# Patient Record
Sex: Male | Born: 1960 | Race: Black or African American | Hispanic: No | Marital: Single | Smoking: Current every day smoker
Health system: Southern US, Community
[De-identification: ages and names within clinical notes are randomized; demographics above are authoritative.]

## PROBLEM LIST (undated history)

## (undated) ENCOUNTER — Emergency Department: Payer: Medicaid Other | Source: Home / Self Care

## (undated) DIAGNOSIS — F0390 Unspecified dementia without behavioral disturbance: Secondary | ICD-10-CM

## (undated) DIAGNOSIS — I4891 Unspecified atrial fibrillation: Secondary | ICD-10-CM

## (undated) DIAGNOSIS — I10 Essential (primary) hypertension: Secondary | ICD-10-CM

## (undated) DIAGNOSIS — F1020 Alcohol dependence, uncomplicated: Secondary | ICD-10-CM

## (undated) DIAGNOSIS — B1920 Unspecified viral hepatitis C without hepatic coma: Secondary | ICD-10-CM

## (undated) DIAGNOSIS — R569 Unspecified convulsions: Secondary | ICD-10-CM

## (undated) DIAGNOSIS — G609 Hereditary and idiopathic neuropathy, unspecified: Secondary | ICD-10-CM

## (undated) DIAGNOSIS — R269 Unspecified abnormalities of gait and mobility: Secondary | ICD-10-CM

## (undated) DIAGNOSIS — I729 Aneurysm of unspecified site: Secondary | ICD-10-CM

## (undated) HISTORY — DX: Unspecified viral hepatitis C without hepatic coma: B19.20

## (undated) HISTORY — PX: CEREBRAL ANEURYSM REPAIR: SHX164

## (undated) HISTORY — DX: Unspecified abnormalities of gait and mobility: R26.9

## (undated) HISTORY — DX: Essential (primary) hypertension: I10

## (undated) HISTORY — DX: Hereditary and idiopathic neuropathy, unspecified: G60.9

---

## 2005-03-04 ENCOUNTER — Emergency Department (HOSPITAL_COMMUNITY): Admission: EM | Admit: 2005-03-04 | Discharge: 2005-03-04 | Payer: Self-pay | Admitting: Emergency Medicine

## 2005-06-26 ENCOUNTER — Emergency Department (HOSPITAL_COMMUNITY): Admission: EM | Admit: 2005-06-26 | Discharge: 2005-06-27 | Payer: Self-pay | Admitting: Emergency Medicine

## 2005-10-25 ENCOUNTER — Inpatient Hospital Stay (HOSPITAL_COMMUNITY): Admission: EM | Admit: 2005-10-25 | Discharge: 2005-10-28 | Payer: Self-pay | Admitting: *Deleted

## 2005-10-25 ENCOUNTER — Ambulatory Visit: Payer: Self-pay | Admitting: *Deleted

## 2005-12-27 ENCOUNTER — Ambulatory Visit: Payer: Self-pay | Admitting: Family Medicine

## 2005-12-29 ENCOUNTER — Ambulatory Visit: Payer: Self-pay | Admitting: *Deleted

## 2006-01-10 ENCOUNTER — Ambulatory Visit: Payer: Self-pay | Admitting: Family Medicine

## 2006-01-26 ENCOUNTER — Ambulatory Visit: Payer: Self-pay | Admitting: Family Medicine

## 2006-02-02 ENCOUNTER — Ambulatory Visit: Payer: Self-pay | Admitting: Family Medicine

## 2006-12-06 ENCOUNTER — Encounter (INDEPENDENT_AMBULATORY_CARE_PROVIDER_SITE_OTHER): Payer: Self-pay | Admitting: *Deleted

## 2007-02-15 ENCOUNTER — Emergency Department (HOSPITAL_COMMUNITY): Admission: EM | Admit: 2007-02-15 | Discharge: 2007-02-15 | Payer: Self-pay | Admitting: Emergency Medicine

## 2007-02-27 ENCOUNTER — Ambulatory Visit: Payer: Self-pay | Admitting: Family Medicine

## 2007-04-02 ENCOUNTER — Ambulatory Visit: Payer: Self-pay | Admitting: Family Medicine

## 2007-04-02 LAB — CONVERTED CEMR LAB
ALT: 107 units/L — ABNORMAL HIGH (ref 0–53)
AST: 170 units/L — ABNORMAL HIGH (ref 0–37)
Albumin: 4.1 g/dL (ref 3.5–5.2)
Alkaline Phosphatase: 88 units/L (ref 39–117)
BUN: 9 mg/dL (ref 6–23)
Basophils Absolute: 0 10*3/uL (ref 0.0–0.1)
Basophils Relative: 1 % (ref 0–1)
Eosinophils Absolute: 0 10*3/uL (ref 0.0–0.7)
MCHC: 33.7 g/dL (ref 30.0–36.0)
MCV: 101.8 fL — ABNORMAL HIGH (ref 78.0–100.0)
Neutro Abs: 0.9 10*3/uL — ABNORMAL LOW (ref 1.7–7.7)
Neutrophils Relative %: 25 % — ABNORMAL LOW (ref 43–77)
Platelets: 187 10*3/uL (ref 150–400)
Potassium: 4.6 meq/L (ref 3.5–5.3)
RDW: 11.9 % (ref 11.5–15.5)
Sodium: 139 meq/L (ref 135–145)
Total Protein: 7.6 g/dL (ref 6.0–8.3)

## 2008-03-25 ENCOUNTER — Emergency Department (HOSPITAL_COMMUNITY): Admission: EM | Admit: 2008-03-25 | Discharge: 2008-03-26 | Payer: Self-pay | Admitting: Emergency Medicine

## 2008-04-02 ENCOUNTER — Emergency Department (HOSPITAL_COMMUNITY): Admission: EM | Admit: 2008-04-02 | Discharge: 2008-04-02 | Payer: Self-pay | Admitting: Family Medicine

## 2008-04-24 ENCOUNTER — Ambulatory Visit: Payer: Self-pay | Admitting: Family Medicine

## 2008-04-25 ENCOUNTER — Encounter (INDEPENDENT_AMBULATORY_CARE_PROVIDER_SITE_OTHER): Payer: Self-pay | Admitting: Family Medicine

## 2008-04-25 LAB — CONVERTED CEMR LAB
AST: 52 units/L — ABNORMAL HIGH (ref 0–37)
Alkaline Phosphatase: 64 units/L (ref 39–117)
BUN: 13 mg/dL (ref 6–23)
Basophils Relative: 0 % (ref 0–1)
Creatinine, Ser: 0.91 mg/dL (ref 0.40–1.50)
Eosinophils Absolute: 0 10*3/uL (ref 0.0–0.7)
MCHC: 33.3 g/dL (ref 30.0–36.0)
MCV: 100.4 fL — ABNORMAL HIGH (ref 78.0–100.0)
Monocytes Absolute: 0.5 10*3/uL (ref 0.1–1.0)
Monocytes Relative: 11 % (ref 3–12)
Neutrophils Relative %: 26 % — ABNORMAL LOW (ref 43–77)
Potassium: 4.2 meq/L (ref 3.5–5.3)
RBC: 4.7 M/uL (ref 4.22–5.81)
RDW: 12.6 % (ref 11.5–15.5)

## 2008-08-21 ENCOUNTER — Ambulatory Visit: Payer: Self-pay | Admitting: Family Medicine

## 2008-08-25 ENCOUNTER — Ambulatory Visit (HOSPITAL_COMMUNITY): Admission: RE | Admit: 2008-08-25 | Discharge: 2008-08-25 | Payer: Self-pay | Admitting: Family Medicine

## 2009-01-23 ENCOUNTER — Ambulatory Visit: Payer: Self-pay | Admitting: Family Medicine

## 2009-01-23 LAB — CONVERTED CEMR LAB
Albumin: 4.2 g/dL (ref 3.5–5.2)
CO2: 24 meq/L (ref 19–32)
Carbamazepine Lvl: 12.4 ug/mL — ABNORMAL HIGH (ref 4.0–12.0)
Glucose, Bld: 115 mg/dL — ABNORMAL HIGH (ref 70–99)
Potassium: 4.2 meq/L (ref 3.5–5.3)
Sodium: 139 meq/L (ref 135–145)
Total Protein: 7.1 g/dL (ref 6.0–8.3)

## 2009-03-06 ENCOUNTER — Ambulatory Visit: Payer: Self-pay | Admitting: Family Medicine

## 2009-04-02 ENCOUNTER — Ambulatory Visit: Payer: Self-pay | Admitting: Internal Medicine

## 2009-04-30 ENCOUNTER — Ambulatory Visit: Payer: Self-pay | Admitting: Family Medicine

## 2009-04-30 LAB — CONVERTED CEMR LAB
Albumin: 3.9 g/dL (ref 3.5–5.2)
Carbamazepine Lvl: 10.9 ug/mL (ref 4.0–12.0)
Total Bilirubin: 0.3 mg/dL (ref 0.3–1.2)

## 2009-05-14 ENCOUNTER — Ambulatory Visit: Payer: Self-pay | Admitting: Family Medicine

## 2009-07-16 ENCOUNTER — Ambulatory Visit: Payer: Self-pay | Admitting: Family Medicine

## 2009-09-22 ENCOUNTER — Ambulatory Visit: Payer: Self-pay | Admitting: Family Medicine

## 2009-09-22 LAB — CONVERTED CEMR LAB
ALT: 109 units/L — ABNORMAL HIGH (ref 0–53)
AST: 230 units/L — ABNORMAL HIGH (ref 0–37)
Alkaline Phosphatase: 106 units/L (ref 39–117)
Basophils Relative: 0 % (ref 0–1)
CO2: 22 meq/L (ref 19–32)
Carbamazepine Lvl: 8 ug/mL (ref 4.0–12.0)
Lymphs Abs: 2.2 10*3/uL (ref 0.7–4.0)
MCHC: 32.7 g/dL (ref 30.0–36.0)
Monocytes Relative: 10 % (ref 3–12)
Neutro Abs: 1.2 10*3/uL — ABNORMAL LOW (ref 1.7–7.7)
Neutrophils Relative %: 31 % — ABNORMAL LOW (ref 43–77)
RBC: 3.75 M/uL — ABNORMAL LOW (ref 4.22–5.81)
Sodium: 142 meq/L (ref 135–145)
Total Bilirubin: 1.2 mg/dL (ref 0.3–1.2)
Total Protein: 7.7 g/dL (ref 6.0–8.3)
WBC: 3.8 10*3/uL — ABNORMAL LOW (ref 4.0–10.5)

## 2009-11-24 ENCOUNTER — Ambulatory Visit: Payer: Self-pay | Admitting: Family Medicine

## 2009-11-24 LAB — CONVERTED CEMR LAB
ALT: 74 units/L — ABNORMAL HIGH (ref 0–53)
Alkaline Phosphatase: 102 units/L (ref 39–117)
Basophils Absolute: 0 10*3/uL (ref 0.0–0.1)
Basophils Relative: 0 % (ref 0–1)
Creatinine, Ser: 0.81 mg/dL (ref 0.40–1.50)
Eosinophils Absolute: 0 10*3/uL (ref 0.0–0.7)
Eosinophils Relative: 1 % (ref 0–5)
HCT: 42.1 % (ref 39.0–52.0)
MCHC: 33.3 g/dL (ref 30.0–36.0)
MCV: 111.1 fL — ABNORMAL HIGH (ref 78.0–100.0)
RDW: 12.4 % (ref 11.5–15.5)
Sodium: 139 meq/L (ref 135–145)
Total Bilirubin: 1.2 mg/dL (ref 0.3–1.2)
Total Protein: 8.1 g/dL (ref 6.0–8.3)

## 2009-12-07 ENCOUNTER — Ambulatory Visit (HOSPITAL_COMMUNITY): Admission: RE | Admit: 2009-12-07 | Discharge: 2009-12-07 | Payer: Self-pay | Admitting: Family Medicine

## 2009-12-10 ENCOUNTER — Encounter (INDEPENDENT_AMBULATORY_CARE_PROVIDER_SITE_OTHER): Payer: Self-pay | Admitting: Family Medicine

## 2009-12-10 LAB — CONVERTED CEMR LAB
Basophils Relative: 0 % (ref 0–1)
Eosinophils Absolute: 0 10*3/uL (ref 0.0–0.7)
Eosinophils Relative: 0 % (ref 0–5)
HCT: 37.8 % — ABNORMAL LOW (ref 39.0–52.0)
HCV Quantitative: 9700000 intl units/mL — ABNORMAL HIGH (ref <43)
MCHC: 33.9 g/dL (ref 30.0–36.0)
MCV: 108 fL — ABNORMAL HIGH (ref 78.0–100.0)
Monocytes Relative: 8 % (ref 3–12)
Neutrophils Relative %: 47 % (ref 43–77)
Platelets: 79 10*3/uL — ABNORMAL LOW (ref 150–400)

## 2010-01-05 ENCOUNTER — Ambulatory Visit (HOSPITAL_COMMUNITY): Admission: RE | Admit: 2010-01-05 | Discharge: 2010-01-05 | Payer: Self-pay | Admitting: Family Medicine

## 2010-03-09 ENCOUNTER — Encounter (INDEPENDENT_AMBULATORY_CARE_PROVIDER_SITE_OTHER): Payer: Self-pay | Admitting: Family Medicine

## 2010-03-09 LAB — CONVERTED CEMR LAB
Basophils Absolute: 0 10*3/uL (ref 0.0–0.1)
Basophils Relative: 0 % (ref 0–1)
Eosinophils Relative: 1 % (ref 0–5)
HCT: 39.5 % (ref 39.0–52.0)
Hemoglobin: 13.3 g/dL (ref 13.0–17.0)
Lymphocytes Relative: 55 % — ABNORMAL HIGH (ref 12–46)
MCHC: 33.7 g/dL (ref 30.0–36.0)
Monocytes Absolute: 0.6 10*3/uL (ref 0.1–1.0)
Platelets: 98 10*3/uL — ABNORMAL LOW (ref 150–400)
RDW: 12.1 % (ref 11.5–15.5)

## 2010-04-15 ENCOUNTER — Ambulatory Visit: Admit: 2010-04-15 | Payer: Self-pay | Admitting: Gastroenterology

## 2010-05-21 ENCOUNTER — Encounter (INDEPENDENT_AMBULATORY_CARE_PROVIDER_SITE_OTHER): Payer: Self-pay | Admitting: Family Medicine

## 2010-05-21 LAB — CONVERTED CEMR LAB
ALT: 31 units/L (ref 0–53)
AST: 34 units/L (ref 0–37)
Basophils Absolute: 0.1 10*3/uL (ref 0.0–0.1)
Basophils Relative: 1 % (ref 0–1)
CO2: 18 meq/L — ABNORMAL LOW (ref 19–32)
Calcium: 9 mg/dL (ref 8.4–10.5)
Carbamazepine Lvl: 10.7 ug/mL (ref 4.0–12.0)
Chloride: 98 meq/L (ref 96–112)
Creatinine, Ser: 1.06 mg/dL (ref 0.40–1.50)
Hemoglobin: 18 g/dL — ABNORMAL HIGH (ref 13.0–17.0)
Lymphocytes Relative: 36 % (ref 12–46)
MCHC: 36.6 g/dL — ABNORMAL HIGH (ref 30.0–36.0)
Monocytes Absolute: 0.7 10*3/uL (ref 0.1–1.0)
Neutro Abs: 5.7 10*3/uL (ref 1.7–7.7)
Neutrophils Relative %: 57 % (ref 43–77)
Platelets: 130 10*3/uL — ABNORMAL LOW (ref 150–400)
Potassium: 4.2 meq/L (ref 3.5–5.3)
RDW: 12.2 % (ref 11.5–15.5)
Sodium: 133 meq/L — ABNORMAL LOW (ref 135–145)
Total Protein: 8.3 g/dL (ref 6.0–8.3)
Vit D, 25-Hydroxy: 22 ng/mL — ABNORMAL LOW (ref 30–89)

## 2010-07-05 LAB — POCT I-STAT, CHEM 8
Calcium, Ion: 1.12 mmol/L (ref 1.12–1.32)
Creatinine, Ser: 0.9 mg/dL (ref 0.4–1.5)
Glucose, Bld: 83 mg/dL (ref 70–99)
Hemoglobin: 14.6 g/dL (ref 13.0–17.0)
Potassium: 3.8 mEq/L (ref 3.5–5.1)

## 2010-08-06 NOTE — Discharge Summary (Signed)
NAME:  ROBERTA, KELLY NO.:  [id number]   MEDICAL RECORD NO.:  [id number]          PATIENT TYPE:  IPS   LOCATION:  0304                          FACILITY:  [facility]   PHYSICIAN:  Jasmine Pang, M.D. DATE OF BIRTH:  1960-10-02   DATE OF ADMISSION:  10/25/2005  DATE OF DISCHARGE:  10/28/2005                                 DISCHARGE SUMMARY   IDENTIFICATION:  The patient was a 50 year old African-American male who was  admitted on a voluntary basis.   HISTORY OF PRESENT ILLNESS:  The patient has a history of polysubstance  abuse and requests detox from alcohol.  He is drinking several quarts of  alcohol daily.  He has impaired ADLs and no motivation for working or other  goals.  He has been using marijuana since the age of 42 and drinking alcohol  since the age of 38, cocaine since his 21s.  He is using cocaine IV.  He had  a seizure last Saturday secondary to not taking his Tegretol.  He denies  suicidal or homicidal ideation.  He denies hallucinations or thought  problems.  This is the first [facility] admission for this patient.  He has a  history of detox in the 84s.  He has been clean and sober for 45 months  while in prison.  For further admission information see psychiatric  admission assessment.   PHYSICAL EXAMINATION:  Physical examination was unremarkable.  This was done  by Lynann Bologna, our nurse practitioner.   LABORATORY DATA:  CBC revealed a WBC of 6.7, hemoglobin of 13, hematocrit of  38.7, platelet count of 197.  Sodium was 134, potassium 4, chloride 102, CO2  28, BUN 14, creatinine 0.9, glucose 96.   HOSPITAL COURSE:  Upon admission, the patient was started on the Librium  detox protocol.  He was also continued on Tegretol 600 mg p.o. b.i.d.  He  was given a nicotine patch 21 mg daily.  He was ordered Symmetrel 100 mg  p.o. b.i.d. due to cocaine use.  The patient tolerated these medications  well with no significant side effects.   Upon admission  the patient stated he was here because he used cocaine and  alcohol.  He had not used for 45 months then relapsed.  He felt depressed  and discouraged about this relapse.  He has had a history of brain  aneurysm approximately 13 years ago.  He has had a seizure disorder since  then and is currently on Tegretol 600 mg p.o. b.i.d.  On October 26, 2005 the  patient discussed his relapse after being 45 months sober.  He began to  drink again and the monster was worse than it ever was.  He states he  ended up being homeless and decided he wanted to be sober again and regain  control of his life.  He is currently homeless.  On October 27, 2005, the  patient was very groggy but had improved mental status.  He discussed his  family.  There is a lot of conflict between family members.  One sister  would not let him come to her  cookout, that is rotten.  That hurt.  He  states he has nieces and nephews that he has not seen.  He feels this has  driven him to addiction.  The plan was for [facility] to [facility]  [place].   On October 28, 2005, the day of discharge, the patient's mental status had  improved markedly.  He was less depressed and anxious.  Affect was wider  range.  There was no suicidal or homicidal ideation.  There was no auditory  or visual hallucinations, no paranoia or delusions.  Thoughts were logical  and goal directed.  Thought content no predominant theme.  Cognitive exam  was grossly within normal limits.   DISCHARGE DIAGNOSES:  AXIS I:  Polysubstance dependence.  AXIS II:  None.  AXIS III:  No diagnosis.  AXIS IV:  Severe (homeless).  AXIS V.  Global assessment of functioning on discharge was 45, global  assessment of functioning upon admission was 38, global assessment of  functioning highest past year was 64.   DISCHARGE PLANS:  The patient had no specific activity level or dietary  restrictions.   DISCHARGE MEDICATIONS:  1. Symmetrel 100 mg p.o. b.i.d.  2. Tegretol  200 mg 3 tablets twice a day.  Tegretol level was 4.6 on      October 25, 2005.   POST HOSPITAL CARE PLAN:  He will follow up with Urgent Care or [facility] within 2 weeks for his seizure disorder.  [facility]:  An appointment has been made for Tuesday, October 28, 2005 at 2 p.m.      Jasmine Pang, M.D.  Electronically Signed     BHS/MEDQ  D:  11/10/2005  T:  11/10/2005  Job:  [id number]

## 2011-07-28 ENCOUNTER — Encounter (HOSPITAL_COMMUNITY): Payer: Self-pay | Admitting: Emergency Medicine

## 2011-07-28 ENCOUNTER — Emergency Department (HOSPITAL_COMMUNITY)
Admission: EM | Admit: 2011-07-28 | Discharge: 2011-07-29 | Disposition: A | Discharge status: Home / Self Care | Payer: Medicaid Other | Attending: Emergency Medicine | Admitting: Emergency Medicine

## 2011-07-28 DIAGNOSIS — G40909 Epilepsy, unspecified, not intractable, without status epilepticus: Secondary | ICD-10-CM

## 2011-07-28 DIAGNOSIS — F1010 Alcohol abuse, uncomplicated: Secondary | ICD-10-CM | POA: Insufficient documentation

## 2011-07-28 DIAGNOSIS — F10929 Alcohol use, unspecified with intoxication, unspecified: Secondary | ICD-10-CM

## 2011-07-28 DIAGNOSIS — R569 Unspecified convulsions: Secondary | ICD-10-CM | POA: Insufficient documentation

## 2011-07-28 HISTORY — DX: Unspecified convulsions: R56.9

## 2011-07-28 HISTORY — DX: Alcohol dependence, uncomplicated: F10.20

## 2011-07-28 LAB — RAPID URINE DRUG SCREEN, HOSP PERFORMED
Amphetamines: NOT DETECTED
Benzodiazepines: NOT DETECTED
Cocaine: NOT DETECTED
Opiates: NOT DETECTED
Tetrahydrocannabinol: NOT DETECTED

## 2011-07-28 LAB — DIFFERENTIAL
Basophils Absolute: 0 10*3/uL (ref 0.0–0.1)
Basophils Relative: 1 % (ref 0–1)
Eosinophils Absolute: 0 10*3/uL (ref 0.0–0.7)
Monocytes Absolute: 0.4 10*3/uL (ref 0.1–1.0)
Monocytes Relative: 9 % (ref 3–12)
Neutrophils Relative %: 33 % — ABNORMAL LOW (ref 43–77)

## 2011-07-28 LAB — CBC
HCT: 35.1 % — ABNORMAL LOW (ref 39.0–52.0)
Hemoglobin: 12.3 g/dL — ABNORMAL LOW (ref 13.0–17.0)
MCH: 36.6 pg — ABNORMAL HIGH (ref 26.0–34.0)
MCHC: 35 g/dL (ref 30.0–36.0)
RDW: 12.3 % (ref 11.5–15.5)

## 2011-07-28 LAB — COMPREHENSIVE METABOLIC PANEL
Albumin: 3.3 g/dL — ABNORMAL LOW (ref 3.5–5.2)
BUN: 8 mg/dL (ref 6–23)
Creatinine, Ser: 0.68 mg/dL (ref 0.50–1.35)
Total Protein: 7.4 g/dL (ref 6.0–8.3)

## 2011-07-28 MED ORDER — THIAMINE HCL 100 MG/ML IJ SOLN: 100.0000 mg | Freq: Every day | INTRAMUSCULAR | Status: DC

## 2011-07-28 MED ORDER — ACETAMINOPHEN 325 MG PO TABS
650.0000 mg | ORAL_TABLET | Freq: Once | ORAL | Status: AC
  Administered 2011-07-28: 650 mg via ORAL
  Filled 2011-07-28: qty 2

## 2011-07-28 NOTE — ED Notes (Signed)
Per EMS, pt found in residential yard passed out, person that called EMS reported possible seizure. Pt was combative with EMS, refused VS and CBG.

## 2011-07-28 NOTE — ED Notes (Signed)
Pt refuses to allow RN to insert IV, starts to become agitated and states, I dont need that".

## 2011-07-28 NOTE — ED Notes (Signed)
Per EMS, pt found passed out in yard, person that called EMS reports ? Seizure. Pt was combative with EMS, refused to allow VS or CBG check. EMS found tegretal in pt possessions.

## 2011-07-28 NOTE — ED Notes (Signed)
EAV:[id number]<JX> Expected date:07/28/11<BR> Expected time:<BR> Means of arrival:<BR> Comments:<BR> EMS 80 GC - etoh/lethargy

## 2011-07-28 NOTE — ED Provider Notes (Signed)
History     CSN: [id number]  Arrival date & time 07/28/11  1654   First MD Initiated Contact with Patient 07/28/11 1655      Chief Complaint  Patient presents with  . Alcohol Intoxication  . Seizures    HPI The patient was found passed out in the yard. A bystander called EMS. Initially when EMS evaluated the patient he was somewhat combative and refused to allow vital signs or blood glucose to be checked. Patient states he's had 3 seizures today. He appears to be heavily intoxicated. Denies any headache, injuries, chest pain, abdominal pain, nausea or vomiting. Patient states he takes Tegretol. He states he has been taking his medications. Past Medical History  Diagnosis Date  . Seizure   . ETOHism     History reviewed. No pertinent past surgical history.  No family history on file.  History  Substance Use Topics  . Smoking status: Not on file  . Smokeless tobacco: Not on file  . Alcohol Use: Yes      Review of Systems  All other systems reviewed and are negative.    Allergies  Review of patient's allergies indicates no known allergies.  Home Medications  No current outpatient prescriptions on file.  There were no vitals taken for this visit.  Physical Exam  Nursing note and vitals reviewed. Constitutional: He appears well-developed and well-nourished. No distress.  HENT:  Head: Normocephalic and atraumatic.  Right Ear: External ear normal.  Left Ear: External ear normal.       Heavy EtOH odor  Eyes: Conjunctivae are normal. Right eye exhibits no discharge. Left eye exhibits no discharge. No scleral icterus.  Neck: Neck supple. No tracheal deviation present.  Cardiovascular: Normal rate, regular rhythm and intact distal pulses.   Pulmonary/Chest: Effort normal and breath sounds normal. No stridor. No respiratory distress. He has no wheezes. He has no rales.  Abdominal: Soft. Bowel sounds are normal. He exhibits no distension. There is no tenderness. There is  no rebound and no guarding.  Musculoskeletal: He exhibits no edema and no tenderness.  Neurological: He is alert. He has normal strength. No cranial nerve deficit ( no gross defecits noted) or sensory deficit. He exhibits normal muscle tone. He displays no seizure activity. Coordination abnormal.       Slurred speech  Skin: Skin is warm and dry. No rash noted.  Psychiatric: He has a normal mood and affect.    ED Course  Procedures (including critical care time)  Labs Reviewed  CBC - Abnormal; Notable for the following:    RBC 3.36 (*)    Hemoglobin 12.3 (*)    HCT 35.1 (*)    MCV 104.5 (*)    MCH 36.6 (*)    All other components within normal limits  DIFFERENTIAL - Abnormal; Notable for the following:    Neutrophils Relative 33 (*)    Neutro Abs 1.4 (*)    Lymphocytes Relative 58 (*)    All other components within normal limits  COMPREHENSIVE METABOLIC PANEL - Abnormal; Notable for the following:    Sodium 132 (*)    Glucose, Bld 110 (*)    Albumin 3.3 (*)    AST 174 (*)    ALT 86 (*)    All other components within normal limits  ETHANOL - Abnormal; Notable for the following:    Alcohol, Ethyl (B) 315 (*)    All other components within normal limits  GLUCOSE, CAPILLARY - Abnormal; Notable for the following:  Glucose-Capillary 116 (*)    All other components within normal limits  URINE RAPID DRUG SCREEN (HOSP PERFORMED)  CARBAMAZEPINE LEVEL, TOTAL   No results found.   1. Alcohol intoxication   2. Seizure disorder       MDM  Patient has history of seizure disorder. His Tegretol level is therapeutic. It is unclear if the patient truly had a seizure or or just passed out from his alcohol intoxication. Regardless he is remained stable here in the emergency department. He does not appear postictal. We will continue to monitor him until he sobers up.  Pt appears to be improving.  Was able to ambulate.       Celene Kras, MD 07/28/11 [phone]

## 2011-07-28 NOTE — Discharge Instructions (Signed)
Alcohol Intoxication Alcohol intoxication means your blood alcohol level is above legal limits. Alcohol is a drug. It has serious side effects. These side effects can include:  Damage to your organs (liver, nervous system, and blood system).   Unclear thinking.   Slowed reflexes.   Decreased muscle coordination.  HOME CARE  Do not drink and drive.   Do not drink alcohol if you are taking medicine or using other drugs. Doing so can cause serious medical problems or even death.   Drink enough water and fluids to keep your pee (urine) clear or pale yellow.   Eat healthy foods.   Only take medicine as told by your doctor.   Join an alcohol support group.  GET HELP RIGHT AWAY IF:  You become shaky when you stop drinking.   Your thinking is unclear or you become confused.   You throw up (vomit) blood. It may look bright red or like coffee grounds.   You notice blood in your poop (bowel movements).   You become lightheaded or pass out (faint).  MAKE SURE YOU:   Understand these instructions.   Will watch your condition.   Will get help right away if you are not doing well or get worse.  Document Released: 08/24/2007 Document Revised: 02/24/2011 Document Reviewed: 08/24/2009 [facility] Patient Information 2012 [place], [place].Seizures You had a seizure. About 2% of the population will have a seizure problem during their lifetime. Sometimes the cause for the seizure is not known. Seizures are usually associated with one of these problems:  Epilepsy.   Not taking your seizure medicine.   Alcohol and drug abuse.   Head injury, strokes, tumors, and brain surgery.   High fever and infections.   Low blood sugar.  Evaluating a new seizure disorder may require having a brain scan or a brain wave test called an EEG. If you have been given a seizure medicine, it is very important that you take it as prescribed. Not taking these medicines as directed is the most common cause of  seizures. Blood tests are often used to be sure you are taking the proper dose.  Seizures cause many different symptoms, from convulsions to brief blackouts. Do not ride a bike, drive a car, go swimming, climb in high or dangerous places such as ladders or roofs, or operate any dangerous equipment until you have your doctor's permission. If you hold a driver's license, state law may require that a report be made to the motor vehicles department. You should wear an emergency medical identification bracelet with information about your seizures. If you have any warning that a seizure may occur, lie down in a safe place to protect yourself. Teach your family and friends what to do if you have any further seizures. They should stay calm and try to keep you from falling on hard or sharp objects. It is best not to try to restrain a seizing person or to force anything into his or her mouth. Do not try to open clenched jaws. When the seizure is over, the person should be rolled on their side to help drain any vomit or secretions from the mouth. After a seizure, a person may be confused or drowsy for several minutes. An ambulance should be called if the seizure lasted more than 5 minutes or if confusion remains for more than 30 minutes. Call your caregiver or the emergency department for further instructions. Do not drive until cleared by your caregiver or neurologist! Document Released: 04/14/2004 Document Revised:  11/17/2010 Document Reviewed: 03/07/2005 [facility] Patient Information 2012 [place], [place].

## 2011-07-28 NOTE — ED Notes (Signed)
Pt refused to allow VS check, pushes RN away.

## 2011-07-29 NOTE — ED Notes (Signed)
Pt given all of his belongings. Stated that he would wait in waiting room till morning for bus.

## 2011-10-07 ENCOUNTER — Emergency Department (HOSPITAL_COMMUNITY)
Admission: EM | Admit: 2011-10-07 | Discharge: 2011-10-08 | Disposition: A | Discharge status: Home / Self Care | Payer: Medicaid Other | Attending: Emergency Medicine | Admitting: Emergency Medicine

## 2011-10-07 ENCOUNTER — Encounter (HOSPITAL_COMMUNITY): Payer: Self-pay | Admitting: *Deleted

## 2011-10-07 DIAGNOSIS — R569 Unspecified convulsions: Secondary | ICD-10-CM

## 2011-10-07 DIAGNOSIS — F10929 Alcohol use, unspecified with intoxication, unspecified: Secondary | ICD-10-CM

## 2011-10-07 DIAGNOSIS — F10229 Alcohol dependence with intoxication, unspecified: Secondary | ICD-10-CM | POA: Insufficient documentation

## 2011-10-07 DIAGNOSIS — G40909 Epilepsy, unspecified, not intractable, without status epilepticus: Secondary | ICD-10-CM | POA: Insufficient documentation

## 2011-10-07 LAB — CBC WITH DIFFERENTIAL/PLATELET
Basophils Absolute: 0 10*3/uL (ref 0.0–0.1)
Eosinophils Absolute: 0 10*3/uL (ref 0.0–0.7)
HCT: 32.7 % — ABNORMAL LOW (ref 39.0–52.0)
Lymphocytes Relative: 65 % — ABNORMAL HIGH (ref 12–46)
MCHC: 34.3 g/dL (ref 30.0–36.0)
Monocytes Relative: 6 % (ref 3–12)
Neutro Abs: 1.3 10*3/uL — ABNORMAL LOW (ref 1.7–7.7)
Neutrophils Relative %: 29 % — ABNORMAL LOW (ref 43–77)
Platelets: 35 10*3/uL — ABNORMAL LOW (ref 150–400)
RDW: 13.3 % (ref 11.5–15.5)
WBC: 4.6 10*3/uL (ref 4.0–10.5)

## 2011-10-07 LAB — BASIC METABOLIC PANEL
Chloride: 100 mEq/L (ref 96–112)
GFR calc Af Amer: >90 mL/min (ref >90)
GFR calc non Af Amer: >90 mL/min (ref >90)
Potassium: 3.9 mEq/L (ref 3.5–5.1)
Sodium: 135 mEq/L (ref 135–145)

## 2011-10-07 LAB — URINALYSIS, ROUTINE W REFLEX MICROSCOPIC
Bilirubin Urine: NEGATIVE
Leukocytes, UA: NEGATIVE
Nitrite: NEGATIVE
Specific Gravity, Urine: 1.015 (ref 1.005–1.030)
Urobilinogen, UA: 2 mg/dL — ABNORMAL HIGH (ref 0.0–1.0)
pH: 5.5 (ref 5.0–8.0)

## 2011-10-07 LAB — RAPID URINE DRUG SCREEN, HOSP PERFORMED
Cocaine: NOT DETECTED
Opiates: NOT DETECTED
Tetrahydrocannabinol: NOT DETECTED

## 2011-10-07 LAB — CARBAMAZEPINE LEVEL, TOTAL: Carbamazepine Lvl: <0.5 ug/mL — ABNORMAL LOW (ref 4.0–12.0)

## 2011-10-07 MED ORDER — SODIUM CHLORIDE 0.9 % IV BOLUS (SEPSIS)
1000.0000 mL | Freq: Once | INTRAVENOUS | Status: AC
  Administered 2011-10-07: 1000 mL via INTRAVENOUS

## 2011-10-07 MED ORDER — CARBAMAZEPINE 200 MG PO TABS
400.0000 mg | ORAL_TABLET | Freq: Once | ORAL | Status: AC
  Administered 2011-10-07: 400 mg via ORAL
  Filled 2011-10-07: qty 2

## 2011-10-07 NOTE — ED Notes (Signed)
Pt had a needle in pants pocket,  Pt was in bathroom smoking,  Education officer, environmental nurse aware and at bedside speaking with pt,  Security also at bedside,  Pt continues to loud and rude to staff.  Pt's belongings taken and placed in bag at bedside pt is aware belongings at bedside.  Pt states he melts his medication and takes it with a needle.

## 2011-10-07 NOTE — ED Notes (Signed)
WUJ:[id number]<JY> Expected date:10/07/11<BR> Expected time: 8:52 PM<BR> Means of arrival:Ambulance<BR> Comments:<BR> Seizure; etoh

## 2011-10-07 NOTE — ED Notes (Signed)
Pt ambulated to bathroom without assistance 

## 2011-10-07 NOTE — ED Notes (Signed)
Per ems pt homeless; history of seizure; not taking tegretol. Pt sitting on bench; found by [employer] laying on ground; pt told [employer] he had had a seizure.  On arrival pt gait unsteady, alert and oriented, speech slurred--pt joking, pleasant and cooperative with staff. No post ictal state witnessed by [employer] or ems.

## 2011-10-07 NOTE — ED Provider Notes (Signed)
History     CSN: [id number]  Arrival date & time 10/07/11  2054   First MD Initiated Contact with Patient 10/07/11 2113      Chief Complaint  Patient presents with  . Seizures    HPI  History provided by the patient. Patient is a 51 year old male with history of alcohol abuse and seizures who presents with reports of seizures early in the day. Patient reports having 2 seizures today. First seizure occurred early in the morning second seizure was later this afternoon and evening. Patient was brought to emergency room by EMS after a phone call. Patient is currently homeless and was in a public bench when [employer] found the patient. There were no reports of postictal confusion. Patient does admit to alcohol use earlier today. Patient states he is normally on Tegretol but has not taken this in the past few days. Patient denies any other complaints at this time. He denies any pain or injury from a fall. Patient states that he commonly has seizures during the day and he normally does not like to come to the emergency room for these. Patient is not have any primary care or neurology followup.    Past Medical History  Diagnosis Date  . Seizure   . ETOHism     History reviewed. No pertinent past surgical history.  No family history on file.  History  Substance Use Topics  . Smoking status: Not on file  . Smokeless tobacco: Not on file  . Alcohol Use: Yes      Review of Systems  Constitutional: Negative for fever and chills.  Respiratory: Negative for cough and shortness of breath.   Cardiovascular: Negative for chest pain.  Gastrointestinal: Negative for nausea, vomiting and abdominal pain.  Neurological: Negative for dizziness, light-headedness and headaches.    Allergies  Review of patient's allergies indicates no known allergies.  Home Medications  No current outpatient prescriptions on file.  BP 143/88  Pulse 76  Temp 97.8 F (36.6 C) (Oral)  Resp 18  SpO2  97%  Physical Exam  Nursing note and vitals reviewed. Constitutional: He is oriented to person, place, and time. He appears well-developed and well-nourished. No distress.  HENT:  Head: Normocephalic and atraumatic.       No bite marks to the  Eyes: Conjunctivae and EOM are normal. Pupils are equal, round, and reactive to light.  Cardiovascular: Normal rate and regular rhythm.   Pulmonary/Chest: Effort normal and breath sounds normal.  Abdominal: Soft. There is no tenderness.  Neurological: He is alert and oriented to person, place, and time. He has normal strength. No sensory deficit.  Skin: Skin is warm.  Psychiatric: He has a normal mood and affect. His behavior is normal.    ED Course  Procedures   Results for orders placed during the hospital encounter of 10/07/11  CARBAMAZEPINE LEVEL, TOTAL      Component Value Range   Carbamazepine Lvl <0.5 (*) 4.0 - 12.0 ug/mL  CBC WITH DIFFERENTIAL      Component Value Range   WBC 4.6  4.0 - 10.5 K/uL   RBC 3.02 (*) 4.22 - 5.81 MIL/uL   Hemoglobin 11.2 (*) 13.0 - 17.0 g/dL   HCT 40.9 (*) 81.1 - 91.4 %   MCV 108.3 (*) 78.0 - 100.0 fL   MCH 37.1 (*) 26.0 - 34.0 pg   MCHC 34.3  30.0 - 36.0 g/dL   RDW 78.2  95.6 - 21.3 %   Platelets 35 (*) 150 -  400 K/uL   Neutrophils Relative 29 (*) 43 - 77 %   Lymphocytes Relative 65 (*) 12 - 46 %   Monocytes Relative 6  3 - 12 %   Eosinophils Relative 0  0 - 5 %   Basophils Relative 0  0 - 1 %   Neutro Abs 1.3 (*) 1.7 - 7.7 K/uL   Lymphs Abs 3.0  0.7 - 4.0 K/uL   Monocytes Absolute 0.3  0.1 - 1.0 K/uL   Eosinophils Absolute 0.0  0.0 - 0.7 K/uL   Basophils Absolute 0.0  0.0 - 0.1 K/uL   Smear Review MORPHOLOGY UNREMARKABLE    BASIC METABOLIC PANEL      Component Value Range   Sodium 135  135 - 145 mEq/L   Potassium 3.9  3.5 - 5.1 mEq/L   Chloride 100  96 - 112 mEq/L   CO2 24  19 - 32 mEq/L   Glucose, Bld 103 (*) 70 - 99 mg/dL   BUN 7  6 - 23 mg/dL   Creatinine, Ser 4.09  0.50 - 1.35 mg/dL    Calcium 8.6  8.4 - 81.1 mg/dL   GFR calc non Af Amer >90  >90 mL/min   GFR calc Af Amer >90  >90 mL/min  URINALYSIS, ROUTINE W REFLEX MICROSCOPIC      Component Value Range   Color, Urine YELLOW  YELLOW   APPearance CLEAR  CLEAR   Specific Gravity, Urine 1.015  1.005 - 1.030   pH 5.5  5.0 - 8.0   Glucose, UA NEGATIVE  NEGATIVE mg/dL   Hgb urine dipstick NEGATIVE  NEGATIVE   Bilirubin Urine NEGATIVE  NEGATIVE   Ketones, ur NEGATIVE  NEGATIVE mg/dL   Protein, ur NEGATIVE  NEGATIVE mg/dL   Urobilinogen, UA 2.0 (*) 0.0 - 1.0 mg/dL   Nitrite NEGATIVE  NEGATIVE   Leukocytes, UA NEGATIVE  NEGATIVE  ETHANOL      Component Value Range   Alcohol, Ethyl (B) 286 (*) 0 - 11 mg/dL  URINE RAPID DRUG SCREEN (HOSP PERFORMED)      Component Value Range   Opiates NONE DETECTED  NONE DETECTED   Cocaine NONE DETECTED  NONE DETECTED   Benzodiazepines NONE DETECTED  NONE DETECTED   Amphetamines NONE DETECTED  NONE DETECTED   Tetrahydrocannabinol NONE DETECTED  NONE DETECTED   Barbiturates NONE DETECTED  NONE DETECTED        1. Seizure   2. Alcohol intoxication       MDM  9:20PM patient seen and evaluated. Patient no acute distress. Patient alert and oriented x3.  Patient observed through the night. No additional seizures. Patient is ambulatory with normal gait. No signs for clinical intoxication at this time. Patient given doses of Tegretol and a prescription to continue medications. Will discharge at this time.      Angus Seller, [place] 10/08/11 [phone]

## 2011-10-07 NOTE — ED Notes (Signed)
Pt states he wants a hot meal and was told he could have one,  I advised pt we had sandwiches and I would ask MD if ok,  Pt stated "that was ridiculous"

## 2011-10-08 MED ORDER — CARBAMAZEPINE ER 200 MG PO TB12: 400.0000 mg | ORAL_TABLET | Freq: Two times a day (BID) | ORAL | Status: DC

## 2011-10-08 MED ORDER — CARBAMAZEPINE 200 MG PO TABS
400.0000 mg | ORAL_TABLET | Freq: Once | ORAL | Status: AC
  Administered 2011-10-08: 400 mg via ORAL
  Filled 2011-10-08: qty 2

## 2011-10-08 MED ORDER — CARBAMAZEPINE ER 200 MG PO TB12: 200.0000 mg | ORAL_TABLET | Freq: Two times a day (BID) | ORAL | Status: DC

## 2011-10-09 NOTE — ED Provider Notes (Signed)
Medical screening examination/treatment/procedure(s) were performed by non-physician practitioner and as supervising physician I was immediately available for consultation/collaboration.   Kyler Lerette, MD 10/09/11 0011 

## 2011-10-18 ENCOUNTER — Encounter (HOSPITAL_COMMUNITY): Payer: Self-pay | Admitting: Emergency Medicine

## 2011-10-18 ENCOUNTER — Emergency Department (HOSPITAL_COMMUNITY)
Admission: EM | Admit: 2011-10-18 | Discharge: 2011-10-19 | Disposition: A | Discharge status: Home / Self Care | Payer: Medicaid Other | Attending: Emergency Medicine | Admitting: Emergency Medicine

## 2011-10-18 DIAGNOSIS — F1010 Alcohol abuse, uncomplicated: Secondary | ICD-10-CM

## 2011-10-18 DIAGNOSIS — D696 Thrombocytopenia, unspecified: Secondary | ICD-10-CM

## 2011-10-18 DIAGNOSIS — G40909 Epilepsy, unspecified, not intractable, without status epilepticus: Secondary | ICD-10-CM | POA: Insufficient documentation

## 2011-10-18 LAB — HEPATIC FUNCTION PANEL
ALT: 55 U/L — ABNORMAL HIGH (ref 0–53)
AST: 139 U/L — ABNORMAL HIGH (ref 0–37)
Albumin: 3.1 g/dL — ABNORMAL LOW (ref 3.5–5.2)
Alkaline Phosphatase: 105 U/L (ref 39–117)
Total Bilirubin: 0.6 mg/dL (ref 0.3–1.2)
Total Protein: 7.1 g/dL (ref 6.0–8.3)

## 2011-10-18 LAB — GLUCOSE, CAPILLARY

## 2011-10-18 LAB — URINALYSIS, ROUTINE W REFLEX MICROSCOPIC
Hgb urine dipstick: NEGATIVE
Nitrite: NEGATIVE
Protein, ur: NEGATIVE mg/dL
Specific Gravity, Urine: 1.015 (ref 1.005–1.030)
Urobilinogen, UA: >8 mg/dL — ABNORMAL HIGH (ref 0.0–1.0)

## 2011-10-18 LAB — CBC
Hemoglobin: 10.5 g/dL — ABNORMAL LOW (ref 13.0–17.0)
Platelets: 28 10*3/uL — CL (ref 150–400)
RBC: 2.75 MIL/uL — ABNORMAL LOW (ref 4.22–5.81)
WBC: 6 10*3/uL (ref 4.0–10.5)

## 2011-10-18 LAB — BASIC METABOLIC PANEL
CO2: 25 mEq/L (ref 19–32)
Chloride: 101 mEq/L (ref 96–112)
Glucose, Bld: 87 mg/dL (ref 70–99)
Potassium: 3.7 mEq/L (ref 3.5–5.1)
Sodium: 136 mEq/L (ref 135–145)

## 2011-10-18 LAB — RAPID URINE DRUG SCREEN, HOSP PERFORMED
Barbiturates: NOT DETECTED
Cocaine: NOT DETECTED
Tetrahydrocannabinol: NOT DETECTED

## 2011-10-18 LAB — PROTIME-INR: Prothrombin Time: 16.9 seconds — ABNORMAL HIGH (ref 11.6–15.2)

## 2011-10-18 LAB — ETHANOL: Alcohol, Ethyl (B): 326 mg/dL — ABNORMAL HIGH (ref 0–11)

## 2011-10-18 MED ORDER — CARBAMAZEPINE ER 400 MG PO TB12
400.0000 mg | ORAL_TABLET | Freq: Three times a day (TID) | ORAL | Status: DC
  Administered 2011-10-18: 400 mg via ORAL
  Filled 2011-10-18 (×4): qty 1

## 2011-10-18 MED ORDER — THIAMINE HCL 100 MG/ML IJ SOLN
Freq: Once | INTRAVENOUS | Status: AC
  Administered 2011-10-18: 22:00:00 via INTRAVENOUS
  Filled 2011-10-18: qty 1000

## 2011-10-18 MED ORDER — SODIUM CHLORIDE 0.9 % IV SOLN
Freq: Once | INTRAVENOUS | Status: AC
  Administered 2011-10-18: 20:00:00 via INTRAVENOUS

## 2011-10-18 NOTE — ED Provider Notes (Signed)
Medical screening examination/treatment/procedure(s) were conducted as a shared visit with non-physician practitioner(s) and myself.  I personally evaluated the patient during the encounter Alcoholic. Hx of sz.  Supposed to take tegretol but doesn't. Had 3 sz today.  Normal now except very drunk.  Will monitor and load with tegretol.  plts low, but since alcoholic plt transfusion not likely to benefit.  Cheri Guppy, MD 10/18/11 2303

## 2011-10-18 NOTE — ED Notes (Signed)
To ED via [facility], hx seizures, had 3-4 seizures today per EMS and patient. Homeless. Unkempt, strong body odor, incontinent of urine,

## 2011-10-18 NOTE — ED Notes (Signed)
Pt states he had another seizure. Urinated and had BM on self and bed. Changed patients linen and gown. Pt is now resting.

## 2011-10-18 NOTE — ED Provider Notes (Signed)
History     CSN: [id number]  Arrival date & time 10/18/11  1901   First MD Initiated Contact with Patient 10/18/11 2022      Chief Complaint  Patient presents with  . Seizures    (Consider location/radiation/quality/duration/timing/severity/associated sxs/prior treatment) HPI  Patient with hx of alcohol abuse and seizure disorder who is poorly compliant to his medications but continues to abuse alcohol and is homeless presents to ER by EMS with report of multiple seizures today. Patient has been seen recently in ER for seizures. Patient admits to drinking alcohol today and states he did not take his medications despite stating that he has both medicaid and disability to aid in getting his prescriptions at [facility]. Patient states he last had a seizure while in ER room stating "I woke up and had peed on myself again" and states that he had been witnessed to have had 3 additional seizures today. Patient has no complaints of pain currently however reports that he has been having nosebleed and bleeding from his gums over the last few weeks. He denies blood in urine or stool. He denies aggravating or alleviating factors. Despite smelling of alcohol and mild slurring of speech, patient is able to answer questions appropriately and mentating appropriately.   Past Medical History  Diagnosis Date  . Seizure   . ETOHism     Past Surgical History  Procedure Date  . Cerebral aneurysm repair     At [facility]    History reviewed. No pertinent family history.  History  Substance Use Topics  . Smoking status: Current Everyday Smoker  . Smokeless tobacco: Not on file  . Alcohol Use: Yes     drin,ks everyday- beer- no liquor      Review of Systems  All other systems reviewed and are negative.    Allergies  Review of patient's allergies indicates no known allergies.  Home Medications   Current Outpatient Rx  Name Route Sig Dispense Refill  . CARBAMAZEPINE ER 200 MG PO  TB12 Oral Take 400 mg by mouth 3 (three) times daily.     Marland Kitchen LOPRESSOR PO Oral Take 1 tablet by mouth daily.      BP 122/69  Pulse 83  Temp 98.9 F (37.2 C) (Oral)  Resp 16  SpO2 94%  Physical Exam  Nursing note and vitals reviewed. Constitutional: He is oriented to person, place, and time. He appears well-developed and well-nourished. No distress.       Poor hygiene and unkept appearing.   HENT:  Head: Normocephalic and atraumatic.  Eyes: Conjunctivae and EOM are normal. Pupils are equal, round, and reactive to light.  Neck: Normal range of motion. Neck supple.  Cardiovascular: Normal rate, regular rhythm, normal heart sounds and intact distal pulses.  Exam reveals no gallop and no friction rub.   No murmur heard. Pulmonary/Chest: Effort normal and breath sounds normal. No respiratory distress. He has no wheezes. He has no rales. He exhibits no tenderness.  Abdominal: Soft. Bowel sounds are normal. He exhibits no distension and no mass. There is no tenderness. There is no rebound and no guarding.  Musculoskeletal: Normal range of motion.  Neurological: He is alert and oriented to person, place, and time.  Skin: Skin is warm and dry. No rash noted. He is not diaphoretic. No erythema.  Psychiatric: He has a normal mood and affect.    ED Course  Procedures (including critical care time)  IV banana bag and fluids.   Patient eating  and drinking in room without difficulty.   PO tegratol ordered.   Labs Reviewed  GLUCOSE, CAPILLARY - Abnormal; Notable for the following:    Glucose-Capillary 101 (*)     All other components within normal limits  ETHANOL - Abnormal; Notable for the following:    Alcohol, Ethyl (B) 326 (*)     All other components within normal limits  URINALYSIS, ROUTINE W REFLEX MICROSCOPIC - Abnormal; Notable for the following:    Color, Urine AMBER (*)  BIOCHEMICALS MAY BE AFFECTED BY COLOR   APPearance CLOUDY (*)     Bilirubin Urine SMALL (*)      Urobilinogen, UA >8.0 (*)     All other components within normal limits  CARBAMAZEPINE LEVEL, TOTAL - Abnormal; Notable for the following:    Carbamazepine Lvl <0.5 (*)     All other components within normal limits  CBC - Abnormal; Notable for the following:    RBC 2.75 (*)     Hemoglobin 10.5 (*)     HCT 30.4 (*)     MCV 110.5 (*)     MCH 38.2 (*)     Platelets 28 (*)     All other components within normal limits  BASIC METABOLIC PANEL - Abnormal; Notable for the following:    Calcium 8.1 (*)     All other components within normal limits  HEPATIC FUNCTION PANEL - Abnormal; Notable for the following:    Albumin 3.1 (*)     AST 139 (*)     ALT 55 (*)     All other components within normal limits  PROTIME-INR - Abnormal; Notable for the following:    Prothrombin Time 16.9 (*)     All other components within normal limits  APTT - Abnormal; Notable for the following:    aPTT 38 (*)     All other components within normal limits  URINE RAPID DRUG SCREEN (HOSP PERFORMED)   No results found.   1. Seizure disorder   2. Alcohol abuse   3. Thrombocytopenia     See Dr Cathlean Sauer note  MDM  Given patient's history of chronic alcohol abuse, we do not feel hospitalization to give platelets would be the best treatment plan. I spoke at length with patient about the need for alcohol cessation, strict seizure medication regimen, and following up closely with primary care and hematology. Patient voices his understanding and is agreeable plan.        [place], [place] 10/19/11 [phone]

## 2011-10-20 NOTE — ED Provider Notes (Signed)
Medical screening examination/treatment/procedure(s) were performed by non-physician practitioner and as supervising physician I was immediately available for consultation/collaboration.  Maki Hege, MD 10/20/11 0018 

## 2011-10-23 ENCOUNTER — Emergency Department (HOSPITAL_COMMUNITY)
Admission: EM | Admit: 2011-10-23 | Discharge: 2011-10-24 | Disposition: A | Discharge status: Home / Self Care | Payer: Medicaid Other | Attending: Emergency Medicine | Admitting: Emergency Medicine

## 2011-10-23 DIAGNOSIS — Z9119 Patient's noncompliance with other medical treatment and regimen: Secondary | ICD-10-CM | POA: Insufficient documentation

## 2011-10-23 DIAGNOSIS — D696 Thrombocytopenia, unspecified: Secondary | ICD-10-CM | POA: Insufficient documentation

## 2011-10-23 DIAGNOSIS — R569 Unspecified convulsions: Secondary | ICD-10-CM | POA: Insufficient documentation

## 2011-10-23 DIAGNOSIS — F1010 Alcohol abuse, uncomplicated: Secondary | ICD-10-CM | POA: Insufficient documentation

## 2011-10-23 DIAGNOSIS — F10929 Alcohol use, unspecified with intoxication, unspecified: Secondary | ICD-10-CM

## 2011-10-23 DIAGNOSIS — F17200 Nicotine dependence, unspecified, uncomplicated: Secondary | ICD-10-CM | POA: Insufficient documentation

## 2011-10-23 DIAGNOSIS — Z91199 Patient's noncompliance with other medical treatment and regimen due to unspecified reason: Secondary | ICD-10-CM | POA: Insufficient documentation

## 2011-10-23 DIAGNOSIS — Z9114 Patient's other noncompliance with medication regimen: Secondary | ICD-10-CM

## 2011-10-23 NOTE — ED Notes (Signed)
Bed:WHALA<BR> Expected date:<BR> Expected time:<BR> Means of arrival:<BR> Comments:<BR> Medic 48, 50 M, Fall, ? Seizure, +ETOH

## 2011-10-23 NOTE — ED Notes (Signed)
Pt unzipped pants and voided on self in the bed. He was assisted to the bathroom by NT to change into dry clothes/ blue paper scrubs. He is speaking loudly and being belligerent to ED staff while they are attempting to assist him.

## 2011-10-23 NOTE — ED Notes (Signed)
Per EMS, pt states he had a seizure. He arrives via EMS reluctant to get on a stretcher. He is a poor historian. Prior records indicated on 10/18/11 for ETOH.  VS: BP: 120/76  HR:70    RR:16      CBG:131  Abrasion noted on lt . Knee by EMS.

## 2011-10-24 LAB — CBC WITH DIFFERENTIAL/PLATELET
Basophils Relative: 0 % (ref 0–1)
Eosinophils Relative: 0 % (ref 0–5)
Hemoglobin: 11.8 g/dL — ABNORMAL LOW (ref 13.0–17.0)
Lymphocytes Relative: 68 % — ABNORMAL HIGH (ref 12–46)
MCH: 38.9 pg — ABNORMAL HIGH (ref 26.0–34.0)
Monocytes Relative: 7 % (ref 3–12)
Neutrophils Relative %: 25 % — ABNORMAL LOW (ref 43–77)
RBC: 3.03 MIL/uL — ABNORMAL LOW (ref 4.22–5.81)
WBC: 5.4 10*3/uL (ref 4.0–10.5)

## 2011-10-24 LAB — COMPREHENSIVE METABOLIC PANEL
ALT: 46 U/L (ref 0–53)
AST: 91 U/L — ABNORMAL HIGH (ref 0–37)
Alkaline Phosphatase: 91 U/L (ref 39–117)
CO2: 25 mEq/L (ref 19–32)
Chloride: 99 mEq/L (ref 96–112)
GFR calc Af Amer: >90 mL/min (ref >90)
GFR calc non Af Amer: >90 mL/min (ref >90)
Glucose, Bld: 101 mg/dL — ABNORMAL HIGH (ref 70–99)
Potassium: 3.7 mEq/L (ref 3.5–5.1)
Sodium: 135 mEq/L (ref 135–145)

## 2011-10-24 LAB — ETHANOL: Alcohol, Ethyl (B): 322 mg/dL — ABNORMAL HIGH (ref 0–11)

## 2011-10-24 LAB — CARBAMAZEPINE LEVEL, TOTAL: Carbamazepine Lvl: 0.8 ug/mL — ABNORMAL LOW (ref 4.0–12.0)

## 2011-10-24 MED ORDER — IBUPROFEN 800 MG PO TABS
800.0000 mg | ORAL_TABLET | Freq: Once | ORAL | Status: AC
Start: 2011-10-24 — End: 2011-10-24
  Administered 2011-10-24: 800 mg via ORAL
  Filled 2011-10-24: qty 1

## 2011-10-24 NOTE — ED Provider Notes (Signed)
History     CSN: [id number]  Arrival date & time 10/23/11  2150   First MD Initiated Contact with Patient 10/23/11 2353      Chief Complaint  Patient presents with  . Seizures  . Alcohol Intoxication    (Consider location/radiation/quality/duration/timing/severity/associated sxs/prior treatment) HPI Comments: 51 year old male with a history of alcohol abuse, seizure disorder who takes carbamazepine. He states that he does not take it regularly because he states "I get arrested has a fine drunk every time I take my medicine". He also drinks heavily every day. He states have been having seizures for 2 days straight, he cannot tell me what the seizures locally, he states was but by telling animals incontinent. He has no evidence of tongue biting on exam. The symptoms are intermittent, no associated fevers chills nausea vomiting diarrhea abdominal pain chest pain shortness breath rashes or swelling.   On arrival the patient was at his pants and urinated on himself in the bed this was not during a seizure.  Patient is a 51 y.o. male presenting with seizures and intoxication. The history is provided by the patient and medical records.  Seizures   Alcohol Intoxication    Past Medical History  Diagnosis Date  . Seizure   . ETOHism     Past Surgical History  Procedure Date  . Cerebral aneurysm repair     At [facility]    No family history on file.  History  Substance Use Topics  . Smoking status: Current Everyday Smoker  . Smokeless tobacco: Not on file  . Alcohol Use: Yes     drin,ks everyday- beer- no liquor      Review of Systems  Neurological: Positive for seizures.  All other systems reviewed and are negative.    Allergies  Review of patient's allergies indicates no known allergies.  Home Medications   Current Outpatient Rx  Name Route Sig Dispense Refill  . CARBAMAZEPINE 200 MG PO TABS Oral Take 400 mg by mouth 3 (three) times daily.      BP 111/72   Pulse 71  Temp 97.9 F (36.6 C) (Oral)  Resp 18  SpO2 96%  Physical Exam  Nursing note and vitals reviewed. Constitutional: He appears well-developed and well-nourished. No distress.  HENT:  Head: Normocephalic and atraumatic.  Mouth/Throat: Oropharynx is clear and moist. No oropharyngeal exudate.  Eyes: Conjunctivae and EOM are normal. Pupils are equal, round, and reactive to light. Right eye exhibits no discharge. Left eye exhibits no discharge. No scleral icterus.  Neck: Normal range of motion. Neck supple. No JVD present. No thyromegaly present.  Cardiovascular: Normal rate, regular rhythm, normal heart sounds and intact distal pulses.  Exam reveals no gallop and no friction rub.   No murmur heard. Pulmonary/Chest: Effort normal and breath sounds normal. No respiratory distress. He has no wheezes. He has no rales.  Abdominal: Soft. Bowel sounds are normal. He exhibits no distension and no mass. There is no tenderness.  Musculoskeletal: Normal range of motion. He exhibits no edema and no tenderness.  Lymphadenopathy:    He has no cervical adenopathy.  Neurological: He is alert. Coordination normal.       Clear speech, moves all strumming is x4, follows commands without difficulty  Skin: Skin is warm and dry. No rash noted. No erythema.  Psychiatric: He has a normal mood and affect. His behavior is normal.    ED Course  Procedures (including critical care time)  Labs Reviewed  ETHANOL - Abnormal;  Notable for the following:    Alcohol, Ethyl (B) 322 (*)     All other components within normal limits  CARBAMAZEPINE LEVEL, TOTAL - Abnormal; Notable for the following:    Carbamazepine Lvl 0.8 (*)     All other components within normal limits  CBC WITH DIFFERENTIAL - Abnormal; Notable for the following:    RBC 3.03 (*)     Hemoglobin 11.8 (*)     HCT 33.6 (*)     MCV 110.9 (*)     MCH 38.9 (*)     Platelets 46 (*)     Neutrophils Relative 25 (*)     Lymphocytes Relative 68  (*)     Neutro Abs 1.4 (*)     All other components within normal limits  COMPREHENSIVE METABOLIC PANEL - Abnormal; Notable for the following:    Glucose, Bld 101 (*)     Albumin 3.4 (*)     AST 91 (*)     All other components within normal limits   No results found.   1. Alcohol intoxication   2. Thrombocytopenia   3. Noncompliance with medications       MDM  The patient smells of alcohol, has normal vital signs, no signs of seizure activity it is not appear to be postictal. Check levels, alcohol, observation.   Alcohol level significantly elevated, patient has his Tegretol, he states that he doesn't like to take it however he does have it and agrees to take it. At this point the patient is clinically sober, I have observed him for 6 hours and he has had no seizures, normal mental status, tolerated oral fluids. His laboratory workup shows that he has a thrombocytopenia, this is chronic, compared with 6 days ago it is increased compared with 2 weeks ago it is the same. The patient is to follow up very closely with his family Dr., and I have given a list of resources phone numbers.   The patient has not had a seizure here, I question if he ever had a seizure before coming in. He is showing no signs of alcohol withdrawal.  Vida Roller, MD 10/24/11 [phone]

## 2011-10-24 NOTE — ED Notes (Signed)
Patient is resting comfortably. 

## 2011-11-04 ENCOUNTER — Emergency Department (HOSPITAL_COMMUNITY)
Admission: EM | Admit: 2011-11-04 | Discharge: 2011-11-04 | Disposition: A | Discharge status: Home / Self Care | Payer: Medicaid Other | Attending: Emergency Medicine | Admitting: Emergency Medicine

## 2011-11-04 DIAGNOSIS — R569 Unspecified convulsions: Secondary | ICD-10-CM | POA: Insufficient documentation

## 2011-11-04 DIAGNOSIS — F17200 Nicotine dependence, unspecified, uncomplicated: Secondary | ICD-10-CM | POA: Insufficient documentation

## 2011-11-04 DIAGNOSIS — D696 Thrombocytopenia, unspecified: Secondary | ICD-10-CM | POA: Insufficient documentation

## 2011-11-04 DIAGNOSIS — G40909 Epilepsy, unspecified, not intractable, without status epilepticus: Secondary | ICD-10-CM

## 2011-11-04 DIAGNOSIS — F10229 Alcohol dependence with intoxication, unspecified: Secondary | ICD-10-CM

## 2011-11-04 LAB — CBC WITH DIFFERENTIAL/PLATELET
Basophils Absolute: 0 10*3/uL (ref 0.0–0.1)
Eosinophils Relative: 0 % (ref 0–5)
HCT: 33.9 % — ABNORMAL LOW (ref 39.0–52.0)
Lymphs Abs: 2 10*3/uL (ref 0.7–4.0)
MCH: 38.9 pg — ABNORMAL HIGH (ref 26.0–34.0)
MCV: 111.9 fL — ABNORMAL HIGH (ref 78.0–100.0)
Monocytes Absolute: 0.3 10*3/uL (ref 0.1–1.0)
Neutro Abs: 2.3 10*3/uL (ref 1.7–7.7)
Platelets: 34 10*3/uL — ABNORMAL LOW (ref 150–400)
RDW: 13.1 % (ref 11.5–15.5)

## 2011-11-04 LAB — BASIC METABOLIC PANEL
BUN: 5 mg/dL — ABNORMAL LOW (ref 6–23)
CO2: 27 mEq/L (ref 19–32)
Calcium: 8.3 mg/dL — ABNORMAL LOW (ref 8.4–10.5)
Chloride: 102 mEq/L (ref 96–112)
Creatinine, Ser: 0.61 mg/dL (ref 0.50–1.35)

## 2011-11-04 LAB — ETHANOL: Alcohol, Ethyl (B): 313 mg/dL — ABNORMAL HIGH (ref 0–11)

## 2011-11-04 MED ORDER — CARBAMAZEPINE 200 MG PO TABS: 400.0000 mg | ORAL_TABLET | Freq: Three times a day (TID) | ORAL | Status: DC

## 2011-11-04 MED ORDER — CARBAMAZEPINE 200 MG PO TABS
400.0000 mg | ORAL_TABLET | Freq: Once | ORAL | Status: AC
  Administered 2011-11-04: 400 mg via ORAL
  Filled 2011-11-04: qty 2

## 2011-11-04 NOTE — Progress Notes (Signed)
[facility] ED CM consulted by ED PA for medication assistance for pt.  This is a medicaid patient who is not a candidate for [facility] indigent medication assistance This was explained to the pt.  While attempting to explain this to the pt, he began to speak loudly to Cm. When Cm asked if he could speak lower he stated he was hard of hearing and apologized for speaking loud.  Pt then began to ventilate his feelings about ED staff not understanding he talked loud because he is hard of hearing, he feels he is "dying" because he had "four seizures today", "I'm sick" "know I had a seizure here" "I take tegretol 1200 mg" "They put the iv in my arm but did not give me the bag" CM allowed pt to ventilate his feelings.  CM discussed triage process for care, nurses have to await  For "bag" of medicine from pharmacy if not standard iv fluids, and encouraged him to discuss his hard of hearing with each staff member if they voiced concern about him speaking loudly.  Pt speaking lower after CM reviewed these areas with him.  Cm then able to review with pt that he is not a candidate for [facility] indigent medication assistance but [facility] (delivers) is the pharmacy [facility] is redirecting patients to. Provided pt with written information on [facility], [email or web address], [facility] outpatient pharmacies and financial resources. Pt inquired about the cost of medicaid medications CM reviewed general cost for most medicaid medications.  Pt voiced understanding and appreciation for resource Cm placed this written information inside of pt belonging bag.

## 2011-11-04 NOTE — ED Provider Notes (Signed)
History     CSN: [id number]  Arrival date & time 11/04/11  1449   First MD Initiated Contact with Patient 11/04/11 1607      Chief Complaint  Patient presents with  . Seizures    (Consider location/radiation/quality/duration/timing/severity/associated sxs/prior treatment) HPI Pt is a 51 yo male who presents to the ED after being found by a bystander lying on the side of the road with his legs twitching. Per EMS report, pt was awake and alert on their arrival with no current seizure activity. He did lose continence of bladder. Pt smelled strongly of alcohol.  Pt is agitated and reports that he had 4 seizures today. He acknowledges noncompliance with Tegretol and says last dose was 2 days ago. Admitted to drinking 24oz beer prior to ED arrival. Says he no longer can get meds for free at [facility] since becoming medicaid eligible.   Past Medical History  Diagnosis Date  . Seizure   . ETOHism     Past Surgical History  Procedure Date  . Cerebral aneurysm repair     At [facility]    No family history on file.  History  Substance Use Topics  . Smoking status: Current Everyday Smoker  . Smokeless tobacco: Not on file  . Alcohol Use: Yes     drin,ks everyday- beer- no liquor      Review of Systems  Respiratory: Negative for shortness of breath.   Cardiovascular: Negative for chest pain.  Gastrointestinal: Positive for diarrhea. Negative for nausea, vomiting and abdominal pain.  Genitourinary: Negative for dysuria.  Musculoskeletal: Negative for arthralgias.  Neurological: Positive for headaches.    Allergies  Review of patient's allergies indicates no known allergies.  Home Medications   Current Outpatient Rx  Name Route Sig Dispense Refill  . CARBAMAZEPINE 200 MG PO TABS Oral Take 400 mg by mouth 3 (three) times daily.      BP 131/70  Pulse 73  Temp 98 F (36.7 C) (Oral)  Resp 18  SpO2 98%  Physical Exam  Constitutional: He is oriented to person,  place, and time.  HENT:  Head: Normocephalic and atraumatic.  Mouth/Throat: Oropharynx is clear and moist.       No bite lesion on tongue.   Eyes: Pupils are equal, round, and reactive to light. Scleral icterus is present.  Neck: Normal range of motion. Neck supple.  Cardiovascular: Normal rate, regular rhythm and normal heart sounds.   Pulmonary/Chest: Effort normal and breath sounds normal. No respiratory distress.  Abdominal: Soft. Bowel sounds are normal. He exhibits no distension. There is no tenderness.  Neurological: He is alert and oriented to person, place, and time. No cranial nerve deficit.  Psychiatric:       Agitated, angry towards staff, demanding food. Accusing staff of being "liars"    ED Course  Procedures (including critical care time)  Labs Reviewed  CBC WITH DIFFERENTIAL - Abnormal; Notable for the following:    RBC 3.03 (*)     Hemoglobin 11.8 (*)     HCT 33.9 (*)     MCV 111.9 (*)     MCH 38.9 (*)     All other components within normal limits  BASIC METABOLIC PANEL - Abnormal; Notable for the following:    BUN 5 (*)     Calcium 8.3 (*)     All other components within normal limits  ETHANOL - Abnormal; Notable for the following:    Alcohol, Ethyl (B) 313 (*)  All other components within normal limits  GLUCOSE, CAPILLARY  CARBAMAZEPINE LEVEL, TOTAL   No results found.   No diagnosis found.    MDM  1. Seizure Pt is EtOH abuser w seizure d/o 2/2 remote cerebral aneurysm bleed. Noncompliant w tegretol 400mg  TID. No recurrent sz during admission, pt intoxicated (EtOH level 313) w no s/s of withdrawal. Tegretol level undetectable. Gave tegretol 400mg . Pt was A+Ox3 and ambulating without difficulty at time of discharge.  - CSW saw to discuss possibilities for obtaining medications. Pt agreed to fill and take medications. Script for 1 mo worth of Tegretol 400mg  TID was given.   2. Thrombocytopenia Chronic, stable today. No signs of active bleeding. No  transfusions given.       Bronson Curb, MD 11/04/11 2153

## 2011-11-04 NOTE — ED Provider Notes (Signed)
Reports had 4 seizures today. Noncompliant with Tegretol. Admits to drinking alcohol today. Patient alert Glasgow Coma Score 15 moves all extremities cranial nerves II through XII grossly intact  Doug Sou, MD 11/04/11 1717

## 2011-11-04 NOTE — ED Provider Notes (Signed)
I have personally seen and examined the patient.  I have discussed the plan of care with the resident.  I have reviewed the documentation on PMH/FH/Soc. History.  I have reviewed the documentation of the resident and agree.  Doug Sou, MD 11/04/11 [phone]

## 2011-11-04 NOTE — ED Notes (Addendum)
Pt found by bystander on side of the road w/legs twitching.  Upon arrival by EMS, pt was awake and alert.  He had been incontinent of urine and reported hx of seizures.  CBG 150.  Pt is homeless and smells or urine, stool and ETOH.  Pt denies ETOH abuse.  EMS vitals 108/66, 74, 98%RA.

## 2011-11-04 NOTE — ED Notes (Signed)
RN to obtain labs with start of IV 

## 2011-11-14 ENCOUNTER — Emergency Department (HOSPITAL_COMMUNITY)
Admission: EM | Admit: 2011-11-14 | Discharge: 2011-11-14 | Disposition: A | Discharge status: Home / Self Care | Payer: Medicaid Other | Attending: Emergency Medicine | Admitting: Emergency Medicine

## 2011-11-14 ENCOUNTER — Encounter (HOSPITAL_COMMUNITY): Payer: Self-pay | Admitting: Emergency Medicine

## 2011-11-14 DIAGNOSIS — Z765 Malingerer [conscious simulation]: Secondary | ICD-10-CM | POA: Insufficient documentation

## 2011-11-14 DIAGNOSIS — F445 Conversion disorder with seizures or convulsions: Secondary | ICD-10-CM

## 2011-11-14 DIAGNOSIS — F1020 Alcohol dependence, uncomplicated: Secondary | ICD-10-CM

## 2011-11-14 DIAGNOSIS — F17200 Nicotine dependence, unspecified, uncomplicated: Secondary | ICD-10-CM | POA: Insufficient documentation

## 2011-11-14 DIAGNOSIS — R569 Unspecified convulsions: Secondary | ICD-10-CM

## 2011-11-14 LAB — POCT I-STAT, CHEM 8
BUN: 4 mg/dL — ABNORMAL LOW (ref 6–23)
Calcium, Ion: 1.12 mmol/L (ref 1.12–1.23)
Chloride: 103 mEq/L (ref 96–112)
Glucose, Bld: 119 mg/dL — ABNORMAL HIGH (ref 70–99)
TCO2: 23 mmol/L (ref 0–100)

## 2011-11-14 MED ORDER — LORAZEPAM 1 MG PO TABS: 1.0000 mg | ORAL_TABLET | Freq: Three times a day (TID) | ORAL | Status: AC | PRN

## 2011-11-14 NOTE — ED Notes (Signed)
Pt very uncooperative and verbally abusing to the nursing and medical staff.Security called.Pt refusing to sign discharge and wanting to discharge himself.

## 2011-11-14 NOTE — ED Notes (Signed)
Pt refusing to sign disharge instruction.Verbaly abusive towards medical and nursing staff.

## 2011-11-14 NOTE — ED Notes (Signed)
Pt  Brought to ED by EMS with the complaint of seizures.Pt says that he has missed his seizure medication.

## 2011-11-14 NOTE — ED Provider Notes (Signed)
History     CSN: [id number]  Arrival date & time 11/14/11  1478   First MD Initiated Contact with Patient 11/14/11 1949      Chief Complaint  Patient presents with  . Seizures    (Consider location/radiation/quality/duration/timing/severity/associated sxs/prior treatment) HPI Comments: [name] [name] 51 y.o. male   The chief complaint is: Patient presents with:   Seizures   The patient has medical history significant for:   Past Medical History:   Seizure                                                      ETOHism                                                     Patient with history of alcoholism, thrombocytopenia, and seizures presents stating that he has had 4 seizures today and has not been compliant to his tegretol because he now qualifies for medicaid and can no longer get free medications. Patient stated that he had a seizure outside and a "lady" called EMS. Patient has presented to ED multiple times with the same complaint. Specifically 10/07/11, 10/18/11, 10/24/11, 11/04/11,smelling of ETOH, and demanding food. During these prior visits his ETOH has consistently been in the 300's. Denies fever or chills. Denies NVD or abdominal pain. Denies head trauma or injury.      The history is provided by the patient.    Past Medical History  Diagnosis Date  . Seizure   . ETOHism     Past Surgical History  Procedure Date  . Cerebral aneurysm repair     At [facility]    No family history on file.  History  Substance Use Topics  . Smoking status: Current Everyday Smoker  . Smokeless tobacco: Not on file  . Alcohol Use: Yes     drin,ks everyday- beer- no liquor      Review of Systems  Constitutional: Negative for fever, chills and diaphoresis.  Gastrointestinal: Negative for nausea, vomiting and diarrhea.  Neurological: Positive for seizures.    Allergies  Review of patient's allergies indicates no known allergies.  Home Medications   Current  Outpatient Rx  Name Route Sig Dispense Refill  . CARBAMAZEPINE 200 MG PO TABS Oral Take 2 tablets (400 mg total) by mouth 3 (three) times daily. 180 tablet 0  . LOPRESSOR PO Oral Take 1 tablet by mouth 3 (three) times daily.    Marland Kitchen PRESCRIPTION MEDICATION Oral Take 1 capsule by mouth 3 (three) times daily. Additional seizure medication.      BP 109/69  Pulse 76  Temp 98.1 F (36.7 C) (Oral)  Resp 20  SpO2 95%  Physical Exam  ED Course  Procedures (including critical care time)  Labs Reviewed - No data to display No results found.   1. Pseudoseizures   2. Alcoholism   3. Malingering       MDM  Patient has presented 4-5 times to the ED with complaint of multiple seizures. EMS arrives and states that there is not seizure activity or post-ictal state. On these visits patient is found to be intoxicated, seizure medication nontherapetic, and requesting food. I stat  done and patient labs stable. Patient given [place] sandwich and discharged with resource list to establish primary care. Patient became agitated and argumentative stating he had a seizure and we did not give him adequate care. I believe this behavior to be malingering, and that he fakes seizures for admission, shelter, and free food.Return precautions given.        Pixie Casino, PA-C 11/14/11 2256

## 2011-11-16 ENCOUNTER — Emergency Department (HOSPITAL_COMMUNITY): Payer: Medicaid Other

## 2011-11-16 ENCOUNTER — Encounter (HOSPITAL_COMMUNITY): Payer: Self-pay | Admitting: Emergency Medicine

## 2011-11-16 ENCOUNTER — Emergency Department (HOSPITAL_COMMUNITY)
Admission: EM | Admit: 2011-11-16 | Discharge: 2011-11-17 | Disposition: A | Discharge status: Home / Self Care | Payer: Medicaid Other | Attending: Emergency Medicine | Admitting: Emergency Medicine

## 2011-11-16 DIAGNOSIS — F445 Conversion disorder with seizures or convulsions: Secondary | ICD-10-CM

## 2011-11-16 DIAGNOSIS — Z79899 Other long term (current) drug therapy: Secondary | ICD-10-CM | POA: Insufficient documentation

## 2011-11-16 DIAGNOSIS — F1020 Alcohol dependence, uncomplicated: Secondary | ICD-10-CM | POA: Insufficient documentation

## 2011-11-16 DIAGNOSIS — Z765 Malingerer [conscious simulation]: Secondary | ICD-10-CM | POA: Insufficient documentation

## 2011-11-16 DIAGNOSIS — F17200 Nicotine dependence, unspecified, uncomplicated: Secondary | ICD-10-CM | POA: Insufficient documentation

## 2011-11-16 DIAGNOSIS — F1410 Cocaine abuse, uncomplicated: Secondary | ICD-10-CM

## 2011-11-16 DIAGNOSIS — Z91199 Patient's noncompliance with other medical treatment and regimen due to unspecified reason: Secondary | ICD-10-CM | POA: Insufficient documentation

## 2011-11-16 DIAGNOSIS — Z9119 Patient's noncompliance with other medical treatment and regimen: Secondary | ICD-10-CM | POA: Insufficient documentation

## 2011-11-16 DIAGNOSIS — R569 Unspecified convulsions: Secondary | ICD-10-CM | POA: Insufficient documentation

## 2011-11-16 LAB — CARBAMAZEPINE LEVEL, TOTAL: Carbamazepine Lvl: <0.5 ug/mL — ABNORMAL LOW (ref 4.0–12.0)

## 2011-11-16 LAB — BASIC METABOLIC PANEL
Calcium: 8.5 mg/dL (ref 8.4–10.5)
Creatinine, Ser: 0.6 mg/dL (ref 0.50–1.35)
GFR calc Af Amer: >90 mL/min (ref >90)
GFR calc non Af Amer: >90 mL/min (ref >90)

## 2011-11-16 LAB — RAPID URINE DRUG SCREEN, HOSP PERFORMED: Opiates: NOT DETECTED

## 2011-11-16 LAB — ETHANOL: Alcohol, Ethyl (B): 221 mg/dL — ABNORMAL HIGH (ref 0–11)

## 2011-11-16 MED ORDER — LORAZEPAM 2 MG/ML IJ SOLN
2.0000 mg | Freq: Once | INTRAMUSCULAR | Status: AC
  Administered 2011-11-16: 2 mg via INTRAVENOUS
  Filled 2011-11-16: qty 1

## 2011-11-16 NOTE — ED Notes (Signed)
Patient transported to X-ray 

## 2011-11-16 NOTE — ED Notes (Signed)
Pt BIB EMS. Pt had witnessed sz lying on sidewalk. Pt has hx of seizures. Pt was picked up on Monday for same by EMS. Pt arrives a/o x 3 asking for something to eat. Pt was incontinent of urine at scene.

## 2011-11-16 NOTE — ED Notes (Signed)
Pt found to have incontinent of bowel and bladder in bed, pt states he tried to call. Pt states he is usually continent, only incontinent when he has seizure. Pt A & O. Seizure pads in place. Pt stood at sink and bathed himself with staff at bedside while linens changed.

## 2011-11-16 NOTE — ED Notes (Signed)
Pt. Has seizure pad on bed refuse to get in is sitting in a chair eating

## 2011-11-17 MED ORDER — CARBAMAZEPINE 200 MG PO TABS: 400.0000 mg | ORAL_TABLET | Freq: Three times a day (TID) | ORAL | Status: DC

## 2011-11-17 NOTE — ED Provider Notes (Signed)
History     CSN: [id number]  Arrival date & time 11/16/11  1827   First MD Initiated Contact with Patient 11/16/11 1907      Chief Complaint  Patient presents with  . Seizures    (Consider location/radiation/quality/duration/timing/severity/associated sxs/prior treatment) HPI [name] [name] is a 51 y.o. male who presents with complaint of "seizure". Patient says a bystander witnessed him having a seizure on the sidewalk and when he came out of the seizure he didn't want to person to call 911 but they did. Patient does not seem postictal at this time. He is currently alert and oriented. He is complaining about pain would like some pain medicine and would like something to eat. There is report of him being incontinent of urine at the scene. Patient appears mildly intoxicated, slurring of speech but is otherwise alert and oriented. He denies any other drug use. He denies taking his carbamazepine because he says he cannot afford it.  Past Medical History  Diagnosis Date  . Seizure   . ETOHism     Past Surgical History  Procedure Date  . Cerebral aneurysm repair     At [facility]    No family history on file.  History  Substance Use Topics  . Smoking status: Current Everyday Smoker  . Smokeless tobacco: Not on file  . Alcohol Use: Yes     drin,ks everyday- beer- no liquor      Review of Systems positive for reports of seizure, Patient denies any fevers or chills, changes in vision, earache, sore throat, neck pain or stiffness, chest pain or pressure, palpitations, syncope, dyspnea, cough, wheezing,  abdominal pain, nausea, vomiting, diarrhea, melena, red bloody stools, frequency, dysuria, myalgias, arthralgias, back pain, recent trauma, rash, itching, skin lesions, easy bruising or bleeding, headache, numbness, tingling or weakness and denies depression, and anxiety.    Allergies  Review of patient's allergies indicates no known allergies.  Home Medications    Current Outpatient Rx  Name Route Sig Dispense Refill  . CARBAMAZEPINE 200 MG PO TABS Oral Take 2 tablets (400 mg total) by mouth 3 (three) times daily. 180 tablet 0  . LORAZEPAM 1 MG PO TABS Oral Take 1 tablet (1 mg total) by mouth 3 (three) times daily as needed for anxiety. 15 tablet 0  . LOPRESSOR PO Oral Take 1 tablet by mouth 3 (three) times daily.    Marland Kitchen PRESCRIPTION MEDICATION Oral Take 1 capsule by mouth 3 (three) times daily. Additional seizure medication.    Marland Kitchen CARBAMAZEPINE 200 MG PO TABS Oral Take 2 tablets (400 mg total) by mouth 3 (three) times daily. 84 tablet 0    BP 141/84  Pulse 91  Temp 98 F (36.7 C) (Oral)  Resp 17  SpO2 95%  Physical Exam PHYSICAL EXAM: VITAL SIGNS:   Filed Vitals:   11/16/11 2228  BP: 141/84  Pulse: 91  Temp:   Resp: 17   CONSTITUTIONAL: Awake, oriented, appears non-toxic HENT: Atraumatic, normocephalic, oral mucosa pink and moist, airway patent. Poor dentition Nares patent without drainage. External ears normal. EYES: Conjunctiva clear, EOMI, PERRLA NECK: Trachea midline, non-tender, supple CARDIOVASCULAR: Normal heart rate, Normal rhythm, No murmurs, rubs, gallops PULMONARY/CHEST: Clear to auscultation, no rhonchi, wheezes. Symmetrical breath sounds. CHEST WALL: No lesions. Non-tender. ABDOMINAL: Non-distended, soft, non-tender - no rebound or guarding.  BS normal. NEUROLOGIC: MV:HQIONG fields intact. PERRLA, EOMI.  Facial sensation equal to light touch bilaterally.  Good muscle bulk in the masseter muscle and good lateral movement of  the jaw.  Facial expressions equal and good strength with smile/frown and puffed cheeks.  Hearing grossly intact to finger rub test.  Uvula, tongue are midline with no deviation. Symmetrical palate elevation.  Trapezius and SCM muscles are 5/5 strength bilaterally.   DTR: Brachioradialis, biceps, patellar, Achilles tendon reflexes 2+ bilaterally.  No clonus. Strength: 5/5 strength flexors and extensors in  the upper and lower extremities.  Grip strength, finger adduction/abduction 5/5. Sensation: Sensation intact distally to light touch Cerebellar: No ataxia with walking or dysmetria with finger to nose, rapid alternating hand movements and heels to shin testing. EXTREMITIES: No clubbing, cyanosis, or edema SKIN: Warm, Dry, No erythema, No rash   ED Course  Procedures (including critical care time)  Labs Reviewed  CARBAMAZEPINE LEVEL, TOTAL - Abnormal; Notable for the following:    Carbamazepine Lvl <0.5 (*)     All other components within normal limits  ETHANOL - Abnormal; Notable for the following:    Alcohol, Ethyl (B) 221 (*)     All other components within normal limits  BASIC METABOLIC PANEL - Abnormal; Notable for the following:    Glucose, Bld 102 (*)     BUN 5 (*)     All other components within normal limits  URINE RAPID DRUG SCREEN (HOSP PERFORMED) - Abnormal; Notable for the following:    Cocaine POSITIVE (*)     All other components within normal limits   Dg Chest 2 View  11/16/2011  *RADIOLOGY REPORT*  Clinical Data: Seizures.  Wheezing.  CHEST - 2 VIEW  Comparison: None.  Findings: Lungs are clear.  Heart size is upper normal.  No pneumothorax or pleural fluid.  Degenerative change is present about the shoulders.  Synostosis between the right six and seventh ribs is incidentally noted.  IMPRESSION: No acute finding.   Original Report Authenticated By: Bernadene Bell. D'ALESSIO, M.D.      1. Malingering   2. Pseudoseizure   3. Alcoholism   4. Cocaine abuse       MDM  [name] [name] is a 51 y.o. male with a past medical history of seizures, reporting 4 seizures today and noncompliance of medication presents again to the emergency department as he has 4 times in the last month.  This is a similar story to that when he gave last time we smells of alcohol and says that he had 4 seizures during the day, had a bystander called EMS he did not even want to, he arrives  demanding food and pain medicine.   was not postictal and immediately started eating some take out food he had in the bag when he got to the ER. Again I think the patient had a seizure today. I do not think a semi-normal neurologic exam that the patient warrants any neuroimaging today.  Ethanol was positive at 221 his carbamazepine is undetectable and his urine drug screen is positive for cocaine. I discussed with the patient that this emergency department can not help him if he continues to abuse alcohol take illicit drugs and not take his seizure medicine. Furthermore noted he's taking his seizure medicine because he is choosing to spend his money on alcohol, tobacco and cocaine. Patient says he used to get his drugs free at the free clinic closed. I've written him a prescription for 2 weeks supply of carbamazepine to use the resources he has been provided to obtain his prescriptions.  I explained the diagnosis in detail and have given standard ER return precautions. The patient  understands and accepts the medical plan as it's been dictated and I have answered all questions. Discharge instructions concerning home care and prescriptions have been given.  The patient is STABLE and is discharged to home in good condition.     Jones Skene, MD 11/17/11 1610

## 2011-11-17 NOTE — ED Provider Notes (Signed)
Medical screening examination/treatment/procedure(s) were conducted as a shared visit with non-physician practitioner(s) and myself.  I personally evaluated the patient during the encounter.  No seizure activity emergency department. Neuro exam normal  Donnetta Hutching, MD 11/17/11 2119

## 2011-12-17 ENCOUNTER — Encounter (HOSPITAL_COMMUNITY): Payer: Self-pay | Admitting: Emergency Medicine

## 2011-12-17 ENCOUNTER — Emergency Department (HOSPITAL_COMMUNITY)
Admission: EM | Admit: 2011-12-17 | Discharge: 2011-12-18 | Disposition: A | Discharge status: Home / Self Care | Payer: Medicaid Other | Attending: Emergency Medicine | Admitting: Emergency Medicine

## 2011-12-17 DIAGNOSIS — G319 Degenerative disease of nervous system, unspecified: Secondary | ICD-10-CM | POA: Insufficient documentation

## 2011-12-17 DIAGNOSIS — M542 Cervicalgia: Secondary | ICD-10-CM | POA: Insufficient documentation

## 2011-12-17 DIAGNOSIS — F10929 Alcohol use, unspecified with intoxication, unspecified: Secondary | ICD-10-CM

## 2011-12-17 DIAGNOSIS — F1010 Alcohol abuse, uncomplicated: Secondary | ICD-10-CM | POA: Insufficient documentation

## 2011-12-17 DIAGNOSIS — R569 Unspecified convulsions: Secondary | ICD-10-CM | POA: Insufficient documentation

## 2011-12-17 DIAGNOSIS — R51 Headache: Secondary | ICD-10-CM | POA: Insufficient documentation

## 2011-12-17 NOTE — ED Notes (Signed)
WUJ:[id number]<JY> Expected date:12/17/11<BR> Expected time:10:34 PM<BR> Means of arrival:Ambulance<BR> Comments:<BR> Res B: ETOH

## 2011-12-17 NOTE — ED Notes (Signed)
Per EMS, pt found lying on the ground in a parking lot. Smells of ETOH. Pt is combative and c/o "left ear leaking" and that he has had three seizures today. Takes Tegretol for seizures but has not had any in 3 days.

## 2011-12-18 ENCOUNTER — Emergency Department (HOSPITAL_COMMUNITY): Payer: Medicaid Other

## 2011-12-18 LAB — CBC
HCT: 38.9 % — ABNORMAL LOW (ref 39.0–52.0)
Hemoglobin: 13.9 g/dL (ref 13.0–17.0)
MCV: 105.7 fL — ABNORMAL HIGH (ref 78.0–100.0)
Platelets: 79 10*3/uL — ABNORMAL LOW (ref 150–400)
RBC: 3.68 MIL/uL — ABNORMAL LOW (ref 4.22–5.81)
WBC: 4.2 10*3/uL (ref 4.0–10.5)

## 2011-12-18 LAB — COMPREHENSIVE METABOLIC PANEL
AST: 123 U/L — ABNORMAL HIGH (ref 0–37)
BUN: 4 mg/dL — ABNORMAL LOW (ref 6–23)
CO2: 30 mEq/L (ref 19–32)
Chloride: 97 mEq/L (ref 96–112)
Creatinine, Ser: 0.52 mg/dL (ref 0.50–1.35)
GFR calc non Af Amer: >90 mL/min (ref >90)
Total Bilirubin: 0.3 mg/dL (ref 0.3–1.2)

## 2011-12-18 LAB — RAPID URINE DRUG SCREEN, HOSP PERFORMED
Amphetamines: NOT DETECTED
Tetrahydrocannabinol: NOT DETECTED

## 2011-12-18 LAB — PROTIME-INR
INR: 1.4 (ref 0.00–1.49)
Prothrombin Time: 16.8 seconds — ABNORMAL HIGH (ref 11.6–15.2)

## 2011-12-18 LAB — SALICYLATE LEVEL: Salicylate Lvl: <2 mg/dL — ABNORMAL LOW (ref 2.8–20.0)

## 2011-12-18 MED ORDER — CARBAMAZEPINE 200 MG PO TABS: 400.0000 mg | ORAL_TABLET | Freq: Three times a day (TID) | ORAL | Status: DC

## 2011-12-18 NOTE — ED Provider Notes (Signed)
History     CSN: [id number]  Arrival date & time 12/17/11  2248   First MD Initiated Contact with Patient 12/17/11 2305      Chief Complaint  Patient presents with  . Medical Clearance    (Consider location/radiation/quality/duration/timing/severity/associated sxs/prior treatment) HPI Pt has been in the ED multiple times for similar complaints. Was found lying on ground by passerby and EMS was called. Pt combative and intoxicated. States he had 3 seizures today and has been out of his tegretol. Complains of head and neck pain without obvious or known injury. States his left ear feel "squishy" and thinks he is bleeding from it.  Past Medical History  Diagnosis Date  . Seizure   . ETOHism     Past Surgical History  Procedure Date  . Cerebral aneurysm repair     At [facility]    History reviewed. No pertinent family history.  History  Substance Use Topics  . Smoking status: Current Every Day Smoker  . Smokeless tobacco: Not on file  . Alcohol Use: Yes     drin,ks everyday- beer- no liquor      Review of Systems  Constitutional: Negative for fever and chills.  HENT: Positive for neck pain. Negative for neck stiffness.   Respiratory: Negative for shortness of breath.   Cardiovascular: Negative for chest pain.  Gastrointestinal: Negative for nausea, vomiting and abdominal pain.  Musculoskeletal: Negative for back pain.  Skin: Negative for rash and wound.  Neurological: Positive for seizures and headaches. Negative for dizziness, weakness and numbness.    Allergies  Review of patient's allergies indicates no known allergies.  Home Medications   Current Outpatient Rx  Name Route Sig Dispense Refill  . LOPRESSOR PO Oral Take 1 tablet by mouth 2 (two) times daily.     Marland Kitchen PRESCRIPTION MEDICATION Oral Take 1 capsule by mouth 3 (three) times daily. Additional seizure medication.    Marland Kitchen CARBAMAZEPINE 200 MG PO TABS Oral Take 2 tablets (400 mg total) by mouth 3 (three)  times daily. 84 tablet 0    BP 124/67  Pulse 74  Temp 98.3 F (36.8 C) (Oral)  Resp 18  SpO2 96%  Physical Exam  Nursing note and vitals reviewed. Constitutional: He is oriented to person, place, and time. He appears well-developed and well-nourished. No distress.       Unkempt   HENT:  Head: Normocephalic and atraumatic.  Right Ear: External ear normal.  Left Ear: External ear normal.  Mouth/Throat: Oropharynx is clear and moist.  Eyes: EOM are normal. Pupils are equal, round, and reactive to light.  Neck:       Pt placed in cervical collar by EMS  Cardiovascular: Normal rate and regular rhythm.   Pulmonary/Chest: Effort normal and breath sounds normal. No respiratory distress. He has no wheezes. He has no rales.  Abdominal: Soft. Bowel sounds are normal. There is no tenderness. There is no rebound and no guarding.  Musculoskeletal: Normal range of motion. He exhibits no edema and no tenderness.  Neurological: He is alert and oriented to person, place, and time.       Moves all ext without deficit  Skin: Skin is warm and dry. No rash noted. No erythema.    ED Course  Procedures (including critical care time)  Labs Reviewed  CBC - Abnormal; Notable for the following:    RBC 3.68 (*)     HCT 38.9 (*)     MCV 105.7 (*)  MCH 37.8 (*)     Platelets 79 (*)     All other components within normal limits  COMPREHENSIVE METABOLIC PANEL - Abnormal; Notable for the following:    Glucose, Bld 107 (*)     BUN 4 (*)     Albumin 3.3 (*)     AST 123 (*)     ALT 62 (*)     Alkaline Phosphatase 130 (*)     All other components within normal limits  ETHANOL - Abnormal; Notable for the following:    Alcohol, Ethyl (B) 209 (*)     All other components within normal limits  SALICYLATE LEVEL - Abnormal; Notable for the following:    Salicylate Lvl <2.0 (*)     All other components within normal limits  PROTIME-INR - Abnormal; Notable for the following:    Prothrombin Time 16.8  (*)     All other components within normal limits  ACETAMINOPHEN LEVEL  URINE RAPID DRUG SCREEN (HOSP PERFORMED)  APTT   Ct Head Wo Contrast  12/18/2011  *RADIOLOGY REPORT*  Clinical Data:  Seizure,  found on ground.  CT HEAD WITHOUT CONTRAST CT CERVICAL SPINE WITHOUT CONTRAST  Technique:  Multidetector CT imaging of the head and cervical spine was performed following the standard protocol without intravenous contrast.  Multiplanar CT image reconstructions of the cervical spine were also generated.  Comparison:  The 08/25/2008  CT HEAD  Findings: There is encephalomalacia within the inferior left frontal lobe which is either a remote infarction or the sequelae of remote trauma.  There is similar findings on the right to a lesser degree.  These are not significantly changed from prior head CT.  No acute intracranial hemorrhage.  No focal mass lesion.  No CT evidence of acute infarction.   No midline shift or mass effect. No hydrocephalus.  Basilar cisterns are patent.  There is generalized cortical atrophy.  IMPRESSION:  1.  No acute intracranial findings. 2. Encephalomalacia in the frontal lobes is likely relate to prior trauma or prior infarction. 3.  Atrophy similar to prior.  CT CERVICAL SPINE  Findings: There is no prevertebral soft tissue swelling.  There is bulky osteophytes from C4-C7.  Normal facet articulation.  Normal craniocervical junction.  No evidence epidural or paraspinal hematoma.  IMPRESSION: 1.  No evidence cervical spine fracture.  2.  Multilevel disc osteophytic disease.   Original Report Authenticated By: Genevive Bi, M.D.    Ct Cervical Spine Wo Contrast  12/18/2011  *RADIOLOGY REPORT*  Clinical Data:  Seizure,  found on ground.  CT HEAD WITHOUT CONTRAST CT CERVICAL SPINE WITHOUT CONTRAST  Technique:  Multidetector CT imaging of the head and cervical spine was performed following the standard protocol without intravenous contrast.  Multiplanar CT image reconstructions of the  cervical spine were also generated.  Comparison:  The 08/25/2008  CT HEAD  Findings: There is encephalomalacia within the inferior left frontal lobe which is either a remote infarction or the sequelae of remote trauma.  There is similar findings on the right to a lesser degree.  These are not significantly changed from prior head CT.  No acute intracranial hemorrhage.  No focal mass lesion.  No CT evidence of acute infarction.   No midline shift or mass effect. No hydrocephalus.  Basilar cisterns are patent.  There is generalized cortical atrophy.  IMPRESSION:  1.  No acute intracranial findings. 2. Encephalomalacia in the frontal lobes is likely relate to prior trauma or prior infarction. 3.  Atrophy similar to prior.  CT CERVICAL SPINE  Findings: There is no prevertebral soft tissue swelling.  There is bulky osteophytes from C4-C7.  Normal facet articulation.  Normal craniocervical junction.  No evidence epidural or paraspinal hematoma.  IMPRESSION: 1.  No evidence cervical spine fracture.  2.  Multilevel disc osteophytic disease.   Original Report Authenticated By: Genevive Bi, M.D.      1. Alcohol intoxication       MDM  Pt became aggressive and agitated when informed he had no blood in his ear.   Pt with no seizures in ED. Tolerating PO's. D/c home.       Loren Racer, MD 12/18/11 [phone]

## 2011-12-18 NOTE — ED Notes (Signed)
Discharge instructions reviewed w/ pt., verbalizes understanding. One prescription provided at discharge. 

## 2011-12-18 NOTE — ED Notes (Signed)
I gave the pt his 2 belonging bags and took him off the monitor. He stated to me what kind of hospital is this.

## 2011-12-26 ENCOUNTER — Encounter (HOSPITAL_COMMUNITY): Payer: Self-pay | Admitting: *Deleted

## 2011-12-26 ENCOUNTER — Emergency Department (HOSPITAL_COMMUNITY): Payer: Medicaid Other

## 2011-12-26 ENCOUNTER — Emergency Department (HOSPITAL_COMMUNITY)
Admission: EM | Admit: 2011-12-26 | Discharge: 2011-12-26 | Disposition: A | Discharge status: Home / Self Care | Payer: Medicaid Other | Attending: Emergency Medicine | Admitting: Emergency Medicine

## 2011-12-26 DIAGNOSIS — Z9119 Patient's noncompliance with other medical treatment and regimen: Secondary | ICD-10-CM | POA: Insufficient documentation

## 2011-12-26 DIAGNOSIS — Z9114 Patient's other noncompliance with medication regimen: Secondary | ICD-10-CM

## 2011-12-26 DIAGNOSIS — F445 Conversion disorder with seizures or convulsions: Secondary | ICD-10-CM

## 2011-12-26 DIAGNOSIS — R569 Unspecified convulsions: Secondary | ICD-10-CM | POA: Insufficient documentation

## 2011-12-26 DIAGNOSIS — F10929 Alcohol use, unspecified with intoxication, unspecified: Secondary | ICD-10-CM

## 2011-12-26 DIAGNOSIS — Z91148 Patient's other noncompliance with medication regimen for other reason: Secondary | ICD-10-CM

## 2011-12-26 DIAGNOSIS — Z91199 Patient's noncompliance with other medical treatment and regimen due to unspecified reason: Secondary | ICD-10-CM | POA: Insufficient documentation

## 2011-12-26 DIAGNOSIS — F1010 Alcohol abuse, uncomplicated: Secondary | ICD-10-CM | POA: Insufficient documentation

## 2011-12-26 HISTORY — DX: Aneurysm of unspecified site: I72.9

## 2011-12-26 LAB — CBC WITH DIFFERENTIAL/PLATELET
Basophils Absolute: 0 10*3/uL (ref 0.0–0.1)
Basophils Relative: 0 % (ref 0–1)
Eosinophils Absolute: 0 10*3/uL (ref 0.0–0.7)
Eosinophils Relative: 0 % (ref 0–5)
HCT: 36.8 % — ABNORMAL LOW (ref 39.0–52.0)
MCH: 37.4 pg — ABNORMAL HIGH (ref 26.0–34.0)
MCHC: 35.1 g/dL (ref 30.0–36.0)
MCV: 106.7 fL — ABNORMAL HIGH (ref 78.0–100.0)
Monocytes Absolute: 0.2 10*3/uL (ref 0.1–1.0)
RDW: 12.3 % (ref 11.5–15.5)

## 2011-12-26 LAB — BASIC METABOLIC PANEL
BUN: 10 mg/dL (ref 6–23)
Creatinine, Ser: 0.72 mg/dL (ref 0.50–1.35)
GFR calc Af Amer: >90 mL/min (ref >90)
GFR calc non Af Amer: >90 mL/min (ref >90)

## 2011-12-26 LAB — ETHANOL: Alcohol, Ethyl (B): 215 mg/dL — ABNORMAL HIGH (ref 0–11)

## 2011-12-26 MED ORDER — KETOROLAC TROMETHAMINE 60 MG/2ML IM SOLN
60.0000 mg | Freq: Once | INTRAMUSCULAR | Status: AC
  Administered 2011-12-26: 60 mg via INTRAMUSCULAR
  Filled 2011-12-26: qty 2

## 2011-12-26 MED ORDER — KETOROLAC TROMETHAMINE 30 MG/ML IJ SOLN: 30.0000 mg | Freq: Once | INTRAMUSCULAR | Status: DC

## 2011-12-26 MED ORDER — KETOROLAC TROMETHAMINE 30 MG/ML IJ SOLN
INTRAMUSCULAR | Status: AC
  Filled 2011-12-26: qty 2

## 2011-12-26 NOTE — ED Provider Notes (Signed)
History     CSN: [id number]  Arrival date & time 12/26/11  1749   First MD Initiated Contact with Patient 12/26/11 1828      Chief Complaint  Patient presents with  . Seizures    (Consider location/radiation/quality/duration/timing/severity/associated sxs/prior treatment) Patient is a 51 y.o. male presenting with seizures. The history is provided by the patient.  Seizures   HE says that he has been having multiple seizures today. He says that he has had with one of his seizures and is complaining of a headache. Is also complaining of pain in his right leg. He claims bowel and bladder incontinence and bit lip. He states he has been compliant with his carbamazepine although he missed his afternoon dose today. He denies alcohol ingestion. Of note, all seizures were witnessed.  Past Medical History  Diagnosis Date  . Seizure   . ETOHism   . Aneurysm     Past Surgical History  Procedure Date  . Cerebral aneurysm repair     At [facility]    No family history on file.  History  Substance Use Topics  . Smoking status: Current Every Day Smoker  . Smokeless tobacco: Not on file  . Alcohol Use: Yes     drin,ks everyday- beer- no liquor      Review of Systems  Neurological: Positive for seizures.  All other systems reviewed and are negative.    Allergies  Review of patient's allergies indicates no known allergies.  Home Medications   Current Outpatient Rx  Name Route Sig Dispense Refill  . CARBAMAZEPINE 200 MG PO TABS Oral Take 2 tablets (400 mg total) by mouth 3 (three) times daily. 84 tablet 0  . LOPRESSOR PO Oral Take 1 tablet by mouth 2 (two) times daily.     Marland Kitchen PRESCRIPTION MEDICATION Oral Take 1 capsule by mouth 3 (three) times daily. Additional seizure medication.      BP 134/83  Pulse 79  Temp 97.5 F (36.4 C) (Oral)  Resp 18  SpO2 96%  Physical Exam  Nursing note and vitals reviewed. 51 year old male, resting comfortably and in no acute  distress. Vital signs are normal. Oxygen saturation is 96%, which is normal. Head is normocephalic and atraumatic. PERRLA, EOMI. Fundi are normal. Oropharynx is clear. There is no evidence of trauma to the tongue, but there is a small abrasion to the lower lip. Neck is nontender and supple without adenopathy or JVD. Back is nontender and there is no CVA tenderness. Lungs are clear without rales, wheezes, or rhonchi. Chest is nontender. Heart has regular rate and rhythm without murmur. Abdomen is soft, flat, nontender without masses or hepatosplenomegaly and peristalsis is normoactive. Pants are not wet or soiled. Extremities have no cyanosis or edema, full range of motion is present. Skin is warm and dry without rash. Neurologic: Mental status is normal, cranial nerves are intact, there are no motor or sensory deficits.   ED Course  Procedures (including critical care time)  Results for orders placed during the hospital encounter of 12/26/11  CARBAMAZEPINE LEVEL, TOTAL      Component Value Range   Carbamazepine Lvl <0.5 (*) 4.0 - 12.0 ug/mL  ETHANOL      Component Value Range   Alcohol, Ethyl (B) 215 (*) 0 - 11 mg/dL  CBC WITH DIFFERENTIAL      Component Value Range   WBC 4.7  4.0 - 10.5 K/uL   RBC 3.45 (*) 4.22 - 5.81 MIL/uL   Hemoglobin  12.9 (*) 13.0 - 17.0 g/dL   HCT 09.8 (*) 11.9 - 14.7 %   MCV 106.7 (*) 78.0 - 100.0 fL   MCH 37.4 (*) 26.0 - 34.0 pg   MCHC 35.1  30.0 - 36.0 g/dL   RDW 82.9  56.2 - 13.0 %   Platelets 51 (*) 150 - 400 K/uL   Neutrophils Relative 35 (*) 43 - 77 %   Neutro Abs 1.7  1.7 - 7.7 K/uL   Lymphocytes Relative 60 (*) 12 - 46 %   Lymphs Abs 2.8  0.7 - 4.0 K/uL   Monocytes Relative 4  3 - 12 %   Monocytes Absolute 0.2  0.1 - 1.0 K/uL   Eosinophils Relative 0  0 - 5 %   Eosinophils Absolute 0.0  0.0 - 0.7 K/uL   Basophils Relative 0  0 - 1 %   Basophils Absolute 0.0  0.0 - 0.1 K/uL   RBC Morphology PAPPENHEIMER BODIES    BASIC METABOLIC PANEL       Component Value Range   Sodium 141  135 - 145 mEq/L   Potassium 4.1  3.5 - 5.1 mEq/L   Chloride 105  96 - 112 mEq/L   CO2 26  19 - 32 mEq/L   Glucose, Bld 100 (*) 70 - 99 mg/dL   BUN 10  6 - 23 mg/dL   Creatinine, Ser 8.65  0.50 - 1.35 mg/dL   Calcium 8.9  8.4 - 78.4 mg/dL   GFR calc non Af Amer >90  >90 mL/min   GFR calc Af Amer >90  >90 mL/min   Ct Head Wo Contrast  12/26/2011  *RADIOLOGY REPORT*  Clinical Data: Seizures  CT HEAD WITHOUT CONTRAST  Technique:  Contiguous axial images were obtained from the base of the skull through the vertex without contrast.  Comparison: 12/18/2011  Findings: Bilateral frontal lobe encephalomalacia is stable.  No mass effect, midline shift, or acute intracranial hemorrhage. Global atrophy.  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  Prior nasal bone fracture is stable.  No definite acute fracture.  Mastoid air cells are clear.  Nasal septum deviated to the left.  IMPRESSION: No acute intracranial pathology.  Chronic changes are noted.   Original Report Authenticated By: Donavan Burnet, M.D.     1. Pseudoseizure   2. Alcohol intoxication   3. Noncompliance with medications       MDM  Questionable seizure. He stated he had incontinence but is closer neither wet herself. There is a suggestion of a very slight abrasion of his lower lip which could have been from a bite. Old charts are reviewed and recent ED visits have been diagnosed with pseudoseizures and alcohol abuse. Alcohol level will be checked as well as carbamazepine level.  Alcohol level is come back at 215 and carbamazepine level 0. This is in direct contradiction to the patient's history of no alcohol ingestion an  claiming compliance with his medication. He was confronted with this and he admitted to drinking yesterday but not today and also density does not take his medication the way it is supposed to be taken. Review of prior records shows that his carbamazepine level has always  been undetectable when he presented to the emergency department. Also, patient was asking for something to eat a, but when he was given a sandwich, he complained that we need to give him something better than the cold [place] sandwich. When I confronted him about his lab results, and  he demanded to be seen by another physician. He was informed that I was a physician who would be taking care of him and that he would be discharged.     Dione Booze, MD 12/26/11 2036

## 2011-12-26 NOTE — ED Notes (Signed)
Pt requested food, [place] sandwich given per md.

## 2011-12-26 NOTE — ED Notes (Signed)
Pt has history of seizures and history of brain aneurysm and nonwitnessed seizure.  Pt has etoh on board.  Very loud at triage

## 2011-12-26 NOTE — ED Notes (Signed)
Pt has had several seizures today and states he had a seizures and hit head and his having a bad headache.   PT takes tegretol for seizures and states that he last had it this am.  Pt has some slurred speech.

## 2011-12-26 NOTE — ED Notes (Signed)
Requested that pt get out of wheelchair and into stretcher, pt states "YOU JUST GOT ME IN MY CHAIR NOW YOU WANT ME OUT!?" Explained to pt that the stretcher would be more comfortable and that it was more appropriate since he was in the ED. Pt states "Is this a hospital?! Or is this one of them places!?" Explained to pt he was at [facility]. Pt continues to be verbally abusive to staff.

## 2011-12-28 ENCOUNTER — Emergency Department (HOSPITAL_COMMUNITY)
Admission: EM | Admit: 2011-12-28 | Discharge: 2011-12-29 | Disposition: A | Discharge status: Home / Self Care | Payer: Medicaid Other | Attending: Emergency Medicine | Admitting: Emergency Medicine

## 2011-12-28 ENCOUNTER — Encounter (HOSPITAL_COMMUNITY): Payer: Self-pay | Admitting: Family Medicine

## 2011-12-28 DIAGNOSIS — R569 Unspecified convulsions: Secondary | ICD-10-CM

## 2011-12-28 DIAGNOSIS — F1010 Alcohol abuse, uncomplicated: Secondary | ICD-10-CM | POA: Insufficient documentation

## 2011-12-28 DIAGNOSIS — F10929 Alcohol use, unspecified with intoxication, unspecified: Secondary | ICD-10-CM

## 2011-12-28 DIAGNOSIS — R51 Headache: Secondary | ICD-10-CM | POA: Insufficient documentation

## 2011-12-28 DIAGNOSIS — G40909 Epilepsy, unspecified, not intractable, without status epilepticus: Secondary | ICD-10-CM | POA: Insufficient documentation

## 2011-12-28 DIAGNOSIS — Z79899 Other long term (current) drug therapy: Secondary | ICD-10-CM | POA: Insufficient documentation

## 2011-12-28 LAB — URINALYSIS, ROUTINE W REFLEX MICROSCOPIC
Glucose, UA: NEGATIVE mg/dL
Leukocytes, UA: NEGATIVE
Protein, ur: NEGATIVE mg/dL
Specific Gravity, Urine: 1.004 — ABNORMAL LOW (ref 1.005–1.030)
pH: 5.5 (ref 5.0–8.0)

## 2011-12-28 LAB — POCT I-STAT, CHEM 8
Chloride: 108 mEq/L (ref 96–112)
Creatinine, Ser: 1 mg/dL (ref 0.50–1.35)
HCT: 39 % (ref 39.0–52.0)
Hemoglobin: 13.3 g/dL (ref 13.0–17.0)
Potassium: 4.8 mEq/L (ref 3.5–5.1)
Sodium: 144 mEq/L (ref 135–145)

## 2011-12-28 LAB — CBC
HCT: 34.3 % — ABNORMAL LOW (ref 39.0–52.0)
Hemoglobin: 12.2 g/dL — ABNORMAL LOW (ref 13.0–17.0)
RBC: 3.26 MIL/uL — ABNORMAL LOW (ref 4.22–5.81)
RDW: 12.4 % (ref 11.5–15.5)
WBC: 4.2 10*3/uL (ref 4.0–10.5)

## 2011-12-28 NOTE — ED Notes (Signed)
Per EMS, pt was found on side of road. Pt c/o "multiple seizures".  No seizures witnessed by EMS.  Per EMS, CBG 112, BP 120/70, Pulse 80.  Pt. C/o headache. Pt states has not taken seizure medications x3 days.  Pt smells of ETOH.

## 2011-12-29 ENCOUNTER — Encounter (HOSPITAL_COMMUNITY): Payer: Self-pay | Admitting: Emergency Medicine

## 2011-12-29 MED ORDER — CARBAMAZEPINE 200 MG PO TABS: 400.0000 mg | ORAL_TABLET | Freq: Three times a day (TID) | ORAL | Status: DC

## 2011-12-29 NOTE — ED Notes (Signed)
Asked patient if he had a ride home that he needed to call, that the dr was preparing his paperwork for discharge. Patient angrily stated, "I ain't got no ride home, that's why I don't understand why he waited so late."

## 2011-12-29 NOTE — ED Notes (Signed)
Pt was in room given meals x 2 with sandwich heated. Pt kept saying he didn't get fed.

## 2011-12-29 NOTE — ED Notes (Signed)
Pt was given heated sandwich.

## 2011-12-29 NOTE — ED Notes (Signed)
During report it was stated that pt "did not like small boys and girls."

## 2011-12-29 NOTE — ED Provider Notes (Signed)
History     CSN: [id number]  Arrival date & time 12/28/11  1734   First MD Initiated Contact with Patient 12/28/11 1808      Chief Complaint  Patient presents with  . Seizures    (Consider location/radiation/quality/duration/timing/severity/associated sxs/prior treatment) Patient is a 51 y.o. male presenting with seizures. The history is provided by the patient and the EMS personnel.  Seizures  This is a chronic problem. The current episode started less than 1 hour ago. The problem has been resolved. There were 2 to 3 (Stated as multiple) seizures. Associated symptoms include headaches. Pertinent negatives include no visual disturbance, no chest pain, no nausea and no vomiting. Characteristics include rhythmic jerking. Characteristics do not include bowel incontinence, bladder incontinence or bit tongue. The episode was not witnessed. There was no sensation of an aura present. The seizures did not continue in the ED.   EMS found on side of road. Patient complained of multiple seizures, but no seizures witnessed by EMS. Blood sugar 112, BP 120/70. C/o headache. Stated that he has not taken his seizure medication for 3 days. EMS noted he smelled of ETOH.   Past Medical History  Diagnosis Date  . Seizure   . ETOHism   . Aneurysm     Past Surgical History  Procedure Date  . Cerebral aneurysm repair     At [facility]    No family history on file.  History  Substance Use Topics  . Smoking status: Current Every Day Smoker    Types: Cigarettes  . Smokeless tobacco: Not on file  . Alcohol Use: Yes     drinks everyday- beer- no liquor      Review of Systems  Constitutional: Negative for fever.  HENT: Negative for congestion and neck pain.   Eyes: Negative for visual disturbance.  Respiratory: Negative for shortness of breath.   Cardiovascular: Negative for chest pain.  Gastrointestinal: Negative for nausea, vomiting, abdominal pain and bowel incontinence.    Genitourinary: Negative for bladder incontinence and dysuria.  Musculoskeletal: Negative for back pain.  Skin: Negative for rash.  Neurological: Positive for seizures and headaches. Negative for facial asymmetry, weakness and numbness.  Hematological: Does not bruise/bleed easily.    Allergies  Review of patient's allergies indicates no known allergies.  Home Medications   Current Outpatient Rx  Name Route Sig Dispense Refill  . CARBAMAZEPINE 200 MG PO TABS Oral Take 2 tablets (400 mg total) by mouth 3 (three) times daily. 84 tablet 0  . METOPROLOL TARTRATE 100 MG PO TABS Oral Take 100 mg by mouth 2 (two) times daily.    Marland Kitchen PRESCRIPTION MEDICATION Oral Take 1 capsule by mouth 3 (three) times daily. Additional seizure medication.      BP 109/70  Pulse 73  Temp 97.9 F (36.6 C) (Oral)  Resp 25  Ht 6\' 6"  (1.981 m)  Wt 195 lb (88.451 kg)  BMI 22.53 kg/m2  SpO2 93%  Physical Exam  Nursing note and vitals reviewed. Constitutional: He is oriented to person, place, and time. He appears well-developed and well-nourished. No distress.  HENT:  Head: Normocephalic and atraumatic.  Mouth/Throat: Oropharynx is clear and moist.       No tongue injury  Eyes: Conjunctivae normal and EOM are normal. Pupils are equal, round, and reactive to light.  Neck: Normal range of motion. Neck supple.  Cardiovascular: Normal rate, regular rhythm and normal heart sounds.   No murmur heard. Pulmonary/Chest: Effort normal and breath sounds normal.  Abdominal:  Soft. Bowel sounds are normal. There is no tenderness.  Musculoskeletal: Normal range of motion. He exhibits no edema.  Neurological: He is alert and oriented to person, place, and time. No cranial nerve deficit. He exhibits normal muscle tone. Coordination normal.  Skin: No rash noted.    ED Course  Procedures (including critical care time)  Labs Reviewed  CARBAMAZEPINE LEVEL, TOTAL - Abnormal; Notable for the following:    Carbamazepine  Lvl <0.5 (*)     All other components within normal limits  URINALYSIS, ROUTINE W REFLEX MICROSCOPIC - Abnormal; Notable for the following:    APPearance CLOUDY (*)     Specific Gravity, Urine 1.004 (*)     All other components within normal limits  CBC - Abnormal; Notable for the following:    RBC 3.26 (*)     Hemoglobin 12.2 (*)     HCT 34.3 (*)     MCV 105.2 (*)     MCH 37.4 (*)     Platelets 72 (*)     All other components within normal limits  ETHANOL - Abnormal; Notable for the following:    Alcohol, Ethyl (B) 232 (*)     All other components within normal limits  POCT I-STAT, CHEM 8 - Abnormal; Notable for the following:    BUN 5 (*)     Glucose, Bld 104 (*)     Calcium, Ion 1.07 (*)     All other components within normal limits   No results found. Results for orders placed during the hospital encounter of 12/28/11  CARBAMAZEPINE LEVEL, TOTAL      Component Value Range   Carbamazepine Lvl <0.5 (*) 4.0 - 12.0 ug/mL  URINALYSIS, ROUTINE W REFLEX MICROSCOPIC      Component Value Range   Color, Urine YELLOW  YELLOW   APPearance CLOUDY (*) CLEAR   Specific Gravity, Urine 1.004 (*) 1.005 - 1.030   pH 5.5  5.0 - 8.0   Glucose, UA NEGATIVE  NEGATIVE mg/dL   Hgb urine dipstick NEGATIVE  NEGATIVE   Bilirubin Urine NEGATIVE  NEGATIVE   Ketones, ur NEGATIVE  NEGATIVE mg/dL   Protein, ur NEGATIVE  NEGATIVE mg/dL   Urobilinogen, UA 1.0  0.0 - 1.0 mg/dL   Nitrite NEGATIVE  NEGATIVE   Leukocytes, UA NEGATIVE  NEGATIVE  CBC      Component Value Range   WBC 4.2  4.0 - 10.5 K/uL   RBC 3.26 (*) 4.22 - 5.81 MIL/uL   Hemoglobin 12.2 (*) 13.0 - 17.0 g/dL   HCT 45.4 (*) 09.8 - 11.9 %   MCV 105.2 (*) 78.0 - 100.0 fL   MCH 37.4 (*) 26.0 - 34.0 pg   MCHC 35.6  30.0 - 36.0 g/dL   RDW 14.7  82.9 - 56.2 %   Platelets 72 (*) 150 - 400 K/uL  ETHANOL      Component Value Range   Alcohol, Ethyl (B) 232 (*) 0 - 11 mg/dL  POCT I-STAT, CHEM 8      Component Value Range   Sodium 144  135 -  145 mEq/L   Potassium 4.8  3.5 - 5.1 mEq/L   Chloride 108  96 - 112 mEq/L   BUN 5 (*) 6 - 23 mg/dL   Creatinine, Ser 1.30  0.50 - 1.35 mg/dL   Glucose, Bld 865 (*) 70 - 99 mg/dL   Calcium, Ion 7.84 (*) 1.12 - 1.23 mmol/L   TCO2 23  0 - 100 mmol/L  Hemoglobin 13.3  13.0 - 17.0 g/dL   HCT 16.1  09.6 - 04.5 %    Date: 12/29/2011  Rate: 68  Rhythm: normal sinus rhythm  QRS Axis: normal  Intervals: normal  ST/T Wave abnormalities: nonspecific ST/T changes  Conduction Disutrbances:none  Narrative Interpretation:   Old EKG Reviewed: none available    1. Seizure   2. Alcohol intoxication       MDM  The patient found on the side of the road brought in by EMS reportedly had multiple seizures. No seizures were witnessed by EMS at the scene patient's blood sugar was 112 vital signs blood pressure was 120/70 pulse was 80 patient was complaining of headache. Patient states has not taken his seizure medications for the past 3 days patient smelled of alcohol is known to be a heavy drinker.  Upon arrival to the emergency department the patient requested food. No significant complaints other than he did complain of a headache which she says baseline for him. He refused a head CT. Does have a history of a cerebral aneurysm repair at [facility] in the past. Patient's labs showed is not really taking his antiseizure medicines however he was intoxicated with alcohol. Functioning very well in ED and at discharge.   Patient will be discharged home.        Shelda Jakes, MD 12/29/11 [phone]

## 2012-01-14 ENCOUNTER — Emergency Department (HOSPITAL_COMMUNITY): Payer: Medicaid Other

## 2012-01-14 ENCOUNTER — Encounter (HOSPITAL_COMMUNITY): Payer: Self-pay | Admitting: Emergency Medicine

## 2012-01-14 ENCOUNTER — Emergency Department (HOSPITAL_COMMUNITY)
Admission: EM | Admit: 2012-01-14 | Discharge: 2012-01-14 | Payer: Medicaid Other | Attending: Emergency Medicine | Admitting: Emergency Medicine

## 2012-01-14 DIAGNOSIS — F17200 Nicotine dependence, unspecified, uncomplicated: Secondary | ICD-10-CM | POA: Insufficient documentation

## 2012-01-14 DIAGNOSIS — I729 Aneurysm of unspecified site: Secondary | ICD-10-CM | POA: Insufficient documentation

## 2012-01-14 DIAGNOSIS — Z79899 Other long term (current) drug therapy: Secondary | ICD-10-CM | POA: Insufficient documentation

## 2012-01-14 DIAGNOSIS — G40909 Epilepsy, unspecified, not intractable, without status epilepticus: Secondary | ICD-10-CM | POA: Insufficient documentation

## 2012-01-14 DIAGNOSIS — L0291 Cutaneous abscess, unspecified: Secondary | ICD-10-CM | POA: Insufficient documentation

## 2012-01-14 DIAGNOSIS — L0390 Cellulitis, unspecified: Secondary | ICD-10-CM

## 2012-01-14 DIAGNOSIS — F1020 Alcohol dependence, uncomplicated: Secondary | ICD-10-CM | POA: Insufficient documentation

## 2012-01-14 DIAGNOSIS — K029 Dental caries, unspecified: Secondary | ICD-10-CM

## 2012-01-14 LAB — CBC WITH DIFFERENTIAL/PLATELET
Hemoglobin: 12.1 g/dL — ABNORMAL LOW (ref 13.0–17.0)
Lymphs Abs: 2.6 10*3/uL (ref 0.7–4.0)
MCH: 37.5 pg — ABNORMAL HIGH (ref 26.0–34.0)
Monocytes Relative: 19 % — ABNORMAL HIGH (ref 3–12)
Neutro Abs: 1.4 10*3/uL — ABNORMAL LOW (ref 1.7–7.7)
Neutrophils Relative %: 28 % — ABNORMAL LOW (ref 43–77)
RBC: 3.23 MIL/uL — ABNORMAL LOW (ref 4.22–5.81)

## 2012-01-14 LAB — BASIC METABOLIC PANEL
BUN: 10 mg/dL (ref 6–23)
Chloride: 99 mEq/L (ref 96–112)
Glucose, Bld: 98 mg/dL (ref 70–99)
Potassium: 4.9 mEq/L (ref 3.5–5.1)

## 2012-01-14 MED ORDER — DEXTROSE 5 % IV SOLN
1.0000 g | INTRAVENOUS | Status: DC
  Administered 2012-01-14: 1 g via INTRAVENOUS
  Filled 2012-01-14: qty 10

## 2012-01-14 MED ORDER — AMOXICILLIN-POT CLAVULANATE 875-125 MG PO TABS: 1.0000 | ORAL_TABLET | Freq: Two times a day (BID) | ORAL | Status: DC

## 2012-01-14 MED ORDER — HYDROCODONE-ACETAMINOPHEN 5-325 MG PO TABS
1.0000 | ORAL_TABLET | Freq: Once | ORAL | Status: AC
  Administered 2012-01-14: 1 via ORAL
  Filled 2012-01-14: qty 1

## 2012-01-14 MED ORDER — METOPROLOL TARTRATE 25 MG PO TABS
100.0000 mg | ORAL_TABLET | Freq: Two times a day (BID) | ORAL | Status: DC
  Administered 2012-01-14: 100 mg via ORAL
  Filled 2012-01-14: qty 1
  Filled 2012-01-14: qty 3

## 2012-01-14 MED ORDER — IOHEXOL 300 MG/ML  SOLN
100.0000 mL | Freq: Once | INTRAMUSCULAR | Status: AC | PRN
  Administered 2012-01-14: 100 mL via INTRAVENOUS

## 2012-01-14 MED ORDER — CARBAMAZEPINE 200 MG PO TABS
400.0000 mg | ORAL_TABLET | Freq: Three times a day (TID) | ORAL | Status: DC
  Administered 2012-01-14: 400 mg via ORAL
  Filled 2012-01-14: qty 2

## 2012-01-14 MED ORDER — HYDROCODONE-ACETAMINOPHEN 5-325 MG PO TABS: 1.0000 | ORAL_TABLET | Freq: Four times a day (QID) | ORAL | Status: DC | PRN

## 2012-01-14 NOTE — ED Notes (Signed)
RN to obtain labs with start of IV 

## 2012-01-14 NOTE — ED Provider Notes (Signed)
History     CSN: [id number] Arrival date & time 01/14/12  1534 First MD Initiated Contact with Patient 01/14/12 1548     Chief Complaint  Patient presents with  . Facial Swelling    The history is provided by the patient.   patient presents with complaints of swelling around his left eye associated with some reddish drainage. Patient woke up this morning with an area of swelling inferior to his left thigh. The left medial aspect of the eyelid margin is also swollen, erythematous and tender. The swelling seems to extend down to his cheek. Patient denies any fevers. He denies any trauma. The area is tender and painful to the touch. Patient denies any changes in his vision. He is having any recent stye or any other ocular trouble.  Past Medical History  Diagnosis Date  . Seizure   . ETOHism   . Aneurysm     Past Surgical History  Procedure Date  . Cerebral aneurysm repair     At [facility]    No family history on file.  History  Substance Use Topics  . Smoking status: Current Every Day Smoker    Types: Cigarettes  . Smokeless tobacco: Not on file  . Alcohol Use: Yes     drinks everyday- beer- no liquor      Review of Systems  All other systems reviewed and are negative.    Allergies  Review of patient's allergies indicates no known allergies.  Home Medications   Current Outpatient Rx  Name Route Sig Dispense Refill  . CARBAMAZEPINE 200 MG PO TABS Oral Take 2 tablets (400 mg total) by mouth 3 (three) times daily. 84 tablet 0  . CARBAMAZEPINE 200 MG PO TABS Oral Take 2 tablets (400 mg total) by mouth 3 (three) times daily. 60 tablet 0  . METOPROLOL TARTRATE 100 MG PO TABS Oral Take 100 mg by mouth 2 (two) times daily.      BP 115/78  Pulse 65  Temp 97.8 F (36.6 C) (Oral)  Resp 16  SpO2 97%  Physical Exam  Nursing note and vitals reviewed. Constitutional: He appears well-developed and well-nourished. No distress.  HENT:  Head: Normocephalic and  atraumatic.  Right Ear: External ear normal.  Left Ear: External ear normal.  Eyes: Conjunctivae normal are normal. Right eye exhibits no discharge. Left eye exhibits no discharge. No scleral icterus.         Erythema on the left cheek inferiorly, extraocular movements intact, no proptosis  Neck: Neck supple. No tracheal deviation present.  Cardiovascular: Normal rate, regular rhythm and intact distal pulses.   Pulmonary/Chest: Effort normal and breath sounds normal. No stridor. No respiratory distress. He has no wheezes. He has no rales.  Abdominal: Soft. Bowel sounds are normal. He exhibits no distension. There is no tenderness. There is no rebound and no guarding.  Musculoskeletal: He exhibits no edema and no tenderness.  Neurological: He is alert. He has normal strength. No sensory deficit. Cranial nerve deficit:  no gross defecits noted. He exhibits normal muscle tone. He displays no seizure activity. Coordination normal.  Skin: Skin is warm and dry. No rash noted.  Psychiatric: He has a normal mood and affect.    ED Course  INCISION AND DRAINAGE Performed by: Linwood Dibbles R Authorized by: Linwood Dibbles R Consent: Verbal consent obtained. Type: abscess Body area: head/neck Patient sedated: no Needle gauge: 18 Drainage amount: scant Wound treatment: wound left open    Labs Reviewed  CBC  WITH DIFFERENTIAL - Abnormal; Notable for the following:    RBC 3.23 (*)     Hemoglobin 12.1 (*)     HCT 35.2 (*)     MCV 109.0 (*)     MCH 37.5 (*)     Platelets 53 (*)     Neutrophils Relative 28 (*)     Neutro Abs 1.4 (*)     Lymphocytes Relative 53 (*)     Monocytes Relative 19 (*)     All other components within normal limits  BASIC METABOLIC PANEL   Ct Maxillofacial W/cm  01/14/2012  *RADIOLOGY REPORT*  Clinical Data: 51 year old male with left orbital swelling and drainage.  CT MAXILLOFACIAL WITH CONTRAST  Technique:  Multidetector CT imaging of the maxillofacial structures was  performed with intravenous contrast. Multiplanar CT image reconstructions were also generated.  Contrast:  100 ml Omnipaque-300.  Comparison: Head CTs without contrast 12/26/2011 and earlier.  Findings: Preseptal left periorbital soft tissue thickening and swelling.  There is 9 mm hypodense area at the inferior medial margin of the left orbit (series 3 image 53).  No postseptal inflammation.  Left globe within normal limits.  Retrobulbar are left orbital soft tissues within normal limits.  Cavernous sinus enhancement appears within normal limits.  Stable visualized brain parenchyma, inferior bifrontal encephalomalacia.  Extensive plaque at the left carotid bifurcation and left ICA origin.  The cervical left ICA is occluded shortly beyond its origin, with no reconstitution identified in the neck or at the skull base.  There does appear to be reconstituted flow in the supraclinoid left ICA.  Other visualized major vascular structures are patent.  Negative visualized retropharyngeal, parapharyngeal, sublingual spaces, submandibular glands, parotid, and pharyngeal contours.  No lymphadenopathy identified in the face.  Visualized paranasal sinuses and mastoids are clear.  Intermittent severe peri apical dental lucency and large dental caries. Degenerative changes in the cervical spine.  Chronic left lamina papyracea fracture.  Chronic nasal bone fractures.  Right orbit soft tissues are within normal limits.  IMPRESSION: 1.  Preseptal left orbital inflammatory changes.  9 mm hypodense area along the inferior medial left orbit suspicious for developing abscess.  This may not yet be drainable. 2.  No postseptal inflammation or other complicating features. 3. 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 Intracranial reconstituted flow identified. 4.  Severe dental caries, recommend dental follow up.   Original Report Authenticated By: Harley Hallmark, M.D.      1. Cellulitis   2. Dental caries       MDM   Patient appears to have a cellulitis surrounding a small suspicious developing abscesss lesion.  Patient denies any fevers or any significant pain. And given dose of IV antibiotics. I did attempt to aspirate with an 18-gauge and there is a small amount of pus removed. Patient feels comfortable going back to jail where he will be monitored. I do think he will need to be rechecked within 24-48 hours.  if this does not improve he may need additional IV antibiotics and admission.       Celene Kras, MD 01/15/12 [phone]

## 2012-01-14 NOTE — ED Notes (Signed)
Pt presents to ED with L eye swelling and red drainage.  Denies injury. Pt states that he woke this morning  and his eye was swollen.  Pt brought in by [employer] department from [employer].

## 2012-03-24 ENCOUNTER — Emergency Department (HOSPITAL_COMMUNITY)
Admission: EM | Admit: 2012-03-24 | Discharge: 2012-03-24 | Payer: Medicaid Other | Attending: Emergency Medicine | Admitting: Emergency Medicine

## 2012-03-24 ENCOUNTER — Encounter (HOSPITAL_COMMUNITY): Payer: Self-pay | Admitting: Emergency Medicine

## 2012-03-24 DIAGNOSIS — F1010 Alcohol abuse, uncomplicated: Secondary | ICD-10-CM | POA: Insufficient documentation

## 2012-03-24 DIAGNOSIS — Z8669 Personal history of other diseases of the nervous system and sense organs: Secondary | ICD-10-CM | POA: Insufficient documentation

## 2012-03-24 DIAGNOSIS — Z79899 Other long term (current) drug therapy: Secondary | ICD-10-CM | POA: Insufficient documentation

## 2012-03-24 DIAGNOSIS — F17200 Nicotine dependence, unspecified, uncomplicated: Secondary | ICD-10-CM | POA: Insufficient documentation

## 2012-03-24 DIAGNOSIS — F10929 Alcohol use, unspecified with intoxication, unspecified: Secondary | ICD-10-CM

## 2012-03-24 DIAGNOSIS — G40909 Epilepsy, unspecified, not intractable, without status epilepticus: Secondary | ICD-10-CM | POA: Insufficient documentation

## 2012-03-24 LAB — ETHANOL: Alcohol, Ethyl (B): 308 mg/dL — ABNORMAL HIGH (ref 0–11)

## 2012-03-24 LAB — CARBAMAZEPINE LEVEL, TOTAL: Carbamazepine Lvl: 2.8 ug/mL — ABNORMAL LOW (ref 4.0–12.0)

## 2012-03-24 MED ORDER — SODIUM CHLORIDE 0.9 % IV SOLN
Freq: Once | INTRAVENOUS | Status: AC
  Administered 2012-03-24: 13:00:00 via INTRAVENOUS

## 2012-03-24 NOTE — ED Notes (Signed)
Pt belongings placed in lockers 30 and 33 .

## 2012-03-24 NOTE — ED Notes (Signed)
Pt is up for d/c to go home.Pt is refuseing  to leave he states that he does not want to go home and requested to speak with the MD. MD Lynelle Doctor is at the bedside. And explained to the Pt that he is  medically cleared  to go home. V/s stable IV d/c no s/s of SZ activity . The Pt is becoming verbally aggressive, and using profanity and making threats to the staff members. The Pt's d/c papers where given with a bus pass. The Pt is refusing the bus pass. [employer] Officer and Security called.Pt informed that if he does not leave the  Campus he would be charge with trespassing. The Pt stated that he  would rather  go to jail than take the bus. Pt escorted via [employer] Officer off campus with belongings.

## 2012-03-24 NOTE — ED Notes (Addendum)
Pt has 5 belonging bags at nurses station consisting of multiple coats and sweatshirts, pair of jeans, boots, 3 pairs of socks.

## 2012-03-24 NOTE — ED Notes (Addendum)
Pt had episode of urinary incontinence, full linen change and dry scrubs and non slip socks  provided.

## 2012-03-24 NOTE — ED Provider Notes (Signed)
History     CSN: [id number]  Arrival date & time 03/24/12  1156   First MD Initiated Contact with Patient 03/24/12 1203      Chief Complaint  Patient presents with  . Alcohol Intoxication    (Consider location/radiation/quality/duration/timing/severity/associated sxs/prior treatment) HPI Comments: Patient was reportedly found passed out on the ground and bystander alerted authorities. Patient is brought in. The patient has a presumed history of seizure disorder as well as alcoholism. No identification was found patient but he did have a pill bottle with blood pressure medication with his name and also a filled bottle of hand sanitizer gel. Based on prior records, the patient is supposed to be on metoprolol as well as Tegretol. The patient is awake, alert, marginally cooperative here on exam. He denies any areas of pain. He denies any nausea or vomiting. Otherwise level V caveat due to to patient's presumptive intoxication.  Patient is a 52 y.o. male presenting with intoxication. The history is provided by the patient and medical records.  Alcohol Intoxication    Past Medical History  Diagnosis Date  . Seizure   . ETOHism   . Aneurysm     Past Surgical History  Procedure Date  . Cerebral aneurysm repair     At [facility]    No family history on file.  History  Substance Use Topics  . Smoking status: Current Every Day Smoker    Types: Cigarettes  . Smokeless tobacco: Not on file  . Alcohol Use: Yes     Comment: drinks everyday- beer- no liquor      Review of Systems  Unable to perform ROS: Other    Allergies  Review of patient's allergies indicates no known allergies.  Home Medications   Current Outpatient Rx  Name  Route  Sig  Dispense  Refill  . AMOXICILLIN-POT CLAVULANATE 875-125 MG PO TABS   Oral   Take 1 tablet by mouth 2 (two) times daily.   20 tablet   0   . CARBAMAZEPINE 200 MG PO TABS   Oral   Take 400 mg by mouth 3 (three) times daily.        Marland Kitchen HYDROCODONE-ACETAMINOPHEN 5-325 MG PO TABS   Oral   Take 1-2 tablets by mouth every 6 (six) hours as needed for pain.   16 tablet   0   . METOPROLOL TARTRATE 100 MG PO TABS   Oral   Take 100 mg by mouth 2 (two) times daily.         Marland Kitchen PRESCRIPTION MEDICATION   Oral   Take 1 tablet by mouth 3 (three) times daily. Medication for seizures, unsure of the name.           BP 119/76  Pulse 66  Temp 97.5 F (36.4 C) (Oral)  Resp 16  SpO2 96%  Physical Exam  Nursing note and vitals reviewed. Constitutional: He appears well-developed and well-nourished. He is uncooperative.  HENT:  Head: Normocephalic and atraumatic.  Cardiovascular: Normal rate.   Pulmonary/Chest: Effort normal. No respiratory distress.  Abdominal: Soft.  Neurological: He is alert.       No facial droop, moves 4 extremities equally and symmetrically  Skin: Skin is warm and dry.  Psychiatric: His affect is inappropriate. His speech is slurred. He is slowed. He is not combative. He expresses inappropriate judgment.    ED Course  Procedures (including critical care time)  Labs Reviewed  ETHANOL - Abnormal; Notable for the following:    Alcohol,  Ethyl (B) 308 (*)     All other components within normal limits  CARBAMAZEPINE LEVEL, TOTAL   No results found.   1. Alcohol intoxication     ra sat is 96% and i interpret to be normal  Pt is in no respiratory distress.  Sleeping, arousable to noxious stimuli, protects airway.  ETOH is 308.  Pt seems to be homeless.  Will allow some time to sober and if he can ambulate safely, will discharge.  Otherwise will sign out to oncoming attending to monitor for improvement.      MDM  Pt appears to be intoxicated, no obv signs of trauma.  Being post ictal is also possible.  I question whether pt's seizure disorder is related to alcoholism or withdrawal.  An EEG done by Dr. Pearlean Brownie on 12/29/09 showed no epilepsy and otherwise was WNL.  Will monitor here in the  ED.  He doesn't request detox or help with alcoholism at this time.          Gavin Pound. Islah Eve, MD 03/24/12 1504

## 2012-03-24 NOTE — ED Notes (Addendum)
PER EMS- Bystander was walking by and found pt on [address] passed out on the side of rode lying in grass. Pt is combative and uncooperative on arrival.  Pt is alert but not oriented to time and place.  Pt refusing to provide name and take off clothes. Appears to be disheveled and intoxicated.  Instant hand sanitizer 5 oz bottle and metoprolol brought in by EMS, was found in pt's pocket.   Pt stated "I had a seizure, why am I here?"  Pt has hx of seizures and alcoholism.

## 2012-03-24 NOTE — Discharge Instructions (Signed)
 Alcohol Intoxication You have alcohol intoxication when the amount of alcohol that you have consumed has impaired your ability to mentally and physically function. There are a variety of factors that contribute to the level at which alcohol intoxication can occur, such as age, gender, weight, frequency of alcohol consumption, medication use, and the presence of other medical conditions, such as diabetes, seizures, or heart conditions. The blood alcohol level test measures the concentration of alcohol in your blood. In most states, your blood alcohol level must be lower than 80 mg/dL (9.91%) to legally drive. However, many dangerous effects of alcohol can occur at much lower levels. Alcohol directly impairs the normal chemical activity of the brain and is said to be a chemical depressant. Alcohol can cause drowsiness, stupor, respiratory failure, and coma. Other physical effects can include headache, vomiting, vomiting of blood, abdominal pain, a fast heartbeat, difficulty breathing, anxiety, and amnesia. Alcohol intoxication can also lead to dangerous and life-threatening activities, such as fighting, dangerous operation of vehicles or heavy machinery, and risky sexual behavior. Alcohol can be especially dangerous when taken with other drugs. Some of these drugs are:  Sedatives.  Painkillers.  Marijuana.  Tranquilizers.  Antihistamines.  Muscle relaxants.  Seizure medicine. Many of the effects of acute alcohol intoxication are temporary. However, repeated alcohol intoxication can lead to severe medical illnesses. If you have alcohol intoxication, you should:  Stay hydrated. Drink enough water  and fluids to keep your urine clear or pale yellow. Avoid excessive caffeine because this can further lead to dehydration.  Eat a healthy diet. You may have residual nausea, headache, and loss of appetite, but it is still important that you maintain good nutrition. You can start with clear  liquids.  Take nonsteroidal anti-inflammatory medications as needed for headaches, but make sure to do so with small meals. You should avoid acetaminophen  for several days after having alcohol intoxication because the combination of alcohol and acetaminophen  can be toxic to your liver. If you have frequent alcohol intoxication, ask your friends and family if they think you have a drinking problem. For further help, contact:  Your caregiver.  Alcoholics Anonymous (AA).  A drug or alcohol rehabilitation program. SEEK MEDICAL CARE IF:   You have persistent vomiting.  You have persistent pain in any part of your body.  You do not feel better after a few days. SEEK IMMEDIATE MEDICAL CARE IF:   You become shaky or tremble when you try to stop drinking.  You shake uncontrollably (seizure).  You throw up (vomit) blood. This may be bright red or it may look like black coffee grounds.  You have blood in the stool. This may be bright red or appear as a black, tarry, bad smelling stool.  You become lightheaded or faint. ANY OF THESE SYMPTOMS MAY REPRESENT A SERIOUS PROBLEM THAT IS AN EMERGENCY. Do not wait to see if the symptoms will go away. Get medical help right away. Call your local emergency services (911 in [place] DO NOT drive yourself to the hospital. MAKE SURE YOU:   Understand these instructions.  Will watch your condition.  Will get help right away if you are not doing well or get worse. Document Released: 12/15/2004 Document Revised: 05/30/2011 Document Reviewed: 08/24/2009 [facility] Patient Information 2013 [place], [place].    Alcohol Problems Most adults who drink alcohol drink in moderation (not a lot) are at low risk for developing problems related to their drinking. However, all drinkers, including low-risk drinkers, should know about the health risks  connected with drinking alcohol. RECOMMENDATIONS FOR LOW-RISK DRINKING  Drink in moderation. Moderate drinking is  defined as follows:   Men - no more than 2 drinks per day.  Nonpregnant women - no more than 1 drink per day.  Over age 67 - no more than 1 drink per day. A standard drink is 12 grams of pure alcohol, which is equal to a 12 ounce bottle of beer or wine cooler, a 5 ounce glass of wine, or 1.5 ounces of distilled spirits (such as whiskey, brandy, vodka, or rum).  ABSTAIN FROM (DO NOT DRINK) ALCOHOL:  When pregnant or considering pregnancy.  When taking a medication that interacts with alcohol.  If you are alcohol dependent.  A medical condition that prohibits drinking alcohol (such as ulcer, liver disease, or heart disease). DISCUSS WITH YOUR CAREGIVER:  If you are at risk for coronary heart disease, discuss the potential benefits and risks of alcohol use: Light to moderate drinking is associated with lower rates of coronary heart disease in certain populations (for example, men over age 69 and postmenopausal women). Infrequent or nondrinkers are advised not to begin light to moderate drinking to reduce the risk of coronary heart disease so as to avoid creating an alcohol-related problem. Similar protective effects can likely be gained through proper diet and exercise.  Women and the elderly have smaller amounts of body water  than men. As a result women and the elderly achieve a higher blood alcohol concentration after drinking the same amount of alcohol.  Exposing a fetus to alcohol can cause a broad range of birth defects referred to as Fetal Alcohol Syndrome (FAS) or Alcohol-Related Birth Defects (ARBD). Although FAS/ARBD is connected with excessive alcohol consumption during pregnancy, studies also have reported neurobehavioral problems in infants born to mothers reporting drinking an average of 1 drink per day during pregnancy.  Heavier drinking (the consumption of more than 4 drinks per occasion by men and more than 3 drinks per occasion by women) impairs learning (cognitive) and  psychomotor functions and increases the risk of alcohol-related problems, including accidents and injuries. CAGE QUESTIONS:   Have you ever felt that you should Cut down on your drinking?  Have people Annoyed you by criticizing your drinking?  Have you ever felt bad or Guilty about your drinking?  Have you ever had a drink first thing in the morning to steady your nerves or get rid of a hangover (Eye opener)? If you answered positively to any of these questions: You may be at risk for alcohol-related problems if alcohol consumption is:   Men: Greater than 14 drinks per week or more than 4 drinks per occasion.  Women: Greater than 7 drinks per week or more than 3 drinks per occasion. Do you or your family have a medical history of alcohol-related problems, such as:  Blackouts.  Sexual dysfunction.  Depression.  Trauma.  Liver dysfunction.  Sleep disorders.  Hypertension.  Chronic abdominal pain.  Has your drinking ever caused you problems, such as problems with your family, problems with your work (or school) performance, or accidents/injuries?  Do you have a compulsion to drink or a preoccupation with drinking?  Do you have poor control or are you unable to stop drinking once you have started?  Do you have to drink to avoid withdrawal symptoms?  Do you have problems with withdrawal such as tremors, nausea, sweats, or mood disturbances?  Does it take more alcohol than in the past to get you high?  Do you  feel a strong urge to drink?  Do you change your plans so that you can have a drink?  Do you ever drink in the morning to relieve the shakes or a hangover? If you have answered a number of the previous questions positively, it may be time for you to talk to your caregivers, family, and friends and see if they think you have a problem. Alcoholism is a chemical dependency that keeps getting worse and will eventually destroy your health and relationships. Many alcoholics  end up dead, impoverished, or in prison. This is often the end result of all chemical dependency.  Do not be discouraged if you are not ready to take action immediately.  Decisions to change behavior often involve up and down desires to change and feeling like you cannot decide.  Try to think more seriously about your drinking behavior.  Think of the reasons to quit. WHERE TO GO FOR ADDITIONAL INFORMATION   The National Institute on Alcohol Abuse and Alcoholism (NIAAA)www.niaaa.nih.gov  [employer] on Alcoholism and Drug Dependence (NCADD)www.ncadd.org  American Society of Addiction Medicine (ASAM)www.https://anderson-johnson.com/ Document Released: 03/07/2005 Document Revised: 05/30/2011 Document Reviewed: 10/24/2007 [facility] Patient Information 2013 [place], [place].

## 2012-03-24 NOTE — ED Notes (Signed)
NWG:[id number]<ZH> Expected date:03/24/12<BR> Expected time:11:52 AM<BR> Means of arrival:Ambulance<BR> Comments:<BR> ETOH

## 2012-03-24 NOTE — ED Provider Notes (Addendum)
Pt has sobered up in the ED.  He is alert, awake and eating.  Stable for discharge.  Celene Kras, MD 03/24/12 1902  Pt was getting ready to be discharged.  He refused to leave.  Pt denies that he was drinking alcohol despite his level of 308.  I do not think he is being truthful.  He will be discharged.    Celene Kras, MD 03/24/12 [id number]

## 2012-03-24 NOTE — ED Notes (Signed)
Security at bedside assisting tech with changing patient.

## 2012-07-11 ENCOUNTER — Ambulatory Visit: Payer: Self-pay | Admitting: General Practice

## 2012-10-25 ENCOUNTER — Encounter (HOSPITAL_COMMUNITY): Payer: Self-pay | Admitting: Emergency Medicine

## 2012-10-25 ENCOUNTER — Emergency Department (HOSPITAL_COMMUNITY): Payer: Medicaid Other

## 2012-10-25 ENCOUNTER — Inpatient Hospital Stay (HOSPITAL_COMMUNITY)
Admission: EM | Admit: 2012-10-25 | Discharge: 2012-11-02 | DRG: 496 | Disposition: A | Discharge status: Home / Self Care | Payer: Medicaid Other | Attending: Internal Medicine | Admitting: Internal Medicine

## 2012-10-25 DIAGNOSIS — K769 Liver disease, unspecified: Secondary | ICD-10-CM

## 2012-10-25 DIAGNOSIS — F1011 Alcohol abuse, in remission: Secondary | ICD-10-CM | POA: Diagnosis present

## 2012-10-25 DIAGNOSIS — M869 Osteomyelitis, unspecified: Secondary | ICD-10-CM | POA: Diagnosis present

## 2012-10-25 DIAGNOSIS — I671 Cerebral aneurysm, nonruptured: Secondary | ICD-10-CM | POA: Diagnosis present

## 2012-10-25 DIAGNOSIS — D696 Thrombocytopenia, unspecified: Secondary | ICD-10-CM | POA: Diagnosis present

## 2012-10-25 DIAGNOSIS — E871 Hypo-osmolality and hyponatremia: Secondary | ICD-10-CM

## 2012-10-25 DIAGNOSIS — R4701 Aphasia: Secondary | ICD-10-CM

## 2012-10-25 DIAGNOSIS — Z472 Encounter for removal of internal fixation device: Secondary | ICD-10-CM

## 2012-10-25 DIAGNOSIS — IMO0002 Reserved for concepts with insufficient information to code with codable children: Secondary | ICD-10-CM | POA: Diagnosis present

## 2012-10-25 DIAGNOSIS — R569 Unspecified convulsions: Secondary | ICD-10-CM

## 2012-10-25 DIAGNOSIS — A4902 Methicillin resistant Staphylococcus aureus infection, unspecified site: Secondary | ICD-10-CM | POA: Diagnosis present

## 2012-10-25 DIAGNOSIS — T847XXD Infection and inflammatory reaction due to other internal orthopedic prosthetic devices, implants and grafts, subsequent encounter: Secondary | ICD-10-CM

## 2012-10-25 DIAGNOSIS — B1920 Unspecified viral hepatitis C without hepatic coma: Secondary | ICD-10-CM

## 2012-10-25 DIAGNOSIS — B182 Chronic viral hepatitis C: Secondary | ICD-10-CM | POA: Diagnosis present

## 2012-10-25 DIAGNOSIS — M009 Pyogenic arthritis, unspecified: Secondary | ICD-10-CM

## 2012-10-25 DIAGNOSIS — I10 Essential (primary) hypertension: Secondary | ICD-10-CM | POA: Diagnosis present

## 2012-10-25 DIAGNOSIS — F1020 Alcohol dependence, uncomplicated: Secondary | ICD-10-CM | POA: Diagnosis present

## 2012-10-25 LAB — CBC WITH DIFFERENTIAL/PLATELET
Basophils Absolute: 0 10*3/uL (ref 0.0–0.1)
Eosinophils Absolute: 0 10*3/uL (ref 0.0–0.7)
Hemoglobin: 13.3 g/dL (ref 13.0–17.0)
Lymphocytes Relative: 25 % (ref 12–46)
MCHC: 35 g/dL (ref 30.0–36.0)
Neutrophils Relative %: 64 % (ref 43–77)
RDW: 12.7 % (ref 11.5–15.5)
WBC: 7.5 10*3/uL (ref 4.0–10.5)

## 2012-10-25 LAB — POCT I-STAT, CHEM 8
Calcium, Ion: 1.17 mmol/L (ref 1.12–1.23)
Chloride: 98 mEq/L (ref 96–112)
HCT: 43 % (ref 39.0–52.0)
Potassium: 3.8 mEq/L (ref 3.5–5.1)
Sodium: 135 mEq/L (ref 135–145)

## 2012-10-25 LAB — URIC ACID: Uric Acid, Serum: 5.1 mg/dL (ref 4.0–7.8)

## 2012-10-25 LAB — GRAM STAIN: Special Requests: NORMAL

## 2012-10-25 LAB — SYNOVIAL CELL COUNT + DIFF, W/ CRYSTALS
Eosinophils-Synovial: 0 % (ref 0–1)
Monocyte-Macrophage-Synovial Fluid: 5 % — ABNORMAL LOW (ref 50–90)
Other Cells-SYN: 0

## 2012-10-25 LAB — AMMONIA: Ammonia: 59 umol/L (ref 11–60)

## 2012-10-25 MED ORDER — FOLIC ACID 5 MG/ML IJ SOLN
1.0000 mg | Freq: Every day | INTRAMUSCULAR | Status: DC
  Administered 2012-10-26: 1 mg via INTRAVENOUS
  Filled 2012-10-25 (×4): qty 0.2

## 2012-10-25 MED ORDER — HEPARIN SODIUM (PORCINE) 5000 UNIT/ML IJ SOLN
5000.0000 [IU] | Freq: Three times a day (TID) | INTRAMUSCULAR | Status: DC
  Administered 2012-10-26 – 2012-11-02 (×18): 5000 [IU] via SUBCUTANEOUS
  Filled 2012-10-25 (×27): qty 1

## 2012-10-25 MED ORDER — OXYCODONE-ACETAMINOPHEN 5-325 MG PO TABS
1.0000 | ORAL_TABLET | Freq: Once | ORAL | Status: AC
  Administered 2012-10-25: 1 via ORAL
  Filled 2012-10-25: qty 1

## 2012-10-25 MED ORDER — HYDROMORPHONE HCL PF 1 MG/ML IJ SOLN
1.0000 mg | Freq: Once | INTRAMUSCULAR | Status: AC
  Administered 2012-10-25: 1 mg via INTRAVENOUS
  Filled 2012-10-25: qty 1

## 2012-10-25 MED ORDER — SODIUM CHLORIDE 0.9 % IV SOLN
Freq: Once | INTRAVENOUS | Status: AC
  Administered 2012-10-25: 23:00:00 via INTRAVENOUS

## 2012-10-25 MED ORDER — PIPERACILLIN-TAZOBACTAM 3.375 G IVPB
3.3750 g | Freq: Once | INTRAVENOUS | Status: AC
  Administered 2012-10-25: 3.375 g via INTRAVENOUS
  Filled 2012-10-25: qty 50

## 2012-10-25 MED ORDER — TOBRAMYCIN SULFATE 80 MG/2ML IJ SOLN
40.0000 mg | Freq: Once | INTRAMUSCULAR | Status: DC
  Filled 2012-10-25: qty 1

## 2012-10-25 MED ORDER — METOPROLOL TARTRATE 100 MG PO TABS
100.0000 mg | ORAL_TABLET | Freq: Two times a day (BID) | ORAL | Status: DC
  Administered 2012-10-26 – 2012-11-02 (×16): 100 mg via ORAL
  Filled 2012-10-25 (×19): qty 1

## 2012-10-25 MED ORDER — VANCOMYCIN HCL IN DEXTROSE 1-5 GM/200ML-% IV SOLN
1000.0000 mg | Freq: Once | INTRAVENOUS | Status: AC
  Administered 2012-10-25: 1000 mg via INTRAVENOUS
  Filled 2012-10-25: qty 200

## 2012-10-25 MED ORDER — CARBAMAZEPINE 200 MG PO TABS
400.0000 mg | ORAL_TABLET | Freq: Three times a day (TID) | ORAL | Status: DC
  Administered 2012-10-26 – 2012-11-02 (×24): 400 mg via ORAL
  Filled 2012-10-25 (×26): qty 2

## 2012-10-25 MED ORDER — THIAMINE HCL 100 MG/ML IJ SOLN
100.0000 mg | Freq: Every day | INTRAMUSCULAR | Status: DC
  Administered 2012-10-26: 100 mg via INTRAVENOUS
  Filled 2012-10-25 (×2): qty 1

## 2012-10-25 MED ORDER — PANTOPRAZOLE SODIUM 40 MG PO TBEC
40.0000 mg | DELAYED_RELEASE_TABLET | Freq: Every day | ORAL | Status: DC
  Administered 2012-10-26 – 2012-11-02 (×9): 40 mg via ORAL
  Filled 2012-10-25 (×11): qty 1

## 2012-10-25 NOTE — ED Notes (Signed)
MD at bedside. Dr. [name], Ortho at bedside.

## 2012-10-25 NOTE — ED Notes (Signed)
Report to Leighton Parody, RN of [facility].

## 2012-10-25 NOTE — Consult Note (Signed)
   ORTHOPAEDIC CONSULTATION  REQUESTING PHYSICIAN: Steven Newton, MD  Chief Complaint: L elbow pain  HPI: [name] [name] is a 51 y.o. male who complains of  Acute on chronic L elbow pain. He underwent a Left Elbow ORIF while incarcerated in April. He does not know who performed this surgery. He reports that his elbow has had severe pain ever since. It has gotten worse over the last 2-3 days per his report. His elbow was aspirated in the ED and injection of Tobramycin was performed. Fluid was sent for cultures by ED.  Past Medical History  Diagnosis Date  . Seizure   . ETOHism   . Aneurysm    Past Surgical History  Procedure Laterality Date  . Cerebral aneurysm repair      At [facility]   History   Social History  . Marital Status: Single    Spouse Name: N/A    Number of Children: N/A  . Years of Education: N/A   Social History Main Topics  . Smoking status: Current Every Day Smoker    Types: Cigarettes  . Smokeless tobacco: None  . Alcohol Use: Yes     Comment: drinks everyday- beer- no liquor  . Drug Use: No  . Sexually Active:    Other Topics Concern  . None   Social History Narrative  . None   History reviewed. No pertinent family history. No Known Allergies Prior to Admission medications   Medication Sig Start Date End Date Taking? Authorizing Provider  carbamazepine (TEGRETOL) 200 MG tablet Take 400 mg by mouth 3 (three) times daily.   Yes Historical Provider, MD  HYDROcodone-acetaminophen (NORCO) 5-325 MG per tablet Take 1-2 tablets by mouth every 6 (six) hours as needed for pain. 01/14/12  Yes Jon R Knapp, MD  ibuprofen (ADVIL,MOTRIN) 800 MG tablet Take 800 mg by mouth every 8 (eight) hours as needed for pain.   Yes Historical Provider, MD  metoprolol (LOPRESSOR) 100 MG tablet Take 100 mg by mouth 2 (two) times daily.   Yes Historical Provider, MD   Dg Elbow Complete Left  10/25/2012   *RADIOLOGY REPORT*  Clinical Data: Diffuse left elbow pain  and swelling.  Surgery in April 2014.  Recent fall.  LEFT ELBOW - COMPLETE 3+ VIEW  Comparison: No prior elbow graft radiographs available for comparison  Findings: The elbow is located.  Best seen in the sagittal projection is a subacute appearing fracture of the proximal ulna, near the level of the elbow joint.  There are postoperative changes of internal fixation of the proximal ulna, extending from the olecranon to the proximal diaphysis, including a cortical plate and multiple screws. The hardware appears intact.  No definite acute fracture is identified.  There is a joint effusion.  Diffuse subcutaneous soft tissue swelling is seen about the elbow.  There are tiny calcifications post in the soft tissues posterior to the elbow joint, of uncertain chronicity.  IMPRESSION:  1.  Elbow joint effusion and diffuse soft tissue swelling posterior to the elbow. 2.  Subacute appearing fracture of the proximal ulna in this patient status post ORIF of the proximal ulna.   Original Report Authenticated By: Susan Turner, M.D.    Positive ROS: All other systems have been reviewed and were otherwise negative with the exception of those mentioned in the HPI and as above.  Physical Exam: Filed Vitals:   10/25/12 2202  BP: 151/84  Pulse: 88  Temp: 100.2 F (37.9 C)  Resp: 16     General: Alert, mod distress 2/2 pain Cardiovascular: No pedal edema Respiratory: No cyanosis, no use of accessory musculature GI: No organomegaly, abdomen is soft and non-tender Skin: No lesions in the area of chief complaint Neurologic: Sensation intact distally Psychiatric: Patient is a poor historian and reports that the seizures cause him to have some memory loss. He is oriented to self and place and time Lymphatic: No axillary or cervical lymphadenopathy  MUSCULOSKELETAL:  LUE: he has swelling and TTP about his elbow joint with min erythema. There is warmth to the touch. Incision is posterior and well healed.  Other  extremities are atraumatic with painless ROM and NVI.  Assessment: Septic L elbow vs. Infected hardware, chronicity unknown but at least 2-3 days.   Plan: Plan for operative irrigation and debridment of elbow joint and hardware NPO C/s ID for long term Abx. I would like for the plate to be in for at least 9-12 mo prior to removal as he is likely slow to heal.  Possible serial Gent irrigations post op. Sling for comfort PT for ROM Weight Bearing Status: WBAT VTE px: SCD's and No orthopedic contraindication to chemical px post op   Azalya Galyon, D, MD Cell [phone]   10/25/2012 11:40 PM     

## 2012-10-25 NOTE — ED Provider Notes (Addendum)
CSN: [id number]     Arrival date & time 10/25/12  1229 History     First MD Initiated Contact with Patient 10/25/12 1243     Chief Complaint  Patient presents with  . Arm Pain   (Consider location/radiation/quality/duration/timing/severity/associated sxs/prior Treatment) HPI [name] [name] is a 52 y.o. male who presents to ED with complaint of left elbow pain. States broke his arm about 3 months ago. Had surgically repaired. States never had a splint or cast, was in jail at that time. States since then has been doing well. States yesterday pain increased. Today unable to move left elbow, states swelling to the elbow. Denies any known injury, but states he did have several seizures in the last week. States "i get seizures all the time." Denies wanting to be seen for the seizure. States "I just want to make sure I didn't re break my elbow." Pt not taking anym eidcations for his pain. No fever, chills, numbness or weakness of the arm.   Past Medical History  Diagnosis Date  . Seizure   . ETOHism   . Aneurysm    Past Surgical History  Procedure Laterality Date  . Cerebral aneurysm repair      At [facility]   No family history on file. History  Substance Use Topics  . Smoking status: Current Every Day Smoker    Types: Cigarettes  . Smokeless tobacco: Not on file  . Alcohol Use: Yes     Comment: drinks everyday- beer- no liquor    Review of Systems  Constitutional: Negative for fever and chills.  Respiratory: Negative.   Cardiovascular: Negative.   Musculoskeletal: Positive for joint swelling and arthralgias.  Neurological: Negative for weakness and numbness.    Allergies  Review of patient's allergies indicates no known allergies.  Home Medications   Current Outpatient Rx  Name  Route  Sig  Dispense  Refill  . carbamazepine (TEGRETOL) 200 MG tablet   Oral   Take 400 mg by mouth 3 (three) times daily.         Marland Kitchen HYDROcodone-acetaminophen (NORCO) 5-325 MG per  tablet   Oral   Take 1-2 tablets by mouth every 6 (six) hours as needed for pain.   16 tablet   0   . ibuprofen (ADVIL,MOTRIN) 800 MG tablet   Oral   Take 800 mg by mouth every 8 (eight) hours as needed for pain.         . metoprolol (LOPRESSOR) 100 MG tablet   Oral   Take 100 mg by mouth 2 (two) times daily.          BP 175/97  Pulse 101  Temp(Src) 98.8 F (37.1 C) (Oral)  Resp 25  SpO2 98% Physical Exam  Nursing note and vitals reviewed. Constitutional: He appears well-developed and well-nourished. No distress.  HENT:  Head: Normocephalic.  Eyes: Conjunctivae are normal.  Cardiovascular: Normal rate, regular rhythm and normal heart sounds.   Pulmonary/Chest: Effort normal and breath sounds normal. No respiratory distress. He has no wheezes. He has no rales.  Musculoskeletal: He exhibits no edema.  Left elbow swelling noted. Old surgical incision healed. No warm to palpation of the joint. Pt unable to move left elbow joint due to pain. Distal radial pulses intact. Grip strength normal.   Neurological: He is alert.  Skin: Skin is warm and dry.    ED Course   Procedures (including critical care time)   Apiration of blood/fluid Performed by: Jaynie Crumble A Consent  obtained. Required items: required blood products, implants, devices, and special equipment available Patient identity confirmed: verbally with patient Time out: Immediately prior to procedure a "time out" was called to verify the correct patient, procedure, equipment, support staff and site/side marked as required. Preparation: Patient was prepped and draped in the usual sterile fashion. Patient tolerance: Patient tolerated the procedure well with no immediate complications.  Location of aspiration: left elbow 20cc of purulent fluid aspirated from left elbow joint. Sterile dressing applied.     Results for orders placed during the hospital encounter of 10/25/12  [facility] STAIN      Result Value  Range   Specimen Description SYNOVIAL JOINT,ELBOW     Special Requests Normal     Gram Stain       Value: WBC PRESENT, PREDOMINANTLY PMN     NO ORGANISMS SEEN     Gram Stain Report Called to,Read Back By and Verified With: [id number]     CYTOSPIN   Report Status 10/25/2012 FINAL    CBC WITH DIFFERENTIAL      Result Value Range   WBC 7.5  4.0 - 10.5 K/uL   RBC 3.85 (*) 4.22 - 5.81 MIL/uL   Hemoglobin 13.3  13.0 - 17.0 g/dL   HCT 16.1 (*) 09.6 - 04.5 %   MCV 98.7  78.0 - 100.0 fL   MCH 34.5 (*) 26.0 - 34.0 pg   MCHC 35.0  30.0 - 36.0 g/dL   RDW 40.9  81.1 - 91.4 %   Platelets 85 (*) 150 - 400 K/uL   Neutrophils Relative % 64  43 - 77 %   Lymphocytes Relative 25  12 - 46 %   Monocytes Relative 11  3 - 12 %   Eosinophils Relative 0  0 - 5 %   Basophils Relative 0  0 - 1 %   Neutro Abs 4.8  1.7 - 7.7 K/uL   Lymphs Abs 1.9  0.7 - 4.0 K/uL   Monocytes Absolute 0.8  0.1 - 1.0 K/uL   Eosinophils Absolute 0.0  0.0 - 0.7 K/uL   Basophils Absolute 0.0  0.0 - 0.1 K/uL   WBC Morphology WHITE COUNT CONFIRMED ON SMEAR     Smear Review PLATELET COUNT CONFIRMED BY SMEAR    URIC ACID      Result Value Range   Uric Acid, Serum 5.1  4.0 - 7.8 mg/dL  CELL COUNT + DIFF,  W/ CRYST-SYNVL FLD      Result Value Range   Color, Synovial AMBER (*) YELLOW   Appearance-Synovial TURBID (*) CLEAR   Crystals, Fluid NO CRYSTALS SEEN     WBC, Synovial 173831 (*) 0 - 200 /cu mm   Neutrophil, Synovial 95 (*) 0 - 25 %   Lymphocytes-Synovial Fld 0  0 - 20 %   Monocyte-Macrophage-Synovial Fluid 5 (*) 50 - 90 %   Eosinophils-Synovial 0  0 - 1 %   Other Cells-SYN 0    POCT I-STAT, CHEM 8      Result Value Range   Sodium 135  135 - 145 mEq/L   Potassium 3.8  3.5 - 5.1 mEq/L   Chloride 98  96 - 112 mEq/L   BUN 6  6 - 23 mg/dL   Creatinine, Ser 7.82  0.50 - 1.35 mg/dL   Glucose, Bld 956 (*) 70 - 99 mg/dL   Calcium, Ion 2.13  0.86 - 1.23 mmol/L   TCO2 25  0 - 100 mmol/L   Hemoglobin 14.6  13.0 - 17.0 g/dL   HCT 45.4  09.8 - 11.9 %   Dg Elbow Complete Left  10/25/2012   *RADIOLOGY REPORT*  Clinical Data: Diffuse left elbow pain and swelling.  Surgery in April 2014.  Recent fall.  LEFT ELBOW - COMPLETE 3+ VIEW  Comparison: No prior elbow graft radiographs available for comparison  Findings: The elbow is located.  Best seen in the sagittal projection is a subacute appearing fracture of the proximal ulna, near the level of the elbow joint.  There are postoperative changes of internal fixation of the proximal ulna, extending from the olecranon to the proximal diaphysis, including a cortical plate and multiple screws. The hardware appears intact.  No definite acute fracture is identified.  There is a joint effusion.  Diffuse subcutaneous soft tissue swelling is seen about the elbow.  There are tiny calcifications post in the soft tissues posterior to the elbow joint, of uncertain chronicity.  IMPRESSION:  1.  Elbow joint effusion and diffuse soft tissue swelling posterior to the elbow. 2.  Subacute appearing fracture of the proximal ulna in this patient status post ORIF of the proximal ulna.   Original Report Authenticated By: Britta Mccreedy, M.D.    Spoke with Dr. [name], asked for transfer to [facility], admission by [facility]. Injection of tobramycin intra articular 0.5mg /1ML about 10-20ML.   Tobramycin injection, left intra articular After obtaining consent, and per orders of Dr. [name], injection of 12cc given by Jaynie Crumble A. Patient instructed to remain in clinic for 20 minutes afterwards, and to report any adverse reaction to me immediately. Intra articular injection of Tobramycin 0.5mg /1ML total injected 12mL. Sterile technique followed. Pt tolerated well.      1. Septic joint of left elbow     MDM  Pt with hx of left elbow ORIF, here with increased pain and swelling in the left elbow, onset yesterday. On exam, unable to move elbow. Joint effusion noted on x-ray. Joint aspirated  with 173831 WBC from synovial fluid. Spoke with Dr. [name] with orthopedics, advised tobramycin injection intra articular.  Injection given. Also started on vancomycin and zosyn. Cultures of fluid sent. Pt does not appear septic. Transfer to [facility] to be admitted.   Filed Vitals:   10/25/12 1238 10/25/12 1759  BP: 175/97 147/81  Pulse: 101 77  Temp: 98.8 F (37.1 C) 98.5 F (36.9 C)  TempSrc: Oral Oral  Resp: 25 20  SpO2: 98% 100%     Lottie Mussel, PA-C 10/25/12 1934  Myriam Jacobson Nekeshia Lenhardt, PA-C 11/16/12 1618

## 2012-10-25 NOTE — ED Notes (Signed)
[facility] here to transport pt to 5[facility] at [facility].

## 2012-10-25 NOTE — H&P (Signed)
Hospitalist Admission History and Physical  Patient name: [name] [name] Medical record number: [id number] Date of birth: May 18, 1960 Age: 52 y.o. Gender: male  Primary Care Provider: No primary provider on file.  Chief Complaint: L elbow septic arthritis   History of Present Illness:This is a 52 y.o. year old male with significant past medical history of ETOH abuse, seizures, brain aneurysm presenting with L elbow septic joint. Pt was in jail 3 months ago in [place]. Fell on L elbow. Had multiple fractures to L elbow. Had ORIF of proximal ulna. Pt states that he has had progressive swelling, pain and redness since April when surgery occurred. Has been off of seizure meds. Had unwitnessed seizure he believes yesterday and landed on L elbow. Acute worsening of chronic elbow pain.  Came to [facility]. Noted soft tissue swelling on xray as well as fluid drained from elbow w/ >170k WBCs, predominant neutrophils, gram stain was negative. Dr. [name] was contacted about case. Recs were for pt to have intrajoint tobramycin injection by [facility] and [facility].  Pt states he only drinks 1 beer per day.  No cocaine use.   There are no active problems to display for this patient.  Past Medical History: Past Medical History  Diagnosis Date  . Seizure   . ETOHism   . Aneurysm     Past Surgical History: Past Surgical History  Procedure Laterality Date  . Cerebral aneurysm repair      At [facility]    Social History: History   Social History  . Marital Status: Single    Spouse Name: N/A    Number of Children: N/A  . Years of Education: N/A   Social History Main Topics  . Smoking status: Current Every Day Smoker    Types: Cigarettes  . Smokeless tobacco: None  . Alcohol Use: Yes     Comment: drinks everyday- beer- no liquor  . Drug Use: No  . Sexually Active:    Other Topics Concern  . None   Social History Narrative  . None    Family  History: No family history on file.  Allergies: No Known Allergies  Current Facility-Administered Medications  Medication Dose Route Frequency Provider Last Rate Last Dose  . carbamazepine (TEGRETOL) tablet 400 mg  400 mg Oral TID Doree Albee, MD      . folic acid injection 1 mg  1 mg Intravenous Daily Doree Albee, MD      . heparin injection 5,000 Units  5,000 Units Subcutaneous Q8H Doree Albee, MD      . metoprolol tartrate (LOPRESSOR) tablet 100 mg  100 mg Oral BID Doree Albee, MD      . thiamine (B-1) injection 100 mg  100 mg Intravenous Daily Doree Albee, MD      . tobramycin (NEBCIN) injection 40 mg  40 mg Intramuscular Once Tatyana A Kirichenko, PA-C       Current Outpatient Prescriptions  Medication Sig Dispense Refill  . carbamazepine (TEGRETOL) 200 MG tablet Take 400 mg by mouth 3 (three) times daily.      Marland Kitchen HYDROcodone-acetaminophen (NORCO) 5-325 MG per tablet Take 1-2 tablets by mouth every 6 (six) hours as needed for pain.  16 tablet  0  . ibuprofen (ADVIL,MOTRIN) 800 MG tablet Take 800 mg by mouth every 8 (eight) hours as needed for pain.      . metoprolol (LOPRESSOR) 100 MG tablet Take 100 mg by mouth 2 (two) times daily.  Review Of Systems: 12 point ROS negative except as noted above in HPI.  Physical Exam: Filed Vitals:   10/25/12 1759  BP: 147/81  Pulse: 77  Temp: 98.5 F (36.9 C)  Resp: 20    General: alert and cooperative HEENT: PERRLA and extra ocular movement intact Heart: S1, S2 normal, no murmur, rub or gallop, regular rate and rhythm Lungs: clear to auscultation Abdomen: abdomen is soft without significant tenderness, masses, organomegaly or guarding Extremities: L elbow swelling and TTP, decreased ROM  Skin:no rashes, no ecchymoses Neurology: normal without focal findings  Labs and Imaging: Lab Results  Component Value Date/Time   NA 135 10/25/2012  2:24 PM   K 3.8 10/25/2012  2:24 PM   CL 98 10/25/2012  2:24 PM   CO2 29 01/14/2012   4:20 PM   BUN 6 10/25/2012  2:24 PM   CREATININE 0.70 10/25/2012  2:24 PM   GLUCOSE 131* 10/25/2012  2:24 PM   Lab Results  Component Value Date   WBC 7.5 10/25/2012   HGB 14.6 10/25/2012   HCT 43.0 10/25/2012   MCV 98.7 10/25/2012   PLT 85* 10/25/2012    Dg Elbow Complete Left  10/25/2012   *RADIOLOGY REPORT*  Clinical Data: Diffuse left elbow pain and swelling.  Surgery in April 2014.  Recent fall.  LEFT ELBOW - COMPLETE 3+ VIEW  Comparison: No prior elbow graft radiographs available for comparison  Findings: The elbow is located.  Best seen in the sagittal projection is a subacute appearing fracture of the proximal ulna, near the level of the elbow joint.  There are postoperative changes of internal fixation of the proximal ulna, extending from the olecranon to the proximal diaphysis, including a cortical plate and multiple screws. The hardware appears intact.  No definite acute fracture is identified.  There is a joint effusion.  Diffuse subcutaneous soft tissue swelling is seen about the elbow.  There are tiny calcifications post in the soft tissues posterior to the elbow joint, of uncertain chronicity.  IMPRESSION:  1.  Elbow joint effusion and diffuse soft tissue swelling posterior to the elbow. 2.  Subacute appearing fracture of the proximal ulna in this patient status post ORIF of the proximal ulna.   Original Report Authenticated By: Britta Mccreedy, M.D.     Assessment and Plan: [name] [name] is a 51 y.o. year old male presenting with L elbow septic arthritis   Septic Arthritis: Per ortho. Plan for transfer to [facility] for further eval. Synovial culture obtained.   Seizure: continue tegretol. Check tegretol level. Seizure precaution.s   HTN: BP stable. Continue home meds.   ETOH abuse: CIWA protocol. Ammonia level. IV thiamine and folate.   FEN/GI: NPO.  Prophylaxis: subq heparin. PPI  Disposition: pending further evaluation.  Code Status:full code.        Doree Albee MD  Pager:  [phone]

## 2012-10-25 NOTE — ED Notes (Signed)
[facility] called to transport pt to 5W.

## 2012-10-25 NOTE — Progress Notes (Signed)
  Pt admitted to the unit. Pt is stable, alert and oriented per baseline. Oriented to room, staff, and call bell. Educated to call for any assistance. Bed in lowest position, call bell within reach- will continue to monitor. 

## 2012-10-25 NOTE — ED Notes (Signed)
Per pt, broke arm years ago-thinks he re-injured arm during seizure

## 2012-10-25 NOTE — Progress Notes (Signed)
RN spoke to [facility], MD- verbal order that patient have regular diet till midnight.

## 2012-10-26 ENCOUNTER — Encounter (HOSPITAL_COMMUNITY): Payer: Self-pay | Admitting: Certified Registered"

## 2012-10-26 ENCOUNTER — Encounter (HOSPITAL_COMMUNITY): Payer: Self-pay | Admitting: Anesthesiology

## 2012-10-26 ENCOUNTER — Inpatient Hospital Stay (HOSPITAL_COMMUNITY): Payer: Medicaid Other | Admitting: Certified Registered"

## 2012-10-26 ENCOUNTER — Encounter (HOSPITAL_COMMUNITY)
Admission: EM | Disposition: A | Discharge status: Home / Self Care | Payer: Self-pay | Source: Home / Self Care | Attending: Internal Medicine

## 2012-10-26 DIAGNOSIS — B1920 Unspecified viral hepatitis C without hepatic coma: Secondary | ICD-10-CM

## 2012-10-26 DIAGNOSIS — B182 Chronic viral hepatitis C: Secondary | ICD-10-CM | POA: Diagnosis present

## 2012-10-26 DIAGNOSIS — D696 Thrombocytopenia, unspecified: Secondary | ICD-10-CM

## 2012-10-26 DIAGNOSIS — M009 Pyogenic arthritis, unspecified: Secondary | ICD-10-CM | POA: Diagnosis present

## 2012-10-26 DIAGNOSIS — F1011 Alcohol abuse, in remission: Secondary | ICD-10-CM | POA: Diagnosis present

## 2012-10-26 DIAGNOSIS — K769 Liver disease, unspecified: Secondary | ICD-10-CM

## 2012-10-26 HISTORY — PX: I & D EXTREMITY: SHX5045

## 2012-10-26 LAB — CBC WITH DIFFERENTIAL/PLATELET
Basophils Relative: 0 % (ref 0–1)
Eosinophils Absolute: 0 10*3/uL (ref 0.0–0.7)
Eosinophils Relative: 0 % (ref 0–5)
HCT: 34.4 % — ABNORMAL LOW (ref 39.0–52.0)
Hemoglobin: 12.4 g/dL — ABNORMAL LOW (ref 13.0–17.0)
Lymphs Abs: 2.3 10*3/uL (ref 0.7–4.0)
MCH: 35.2 pg — ABNORMAL HIGH (ref 26.0–34.0)
MCHC: 36 g/dL (ref 30.0–36.0)
MCV: 97.7 fL (ref 78.0–100.0)
Monocytes Absolute: 1 10*3/uL (ref 0.1–1.0)
Monocytes Relative: 13 % — ABNORMAL HIGH (ref 3–12)
RBC: 3.52 MIL/uL — ABNORMAL LOW (ref 4.22–5.81)

## 2012-10-26 LAB — SURGICAL PCR SCREEN: MRSA, PCR: NEGATIVE

## 2012-10-26 LAB — COMPREHENSIVE METABOLIC PANEL
ALT: 51 U/L (ref 0–53)
AST: 74 U/L — ABNORMAL HIGH (ref 0–37)
Albumin: 3.1 g/dL — ABNORMAL LOW (ref 3.5–5.2)
CO2: 26 mEq/L (ref 19–32)
Calcium: 8.8 mg/dL (ref 8.4–10.5)
Creatinine, Ser: 0.59 mg/dL (ref 0.50–1.35)
GFR calc non Af Amer: >90 mL/min (ref >90)
Sodium: 133 mEq/L — ABNORMAL LOW (ref 135–145)

## 2012-10-26 SURGERY — IRRIGATION AND DEBRIDEMENT EXTREMITY
Anesthesia: General | Site: Elbow | Laterality: Left | Wound class: Dirty or Infected

## 2012-10-26 MED ORDER — TOBRAMYCIN SULFATE 80 MG/2ML IJ SOLN
Freq: Three times a day (TID) | INTRAVENOUS | Status: DC
  Administered 2012-10-26 – 2012-10-29 (×8)
  Filled 2012-10-26 (×11): qty 80

## 2012-10-26 MED ORDER — LACTATED RINGERS IV SOLN
INTRAVENOUS | Status: DC | PRN
  Administered 2012-10-26: 14:00:00 via INTRAVENOUS

## 2012-10-26 MED ORDER — CEFAZOLIN SODIUM 1-5 GM-% IV SOLN
INTRAVENOUS | Status: DC | PRN
  Administered 2012-10-26: 2 g via INTRAVENOUS

## 2012-10-26 MED ORDER — FENTANYL CITRATE 0.05 MG/ML IJ SOLN
INTRAMUSCULAR | Status: DC | PRN
  Administered 2012-10-26 (×3): 50 ug via INTRAVENOUS

## 2012-10-26 MED ORDER — TOBRAMYCIN SULFATE 80 MG/2ML IJ SOLN
Freq: Three times a day (TID) | INTRAVENOUS | Status: DC
  Filled 2012-10-26 (×2): qty 80

## 2012-10-26 MED ORDER — PHENYLEPHRINE HCL 10 MG/ML IJ SOLN
INTRAMUSCULAR | Status: DC | PRN
  Administered 2012-10-26 (×3): 80 ug via INTRAVENOUS
  Administered 2012-10-26: 40 ug via INTRAVENOUS
  Administered 2012-10-26 (×2): 80 ug via INTRAVENOUS
  Administered 2012-10-26: 120 ug via INTRAVENOUS
  Administered 2012-10-26 (×3): 80 ug via INTRAVENOUS

## 2012-10-26 MED ORDER — ONDANSETRON HCL 4 MG/2ML IJ SOLN
INTRAMUSCULAR | Status: DC | PRN
  Administered 2012-10-26: 4 mg via INTRAVENOUS

## 2012-10-26 MED ORDER — SUCCINYLCHOLINE CHLORIDE 20 MG/ML IJ SOLN
INTRAMUSCULAR | Status: DC | PRN
  Administered 2012-10-26: 100 mg via INTRAVENOUS

## 2012-10-26 MED ORDER — FENTANYL CITRATE 0.05 MG/ML IJ SOLN: 50.0000 ug | Freq: Once | INTRAMUSCULAR | Status: DC

## 2012-10-26 MED ORDER — OXYCODONE HCL 5 MG PO TABS: 5.0000 mg | ORAL_TABLET | Freq: Once | ORAL | Status: DC | PRN

## 2012-10-26 MED ORDER — OXYCODONE HCL 5 MG PO TABS
5.0000 mg | ORAL_TABLET | ORAL | Status: DC | PRN
  Administered 2012-10-27 – 2012-10-31 (×12): 5 mg via ORAL
  Filled 2012-10-26 (×12): qty 1

## 2012-10-26 MED ORDER — SODIUM CHLORIDE 0.9 % IV SOLN
INTRAVENOUS | Status: DC
  Filled 2012-10-26: qty 80

## 2012-10-26 MED ORDER — PROPOFOL 10 MG/ML IV BOLUS
INTRAVENOUS | Status: DC | PRN
  Administered 2012-10-26: 200 mg via INTRAVENOUS

## 2012-10-26 MED ORDER — MIDAZOLAM HCL 2 MG/2ML IJ SOLN: 1.0000 mg | INTRAMUSCULAR | Status: DC | PRN

## 2012-10-26 MED ORDER — TOBRAMYCIN SULFATE 80 MG/2ML IJ SOLN
INTRAMUSCULAR | Status: DC | PRN
  Administered 2012-10-26: 40 mg

## 2012-10-26 MED ORDER — HYDROMORPHONE HCL PF 1 MG/ML IJ SOLN: 0.2500 mg | INTRAMUSCULAR | Status: DC | PRN

## 2012-10-26 MED ORDER — CEFAZOLIN SODIUM 1-5 GM-% IV SOLN
INTRAVENOUS | Status: AC
  Filled 2012-10-26: qty 100

## 2012-10-26 MED ORDER — MORPHINE SULFATE 2 MG/ML IJ SOLN
1.0000 mg | INTRAMUSCULAR | Status: DC | PRN
  Administered 2012-10-26 – 2012-11-01 (×18): 2 mg via INTRAVENOUS
  Filled 2012-10-26 (×13): qty 1
  Filled 2012-10-26: qty 2
  Filled 2012-10-26 (×7): qty 1

## 2012-10-26 MED ORDER — OXYCODONE-ACETAMINOPHEN 5-325 MG PO TABS
1.0000 | ORAL_TABLET | ORAL | Status: DC | PRN
  Administered 2012-10-26: 1 via ORAL
  Filled 2012-10-26: qty 1

## 2012-10-26 MED ORDER — TOBRAMYCIN SULFATE 80 MG/2ML IJ SOLN
40.0000 mg | Freq: Three times a day (TID) | INTRAMUSCULAR | Status: DC
  Filled 2012-10-26 (×2): qty 1

## 2012-10-26 MED ORDER — OXYCODONE HCL 5 MG/5ML PO SOLN: 5.0000 mg | Freq: Once | ORAL | Status: DC | PRN

## 2012-10-26 MED ORDER — MIDAZOLAM HCL 5 MG/5ML IJ SOLN
INTRAMUSCULAR | Status: DC | PRN
  Administered 2012-10-26: 2 mg via INTRAVENOUS

## 2012-10-26 MED ORDER — SODIUM CHLORIDE 0.9 % IR SOLN
Status: DC | PRN
  Administered 2012-10-26: 2000 mL

## 2012-10-26 MED ORDER — PROMETHAZINE HCL 25 MG/ML IJ SOLN
6.2500 mg | INTRAMUSCULAR | Status: DC | PRN
  Filled 2012-10-26: qty 1

## 2012-10-26 MED ORDER — LIDOCAINE HCL (CARDIAC) 20 MG/ML IV SOLN
INTRAVENOUS | Status: DC | PRN
  Administered 2012-10-26: 100 mg via INTRAVENOUS

## 2012-10-26 SURGICAL SUPPLY — 51 items
BANDAGE ELASTIC 4 VELCRO ST LF (GAUZE/BANDAGES/DRESSINGS) ×2 IMPLANT
BANDAGE ELASTIC 6 VELCRO ST LF (GAUZE/BANDAGES/DRESSINGS) ×2 IMPLANT
BANDAGE GAUZE ELAST BULKY 4 IN (GAUZE/BANDAGES/DRESSINGS) IMPLANT
BENZOIN TINCTURE PRP APPL 2/3 (GAUZE/BANDAGES/DRESSINGS) ×2 IMPLANT
BLADE SURG 10 STRL SS (BLADE) ×4 IMPLANT
BNDG COHESIVE 4X5 TAN STRL (GAUZE/BANDAGES/DRESSINGS) ×2 IMPLANT
BOOTCOVER CLEANROOM LRG (PROTECTIVE WEAR) IMPLANT
CLOTH BEACON ORANGE TIMEOUT ST (SAFETY) ×2 IMPLANT
COVER SURGICAL LIGHT HANDLE (MISCELLANEOUS) ×2 IMPLANT
CUFF TOURNIQUET SINGLE 34IN LL (TOURNIQUET CUFF) IMPLANT
DRAPE ORTHO SPLIT 77X108 STRL (DRAPES) ×2
DRAPE SURG ORHT 6 SPLT 77X108 (DRAPES) ×2 IMPLANT
DRSG TEGADERM 4X4.75 (GAUZE/BANDAGES/DRESSINGS) ×2 IMPLANT
DRSG VAC ATS SM SENSATRAC (GAUZE/BANDAGES/DRESSINGS) ×4 IMPLANT
DURAPREP 26ML APPLICATOR (WOUND CARE) ×2 IMPLANT
ELECT CAUTERY BLADE 6.4 (BLADE) ×2 IMPLANT
ELECT REM PT RETURN 9FT ADLT (ELECTROSURGICAL)
ELECTRODE REM PT RTRN 9FT ADLT (ELECTROSURGICAL) IMPLANT
EVACUATOR 1/8 PVC DRAIN (DRAIN) IMPLANT
FACESHIELD LNG OPTICON STERILE (SAFETY) IMPLANT
GAUZE XEROFORM 1X8 LF (GAUZE/BANDAGES/DRESSINGS) ×2 IMPLANT
GLOVE BIO SURGEON STRL SZ7.5 (GLOVE) ×2 IMPLANT
GLOVE BIOGEL PI IND STRL 6.5 (GLOVE) ×2 IMPLANT
GLOVE BIOGEL PI IND STRL 8 (GLOVE) ×1 IMPLANT
GLOVE BIOGEL PI INDICATOR 6.5 (GLOVE) ×2
GLOVE BIOGEL PI INDICATOR 8 (GLOVE) ×1
GLOVE ECLIPSE 6.5 STRL STRAW (GLOVE) ×2 IMPLANT
GLOVE ORTHO TXT STRL SZ7.5 (GLOVE) IMPLANT
GOWN STRL NON-REIN LRG LVL3 (GOWN DISPOSABLE) ×4 IMPLANT
HANDPIECE INTERPULSE COAX TIP (DISPOSABLE)
KIT BASIN OR (CUSTOM PROCEDURE TRAY) ×2 IMPLANT
KIT ROOM TURNOVER OR (KITS) ×2 IMPLANT
MANIFOLD NEPTUNE II (INSTRUMENTS) IMPLANT
NS IRRIG 1000ML POUR BTL (IV SOLUTION) ×2 IMPLANT
PACK ORTHO EXTREMITY (CUSTOM PROCEDURE TRAY) ×2 IMPLANT
PAD ARMBOARD 7.5X6 YLW CONV (MISCELLANEOUS) ×4 IMPLANT
PENCIL BUTTON HOLSTER BLD 10FT (ELECTRODE) IMPLANT
SET HNDPC FAN SPRY TIP SCT (DISPOSABLE) IMPLANT
SPONGE GAUZE 4X4 12PLY (GAUZE/BANDAGES/DRESSINGS) IMPLANT
SPONGE LAP 18X18 X RAY DECT (DISPOSABLE) ×2 IMPLANT
STOCKINETTE IMPERVIOUS 9X36 MD (GAUZE/BANDAGES/DRESSINGS) ×2 IMPLANT
STOPCOCK 4 WAY LG BORE MALE ST (IV SETS) ×4 IMPLANT
SUT ETHILON 3 0 PS 1 (SUTURE) ×2 IMPLANT
SUT PDS AB 2-0 CT1 27 (SUTURE) ×4 IMPLANT
TOWEL OR 17X24 6PK STRL BLUE (TOWEL DISPOSABLE) ×2 IMPLANT
TOWEL OR 17X26 10 PK STRL BLUE (TOWEL DISPOSABLE) ×2 IMPLANT
TUBE ANAEROBIC SPECIMEN COL (MISCELLANEOUS) IMPLANT
TUBE CONNECTING 12X1/4 (SUCTIONS) ×2 IMPLANT
UNDERPAD 30X30 INCONTINENT (UNDERPADS AND DIAPERS) ×2 IMPLANT
WATER STERILE IRR 1000ML POUR (IV SOLUTION) ×2 IMPLANT
YANKAUER SUCT BULB TIP NO VENT (SUCTIONS) ×2 IMPLANT

## 2012-10-26 NOTE — Progress Notes (Signed)
RN spoke to pharmacist about administering antibiotic into elbow. Rn also called Dr. [name] about how to mix drug and how to administer. Awaiting on AC to come up to witness RN giving med properly.

## 2012-10-26 NOTE — OR Nursing (Signed)
Dr. Margarita Rana placed irrigation catheter (29F) in left elbow joint for antibiotic irrigation and capped off.

## 2012-10-26 NOTE — Transfer of Care (Signed)
Immediate Anesthesia Transfer of Care Note  Patient: [name] [name]  Procedure(s) Performed: Procedure(s): IRRIGATION AND DEBRIDEMENT Left Elbow (Left)  Patient Location: PACU  Anesthesia Type:General  Level of Consciousness: awake, alert  and oriented  Airway & Oxygen Therapy: Patient Spontanous Breathing and Patient connected to face mask oxygen  Post-op Assessment: Report given to PACU RN and Post -op Vital signs reviewed and stable  Post vital signs: Reviewed and stable  Complications: No apparent anesthesia complications

## 2012-10-26 NOTE — Preoperative (Signed)
Beta Blockers   Reason not to administer Beta Blockers:Not Applicable 

## 2012-10-26 NOTE — H&P (View-Only) (Signed)
ORTHOPAEDIC CONSULTATION  REQUESTING PHYSICIAN: Doree Albee, MD  Chief Complaint: L elbow pain  HPI: [name] [name] is a 52 y.o. male who complains of  Acute on chronic L elbow pain. He underwent a Left Elbow ORIF while incarcerated in April. He does not know who performed this surgery. He reports that his elbow has had severe pain ever since. It has gotten worse over the last 2-3 days per his report. His elbow was aspirated in the ED and injection of Tobramycin was performed. Fluid was sent for cultures by ED.  Past Medical History  Diagnosis Date  . Seizure   . ETOHism   . Aneurysm    Past Surgical History  Procedure Laterality Date  . Cerebral aneurysm repair      At [facility]   History   Social History  . Marital Status: Single    Spouse Name: N/A    Number of Children: N/A  . Years of Education: N/A   Social History Main Topics  . Smoking status: Current Every Day Smoker    Types: Cigarettes  . Smokeless tobacco: None  . Alcohol Use: Yes     Comment: drinks everyday- beer- no liquor  . Drug Use: No  . Sexually Active:    Other Topics Concern  . None   Social History Narrative  . None   History reviewed. No pertinent family history. No Known Allergies Prior to Admission medications   Medication Sig Start Date End Date Taking? Authorizing Provider  carbamazepine (TEGRETOL) 200 MG tablet Take 400 mg by mouth 3 (three) times daily.   Yes Historical Provider, MD  HYDROcodone-acetaminophen (NORCO) 5-325 MG per tablet Take 1-2 tablets by mouth every 6 (six) hours as needed for pain. 01/14/12  Yes Celene Kras, MD  ibuprofen (ADVIL,MOTRIN) 800 MG tablet Take 800 mg by mouth every 8 (eight) hours as needed for pain.   Yes Historical Provider, MD  metoprolol (LOPRESSOR) 100 MG tablet Take 100 mg by mouth 2 (two) times daily.   Yes Historical Provider, MD   Dg Elbow Complete Left  10/25/2012   *RADIOLOGY REPORT*  Clinical Data: Diffuse left elbow pain  and swelling.  Surgery in April 2014.  Recent fall.  LEFT ELBOW - COMPLETE 3+ VIEW  Comparison: No prior elbow graft radiographs available for comparison  Findings: The elbow is located.  Best seen in the sagittal projection is a subacute appearing fracture of the proximal ulna, near the level of the elbow joint.  There are postoperative changes of internal fixation of the proximal ulna, extending from the olecranon to the proximal diaphysis, including a cortical plate and multiple screws. The hardware appears intact.  No definite acute fracture is identified.  There is a joint effusion.  Diffuse subcutaneous soft tissue swelling is seen about the elbow.  There are tiny calcifications post in the soft tissues posterior to the elbow joint, of uncertain chronicity.  IMPRESSION:  1.  Elbow joint effusion and diffuse soft tissue swelling posterior to the elbow. 2.  Subacute appearing fracture of the proximal ulna in this patient status post ORIF of the proximal ulna.   Original Report Authenticated By: Britta Mccreedy, M.D.    Positive ROS: All other systems have been reviewed and were otherwise negative with the exception of those mentioned in the HPI and as above.  Physical Exam: Filed Vitals:   10/25/12 2202  BP: 151/84  Pulse: 88  Temp: 100.2 F (37.9 C)  Resp: 16  General: Alert, mod distress 2/2 pain Cardiovascular: No pedal edema Respiratory: No cyanosis, no use of accessory musculature GI: No organomegaly, abdomen is soft and non-tender Skin: No lesions in the area of chief complaint Neurologic: Sensation intact distally Psychiatric: Patient is a poor historian and reports that the seizures cause him to have some memory loss. He is oriented to self and place and time Lymphatic: No axillary or cervical lymphadenopathy  MUSCULOSKELETAL:  LUE: he has swelling and TTP about his elbow joint with min erythema. There is warmth to the touch. Incision is posterior and well healed.  Other  extremities are atraumatic with painless ROM and NVI.  Assessment: Septic L elbow vs. Infected hardware, chronicity unknown but at least 2-3 days.   Plan: Plan for operative irrigation and debridment of elbow joint and hardware NPO C/s ID for long term Abx. I would like for the plate to be in for at least 9-12 mo prior to removal as he is likely slow to heal.  Possible serial Gent irrigations post op. Sling for comfort PT for ROM Weight Bearing Status: WBAT VTE px: SCD's and No orthopedic contraindication to chemical px post op   Margarita Rana, D, MD Cell [phone]   10/25/2012 11:40 PM

## 2012-10-26 NOTE — Care Management Note (Addendum)
    Page 1 of 2   11/02/2012     4:17:46 PM   CARE MANAGEMENT NOTE 11/02/2012  Patient:  [name] [name], [name] [name]   Account Number:  [id number]  Date Initiated:  10/26/2012  Documentation initiated by:  Letha Cape  Subjective/Objective Assessment:   dx septic joint of left elbow  admit- lives with son.  pta indep.     Action/Plan:   plan is for SNF.   Anticipated DC Date:  11/02/2012   Anticipated DC Plan:  SKILLED NURSING FACILITY  In-house referral  Clinical Social Worker      DC Planning Services  CM consult      Choice offered to / List presented to:             Status of service:  Completed, signed off Medicare Important Message given?   (If response is "NO", the following Medicare IM given date fields will be blank) Date Medicare IM given:   Date Additional Medicare IM given:    Discharge Disposition:  SKILLED NURSING FACILITY  Per UR Regulation:  Reviewed for med. necessity/level of care/duration of stay  If discussed at Long Length of Stay Meetings, dates discussed:   11/01/2012    Comments:  11/02/12 11:39 Letha Cape RN, BSN [phone] patient is for dc to [facility] SNF today, CSW following.  There was a referral for HHRN and HHPT, but the PA states pt will not need a HHRN or HHPT, he has medicaid in which he can not get pt unless he has a fx or cva. Patient dc to home on po abx.  11/01/12 14:49 Letha Cape RN, BSN [phone] per Maddie , financial counselor states patient's medicaid is showing active.  Informed CSW, working on SNF as plan.  10/29/12 17:13 Letha Cape RN, BSN [phone] called Boneta Lucks with [facility] to see if she can see if patient would be a candidate and also CSW will look at SNF with LOG as well.  10/26/12 10:14 Letha Cape RN, BSN [phone] patient lives with son, pta indep.  Patient has no insurance will probably need med ast and pcp.  NCM will continue to follow for dc needs.

## 2012-10-26 NOTE — Anesthesia Preprocedure Evaluation (Addendum)
Anesthesia Evaluation  Patient identified by MRN, date of birth, ID band Patient awake    Reviewed: Allergy & Precautions, H&P , NPO status , Patient's Chart, lab work & pertinent test results  Airway Mallampati: II TM Distance: >3 FB Neck ROM: Full    Dental  (+) Poor Dentition   Pulmonary  + rhonchi   Pulmonary exam normal       Cardiovascular hypertension, Pt. on medications Rhythm:Regular Rate:Normal     Neuro/Psych Seizures -, Poorly Controlled,  PSYCHIATRIC DISORDERS Cerebral aneurysm    GI/Hepatic GERD-  Poorly Controlled,(+)     substance abuse  alcohol use,   Endo/Other    Renal/GU      Musculoskeletal   Abdominal Normal abdominal exam  (+)   Peds  Hematology   Anesthesia Other Findings   Reproductive/Obstetrics                         Anesthesia Physical Anesthesia Plan  ASA: III  Anesthesia Plan: General   Post-op Pain Management:    Induction: Intravenous  Airway Management Planned: Oral ETT  Additional Equipment:   Intra-op Plan:   Post-operative Plan: Extubation in OR  Informed Consent: I have reviewed the patients History and Physical, chart, labs and discussed the procedure including the risks, benefits and alternatives for the proposed anesthesia with the patient or authorized representative who has indicated his/her understanding and acceptance.   Dental advisory given  Plan Discussed with: CRNA and Surgeon  Anesthesia Plan Comments: (Lateral position)       Anesthesia Quick Evaluation

## 2012-10-26 NOTE — Interval H&P Note (Signed)
History and Physical Interval Note:  10/26/2012 8:18 AM  [name] [name]  has presented today for surgery, with the diagnosis of Left Elbow Abscess  The various methods of treatment have been discussed with the patient and family. After consideration of risks, benefits and other options for treatment, the patient has consented to  Procedure(s): IRRIGATION AND DEBRIDEMENT Left Elbow (Left) as a surgical intervention .  The patient's history has been reviewed, patient examined, no change in status, stable for surgery.  I have reviewed the patient's chart and labs.  Questions were answered to the patient's satisfaction.     Naturi Alarid, D

## 2012-10-26 NOTE — Progress Notes (Signed)
PATIENT DETAILS Name: [name] [name] Age: 52 y.o. Sex: male Date of Birth: 1960/10/15 Admit Date: 10/25/2012 Admitting Physician Doree Albee, MD PCP:No primary provider on file.  [name] [name] is a 52 yo male with significant past medical history of alcohol abuse, seizures, and brain aneurysm who presented with left elbow septic joint.  Patient was in jail 3 months ago in [facility] where he fell on left elbow and had multiple fractures.  In April 2014 patient had ORIF of proximal ulna and has had progressive swelling, pain, and redness since surgery.  Recently, patient was off seizure medications and had an unwitnessed seizure on 8/6 when he may have hit his left elbow.  Upon admission to [facility] ER, xray of left elbow demonstrated soft tissue swelling and fluid drained from elbow contained >170k WBCs, predominant neutrophils, gram stain was negative.  Dr. [name] was contacted about case, recommended intraarticular tobramycin injection by ER and patient transferred to [facility] for OR and medical admission.  Subjective: Patient states his left elbow is still swollen and painful to touch or move.  He denies any nausea, vomiting, chills, or radiating pain.    Assessment/Plan:  Septic arthritis, Left elbow -H/o ORIF of proximal ulna -Joint aspiration with intraarticular tobramycin injection on 8/7 -Joint aspiration fluid showed >170k WBCs, primarily neutrophils, gram stain was negative. 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 carbamazepine  400 mg Oral TID  . folic acid  1 mg Intravenous Daily  . heparin  5,000 Units Subcutaneous Q8H  . metoprolol  100 mg Oral BID  . pantoprazole  40 mg Oral Daily  . thiamine  100 mg Intravenous Daily  . tobramycin  40 mg Intramuscular Once   Continuous Infusions:  PRN Meds:.oxyCODONE-acetaminophen  Antibiotics: Anti-infectives   Start     Dose/Rate Route Frequency Ordered Stop   10/25/12 2000  vancomycin (VANCOCIN) IVPB 1000 mg/200 mL premix     1,000 mg 200 mL/hr over 60 Minutes Intravenous  Once 10/25/12 1853 10/25/12 2312   10/25/12 1930  piperacillin-tazobactam (ZOSYN) IVPB 3.375 g     3.375 g 100 mL/hr over 30 Minutes Intravenous  Once 10/25/12 1853 10/25/12 2114   10/25/12 1800  tobramycin (NEBCIN) injection 40 mg     40 mg Intramuscular  Once 10/25/12 1758         PHYSICAL EXAM: Vital signs in last 24 hours: Filed Vitals:   10/25/12 2001 10/25/12 2202 10/26/12 0050 10/26/12 0503  BP: 146/84 151/84 144/74 133/72  Pulse: 84 88 93 77  Temp: 100.3 F (37.9 C) 100.2 F (37.9 C)  99.1 F (37.3 C)  TempSrc: Oral Oral  Oral  Resp: 16 16  18   Height:  6\' 6"  (1.981 m)    Weight:  81.9 kg (180 lb 8.9 oz)    SpO2: 100% 97%  96%    Weight change:  [employer]  10/25/12 2202  Weight: 81.9 kg (180 lb 8.9 oz)   Body mass index is 20.87 kg/(m^2).   Gen Exam: Awake and alert with clear speech.   Neck: Supple, No JVD.   Chest: B/L Clear.   CVS: S1 S2 Regular, no murmurs.  Abdomen: soft, BS +, non tender, non distended.  Extremities: No edema, lower extremities warm to touch.  Left elbow shows well-healed scar from ORIF.  Posterior left elbow swelling, very warm to touch, and painful to palpation with  decreased ROM.   Neurologic: Non Focal.   Skin: No Rash.      Intake/Output from previous day:  Intake/Output Summary (Last 24 hours) at 10/26/12 0945 Last data filed at 10/26/12 0849  Gross per 24 hour  Intake      0 ml  Output    550 ml  Net   -550 ml     LAB RESULTS: CBC  Recent Labs Lab 10/25/12 1416 10/25/12 1424 10/26/12 0454  WBC 7.5  --  7.7  HGB 13.3 14.6 12.4*  HCT 38.0* 43.0 34.4*  PLT 85*  --  64*  MCV 98.7  --  97.7  MCH 34.5*  --  35.2*  MCHC 35.0  --  36.0  RDW 12.7  --  12.7  LYMPHSABS 1.9  --  2.3  MONOABS 0.8  --  1.0  EOSABS 0.0  --  0.0  BASOSABS 0.0  --  0.0    Chemistries   Recent Labs Lab 10/25/12 1424 10/26/12 0454  NA 135 133*  K 3.8 3.7  CL 98 98  CO2  --  26  GLUCOSE 131* 127*  BUN 6 7  CREATININE 0.70 0.59  CALCIUM  --  8.8     GFR Estimated Creatinine Clearance: 123.7 ml/min (by C-G formula based on Cr of 0.59).  Coagulation profile No results found for this basename: INR, PROTIME,  in the last 168 hours   MICROBIOLOGY: Recent Results (from the past 240 hour(s))  BODY FLUID CULTURE     Status: None   Collection Time    10/25/12  2:04 PM      Result Value Range Status   Specimen Description SYNOVIAL JOINT,ELBOW   Final   Special Requests Normal   Final   Gram Stain     Final   Value: ABUNDANT WBC PRESENT, PREDOMINANTLY PMN     NO ORGANISMS SEEN     Gram Stain Report Called to,Read Back By and Verified With: Gram Stain Report Called to,Read Back By and Verified With: S WEST 1650 10/25/12 BY MURPHYD Performed by [facility]     Performed at [facility]   Culture PENDING   Incomplete   Report Status PENDING   Incomplete  GRAM STAIN     Status: None   Collection Time    10/25/12  2:04 PM      Result Value Range Status   Specimen Description SYNOVIAL JOINT,ELBOW   Final   Special Requests Normal   Final   Gram Stain     Final   Value: WBC PRESENT, PREDOMINANTLY PMN     NO ORGANISMS SEEN      Gram Stain Report Called to,Read Back By and Verified With: S.[email or web address]/[id number]/[email or web address]     CYTOSPIN   Report Status 10/25/2012 FINAL   Final  SURGICAL PCR SCREEN     Status: None   Collection Time    10/26/12  7:22 AM      Result Value Range  Status   MRSA, PCR NEGATIVE  NEGATIVE Final   Staphylococcus aureus NEGATIVE  NEGATIVE Final   Comment:            The Xpert SA Assay (FDA     approved for NASAL specimens     in patients over 23 years of age),     is one component of     a comprehensive surveillance     program.  Test performance has     been validated by [employer] for patients greater     than or equal to 59 year old.     It is not intended     to diagnose infection nor to     guide or monitor treatment.    RADIOLOGY STUDIES/RESULTS: Dg Elbow Complete Left  10/25/2012   *RADIOLOGY REPORT*  Clinical Data: Diffuse left elbow pain and swelling.  Surgery in April 2014.  Recent fall.  LEFT ELBOW - COMPLETE 3+ VIEW  Comparison: No prior elbow graft radiographs available for comparison  Findings: The elbow is located.  Best seen in the sagittal projection is a subacute appearing fracture of the proximal ulna, near the level of the elbow joint.  There are postoperative changes of internal fixation of the proximal ulna, extending from the olecranon to the proximal diaphysis, including a cortical plate and multiple screws. The hardware appears intact.  No definite acute fracture is identified.  There is a joint effusion.  Diffuse subcutaneous soft tissue swelling is seen about the elbow.  There are tiny calcifications post in the soft tissues posterior to the elbow joint, of uncertain chronicity.  IMPRESSION:  1.  Elbow joint effusion and diffuse soft tissue swelling posterior to the elbow. 2.  Subacute appearing fracture of the proximal ulna in this patient status post ORIF of the proximal ulna.   Original Report Authenticated By: Britta Mccreedy, M.D.    Michae Kava, PA-S  [facility]   If 7PM-7AM, please contact night-coverage [email or web address] Password [email or web address] 10/26/2012, 9:45 AM   LOS: 1 day

## 2012-10-26 NOTE — Op Note (Signed)
10/25/2012 - 10/26/2012  9:44 PM  PATIENT:  [name] [name]    PRE-OPERATIVE DIAGNOSIS:  Left Elbow Abscess  POST-OPERATIVE DIAGNOSIS:  Same  PROCEDURE:  IRRIGATION AND DEBRIDEMENT Left Elbow  SURGEON:  Quatavious Rossa, D, MD  ASSISTANT: none  ANESTHESIA:  Gen.  PREOPERATIVE INDICATIONS:  [name] [name] is a  52 y.o. male with a diagnosis of Left Elbow Abscess who failed conservative measures and elected for surgical management.    The risks benefits and alternatives were discussed with the patient preoperatively including but not limited to the risks of infection, bleeding, nerve injury, cardiopulmonary complications, the need for revision surgery, among others, and the patient was willing to proceed.  OPERATIVE IMPLANTS:  None  OPERATIVE FINDINGS: Significant purulence noted about the ulnar hardware as well as in the elbow joint.  OPERATIVE PROCEDURE:  Patient was identified in the preoperative holding area and site was marked by me He was transported to the operating theater and placed on the table in supine position taking care to pad all bony prominences. After a preincinduction time out anesthesia was induced. The left upper extremity was prepped and draped in normal sterile fashion and a pre-incision timeout was performed he received Ancef for preoperative antibiotics. I created an 8 cm incision through the he'll previous incision for his posterior ulnar plate. I dissected sharply down to the ulna immediately noticed collections of purulence about the plate itself as well as lateral to the plate. This was expressed and thoroughly irrigated. I expose the entire plate and found no other pockets of purulence. I then developed a soft tissue plane laterally around the ulna and incised the capsule of the joint longitudinally roughly around the radiocapitellar joint. Immediately significant purulent fluid was expressed from this joint. I express much is possible that extended by  arthrotomy. Irrigated with 500 cc of solution. I then percutaneously placed a arterial line catheter from the superior lateral portal into the joint. It flush the other 500 cc of saline through the joint. This was left sewn in place for further gentamicin irrigation. Once I was happy with the irrigation of the joint I selected a small wound VAC to be placed in the wound. I did place some stay sutures to prevent retraction of the skin. I placed a wound VAC and good seal was had. Then through the A-line tubing I injected 40 mg of gentamicin diluted to 1/2 mg per cc. He was then awoken and taken the PACU in stable condition to  POST OPERATIVE PLAN: Continue gentamicin irrigation of the joint with 40 mg diluted to 1/2 mg per cc every 8 hours. He'll stay in a sling. I will continue wound VAC on the posterior wound. I will discuss this care plan and possible transfer to academic Center for definitive management. I will consult the infectious disease team for possible prolonged IV antibiotics.

## 2012-10-27 DIAGNOSIS — K7460 Unspecified cirrhosis of liver: Secondary | ICD-10-CM

## 2012-10-27 DIAGNOSIS — F1011 Alcohol abuse, in remission: Secondary | ICD-10-CM

## 2012-10-27 DIAGNOSIS — M869 Osteomyelitis, unspecified: Secondary | ICD-10-CM

## 2012-10-27 LAB — CBC WITH DIFFERENTIAL/PLATELET
Eosinophils Relative: 0 % (ref 0–5)
Lymphocytes Relative: 39 % (ref 12–46)
Lymphs Abs: 2.8 10*3/uL (ref 0.7–4.0)
MCV: 99.7 fL (ref 78.0–100.0)
Neutro Abs: 3.4 10*3/uL (ref 1.7–7.7)
Platelets: 61 10*3/uL — ABNORMAL LOW (ref 150–400)
RBC: 2.98 MIL/uL — ABNORMAL LOW (ref 4.22–5.81)
WBC: 7.1 10*3/uL (ref 4.0–10.5)

## 2012-10-27 LAB — COMPREHENSIVE METABOLIC PANEL
ALT: 33 U/L (ref 0–53)
AST: 47 U/L — ABNORMAL HIGH (ref 0–37)
Albumin: 2.6 g/dL — ABNORMAL LOW (ref 3.5–5.2)
Alkaline Phosphatase: 95 U/L (ref 39–117)
Chloride: 99 mEq/L (ref 96–112)
Potassium: 3.5 mEq/L (ref 3.5–5.1)
Sodium: 133 mEq/L — ABNORMAL LOW (ref 135–145)
Total Bilirubin: 0.6 mg/dL (ref 0.3–1.2)
Total Protein: 6.4 g/dL (ref 6.0–8.3)

## 2012-10-27 LAB — PROTIME-INR
INR: 0.99 (ref 0.00–1.49)
Prothrombin Time: 12.9 seconds (ref 11.6–15.2)

## 2012-10-27 MED ORDER — VITAMIN B-1 100 MG PO TABS
100.0000 mg | ORAL_TABLET | Freq: Every day | ORAL | Status: DC
  Administered 2012-10-27 – 2012-11-02 (×7): 100 mg via ORAL
  Filled 2012-10-27 (×8): qty 1

## 2012-10-27 MED ORDER — VANCOMYCIN HCL IN DEXTROSE 1-5 GM/200ML-% IV SOLN
1000.0000 mg | Freq: Three times a day (TID) | INTRAVENOUS | Status: AC
  Administered 2012-10-27 – 2012-10-29 (×8): 1000 mg via INTRAVENOUS
  Filled 2012-10-27 (×9): qty 200

## 2012-10-27 MED ORDER — FOLIC ACID 1 MG PO TABS
1.0000 mg | ORAL_TABLET | Freq: Every day | ORAL | Status: DC
  Administered 2012-10-27 – 2012-11-02 (×7): 1 mg via ORAL
  Filled 2012-10-27 (×8): qty 1

## 2012-10-27 NOTE — Progress Notes (Signed)
PATIENT DETAILS Name: [name] [name] Age: 52 y.o. Sex: male Date of Birth: 03/14/1961 Admit Date: 10/25/2012 Admitting Physician Doree Albee, MD PCP:No primary provider on file.  [name] [name] is a 52 yo male with significant past medical history of alcohol abuse, seizures, and brain aneurysm who presented with left elbow septic joint.  Patient was in jail 3 months ago in [facility] where he fell on left elbow and had multiple fractures.  In April 2014 patient had ORIF of proximal ulna and has had progressive swelling, pain, and redness since surgery.  Recently, patient was off seizure medications and had an unwitnessed seizure on 8/6 when he may have hit his left elbow.  Upon admission to [facility] ER, xray of left elbow demonstrated soft tissue swelling and fluid drained from elbow contained >170k WBCs, predominant neutrophils, gram stain was negative.  Dr. [name] was contacted about case, recommended intraarticular tobramycin injection by ER and patient transferred to [facility] for OR and medical admission.  Subjective: Status post debridement, still has low-grade fever. Recommendation to consult ID noted.  Assessment/Plan:  Septic arthritis, Left elbow -H/o ORIF of proximal ulna -Joint aspiration with intraarticular tobramycin injection on 8/7 -Joint aspiration fluid showed >170k WBCs, primarily neutrophils, gram stain was negative. -On IV vancomycin, tobramycin for joint space irrigation.  -The internal fixation hardware was not removed.  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 carbamazepine  400 mg Oral TID  . folic acid  1 mg Oral Daily  . heparin  5,000 Units Subcutaneous Q8H  . metoprolol  100 mg Oral BID  . pantoprazole  40 mg Oral Daily  . thiamine  100 mg Oral Daily  . Tobramycin 40 mg in 0.9% NS 80 mL (0.5 mg/mL concentration)   Irrigation Q8H  . vancomycin  1,000 mg Intravenous Q8H   Continuous Infusions:  PRN Meds:.morphine injection, oxyCODONE  Antibiotics: Anti-infectives   Start     Dose/Rate Route Frequency Ordered Stop   10/27/12 1300  vancomycin (VANCOCIN) IVPB 1000 mg/200 mL premix     1,000 mg 200 mL/hr over 60 Minutes Intravenous Every 8 hours 10/27/12 1157     10/27/12 0000  Tobramycin 40 mg in 0.9% NS 80 mL (0.5 mg/mL concentration)     20 mL/hr over 48 Hours Irrigation Every 8 hours 10/26/12 2300     10/26/12 2200  tobramycin (NEBCIN) injection 40 mg  Status:  Discontinued     40 mg Intramuscular Every 8 hours 10/26/12 1637 10/26/12 2248   10/26/12 1445  sodium chloride 0.9 % 80 mL with tobramycin (NEBCIN) 40 mg infusion  Status:  Discontinued     10-20 mL/hr  Intravenous To Surgery 10/26/12 1436 10/26/12 1651   10/26/12 1442  tobramycin (NEBCIN) injection  Status:  Discontinued       As needed 10/26/12 1451 10/26/12 1527   10/26/12 0000  Tobramycin 40 mg in 0.9% NS 80 mL (0.5 mg/mL  concentration)  Status:  Discontinued     20 mL/hr  Irrigation Every 8 hours 10/26/12 2248 10/26/12 2300   10/25/12 2000  vancomycin (VANCOCIN) IVPB 1000 mg/200 mL premix     1,000 mg 200 mL/hr over 60 Minutes Intravenous  Once 10/25/12 1853 10/25/12 2312   10/25/12 1930  piperacillin-tazobactam (ZOSYN) IVPB 3.375 g     3.375 g 100 mL/hr over 30 Minutes  Intravenous  Once 10/25/12 1853 10/25/12 2114   10/25/12 1800  tobramycin (NEBCIN) injection 40 mg  Status:  Discontinued     40 mg Intramuscular  Once 10/25/12 1758 10/26/12 1636       PHYSICAL EXAM: Vital signs in last 24 hours: Filed Vitals:   10/26/12 1617 10/26/12 1630 10/26/12 2225 10/27/12 0621  BP: 127/104  130/76 123/64  Pulse:   80 86  Temp:  98.2 F (36.8 C) 101 F (38.3 C) 99 F (37.2 C)  TempSrc:   Oral Oral  Resp: 18  18 18   Height:      Weight:      SpO2:   97% 94%    Weight change:  Filed Weights   10/25/12 2202  Weight: 81.9 kg (180 lb 8.9 oz)   Body mass index is 20.87 kg/(m^2).   Gen Exam: Awake and alert with clear speech.   Neck: Supple, No JVD.   Chest: B/L Clear.   CVS: S1 S2 Regular, no murmurs.  Abdomen: soft, BS +, non tender, non distended.  Extremities: No edema, lower extremities warm to touch.  Left elbow shows well-healed scar from ORIF.  Posterior left elbow swelling, very warm to touch, and painful to palpation with decreased ROM.   Neurologic: Non Focal.   Skin: No Rash.      Intake/Output from previous day:  Intake/Output Summary (Last 24 hours) at 10/27/12 1222 Last data filed at 10/27/12 1610  Gross per 24 hour  Intake   1920 ml  Output   1130 ml  Net    790 ml     LAB RESULTS: CBC  Recent Labs Lab 10/25/12 1416 10/25/12 1424 10/26/12 0454 10/27/12 0516  WBC 7.5  --  7.7 7.1  HGB 13.3 14.6 12.4* 10.6*  HCT 38.0* 43.0 34.4* 29.7*  PLT 85*  --  64* 61*  MCV 98.7  --  97.7 99.7  MCH 34.5*  --  35.2* 35.6*  MCHC 35.0  --  36.0 35.7  RDW 12.7  --  12.7 13.0  LYMPHSABS 1.9  --  2.3 2.8  MONOABS 0.8  --  1.0 0.9  EOSABS 0.0  --  0.0 0.0  BASOSABS 0.0  --  0.0 0.0    Chemistries   Recent Labs Lab 10/25/12 1424 10/26/12 0454 10/27/12 0516  NA 135 133* 133*  K 3.8 3.7 3.5  CL 98 98 99  CO2  --  26 25  GLUCOSE 131* 127* 114*  BUN 6 7 7   CREATININE 0.70 0.59 0.59  CALCIUM  --  8.8 8.0*      GFR Estimated Creatinine Clearance: 123.7 ml/min (by C-G formula based on Cr of 0.59).  Coagulation profile  Recent Labs Lab 10/27/12 0516  INR 0.99     MICROBIOLOGY: Recent Results (from the past 240 hour(s))  BODY FLUID CULTURE     Status: None   Collection Time    10/25/12  2:04 PM      Result Value Range Status   Specimen Description SYNOVIAL JOINT,ELBOW   Final  Special Requests Normal   Final   Gram Stain     Final   Value: ABUNDANT WBC PRESENT, PREDOMINANTLY PMN     NO ORGANISMS SEEN     Gram Stain Report Called to,Read Back By and Verified With: Gram Stain Report Called to,Read Back By and Verified With: S WEST 1650 10/25/12 BY MURPHYD Performed by [facility]     Performed at [facility]   Culture PENDING   Incomplete   Report Status PENDING   Incomplete  GRAM STAIN     Status: None   Collection Time    10/25/12  2:04 PM      Result Value Range Status   Specimen Description SYNOVIAL JOINT,ELBOW   Final   Special Requests Normal   Final   Gram Stain     Final   Value: WBC PRESENT, PREDOMINANTLY PMN     NO ORGANISMS SEEN     Gram Stain Report Called to,Read Back By and Verified With: [id number]     CYTOSPIN   Report Status 10/25/2012 FINAL   Final  SURGICAL PCR SCREEN     Status: None   Collection Time    10/26/12  7:22 AM      Result Value Range Status   MRSA, PCR NEGATIVE  NEGATIVE Final   Staphylococcus aureus NEGATIVE  NEGATIVE Final   Comment:            The Xpert SA Assay (FDA     approved for NASAL specimens     in patients over 68 years of age),     is one component of     a comprehensive surveillance     program.  Test performance has     been validated by [employer] for patients greater     than or equal to 59 year old.     It is not intended     to diagnose infection nor to     guide or monitor treatment.    RADIOLOGY STUDIES/RESULTS: Dg Elbow Complete Left  10/25/2012   *RADIOLOGY REPORT*   Clinical Data: Diffuse left elbow pain and swelling.  Surgery in April 2014.  Recent fall.  LEFT ELBOW - COMPLETE 3+ VIEW  Comparison: No prior elbow graft radiographs available for comparison  Findings: The elbow is located.  Best seen in the sagittal projection is a subacute appearing fracture of the proximal ulna, near the level of the elbow joint.  There are postoperative changes of internal fixation of the proximal ulna, extending from the olecranon to the proximal diaphysis, including a cortical plate and multiple screws. The hardware appears intact.  No definite acute fracture is identified.  There is a joint effusion.  Diffuse subcutaneous soft tissue swelling is seen about the elbow.  There are tiny calcifications post in the soft tissues posterior to the elbow joint, of uncertain chronicity.  IMPRESSION:  1.  Elbow joint effusion and diffuse soft tissue swelling posterior to the elbow. 2.  Subacute appearing fracture of the proximal ulna in this patient status post ORIF of the proximal ulna.   Original Report Authenticated By: Britta Mccreedy, M.D.    [facility]   If 7PM-7AM, please contact night-coverage [email or web address] Password [email or web address] 10/27/2012, 12:22 PM   LOS: 2 days

## 2012-10-27 NOTE — Consult Note (Addendum)
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 [name]   HPI: [name] [name] is an 53 y.o. male  With hx of Etoh abuse, hepatitis C genotype 1b seizures, cerebral aneurysm, left elbow trauma with ORIF, incarceration in [facility], and now admission for elbow pain and swelling with fevers. Joint was tapped in more than 170,000 white blood cells were found with predominant neutrophils. No organisms were seen on the Gram stain and culture still negative to date. He was placed on various antibiotics in the interim including Zosyn vancomycin cefazolin. He is currently on vancomycin alone. He was taken to the operating room Dr. [name] 10/26/2012 and had an [employer] irrigation and debridement of the left elbow performed. Her acidosis in the management work up of this patient with septic arthritis associated with hardware and osteomyelitis.  Past Medical History  Diagnosis Date  . Seizure   . ETOHism   . Aneurysm     Past Surgical History  Procedure Laterality Date  . Cerebral aneurysm repair      At [facility]  ergies:   No Known Allergies   Medications: I have reviewed patients current medications as documented in Epic Anti-infectives   Start     Dose/Rate Route Frequency Ordered Stop   10/27/12 1300  vancomycin (VANCOCIN) IVPB 1000 mg/200 mL premix     1,000 mg 200 mL/hr over 60 Minutes Intravenous Every 8 hours 10/27/12 1157     10/27/12 0000  Tobramycin 40 mg in 0.9% NS 80 mL (0.5 mg/mL concentration)     20 mL/hr over 48 Hours Irrigation Every 8 hours 10/26/12 2300     10/26/12 2200  tobramycin (NEBCIN) injection 40 mg  Status:  Discontinued     40 mg Intramuscular Every 8 hours 10/26/12 1637 10/26/12 2248   10/26/12 1445  sodium chloride 0.9 % 80 mL with tobramycin (NEBCIN) 40 mg infusion  Status:   Discontinued     10-20 mL/hr  Intravenous To Surgery 10/26/12 1436 10/26/12 1651   10/26/12 1442  tobramycin (NEBCIN) injection  Status:  Discontinued       As needed 10/26/12 1451 10/26/12 1527   10/26/12 0000  Tobramycin 40 mg in 0.9% NS 80 mL (0.5 mg/mL concentration)  Status:  Discontinued     20 mL/hr  Irrigation Every 8 hours 10/26/12 2248 10/26/12 2300   10/25/12 2000  vancomycin (VANCOCIN) IVPB 1000 mg/200 mL premix     1,000 mg 200 mL/hr over 60 Minutes Intravenous  Once 10/25/12 1853 10/25/12 2312   10/25/12 1930  piperacillin-tazobactam (ZOSYN) IVPB 3.375 g     3.375 g 100 mL/hr over 30 Minutes Intravenous  Once 10/25/12 1853 10/25/12 2114   10/25/12 1800  tobramycin (NEBCIN) injection 40 mg  Status:  Discontinued     40 mg Intramuscular  Once 10/25/12 1758 10/26/12 1636      Social History:  reports that he has been smoking Cigarettes.  He has been smoking about 0.00 packs per day. He does not have any smokeless tobacco history on file. He reports that  drinks alcohol. He reports that he does not use illicit drugs.  History reviewed. No pertinent family history. HTN in mom  As in HPI and primary teams notes otherwise 12 point review of systems is negative  Blood pressure 143/89, pulse 120, temperature 100.1 F (37.8 C),  temperature source Oral, resp. rate 18, height 6\' 6"  (1.981 m), weight 180 lb 8.9 oz (81.9 kg), SpO2 96.00%. General: Alert and awake, very hard of hearing, and question whether there is some element of aphasia. Patient told again to the conversation that he would be able to have a better conversation with me in a few days, ? May be postictal. HEENT: anicteric sclera, pupils reactive to light and accommodation, EOMI, oropharynx clear and without exudate CVS regular rate, normal r,  no murmur rubs or gallops Chest: clear to auscultation bilaterally, no wheezing, rales or rhonchi Abdomen: soft nontender, nondistended, normal bowel sounds, Extremities left  elbow in sling and bandaged  Skin: no rashes Neuro: nonfocal, strength and sensation intact,  confused  Results for orders placed during the hospital encounter of 10/25/12 (from the past 48 hour(s))  POCT I-STAT, CHEM 8     Status: Abnormal   Collection Time    10/25/12  2:24 PM      Result Value Range   Sodium 135  135 - 145 mEq/L   Potassium 3.8  3.5 - 5.1 mEq/L   Chloride 98  96 - 112 mEq/L   BUN 6  6 - 23 mg/dL   Creatinine, Ser 4.09  0.50 - 1.35 mg/dL   Glucose, Bld 811 (*) 70 - 99 mg/dL   Calcium, Ion 9.14  7.82 - 1.23 mmol/L   TCO2 25  0 - 100 mmol/L   Hemoglobin 14.6  13.0 - 17.0 g/dL   HCT 95.6  21.3 - 08.6 %  AMMONIA     Status: None   Collection Time    10/25/12  7:30 PM      Result Value Range   Ammonia 59  11 - 60 umol/L  CARBAMAZEPINE LEVEL, TOTAL     Status: Abnormal   Collection Time    10/25/12  7:30 PM      Result Value Range   Carbamazepine Lvl <0.5 (*) 4.0 - 12.0 ug/mL   Comment: Performed at [facility]  COMPREHENSIVE METABOLIC PANEL     Status: Abnormal   Collection Time    10/26/12  4:54 AM      Result Value Range   Sodium 133 (*) 135 - 145 mEq/L   Potassium 3.7  3.5 - 5.1 mEq/L   Chloride 98  96 - 112 mEq/L   CO2 26  19 - 32 mEq/L   Glucose, Bld 127 (*) 70 - 99 mg/dL   BUN 7  6 - 23 mg/dL   Creatinine, Ser 5.78  0.50 - 1.35 mg/dL   Calcium 8.8  8.4 - 46.9 mg/dL   Total Protein 7.4  6.0 - 8.3 g/dL   Albumin 3.1 (*) 3.5 - 5.2 g/dL   AST 74 (*) 0 - 37 U/L   ALT 51  0 - 53 U/L   Alkaline Phosphatase 110  39 - 117 U/L   Total Bilirubin 1.7 (*) 0.3 - 1.2 mg/dL   GFR calc non Af Amer >90  >90 mL/min   GFR calc Af Amer >90  >90 mL/min   Comment:            The eGFR has been calculated     using the CKD EPI equation.     This calculation has not been     validated in all clinical     situations.     eGFR's persistently     <90 mL/min signify     possible Chronic Kidney Disease.  CBC WITH DIFFERENTIAL     Status: Abnormal   Collection  Time    10/26/12  4:54 AM      Result Value Range   WBC 7.7  4.0 - 10.5 K/uL   RBC 3.52 (*) 4.22 - 5.81 MIL/uL   Hemoglobin 12.4 (*) 13.0 - 17.0 g/dL   HCT 16.1 (*) 09.6 - 04.5 %   MCV 97.7  78.0 - 100.0 fL   MCH 35.2 (*) 26.0 - 34.0 pg   MCHC 36.0  30.0 - 36.0 g/dL   RDW 40.9  81.1 - 91.4 %   Platelets 64 (*) 150 - 400 K/uL   Comment: PLATELET COUNT CONFIRMED BY SMEAR   Neutrophils Relative % 57  43 - 77 %   Neutro Abs 4.4  1.7 - 7.7 K/uL   Lymphocytes Relative 29  12 - 46 %   Lymphs Abs 2.3  0.7 - 4.0 K/uL   Monocytes Relative 13 (*) 3 - 12 %   Monocytes Absolute 1.0  0.1 - 1.0 K/uL   Eosinophils Relative 0  0 - 5 %   Eosinophils Absolute 0.0  0.0 - 0.7 K/uL   Basophils Relative 0  0 - 1 %   Basophils Absolute 0.0  0.0 - 0.1 K/uL  SURGICAL PCR SCREEN     Status: None   Collection Time    10/26/12  7:22 AM      Result Value Range   MRSA, PCR NEGATIVE  NEGATIVE   Staphylococcus aureus NEGATIVE  NEGATIVE   Comment:            The Xpert SA Assay (FDA     approved for NASAL specimens     in patients over 15 years of age),     is one component of     a comprehensive surveillance     program.  Test performance has     been validated by [employer] for patients greater     than or equal to 1 year old.     It is not intended     to diagnose infection nor to     guide or monitor treatment.  CBC WITH DIFFERENTIAL     Status: Abnormal   Collection Time    10/27/12  5:16 AM      Result Value Range   WBC 7.1  4.0 - 10.5 K/uL   RBC 2.98 (*) 4.22 - 5.81 MIL/uL   Hemoglobin 10.6 (*) 13.0 - 17.0 g/dL   HCT 78.2 (*) 95.6 - 21.3 %   MCV 99.7  78.0 - 100.0 fL   MCH 35.6 (*) 26.0 - 34.0 pg   MCHC 35.7  30.0 - 36.0 g/dL   RDW 08.6  57.8 - 46.9 %   Platelets 61 (*) 150 - 400 K/uL   Comment: CONSISTENT WITH PREVIOUS RESULT   Neutrophils Relative % 48  43 - 77 %   Neutro Abs 3.4  1.7 - 7.7 K/uL   Lymphocytes Relative 39  12 - 46 %   Lymphs Abs 2.8  0.7 - 4.0 K/uL   Monocytes  Relative 13 (*) 3 - 12 %   Monocytes Absolute 0.9  0.1 - 1.0 K/uL   Eosinophils Relative 0  0 - 5 %   Eosinophils Absolute 0.0  0.0 - 0.7 K/uL   Basophils Relative 0  0 - 1 %   Basophils Absolute 0.0  0.0 - 0.1 K/uL  PROTIME-INR  Status: None   Collection Time    10/27/12  5:16 AM      Result Value Range   Prothrombin Time 12.9  11.6 - 15.2 seconds   INR 0.99  0.00 - 1.49  COMPREHENSIVE METABOLIC PANEL     Status: Abnormal   Collection Time    10/27/12  5:16 AM      Result Value Range   Sodium 133 (*) 135 - 145 mEq/L   Potassium 3.5  3.5 - 5.1 mEq/L   Chloride 99  96 - 112 mEq/L   CO2 25  19 - 32 mEq/L   Glucose, Bld 114 (*) 70 - 99 mg/dL   BUN 7  6 - 23 mg/dL   Creatinine, Ser 1.19  0.50 - 1.35 mg/dL   Calcium 8.0 (*) 8.4 - 10.5 mg/dL   Total Protein 6.4  6.0 - 8.3 g/dL   Albumin 2.6 (*) 3.5 - 5.2 g/dL   AST 47 (*) 0 - 37 U/L   ALT 33  0 - 53 U/L   Alkaline Phosphatase 95  39 - 117 U/L   Total Bilirubin 0.6  0.3 - 1.2 mg/dL   GFR calc non Af Amer >90  >90 mL/min   GFR calc Af Amer >90  >90 mL/min   Comment:            The eGFR has been calculated     using the CKD EPI equation.     This calculation has not been     validated in all clinical     situations.     eGFR's persistently     <90 mL/min signify     possible Chronic Kidney Disease.      Component Value Date/Time   SDES SYNOVIAL JOINT,ELBOW 10/25/2012 1404   SDES SYNOVIAL JOINT,ELBOW 10/25/2012 1404   SPECREQUEST Normal 10/25/2012 1404   SPECREQUEST Normal 10/25/2012 1404   CULT PENDING 10/25/2012 1404   REPTSTATUS PENDING 10/25/2012 1404   REPTSTATUS 10/25/2012 FINAL 10/25/2012 1404   No results found.   Recent Results (from the past 720 hour(s))  BODY FLUID CULTURE     Status: None   Collection Time    10/25/12  2:04 PM      Result Value Range Status   Specimen Description SYNOVIAL JOINT,ELBOW   Final   Special Requests Normal   Final   Gram Stain     Final   Value: ABUNDANT WBC PRESENT, PREDOMINANTLY PMN       NO ORGANISMS SEEN     Gram Stain Report Called to,Read Back By and Verified With: Gram Stain Report Called to,Read Back By and Verified With: S WEST 1650 10/25/12 BY MURPHYD Performed by [facility]     Performed at [facility]   Culture PENDING   Incomplete   Report Status PENDING   Incomplete  GRAM STAIN     Status: None   Collection Time    10/25/12  2:04 PM      Result Value Range Status   Specimen Description SYNOVIAL JOINT,ELBOW   Final   Special Requests Normal   Final   Gram Stain     Final   Value: WBC PRESENT, PREDOMINANTLY PMN     NO ORGANISMS SEEN     Gram Stain Report Called to,Read Back By and Verified With: S.WEST/[id number]/[email or web address]     CYTOSPIN   Report Status 10/25/2012 FINAL   Final  SURGICAL PCR SCREEN     Status: None  Collection Time    10/26/12  7:22 AM      Result Value Range Status   MRSA, PCR NEGATIVE  NEGATIVE Final   Staphylococcus aureus NEGATIVE  NEGATIVE Final   Comment:            The Xpert SA Assay (FDA     approved for NASAL specimens     in patients over 69 years of age),     is one component of     a comprehensive surveillance     program.  Test performance has     been validated by [employer] for patients greater     than or equal to 60 year old.     It is not intended     to diagnose infection nor to     guide or monitor treatment.     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: --Continue vancomycin with goal trough 15-20 - considered adding rifampin but WILL NOT given his eothism, and hep c and liver disease  --I am not completely convinced he could safely go home with IV abx and PICC line, he is right handed and left arm is sp surgery and it is not clear from conversation today if he would have sufficient help (will revisit thisissue) --if transfer to [facility] is being considered I would do it during THIS admission as recently they have begun NOT seeing pts  ELECTIVELY WITHOUT insurance, medicaid, medicare or at minimum an application to the [facility] exchange program  #2 Screening; should have had hiv testing in prison but will repeat  #3 Hepatitis C genotype 1b: --WOULD RECOMMEND getting him into care with Hepatologist, Dr Keane Police group from [facility] gomes here to [facility], he would benefit from being on NEW ALL oral regimen with high cure rate, low toxicities. He is at VERY high risk for progression of his cirrhosis with untreated hep c, etohism, AA race  Thank you so much for this interesting consult  [facility] [phone] (pager) [phone] (office) 10/27/2012, 2:17 PM  Paulette Blanch Dam 10/27/2012, 2:17 PM

## 2012-10-27 NOTE — Progress Notes (Signed)
Irrigated pt's wound with the tobramycin through his port. Pt did not take medication well. He was premedicated with 2mg  of morphine. He complained the medication was too cold on his bones. Pt is now calm and resting. Will continue to monitor pt.

## 2012-10-27 NOTE — Progress Notes (Signed)
Subjective: 1 Day Post-Op Procedure(s) (LRB): IRRIGATION AND DEBRIDEMENT Left Elbow (Left) Patient reports pain as moderate.   Wound vac has come off.  Objective: Vital signs in last 24 hours: Temp:  [98.2 F (36.8 C)-101 F (38.3 C)] 99 F (37.2 C) (08/09 0621) Pulse Rate:  [75-86] 86 (08/09 0621) Resp:  [18-28] 18 (08/09 0621) BP: (123-141)/(64-104) 123/64 mmHg (08/09 0621) SpO2:  [90 %-100 %] 94 % (08/09 0621)  Intake/Output from previous day: 08/08 0701 - 08/09 0700 In: 1920 [P.O.:120; I.V.:1800] Out: 1530 [Urine:1470; Blood:60] Intake/Output this shift:     Recent Labs  10/25/12 1416 10/25/12 1424 10/26/12 0454 10/27/12 0516  HGB 13.3 14.6 12.4* 10.6*    Recent Labs  10/26/12 0454 10/27/12 0516  WBC 7.7 7.1  RBC 3.52* 2.98*  HCT 34.4* 29.7*  PLT 64* 61*    Recent Labs  10/26/12 0454 10/27/12 0516  NA 133* 133*  K 3.7 3.5  CL 98 99  CO2 26 25  BUN 7 7  CREATININE 0.59 0.59  GLUCOSE 127* 114*  CALCIUM 8.8 8.0*    Recent Labs  10/27/12 0516  INR 0.99    Neurovascular intact Sensation intact distally Intact pulses distally Compartment soft  Assessment/Plan: 1 Day Post-Op Procedure(s) (LRB): IRRIGATION AND DEBRIDEMENT Left Elbow (Left) Discussed with Dr. [name] we will try to reestablish wound vac and continue with Tobra IM through catheter No wound nurse on call for the weekend we will reorder supplies and attempt to reestablish vac, otherwise wet to dry dressings until OR on Mon. Consult ID for IV abx recs will start on IV vanc for now  Plan for repeat I&D Mon in OR Pain control as ordered  Tashon Capp 10/27/2012, 10:41 AM

## 2012-10-27 NOTE — Progress Notes (Signed)
Pt pulled out his IV after nurse d/c PRN IV fluid for pain medication. Pt was very angry and blamed nurse for touching the IV in the first place. RN explained to pt that only the tubing for the IV fluid was removed by the nurse, not the actual IV. Pt continued to say that he knows RN pulled it out. RN assessed site. Bleeding was controlled. RN will start new IV soon.

## 2012-10-27 NOTE — Progress Notes (Signed)
ANTIBIOTIC CONSULT NOTE - INITIAL  Pharmacy Consult for Vancomycin Indication: infected left elbow s/p    No Known Allergies  Patient Measurements: Height: 6\' 6"  (198.1 cm) Weight: 180 lb 8.9 oz (81.9 kg) IBW/kg (Calculated) : 91.4  Vital Signs: Temp: 99 F (37.2 C) (08/09 0621) Temp src: Oral (08/09 0621) BP: 123/64 mmHg (08/09 0621) Pulse Rate: 86 (08/09 0621) Intake/Output from previous day: 08/08 0701 - 08/09 0700 In: 1920 [P.O.:120; I.V.:1800] Out: 1530 [Urine:1470; Blood:60] Intake/Output from this shift:    Labs:  Recent Labs  10/25/12 1416 10/25/12 1424 10/26/12 0454 10/27/12 0516  WBC 7.5  --  7.7 7.1  HGB 13.3 14.6 12.4* 10.6*  PLT 85*  --  64* 61*  CREATININE  --  0.70 0.59 0.59   Estimated Creatinine Clearance: 123.7 ml/min (by C-G formula based on Cr of 0.59). No results found for this basename: VANCOTROUGH, Leodis Binet, VANCORANDOM, GENTTROUGH, GENTPEAK, GENTRANDOM, TOBRATROUGH, TOBRAPEAK, TOBRARND, AMIKACINPEAK, AMIKACINTROU, AMIKACIN,  in the last 72 hours   Microbiology: Recent Results (from the past 720 hour(s))  BODY FLUID CULTURE     Status: None   Collection Time    10/25/12  2:04 PM      Result Value Range Status   Specimen Description SYNOVIAL JOINT,ELBOW   Final   Special Requests Normal   Final   Gram Stain     Final   Value: ABUNDANT WBC PRESENT, PREDOMINANTLY PMN     NO ORGANISMS SEEN     Gram Stain Report Called to,Read Back By and Verified With: Gram Stain Report Called to,Read Back By and Verified With: S WEST 1650 10/25/12 BY MURPHYD Performed by [facility]     Performed at [facility]   Culture PENDING   Incomplete   Report Status PENDING   Incomplete  GRAM STAIN     Status: None   Collection Time    10/25/12  2:04 PM      Result Value Range Status   Specimen Description SYNOVIAL JOINT,ELBOW   Final   Special Requests Normal   Final   Gram Stain     Final   Value: WBC PRESENT, PREDOMINANTLY PMN     NO  ORGANISMS SEEN     Gram Stain Report Called to,Read Back By and Verified With: S.WEST/[id number]/MURPHYD     CYTOSPIN   Report Status 10/25/2012 FINAL   Final  SURGICAL PCR SCREEN     Status: None   Collection Time    10/26/12  7:22 AM      Result Value Range Status   MRSA, PCR NEGATIVE  NEGATIVE Final   Staphylococcus aureus NEGATIVE  NEGATIVE Final   Comment:            The Xpert SA Assay (FDA     approved for NASAL specimens     in patients over 10 years of age),     is one component of     a comprehensive surveillance     program.  Test performance has     been validated by [employer] for patients greater     than or equal to 84 year old.     It is not intended     to diagnose infection nor to     guide or monitor treatment.    Medical History: Past Medical History  Diagnosis Date  . Seizure   . ETOHism   . Aneurysm     Medications:  Scheduled:  .  carbamazepine  400 mg Oral TID  . folic acid  1 mg Oral Daily  . heparin  5,000 Units Subcutaneous Q8H  . metoprolol  100 mg Oral BID  . pantoprazole  40 mg Oral Daily  . thiamine  100 mg Oral Daily  . Tobramycin 40 mg in 0.9% NS 80 mL (0.5 mg/mL concentration)   Irrigation Q8H   Assessment: 52 y.o. male with a diagnosis of Left Elbow Abscess who failed conservative measures and elected for surgical management. Pt is s/p irrigation and debridement of Lt elbow x1day.  Will continue with Tobra irrigation with debridement, and OR on Monday.  Pharmacy consulted for IV Vanc dosing.  Goal of Therapy:  Vancomycin trough level 15-20 mcg/ml  Plan:  -Start Vanc 1000 mg Q8h -Monitor for fever, wbc, temp, Scr Crcl -Measure antibiotic drug levels at steady state  Anabel Bene, PharmD Clinical Pharmacist Pager: [phone]  Anabel Bene 10/27/2012,11:19 AM

## 2012-10-27 NOTE — Progress Notes (Signed)
Patient's wound vac accidentally dislodged but patient denies removing it. PA Josh notified. Awaiting wound vac kit to reapply. Dry dsg applied until vac kit arrives.

## 2012-10-28 DIAGNOSIS — M009 Pyogenic arthritis, unspecified: Secondary | ICD-10-CM

## 2012-10-28 DIAGNOSIS — A4902 Methicillin resistant Staphylococcus aureus infection, unspecified site: Secondary | ICD-10-CM

## 2012-10-28 LAB — CBC WITH DIFFERENTIAL/PLATELET
Basophils Relative: 0 % (ref 0–1)
Eosinophils Absolute: 0 10*3/uL (ref 0.0–0.7)
HCT: 30.6 % — ABNORMAL LOW (ref 39.0–52.0)
Hemoglobin: 11 g/dL — ABNORMAL LOW (ref 13.0–17.0)
Lymphs Abs: 2.4 10*3/uL (ref 0.7–4.0)
MCH: 35.4 pg — ABNORMAL HIGH (ref 26.0–34.0)
MCHC: 35.9 g/dL (ref 30.0–36.0)
Monocytes Absolute: 0.7 10*3/uL (ref 0.1–1.0)
Monocytes Relative: 12 % (ref 3–12)
RBC: 3.11 MIL/uL — ABNORMAL LOW (ref 4.22–5.81)

## 2012-10-28 LAB — BASIC METABOLIC PANEL
BUN: 6 mg/dL (ref 6–23)
Chloride: 97 mEq/L (ref 96–112)
Creatinine, Ser: 0.55 mg/dL (ref 0.50–1.35)
GFR calc Af Amer: >90 mL/min (ref >90)
GFR calc non Af Amer: >90 mL/min (ref >90)
Glucose, Bld: 119 mg/dL — ABNORMAL HIGH (ref 70–99)
Potassium: 3.4 mEq/L — ABNORMAL LOW (ref 3.5–5.1)

## 2012-10-28 LAB — BODY FLUID CULTURE

## 2012-10-28 MED ORDER — LORAZEPAM 2 MG/ML IJ SOLN
0.5000 mg | Freq: Once | INTRAMUSCULAR | Status: AC
  Administered 2012-10-28: 1 mg via INTRAVENOUS
  Filled 2012-10-28: qty 1

## 2012-10-28 NOTE — Progress Notes (Signed)
PATIENT DETAILS Name: [name] [name] Age: 52 y.o. Sex: male Date of Birth: 1960/04/04 Admit Date: 10/25/2012 Admitting Physician Doree Albee, MD PCP:No primary provider on file.  [name] is a 52 yo male with significant past medical history of alcohol abuse, seizures, and brain aneurysm who presented with left elbow septic joint.  Patient was in jail 3 months ago in [facility] where he fell on left elbow and had multiple fractures.  In April 2014 patient had ORIF of proximal ulna and has had progressive swelling, pain, and redness since surgery.  Recently, patient was off seizure medications and had an unwitnessed seizure on 8/6 when he may have hit his left elbow.  Upon admission to [facility] ER, xray of left elbow demonstrated soft tissue swelling and fluid drained from elbow contained >170k WBCs, predominant neutrophils, gram stain was negative.  Dr. [name] was contacted about case, recommended intraarticular tobramycin injection by ER and patient transferred to [facility] for OR and medical admission.  Subjective: Low-grade fever of 100.1 yesterday afternoon. Per nurses staff patient has confused last night.? Alcohol withdrawal. For or in a.m. for second debridement, orthopedics please advise if he needs to be [facility].  Assessment/Plan:  Septic arthritis, Left elbow -H/o ORIF of proximal ulna -Joint aspiration with intraarticular tobramycin injection on 8/7 -Joint aspiration fluid showed >170k WBCs, primarily neutrophils, gram stain was negative. -On IV vancomycin, tobramycin for joint space irrigation.  -The internal fixation hardware was not removed.  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 carbamazepine  400 mg Oral TID  . folic acid  1 mg Oral Daily  . heparin  5,000 Units Subcutaneous Q8H  . metoprolol  100 mg Oral BID  . pantoprazole  40 mg Oral Daily  . thiamine  100 mg Oral Daily  . Tobramycin 40 mg in 0.9% NS 80 mL (0.5 mg/mL concentration)   Irrigation Q8H  . vancomycin  1,000 mg Intravenous Q8H   Continuous Infusions:  PRN Meds:.morphine injection, oxyCODONE  Antibiotics: Anti-infectives   Start     Dose/Rate Route Frequency Ordered Stop   10/27/12 1300  vancomycin (VANCOCIN) IVPB 1000 mg/200 mL premix     1,000 mg 200 mL/hr over 60 Minutes Intravenous Every 8 hours 10/27/12 1157     10/27/12 0000  Tobramycin 40 mg in 0.9% NS 80 mL (0.5 mg/mL concentration)     20 mL/hr over 48 Hours Irrigation Every 8 hours 10/26/12 2300     10/26/12 2200  tobramycin (NEBCIN) injection 40 mg  Status:  Discontinued     40 mg Intramuscular Every 8 hours 10/26/12 1637 10/26/12 2248   10/26/12 1445  sodium chloride 0.9 % 80 mL with tobramycin (NEBCIN) 40 mg infusion  Status:  Discontinued     10-20 mL/hr  Intravenous To Surgery 10/26/12 1436 10/26/12 1651   10/26/12 1442  tobramycin (NEBCIN) injection  Status:  Discontinued  As needed 10/26/12 1451 10/26/12 1527   10/26/12 0000  Tobramycin 40 mg in 0.9% NS 80 mL (0.5 mg/mL concentration)  Status:  Discontinued     20 mL/hr  Irrigation Every 8 hours 10/26/12 2248 10/26/12 2300   10/25/12 2000  vancomycin (VANCOCIN) IVPB 1000 mg/200 mL premix     1,000 mg 200 mL/hr over 60 Minutes Intravenous  Once 10/25/12  1853 10/25/12 2312   10/25/12 1930  piperacillin-tazobactam (ZOSYN) IVPB 3.375 g     3.375 g 100 mL/hr over 30 Minutes Intravenous  Once 10/25/12 1853 10/25/12 2114   10/25/12 1800  tobramycin (NEBCIN) injection 40 mg  Status:  Discontinued     40 mg Intramuscular  Once 10/25/12 1758 10/26/12 1636       PHYSICAL EXAM: Vital signs in last 24 hours: Filed Vitals:   10/27/12 1337 10/27/12 2152 10/28/12 0528 10/28/12 1044  BP: 143/89 144/95 155/85 133/83  Pulse: 120 76 72 92  Temp: 100.1 F (37.8 C) 98.4 F (36.9 C) 98.2 F (36.8 C)   TempSrc: Oral Oral Oral   Resp: 18 18 18    Height:      Weight:      SpO2: 96% 97% 98%     Weight change:  Filed Weights   10/25/12 2202  Weight: 81.9 kg (180 lb 8.9 oz)   Body mass index is 20.87 kg/(m^2).   Gen Exam: Awake and alert with clear speech.   Neck: Supple, No JVD.   Chest: B/L Clear.   CVS: S1 S2 Regular, no murmurs.  Abdomen: soft, BS +, non tender, non distended.  Extremities: No edema, lower extremities warm to touch.  Left elbow shows well-healed scar from ORIF.  Posterior left elbow swelling, very warm to touch, and painful to palpation with decreased ROM.   Neurologic: Non Focal.   Skin: No Rash.      Intake/Output from previous day:  Intake/Output Summary (Last 24 hours) at 10/28/12 1152 Last data filed at 10/28/12 1010  Gross per 24 hour  Intake    600 ml  Output    575 ml  Net     25 ml     LAB RESULTS: CBC  Recent Labs Lab 10/25/12 1416 10/25/12 1424 10/26/12 0454 10/27/12 0516 10/28/12 0610  WBC 7.5  --  7.7 7.1 5.6  HGB 13.3 14.6 12.4* 10.6* 11.0*  HCT 38.0* 43.0 34.4* 29.7* 30.6*  PLT 85*  --  64* 61* 83*  MCV 98.7  --  97.7 99.7 98.4  MCH 34.5*  --  35.2* 35.6* 35.4*  MCHC 35.0  --  36.0 35.7 35.9  RDW 12.7  --  12.7 13.0 12.7  LYMPHSABS 1.9  --  2.3 2.8 2.4  MONOABS 0.8  --  1.0 0.9 0.7  EOSABS 0.0  --  0.0 0.0 0.0  BASOSABS 0.0  --  0.0 0.0 0.0    Chemistries   Recent Labs Lab  10/25/12 1424 10/26/12 0454 10/27/12 0516 10/28/12 0610  NA 135 133* 133* 134*  K 3.8 3.7 3.5 3.4*  CL 98 98 99 97  CO2  --  26 25 27   GLUCOSE 131* 127* 114* 119*  BUN 6 7 7 6   CREATININE 0.70 0.59 0.59 0.55  CALCIUM  --  8.8 8.0* 8.5     GFR Estimated Creatinine Clearance: 123.7 ml/min (by C-G formula based on Cr of 0.55).  Coagulation profile  Recent Labs Lab 10/27/12 0516  INR 0.99     MICROBIOLOGY:  Recent Results (from the past 240 hour(s))  BODY FLUID CULTURE     Status: None   Collection Time    10/25/12  2:04 PM      Result Value Range Status   Specimen Description SYNOVIAL JOINT,ELBOW   Final   Special Requests Normal   Final   Gram Stain     Final   Value: ABUNDANT WBC PRESENT, PREDOMINANTLY PMN     NO ORGANISMS SEEN     Gram Stain Report Called to,Read Back By and Verified With: Gram Stain Report Called to,Read Back By and Verified With: S WEST 1650 10/25/12 BY MURPHYD Performed by [facility]     Performed at [facility]   Culture     Final   Value: FEW METHICILLIN RESISTANT STAPHYLOCOCCUS AUREUS     Note: RIFAMPIN AND GENTAMICIN SHOULD NOT BE USED AS SINGLE DRUGS FOR TREATMENT OF STAPH INFECTIONS. This organism DOES NOT demonstrate inducible Clindamycin resistance in vitro.     Note: CRITICAL RESULT CALLED TO, READ BACK BY AND VERIFIED WITH: ROBIN ZONER 10/27/12 @ 4;58PM BY RUSCOE A. CRITICAL RESULT CALLED TO, READ BACK BY AND VERIFIED WITH: DORA GARDNER 10/27/12 @ 7:14PM BY RUSCOE A.     Performed at [facility]   Report Status 10/28/2012 FINAL   Final   Organism ID, Bacteria METHICILLIN RESISTANT STAPHYLOCOCCUS AUREUS   Final  GRAM STAIN     Status: None   Collection Time    10/25/12  2:04 PM      Result Value Range Status   Specimen Description SYNOVIAL JOINT,ELBOW   Final   Special Requests Normal   Final   Gram Stain     Final   Value: WBC PRESENT, PREDOMINANTLY PMN     NO ORGANISMS SEEN     Gram Stain Report Called  to,Read Back By and Verified With: S.WEST/[id number]/[email or web address]     CYTOSPIN   Report Status 10/25/2012 FINAL   Final  SURGICAL PCR SCREEN     Status: None   Collection Time    10/26/12  7:22 AM      Result Value Range Status   MRSA, PCR NEGATIVE  NEGATIVE Final   Staphylococcus aureus NEGATIVE  NEGATIVE Final   Comment:            The Xpert SA Assay (FDA     approved for NASAL specimens     in patients over 71 years of age),     is one component of     a comprehensive surveillance     program.  Test performance has     been validated by [employer] for patients greater     than or equal to 43 year old.     It is not intended     to diagnose infection nor to     guide or monitor treatment.    RADIOLOGY STUDIES/RESULTS: Dg Elbow Complete Left  10/25/2012   *RADIOLOGY REPORT*  Clinical Data: Diffuse left elbow pain and swelling.  Surgery in April 2014.  Recent fall.  LEFT ELBOW - COMPLETE 3+ VIEW  Comparison: No prior elbow graft radiographs available for comparison  Findings: The elbow is located.  Best seen in the sagittal projection is a subacute appearing fracture of the proximal ulna, near the level of the elbow joint.  There are postoperative changes of internal fixation of the proximal ulna, extending from the olecranon to the proximal diaphysis, including a cortical plate  and multiple screws. The hardware appears intact.  No definite acute fracture is identified.  There is a joint effusion.  Diffuse subcutaneous soft tissue swelling is seen about the elbow.  There are tiny calcifications post in the soft tissues posterior to the elbow joint, of uncertain chronicity.  IMPRESSION:  1.  Elbow joint effusion and diffuse soft tissue swelling posterior to the elbow. 2.  Subacute appearing fracture of the proximal ulna in this patient status post ORIF of the proximal ulna.   Original Report Authenticated By: Britta Mccreedy, M.D.    [facility]   If 7PM-7AM, please contact  night-coverage [email or web address] Password [email or web address] 10/28/2012, 11:52 AM   LOS: 3 days

## 2012-10-28 NOTE — Progress Notes (Signed)
[facility]   Day # 4 vancomycin  Subjective: No new complaints   Antibiotics:  Anti-infectives   Start     Dose/Rate Route Frequency Ordered Stop   10/27/12 1300  vancomycin (VANCOCIN) IVPB 1000 mg/200 mL premix     1,000 mg 200 mL/hr over 60 Minutes Intravenous Every 8 hours 10/27/12 1157     10/27/12 0000  Tobramycin 40 mg in 0.9% NS 80 mL (0.5 mg/mL concentration)     20 mL/hr over 48 Hours Irrigation Every 8 hours 10/26/12 2300     10/26/12 2200  tobramycin (NEBCIN) injection 40 mg  Status:  Discontinued     40 mg Intramuscular Every 8 hours 10/26/12 1637 10/26/12 2248   10/26/12 1445  sodium chloride 0.9 % 80 mL with tobramycin (NEBCIN) 40 mg infusion  Status:  Discontinued     10-20 mL/hr  Intravenous To Surgery 10/26/12 1436 10/26/12 1651   10/26/12 1442  tobramycin (NEBCIN) injection  Status:  Discontinued       As needed 10/26/12 1451 10/26/12 1527   10/26/12 0000  Tobramycin 40 mg in 0.9% NS 80 mL (0.5 mg/mL concentration)  Status:  Discontinued     20 mL/hr  Irrigation Every 8 hours 10/26/12 2248 10/26/12 2300   10/25/12 2000  vancomycin (VANCOCIN) IVPB 1000 mg/200 mL premix     1,000 mg 200 mL/hr over 60 Minutes Intravenous  Once 10/25/12 1853 10/25/12 2312   10/25/12 1930  piperacillin-tazobactam (ZOSYN) IVPB 3.375 g     3.375 g 100 mL/hr over 30 Minutes Intravenous  Once 10/25/12 1853 10/25/12 2114   10/25/12 1800  tobramycin (NEBCIN) injection 40 mg  Status:  Discontinued     40 mg Intramuscular  Once 10/25/12 1758 10/26/12 1636      Medications: Scheduled Meds: . carbamazepine  400 mg Oral TID  . folic acid  1 mg Oral Daily  . heparin  5,000 Units Subcutaneous Q8H  . metoprolol  100 mg Oral BID  . pantoprazole  40 mg Oral Daily  . thiamine  100 mg Oral Daily  . Tobramycin 40 mg in 0.9% NS 80 mL (0.5 mg/mL concentration)   Irrigation Q8H  . vancomycin  1,000 mg Intravenous Q8H   Continuous Infusions:  PRN Meds:.morphine  injection, oxyCODONE   Objective: Weight change:   Intake/Output Summary (Last 24 hours) at 10/28/12 1542 Last data filed at 10/28/12 1526  Gross per 24 hour  Intake    600 ml  Output    975 ml  Net   -375 ml   Blood pressure 155/76, pulse 73, temperature 99 F (37.2 C), temperature source Oral, resp. rate 18, height 6\' 6"  (1.981 m), weight 180 lb 8.9 oz (81.9 kg), SpO2 98.00%. Temp:  [98.2 F (36.8 C)-99 F (37.2 C)] 99 F (37.2 C) (08/10 1525) Pulse Rate:  [72-92] 73 (08/10 1525) Resp:  [18] 18 (08/10 1525) BP: (133-155)/(76-95) 155/76 mmHg (08/10 1525) SpO2:  [97 %-98 %] 98 % (08/10 1525)  Physical Exam: General: Alert and awake, communication still  Not easy today. He is against going to [facility]  HEENT: anicteric sclera, pupils reactive to light and accommodation, EOMI, oropharynx clear and without exudate  CVS regular rate, normal r, no murmur rubs or gallops  Chest: clear to auscultation bilaterally, no wheezing, rales or rhonchi  Abdomen: soft nontender, nondistended, normal bowel sounds,  Extremities left elbow in sling and bandaged  Skin: no rashes  Neuro: nonfocal, strength  and sensation intact, confused   Lab Results:  Recent Labs  10/27/12 0516 10/28/12 0610  WBC 7.1 5.6  HGB 10.6* 11.0*  HCT 29.7* 30.6*  PLT 61* 83*    BMET  Recent Labs  10/27/12 0516 10/28/12 0610  NA 133* 134*  K 3.5 3.4*  CL 99 97  CO2 25 27  GLUCOSE 114* 119*  BUN 7 6  CREATININE 0.59 0.55  CALCIUM 8.0* 8.5    Micro Results: Recent Results (from the past 240 hour(s))  BODY FLUID CULTURE     Status: None   Collection Time    10/25/12  2:04 PM      Result Value Range Status   Specimen Description SYNOVIAL JOINT,ELBOW   Final   Special Requests Normal   Final   Gram Stain     Final   Value: ABUNDANT WBC PRESENT, PREDOMINANTLY PMN     NO ORGANISMS SEEN     Gram Stain Report Called to,Read Back By and Verified With: Gram Stain Report Called to,Read Back By and  Verified With: S WEST 1650 10/25/12 BY MURPHYD Performed by [facility]     Performed at [facility]   Culture     Final   Value: FEW METHICILLIN RESISTANT STAPHYLOCOCCUS AUREUS     Note: RIFAMPIN AND GENTAMICIN SHOULD NOT BE USED AS SINGLE DRUGS FOR TREATMENT OF STAPH INFECTIONS. This organism DOES NOT demonstrate inducible Clindamycin resistance in vitro.     Note: CRITICAL RESULT CALLED TO, READ BACK BY AND VERIFIED WITH: ROBIN ZONER 10/27/12 @ 4;58PM BY RUSCOE A. CRITICAL RESULT CALLED TO, READ BACK BY AND VERIFIED WITH: DORA GARDNER 10/27/12 @ 7:14PM BY RUSCOE A.     Performed at [facility]   Report Status 10/28/2012 FINAL   Final   Organism ID, Bacteria METHICILLIN RESISTANT STAPHYLOCOCCUS AUREUS   Final  GRAM STAIN     Status: None   Collection Time    10/25/12  2:04 PM      Result Value Range Status   Specimen Description SYNOVIAL JOINT,ELBOW   Final   Special Requests Normal   Final   Gram Stain     Final   Value: WBC PRESENT, PREDOMINANTLY PMN     NO ORGANISMS SEEN     Gram Stain Report Called to,Read Back By and Verified With: S.WEST/[id number]/[email or web address]     CYTOSPIN   Report Status 10/25/2012 FINAL   Final  SURGICAL PCR SCREEN     Status: None   Collection Time    10/26/12  7:22 AM      Result Value Range Status   MRSA, PCR NEGATIVE  NEGATIVE Final   Staphylococcus aureus NEGATIVE  NEGATIVE Final   Comment:            The Xpert SA Assay (FDA     approved for NASAL specimens     in patients over 31 years of age),     is one component of     a comprehensive surveillance     program.  Test performance has     been validated by [employer] for patients greater     than or equal to 55 year old.     It is not intended     to diagnose infection nor to     guide or monitor treatment.    Studies/Results: No results found.    Assessment/Plan: [name] [name] is a 52 y.o. 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  --Continue vancomycin with goal trough 15-20  - considered adding rifampin but WILL NOT given his eothism, and hep c and liver disease   --I am not completely convinced he could safely go home with IV abx and PICC line, will need to discuss further with pt and team, oral options could be doxy or Bactrim, his low platelets would preclude use of zyvox   #2 Screening; should have had hiv testing in prison but will repeat   #3 Hepatitis C genotype 1b:  --WOULD RECOMMEND getting him into care with Hepatologist, Dr Keane Police group from [facility] gomes [facility] to [facility], he would benefit from being on NEW ALL oral regimen with high cure rate, low toxicities. He is at VERY high risk for progression of his cirrhosis with untreated hep c, etohism, AA race    LOS: 3 days   Acey Lav 10/28/2012, 3:42 PM

## 2012-10-28 NOTE — Progress Notes (Signed)
Subjective: 2 Days Post-Op Procedure(s) (LRB): IRRIGATION AND DEBRIDEMENT Left Elbow (Left) Patient reports pain as moderate.    Objective: Vital signs in last 24 hours: Temp:  [98.2 F (36.8 C)-100.1 F (37.8 C)] 98.2 F (36.8 C) (08/10 0528) Pulse Rate:  [72-120] 72 (08/10 0528) Resp:  [18] 18 (08/10 0528) BP: (143-155)/(85-95) 155/85 mmHg (08/10 0528) SpO2:  [96 %-98 %] 98 % (08/10 0528)  Intake/Output from previous day: 08/09 0701 - 08/10 0700 In: 780 [P.O.:780] Out: 775 [Urine:775] Intake/Output this shift:     Recent Labs  10/25/12 1416 10/25/12 1424 10/26/12 0454 10/27/12 0516 10/28/12 0610  HGB 13.3 14.6 12.4* 10.6* 11.0*    Recent Labs  10/27/12 0516 10/28/12 0610  WBC 7.1 5.6  RBC 2.98* 3.11*  HCT 29.7* 30.6*  PLT 61* 83*    Recent Labs  10/27/12 0516 10/28/12 0610  NA 133* 134*  K 3.5 3.4*  CL 99 97  CO2 25 27  BUN 7 6  CREATININE 0.59 0.55  GLUCOSE 114* 119*  CALCIUM 8.0* 8.5    Recent Labs  10/27/12 0516  INR 0.99    Neurovascular intact Sensation intact distally Intact pulses distally Incision: dressing C/D/I  Assessment/Plan: 2 Days Post-Op Procedure(s) (LRB): IRRIGATION AND DEBRIDEMENT Left Elbow (Left) Wound vac intact and functioning Cont IV abx and IM cath inj as ordered Plan for OR tomorrow repeat I&D NPO after midnight tonight for planned surgery tomorrow  Margart Sickles 10/28/2012, 9:03 AM

## 2012-10-29 ENCOUNTER — Inpatient Hospital Stay (HOSPITAL_COMMUNITY): Payer: Medicaid Other | Admitting: Anesthesiology

## 2012-10-29 ENCOUNTER — Encounter (HOSPITAL_COMMUNITY): Payer: Self-pay | Admitting: Certified Registered Nurse Anesthetist

## 2012-10-29 ENCOUNTER — Encounter (HOSPITAL_COMMUNITY): Payer: Self-pay | Admitting: Anesthesiology

## 2012-10-29 ENCOUNTER — Inpatient Hospital Stay (HOSPITAL_COMMUNITY): Payer: Medicaid Other

## 2012-10-29 ENCOUNTER — Encounter (HOSPITAL_COMMUNITY)
Admission: EM | Disposition: A | Discharge status: Home / Self Care | Payer: Self-pay | Source: Home / Self Care | Attending: Internal Medicine

## 2012-10-29 DIAGNOSIS — Z5189 Encounter for other specified aftercare: Secondary | ICD-10-CM

## 2012-10-29 HISTORY — PX: HARDWARE REMOVAL: SHX979

## 2012-10-29 HISTORY — PX: I & D EXTREMITY: SHX5045

## 2012-10-29 LAB — CARBAMAZEPINE, FREE AND TOTAL: Carbamazepine, Bound: 0.6 ug/mL

## 2012-10-29 LAB — VANCOMYCIN, TROUGH: Vancomycin Tr: 8.4 ug/mL — ABNORMAL LOW (ref 10.0–20.0)

## 2012-10-29 SURGERY — IRRIGATION AND DEBRIDEMENT EXTREMITY
Anesthesia: General | Site: Elbow | Laterality: Left | Wound class: Dirty or Infected

## 2012-10-29 MED ORDER — MIDAZOLAM HCL 5 MG/5ML IJ SOLN
INTRAMUSCULAR | Status: DC | PRN
  Administered 2012-10-29: 2 mg via INTRAVENOUS

## 2012-10-29 MED ORDER — DOCUSATE SODIUM 100 MG PO CAPS
200.0000 mg | ORAL_CAPSULE | Freq: Two times a day (BID) | ORAL | Status: DC
  Administered 2012-10-30 – 2012-11-02 (×7): 200 mg via ORAL
  Filled 2012-10-29 (×8): qty 2

## 2012-10-29 MED ORDER — ARTIFICIAL TEARS OP OINT
TOPICAL_OINTMENT | OPHTHALMIC | Status: DC | PRN
  Administered 2012-10-29: 1 via OPHTHALMIC

## 2012-10-29 MED ORDER — ONDANSETRON HCL 4 MG/2ML IJ SOLN
INTRAMUSCULAR | Status: DC | PRN
  Administered 2012-10-29: 4 mg via INTRAVENOUS

## 2012-10-29 MED ORDER — PHENYLEPHRINE HCL 10 MG/ML IJ SOLN
INTRAMUSCULAR | Status: DC | PRN
  Administered 2012-10-29 (×8): 80 ug via INTRAVENOUS

## 2012-10-29 MED ORDER — SODIUM CHLORIDE 0.9 % IR SOLN
Status: DC | PRN
  Administered 2012-10-29: 1000 mL
  Administered 2012-10-29: 3000 mL

## 2012-10-29 MED ORDER — FENTANYL CITRATE 0.05 MG/ML IJ SOLN
INTRAMUSCULAR | Status: DC | PRN
  Administered 2012-10-29 (×2): 100 ug via INTRAVENOUS

## 2012-10-29 MED ORDER — VANCOMYCIN HCL 500 MG IV SOLR
INTRAVENOUS | Status: DC | PRN
  Administered 2012-10-29: 250 mg

## 2012-10-29 MED ORDER — GENTAMICIN SULFATE 40 MG/ML IJ SOLN
INTRAMUSCULAR | Status: AC
  Filled 2012-10-29: qty 2

## 2012-10-29 MED ORDER — GENTAMICIN SULFATE 40 MG/ML IJ SOLN
INTRAMUSCULAR | Status: DC | PRN
  Administered 2012-10-29: 120 mg

## 2012-10-29 MED ORDER — GLYCOPYRROLATE 0.2 MG/ML IJ SOLN
INTRAMUSCULAR | Status: DC | PRN
  Administered 2012-10-29: .8 mg via INTRAVENOUS
  Administered 2012-10-29: 0.2 mg via INTRAVENOUS

## 2012-10-29 MED ORDER — HYDROMORPHONE HCL PF 1 MG/ML IJ SOLN
INTRAMUSCULAR | Status: AC
  Filled 2012-10-29: qty 1

## 2012-10-29 MED ORDER — OXYCODONE HCL 5 MG/5ML PO SOLN: 5.0000 mg | Freq: Once | ORAL | Status: AC | PRN

## 2012-10-29 MED ORDER — DOCUSATE SODIUM 100 MG PO CAPS
100.0000 mg | ORAL_CAPSULE | Freq: Two times a day (BID) | ORAL | Status: DC
  Administered 2012-10-29 (×2): 100 mg via ORAL
  Filled 2012-10-29 (×3): qty 1

## 2012-10-29 MED ORDER — LIDOCAINE HCL (CARDIAC) 20 MG/ML IV SOLN
INTRAVENOUS | Status: DC | PRN
  Administered 2012-10-29: 65 mg via INTRAVENOUS

## 2012-10-29 MED ORDER — LACTATED RINGERS IV SOLN
INTRAVENOUS | Status: DC | PRN
  Administered 2012-10-29 (×2): via INTRAVENOUS

## 2012-10-29 MED ORDER — ONDANSETRON HCL 4 MG/2ML IJ SOLN: 4.0000 mg | Freq: Once | INTRAMUSCULAR | Status: AC | PRN

## 2012-10-29 MED ORDER — DEXAMETHASONE SODIUM PHOSPHATE 4 MG/ML IJ SOLN
INTRAMUSCULAR | Status: DC | PRN
  Administered 2012-10-29: 8 mg via INTRAVENOUS

## 2012-10-29 MED ORDER — VANCOMYCIN HCL 500 MG IV SOLR
INTRAVENOUS | Status: AC
  Filled 2012-10-29: qty 500

## 2012-10-29 MED ORDER — SENNA 8.6 MG PO TABS
2.0000 | ORAL_TABLET | Freq: Once | ORAL | Status: AC
  Administered 2012-10-30: 17.2 mg via ORAL
  Filled 2012-10-29: qty 2

## 2012-10-29 MED ORDER — NEOSTIGMINE METHYLSULFATE 1 MG/ML IJ SOLN
INTRAMUSCULAR | Status: DC | PRN
  Administered 2012-10-29: 5 mg via INTRAVENOUS

## 2012-10-29 MED ORDER — ROCURONIUM BROMIDE 100 MG/10ML IV SOLN
INTRAVENOUS | Status: DC | PRN
  Administered 2012-10-29: 30 mg via INTRAVENOUS
  Administered 2012-10-29: 20 mg via INTRAVENOUS

## 2012-10-29 MED ORDER — PROPOFOL 10 MG/ML IV BOLUS
INTRAVENOUS | Status: DC | PRN
  Administered 2012-10-29: 160 mg via INTRAVENOUS

## 2012-10-29 MED ORDER — LIDOCAINE HCL 4 % MT SOLN
OROMUCOSAL | Status: DC | PRN
  Administered 2012-10-29: 4 mL via TOPICAL

## 2012-10-29 MED ORDER — VANCOMYCIN HCL 10 G IV SOLR
1250.0000 mg | Freq: Three times a day (TID) | INTRAVENOUS | Status: DC
  Administered 2012-10-30 – 2012-10-31 (×6): 1250 mg via INTRAVENOUS
  Filled 2012-10-29 (×9): qty 1250

## 2012-10-29 MED ORDER — HYDROMORPHONE HCL PF 1 MG/ML IJ SOLN
0.2500 mg | INTRAMUSCULAR | Status: DC | PRN
  Administered 2012-10-29: 1 mg via INTRAVENOUS

## 2012-10-29 MED ORDER — OXYCODONE HCL 5 MG PO TABS: 5.0000 mg | ORAL_TABLET | Freq: Once | ORAL | Status: AC | PRN

## 2012-10-29 SURGICAL SUPPLY — 23 items
BENZOIN TINCTURE PRP APPL 2/3 (GAUZE/BANDAGES/DRESSINGS) ×2 IMPLANT
CLOTH BEACON ORANGE TIMEOUT ST (SAFETY) ×2 IMPLANT
COVER SURGICAL LIGHT HANDLE (MISCELLANEOUS) ×2 IMPLANT
DRSG VAC ATS SM SENSATRAC (GAUZE/BANDAGES/DRESSINGS) ×2 IMPLANT
DURAPREP 26ML APPLICATOR (WOUND CARE) ×2 IMPLANT
ELECT PAD GROUND ADT 9 (MISCELLANEOUS) ×2 IMPLANT
FLUID NSS /IRRIG 3000 ML XXX (IV SOLUTION) ×2 IMPLANT
GLOVE BIO SURGEON STRL SZ7.5 (GLOVE) ×2 IMPLANT
GLOVE BIOGEL PI IND STRL 8 (GLOVE) ×1 IMPLANT
GLOVE BIOGEL PI INDICATOR 8 (GLOVE) ×1
GOWN STRL NON-REIN LRG LVL3 (GOWN DISPOSABLE) ×2 IMPLANT
KIT BASIN OR (CUSTOM PROCEDURE TRAY) ×2 IMPLANT
KIT STIMULAN 5CC (Orthopedic Implant) ×2 IMPLANT
NS IRRIG 1000ML POUR BTL (IV SOLUTION) ×2 IMPLANT
PACK ORTHO EXTREMITY (CUSTOM PROCEDURE TRAY) ×2 IMPLANT
SLING SWATHE LARGE (SOFTGOODS) ×2 IMPLANT
SUT PDS AB 1 CT  36 (SUTURE) ×1
SUT PDS AB 1 CT 36 (SUTURE) ×1 IMPLANT
TOWEL OR 17X24 6PK STRL BLUE (TOWEL DISPOSABLE) ×2 IMPLANT
TOWEL OR 17X26 10 PK STRL BLUE (TOWEL DISPOSABLE) ×2 IMPLANT
TUBING BULK SUCTION (MISCELLANEOUS) ×2 IMPLANT
TUBING CYSTO DISP (UROLOGICAL SUPPLIES) ×2 IMPLANT
YANKAUER SUCT BULB TIP NO VENT (SUCTIONS) ×2 IMPLANT

## 2012-10-29 NOTE — Interval H&P Note (Signed)
History and Physical Interval Note:  10/29/2012 6:51 AM  [name] [name]  has presented today for surgery, with the diagnosis of septic Left elbow  The various methods of treatment have been discussed with the patient and family. After consideration of risks, benefits and other options for treatment, the patient has consented to  Procedure(s) with comments: IRRIGATION AND DEBRIDEMENT EXTREMITY (Left) - lateral on bean bag as a surgical intervention .  The patient's history has been reviewed, patient examined, no change in status, stable for surgery.  I have reviewed the patient's chart and labs.  Questions were answered to the patient's satisfaction.     MURPHY, TIMOTHY, D

## 2012-10-29 NOTE — Progress Notes (Signed)
PATIENT DETAILS Name: [name] [name] Age: 52 y.o. Sex: male Date of Birth: May 19, 1960 Admit Date: 10/25/2012 Admitting Physician Doree Albee, MD PCP:No primary provider on file.  Mr. Azure is a 52 yo male with significant past medical history of alcohol abuse, seizures, and brain aneurysm who presented with left elbow septic joint.  Patient was in jail 3 months ago in [facility] where he fell on left elbow and had multiple fractures.  In April 2014 patient had ORIF of proximal ulna and has had progressive swelling, pain, and redness since surgery.  Recently, patient was off seizure medications and had an unwitnessed seizure on 8/6 when he may have hit his left elbow.  Upon admission to [facility] ER, xray of left elbow demonstrated soft tissue swelling and fluid drained from elbow contained >170k WBCs, predominant neutrophils, gram stain was negative.  Dr. [name] was contacted about case, recommended intraarticular tobramycin injection by ER and patient transferred to [facility] for OR and medical admission.  Subjective: Low-grade fever of 99 yesterday afternoon, overall his fever curve is going down. Had another debridement today done by Dr. [name].  Assessment/Plan:  Septic arthritis, Left elbow -H/o ORIF of proximal ulna, MRSA septic arthritis -Joint aspiration with intraarticular tobramycin injection on 8/7 -Joint aspiration fluid showed >170k WBCs, primarily neutrophils, gram stain was negative. -On IV vancomycin, tobramycin for joint space irrigation.  -Has complex situation with PICC line in his right hand, left hand in a cast and IV antibiotics.? Needs SNF.  Seizure -Continue carbamazepine -Carbamazepine levels checked 8/7, low at <0.5 -Monitor for seizure activity  Hypertension -Stable -Continue metoprolol   EtOH abuse -CIWA protocol -Ammonia level 8/7, upper limits of normal -Continue IV thiamine and folate  Probable Chronic Liver Disease -Likely cirrhosis  secondary to Hep C and history of alcoholism -Avoid hepatotoxic medications, d/c percocet and start oxycodone without acetaminophen -Continue to monitor CMP -Normal INR  Disposition: Remain inpatient  DVT Prophylaxis: Prophylactic Heparin  Code Status: Full code  Family Communication None at bedside  Procedures:  None  CONSULTS:  orthopedic surgery and ID   MEDICATIONS: Scheduled Meds: . carbamazepine  400 mg Oral TID  . docusate sodium  100 mg Oral BID  . folic acid  1 mg Oral Daily  . heparin  5,000 Units Subcutaneous Q8H  . metoprolol  100 mg Oral BID  . pantoprazole  40 mg Oral Daily  . thiamine  100 mg Oral Daily  . Tobramycin 40 mg in 0.9% NS 80 mL (0.5 mg/mL concentration)   Irrigation Q8H  . vancomycin  1,000 mg Intravenous Q8H   Continuous Infusions:  PRN Meds:.HYDROmorphone (DILAUDID) injection, morphine injection, ondansetron (ZOFRAN) IV, oxyCODONE, oxyCODONE, oxyCODONE  Antibiotics: Anti-infectives   Start     Dose/Rate Route Frequency Ordered Stop   10/29/12 1105  gentamicin (GARAMYCIN) injection  Status:  Discontinued       As needed 10/29/12 1105 10/29/12 1126   10/29/12 1102  vancomycin (VANCOCIN) powder  Status:  Discontinued       As needed 10/29/12 1103 10/29/12 1126   10/27/12 1300  vancomycin (VANCOCIN) IVPB 1000 mg/200 mL premix     1,000 mg 200 mL/hr over 60 Minutes Intravenous Every 8 hours 10/27/12 1157     10/27/12 0000  Tobramycin 40 mg in 0.9% NS 80 mL (0.5 mg/mL concentration)     20 mL/hr over 48 Hours Irrigation Every 8 hours 10/26/12 2300     10/26/12 2200  tobramycin (NEBCIN) injection 40 mg  Status:  Discontinued     40 mg Intramuscular Every 8 hours 10/26/12 1637 10/26/12 2248   10/26/12 1445  sodium chloride 0.9 % 80 mL with tobramycin (NEBCIN) 40 mg infusion  Status:  Discontinued     10-20 mL/hr  Intravenous To Surgery 10/26/12 1436 10/26/12 1651   10/26/12 1442  tobramycin (NEBCIN) injection  Status:  Discontinued        As needed 10/26/12 1451 10/26/12 1527   10/26/12 0000  Tobramycin 40 mg in 0.9% NS 80 mL (0.5 mg/mL concentration)  Status:  Discontinued     20 mL/hr  Irrigation Every 8 hours 10/26/12 2248 10/26/12 2300   10/25/12 2000  vancomycin (VANCOCIN) IVPB 1000 mg/200 mL premix     1,000 mg 200 mL/hr over 60 Minutes Intravenous  Once 10/25/12 1853 10/25/12 2312   10/25/12 1930  piperacillin-tazobactam (ZOSYN) IVPB 3.375 g     3.375 g 100 mL/hr over 30 Minutes Intravenous  Once 10/25/12 1853 10/25/12 2114   10/25/12 1800  tobramycin (NEBCIN) injection 40 mg  Status:  Discontinued     40 mg Intramuscular  Once 10/25/12 1758 10/26/12 1636       PHYSICAL EXAM: Vital signs in last 24 hours: Filed Vitals:   10/29/12 1230 10/29/12 1233 10/29/12 1245 10/29/12 1247  BP:  122/72    Pulse: 63 63 64   Temp:    98.7 F (37.1 C)  TempSrc:      Resp: 11 11 13    Height:      Weight:      SpO2: 99% 99% 98%     Weight change:  Filed Weights   10/25/12 2202  Weight: 81.9 kg (180 lb 8.9 oz)   Body mass index is 20.87 kg/(m^2).   Gen Exam: Awake and alert with clear speech.   Neck: Supple, No JVD.   Chest: B/L Clear.   CVS: S1 S2 Regular, no murmurs.  Abdomen: soft, BS +, non tender, non distended.  Extremities: No edema, lower extremities warm to touch.  Left elbow shows well-healed scar from ORIF.  Posterior left elbow swelling, very warm to touch, and painful to palpation with decreased ROM.   Neurologic: Non Focal.   Skin: No Rash.      Intake/Output from previous day:  Intake/Output Summary (Last 24 hours) at 10/29/12 1447 Last data filed at 10/29/12 1248  Gross per 24 hour  Intake 3248.33 ml  Output   1635 ml  Net 1613.33 ml     LAB RESULTS: CBC  Recent Labs Lab 10/25/12 1416 10/25/12 1424 10/26/12 0454 10/27/12 0516 10/28/12 0610  WBC 7.5  --  7.7 7.1 5.6  HGB 13.3 14.6 12.4* 10.6* 11.0*  HCT 38.0* 43.0 34.4* 29.7* 30.6*  PLT 85*  --  64* 61* 83*  MCV 98.7  --  97.7  99.7 98.4  MCH 34.5*  --  35.2* 35.6* 35.4*  MCHC 35.0  --  36.0 35.7 35.9  RDW 12.7  --  12.7 13.0 12.7  LYMPHSABS 1.9  --  2.3 2.8 2.4  MONOABS 0.8  --  1.0 0.9 0.7  EOSABS 0.0  --  0.0 0.0 0.0  BASOSABS 0.0  --  0.0 0.0 0.0    Chemistries   Recent Labs Lab 10/25/12 1424 10/26/12 0454 10/27/12 0516 10/28/12 0610  NA 135 133* 133* 134*  K 3.8 3.7 3.5 3.4*  CL 98 98 99 97  CO2  --  26 25 27   GLUCOSE 131* 127* 114* 119*  BUN 6 7 7 6   CREATININE 0.70 0.59 0.59 0.55  CALCIUM  --  8.8 8.0* 8.5     GFR Estimated Creatinine Clearance: 123.7 ml/min (by C-G formula based on Cr of 0.55).  Coagulation profile  Recent Labs Lab 10/27/12 0516  INR 0.99     MICROBIOLOGY: Recent Results (from the past 240 hour(s))  BODY FLUID CULTURE     Status: None   Collection Time    10/25/12  2:04 PM      Result Value Range Status   Specimen Description SYNOVIAL JOINT,ELBOW   Final   Special Requests Normal   Final   Gram Stain     Final   Value: ABUNDANT WBC PRESENT, PREDOMINANTLY PMN     NO ORGANISMS SEEN     Gram Stain Report Called to,Read Back By and Verified With: Gram Stain Report Called to,Read Back By and Verified With: S WEST 1650 10/25/12 BY MURPHYD Performed by [facility]     Performed at [facility]   Culture     Final   Value: FEW METHICILLIN RESISTANT STAPHYLOCOCCUS AUREUS     Note: RIFAMPIN AND GENTAMICIN SHOULD NOT BE USED AS SINGLE DRUGS FOR TREATMENT OF STAPH INFECTIONS. This organism DOES NOT demonstrate inducible Clindamycin resistance in vitro.     Note: CRITICAL RESULT CALLED TO, READ BACK BY AND VERIFIED WITH: ROBIN ZONER 10/27/12 @ 4;58PM BY RUSCOE A. CRITICAL RESULT CALLED TO, READ BACK BY AND VERIFIED WITH: DORA GARDNER 10/27/12 @ 7:14PM BY RUSCOE A.     Performed at [facility]   Report Status 10/28/2012 FINAL   Final   Organism ID, Bacteria METHICILLIN RESISTANT STAPHYLOCOCCUS AUREUS   Final  GRAM STAIN     Status: None    Collection Time    10/25/12  2:04 PM      Result Value Range Status   Specimen Description SYNOVIAL JOINT,ELBOW   Final   Special Requests Normal   Final   Gram Stain     Final   Value: WBC PRESENT, PREDOMINANTLY PMN     NO ORGANISMS SEEN     Gram Stain Report Called to,Read Back By and Verified With: [id number]/[email or web address]     CYTOSPIN   Report Status 10/25/2012 FINAL   Final  SURGICAL PCR SCREEN     Status: None   Collection Time    10/26/12  7:22 AM      Result Value Range Status   MRSA, PCR NEGATIVE  NEGATIVE Final   Staphylococcus aureus NEGATIVE  NEGATIVE Final   Comment:            The Xpert SA Assay (FDA     approved for NASAL specimens     in patients over 12 years of age),     is one component of     a comprehensive surveillance     program.  Test performance has     been validated by [employer] for patients greater     than or equal to 68 year old.     It is not intended     to diagnose infection nor to     guide or monitor treatment.    RADIOLOGY STUDIES/RESULTS: Dg Elbow Complete Left  10/25/2012   *RADIOLOGY REPORT*  Clinical Data: Diffuse left elbow pain and swelling.  Surgery in April 2014.  Recent fall.  LEFT ELBOW - COMPLETE 3+ VIEW  Comparison: No prior elbow graft radiographs available for  comparison  Findings: The elbow is located.  Best seen in the sagittal projection is a subacute appearing fracture of the proximal ulna, near the level of the elbow joint.  There are postoperative changes of internal fixation of the proximal ulna, extending from the olecranon to the proximal diaphysis, including a cortical plate and multiple screws. The hardware appears intact.  No definite acute fracture is identified.  There is a joint effusion.  Diffuse subcutaneous soft tissue swelling is seen about the elbow.  There are tiny calcifications post in the soft tissues posterior to the elbow joint, of uncertain chronicity.  IMPRESSION:  1.  Elbow joint effusion and  diffuse soft tissue swelling posterior to the elbow. 2.  Subacute appearing fracture of the proximal ulna in this patient status post ORIF of the proximal ulna.   Original Report Authenticated By: Britta Mccreedy, M.D.    [facility]   If 7PM-7AM, please contact night-coverage [email or web address] Password [email or web address] 10/29/2012, 2:47 PM   LOS: 4 days

## 2012-10-29 NOTE — Op Note (Signed)
10/25/2012 - 10/29/2012  2:47 PM  PATIENT:  [name] [name]    PRE-OPERATIVE DIAGNOSIS:  septic Left elbow  POST-OPERATIVE DIAGNOSIS:  Same  PROCEDURE:  IRRIGATION AND DEBRIDEMENT EXTREMITY, wound vac change, stimulan beads, HARDWARE REMOVAL  SURGEON:  Shamia Uppal, D, MD  ASSISTANT: none  ANESTHESIA:   Gen  PREOPERATIVE INDICATIONS:  [name] [name] is a  52 y.o. male with a diagnosis of septic Left elbow who failed conservative measures and elected for surgical management.    The risks benefits and alternatives were discussed with the patient preoperatively including but not limited to the risks of infection, bleeding, nerve injury, cardiopulmonary complications, the need for revision surgery, among others, and the patient was willing to proceed.  OPERATIVE IMPLANTS: dissolvable antibiotic beeds  OPERATIVE FINDINGS: No additional purulence noted. Hardware was removed completely  OPERATIVE PROCEDURE:  Patient was identified in the preoperative holding area and site was marked by me He was transported to the operating theater and placed on the table in supine position taking care to pad all bony prominences. After a preincinduction time out anesthesia was induced. The left upper extremity was prepped and draped in normal sterile fashion and a pre-incision timeout was performed he received Ancef for preoperative antibiotics. Wound VAC was removed from the left upper extremity as was the intra-articular catheter thorough irrigation of all remaining necrotic tissue was performed as there was minimal to be found. There was no purulent fluid noted. The posterior olecranon plate was removed completely and came out intact. I used a curette to debride all drill holes as well as the site of the plate. I performed a thorough irrigation with 4 L of saline. Express fluid from the joint and irrigated the joint as well with no additional purulence. I then selected a small wound VAC in place to PDS  sutures to retain the skin from retracting. Wound VAC was placed in the wound and held suctioned well. Patient was then awoken and taken the PACU in stable condition in a splintplaced across the left elbow.   POST OPERATIVE PLAN: DVT prophylaxis will be continued by the hospitalist service is no orthopedic contraindication to chemical prophylaxis for he'll continue be nonweightbearing at the left elbow.

## 2012-10-29 NOTE — Transfer of Care (Signed)
Immediate Anesthesia Transfer of Care Note  Patient: [name] [name]  Procedure(s) Performed: Procedure(s) with comments: IRRIGATION AND DEBRIDEMENT EXTREMITY (Left) - lateral on bean bag  Patient Location: PACU  Anesthesia Type:General  Level of Consciousness: awake, alert , oriented and patient cooperative  Airway & Oxygen Therapy: Patient Spontanous Breathing and Patient connected to face mask oxygen  Post-op Assessment: Report given to PACU RN, Post -op Vital signs reviewed and stable and Patient moving all extremities X 4  Post vital signs: Reviewed and stable  Complications: No apparent anesthesia complications

## 2012-10-29 NOTE — Anesthesia Postprocedure Evaluation (Signed)
  Anesthesia Post-op Note  Patient: [name] [name]  Procedure(s) Performed: Procedure(s) with comments: IRRIGATION AND DEBRIDEMENT EXTREMITY (Left) - lateral on bean bag  Patient Location: PACU  Anesthesia Type:General  Level of Consciousness: awake, alert  and oriented  Airway and Oxygen Therapy: Patient Spontanous Breathing and Patient connected to nasal cannula oxygen  Post-op Pain: mild  Post-op Assessment: Post-op Vital signs reviewed  Post-op Vital Signs: Reviewed  Complications: No apparent anesthesia complications

## 2012-10-29 NOTE — Progress Notes (Signed)
Antibiotic Consult Note: Vancomycin Indication: Septic arthritis and osteomyelitis (MRSA)  Vancomycin trough = 8.4 on Vancomycin 1000 mg q8h. Goal vanc trough = 15-20  Will increase the dose to Vancomycin 1250 mg IV q8h to get closer to the desired trough.  Cardell Peach, PharmD

## 2012-10-29 NOTE — Anesthesia Preprocedure Evaluation (Addendum)
Anesthesia Evaluation  Patient identified by MRN, date of birth, ID band Patient awake    Reviewed: Allergy & Precautions, H&P , NPO status , reviewed documented beta blocker date and time   History of Anesthesia Complications Negative for: history of anesthetic complications  Airway Mallampati: II TM Distance: >3 FB Neck ROM: Full    Dental  (+) Missing, Partial Lower, Partial Upper and Dental Advisory Given   Pulmonary Current Smoker,  breath sounds clear to auscultation  Pulmonary exam normal       Cardiovascular hypertension, Pt. on home beta blockers Rhythm:Regular Rate:Normal     Neuro/Psych Seizures -, Poorly Controlled,  Chronic alcohol use.Cerebral aneurysm repair 2005    GI/Hepatic GERD-  Controlled and Medicated,(+) Hepatitis -, C  Endo/Other  negative endocrine ROS  Renal/GU negative Renal ROS     Musculoskeletal negative musculoskeletal ROS (+)   Abdominal Normal abdominal exam  (+)   Peds  Hematology negative hematology ROS (+)   Anesthesia Other Findings   Reproductive/Obstetrics                          Anesthesia Physical Anesthesia Plan  ASA: III  Anesthesia Plan: General   Post-op Pain Management:    Induction:   Airway Management Planned: LMA  Additional Equipment:   Intra-op Plan:   Post-operative Plan: Extubation in OR  Informed Consent: I have reviewed the patients History and Physical, chart, labs and discussed the procedure including the risks, benefits and alternatives for the proposed anesthesia with the patient or authorized representative who has indicated his/her understanding and acceptance.   Dental advisory given  Plan Discussed with: Anesthesiologist, CRNA and Surgeon  Anesthesia Plan Comments:        Anesthesia Quick Evaluation

## 2012-10-29 NOTE — ED Provider Notes (Signed)
Medical screening examination/treatment/procedure(s) were performed by non-physician practitioner and as supervising physician I was immediately available for consultation/collaboration.    Nelia Shi, MD 10/29/12 0830

## 2012-10-29 NOTE — Preoperative (Signed)
Beta Blockers   Reason not to administer Beta Blockers:Not Applicable Pt took am Metoprolol 100 mg 0838

## 2012-10-30 ENCOUNTER — Encounter (HOSPITAL_COMMUNITY): Payer: Self-pay | Admitting: Orthopedic Surgery

## 2012-10-30 LAB — BASIC METABOLIC PANEL
CO2: 26 mEq/L (ref 19–32)
Calcium: 8.7 mg/dL (ref 8.4–10.5)
Chloride: 94 mEq/L — ABNORMAL LOW (ref 96–112)
Creatinine, Ser: 0.61 mg/dL (ref 0.50–1.35)
Glucose, Bld: 105 mg/dL — ABNORMAL HIGH (ref 70–99)
Sodium: 130 mEq/L — ABNORMAL LOW (ref 135–145)

## 2012-10-30 LAB — CBC WITH DIFFERENTIAL/PLATELET
Eosinophils Relative: 0 % (ref 0–5)
HCT: 29.6 % — ABNORMAL LOW (ref 39.0–52.0)
Hemoglobin: 10.7 g/dL — ABNORMAL LOW (ref 13.0–17.0)
Lymphocytes Relative: 47 % — ABNORMAL HIGH (ref 12–46)
Lymphs Abs: 2.9 10*3/uL (ref 0.7–4.0)
MCV: 97.4 fL (ref 78.0–100.0)
Monocytes Absolute: 0.7 10*3/uL (ref 0.1–1.0)
Neutro Abs: 2.5 10*3/uL (ref 1.7–7.7)
RBC: 3.04 MIL/uL — ABNORMAL LOW (ref 4.22–5.81)
WBC: 6.1 10*3/uL (ref 4.0–10.5)

## 2012-10-30 LAB — CARBAMAZEPINE LEVEL, TOTAL: Carbamazepine Lvl: 11.1 ug/mL (ref 4.0–12.0)

## 2012-10-30 MED ORDER — BISACODYL 5 MG PO TBEC
10.0000 mg | DELAYED_RELEASE_TABLET | Freq: Two times a day (BID) | ORAL | Status: AC
  Administered 2012-10-30 (×2): 10 mg via ORAL
  Filled 2012-10-30: qty 1
  Filled 2012-10-30: qty 2

## 2012-10-30 NOTE — Anesthesia Postprocedure Evaluation (Signed)
  Anesthesia Post-op Note  Patient: [name] [name]  Procedure(s) Performed: Procedure(s): IRRIGATION AND DEBRIDEMENT Left Elbow (Left)  Patient Location: Nursing Unit  Anesthesia Type:General  Level of Consciousness: awake, alert  and oriented  Airway and Oxygen Therapy: Patient Spontanous Breathing  Post-op Pain: none  Post-op Assessment: Post-op Vital signs reviewed  Post-op Vital Signs: stable  Complications: No apparent anesthesia complications

## 2012-10-30 NOTE — Progress Notes (Signed)
Orthopedic Tech Progress Note Patient Details:  [name] [name] 07/04/1960 [id number] Spoke with ordering doctor; wants posterior long arm splint applied to elbow. Told to work around wound vac. Wound vac area left open for access while patient is in splint. Application tolerated well.  Ortho Devices Type of Ortho Device: Arm sling;Post (long arm) splint;Ace wrap Ortho Device/Splint Location: Left UE Ortho Device/Splint Interventions: Application   Asia R Thompson 10/30/2012, 11:20 AM

## 2012-10-30 NOTE — Progress Notes (Signed)
Pt.refused to have sling placed on ,tried to explain to him the importance of the sling & he finally allowed [place] to put in on.

## 2012-10-30 NOTE — Progress Notes (Signed)
[facility]   Day # 5 vancomycin  Subjective: C/o various things including pain meds    Antibiotics:  Anti-infectives   Start     Dose/Rate Route Frequency Ordered Stop   10/30/12 0600  vancomycin (VANCOCIN) 1,250 mg in sodium chloride 0.9 % 250 mL IVPB     1,250 mg 166.7 mL/hr over 90 Minutes Intravenous Every 8 hours 10/29/12 2251     10/29/12 1105  gentamicin (GARAMYCIN) injection  Status:  Discontinued       As needed 10/29/12 1105 10/29/12 1126   10/29/12 1102  vancomycin (VANCOCIN) powder  Status:  Discontinued       As needed 10/29/12 1103 10/29/12 1126   10/27/12 1300  vancomycin (VANCOCIN) IVPB 1000 mg/200 mL premix     1,000 mg 200 mL/hr over 60 Minutes Intravenous Every 8 hours 10/27/12 1157 10/29/12 2346   10/27/12 0000  Tobramycin 40 mg in 0.9% NS 80 mL (0.5 mg/mL concentration)  Status:  Discontinued     20 mL/hr over 48 Hours Irrigation Every 8 hours 10/26/12 2300 10/29/12 1543   10/26/12 2200  tobramycin (NEBCIN) injection 40 mg  Status:  Discontinued     40 mg Intramuscular Every 8 hours 10/26/12 1637 10/26/12 2248   10/26/12 1445  sodium chloride 0.9 % 80 mL with tobramycin (NEBCIN) 40 mg infusion  Status:  Discontinued     10-20 mL/hr  Intravenous To Surgery 10/26/12 1436 10/26/12 1651   10/26/12 1442  tobramycin (NEBCIN) injection  Status:  Discontinued       As needed 10/26/12 1451 10/26/12 1527   10/26/12 0000  Tobramycin 40 mg in 0.9% NS 80 mL (0.5 mg/mL concentration)  Status:  Discontinued     20 mL/hr  Irrigation Every 8 hours 10/26/12 2248 10/26/12 2300   10/25/12 2000  vancomycin (VANCOCIN) IVPB 1000 mg/200 mL premix     1,000 mg 200 mL/hr over 60 Minutes Intravenous  Once 10/25/12 1853 10/25/12 2312   10/25/12 1930  piperacillin-tazobactam (ZOSYN) IVPB 3.375 g     3.375 g 100 mL/hr over 30 Minutes Intravenous  Once 10/25/12 1853 10/25/12 2114   10/25/12 1800  tobramycin (NEBCIN) injection 40 mg  Status:  Discontinued      40 mg Intramuscular  Once 10/25/12 1758 10/26/12 1636      Medications: Scheduled Meds: . bisacodyl  10 mg Oral BID  . carbamazepine  400 mg Oral TID  . docusate sodium  200 mg Oral BID  . folic acid  1 mg Oral Daily  . heparin  5,000 Units Subcutaneous Q8H  . metoprolol  100 mg Oral BID  . pantoprazole  40 mg Oral Daily  . thiamine  100 mg Oral Daily  . vancomycin  1,250 mg Intravenous Q8H   Continuous Infusions:  PRN Meds:.HYDROmorphone (DILAUDID) injection, morphine injection, oxyCODONE   Objective: Weight change:   Intake/Output Summary (Last 24 hours) at 10/30/12 2100 Last data filed at 10/30/12 1300  Gross per 24 hour  Intake    848 ml  Output   1625 ml  Net   -777 ml   Blood pressure 124/70, pulse 75, temperature 98.6 F (37 C), temperature source Oral, resp. rate 20, height 6\' 6"  (1.981 m), weight 180 lb 8.9 oz (81.9 kg), SpO2 93.00%. Temp:  [98.6 F (37 C)-99.3 F (37.4 C)] 98.6 F (37 C) (08/12 1425) Pulse Rate:  [68-79] 75 (08/12 1425) Resp:  [16-20] 20 (08/12 1425) BP: (124-152)/(70-87)  124/70 mmHg (08/12 1425) SpO2:  [93 %-99 %] 93 % (08/12 1425)  Physical Exam: General: Alert and awake, communication still  Not easy today. He is against going to [facility]  HEENT: anicteric sclera, pupils reactive to light and accommodation, EOMI, oropharynx clear and without exudate  CVS regular rate, normal r, no murmur rubs or gallops  Chest: clear to auscultation bilaterally, no wheezing, rales or rhonchi  Abdomen: soft nontender, nondistended, normal bowel sounds,  Extremities left elbow in sling and bandaged  Skin: no rashes  Neuro: nonfocal, strength and sensation intact, confused   Lab Results:  Recent Labs  10/28/12 0610 10/30/12 0815  WBC 5.6 6.1  HGB 11.0* 10.7*  HCT 30.6* 29.6*  PLT 83* 122*    BMET  Recent Labs  10/28/12 0610 10/30/12 0815  NA 134* 130*  K 3.4* 3.7  CL 97 94*  CO2 27 26  GLUCOSE 119* 105*  BUN 6 9  CREATININE 0.55  0.61  CALCIUM 8.5 8.7    Micro Results: Recent Results (from the past 240 hour(s))  BODY FLUID CULTURE     Status: None   Collection Time    10/25/12  2:04 PM      Result Value Range Status   Specimen Description SYNOVIAL JOINT,ELBOW   Final   Special Requests Normal   Final   Gram Stain     Final   Value: ABUNDANT WBC PRESENT, PREDOMINANTLY PMN     NO ORGANISMS SEEN     Gram Stain Report Called to,Read Back By and Verified With: Gram Stain Report Called to,Read Back By and Verified With: S WEST 1650 10/25/12 BY MURPHYD Performed by [facility]     Performed at [facility]   Culture     Final   Value: FEW METHICILLIN RESISTANT STAPHYLOCOCCUS AUREUS     Note: RIFAMPIN AND GENTAMICIN SHOULD NOT BE USED AS SINGLE DRUGS FOR TREATMENT OF STAPH INFECTIONS. This organism DOES NOT demonstrate inducible Clindamycin resistance in vitro.     Note: CRITICAL RESULT CALLED TO, READ BACK BY AND VERIFIED WITH: ROBIN ZONER 10/27/12 @ 4;58PM BY RUSCOE A. CRITICAL RESULT CALLED TO, READ BACK BY AND VERIFIED WITH: DORA GARDNER 10/27/12 @ 7:14PM BY RUSCOE A.     Performed at [facility]   Report Status 10/28/2012 FINAL   Final   Organism ID, Bacteria METHICILLIN RESISTANT STAPHYLOCOCCUS AUREUS   Final  GRAM STAIN     Status: None   Collection Time    10/25/12  2:04 PM      Result Value Range Status   Specimen Description SYNOVIAL JOINT,ELBOW   Final   Special Requests Normal   Final   Gram Stain     Final   Value: WBC PRESENT, PREDOMINANTLY PMN     NO ORGANISMS SEEN     Gram Stain Report Called to,Read Back By and Verified With: S.WEST/[id number]/[email or web address]     CYTOSPIN   Report Status 10/25/2012 FINAL   Final  SURGICAL PCR SCREEN     Status: None   Collection Time    10/26/12  7:22 AM      Result Value Range Status   MRSA, PCR NEGATIVE  NEGATIVE Final   Staphylococcus aureus NEGATIVE  NEGATIVE Final   Comment:            The Xpert SA Assay (FDA     approved for NASAL  specimens     in patients over 8 years of age),  is one component of     a comprehensive surveillance     program.  Test performance has     been validated by [facility] for patients greater     than or equal to 62 year old.     It is not intended     to diagnose infection nor to     guide or monitor treatment.    Studies/Results: Dg Elbow 2 Views Left  10/29/2012   *RADIOLOGY REPORT*  Clinical Data: Postop hardware removal  LEFT ELBOW - 2 VIEW  Comparison: 10/29/2012, 10/25/2012  Findings: Olecrenon fixation hardware has been removed.  Olecrenon fracture remains present with normal alignment.  Soft tissue swelling posteriorly.  Joint effusion noted.  Operative site radiopaque antibiotic beads present. Wound vac in place posteriorly.  IMPRESSION: Removal of the olecranon hardware.  Olecrenon fracture again evident.  Stable alignment.   Original Report Authenticated By: Judie Petit. Miles Costain, M.D.   Dg Elbow 2 Views Left  10/29/2012   *RADIOLOGY REPORT*  Clinical Data: Infected nonunion of olecranon.  LEFT ELBOW - 2 VIEW  Comparison: 10/25/2012.  Findings: Interval application of a wound vac.  Extensive soft tissue swelling has worsened, particularly ventral to the elbow. Elbow joint effusion again seen.  Olecranon fracture with sclerotic margins, consistent with nonunion.  Redemonstrated plate and screw fixation hardware, with lucencies around the majority of the screws, particularly near the bone- plate margin. There is a notable lucency along the mid portion of the longer olecranon screw.  No evidence of hardware fracture.  No acute osseous fracture.  IMPRESSION:  1.  Increasing periarticular soft tissue swelling. 2.  Nonunited olecranon fracture.  There is lucency around multiple fixation screws, suggesting osseous infection. 3.  Elbow joint effusion.   Original Report Authenticated By: Tiburcio Pea      Assessment/Plan: [name] [name] is a 52 y.o. 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 He is at VERY high risk for progression of his cirrhosis with untreated hep c, etohism, AA race  I will ask my clinic manager to arrange HSFU in the next 3 weeks.  I will otherwise sign off, please call with further questions.    LOS: 5 days   Acey Lav 10/30/2012, 9:00 PM

## 2012-10-30 NOTE — Progress Notes (Signed)
Subjective:  Patient reports pain as mild.    Objective:   VITALS:   Filed Vitals:   10/29/12 1448 10/29/12 2229 10/30/12 0515 10/30/12 1425  BP: 133/82 134/80 152/87 124/70  Pulse: 77 79 68 75  Temp: 98.5 F (36.9 C) 98.9 F (37.2 C) 99.3 F (37.4 C) 98.6 F (37 C)  TempSrc: Oral Oral Oral Oral  Resp: 20 16 16 20   Height:      Weight:      SpO2: 97% 95% 99% 93%    LUE:  Dressing: C/D/I  Compartments soft  SILT M/R/U, 2+rad puls, +EPL/FPL/IO   LABS  Results for orders placed during the hospital encounter of 10/25/12 (from the past 24 hour(s))  CBC WITH DIFFERENTIAL     Status: Abnormal   Collection Time    10/30/12  8:15 AM      Result Value Range   WBC 6.1  4.0 - 10.5 K/uL   RBC 3.04 (*) 4.22 - 5.81 MIL/uL   Hemoglobin 10.7 (*) 13.0 - 17.0 g/dL   HCT 16.1 (*) 09.6 - 04.5 %   MCV 97.4  78.0 - 100.0 fL   MCH 35.2 (*) 26.0 - 34.0 pg   MCHC 36.1 (*) 30.0 - 36.0 g/dL   RDW 40.9  81.1 - 91.4 %   Platelets 122 (*) 150 - 400 K/uL   Neutrophils Relative % 41 (*) 43 - 77 %   Neutro Abs 2.5  1.7 - 7.7 K/uL   Lymphocytes Relative 47 (*) 12 - 46 %   Lymphs Abs 2.9  0.7 - 4.0 K/uL   Monocytes Relative 12  3 - 12 %   Monocytes Absolute 0.7  0.1 - 1.0 K/uL   Eosinophils Relative 0  0 - 5 %   Eosinophils Absolute 0.0  0.0 - 0.7 K/uL   Basophils Relative 0  0 - 1 %   Basophils Absolute 0.0  0.0 - 0.1 K/uL  BASIC METABOLIC PANEL     Status: Abnormal   Collection Time    10/30/12  8:15 AM      Result Value Range   Sodium 130 (*) 135 - 145 mEq/L   Potassium 3.7  3.5 - 5.1 mEq/L   Chloride 94 (*) 96 - 112 mEq/L   CO2 26  19 - 32 mEq/L   Glucose, Bld 105 (*) 70 - 99 mg/dL   BUN 9  6 - 23 mg/dL   Creatinine, Ser 7.82  0.50 - 1.35 mg/dL   Calcium 8.7  8.4 - 95.6 mg/dL   GFR calc non Af Amer >90  >90 mL/min   GFR calc Af Amer >90  >90 mL/min  CARBAMAZEPINE LEVEL, TOTAL     Status: None   Collection Time    10/30/12  2:34 PM      Result Value Range   Carbamazepine  Lvl 11.1  4.0 - 12.0 ug/mL     Assessment/Plan: 1 Day Post-Op   Principal Problem:   Septic joint of left elbow Active Problems:   History of alcohol abuse   Chronic liver disease   Thrombocytopenia   Hepatitis C   PLAN: Plan for repeat I&D and wound closure on Friday at 7:15 NWB LUE Please hold VTE px on Friday mornig VTE prophylaxis: SCD, Heparin  DISPO: will need pic line and long term Abx. I have arranged for patient to follow up a [facility] for definitive treatment of his septic nonunion. He should follow up  there as soon as possible.   Margarita Rana, D 10/30/2012, 9:54 PM   Margarita Rana, MD Cell [phone]

## 2012-10-30 NOTE — Progress Notes (Addendum)
PATIENT DETAILS Name: [name] [name] Age: 52 y.o. Sex: male Date of Birth: [birth year] Admit Date: 10/25/2012 Admitting Physician Doree Albee, MD PCP:No primary provider on file.  [name] [name] is a 52 yo male with significant past medical history of alcohol abuse, seizures, and brain aneurysm who presented with left elbow septic joint. Patient was in jail 3 months ago in [facility] where he fell on left elbow and had multiple fractures. In April 2014 patient had ORIF of proximal ulna and has had progressive swelling, pain, and redness since surgery. Recently, patient was off seizure medications and had an unwitnessed seizure on 8/6 when he may have hit his left elbow. Upon admission to [facility] ER, xray of left elbow demonstrated soft tissue swelling and fluid drained from elbow contained >170k WBCs, predominant neutrophils, gram stain was negative. Dr. [name] was contacted about case, recommended intraarticular tobramycin injection by ER and patient transferred to [facility] for OR and medical admission.  Subjective: Low-grade fever (99.3) this morning.  Overall pain is less and fever is down.  Dr. [name] did another debridement with hardware removal yesterday (8/11)  Assessment/Plan:  MRSA Septic arthritis, Left elbow  -H/o ORIF of proximal ulna, MRSA septic arthritis  -Joint aspiration with intraarticular tobramycin injection on 8/7  -Joint aspiration fluid showed >170k WBCs, primarily neutrophils, gram stain was negative.  -On IV vancomycin, with tobramycin for joint space irrigation.  -Infectious Disease is following to provide guidance for antibiotic coverage. -Debridement performed 8/11 with olecranon hardware removal and antibiotic beads placed by Dr. [name] -Has complex situation with PICC line in his right hand, left hand in splint and IV antibiotics.? Needs LTAC or SNF at D/C for long term antibiotics.   Seizure  -Continue carbamazepine at 400 mg TID -Carbamazepine levels  checked 8/7, low at <0.5.  Will recheck a Total Tegretol level on 8/13. -Monitor for seizure activity   Hypertension  -Stable  -Continue metoprolol   EtOH abuse  -CIWA evaluation did not show withdrawal symptoms  -Ammonia level 8/7, upper limits of normal  -Continue IV thiamine and folate   Probable Chronic Liver Disease  -Likely cirrhosis secondary to Hep C and history of alcoholism  -Avoid hepatotoxic medications, d/c percocet and start oxycodone without acetaminophen  -Continue to monitor CMP  -Normal INR  Disposition: Remain inpatient.  Will need LTAC vs SNF at D/C for PICC care and long term antibiotics.  DVT Prophylaxis: Prophylactic Heparin   Code Status: Full code  Family Communication None at bedside  Procedures:  None  CONSULTS:  ID and orthopedic surgery   MEDICATIONS: Scheduled Meds: . bisacodyl  10 mg Oral BID  . carbamazepine  400 mg Oral TID  . docusate sodium  200 mg Oral BID  . folic acid  1 mg Oral Daily  . heparin  5,000 Units Subcutaneous Q8H  . metoprolol  100 mg Oral BID  . pantoprazole  40 mg Oral Daily  . thiamine  100 mg Oral Daily  . vancomycin  1,250 mg Intravenous Q8H   Continuous Infusions:  PRN Meds:.HYDROmorphone (DILAUDID) injection, morphine injection, oxyCODONE  Antibiotics: Anti-infectives   Start     Dose/Rate Route Frequency Ordered Stop   10/30/12 0600  vancomycin (VANCOCIN) 1,250 mg in sodium chloride 0.9 % 250 mL IVPB     1,250 mg 166.7 mL/hr over 90 Minutes Intravenous Every 8 hours 10/29/12 2251     10/29/12 1105  gentamicin (GARAMYCIN) injection  Status:  Discontinued       As  needed 10/29/12 1105 10/29/12 1126   10/29/12 1102  vancomycin (VANCOCIN) powder  Status:  Discontinued       As needed 10/29/12 1103 10/29/12 1126   10/27/12 1300  vancomycin (VANCOCIN) IVPB 1000 mg/200 mL premix     1,000 mg 200 mL/hr over 60 Minutes Intravenous Every 8 hours 10/27/12 1157 10/29/12 2346   10/27/12 0000  Tobramycin 40  mg in 0.9% NS 80 mL (0.5 mg/mL concentration)  Status:  Discontinued     20 mL/hr over 48 Hours Irrigation Every 8 hours 10/26/12 2300 10/29/12 1543   10/26/12 2200  tobramycin (NEBCIN) injection 40 mg  Status:  Discontinued     40 mg Intramuscular Every 8 hours 10/26/12 1637 10/26/12 2248   10/26/12 1445  sodium chloride 0.9 % 80 mL with tobramycin (NEBCIN) 40 mg infusion  Status:  Discontinued     10-20 mL/hr  Intravenous To Surgery 10/26/12 1436 10/26/12 1651   10/26/12 1442  tobramycin (NEBCIN) injection  Status:  Discontinued       As needed 10/26/12 1451 10/26/12 1527   10/26/12 0000  Tobramycin 40 mg in 0.9% NS 80 mL (0.5 mg/mL concentration)  Status:  Discontinued     20 mL/hr  Irrigation Every 8 hours 10/26/12 2248 10/26/12 2300   10/25/12 2000  vancomycin (VANCOCIN) IVPB 1000 mg/200 mL premix     1,000 mg 200 mL/hr over 60 Minutes Intravenous  Once 10/25/12 1853 10/25/12 2312   10/25/12 1930  piperacillin-tazobactam (ZOSYN) IVPB 3.375 g     3.375 g 100 mL/hr over 30 Minutes Intravenous  Once 10/25/12 1853 10/25/12 2114   10/25/12 1800  tobramycin (NEBCIN) injection 40 mg  Status:  Discontinued     40 mg Intramuscular  Once 10/25/12 1758 10/26/12 1636       PHYSICAL EXAM: Vital signs in last 24 hours: Filed Vitals:   10/29/12 1247 10/29/12 1448 10/29/12 2229 10/30/12 0515  BP:  133/82 134/80 152/87  Pulse:  77 79 68  Temp: 98.7 F (37.1 C) 98.5 F (36.9 C) 98.9 F (37.2 C) 99.3 F (37.4 C)  TempSrc:  Oral Oral Oral  Resp:  20 16 16   Height:      Weight:      SpO2:  97% 95% 99%    Weight change:  Filed Weights   10/25/12 2202  Weight: 81.9 kg (180 lb 8.9 oz)   Body mass index is 20.87 kg/(m^2).   Gen Exam: Awake and alert with clear speech.   Neck: Supple, No JVD.   Chest: B/L Clear.   CVS: S1 S2 Regular, no murmurs.  Abdomen: Soft, BS +, non tender, non distended.  Extremities: No edema, lower extremities warm to touch. Left elbow in posterior long arm  splint with wound vac placement. Neurologic: Non Focal.   Skin: No Rash.      Intake/Output from previous day:  Intake/Output Summary (Last 24 hours) at 10/30/12 1229 Last data filed at 10/30/12 0900  Gross per 24 hour  Intake   1190 ml  Output   2590 ml  Net  -1400 ml     LAB RESULTS: CBC  Recent Labs Lab 10/25/12 1416 10/25/12 1424 10/26/12 0454 10/27/12 0516 10/28/12 0610 10/30/12 0815  WBC 7.5  --  7.7 7.1 5.6 6.1  HGB 13.3 14.6 12.4* 10.6* 11.0* 10.7*  HCT 38.0* 43.0 34.4* 29.7* 30.6* 29.6*  PLT 85*  --  64* 61* 83* 122*  MCV 98.7  --  97.7 99.7 98.4  97.4  MCH 34.5*  --  35.2* 35.6* 35.4* 35.2*  MCHC 35.0  --  36.0 35.7 35.9 36.1*  RDW 12.7  --  12.7 13.0 12.7 12.8  LYMPHSABS 1.9  --  2.3 2.8 2.4 2.9  MONOABS 0.8  --  1.0 0.9 0.7 0.7  EOSABS 0.0  --  0.0 0.0 0.0 0.0  BASOSABS 0.0  --  0.0 0.0 0.0 0.0    Chemistries   Recent Labs Lab 10/25/12 1424 10/26/12 0454 10/27/12 0516 10/28/12 0610 10/30/12 0815  NA 135 133* 133* 134* 130*  K 3.8 3.7 3.5 3.4* 3.7  CL 98 98 99 97 94*  CO2  --  26 25 27 26   GLUCOSE 131* 127* 114* 119* 105*  BUN 6 7 7 6 9   CREATININE 0.70 0.59 0.59 0.55 0.61  CALCIUM  --  8.8 8.0* 8.5 8.7    Coagulation profile  Recent Labs Lab 10/27/12 0516  INR 0.99    MICROBIOLOGY: Recent Results (from the past 240 hour(s))  BODY FLUID CULTURE     Status: None   Collection Time    10/25/12  2:04 PM      Result Value Range Status   Specimen Description SYNOVIAL JOINT,ELBOW   Final   Special Requests Normal   Final   Gram Stain     Final   Value: ABUNDANT WBC PRESENT, PREDOMINANTLY PMN     NO ORGANISMS SEEN     Gram Stain Report Called to,Read Back By and Verified With: Gram Stain Report Called to,Read Back By and Verified With: S WEST 1650 10/25/12 BY MURPHYD Performed by [facility]     Performed at [facility]   Culture     Final   Value: FEW METHICILLIN RESISTANT STAPHYLOCOCCUS AUREUS     Note: RIFAMPIN  AND GENTAMICIN SHOULD NOT BE USED AS SINGLE DRUGS FOR TREATMENT OF STAPH INFECTIONS. This organism DOES NOT demonstrate inducible Clindamycin resistance in vitro.     Note: CRITICAL RESULT CALLED TO, READ BACK BY AND VERIFIED WITH: ROBIN ZONER 10/27/12 @ 4;58PM BY RUSCOE A. CRITICAL RESULT CALLED TO, READ BACK BY AND VERIFIED WITH: DORA GARDNER 10/27/12 @ 7:14PM BY RUSCOE A.     Performed at [facility]   Report Status 10/28/2012 FINAL   Final   Organism ID, Bacteria METHICILLIN RESISTANT STAPHYLOCOCCUS AUREUS   Final  GRAM STAIN     Status: None   Collection Time    10/25/12  2:04 PM      Result Value Range Status   Specimen Description SYNOVIAL JOINT,ELBOW   Final   Special Requests Normal   Final   Gram Stain     Final   Value: WBC PRESENT, PREDOMINANTLY PMN     NO ORGANISMS SEEN     Gram Stain Report Called to,Read Back By and Verified With: S.WEST/[id number]/MURPHYD     CYTOSPIN   Report Status 10/25/2012 FINAL   Final  SURGICAL PCR SCREEN     Status: None   Collection Time    10/26/12  7:22 AM      Result Value Range Status   MRSA, PCR NEGATIVE  NEGATIVE Final   Staphylococcus aureus NEGATIVE  NEGATIVE Final   Comment:            The Xpert SA Assay (FDA     approved for NASAL specimens     in patients over 79 years of age),     is one component of  a comprehensive surveillance     program.  Test performance has     been validated by [facility] for patients greater     than or equal to 1 year old.     It is not intended     to diagnose infection nor to     guide or monitor treatment.    RADIOLOGY STUDIES/RESULTS: Dg Elbow 2 Views Left  10/29/2012   *RADIOLOGY REPORT*  Clinical Data: Postop hardware removal  LEFT ELBOW - 2 VIEW  Comparison: 10/29/2012, 10/25/2012  Findings: Olecrenon fixation hardware has been removed.  Olecrenon fracture remains present with normal alignment.  Soft tissue swelling posteriorly.  Joint effusion noted.  Operative site  radiopaque antibiotic beads present. Wound vac in place posteriorly.  IMPRESSION: Removal of the olecranon hardware.  Olecrenon fracture again evident.  Stable alignment.   Original Report Authenticated By: Judie Petit. Miles Costain, M.D.   Dg Elbow 2 Views Left  10/29/2012   *RADIOLOGY REPORT*  Clinical Data: Infected nonunion of olecranon.  LEFT ELBOW - 2 VIEW  Comparison: 10/25/2012.  Findings: Interval application of a wound vac.  Extensive soft tissue swelling has worsened, particularly ventral to the elbow. Elbow joint effusion again seen.  Olecranon fracture with sclerotic margins, consistent with nonunion.  Redemonstrated plate and screw fixation hardware, with lucencies around the majority of the screws, particularly near the bone- plate margin. There is a notable lucency along the mid portion of the longer olecranon screw.  No evidence of hardware fracture.  No acute osseous fracture.  IMPRESSION:  1.  Increasing periarticular soft tissue swelling. 2.  Nonunited olecranon fracture.  There is lucency around multiple fixation screws, suggesting osseous infection. 3.  Elbow joint effusion.   Original Report Authenticated By: Tiburcio Pea   Dg Elbow Complete Left  10/25/2012   *RADIOLOGY REPORT*  Clinical Data: Diffuse left elbow pain and swelling.  Surgery in April 2014.  Recent fall.  LEFT ELBOW - COMPLETE 3+ VIEW  Comparison: No prior elbow graft radiographs available for comparison  Findings: The elbow is located.  Best seen in the sagittal projection is a subacute appearing fracture of the proximal ulna, near the level of the elbow joint.  There are postoperative changes of internal fixation of the proximal ulna, extending from the olecranon to the proximal diaphysis, including a cortical plate and multiple screws. The hardware appears intact.  No definite acute fracture is identified.  There is a joint effusion.  Diffuse subcutaneous soft tissue swelling is seen about the elbow.  There are tiny calcifications post  in the soft tissues posterior to the elbow joint, of uncertain chronicity.  IMPRESSION:  1.  Elbow joint effusion and diffuse soft tissue swelling posterior to the elbow. 2.  Subacute appearing fracture of the proximal ulna in this patient status post ORIF of the proximal ulna.   Original Report Authenticated By: Britta Mccreedy, M.D.    Michae Kava, PA-S  [place], [place]  [facility] [phone]   If 7PM-7AM, please contact night-coverage [email or web address] Password [facility] 10/30/2012, 12:29 PM   LOS: 5 days    Addendum  Patient seen and examined, chart and data base reviewed.  I agree with the above assessment and plan.  For full details please see Mrs. Algis Downs PA note.  The above-noted reviewed and addended as needed.   Clint Lipps, MD [facility] Pager: [phone] 10/30/2012, 1:01 PM

## 2012-10-30 NOTE — Clinical Social Work Note (Signed)
CSW met with patient to get more information about his court date. Patient does not know when his court date is, and is unable to provide the information CSW needs (i.e. [place] court is in, type of court etc.). Patient became irritated by this, and CSW explained that this information would be needed to create and fax over letter to the appropriate court people.  [name] [name], [name], [name], [phone]

## 2012-10-31 DIAGNOSIS — R569 Unspecified convulsions: Secondary | ICD-10-CM | POA: Diagnosis present

## 2012-10-31 DIAGNOSIS — I10 Essential (primary) hypertension: Secondary | ICD-10-CM | POA: Diagnosis present

## 2012-10-31 LAB — CBC WITH DIFFERENTIAL/PLATELET
Basophils Absolute: 0 10*3/uL (ref 0.0–0.1)
Eosinophils Relative: 1 % (ref 0–5)
HCT: 31.8 % — ABNORMAL LOW (ref 39.0–52.0)
Hemoglobin: 11.3 g/dL — ABNORMAL LOW (ref 13.0–17.0)
Lymphocytes Relative: 30 % (ref 12–46)
MCV: 99.4 fL (ref 78.0–100.0)
Monocytes Absolute: 0.8 10*3/uL (ref 0.1–1.0)
Monocytes Relative: 13 % — ABNORMAL HIGH (ref 3–12)
Neutro Abs: 3.6 10*3/uL (ref 1.7–7.7)
RDW: 13.2 % (ref 11.5–15.5)
WBC: 6.4 10*3/uL (ref 4.0–10.5)

## 2012-10-31 MED ORDER — OXYCODONE HCL 5 MG PO TABS
5.0000 mg | ORAL_TABLET | ORAL | Status: DC | PRN
  Administered 2012-10-31 – 2012-11-02 (×6): 10 mg via ORAL
  Administered 2012-11-02 (×2): 5 mg via ORAL
  Filled 2012-10-31 (×5): qty 2
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
## 2012-10-31 NOTE — Progress Notes (Signed)
Patient seen and examined. Agree with note by Remigio Eisenmenger, PA. Here with MRSA septic arthritis of the left elbow. Will need prolonged course of IV vanc (6-8weeks) likely followed by PO antibiotics. Appreciate ID assistance. Ortho planning on repeat I and D later this week. Will continue to follow.  Peggye Pitt, MD [facility] Pager: [phone]

## 2012-11-01 ENCOUNTER — Encounter (HOSPITAL_COMMUNITY): Payer: Self-pay | Admitting: Anesthesiology

## 2012-11-01 DIAGNOSIS — E871 Hypo-osmolality and hyponatremia: Secondary | ICD-10-CM | POA: Diagnosis not present

## 2012-11-01 LAB — CBC WITH DIFFERENTIAL/PLATELET
Basophils Absolute: 0 10*3/uL (ref 0.0–0.1)
Basophils Relative: 0 % (ref 0–1)
Eosinophils Absolute: 0.1 10*3/uL (ref 0.0–0.7)
Eosinophils Relative: 1 % (ref 0–5)
HCT: 30.2 % — ABNORMAL LOW (ref 39.0–52.0)
Hemoglobin: 11 g/dL — ABNORMAL LOW (ref 13.0–17.0)
MCH: 35.5 pg — ABNORMAL HIGH (ref 26.0–34.0)
MCHC: 36.4 g/dL — ABNORMAL HIGH (ref 30.0–36.0)
MCV: 97.4 fL (ref 78.0–100.0)
Monocytes Absolute: 1 10*3/uL (ref 0.1–1.0)
Monocytes Relative: 14 % — ABNORMAL HIGH (ref 3–12)
RDW: 12.8 % (ref 11.5–15.5)

## 2012-11-01 LAB — BASIC METABOLIC PANEL
BUN: 6 mg/dL (ref 6–23)
Calcium: 8.9 mg/dL (ref 8.4–10.5)
GFR calc Af Amer: >90 mL/min (ref >90)
GFR calc non Af Amer: >90 mL/min (ref >90)
Potassium: 4.1 mEq/L (ref 3.5–5.1)
Sodium: 129 mEq/L — ABNORMAL LOW (ref 135–145)

## 2012-11-01 MED ORDER — TRAZODONE HCL 50 MG PO TABS
50.0000 mg | ORAL_TABLET | Freq: Every day | ORAL | Status: DC
  Administered 2012-11-02: 50 mg via ORAL
  Filled 2012-11-01 (×2): qty 1

## 2012-11-01 MED ORDER — SODIUM CHLORIDE 0.9 % IV SOLN
1750.0000 mg | Freq: Three times a day (TID) | INTRAVENOUS | Status: DC
  Administered 2012-11-01 – 2012-11-02 (×4): 1750 mg via INTRAVENOUS
  Filled 2012-11-01 (×6): qty 1750

## 2012-11-01 MED ORDER — HYDROMORPHONE HCL PF 1 MG/ML IJ SOLN
1.0000 mg | INTRAMUSCULAR | Status: DC | PRN
  Administered 2012-11-01 – 2012-11-02 (×3): 1 mg via INTRAVENOUS
  Filled 2012-11-01 (×4): qty 1

## 2012-11-01 NOTE — Progress Notes (Signed)
ANTIBIOTIC CONSULT NOTE - FOLLOW UP  Pharmacy Consult for vancomycin Indication: septic arthritis  No Known Allergies  Patient Measurements: Height: 6\' 6"  (198.1 cm) Weight: 180 lb 8.9 oz (81.9 kg) IBW/kg (Calculated) : 91.4   Vital Signs: Temp: 98.7 F (37.1 C) (08/14 0522) Temp src: Oral (08/14 0522) BP: 137/80 mmHg (08/14 0522) Pulse Rate: 65 (08/14 0522) Intake/Output from previous day: 08/13 0701 - 08/14 0700 In: 2870 [P.O.:2870] Out: 2525 [Urine:2275; Drains:250] Intake/Output from this shift:    Labs:  Recent Labs  10/30/12 0815 10/31/12 1416 11/01/12 0640  WBC 6.1 6.4 6.9  HGB 10.7* 11.3* 11.0*  PLT 122* 162 180  CREATININE 0.61  --   --    Estimated Creatinine Clearance: 123.7 ml/min (by C-G formula based on Cr of 0.61).  Recent Labs  10/29/12 2110 11/01/12 0640  VANCOTROUGH 8.4* 10.6     Microbiology: Recent Results (from the past 720 hour(s))  BODY FLUID CULTURE     Status: None   Collection Time    10/25/12  2:04 PM      Result Value Range Status   Specimen Description SYNOVIAL JOINT,ELBOW   Final   Special Requests Normal   Final   Gram Stain     Final   Value: ABUNDANT WBC PRESENT, PREDOMINANTLY PMN     NO ORGANISMS SEEN     Gram Stain Report Called to,Read Back By and Verified With: Gram Stain Report Called to,Read Back By and Verified With: S WEST 1650 10/25/12 BY MURPHYD Performed by [facility]     Performed at [facility]   Culture     Final   Value: FEW METHICILLIN RESISTANT STAPHYLOCOCCUS AUREUS     Note: RIFAMPIN AND GENTAMICIN SHOULD NOT BE USED AS SINGLE DRUGS FOR TREATMENT OF STAPH INFECTIONS. This organism DOES NOT demonstrate inducible Clindamycin resistance in vitro.     Note: CRITICAL RESULT CALLED TO, READ BACK BY AND VERIFIED WITH: ROBIN ZONER 10/27/12 @ 4;58PM BY RUSCOE A. CRITICAL RESULT CALLED TO, READ BACK BY AND VERIFIED WITH: DORA GARDNER 10/27/12 @ 7:14PM BY RUSCOE A.     Performed at [facility]   Report Status 10/28/2012 FINAL   Final   Organism ID, Bacteria METHICILLIN RESISTANT STAPHYLOCOCCUS AUREUS   Final  GRAM STAIN     Status: None   Collection Time    10/25/12  2:04 PM      Result Value Range Status   Specimen Description SYNOVIAL JOINT,ELBOW   Final   Special Requests Normal   Final   Gram Stain     Final   Value: WBC PRESENT, PREDOMINANTLY PMN     NO ORGANISMS SEEN     Gram Stain Report Called to,Read Back By and Verified With: S.WEST/[id number]/MURPHYD     CYTOSPIN   Report Status 10/25/2012 FINAL   Final  SURGICAL PCR SCREEN     Status: None   Collection Time    10/26/12  7:22 AM      Result Value Range Status   MRSA, PCR NEGATIVE  NEGATIVE Final   Staphylococcus aureus NEGATIVE  NEGATIVE Final   Comment:            The Xpert SA Assay (FDA     approved for NASAL specimens     in patients over 27 years of age),     is one component of     a comprehensive surveillance     program.  Test performance has  been validated by [facility] for patients greater     than or equal to 32 year old.     It is not intended     to diagnose infection nor to     guide or monitor treatment.    Anti-infectives   Start     Dose/Rate Route Frequency Ordered Stop   10/30/12 0600  vancomycin (VANCOCIN) 1,250 mg in sodium chloride 0.9 % 250 mL IVPB  Status:  Discontinued     1,250 mg 166.7 mL/hr over 90 Minutes Intravenous Every 8 hours 10/29/12 2251 11/01/12 0813   10/29/12 1105  gentamicin (GARAMYCIN) injection  Status:  Discontinued       As needed 10/29/12 1105 10/29/12 1126   10/29/12 1102  vancomycin (VANCOCIN) powder  Status:  Discontinued       As needed 10/29/12 1103 10/29/12 1126   10/27/12 1300  vancomycin (VANCOCIN) IVPB 1000 mg/200 mL premix     1,000 mg 200 mL/hr over 60 Minutes Intravenous Every 8 hours 10/27/12 1157 10/29/12 2346   10/27/12 0000  Tobramycin 40 mg in 0.9% NS 80 mL (0.5 mg/mL concentration)  Status:  Discontinued     20 mL/hr  over 48 Hours Irrigation Every 8 hours 10/26/12 2300 10/29/12 1543   10/26/12 2200  tobramycin (NEBCIN) injection 40 mg  Status:  Discontinued     40 mg Intramuscular Every 8 hours 10/26/12 1637 10/26/12 2248   10/26/12 1445  sodium chloride 0.9 % 80 mL with tobramycin (NEBCIN) 40 mg infusion  Status:  Discontinued     10-20 mL/hr  Intravenous To Surgery 10/26/12 1436 10/26/12 1651   10/26/12 1442  tobramycin (NEBCIN) injection  Status:  Discontinued       As needed 10/26/12 1451 10/26/12 1527   10/26/12 0000  Tobramycin 40 mg in 0.9% NS 80 mL (0.5 mg/mL concentration)  Status:  Discontinued     20 mL/hr  Irrigation Every 8 hours 10/26/12 2248 10/26/12 2300   10/25/12 2000  vancomycin (VANCOCIN) IVPB 1000 mg/200 mL premix     1,000 mg 200 mL/hr over 60 Minutes Intravenous  Once 10/25/12 1853 10/25/12 2312   10/25/12 1930  piperacillin-tazobactam (ZOSYN) IVPB 3.375 g     3.375 g 100 mL/hr over 30 Minutes Intravenous  Once 10/25/12 1853 10/25/12 2114   10/25/12 1800  tobramycin (NEBCIN) injection 40 mg  Status:  Discontinued     40 mg Intramuscular  Once 10/25/12 1758 10/26/12 1636      Assessment: 51 YOM who is s/p I&D and hardware removal of his left elbow, to go for repeat I&D on Friday who continues on vancomycin. His vancomycin trough was low this morning (10.72mcg/mL) on a regimen of 1250mg  IV Q8h. His renal function has remained stable. Based on this level and his previous level, kinetics were used to formulate a new regimen. ID recommends 6-8 weeks on IV antibiotics followed by a PO regimen.  Goal of Therapy:  Vancomycin trough level 15-20 mcg/ml  Plan:  1. Increase to vancomycin 1750mg  IV q8h 2. Will check another trough at the next steady state 3. Follow renal function, cultures/sensitivities, fever curve, and WBC 4. Follow up for discharge planning  Lafayette Dunlevy D. Ayen Viviano, PharmD Clinical Pharmacist Pager: [phone] 11/01/2012 8:23 AM

## 2012-11-01 NOTE — Evaluation (Addendum)
Physical Therapy Evaluation Patient Details Name: [name] [name] MRN: [id number] DOB: May 14, 1960 Today's Date: 11/01/2012 Time: 4098-1191 PT Time Calculation (min): 26 min  PT Assessment / Plan / Recommendation History of Present Illness  Pt is a 52 y/o male admitted with sepsis of L elbow joint.   Clinical Impression  Pt presents with acute pain and decreased functional mobility while maintaining L UE NWB status. Pt refuses ambulation and transferring to recliner, so full extent of functional mobility and limitations may not have been seen at this time. He demonstrates good strength grossly and is able to perform transfers with supervision and frequent cueing for NWB status on L UE. Pt expects to be d/c'd home with son, however when ambulation is further assessed, d/c disposition may require short term rehab for safety and independence prior to return home. PT will continue to see this patient for stair training, and gait/balance training - with AD if needed - prior to d/c.     PT Assessment  Patient needs continued PT services    Follow Up Recommendations  No PT follow up at this time - possibly SNF when ambulation is assessed.    Does the patient have the potential to tolerate intense rehabilitation      Barriers to Discharge Decreased caregiver support      Equipment Recommendations  None recommended by PT    Recommendations for Other Services     Frequency Min 2X/week    Precautions / Restrictions Precautions Precautions: Fall Required Braces or Orthoses: Sling Restrictions Weight Bearing Restrictions: Yes LUE Weight Bearing: Non weight bearing   Pertinent Vitals/Pain Pt reports 10/10 pain during session in L elbow, and reports that he had pain medication 30 minutes prior to therapy session.      Mobility  Bed Mobility Bed Mobility: Supine to Sit;Sitting - Scoot to Edge of Bed Supine to Sit: 6: Modified independent (Device/Increase time);With rails Sitting - Scoot  to Edge of Bed: 6: Modified independent (Device/Increase time);With rail Details for Bed Mobility Assistance: Pt did not require any physical assist for transfer to EOB, however was cued for NWB status on L UE. Transfers Transfers: Sit to Stand;Stand to Sit Sit to Stand: 5: Supervision;With upper extremity assist;From bed Stand to Sit: 5: Supervision;With armrests;To bed Details for Transfer Assistance: Pt did not require assistance to perform transfers, however was cued to maintain NWB status on L UE. Ambulation/Gait Ambulation/Gait Assistance: Not tested (comment) Stairs: No    Exercises     PT Diagnosis: Acute pain;Difficulty walking  PT Problem List: Decreased strength;Decreased range of motion;Decreased activity tolerance;Decreased balance;Decreased mobility;Decreased knowledge of use of DME;Decreased safety awareness;Decreased knowledge of precautions;Pain PT Treatment Interventions: DME instruction;Gait training;Stair training;Functional mobility training;Therapeutic activities;Therapeutic exercise;Neuromuscular re-education;Patient/family education     PT Goals(Current goals can be found in the care plan section) Acute Rehab PT Goals Patient Stated Goal: To return home PT Goal Formulation: With patient Time For Goal Achievement: 11/08/12 Potential to Achieve Goals: Good  Visit Information  Last PT Received On: 11/01/12 Assistance Needed: +1 History of Present Illness: Pt is a 52 y/o male admitted with sepsis of L elbow joint.        Prior Functioning  Home Living Family/patient expects to be discharged to:: Private residence Living Arrangements: Children Available Help at Discharge: Family;Available PRN/intermittently Type of Home: House Home Access: Stairs to enter [employer] of Steps: 7 Entrance Stairs-Rails: Right Home Layout: One level Home Equipment: None Prior Function Level of Independence: Independent Communication Communication: No  difficulties Dominant Hand: Right    Cognition  Cognition Arousal/Alertness: Lethargic Behavior During Therapy: Flat affect Overall Cognitive Status: Within Functional Limits for tasks assessed    Extremity/Trunk Assessment Upper Extremity Assessment Upper Extremity Assessment: LUE deficits/detail LUE Deficits / Details: Pt is NWB on L UE and required frequent cueing to maintain WB precautions during functional mobility. Pt reports he is not putting weight through his elbow, however is leaning heavily on his thighs while sitting and pushing through his hand while scooting to EOB. LUE: Unable to fully assess due to pain;Unable to fully assess due to immobilization Lower Extremity Assessment Lower Extremity Assessment: Overall WFL for tasks assessed Cervical / Trunk Assessment Cervical / Trunk Assessment: Normal   Balance Balance Balance Assessed: Yes Static Sitting Balance Static Sitting - Balance Support: Right upper extremity supported;Feet supported Static Sitting - Level of Assistance: 7: Independent Static Sitting - Comment/# of Minutes: 5  End of Session PT - End of Session Activity Tolerance: Patient limited by lethargy;Patient limited by pain;Patient limited by fatigue Patient left: in bed;with call bell/phone within reach  GP     Ruthann Cancer 11/01/2012, 11:52 AM  Ruthann Cancer, PT Acute Rehabilitation Services

## 2012-11-01 NOTE — Clinical Social Work Note (Signed)
CSW met with patient to discuss the progress of SNF placement. CSW asked patient if he would like to have any family contacted. Patient does not want his son to be contacted about SNF placement.   [name] [name], [place], [place], [phone]

## 2012-11-01 NOTE — Progress Notes (Addendum)
PATIENT DETAILS Name: [name] [name] Age: 52 y.o. Sex: male Date of Birth: 1961/02/07 Admit Date: 10/25/2012 Admitting Physician Doree Albee, MD PCP:No primary provider on file.  [name] [name] is a 52 yo male with significant past medical history of alcohol abuse, seizures, and brain aneurysm who presented with left elbow septic joint. Patient was in jail 3 months ago in [facility] where he fell on left elbow and had multiple fractures. In April 2014 patient had ORIF of proximal ulna and has had progressive swelling, pain, and redness since surgery. Recently, patient was off seizure medications and had an unwitnessed seizure on 8/6 when he may have hit his left elbow. Upon admission to [facility], xray of left elbow demonstrated soft tissue swelling and fluid drained from elbow contained >170k WBCs, predominant neutrophils, gram stain was negative. Dr. [name] was contacted about case, recommended intraarticular tobramycin injection by ER and patient transferred to [facility] for OR and medical admission.  Subjective: No fever overnight.  Patient reports constant pain in elbow that is not controlled by the pain medication he is being given.  During exam patient moving left arm well and reminded not to put any weight on left arm.  Assessment/Plan: MRSA Septic arthritis, Left elbow  -H/o ORIF of proximal ulna, MRSA septic arthritis  -Joint aspiration with intraarticular tobramycin injection on 8/7  -Joint aspiration fluid showed >170k WBCs, primarily neutrophils, gram stain was negative.  -On IV vancomycin, d/c'd tobramycin for joint space irrigation following hardware removal 8/11  -Infectious Disease is following to provide guidance for antibiotic coverage, recommend continue vanc for at least 6-8 weeks.  -Debridement performed 8/11 with olecranon hardware removal and antibiotic beads placed by Dr. [name]  - Dr. [name] to re-evaluate 8/15 with possible third I&D procedure, hold  prophylactic heparin morning of 8/15 for sx.  -Has complex situation with PICC line in his right hand, left hand in splint and IV antibiotics. Patient agreeable to SNF at D/C for long term antibiotics.  - Oxy ir 5-10 mg q 4 prn and minimal dose dilaudid for pain.   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  Seizure  -Continue carbamazepine at 400 mg TID  -Carbamazepine levels checked 8/7, low at <0.5. At recheck of Total Tegretol level on 8/13 within therapeutic range.  -Monitor for seizure activity   Hypertension  -Stable  -Continue metoprolol   EtOH abuse  -CIWA evaluation did not show withdrawal symptoms  -Ammonia level 8/7, upper limits of normal  -Continue IV thiamine and folate   Probable Chronic Liver Disease  -Likely cirrhosis secondary to Hep C and history of alcoholism  -Avoid hepatotoxic medications, d/c percocet and start oxycodone without acetaminophen  -Continue to monitor CMP  -INR Normal  Disposition: Remain inpatient  DVT Prophylaxis: Prophylactic Heparin   Code Status: Full code  Family Communication None at bedside   Procedures:  I&D on 8/7, I&D with removal of hardware on 8/11  CONSULTS:  ID and orthopedic surgery   MEDICATIONS: Scheduled Meds: . carbamazepine  400 mg Oral TID  . docusate sodium  200 mg Oral BID  . folic acid  1 mg Oral Daily  . heparin  5,000 Units Subcutaneous Q8H  . metoprolol  100 mg Oral BID  . pantoprazole  40 mg Oral Daily  . thiamine  100 mg Oral Daily  . traZODone  50 mg Oral QHS  . vancomycin  1,750 mg Intravenous Q8H   Continuous Infusions:  PRN Meds:.HYDROmorphone (DILAUDID) injection,  oxyCODONE  Antibiotics: Anti-infectives   Start     Dose/Rate Route Frequency Ordered Stop   11/01/12 0900  vancomycin (VANCOCIN) 1,750 mg in sodium chloride 0.9 % 500 mL IVPB     1,750 mg 250 mL/hr over 120 Minutes Intravenous Every 8 hours 11/01/12 0824     10/30/12 0600   vancomycin (VANCOCIN) 1,250 mg in sodium chloride 0.9 % 250 mL IVPB  Status:  Discontinued     1,250 mg 166.7 mL/hr over 90 Minutes Intravenous Every 8 hours 10/29/12 2251 11/01/12 0813   10/29/12 1105  gentamicin (GARAMYCIN) injection  Status:  Discontinued       As needed 10/29/12 1105 10/29/12 1126   10/29/12 1102  vancomycin (VANCOCIN) powder  Status:  Discontinued       As needed 10/29/12 1103 10/29/12 1126   10/27/12 1300  vancomycin (VANCOCIN) IVPB 1000 mg/200 mL premix     1,000 mg 200 mL/hr over 60 Minutes Intravenous Every 8 hours 10/27/12 1157 10/29/12 2346   10/27/12 0000  Tobramycin 40 mg in 0.9% NS 80 mL (0.5 mg/mL concentration)  Status:  Discontinued     20 mL/hr over 48 Hours Irrigation Every 8 hours 10/26/12 2300 10/29/12 1543   10/26/12 2200  tobramycin (NEBCIN) injection 40 mg  Status:  Discontinued     40 mg Intramuscular Every 8 hours 10/26/12 1637 10/26/12 2248   10/26/12 1445  sodium chloride 0.9 % 80 mL with tobramycin (NEBCIN) 40 mg infusion  Status:  Discontinued     10-20 mL/hr  Intravenous To Surgery 10/26/12 1436 10/26/12 1651   10/26/12 1442  tobramycin (NEBCIN) injection  Status:  Discontinued       As needed 10/26/12 1451 10/26/12 1527   10/26/12 0000  Tobramycin 40 mg in 0.9% NS 80 mL (0.5 mg/mL concentration)  Status:  Discontinued     20 mL/hr  Irrigation Every 8 hours 10/26/12 2248 10/26/12 2300   10/25/12 2000  vancomycin (VANCOCIN) IVPB 1000 mg/200 mL premix     1,000 mg 200 mL/hr over 60 Minutes Intravenous  Once 10/25/12 1853 10/25/12 2312   10/25/12 1930  piperacillin-tazobactam (ZOSYN) IVPB 3.375 g     3.375 g 100 mL/hr over 30 Minutes Intravenous  Once 10/25/12 1853 10/25/12 2114   10/25/12 1800  tobramycin (NEBCIN) injection 40 mg  Status:  Discontinued     40 mg Intramuscular  Once 10/25/12 1758 10/26/12 1636       PHYSICAL EXAM: Vital signs in last 24 hours: Filed Vitals:   11/01/12 0522 11/01/12 1111 11/01/12 1123 11/01/12 1355  BP:  137/80 107/70  129/73  Pulse: 65  78 65  Temp: 98.7 F (37.1 C)   98.8 F (37.1 C)  TempSrc: Oral   Oral  Resp: 18   20  Height:      Weight:      SpO2: 95%   94%    Weight change:  Filed Weights   10/25/12 2202  Weight: 81.9 kg (180 lb 8.9 oz)   Body mass index is 20.87 kg/(m^2).   Gen Exam: Awake and alert with clear speech.   Neck: Supple, No JVD.   Chest: B/L Clear.   CVS: S1 S2 Regular, no murmurs.  Abdomen: Soft, BS +, non tender, non distended.  Extremities: No edema, lower extremities warm to touch.  Left elbow wound vac intact with posterior long arm splint, dry bandage, and sling. Neurologic: Non Focal.   Skin: No Rash.     Intake/Output  from previous day:  Intake/Output Summary (Last 24 hours) at 11/01/12 1533 Last data filed at 11/01/12 1300  Gross per 24 hour  Intake   1740 ml  Output   4375 ml  Net  -2635 ml     LAB RESULTS: CBC  Recent Labs Lab 10/27/12 0516 10/28/12 0610 10/30/12 0815 10/31/12 1416 11/01/12 0640  WBC 7.1 5.6 6.1 6.4 6.9  HGB 10.6* 11.0* 10.7* 11.3* 11.0*  HCT 29.7* 30.6* 29.6* 31.8* 30.2*  PLT 61* 83* 122* 162 180  MCV 99.7 98.4 97.4 99.4 97.4  MCH 35.6* 35.4* 35.2* 35.3* 35.5*  MCHC 35.7 35.9 36.1* 35.5 36.4*  RDW 13.0 12.7 12.8 13.2 12.8  LYMPHSABS 2.8 2.4 2.9 1.9 2.7  MONOABS 0.9 0.7 0.7 0.8 1.0  EOSABS 0.0 0.0 0.0 0.0 0.1  BASOSABS 0.0 0.0 0.0 0.0 0.0    Chemistries   Recent Labs Lab 10/26/12 0454 10/27/12 0516 10/28/12 0610 10/30/12 0815 11/01/12 0932  NA 133* 133* 134* 130* 129*  K 3.7 3.5 3.4* 3.7 4.1  CL 98 99 97 94* 94*  CO2 26 25 27 26 26   GLUCOSE 127* 114* 119* 105* 99  BUN 7 7 6 9 6   CREATININE 0.59 0.59 0.55 0.61 0.60  CALCIUM 8.8 8.0* 8.5 8.7 8.9     Coagulation profile  Recent Labs Lab 10/27/12 0516  INR 0.99     MICROBIOLOGY: Recent Results (from the past 240 hour(s))  BODY FLUID CULTURE     Status: None   Collection Time    10/25/12  2:04 PM      Result Value Range  Status   Specimen Description SYNOVIAL JOINT,ELBOW   Final   Special Requests Normal   Final   Gram Stain     Final   Value: ABUNDANT WBC PRESENT, PREDOMINANTLY PMN     NO ORGANISMS SEEN     Gram Stain Report Called to,Read Back By and Verified With: Gram Stain Report Called to,Read Back By and Verified With: S WEST 1650 10/25/12 BY MURPHYD Performed by [facility]     Performed at [facility]   Culture     Final   Value: FEW METHICILLIN RESISTANT STAPHYLOCOCCUS AUREUS     Note: RIFAMPIN AND GENTAMICIN SHOULD NOT BE USED AS SINGLE DRUGS FOR TREATMENT OF STAPH INFECTIONS. This organism DOES NOT demonstrate inducible Clindamycin resistance in vitro.     Note: CRITICAL RESULT CALLED TO, READ BACK BY AND VERIFIED WITH: ROBIN ZONER 10/27/12 @ 4;58PM BY RUSCOE A. CRITICAL RESULT CALLED TO, READ BACK BY AND VERIFIED WITH: DORA GARDNER 10/27/12 @ 7:14PM BY RUSCOE A.     Performed at [employer] [facility]   Report Status 10/28/2012 FINAL   Final   Organism ID, Bacteria METHICILLIN RESISTANT STAPHYLOCOCCUS AUREUS   Final  GRAM STAIN     Status: None   Collection Time    10/25/12  2:04 PM      Result Value Range Status   Specimen Description SYNOVIAL JOINT,ELBOW   Final   Special Requests Normal   Final   Gram Stain     Final   Value: WBC PRESENT, PREDOMINANTLY PMN     NO ORGANISMS SEEN     Gram Stain Report Called to,Read Back By and Verified With: S.WEST/[id number]/[email or web address]     CYTOSPIN   Report Status 10/25/2012 FINAL   Final  SURGICAL PCR SCREEN     Status: None   Collection Time    10/26/12  7:22 AM  Result Value Range Status   MRSA, PCR NEGATIVE  NEGATIVE Final   Staphylococcus aureus NEGATIVE  NEGATIVE Final   Comment:            The Xpert SA Assay (FDA     approved for NASAL specimens     in patients over 90 years of age),     is one component of     a comprehensive surveillance     program.  Test performance has     been validated by [employer] for patients  greater     than or equal to 46 year old.     It is not intended     to diagnose infection nor to     guide or monitor treatment.    RADIOLOGY STUDIES/RESULTS: Dg Elbow 2 Views Left  10/29/2012   *RADIOLOGY REPORT*  Clinical Data: Postop hardware removal  LEFT ELBOW - 2 VIEW  Comparison: 10/29/2012, 10/25/2012  Findings: Olecrenon fixation hardware has been removed.  Olecrenon fracture remains present with normal alignment.  Soft tissue swelling posteriorly.  Joint effusion noted.  Operative site radiopaque antibiotic beads present. Wound vac in place posteriorly.  IMPRESSION: Removal of the olecranon hardware.  Olecrenon fracture again evident.  Stable alignment.   Original Report Authenticated By: Judie Petit. Miles Costain, M.D.   Dg Elbow 2 Views Left  10/29/2012   *RADIOLOGY REPORT*  Clinical Data: Infected nonunion of olecranon.  LEFT ELBOW - 2 VIEW  Comparison: 10/25/2012.  Findings: Interval application of a wound vac.  Extensive soft tissue swelling has worsened, particularly ventral to the elbow. Elbow joint effusion again seen.  Olecranon fracture with sclerotic margins, consistent with nonunion.  Redemonstrated plate and screw fixation hardware, with lucencies around the majority of the screws, particularly near the bone- plate margin. There is a notable lucency along the mid portion of the longer olecranon screw.  No evidence of hardware fracture.  No acute osseous fracture.  IMPRESSION:  1.  Increasing periarticular soft tissue swelling. 2.  Nonunited olecranon fracture.  There is lucency around multiple fixation screws, suggesting osseous infection. 3.  Elbow joint effusion.   Original Report Authenticated By: Tiburcio Pea   Dg Elbow Complete Left  10/25/2012   *RADIOLOGY REPORT*  Clinical Data: Diffuse left elbow pain and swelling.  Surgery in April 2014.  Recent fall.  LEFT ELBOW - COMPLETE 3+ VIEW  Comparison: No prior elbow graft radiographs available for comparison  Findings: The elbow is located.   Best seen in the sagittal projection is a subacute appearing fracture of the proximal ulna, near the level of the elbow joint.  There are postoperative changes of internal fixation of the proximal ulna, extending from the olecranon to the proximal diaphysis, including a cortical plate and multiple screws. The hardware appears intact.  No definite acute fracture is identified.  There is a joint effusion.  Diffuse subcutaneous soft tissue swelling is seen about the elbow.  There are tiny calcifications post in the soft tissues posterior to the elbow joint, of uncertain chronicity.  IMPRESSION:  1.  Elbow joint effusion and diffuse soft tissue swelling posterior to the elbow. 2.  Subacute appearing fracture of the proximal ulna in this patient status post ORIF of the proximal ulna.   Original Report Authenticated By: Britta Mccreedy, M.D.    Michae Kava, PA-S [place], [place]  [facility] [phone]  If 7PM-7AM, please contact night-coverage [email or web address] Password [facility] 11/01/2012, 3:33 PM   LOS: 7 days

## 2012-11-01 NOTE — Progress Notes (Signed)
Patient seen and examined. Agree with note by Remigio Eisenmenger, PA. For surgical reexploration tomorrow. Hopeful for DC SNF soon thereafter. Will need to continue Vanc for 6-8 weeks.  Peggye Pitt, MD [facility] Pager: [phone]

## 2012-11-02 ENCOUNTER — Inpatient Hospital Stay (HOSPITAL_COMMUNITY): Payer: Medicaid Other | Admitting: Anesthesiology

## 2012-11-02 ENCOUNTER — Encounter (HOSPITAL_COMMUNITY)
Admission: EM | Disposition: A | Discharge status: Home / Self Care | Payer: Self-pay | Source: Home / Self Care | Attending: Internal Medicine

## 2012-11-02 ENCOUNTER — Encounter (HOSPITAL_COMMUNITY): Payer: Self-pay | Admitting: Anesthesiology

## 2012-11-02 HISTORY — PX: I & D EXTREMITY: SHX5045

## 2012-11-02 HISTORY — PX: INCISION AND DRAINAGE ABSCESS: SHX5864

## 2012-11-02 LAB — CBC WITH DIFFERENTIAL/PLATELET
Basophils Absolute: 0 10*3/uL (ref 0.0–0.1)
Basophils Relative: 0 % (ref 0–1)
Blasts: 0 %
Hemoglobin: 12.1 g/dL — ABNORMAL LOW (ref 13.0–17.0)
Lymphocytes Relative: 39 % (ref 12–46)
Lymphs Abs: 2.3 10*3/uL (ref 0.7–4.0)
MCHC: 36.2 g/dL — ABNORMAL HIGH (ref 30.0–36.0)
Neutro Abs: 2.8 10*3/uL (ref 1.7–7.7)
Neutrophils Relative %: 46 % (ref 43–77)
Platelets: 215 10*3/uL (ref 150–400)
Promyelocytes Absolute: 0 %
RBC: 3.4 MIL/uL — ABNORMAL LOW (ref 4.22–5.81)
nRBC: 0 /100 WBC

## 2012-11-02 SURGERY — INCISION AND DRAINAGE, ABSCESS
Anesthesia: General | Site: Elbow | Laterality: Left | Wound class: Dirty or Infected

## 2012-11-02 MED ORDER — ONDANSETRON HCL 4 MG/2ML IJ SOLN
INTRAMUSCULAR | Status: DC | PRN
  Administered 2012-11-02: 4 mg via INTRAVENOUS

## 2012-11-02 MED ORDER — LACTATED RINGERS IV SOLN
INTRAVENOUS | Status: DC | PRN
  Administered 2012-11-02: 07:00:00 via INTRAVENOUS

## 2012-11-02 MED ORDER — FENTANYL CITRATE 0.05 MG/ML IJ SOLN
INTRAMUSCULAR | Status: DC | PRN
  Administered 2012-11-02 (×2): 100 ug via INTRAVENOUS
  Administered 2012-11-02: 50 ug via INTRAVENOUS

## 2012-11-02 MED ORDER — CEFAZOLIN SODIUM-DEXTROSE 2-3 GM-% IV SOLR
INTRAVENOUS | Status: AC
  Filled 2012-11-02: qty 50

## 2012-11-02 MED ORDER — HYDROMORPHONE HCL PF 1 MG/ML IJ SOLN
INTRAMUSCULAR | Status: AC
  Filled 2012-11-02: qty 1

## 2012-11-02 MED ORDER — LIDOCAINE HCL (CARDIAC) 20 MG/ML IV SOLN
INTRAVENOUS | Status: DC | PRN
  Administered 2012-11-02: 80 mg via INTRAVENOUS

## 2012-11-02 MED ORDER — OXYCODONE HCL 5 MG/5ML PO SOLN: 5.0000 mg | Freq: Once | ORAL | Status: DC | PRN

## 2012-11-02 MED ORDER — 0.9 % SODIUM CHLORIDE (POUR BTL) OPTIME
TOPICAL | Status: DC | PRN
  Administered 2012-11-02: 1000 mL

## 2012-11-02 MED ORDER — OXYCODONE HCL 5 MG PO TABS
ORAL_TABLET | ORAL | Status: AC
  Filled 2012-11-02: qty 1

## 2012-11-02 MED ORDER — HYDROMORPHONE HCL PF 1 MG/ML IJ SOLN
0.2500 mg | INTRAMUSCULAR | Status: DC | PRN
  Administered 2012-11-02 (×4): 0.5 mg via INTRAVENOUS

## 2012-11-02 MED ORDER — SUCCINYLCHOLINE CHLORIDE 20 MG/ML IJ SOLN
INTRAMUSCULAR | Status: DC | PRN
  Administered 2012-11-02: 100 mg via INTRAVENOUS

## 2012-11-02 MED ORDER — PROPOFOL 10 MG/ML IV BOLUS
INTRAVENOUS | Status: DC | PRN
  Administered 2012-11-02: 200 mg via INTRAVENOUS

## 2012-11-02 MED ORDER — HYDROCODONE-ACETAMINOPHEN 5-325 MG PO TABS: 1.0000 | ORAL_TABLET | Freq: Four times a day (QID) | ORAL | Status: DC | PRN

## 2012-11-02 MED ORDER — OXYCODONE HCL 5 MG PO TABS: 5.0000 mg | ORAL_TABLET | Freq: Once | ORAL | Status: DC | PRN

## 2012-11-02 MED ORDER — CARBAMAZEPINE 200 MG PO TABS: 400.0000 mg | ORAL_TABLET | Freq: Three times a day (TID) | ORAL | Status: DC

## 2012-11-02 MED ORDER — OXYCODONE HCL 5 MG PO TABS
ORAL_TABLET | ORAL | Status: AC
  Filled 2012-11-02: qty 2

## 2012-11-02 MED ORDER — CEFAZOLIN SODIUM-DEXTROSE 2-3 GM-% IV SOLR
INTRAVENOUS | Status: DC | PRN
  Administered 2012-11-02: 2 g via INTRAVENOUS

## 2012-11-02 MED ORDER — DIPHENHYDRAMINE HCL 25 MG PO CAPS: 25.0000 mg | ORAL_CAPSULE | Freq: Four times a day (QID) | ORAL | Status: DC | PRN

## 2012-11-02 MED ORDER — DOXYCYCLINE HYCLATE 50 MG PO CAPS: ORAL_CAPSULE | ORAL | Status: DC

## 2012-11-02 MED ORDER — PHENYLEPHRINE HCL 10 MG/ML IJ SOLN
INTRAMUSCULAR | Status: DC | PRN
  Administered 2012-11-02: 80 ug via INTRAVENOUS

## 2012-11-02 SURGICAL SUPPLY — 33 items
BANDAGE ELASTIC 4 VELCRO ST LF (GAUZE/BANDAGES/DRESSINGS) ×4 IMPLANT
CLOTH BEACON ORANGE TIMEOUT ST (SAFETY) ×2 IMPLANT
COVER SURGICAL LIGHT HANDLE (MISCELLANEOUS) ×2 IMPLANT
DRSG ADAPTIC 3X8 NADH LF (GAUZE/BANDAGES/DRESSINGS) ×2 IMPLANT
ELECT REM PT RETURN 9FT ADLT (ELECTROSURGICAL) ×2
ELECTRODE REM PT RTRN 9FT ADLT (ELECTROSURGICAL) ×1 IMPLANT
GLOVE BIO SURGEON STRL SZ7.5 (GLOVE) ×2 IMPLANT
GLOVE BIO SURGEON STRL SZ8 (GLOVE) ×2 IMPLANT
GLOVE BIO SURGEON STRL SZ8.5 (GLOVE) ×2 IMPLANT
GOWN STRL NON-REIN LRG LVL3 (GOWN DISPOSABLE) ×2 IMPLANT
GOWN STRL REIN 2XL LVL4 (GOWN DISPOSABLE) ×2 IMPLANT
HANDPIECE INTERPULSE COAX TIP (DISPOSABLE)
KIT BASIN OR (CUSTOM PROCEDURE TRAY) ×2 IMPLANT
KIT ROOM TURNOVER OR (KITS) ×2 IMPLANT
MANIFOLD NEPTUNE WASTE (CANNULA) ×2 IMPLANT
NS IRRIG 1000ML POUR BTL (IV SOLUTION) ×2 IMPLANT
PACK ORTHO EXTREMITY (CUSTOM PROCEDURE TRAY) ×2 IMPLANT
PAD ARMBOARD 7.5X6 YLW CONV (MISCELLANEOUS) ×4 IMPLANT
PAD CAST 4YDX4 CTTN HI CHSV (CAST SUPPLIES) ×1 IMPLANT
PADDING CAST ABS 4INX4YD NS (CAST SUPPLIES) ×1
PADDING CAST ABS COTTON 4X4 ST (CAST SUPPLIES) ×1 IMPLANT
PADDING CAST COTTON 4X4 STRL (CAST SUPPLIES) ×1
SET HNDPC FAN SPRY TIP SCT (DISPOSABLE) IMPLANT
SLING ARM IMMOBILIZER XL (CAST SUPPLIES) ×2 IMPLANT
SPONGE GAUZE 4X4 12PLY (GAUZE/BANDAGES/DRESSINGS) ×4 IMPLANT
STOCKINETTE IMPERVIOUS 9X36 MD (GAUZE/BANDAGES/DRESSINGS) ×2 IMPLANT
SUT ETHILON 2 0 FS 18 (SUTURE) ×4 IMPLANT
SUT PDS AB 1 CT  36 (SUTURE) ×2
SUT PDS AB 1 CT 36 (SUTURE) ×2 IMPLANT
TOWEL OR 17X24 6PK STRL BLUE (TOWEL DISPOSABLE) ×2 IMPLANT
TOWEL OR 17X26 10 PK STRL BLUE (TOWEL DISPOSABLE) ×2 IMPLANT
TUBE CONNECTING 12X1/4 (SUCTIONS) ×2 IMPLANT
YANKAUER SUCT BULB TIP NO VENT (SUCTIONS) ×2 IMPLANT

## 2012-11-02 NOTE — Anesthesia Procedure Notes (Signed)
Procedure Name: Intubation Date/Time: 11/02/2012 7:21 AM Performed by: Arlice Colt B Pre-anesthesia Checklist: Patient identified, Emergency Drugs available, Suction available, Patient being monitored and Timeout performed Patient Re-evaluated:Patient Re-evaluated prior to inductionOxygen Delivery Method: Circle system utilized Preoxygenation: Pre-oxygenation with 100% oxygen Intubation Type: IV induction and Rapid sequence Laryngoscope Size: Mac and 3 Grade View: Grade II Tube type: Oral Tube size: 7.5 mm Number of attempts: 1 Airway Equipment and Method: Stylet Placement Confirmation: ETT inserted through vocal cords under direct vision,  positive ETCO2 and breath sounds checked- equal and bilateral Secured at: 22 cm Tube secured with: Tape Dental Injury: Teeth and Oropharynx as per pre-operative assessment

## 2012-11-02 NOTE — Progress Notes (Signed)
Patient complained about not receiving his pain medication. RN explained to patient that his IV had infiltrated and that he would need a new one started before the medication could be given. Patient still continued to disagree with RN stating, " Just put the medication through the IV anyway, It doesn't bother me". RN explained to patient that he has to have an IV in order to receive the medication. RN started new IV and gave pt has medication. Pt is relaxed and asked that his light he turned off. Will continue to check on patient.

## 2012-11-02 NOTE — Clinical Social Work Note (Signed)
CSW secured bed at [facility] in [place], [place]. [facility] sent nurse to assess patient for admission. [facility] admission's coordinator contacted CSW to confirm that patient would not be accepted by facility. CSW explained to patient that there is only one facility that will be able to offer him a bed. Patient proceeded to state that he would not go to a SNF unless he has his own private room, television, and would be able to sign himself in and out of the facility to "go out to eat". CSW explained to patient that this cannot be guaranteed and the patient said that he would not be going to the facility. Patient then refused his picc line, which makes him ineligible for SNF placement. CSW discussed this with MD and PA. Per MD and PA, patient will be DC home on PO antibiotics for his infection. Patient was very irritated with CSW and other staff. CSW cancelled bed with [name] [name]. Patient's son states that patient can stay at son's house after DC but patient states that he is going to stay at a motel. CSW informed RNCM of change in DC plan. CSW signing off.    [name] [name], [place], [place], [phone]

## 2012-11-02 NOTE — Progress Notes (Signed)
    Subjective:  Patient reports pain as mild.    Objective:   VITALS:   Filed Vitals:   11/02/12 0830 11/02/12 0845 11/02/12 0900 11/02/12 1009  BP: 129/61 146/74  135/74  Pulse:    67  Temp:   98 F (36.7 C)   TempSrc:      Resp: 16 18 18    Height:      Weight:      SpO2: 100% 100% 100%     LUE:  Dressing: C/D/I  Compartments soft  SILT M/R/U, 2+rad puls, +EPL/FPL/IO   LABS  Results for orders placed during the hospital encounter of 10/25/12 (from the past 24 hour(s))  CBC WITH DIFFERENTIAL     Status: Abnormal (Preliminary result)   Collection Time    11/02/12 11:17 AM      Result Value Range   WBC 6.0  4.0 - 10.5 K/uL   RBC 3.40 (*) 4.22 - 5.81 MIL/uL   Hemoglobin 12.1 (*) 13.0 - 17.0 g/dL   HCT 16.1 (*) 09.6 - 04.5 %   MCV 98.2  78.0 - 100.0 fL   MCH 35.6 (*) 26.0 - 34.0 pg   MCHC 36.2 (*) 30.0 - 36.0 g/dL   RDW 40.9  81.1 - 91.4 %   Platelets 215  150 - 400 K/uL   LUCs, % PENDING  0 - 4 %   LUC, Absolute PENDING  0.0 - 0.5 K/uL   Other PENDING     Other 2 PENDING     Neutrophils Relative % 46  43 - 77 %   Lymphocytes Relative 39  12 - 46 %   Monocytes Relative 14 (*) 3 - 12 %   Eosinophils Relative 1  0 - 5 %   Basophils Relative 0  0 - 1 %   Band Neutrophils 0  0 - 10 %   Metamyelocytes Relative 0     Myelocytes 0     Promyelocytes Absolute 0     Blasts 0     nRBC 0  0 /100 WBC   Neutro Abs 2.8  1.7 - 7.7 K/uL   Lymphs Abs 2.3  0.7 - 4.0 K/uL   Monocytes Absolute 0.8  0.1 - 1.0 K/uL   Eosinophils Absolute 0.1  0.0 - 0.7 K/uL   Basophils Absolute 0.0  0.0 - 0.1 K/uL     Assessment/Plan: Day of Surgery   Principal Problem:   Septic joint of left elbow Active Problems:   History of alcohol abuse   Chronic liver disease   Thrombocytopenia   Hepatitis C   Convulsions/seizures   Essential hypertension, benign   Hyponatremia   PLAN: Wound has been closed NWB LUE Please hold VTE px on Friday mornig VTE prophylaxis: SCD,  Heparin  DISPO:I spoke with Dr. Daphine Deutscher at [facility] health and he has agreed to take over care from an orthopedic standpoint as an outpatient via their Hand and upper extremity team.   Margarita Rana, D 11/02/2012, 2:02 PM   Margarita Rana, MD Cell [phone]

## 2012-11-02 NOTE — Transfer of Care (Signed)
Immediate Anesthesia Transfer of Care Note  Patient: [name] [name]  Procedure(s) Performed: Procedure(s): INCISION AND DRAINAGE ABSCESS (Left) IRRIGATION AND DEBRIDEMENT EXTREMITY (Left)  Patient Location: PACU  Anesthesia Type:General  Level of Consciousness: awake, alert  and oriented  Airway & Oxygen Therapy: Patient Spontanous Breathing  Post-op Assessment: Report given to PACU RN and Post -op Vital signs reviewed and stable  Post vital signs: Reviewed and stable  Complications: No apparent anesthesia complications

## 2012-11-02 NOTE — Anesthesia Postprocedure Evaluation (Signed)
  Anesthesia Post-op Note  Patient: [name] [name]  Procedure(s) Performed: Procedure(s): INCISION AND DRAINAGE ABSCESS (Left) IRRIGATION AND DEBRIDEMENT EXTREMITY (Left)  Patient Location: PACU  Anesthesia Type:General  Level of Consciousness: awake and alert   Airway and Oxygen Therapy: Patient Spontanous Breathing and Patient connected to nasal cannula oxygen  Post-op Pain: mild  Post-op Assessment: Post-op Vital signs reviewed, Patient's Cardiovascular Status Stable, Respiratory Function Stable, Patent Airway and No signs of Nausea or vomiting  Post-op Vital Signs: Reviewed and stable  Complications: No apparent anesthesia complications

## 2012-11-02 NOTE — Discharge Summary (Signed)
Physician Discharge Summary  [name] [name] EXB:[id number] DOB: Aug 27, 1960 DOA: 54/09/2012  PCP: No primary provider on file.  Admit date: 10/25/2012 Discharge date: 11/02/2012  Time spent: 45 minutes  Recommendations for Outpatient Follow-up:  The patient will be on Doxycycline 100 mg bid for two months (until 10 /15/ 2014) Follow up with the [facility]) [facility] and [facility]rem[facility] within two weeks Follow up with Infectious Disease clinic (at [facility] [place]) in 4-5 weeks. Check cmp. (MRSA infected Joint and Hep C)  Discharge Diagnoses:  Principal Problem:   Septic joint of left elbow Active Problems:   History of alcohol abuse   Chronic liver disease   Thrombocytopenia   Hepatitis C   Convulsions/seizures   Essential hypertension, benign   Hyponatremia   Discharge Condition: stable.  Diet recommendation: regular diet.  Filed Weights   10/25/12 2202  Weight: 81.9 kg (180 lb 8.9 oz)    History of present illness at the time of admission:  This is a 52 y.o. year old male with significant past medical history of ETOH abuse, seizures, brain aneurysm presenting with L elbow septic joint. Pt was in jail 3 months ago in [place]. Fell on L elbow. Had multiple fractures to L elbow. Had ORIF of proximal ulna. Pt states that he has had progressive swelling, pain and redness since April when surgery occurred. Has been off of seizure meds. Had unwitnessed seizure he believes yesterday and landed on L elbow. Acute worsening of chronic elbow pain.  Came to [facility]. Noted soft tissue swelling on xray as well as fluid drained from elbow w/ >170k WBCs, predominant neutrophils, gram stain was negative. Dr. [name] was contacted about case. Recs were for pt to have intrajoint tobramycin injection by [facility] and [facility].   Hospital Course:  MRSA Septic arthritis, Left elbow  -H/o ORIF of proximal ulna, MRSA septic arthritis  -Joint  aspiration with intraarticular tobramycin injection on 8/7  -Joint aspiration fluid showed >170k WBCs, primarily neutrophils, gram stain was negative.  -On IV vancomycin, d/c'd tobramycin for joint space irrigation following hardware removal 8/11  -Debridement performed 8/11 with olecranon hardware removal and placement of antibiotic beads by Dr. [name]  -8/15 third I&D procedure, wound vac removed, and wound closed. -After discussion with Ortho and ID regarding antibiotics, he will be discharge on Doxy 100 mg bid x 2 months (01/02/13)  Seizure  -Continue carbamazepine at 400 mg TID  -Carbamazepine levels checked 8/7, low at <0.5. At recheck of Total Tegretol level on 8/13 within therapeutic range.   Hypertension  -Stable  -Continue metoprolol   EtOH abuse  -CIWA evaluation did not show withdrawal symptoms  - counseled to stop drinking.  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 -Normal INR    Procedures:  I&D 8/8  I&D with hardware removal 8/11  I&D  8/15  Consultations:  Orthopedic surgery  Infectious Disease  Discharge Exam: Filed Vitals:   11/02/12 1009  BP: 135/74  Pulse: 67  Temp:   Resp:     General: Tall, thin, AA male, animated, left arm wrapped in gauze and sling Cardiovascular: rrr, no m/r/g, no lower ext edema Respiratory: cta no wheeze, crackles, or rales, no accessory muscle use Abdomen:  Thin, soft, nt, nd, +bs Extremities:  Left arm wrapped.  Able to move it well. No C/C/E in  other three extremities Psychiatric:  A&O, pacing the hallway, Anxious for discharge  Discharge Instructions      Discharge Orders   Future Appointments Provider Department Dept Phone   11/28/2012 3:15 PM Randall Hiss, MD [facility] [phone]   Future Orders Complete By Expires   Diet -  low sodium heart healthy  As directed    Increase activity slowly  As directed        Medication List         carbamazepine 200 MG tablet  Commonly known as:  TEGRETOL  Take 2 tablets (400 mg total) by mouth 3 (three) times daily.     doxycycline 50 MG capsule  Commonly known as:  VIBRAMYCIN  Take 100 mg (2 capsules) twice a day for two months.     HYDROcodone-acetaminophen 5-325 MG per tablet  Commonly known as:  NORCO  Take 1-2 tablets by mouth every 6 (six) hours as needed for pain.     ibuprofen 800 MG tablet  Commonly known as:  ADVIL,MOTRIN  Take 800 mg by mouth every 8 (eight) hours as needed for pain.     metoprolol 100 MG tablet  Commonly known as:  LOPRESSOR  Take 100 mg by mouth 2 (two) times daily.       No Known Allergies    The results of significant diagnostics from this hospitalization (including imaging, microbiology, ancillary and laboratory) are listed below for reference.    Significant Diagnostic Studies: Dg Elbow 2 Views Left  10/29/2012   *RADIOLOGY REPORT*  Clinical Data: Postop hardware removal  LEFT ELBOW - 2 VIEW  Comparison: 10/29/2012, 10/25/2012  Findings: Olecrenon fixation hardware has been removed.  Olecrenon fracture remains present with normal alignment.  Soft tissue swelling posteriorly.  Joint effusion noted.  Operative site radiopaque antibiotic beads present. Wound vac in place posteriorly.  IMPRESSION: Removal of the olecranon hardware.  Olecrenon fracture again evident.  Stable alignment.   Original Report Authenticated By: Judie Petit. Miles Costain, M.D.   Dg Elbow 2 Views Left  10/29/2012   *RADIOLOGY REPORT*  Clinical Data: Infected nonunion of olecranon.  LEFT ELBOW - 2 VIEW  Comparison: 10/25/2012.  Findings: Interval application of a wound vac.  Extensive soft tissue swelling has worsened, particularly ventral to the elbow. Elbow joint effusion again seen.  Olecranon fracture with sclerotic margins, consistent with nonunion.  Redemonstrated plate  and screw fixation hardware, with lucencies around the majority of the screws, particularly near the bone- plate margin. There is a notable lucency along the mid portion of the longer olecranon screw.  No evidence of hardware fracture.  No acute osseous fracture.  IMPRESSION:  1.  Increasing periarticular soft tissue swelling. 2.  Nonunited olecranon fracture.  There is lucency around multiple fixation screws, suggesting osseous infection. 3.  Elbow joint effusion.   Original Report Authenticated By: Tiburcio Pea   Dg Elbow Complete Left  10/25/2012   *RADIOLOGY REPORT*  Clinical Data: Diffuse left elbow pain and swelling.  Surgery in April 2014.  Recent fall.  LEFT ELBOW - COMPLETE 3+ VIEW  Comparison: No prior elbow graft radiographs available for comparison  Findings: The elbow is located.  Best seen in the sagittal projection is a subacute appearing fracture of the proximal ulna, near the level of the elbow joint.  There are postoperative changes of internal fixation of the proximal ulna, extending from the olecranon to the proximal diaphysis, including a cortical plate and multiple screws. The  hardware appears intact.  No definite acute fracture is identified.  There is a joint effusion.  Diffuse subcutaneous soft tissue swelling is seen about the elbow.  There are tiny calcifications post in the soft tissues posterior to the elbow joint, of uncertain chronicity.  IMPRESSION:  1.  Elbow joint effusion and diffuse soft tissue swelling posterior to the elbow. 2.  Subacute appearing fracture of the proximal ulna in this patient status post ORIF of the proximal ulna.   Original Report Authenticated By: Britta Mccreedy, M.D.    Microbiology: Recent Results (from the past 240 hour(s))  BODY FLUID CULTURE     Status: None   Collection Time    10/25/12  2:04 PM      Result Value Range Status   Specimen Description SYNOVIAL JOINT,ELBOW   Final   Special Requests Normal   Final   Gram Stain     Final    Value: ABUNDANT WBC PRESENT, PREDOMINANTLY PMN     NO ORGANISMS SEEN     Gram Stain Report Called to,Read Back By and Verified With: Gram Stain Report Called to,Read Back By and Verified With: S WEST 1650 10/25/12 BY MURPHYD Performed by [facility]     Performed at [facility]   Culture     Final   Value: FEW METHICILLIN RESISTANT STAPHYLOCOCCUS AUREUS     Note: RIFAMPIN AND GENTAMICIN SHOULD NOT BE USED AS SINGLE DRUGS FOR TREATMENT OF STAPH INFECTIONS. This organism DOES NOT demonstrate inducible Clindamycin resistance in vitro.     Note: CRITICAL RESULT CALLED TO, READ BACK BY AND VERIFIED WITH: ROBIN ZONER 10/27/12 @ 4;58PM BY RUSCOE A. CRITICAL RESULT CALLED TO, READ BACK BY AND VERIFIED WITH: DORA GARDNER 10/27/12 @ 7:14PM BY RUSCOE A.     Performed at [facility]   Report Status 10/28/2012 FINAL   Final   Organism ID, Bacteria METHICILLIN RESISTANT STAPHYLOCOCCUS AUREUS   Final  GRAM STAIN     Status: None   Collection Time    10/25/12  2:04 PM      Result Value Range Status   Specimen Description SYNOVIAL JOINT,ELBOW   Final   Special Requests Normal   Final   Gram Stain     Final   Value: WBC PRESENT, PREDOMINANTLY PMN     NO ORGANISMS SEEN     Gram Stain Report Called to,Read Back By and Verified With: S.WEST/[id number]/[email or web address]     CYTOSPIN   Report Status 10/25/2012 FINAL   Final  SURGICAL PCR SCREEN     Status: None   Collection Time    10/26/12  7:22 AM      Result Value Range Status   MRSA, PCR NEGATIVE  NEGATIVE Final   Staphylococcus aureus NEGATIVE  NEGATIVE Final   Comment:            The Xpert SA Assay (FDA     approved for NASAL specimens     in patients over 61 years of age),     is one component of     a comprehensive surveillance     program.  Test performance has     been validated by [employer] for patients greater     than or equal to 68 year old.     It is not intended     to diagnose infection nor to     guide or monitor  treatment.     Labs: Basic Metabolic  Panel:  Recent Labs Lab 10/27/12 0516 10/28/12 0610 10/30/12 0815 11/01/12 0932  NA 133* 134* 130* 129*  K 3.5 3.4* 3.7 4.1  CL 99 97 94* 94*  CO2 25 27 26 26   GLUCOSE 114* 119* 105* 99  BUN 7 6 9 6   CREATININE 0.59 0.55 0.61 0.60  CALCIUM 8.0* 8.5 8.7 8.9   Liver Function Tests:  Recent Labs Lab 10/27/12 0516  AST 47*  ALT 33  ALKPHOS 95  BILITOT 0.6  PROT 6.4  ALBUMIN 2.6*   CBC:  Recent Labs Lab 10/28/12 0610 10/30/12 0815 10/31/12 1416 11/01/12 0640 11/02/12 1117  WBC 5.6 6.1 6.4 6.9 6.0  NEUTROABS 2.5 2.5 3.6 3.1 2.8  HGB 11.0* 10.7* 11.3* 11.0* 12.1*  HCT 30.6* 29.6* 31.8* 30.2* 33.4*  MCV 98.4 97.4 99.4 97.4 98.2  PLT 83* 122* 162 180 215       Signed:  Conley Canal [phone] [facility] 11/02/2012, 3:06 PM   Patient seen and examined. Agree with above note.Will be discharged home today in stable condition.  Peggye Pitt, MD [facility] Pager: [phone]

## 2012-11-02 NOTE — Progress Notes (Signed)
Patient discharge teaching given, including activity, diet, follow-up appoints, and medications. Patient verbalized understanding of all discharge instructions. IV access was d/c'd. Vitals are stable. Skin is intact except as charted in most recent assessments. Pt to be escorted out by NT, to be driven home by family. 

## 2012-11-02 NOTE — Anesthesia Preprocedure Evaluation (Addendum)
Anesthesia Evaluation  Patient identified by MRN, date of birth, ID band Patient awake    Reviewed: Allergy & Precautions, H&P , NPO status , Patient's Chart, lab work & pertinent test results  Airway Mallampati: II TM Distance: >3 FB Neck ROM: Full    Dental no notable dental hx. (+) Poor Dentition and Dental Advisory Given   Pulmonary neg pulmonary ROS,  breath sounds clear to auscultation  Pulmonary exam normal       Cardiovascular hypertension, On Medications and On Home Beta Blockers - pacemakerRhythm:Regular Rate:Normal     Neuro/Psych Seizures -, Well Controlled,  PSYCHIATRIC DISORDERS    GI/Hepatic negative GI ROS, Neg liver ROS,   Endo/Other  negative endocrine ROS  Renal/GU negative Renal ROS  negative genitourinary   Musculoskeletal   Abdominal   Peds  Hematology negative hematology ROS (+)   Anesthesia Other Findings   Reproductive/Obstetrics negative OB ROS                         Anesthesia Physical Anesthesia Plan  ASA: II  Anesthesia Plan: General   Post-op Pain Management:    Induction: Intravenous  Airway Management Planned: Oral ETT  Additional Equipment:   Intra-op Plan:   Post-operative Plan: Extubation in OR  Informed Consent: I have reviewed the patients History and Physical, chart, labs and discussed the procedure including the risks, benefits and alternatives for the proposed anesthesia with the patient or authorized representative who has indicated his/her understanding and acceptance.   Dental advisory given  Plan Discussed with: CRNA  Anesthesia Plan Comments:        Anesthesia Quick Evaluation

## 2012-11-02 NOTE — Op Note (Signed)
10/25/2012 - 11/02/2012  7:56 AM  PATIENT:  [name]    PRE-OPERATIVE DIAGNOSIS:  septic non-union left elbow, left  POST-OPERATIVE DIAGNOSIS:  Same  PROCEDURE:  INCISION AND DRAINAGE ABSCESS  SURGEON:  Anaia Frith, D, MD  ASSISTANT: None  ANESTHESIA:   Gen.  PREOPERATIVE INDICATIONS:  [name] [name] is a  52 y.o. male with a diagnosis of septic non-union left elbow, left who failed conservative measures and elected for surgical management.    The risks benefits and alternatives were discussed with the patient preoperatively including but not limited to the risks of infection, bleeding, nerve injury, cardiopulmonary complications, the need for revision surgery, among others, and the patient was willing to proceed.  OPERATIVE IMPLANTS: None  OPERATIVE FINDINGS: Will appeared clean with no. Fluid no necrotic tissue.  OPERATIVE PROCEDURE:  Patient was identified in the preoperative holding area and site was marked by me He was transported to the operating theater and placed on the table in supine position taking care to pad all bony prominences. After a preincinduction time out anesthesia was induced. The left upper extremity was prepped and draped in normal sterile fashion and a pre-incision timeout was performed he received Ancef for preoperative antibiotics. I have removed wound VAC in stitches and performed a thorough irrigation of the wound with overlayer of saline. Probed into the joint it was still without purulent fluid. All tissue was healthy-appearing granulation tissue throughout the wound edges. After thorough irrigation no necrotic tissue was found to be debrided. I performed a wound closure using PDS retention stitches followed by interrupted stitches there was little to no tension on the wound. Sterile dressing was applied he was placed back in a splint.  POST OPERATIVE PLAN: DT prophylaxis will be continued per the medicine team. We will plan for PICC line  placement of antibiotic status and followup at [facility] as arranged for definitive management of his septic nonunion.

## 2012-11-02 NOTE — Preoperative (Signed)
Beta Blockers   Reason not to administer Beta Blockers:Not Applicable 

## 2012-11-02 NOTE — Interval H&P Note (Signed)
History and Physical Interval Note:  11/02/2012 6:56 AM  [name] [name]  has presented today for surgery, with the diagnosis of L ELBOW ABSCESS  The various methods of treatment have been discussed with the patient and family. After consideration of risks, benefits and other options for treatment, the patient has consented to  Procedure(s): INCISION AND DRAINAGE ABSCESS (Left) as a surgical intervention .  The patient's history has been reviewed, patient examined, no change in status, stable for surgery.  I have reviewed the patient's chart and labs.  Questions were answered to the patient's satisfaction.     MURPHY, TIMOTHY, D

## 2012-11-02 NOTE — Progress Notes (Signed)
Brief Nutrition Note:  RD pulled to pt for positive malnutrition screening tool. Pt indicated "unsure" of unintentional weight loss. Denied poor appetite. States he is not getting enough to eat.   No new weight on file since admission but pt has been eating 100% of all meals. Weighed pt in bed, 191 lbs.   Body mass index is 20.87 kg/(m^2). WNL Wt Readings from Last 10 Encounters:  10/25/12 180 lb 8.9 oz (81.9 kg)  10/25/12 180 lb 8.9 oz (81.9 kg)  10/25/12 180 lb 8.9 oz (81.9 kg)  10/25/12 180 lb 8.9 oz (81.9 kg)  12/28/11 195 lb (88.451 kg)     Pt states he has lost a lot of weight over many years. But pt also seemed a little confused. Pt s/p surgery on his elbow this morning, possibly related to medications?   Pt agreeable to snacks between meals. Chart reviewed, no additional nutrition interventions warranted, please consult as needed.   Clarene Duke RD, LDN Pager [phone] After Hours pager [phone]

## 2012-11-02 NOTE — H&P (View-Only) (Signed)
  Subjective:  Patient reports pain as mild.    Objective:   VITALS:   Filed Vitals:   10/29/12 1448 10/29/12 2229 10/30/12 0515 10/30/12 1425  BP: 133/82 134/80 152/87 124/70  Pulse: 77 79 68 75  Temp: 98.5 F (36.9 C) 98.9 F (37.2 C) 99.3 F (37.4 C) 98.6 F (37 C)  TempSrc: Oral Oral Oral Oral  Resp: 20 16 16 20  Height:      Weight:      SpO2: 97% 95% 99% 93%    LUE:  Dressing: C/D/I  Compartments soft  SILT M/R/U, 2+rad puls, +EPL/FPL/IO   LABS  Results for orders placed during the hospital encounter of 10/25/12 (from the past 24 hour(s))  CBC WITH DIFFERENTIAL     Status: Abnormal   Collection Time    10/30/12  8:15 AM      Result Value Range   WBC 6.1  4.0 - 10.5 K/uL   RBC 3.04 (*) 4.22 - 5.81 MIL/uL   Hemoglobin 10.7 (*) 13.0 - 17.0 g/dL   HCT 29.6 (*) 39.0 - 52.0 %   MCV 97.4  78.0 - 100.0 fL   MCH 35.2 (*) 26.0 - 34.0 pg   MCHC 36.1 (*) 30.0 - 36.0 g/dL   RDW 12.8  11.5 - 15.5 %   Platelets 122 (*) 150 - 400 K/uL   Neutrophils Relative % 41 (*) 43 - 77 %   Neutro Abs 2.5  1.7 - 7.7 K/uL   Lymphocytes Relative 47 (*) 12 - 46 %   Lymphs Abs 2.9  0.7 - 4.0 K/uL   Monocytes Relative 12  3 - 12 %   Monocytes Absolute 0.7  0.1 - 1.0 K/uL   Eosinophils Relative 0  0 - 5 %   Eosinophils Absolute 0.0  0.0 - 0.7 K/uL   Basophils Relative 0  0 - 1 %   Basophils Absolute 0.0  0.0 - 0.1 K/uL  BASIC METABOLIC PANEL     Status: Abnormal   Collection Time    10/30/12  8:15 AM      Result Value Range   Sodium 130 (*) 135 - 145 mEq/L   Potassium 3.7  3.5 - 5.1 mEq/L   Chloride 94 (*) 96 - 112 mEq/L   CO2 26  19 - 32 mEq/L   Glucose, Bld 105 (*) 70 - 99 mg/dL   BUN 9  6 - 23 mg/dL   Creatinine, Ser 0.61  0.50 - 1.35 mg/dL   Calcium 8.7  8.4 - 10.5 mg/dL   GFR calc non Af Amer >90  >90 mL/min   GFR calc Af Amer >90  >90 mL/min  CARBAMAZEPINE LEVEL, TOTAL     Status: None   Collection Time    10/30/12  2:34 PM      Result Value Range   Carbamazepine  Lvl 11.1  4.0 - 12.0 ug/mL     Assessment/Plan: 1 Day Post-Op   Principal Problem:   Septic joint of left elbow Active Problems:   History of alcohol abuse   Chronic liver disease   Thrombocytopenia   Hepatitis C   PLAN: Plan for repeat I&D and wound closure on Friday at 7:15 NWB LUE Please hold VTE px on Friday mornig VTE prophylaxis: SCD, Heparin  DISPO: will need pic line and long term Abx. I have arranged for patient to follow up a [facility] for definitive treatment of his septic nonunion. He should follow up   there as soon as possible.   Takhia Spoon, D 10/30/2012, 9:54 PM   Edenilson Austad, MD Cell [phone]    

## 2012-11-05 ENCOUNTER — Encounter (HOSPITAL_COMMUNITY): Payer: Self-pay | Admitting: Orthopedic Surgery

## 2012-11-08 DIAGNOSIS — M869 Osteomyelitis, unspecified: Secondary | ICD-10-CM | POA: Insufficient documentation

## 2012-11-08 DIAGNOSIS — S52023A Displaced fracture of olecranon process without intraarticular extension of unspecified ulna, initial encounter for closed fracture: Secondary | ICD-10-CM | POA: Insufficient documentation

## 2012-11-16 NOTE — ED Provider Notes (Signed)
Medical screening examination/treatment/procedure(s) were performed by non-physician practitioner and as supervising physician I was immediately available for consultation/collaboration.    Nelia Shi, MD 11/16/12 2312

## 2012-11-28 ENCOUNTER — Ambulatory Visit (INDEPENDENT_AMBULATORY_CARE_PROVIDER_SITE_OTHER): Payer: Medicaid Other | Admitting: Infectious Disease

## 2012-11-28 ENCOUNTER — Encounter: Payer: Self-pay | Admitting: Infectious Disease

## 2012-11-28 VITALS — BP 143/90 | HR 86 | Temp 98.8°F | Ht 77.0 in | Wt 201.0 lb

## 2012-11-28 DIAGNOSIS — G40909 Epilepsy, unspecified, not intractable, without status epilepticus: Secondary | ICD-10-CM

## 2012-11-28 DIAGNOSIS — A4902 Methicillin resistant Staphylococcus aureus infection, unspecified site: Secondary | ICD-10-CM

## 2012-11-28 DIAGNOSIS — T889XXS Complication of surgical and medical care, unspecified, sequela: Secondary | ICD-10-CM

## 2012-11-28 DIAGNOSIS — M009 Pyogenic arthritis, unspecified: Secondary | ICD-10-CM

## 2012-11-28 DIAGNOSIS — A499 Bacterial infection, unspecified: Secondary | ICD-10-CM

## 2012-11-28 DIAGNOSIS — T847XXS Infection and inflammatory reaction due to other internal orthopedic prosthetic devices, implants and grafts, sequela: Secondary | ICD-10-CM

## 2012-11-28 DIAGNOSIS — I10 Essential (primary) hypertension: Secondary | ICD-10-CM

## 2012-11-28 MED ORDER — DOXYCYCLINE HYCLATE 100 MG PO TABS: 100.0000 mg | ORAL_TABLET | Freq: Two times a day (BID) | ORAL | Status: DC

## 2012-11-28 MED ORDER — DOXYCYCLINE HYCLATE 50 MG PO CAPS: ORAL_CAPSULE | ORAL | Status: DC

## 2012-11-28 MED ORDER — CARBAMAZEPINE 200 MG PO TABS: 400.0000 mg | ORAL_TABLET | Freq: Three times a day (TID) | ORAL | Status: DC

## 2012-11-28 NOTE — Progress Notes (Signed)
Subjective:    Patient ID: [name] [name], male    DOB: [birth year], 52 y.o.   MRN: [id number]  HPI   52 y.o. 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 He had been on IV vancomycin postoperatively but a safe place for him to go with IV antibiotics was not foreseeable and he was then changed instead to oral doxycycline under milligrams twice daily which she's remained on since discharge the hospital. He has apparently been seen by one of the hand surgeon at [facility] ([place]) although I cannot pull up any notes but see that films were done that showed:  1. 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 Heterotopic ossification/dystrophic calcifications at the triceps insertion which also may be postsurgical.  2. Mild erosive changes in the olecranon at the triceps insertion underlying a posttraumatic or postsurgical skin/soft tissue ulcer/defect. This may represent postsurgical changes, though the combination of clinical history and chronic periostitis suggests chronic infection, while the erosions at the triceps insertion may reflect any and/or all of the following: expected post-surgical change (no prior examinations for comparison); superimposed acute on chronic osteomyelitis; and erosive triceps insertional enthesopathy. Nuclear medicine bone scan with indium tagged white blood cells or MRI could further evaluate this region if this will affect clinical management. MRI may be limited due to micrometallic artifact from reported recent removal of hardware, which may result in micrometallic artifact and marrow edema in the region of interest.   3. Mild olecranon bursitis.  4. Elbow joint effusion,   He was referred back to [place] for  continued followup of his septic arthritis. He says pain is better but still "about a 10/10" in severity. The patient also is also about to run out of his antiseizure medicine Tegretol and asked that we fill this along with narcotics. I was willing to give him a one-time prescription of Tegretol since he is about to  run out of this, but refused to handle his pain prescriptions beyond giving him some ibuprofen. The patient clearly needs to be seen by a primary care physician. [facility] Is not clear to me why hospital followup range has not made be seen in such a clinic. This will now need to be arranged and we have tried directed to this clinic    Review of Systems  Constitutional: Negative for fever, chills, diaphoresis, activity change, appetite change, fatigue and unexpected weight change.  HENT: Negative for congestion, sore throat, rhinorrhea, sneezing, trouble swallowing and sinus pressure.   Eyes: Negative for photophobia and visual disturbance.  Respiratory: Negative for cough, chest tightness, shortness of breath, wheezing and stridor.   Cardiovascular: Negative for chest pain, palpitations and leg swelling.  Gastrointestinal: Negative for nausea, vomiting, abdominal pain, diarrhea, constipation, blood in stool, abdominal distention and anal bleeding.  Genitourinary: Negative for dysuria, hematuria, flank pain and difficulty urinating.  Musculoskeletal: Positive for myalgias, joint swelling and arthralgias. Negative for back pain and gait problem.  Skin: Positive for wound. Negative for color change, pallor and rash.  Neurological: Negative for dizziness, tremors, weakness and light-headedness.  Hematological: Negative for adenopathy. Does not bruise/bleed easily.  Psychiatric/Behavioral: Negative for behavioral problems, confusion, sleep disturbance, dysphoric mood, decreased concentration and agitation.  Objective:   Physical Exam    Constitutional: He is oriented to person, place, and time. He appears well-developed and well-nourished. No distress.  HENT:  Head: Normocephalic and atraumatic.  Eyes: Conjunctivae and EOM are normal.  Neck: Normal range of motion. Neck supple.  Cardiovascular: Normal rate and regular rhythm.   Pulmonary/Chest: Effort normal. He has no wheezes.  Abdominal: He exhibits no distension.  Musculoskeletal: He exhibits no edema and no tenderness.       Arms: Neurological: He is alert and oriented to person, place, and time. He exhibits normal muscle tone. Coordination normal.  Skin: Skin is warm and dry. He is not diaphoretic. No erythema. No pallor.  Psychiatric: Judgment and thought content normal. His affect is inappropriate. His speech is not rapid and/or pressured. He is agitated.          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  Seizure disorder: Needs to be seen by primary care physician and neurologist I will give the one-time prescription for Tegretol.  Pain: Needs to be followed by a primary care physician  Hypertension again needs a primary care physician.

## 2012-12-04 ENCOUNTER — Inpatient Hospital Stay: Payer: Medicaid Other | Admitting: Internal Medicine

## 2012-12-31 ENCOUNTER — Ambulatory Visit: Payer: Medicaid Other | Admitting: Infectious Disease

## 2013-08-29 ENCOUNTER — Emergency Department (HOSPITAL_COMMUNITY): Payer: Medicaid Other

## 2013-08-29 ENCOUNTER — Encounter (HOSPITAL_COMMUNITY): Payer: Self-pay | Admitting: Emergency Medicine

## 2013-08-29 ENCOUNTER — Emergency Department (HOSPITAL_COMMUNITY)
Admission: EM | Admit: 2013-08-29 | Discharge: 2013-08-30 | Disposition: A | Discharge status: Home / Self Care | Payer: Medicaid Other | Attending: Emergency Medicine | Admitting: Emergency Medicine

## 2013-08-29 DIAGNOSIS — R892 Abnormal level of other drugs, medicaments and biological substances in specimens from other organs, systems and tissues: Secondary | ICD-10-CM | POA: Insufficient documentation

## 2013-08-29 DIAGNOSIS — F17200 Nicotine dependence, unspecified, uncomplicated: Secondary | ICD-10-CM | POA: Insufficient documentation

## 2013-08-29 DIAGNOSIS — Z5900 Homelessness unspecified: Secondary | ICD-10-CM | POA: Insufficient documentation

## 2013-08-29 DIAGNOSIS — D696 Thrombocytopenia, unspecified: Secondary | ICD-10-CM | POA: Insufficient documentation

## 2013-08-29 DIAGNOSIS — F1410 Cocaine abuse, uncomplicated: Secondary | ICD-10-CM | POA: Insufficient documentation

## 2013-08-29 DIAGNOSIS — F1020 Alcohol dependence, uncomplicated: Secondary | ICD-10-CM | POA: Insufficient documentation

## 2013-08-29 DIAGNOSIS — G40909 Epilepsy, unspecified, not intractable, without status epilepticus: Secondary | ICD-10-CM | POA: Insufficient documentation

## 2013-08-29 DIAGNOSIS — I10 Essential (primary) hypertension: Secondary | ICD-10-CM | POA: Insufficient documentation

## 2013-08-29 DIAGNOSIS — Z79899 Other long term (current) drug therapy: Secondary | ICD-10-CM | POA: Insufficient documentation

## 2013-08-29 DIAGNOSIS — Z8619 Personal history of other infectious and parasitic diseases: Secondary | ICD-10-CM | POA: Insufficient documentation

## 2013-08-29 DIAGNOSIS — F10929 Alcohol use, unspecified with intoxication, unspecified: Secondary | ICD-10-CM

## 2013-08-29 DIAGNOSIS — R569 Unspecified convulsions: Secondary | ICD-10-CM

## 2013-08-29 LAB — URINALYSIS, ROUTINE W REFLEX MICROSCOPIC
Bilirubin Urine: NEGATIVE
Glucose, UA: NEGATIVE mg/dL
HGB URINE DIPSTICK: NEGATIVE
Ketones, ur: NEGATIVE mg/dL
Leukocytes, UA: NEGATIVE
Nitrite: NEGATIVE
Protein, ur: NEGATIVE mg/dL
SPECIFIC GRAVITY, URINE: 1.005 (ref 1.005–1.030)
UROBILINOGEN UA: 1 mg/dL (ref 0.0–1.0)
pH: 5.5 (ref 5.0–8.0)

## 2013-08-29 LAB — CBC WITH DIFFERENTIAL/PLATELET
Basophils Absolute: 0 10*3/uL (ref 0.0–0.1)
Basophils Relative: 0 % (ref 0–1)
EOS PCT: 0 % (ref 0–5)
Eosinophils Absolute: 0 10*3/uL (ref 0.0–0.7)
HCT: 34.5 % — ABNORMAL LOW (ref 39.0–52.0)
Hemoglobin: 12 g/dL — ABNORMAL LOW (ref 13.0–17.0)
LYMPHS ABS: 3.1 10*3/uL (ref 0.7–4.0)
Lymphocytes Relative: 55 % — ABNORMAL HIGH (ref 12–46)
MCH: 35 pg — ABNORMAL HIGH (ref 26.0–34.0)
MCHC: 34.8 g/dL (ref 30.0–36.0)
MCV: 100.6 fL — AB (ref 78.0–100.0)
MONOS PCT: 7 % (ref 3–12)
Monocytes Absolute: 0.4 10*3/uL (ref 0.1–1.0)
NEUTROS PCT: 38 % — AB (ref 43–77)
Neutro Abs: 2.1 10*3/uL (ref 1.7–7.7)
PLATELETS: 69 10*3/uL — AB (ref 150–400)
RBC: 3.43 MIL/uL — AB (ref 4.22–5.81)
RDW: 15.3 % (ref 11.5–15.5)
WBC: 5.6 10*3/uL (ref 4.0–10.5)

## 2013-08-29 LAB — BASIC METABOLIC PANEL
BUN: 6 mg/dL (ref 6–23)
CALCIUM: 8.3 mg/dL — AB (ref 8.4–10.5)
CO2: 24 mEq/L (ref 19–32)
CREATININE: 0.77 mg/dL (ref 0.50–1.35)
Chloride: 97 mEq/L (ref 96–112)
GLUCOSE: 113 mg/dL — AB (ref 70–99)
Potassium: 4 mEq/L (ref 3.7–5.3)
Sodium: 135 mEq/L — ABNORMAL LOW (ref 137–147)

## 2013-08-29 LAB — RAPID URINE DRUG SCREEN, HOSP PERFORMED
Amphetamines: NOT DETECTED
Barbiturates: NOT DETECTED
Benzodiazepines: NOT DETECTED
Cocaine: POSITIVE — AB
OPIATES: NOT DETECTED
TETRAHYDROCANNABINOL: NOT DETECTED

## 2013-08-29 LAB — HEPATIC FUNCTION PANEL
ALT: 71 U/L — ABNORMAL HIGH (ref 0–53)
AST: 146 U/L — AB (ref 0–37)
Albumin: 3.5 g/dL (ref 3.5–5.2)
Alkaline Phosphatase: 102 U/L (ref 39–117)
BILIRUBIN DIRECT: 0.3 mg/dL (ref 0.0–0.3)
BILIRUBIN INDIRECT: 0.7 mg/dL (ref 0.3–0.9)
BILIRUBIN TOTAL: 1 mg/dL (ref 0.3–1.2)
Total Protein: 7.6 g/dL (ref 6.0–8.3)

## 2013-08-29 LAB — CARBAMAZEPINE LEVEL, TOTAL: CARBAMAZEPINE LVL: 1.2 ug/mL — AB (ref 4.0–12.0)

## 2013-08-29 LAB — PROTIME-INR
INR: 1.27 (ref 0.00–1.49)
PROTHROMBIN TIME: 15.6 s — AB (ref 11.6–15.2)

## 2013-08-29 LAB — ETHANOL: Alcohol, Ethyl (B): 169 mg/dL — ABNORMAL HIGH (ref 0–11)

## 2013-08-29 MED ORDER — CARBAMAZEPINE 200 MG PO TABS
400.0000 mg | ORAL_TABLET | Freq: Once | ORAL | Status: AC
  Administered 2013-08-29: 400 mg via ORAL
  Filled 2013-08-29: qty 2

## 2013-08-29 NOTE — ED Notes (Signed)
Pt states that he came in here because he had a seizure.  Came in with a "nurse" who knows him.  Visitor is very overbearing about how pt needs to be admitted because he needs to go to a nursing home.  Pt is A&O x 4.  Ambulates independently.  Pt very angry with this writer when told that he will probably not meet admission criteria based on his complaints today.  Pt takes tegretol.  Visitor states that she has talked with [facility] about possibly taking patient.

## 2013-08-29 NOTE — ED Provider Notes (Signed)
CSN: [id number]     Arrival date & time 08/29/13  1523 History   First MD Initiated Contact with Patient 08/29/13 1850     Chief Complaint  Patient presents with  . Seizures  . Homeless     (Consider location/radiation/quality/duration/timing/severity/associated sxs/prior Treatment) HPI  [name] [name] is a 53 y.o. male complaining of seizure this afternoon. States that the event was witnessed but is not sure how long it lasted. The person that witnessed the event is not here in the emergency room. Denies any loss of bowel or bladder control. That is been compliant with his Tegretol. States he has been vomiting daily for the last several weeks. States it is intermittent and sometimes able to keep food down. He denies chest pain, shortness of breath, fever or, abdominal pain, change in bowel or bladder habits. Patient states that he has hepatitis C and hypertension but cannot afford his medications for these. Requesting admission to the hospital for nursing home placement. Patient is homeless. 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  Past Medical History  Diagnosis Date  . Seizure   . ETOHism   . Aneurysm    Past Surgical History  Procedure Laterality Date  . Cerebral aneurysm repair      At [facility]  . I&d extremity Left 10/26/2012    Procedure: IRRIGATION AND DEBRIDEMENT Left Elbow;  Surgeon: Sheral Apley, MD;  Location: [facility] OR;  Service: Orthopedics;  Laterality: Left;  . I&d extremity Left 10/29/2012    Procedure: IRRIGATION AND DEBRIDEMENT EXTREMITY, wound vac change, stimulan beads;  Surgeon: Sheral Apley, MD;  Location: [facility] OR;  Service: Orthopedics;  Laterality: Left;  lateral on bean bag  . Hardware removal Left 10/29/2012    Procedure: HARDWARE REMOVAL;  Surgeon: Sheral Apley, MD;  Location: [facility] OR;  Service: Orthopedics;  Laterality: Left;  . Incision and drainage abscess Left 11/02/2012    Procedure: INCISION AND DRAINAGE ABSCESS;   Surgeon: Sheral Apley, MD;  Location: [facility] OR;  Service: Orthopedics;  Laterality: Left;  . I&d extremity Left 11/02/2012    Procedure: IRRIGATION AND DEBRIDEMENT EXTREMITY;  Surgeon: Sheral Apley, MD;  Location: [facility] OR;  Service: Orthopedics;  Laterality: Left;   Family History  Problem Relation Age of Onset  . Hypertension Mother    History  Substance Use Topics  . Smoking status: Current Some Day Smoker    Types: Cigarettes  . Smokeless tobacco: Never Used  . Alcohol Use: No     Comment: quit drinking 11/2011 per patient    Review of Systems  10 systems reviewed and found to be negative, except as noted in the HPI.   Allergies  Review of patient's allergies indicates no known allergies.  Home Medications   Prior to Admission medications   Medication Sig Start Date End Date Taking? Authorizing Provider  carbamazepine (TEGRETOL) 200 MG tablet Take 2 tablets (400 mg total) by mouth 3 (three) times daily. 11/28/12  Yes Randall Hiss, MD   BP 143/76  Pulse 86  Temp(Src) 99 F (37.2 C) (Oral)  Resp 16  SpO2 100% Physical Exam  Nursing note and vitals reviewed. Constitutional: He is oriented to person, place, and time. He appears well-developed and well-nourished. No distress.  HENT:  Head: Normocephalic and atraumatic.  Mouth/Throat: Oropharynx is clear and moist.  No signs of head trauma, no intraoral lacerations.  Eyes: Conjunctivae and EOM are normal. Pupils are equal, round,  and reactive to light.  Neck: Normal range of motion. Neck supple.  No midline C-spine  tenderness to palpation or step-offs appreciated. Patient has full range of motion without pain.   Cardiovascular: Normal rate, regular rhythm and intact distal pulses.   Pulmonary/Chest: Effort normal and breath sounds normal. No stridor.  Abdominal: Soft. Bowel sounds are normal. He exhibits no distension and no mass. There is no tenderness. There is no rebound and no guarding.  Musculoskeletal:  Normal range of motion. He exhibits no edema and no tenderness.  Neurological: He is alert and oriented to person, place, and time.  Skin: Skin is warm.  Psychiatric: He has a normal mood and affect.    ED Course  Procedures (including critical care time) Labs Review Labs Reviewed  URINE RAPID DRUG SCREEN (HOSP PERFORMED) - Abnormal; Notable for the following:    Cocaine POSITIVE (*)    All other components within normal limits  CBC WITH DIFFERENTIAL - Abnormal; Notable for the following:    RBC 3.43 (*)    Hemoglobin 12.0 (*)    HCT 34.5 (*)    MCV 100.6 (*)    MCH 35.0 (*)    Platelets 69 (*)    Neutrophils Relative % 38 (*)    Lymphocytes Relative 55 (*)    All other components within normal limits  BASIC METABOLIC PANEL - Abnormal; Notable for the following:    Sodium 135 (*)    Glucose, Bld 113 (*)    Calcium 8.3 (*)    All other components within normal limits  HEPATIC FUNCTION PANEL - Abnormal; Notable for the following:    AST 146 (*)    ALT 71 (*)    All other components within normal limits  PROTIME-INR - Abnormal; Notable for the following:    Prothrombin Time 15.6 (*)    All other components within normal limits  CARBAMAZEPINE LEVEL, TOTAL - Abnormal; Notable for the following:    Carbamazepine Lvl 1.2 (*)    All other components within normal limits  ETHANOL - Abnormal; Notable for the following:    Alcohol, Ethyl (B) 169 (*)    All other components within normal limits  URINALYSIS, ROUTINE W REFLEX MICROSCOPIC    Imaging Review Ct Head Wo Contrast  08/29/2013   CLINICAL DATA:  Seizure. History of aneurysm and ethanol intoxication.  EXAM: CT HEAD WITHOUT CONTRAST  TECHNIQUE: Contiguous axial images were obtained from the base of the skull through the vertex without intravenous contrast.  COMPARISON:  01/14/2012.  FINDINGS: Encephalomalacia is present in both inferior frontal lobes which may related to prior are aneurysm or previous trauma. Age advanced  atrophy. No mass lesion, mass effect, midline shift, hydrocephalus, hemorrhage. No territorial ischemia or acute infarction. Mastoid air cells clear. Rightward nasal septal deviation and septal spur. Calvarium is intact. No evidence of prior craniotomy. Scout images appear within normal limits. No aneurysm clip is identified.  IMPRESSION: 1. No acute intracranial abnormality. 2. Inferior bifrontal encephalomalacia, most commonly associated with trauma.   Electronically Signed   By: Andreas NewportGeoffrey  Lamke M.D.   On: 08/29/2013 19:39     EKG Interpretation None      MDM   Final diagnoses:  Convulsions/seizures  Alcohol intoxication  Seizure secondary to subtherapeutic anticonvulsant medication  Thrombocytopenia    Filed Vitals:   08/29/13 1535 08/29/13 1849 08/29/13 2024 08/29/13 2348  BP: 130/85 139/85 120/81 143/76  Pulse: 83 80 84 86  Temp: 99 F (37.2 C) 99 F (  37.2 C)    TempSrc: Oral Oral    Resp: 18 18 20 16   SpO2: 95% 96% 100% 100%    Medications  carbamazepine (TEGRETOL) tablet 400 mg (400 mg Oral Given 08/29/13 2201)    Calob Baskette is a 53 y.o. male presenting with reported seizure prior to arrival. Patient states he's been compliant with his medications. Patient is homeless and has not had his high blood pressure or hepatitis C medications because he cannot afford them. Patient is mentating at his baseline, Tegretol level found to be subtherapeutic. But were otherwise unremarkable. Urine drug screen shows cocaine and ethanol level is 169. Head CT without acute abnormality. Neurology consult from Dr. Vonita Moss appreciated, he recommends doubling the dose of the Tegretol of the next 24 hours. Patient will be given the first dose in the ED, we've had extensive discussions on how to appropriately take the medication went to switch back to his normal dose. Patient has repeated this instructions demonstrating understanding. Advised him that it is very important he sits up care with  the wellness Center, they will help him obtain medications for old his chronic medical issues. Patient verbalized his understanding. Patient is not need prescription for Tegretol, he produces a full bottle. Ambulating without assistance and tolerating by mouth at discharge  Evaluation does not show pathology that would require ongoing emergent intervention or inpatient treatment. Pt is hemodynamically stable and mentating appropriately. Discussed findings and plan with patient/guardian, who agrees with care plan. All questions answered. Return precautions discussed and outpatient follow up given.    Joni Reining Jerone Cudmore, PA-C 08/30/13 0200

## 2013-08-29 NOTE — Discharge Instructions (Signed)
You  will need to double your Tegretol dose for the morning and noon doses tomorrow, then, resume your regular dose of 200 mg 3 times a day. This means that tomorrow night he will take her normal dose of 200 mg.  Do not hesitate to return to the E Epilepsy Epilepsy is a disorder in which a person has repeated seizures over time. A seizure is a release of abnormal electrical activity in the brain. Seizures can cause a change in attention, behavior, or the ability to remain awake and alert (altered mental status). Seizures often involve uncontrollable shaking (convulsions).  Most people with epilepsy lead normal lives. However, people with epilepsy are at an increased risk of falls, accidents, and injuries. Therefore, it is important to begin treatment right away. CAUSES  Epilepsy has many possible causes. Anything that disturbs the normal pattern of brain cell activity can lead to seizures. This may include:   Head injury.  Birth trauma.  High fever as a child.  Stroke.  Bleeding into or around the brain.  Certain drugs.  Prolonged low oxygen, such as what occurs after CPR efforts.  Abnormal brain development.  Certain illnesses, such as meningitis, encephalitis (brain infection), malaria, and other infections.  An imbalance of nerve signaling chemicals (neurotransmitters).  SIGNS AND SYMPTOMS  The symptoms of a seizure can vary greatly from one person to another. Right before a seizure, you may have a warning (aura) that a seizure is about to occur. An aura may include the following symptoms:  Fear or anxiety.  Nausea.  Feeling like the room is spinning (vertigo).  Vision changes, such as seeing flashing lights or spots. Common symptoms during a seizure include:  Abnormal sensations, such as an abnormal smell or a bitter taste in the mouth.   Sudden, general body stiffness.   Convulsions that involve rhythmic jerking of the face, arm, or leg on one or both sides.    Sudden change in consciousness.   Appearing to be awake but not responding.   Appearing to be asleep but cannot be awakened.   Grimacing, chewing, lip smacking, drooling, tongue biting, or loss of bowel or bladder control. After a seizure, you may feel sleepy for a while. DIAGNOSIS  Your health care provider will ask about your symptoms and take a medical history. Descriptions from any witnesses to your seizures will be very helpful in the diagnosis. A physical exam, including a detailed neurological exam, is necessary. Various tests may be done, such as:   An electroencephalogram (EEG). This is a painless test of your brain waves. In this test, a diagram is created of your brain waves. These diagrams can be interpreted by a specialist.  An MRI of the brain.   A CT scan of the brain.   A spinal tap (lumbar puncture, LP).  Blood tests to check for signs of infection or abnormal blood chemistry. TREATMENT  There is no cure for epilepsy, but it is generally treatable. Once epilepsy is diagnosed, it is important to begin treatment as soon as possible. For most people with epilepsy, seizures can be controlled with medicines. The following may also be used:  A pacemaker for the brain (vagus nerve stimulator) can be used for people with seizures that are not well controlled by medicine.  Surgery on the brain. For some people, epilepsy eventually goes away. HOME CARE INSTRUCTIONS   Follow your health care provider's recommendations on driving and safety in normal activities.  Get enough rest. Lack of sleep  can cause seizures.  Only take over-the-counter or prescription medicines as directed by your health care provider. Take any prescribed medicine exactly as directed.  Avoid any known triggers of your seizures.  Keep a seizure diary. Record what you recall about any seizure, especially any possible trigger.   Make sure the people you live and work with know that you are  prone to seizures. They should receive instructions on how to help you. In general, a witness to a seizure should:   Cushion your head and body.   Turn you on your side.   Avoid unnecessarily restraining you.   Not place anything inside your mouth.   Call for emergency medical help if there is any question about what has occurred.   Follow up with your health care provider as directed. You may need regular blood tests to monitor the levels of your medicine.  SEEK MEDICAL CARE IF:   You develop signs of infection or other illness. This might increase the risk of a seizure.   You seem to be having more frequent seizures.   Your seizure pattern is changing.  SEEK IMMEDIATE MEDICAL CARE IF:   You have a seizure that does not stop after a few moments.   You have a seizure that causes any difficulty in breathing.   You have a seizure that results in a very severe headache.   You have a seizure that leaves you with the inability to speak or use a part of your body.  Document Released: 03/07/2005 Document Revised: 12/26/2012 Document Reviewed: 10/17/2012 Exit[employer] Patient Information 2014 [employer] mergency Department for any new, worsening or concerning symptoms.   If you do not have a primary care doctor you can establish one at the   [facility]: [address] [place] [id number] [phone]  After you establish care. Let them know you were seen in the emergency room. They must obtain records for further management.

## 2013-09-04 ENCOUNTER — Ambulatory Visit: Payer: Medicaid Other | Attending: Internal Medicine | Admitting: Internal Medicine

## 2013-09-04 ENCOUNTER — Encounter: Payer: Self-pay | Admitting: Internal Medicine

## 2013-09-04 VITALS — BP 134/77 | HR 95 | Temp 98.2°F | Resp 20 | Ht 77.0 in | Wt 179.6 lb

## 2013-09-04 DIAGNOSIS — Z760 Encounter for issue of repeat prescription: Secondary | ICD-10-CM | POA: Insufficient documentation

## 2013-09-04 DIAGNOSIS — R569 Unspecified convulsions: Secondary | ICD-10-CM

## 2013-09-04 DIAGNOSIS — F1011 Alcohol abuse, in remission: Secondary | ICD-10-CM | POA: Insufficient documentation

## 2013-09-04 DIAGNOSIS — E871 Hypo-osmolality and hyponatremia: Secondary | ICD-10-CM | POA: Diagnosis not present

## 2013-09-04 DIAGNOSIS — F17200 Nicotine dependence, unspecified, uncomplicated: Secondary | ICD-10-CM | POA: Diagnosis not present

## 2013-09-04 DIAGNOSIS — I10 Essential (primary) hypertension: Secondary | ICD-10-CM | POA: Diagnosis not present

## 2013-09-04 MED ORDER — TRAMADOL HCL 50 MG PO TABS: 50.0000 mg | ORAL_TABLET | Freq: Four times a day (QID) | ORAL | Status: DC | PRN

## 2013-09-04 MED ORDER — METOPROLOL TARTRATE 50 MG PO TABS: 12.5000 mg | ORAL_TABLET | Freq: Two times a day (BID) | ORAL | Status: DC

## 2013-09-04 MED ORDER — LEVETIRACETAM 1000 MG PO TABS: 1000.0000 mg | ORAL_TABLET | Freq: Two times a day (BID) | ORAL | Status: DC

## 2013-09-04 MED ORDER — METOPROLOL TARTRATE 12.5 MG HALF TABLET: 12.5000 mg | ORAL_TABLET | Freq: Two times a day (BID) | ORAL | Status: DC

## 2013-09-04 NOTE — Progress Notes (Signed)
LCSW met with patient and support person. Patient has medical need for housing and is currently homeless. LCSW completed FL2 and faxed to 1 [name] for potential placement. Support person stated that if placement cannot be identified today that she would faciliate patient's placement in a shelter. LCSW will work with family to identify a suitable placement.  Christene Lye MSW, LCSW

## 2013-09-04 NOTE — Progress Notes (Signed)
Patient presents to establish care for seizures and HTN. States he's been out of lopressor for 2 months. Patient accompanied by friend, Reita ClicheRhonda Thomas Walker, who is helping patient find a residence. Patient is living in car at present and requesting FL2 form CC:  Daily Seizures HOH

## 2013-09-05 LAB — CARBAMAZEPINE LEVEL, TOTAL: CARBAMAZEPINE LVL: 2.2 ug/mL — AB (ref 4.0–12.0)

## 2013-09-05 NOTE — ED Provider Notes (Signed)
Medical screening examination/treatment/procedure(s) were performed by non-physician practitioner and as supervising physician I was immediately available for consultation/collaboration.   EKG Interpretation None       Ethelda ChickMartha K Linker, MD 09/05/13 [phone]

## 2013-10-01 ENCOUNTER — Telehealth: Payer: Self-pay | Admitting: Internal Medicine

## 2013-10-01 ENCOUNTER — Telehealth: Payer: Self-pay | Admitting: Emergency Medicine

## 2013-10-01 NOTE — Telephone Encounter (Signed)
Pt. Stated that he was informed by provider to return in 3 weeks for a visit, but it is not stated in his chart to f/u....Marland Kitchen.Marland Kitchen.please call patient and advised him of what he needs to do next.

## 2013-10-01 NOTE — Telephone Encounter (Signed)
Left message for pt to call clinic in regards to f/u appt

## 2013-10-12 NOTE — Progress Notes (Signed)
Patient ID: [name] [name], male   DOB: 1960/11/05, 53 y.o.   MRN: [id number]   CC: follow upon BP  HPI: Pt is 53 yo male with HTN, presenting for follow up. He reports feeling well, no chest pain or shortness of breath, no abd or urinary concerns, needs refill on medications.   No Known Allergies Past Medical History  Diagnosis Date  . Seizure   . ETOHism   . Aneurysm   . Hypertension    No current outpatient prescriptions on file prior to visit.   No current facility-administered medications on file prior to visit.   Family History  Problem Relation Age of Onset  . Hypertension Mother    History   Social History  . Marital Status: Single    Spouse Name: N/A    Number of Children: N/A  . Years of Education: N/A   Occupational History  . Not on file.   Social History Main Topics  . Smoking status: Current Some Day Smoker    Types: Cigarettes  . Smokeless tobacco: Never Used  . Alcohol Use: No     Comment: quit drinking 11/2011 per patient  . Drug Use: No  . Sexual Activity: Not Currently   Other Topics Concern  . Not on file   Social History Narrative  . No narrative on file    Review of Systems  Constitutional: Negative for fever, chills, diaphoresis, activity change, appetite change and fatigue.  HENT: Negative for ear pain, nosebleeds, congestion, facial swelling, rhinorrhea, neck pain, neck stiffness and ear discharge.   Eyes: Negative for pain, discharge, redness, itching and visual disturbance.  Respiratory: Negative for cough, choking, chest tightness, shortness of breath, wheezing and stridor.   Cardiovascular: Negative for chest pain, palpitations and leg swelling.  Gastrointestinal: Negative for abdominal distention.  Genitourinary: Negative for dysuria, urgency, frequency, hematuria, flank pain, decreased urine volume, difficulty urinating and dyspareunia.  Musculoskeletal: Negative for back pain, joint swelling, arthralgias and gait problem.   Neurological: Negative for dizziness, tremors, seizures, syncope, facial asymmetry, speech difficulty, weakness, light-headedness, numbness and headaches.  Hematological: Negative for adenopathy. Does not bruise/bleed easily.  Psychiatric/Behavioral: Negative for hallucinations, behavioral problems, confusion, dysphoric mood, decreased concentration and agitation.    Objective:   Filed Vitals:   09/04/13 1029  BP: 134/77  Pulse: 95  Temp: 98.2 F (36.8 C)  Resp: 20    Physical Exam  Constitutional: Appears well-developed and well-nourished. No distress.  CVS: RRR, S1/S2 +, no murmurs, no gallops, no carotid bruit.  Pulmonary: Effort and breath sounds normal, no stridor, rhonchi, wheezes, rales.  Abdominal: Soft. BS +,  no distension, tenderness, rebound or guarding.  Musculoskeletal: Normal range of motion. No edema and no tenderness.  Psychiatric: Normal mood and affect. Behavior, judgment, thought content normal.   Lab Results  Component Value Date   WBC 5.6 08/29/2013   HGB 12.0* 08/29/2013   HCT 34.5* 08/29/2013   MCV 100.6* 08/29/2013   PLT 69* 08/29/2013   Lab Results  Component Value Date   CREATININE 0.77 08/29/2013   BUN 6 08/29/2013   NA 135* 08/29/2013   K 4.0 08/29/2013   CL 97 08/29/2013   CO2 24 08/29/2013    No results found for this basename: HGBA1C   Lipid Panel  No results found for this basename: chol, trig, hdl, cholhdl, vldl, ldlcalc       Assessment and plan:   Patient Active Problem List   Diagnosis Date Noted  . Hyponatremia -  stable, may need repeat BMP in 6 months  11/01/2012  . Convulsions/seizures - stable per pt 10/31/2012  . Essential hypertension, benign - continue to monitor closely  10/31/2012  . History of alcohol abuse - denies actively drinking  10/26/2012

## 2013-10-25 ENCOUNTER — Other Ambulatory Visit: Payer: Self-pay | Admitting: Internal Medicine

## 2013-10-25 ENCOUNTER — Telehealth: Payer: Self-pay | Admitting: Internal Medicine

## 2013-10-25 ENCOUNTER — Other Ambulatory Visit: Payer: Self-pay | Admitting: Emergency Medicine

## 2013-10-25 MED ORDER — METOPROLOL TARTRATE 50 MG PO TABS: 12.5000 mg | ORAL_TABLET | Freq: Two times a day (BID) | ORAL | Status: DC

## 2013-10-25 MED ORDER — LEVETIRACETAM 1000 MG PO TABS: 1000.0000 mg | ORAL_TABLET | Freq: Two times a day (BID) | ORAL | Status: DC

## 2013-10-25 MED ORDER — CARBAMAZEPINE 200 MG PO TABS: 400.0000 mg | ORAL_TABLET | Freq: Three times a day (TID) | ORAL | Status: DC

## 2013-10-25 NOTE — Telephone Encounter (Signed)
Pt calling for refill on three medications: metoprolol tartrate (LOPRESSOR) 50 MG tablet, seizure meds, and headache medication. Pt is unsure of the names of the last two medications.  Please f/u with pt. If pt's phone not working or there is no answer pls contact pt's Emergency Contact: Ms Donell SievertRonda Walker [phone] with refill info.

## 2013-10-25 NOTE — Telephone Encounter (Signed)
Pt would like to speak to nurse. Please f/u with pt.

## 2013-10-28 ENCOUNTER — Telehealth: Payer: Self-pay | Admitting: Emergency Medicine

## 2013-10-28 NOTE — Telephone Encounter (Signed)
Pt informed medication Lopressor and Tegratol [facility] per request He will need to pick hard copy Tramadol at clinic

## 2013-10-28 NOTE — Telephone Encounter (Signed)
Pt calling to check on status of medication refill. Says he has been waiting for Dr's response regarding meds. Please f/u with pt.

## 2014-01-23 ENCOUNTER — Inpatient Hospital Stay (HOSPITAL_COMMUNITY)
Admission: EM | Admit: 2014-01-23 | Discharge: 2014-01-25 | DRG: 101 | Disposition: A | Discharge status: Home / Self Care | Payer: Medicaid Other | Attending: Oncology | Admitting: Oncology

## 2014-01-23 ENCOUNTER — Emergency Department (HOSPITAL_COMMUNITY): Payer: Medicaid Other

## 2014-01-23 ENCOUNTER — Inpatient Hospital Stay (HOSPITAL_COMMUNITY): Payer: Medicaid Other

## 2014-01-23 ENCOUNTER — Encounter (HOSPITAL_COMMUNITY): Payer: Self-pay | Admitting: Emergency Medicine

## 2014-01-23 DIAGNOSIS — F17210 Nicotine dependence, cigarettes, uncomplicated: Secondary | ICD-10-CM | POA: Diagnosis present

## 2014-01-23 DIAGNOSIS — R270 Ataxia, unspecified: Secondary | ICD-10-CM

## 2014-01-23 DIAGNOSIS — Z79899 Other long term (current) drug therapy: Secondary | ICD-10-CM

## 2014-01-23 DIAGNOSIS — I10 Essential (primary) hypertension: Secondary | ICD-10-CM | POA: Diagnosis present

## 2014-01-23 DIAGNOSIS — G839 Paralytic syndrome, unspecified: Secondary | ICD-10-CM | POA: Diagnosis present

## 2014-01-23 DIAGNOSIS — F1010 Alcohol abuse, uncomplicated: Secondary | ICD-10-CM

## 2014-01-23 DIAGNOSIS — R29898 Other symptoms and signs involving the musculoskeletal system: Secondary | ICD-10-CM | POA: Diagnosis present

## 2014-01-23 DIAGNOSIS — B1920 Unspecified viral hepatitis C without hepatic coma: Secondary | ICD-10-CM | POA: Diagnosis present

## 2014-01-23 DIAGNOSIS — R32 Unspecified urinary incontinence: Secondary | ICD-10-CM | POA: Diagnosis present

## 2014-01-23 DIAGNOSIS — R531 Weakness: Secondary | ICD-10-CM

## 2014-01-23 DIAGNOSIS — Z8679 Personal history of other diseases of the circulatory system: Secondary | ICD-10-CM

## 2014-01-23 DIAGNOSIS — B182 Chronic viral hepatitis C: Secondary | ICD-10-CM | POA: Diagnosis present

## 2014-01-23 DIAGNOSIS — T421X1A Poisoning by iminostilbenes, accidental (unintentional), initial encounter: Secondary | ICD-10-CM

## 2014-01-23 DIAGNOSIS — N390 Urinary tract infection, site not specified: Secondary | ICD-10-CM

## 2014-01-23 DIAGNOSIS — Z9889 Other specified postprocedural states: Secondary | ICD-10-CM

## 2014-01-23 DIAGNOSIS — R569 Unspecified convulsions: Secondary | ICD-10-CM

## 2014-01-23 DIAGNOSIS — G40909 Epilepsy, unspecified, not intractable, without status epilepticus: Principal | ICD-10-CM | POA: Diagnosis present

## 2014-01-23 LAB — CBC
HCT: 35.8 % — ABNORMAL LOW (ref 39.0–52.0)
HCT: 35.1 % — ABNORMAL LOW (ref 39.0–52.0)
Hemoglobin: 12.7 g/dL — ABNORMAL LOW (ref 13.0–17.0)
Hemoglobin: 12.3 g/dL — ABNORMAL LOW (ref 13.0–17.0)
MCH: 35 pg — AB (ref 26.0–34.0)
MCH: 34.9 pg — ABNORMAL HIGH (ref 26.0–34.0)
MCHC: 35.5 g/dL (ref 30.0–36.0)
MCHC: 35 g/dL (ref 30.0–36.0)
MCV: 98.6 fL (ref 78.0–100.0)
MCV: 99.7 fL (ref 78.0–100.0)
Platelets: 169 10*3/uL (ref 150–400)
Platelets: 162 10*3/uL (ref 150–400)
RBC: 3.63 MIL/uL — AB (ref 4.22–5.81)
RBC: 3.52 MIL/uL — ABNORMAL LOW (ref 4.22–5.81)
RDW: 12.8 % (ref 11.5–15.5)
RDW: 12.9 % (ref 11.5–15.5)
WBC: 11.5 10*3/uL — ABNORMAL HIGH (ref 4.0–10.5)
WBC: 10.1 10*3/uL (ref 4.0–10.5)

## 2014-01-23 LAB — MRSA PCR SCREENING: MRSA by PCR: NEGATIVE

## 2014-01-23 LAB — URINALYSIS, ROUTINE W REFLEX MICROSCOPIC
GLUCOSE, UA: NEGATIVE mg/dL
Ketones, ur: NEGATIVE mg/dL
Nitrite: POSITIVE — AB
PROTEIN: NEGATIVE mg/dL
Specific Gravity, Urine: 1.019 (ref 1.005–1.030)
Urobilinogen, UA: >8 mg/dL — ABNORMAL HIGH (ref 0.0–1.0)
pH: 6 (ref 5.0–8.0)

## 2014-01-23 LAB — MAGNESIUM: MAGNESIUM: 2 mg/dL (ref 1.5–2.5)

## 2014-01-23 LAB — BASIC METABOLIC PANEL
ANION GAP: 15 (ref 5–15)
BUN: 9 mg/dL (ref 6–23)
CO2: 24 meq/L (ref 19–32)
CREATININE: 0.6 mg/dL (ref 0.50–1.35)
Calcium: 8.7 mg/dL (ref 8.4–10.5)
Chloride: 95 mEq/L — ABNORMAL LOW (ref 96–112)
GFR calc Af Amer: >90 mL/min (ref >90)
Glucose, Bld: 105 mg/dL — ABNORMAL HIGH (ref 70–99)
Potassium: 4.8 mEq/L (ref 3.7–5.3)
Sodium: 134 mEq/L — ABNORMAL LOW (ref 137–147)

## 2014-01-23 LAB — URINE MICROSCOPIC-ADD ON

## 2014-01-23 LAB — CREATININE, SERUM
Creatinine, Ser: 0.65 mg/dL (ref 0.50–1.35)
GFR calc Af Amer: >90 mL/min (ref >90)
GFR calc non Af Amer: >90 mL/min (ref >90)

## 2014-01-23 LAB — RAPID URINE DRUG SCREEN, HOSP PERFORMED
AMPHETAMINES: NOT DETECTED
BARBITURATES: NOT DETECTED
BENZODIAZEPINES: NOT DETECTED
Cocaine: NOT DETECTED
Opiates: NOT DETECTED
Tetrahydrocannabinol: POSITIVE — AB

## 2014-01-23 LAB — PHOSPHORUS: Phosphorus: 4.5 mg/dL (ref 2.3–4.6)

## 2014-01-23 LAB — CARBAMAZEPINE LEVEL, TOTAL: Carbamazepine Lvl: 17.2 ug/mL (ref 4.0–12.0)

## 2014-01-23 LAB — ETHANOL: Alcohol, Ethyl (B): <11 mg/dL (ref 0–11)

## 2014-01-23 LAB — HIV ANTIBODY (ROUTINE TESTING W REFLEX): HIV 1&2 Ab, 4th Generation: NONREACTIVE

## 2014-01-23 MED ORDER — HEPARIN SODIUM (PORCINE) 5000 UNIT/ML IJ SOLN
5000.0000 [IU] | Freq: Three times a day (TID) | INTRAMUSCULAR | Status: DC
  Filled 2014-01-23 (×6): qty 1

## 2014-01-23 MED ORDER — VITAMIN B-1 100 MG PO TABS
100.0000 mg | ORAL_TABLET | Freq: Every day | ORAL | Status: DC
  Administered 2014-01-23 – 2014-01-25 (×3): 100 mg via ORAL
  Filled 2014-01-23 (×3): qty 1

## 2014-01-23 MED ORDER — HYDROCODONE-ACETAMINOPHEN 5-325 MG PO TABS
1.0000 | ORAL_TABLET | ORAL | Status: DC | PRN
  Administered 2014-01-24 – 2014-01-25 (×3): 2 via ORAL
  Filled 2014-01-23 (×3): qty 2

## 2014-01-23 MED ORDER — ONDANSETRON HCL 4 MG PO TABS: 4.0000 mg | ORAL_TABLET | Freq: Four times a day (QID) | ORAL | Status: DC | PRN

## 2014-01-23 MED ORDER — LORAZEPAM 2 MG/ML IJ SOLN
1.0000 mg | Freq: Once | INTRAMUSCULAR | Status: AC
  Administered 2014-01-23: 1 mg via INTRAVENOUS
  Filled 2014-01-23: qty 1

## 2014-01-23 MED ORDER — FOLIC ACID 1 MG PO TABS
1.0000 mg | ORAL_TABLET | Freq: Every day | ORAL | Status: DC
  Administered 2014-01-23 – 2014-01-25 (×3): 1 mg via ORAL
  Filled 2014-01-23 (×3): qty 1

## 2014-01-23 MED ORDER — OXYCODONE-ACETAMINOPHEN 5-325 MG PO TABS
1.0000 | ORAL_TABLET | Freq: Once | ORAL | Status: AC
  Administered 2014-01-23: 1 via ORAL
  Filled 2014-01-23: qty 1

## 2014-01-23 MED ORDER — SODIUM CHLORIDE 0.9 % IJ SOLN
3.0000 mL | Freq: Two times a day (BID) | INTRAMUSCULAR | Status: DC
  Administered 2014-01-24 – 2014-01-25 (×2): 3 mL via INTRAVENOUS

## 2014-01-23 MED ORDER — ONDANSETRON HCL 4 MG/2ML IJ SOLN: 4.0000 mg | Freq: Four times a day (QID) | INTRAMUSCULAR | Status: DC | PRN

## 2014-01-23 NOTE — Progress Notes (Signed)
Utilization review completed.  

## 2014-01-23 NOTE — Progress Notes (Signed)
NURSING PROGRESS NOTE  [name] [name] [id number] Admission Data: 01/23/2014 6:06 PM Attending Provider: Levert FeinsteinJames M Granfortuna, MD PCP:No primary care provider on file. Code Status: full   [name] AlexanderFennell Porath is a 53 y.o. male patient admitted from ED:  -No acute distress noted.  -No complaints of shortness of breath.  -No complaints of chest pain.   Cardiac Monitoring: Box # 9 in place. Cardiac monitor yields:normal sinus rhythm.  Blood pressure 130/85, pulse 73, temperature 97.5 F (36.4 C), temperature source Oral, resp. rate 20, height 6\' 6"  (1.981 m), weight 96.163 kg (212 lb), SpO2 98 %.   IV Fluids:  IV in place, occlusive dsg intact without redness, IV cath hand left, condition patent normal saline lock.   Allergies:  Review of patient's allergies indicates no known allergies.  Past Medical History:   has a past medical history of Seizure; ETOHism; Aneurysm; and Hypertension.  Past Surgical History:   has past surgical history that includes Cerebral aneurysm repair; I&D extremity (Left, 10/26/2012); I&D extremity (Left, 10/29/2012); Hardware Removal (Left, 10/29/2012); Incision and drainage abscess (Left, 11/02/2012); and I&D extremity (Left, 11/02/2012).  Social History:   reports that he has been smoking Cigarettes.  He has been smoking about 0.00 packs per day. He has never used smokeless tobacco. He reports that he does not drink alcohol or use illicit drugs.  Skin: warm dry intact   Patient/Family orientated to room. Information packet given to patient/family. Admission inpatient armband information verified with patient/family to include name and date of birth and placed on patient arm. Side rails up x 2, fall assessment and education completed with patient/family. Patient/family able to verbalize understanding of risk associated with falls and verbalized understanding to call for assistance before getting out of bed. Call light within reach. Patient/family able to voice and  demonstrate understanding of unit orientation instructions.    Will continue to evaluate and treat per MD orders.

## 2014-01-23 NOTE — ED Notes (Signed)
Pt called EMS for a seizure, which was unwitnessed. Pt with hx of seizures. Pt reports having a seizure yesterday, but did not go to the hospital. Pt reports a change in medications, but states he doesn't know which meds were changed. Pt was soiled upon EMS arrival. Pt denies using ETOH or drugs. Pt is belligerent and is swearing. CBG for EMS was 123.

## 2014-01-23 NOTE — Plan of Care (Signed)
Problem: Consults Goal: General Medical Patient Education See Patient Education Module for specific education. Outcome: Completed/Met Date Met:  01/23/14

## 2014-01-23 NOTE — Plan of Care (Signed)
Problem: Consults Goal: Diabetes Guidelines if Diabetic/Glucose > 140 If diabetic or lab glucose is > 140 mg/dl - Initiate Diabetes/Hyperglycemia Guidelines & Document Interventions  Outcome: Not Applicable Date Met:  01/23/14     

## 2014-01-23 NOTE — ED Notes (Signed)
This RN ordered a regular diet tray for the pt.

## 2014-01-23 NOTE — Progress Notes (Signed)
Pt pulled iv out by accident, refused iv restart, states "if you need to give me anything in it you can restart it".

## 2014-01-23 NOTE — H&P (Signed)
Date: 01/23/2014               Patient Name:  [name] [name] MRN: [id number]  DOB: 09-30-1960 Age / Sex: 53 y.o., male   PCP: No primary care provider on file.         Medical Service: Internal Medicine Teaching Service         Attending Physician: Dr. Levert Feinstein, MD    First Contact: Dr. Vivianne Master  Pager: [phone]  Second Contact: Dr. Cheryll Dessert Pager: [phone]       After Hours (After 5p/  First Contact Pager: [phone]  weekends / holidays): Second Contact Pager: [phone]   Chief Complaint: bilateral leg weakness and suspected seizure  History of Present Illness: [name] [name] is a 53 yo man pmh cerebral aneurysm s/p repair in 2007 in [place], [place], seizure disorder, HTN, and alcoholism presents from home with urinary incontinence and le weakness after a suspected seizure. The patient states that for the past 2 days he has had sudden acute onset leg weakness "like if I am in the cold numb feeling." he has not been able to stand or use the bathroom for the 2 days as well and has been ambulating on his knees to get around. He doesn't have any shooting pain or ulcerations on his leg and has not had any upper extremity weakness, facial droop, dysphagia, or dysarthria. The patient lives alone and woke up this AM trying to get to the bathroom given the urge to urinate but was unable to stand on his legs (as for the past 2 days) when he lost consciousness and found himself in a pool of urine. He states that he has had the urge to urinate for the past 2 days but "just can't do it." he does have some low back pain that has worsened over this time as well. He denies any trauma but is not sure with the loss of consciousness and the fact he lives alone. He states he smokes at least 1/4 ppd and his last drink was a beer about 3 days ago. He denied any fever/chills, IVDU, nausea/vomiting/diarrhea, abdominal pain, stool incontinence, HA, or rashes. 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 He states has not had an established neurologist in the area and recent medication changes for his AEDs was done this month because of "it being too strong."  Meds: No current facility-administered medications for this encounter.   Current Outpatient Prescriptions  Medication Sig Dispense Refill  . carbamazepine (TEGRETOL) 200 MG tablet Take 2 tablets (400 mg total) by mouth 3 (three) times daily. 180 tablet 3  . levETIRAcetam (KEPPRA) 1000 MG tablet Take 1,000 mg by mouth 2 (two) times daily.    . metoprolol (LOPRESSOR) 50 MG tablet Take 0.5 tablets (25 mg total) by mouth 2 (two) times daily. 60 tablet 3  . traMADol (ULTRAM) 50 MG tablet Take 1 tablet (50 mg total) by mouth every 6 (six) hours as needed. 90 tablet 0    Allergies: Allergies as of 01/23/2014  . (No Known Allergies)   Past Medical History  Diagnosis Date  . Seizure   . ETOHism   . Aneurysm   . Hypertension    Past Surgical History  Procedure Laterality Date  . Cerebral aneurysm repair      At [facility]  . I&d extremity Left 10/26/2012    Procedure: IRRIGATION AND DEBRIDEMENT Left Elbow;  Surgeon:  Sheral Apleyimothy D Murphy, MD;  [facility];  Service: Orthopedics;  Laterality: Left;  . I&d extremity Left 10/29/2012    Procedure: IRRIGATION AND DEBRIDEMENT EXTREMITY, wound vac change, stimulan beads;  Surgeon: Sheral Apleyimothy D Murphy, MD;  Location: [facility] OR;  Service: Orthopedics;  Laterality: Left;  lateral on bean bag  . Hardware removal Left 10/29/2012    Procedure: HARDWARE REMOVAL;  Surgeon: Sheral Apleyimothy D Murphy, MD;  [facility];  Service: Orthopedics;  Laterality: Left;  . Incision and drainage abscess Left 11/02/2012    Procedure: INCISION AND DRAINAGE ABSCESS;  Surgeon: Sheral Apleyimothy D Murphy, MD;  Location: [facility] OR;  Service: Orthopedics;  Laterality: Left;  . I&d extremity Left 11/02/2012    Procedure: IRRIGATION AND DEBRIDEMENT EXTREMITY;  Surgeon: Sheral Apleyimothy D Murphy, MD;  Location: [facility]  OR;  Service: Orthopedics;  Laterality: Left;   Family History  Problem Relation Age of Onset  . Hypertension Mother    History   Social History  . Marital Status: Single    Spouse Name: N/A    Number of Children: N/A  . Years of Education: N/A   Occupational History  . Not on file.   Social History Main Topics  . Smoking status: Current Some Day Smoker    Types: Cigarettes  . Smokeless tobacco: Never Used  . Alcohol Use: No     Comment: quit drinking 11/2011 per patient  . Drug Use: No  . Sexual Activity: Not Currently   Other Topics Concern  . Not on file   Social History Narrative    Review of Systems: Pertinent items are noted in HPI.  Physical Exam: Blood pressure 141/79, pulse 89, temperature 98.6 F (37 C), temperature source Oral, resp. rate 16, height 6\' 6"  (1.981 m), weight 212 lb (96.163 kg), SpO2 94 %. General: resting in bed, NAD, strong smell of urine and pt appears unkept HEENT: PERRL, EOMI, no scleral icterus Cardiac: RRR, no rubs, murmurs or gallops Pulm: clear to auscultation bilaterally, no crackles, wheezes or rhonchi, moving normal volumes of air Abd: soft, nontender, nondistended, BS present Ext: warm and well perfused, no pedal edema Neuro: alert and oriented X3, cranial nerves II-XII grossly intact, UE 5/5 strength bilaterally, slow finger to nose and discoordination with rapid alternating hand movements, bilateral LE decreased dorsiflexion, 3/5 LE strength bilaterally, sensory deficits in stocking pattern from feet to midcalf bilaterally   Lab results: Basic Metabolic Panel:  Recent Labs  16/12/9609/05/15 0405  NA 134*  K 4.8  CL 95*  CO2 24  GLUCOSE 105*  BUN 9  CREATININE 0.60  CALCIUM 8.7   CBC:  Recent Labs  01/23/14 0405  WBC 11.5*  HGB 12.7*  HCT 35.8*  MCV 98.6  PLT 169   Urine Drug Screen: Drugs of Abuse     Component Value Date/Time   LABOPIA NONE DETECTED 08/29/2013 1545   COCAINSCRNUR POSITIVE* 08/29/2013 1545    LABBENZ NONE DETECTED 08/29/2013 1545   AMPHETMU NONE DETECTED 08/29/2013 1545   THCU NONE DETECTED 08/29/2013 1545   LABBARB NONE DETECTED 08/29/2013 1545    Alcohol Level:  Recent Labs  01/23/14 0405  ETH <11   Imaging results:  Ct Head Wo Contrast  01/23/2014   CLINICAL DATA:  History of seizures. Seizure yesterday. History of brain aneurysm.  EXAM: CT HEAD WITHOUT CONTRAST  TECHNIQUE: Contiguous axial images were obtained from the base of the skull through the vertex without intravenous contrast.  COMPARISON:  08/29/2013  FINDINGS: Mild diffuse cerebral atrophy.  Ventricular dilatation consistent with central atrophy. Cavum septum pellucidum. Low-attenuation encephalomalacia in the frontal lobes bilaterally the consistent with old ischemic insult or posttraumatic change. No mass effect or midline shift. No abnormal extra-axial fluid collections. Gray-white matter junctions are distinct. Basal cisterns are not effaced. No evidence of acute intracranial hemorrhage. No depressed skull fractures. Old fracture deformity of the left medial orbital wall. It visualized paranasal sinuses and mastoid air cells are clear.  IMPRESSION: No acute intracranial abnormalities. Chronic atrophy. 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 No change since previous study.   Electronically Signed   By: Burman NievesWilliam  Stevens M.D.   On: 01/23/2014 06:38    Assessment & Plan by Problem: # Acute onset bilateral LE weakness, new sensory deficits, and inability to urinate: red flag symptoms concerning for cord compression given acuity and physical exam findings. Alcoholic myopathy maybe a contributing component, doesn't sound to be GBS as pt has not had acute illness previously and rapid onset make less likely, MS would be detected on MRI, pt doesn't have any symptoms to suggestand Todd's paralysis is also a possibility as discussed with neuro MD but concern that has persisted for at least 48 hrs.  -spoke to  neurology MD in ED who recommended stat MRI thoracic and lumbar spine -HgbA1c for neuropathy  #Seizure: pt with a known history in setting of coiled aneurysm. Pt doesn't have established neurology or PCP and was given tegretol previously. Level today supratheratputic at 17 concern that toxicity could be precipitating seizure or cerebellar disease (but this is complicated in setting of known liver dz but could explain some discoordination with alternating hand flapping). Ct head negative and pt doesn't have other signs to suggest acute stroke but maybe need to be considered if MRI as above is negative.  -uds, HIV, keppra level -seizure precautions -neurology consult for seizure management   #Hepatitis C: pt with known Hepc genotype 1b back in 2011 and recheck 8/14 with quant 1.5 million. Pt has not undergone any treatment previously.   Dispo: Disposition is deferred at this time, awaiting improvement of current medical problems. Anticipated discharge in approximately 2-3 day(s).   The patient does not have a current PCP (No primary care provider on file.) and [facility].  The patient does not have transportation limitations that hinder transportation to clinic appointments.  Signed: Christen BameNora Ariba Lehnen, MD 01/23/2014, 10:17 AM

## 2014-01-23 NOTE — ED Notes (Signed)
Campos, MD at bedside.  

## 2014-01-23 NOTE — ED Notes (Signed)
Pt now stating that he is unable to walk.

## 2014-01-23 NOTE — ED Notes (Signed)
Pt is reporting a headache. Patria Maneampos, MD is aware.

## 2014-01-23 NOTE — Plan of Care (Signed)
Problem: Phase I Progression Outcomes Goal: Voiding-avoid urinary catheter unless indicated Outcome: Completed/Met Date Met:  01/23/14     

## 2014-01-23 NOTE — Progress Notes (Signed)
Admission RN came to document on pt, pt refuse to answer questions.

## 2014-01-23 NOTE — Progress Notes (Signed)
RN called for report.  

## 2014-01-23 NOTE — ED Notes (Signed)
Meal tray given 

## 2014-01-23 NOTE — ED Provider Notes (Signed)
CSN: [id number]     Arrival date & time 01/23/14  0225 History   First MD Initiated Contact with Patient 01/23/14 0304     Chief Complaint  Patient presents with  . Seizures      HPI Patient has a history of seizure disorder.  He is on Tegretol for this.  He was brought to the emergency department for a unwitnessed seizure today.  He reports he had a seizure yesterday but didn't go the hospital.  Patient does report difficulty ambulating over the past 24 hours and states he is having issues with his balance.  Denies headache.  No head injury or head trauma.  Denies focal weakness of his arms or legs.  Compliant with his medications.  No fevers or chills.  No headache at this time.  No chest or abdominal pain.   Past Medical History  Diagnosis Date  . Seizure   . ETOHism   . Aneurysm   . Hypertension    Past Surgical History  Procedure Laterality Date  . Cerebral aneurysm repair      [facility]  . I&d extremity Left 10/26/2012    Procedure: IRRIGATION AND DEBRIDEMENT Left Elbow;  Surgeon: Sheral Apleyimothy D Murphy, MD;  [facility];  Service: Orthopedics;  Laterality: Left;  . I&d extremity Left 10/29/2012    Procedure: IRRIGATION AND DEBRIDEMENT EXTREMITY, wound vac change, stimulan beads;  Surgeon: Sheral Apleyimothy D Murphy, MD;  Location: [facility] OR;  Service: Orthopedics;  Laterality: Left;  lateral on bean bag  . Hardware removal Left 10/29/2012    Procedure: HARDWARE REMOVAL;  Surgeon: Sheral Apleyimothy D Murphy, MD;  [facility];  Service: Orthopedics;  Laterality: Left;  . Incision and drainage abscess Left 11/02/2012    Procedure: INCISION AND DRAINAGE ABSCESS;  Surgeon: Sheral Apleyimothy D Murphy, MD;  Location: [facility] OR;  Service: Orthopedics;  Laterality: Left;  . I&d extremity Left 11/02/2012    Procedure: IRRIGATION AND DEBRIDEMENT EXTREMITY;  Surgeon: Sheral Apleyimothy D Murphy, MD;  Location: [facility] OR;  Service: Orthopedics;  Laterality: Left;   Family History  Problem Relation Age of Onset  . Hypertension Mother     History  Substance Use Topics  . Smoking status: Current Some Day Smoker    Types: Cigarettes  . Smokeless tobacco: Never Used  . Alcohol Use: No     Comment: quit drinking 11/2011 per patient    Review of Systems  All other systems reviewed and are negative.     Allergies  Review of patient's allergies indicates no known allergies.  Home Medications   Prior to Admission medications   Medication Sig Start Date End Date Taking? Authorizing Provider  carbamazepine (TEGRETOL) 200 MG tablet Take 2 tablets (400 mg total) by mouth 3 (three) times daily. 10/25/13  Yes Quentin Angstlugbemiga E Jegede, MD  metoprolol (LOPRESSOR) 50 MG tablet Take 0.5 tablets (25 mg total) by mouth 2 (two) times daily. 10/25/13  Yes Quentin Angstlugbemiga E Jegede, MD  traMADol (ULTRAM) 50 MG tablet Take 1 tablet (50 mg total) by mouth every 6 (six) hours as needed. 09/04/13   Dorothea OgleIskra M Myers, MD   BP 136/75 mmHg  Pulse 78  Temp(Src) 98.6 F (37 C) (Oral)  Resp 27  Ht 6\' 6"  (1.981 m)  Wt 212 lb (96.163 kg)  BMI 24.50 kg/m2  SpO2 95% Physical Exam  Constitutional: He is oriented to person, place, and time. He appears well-developed and well-nourished.  HENT:  Head: Normocephalic and atraumatic.  Eyes: EOM are normal. Pupils  are equal, round, and reactive to light.  Neck: Normal range of motion.  Cardiovascular: Normal rate, regular rhythm, normal heart sounds and intact distal pulses.   Pulmonary/Chest: Effort normal and breath sounds normal. No respiratory distress.  Abdominal: Soft. He exhibits no distension. There is no tenderness.  Musculoskeletal: Normal range of motion.  Neurological: He is alert and oriented to person, place, and time.  5/5 strength in major muscle groups of  bilateral upper and lower extremities. Speech normal. No facial asymetry. Gait not tested  Skin: Skin is warm and dry.  Psychiatric: He has a normal mood and affect. Judgment normal.  Nursing note and vitals reviewed.   ED Course   Procedures (including critical care time) Labs Review Labs Reviewed  CBC - Abnormal; Notable for the following:    WBC 11.5 (*)    RBC 3.63 (*)    Hemoglobin 12.7 (*)    HCT 35.8 (*)    MCH 35.0 (*)    All other components within normal limits  BASIC METABOLIC PANEL - Abnormal; Notable for the following:    Sodium 134 (*)    Chloride 95 (*)    Glucose, Bld 105 (*)    All other components within normal limits  CARBAMAZEPINE LEVEL, TOTAL - Abnormal; Notable for the following:    Carbamazepine Lvl 17.2 (*)    All other components within normal limits  ETHANOL    Imaging Review Ct Head Wo Contrast  01/23/2014   CLINICAL DATA:  History of seizures. Seizure yesterday. History of brain aneurysm.  EXAM: CT HEAD WITHOUT CONTRAST  TECHNIQUE: Contiguous axial images were obtained from the base of the skull through the vertex without intravenous contrast.  COMPARISON:  08/29/2013  FINDINGS: Mild diffuse cerebral atrophy. Ventricular dilatation consistent with central atrophy. Cavum septum pellucidum. Low-attenuation encephalomalacia in the frontal lobes bilaterally the consistent with old ischemic insult or posttraumatic change. No mass effect or midline shift. No abnormal extra-axial fluid collections. Gray-white matter junctions are distinct. Basal cisterns are not effaced. No evidence of acute intracranial hemorrhage. No depressed skull fractures. Old fracture deformity of the left medial orbital wall. It visualized paranasal sinuses and mastoid air cells are clear.  IMPRESSION: No acute intracranial abnormalities. Chronic atrophy. 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 No change since previous study.   Electronically Signed   By: Burman NievesWilliam  Stevens M.D.   On: 01/23/2014 06:38  I personally reviewed the imaging tests through PACS system I reviewed available ER/hospitalization records through the EMR    EKG Interpretation   Date/Time:  Thursday January 23 2014 02:33:51  EST Ventricular Rate:  92 PR Interval:  158 QRS Duration: 90 QT Interval:  355 QTC Calculation: 439 R Axis:   32 Text Interpretation:  Sinus rhythm No significant change was found  Confirmed by Marva Hendryx  MD, Seana Underwood ([id number]) on 01/23/2014 5:52:38 AM      MDM   Final diagnoses:  Ataxia  Tegretol toxicity, accidental or unintentional, initial encounter  Seizure    Suspect his symptoms are secondary to Tegretol toxicity.  His level is elevated at 17.  I think the patient will benefit from hospitalization and suspect his symptoms will resolve with normalization of his Tegretol level    Lyanne CoKevin M Shanna Strength, MD 01/23/14 [phone]

## 2014-01-24 DIAGNOSIS — K769 Liver disease, unspecified: Secondary | ICD-10-CM

## 2014-01-24 DIAGNOSIS — I10 Essential (primary) hypertension: Secondary | ICD-10-CM

## 2014-01-24 DIAGNOSIS — R29898 Other symptoms and signs involving the musculoskeletal system: Secondary | ICD-10-CM

## 2014-01-24 DIAGNOSIS — R569 Unspecified convulsions: Secondary | ICD-10-CM

## 2014-01-24 LAB — HEMOGLOBIN A1C
Hgb A1c MFr Bld: 5.7 % — ABNORMAL HIGH (ref <5.7)
Mean Plasma Glucose: 117 mg/dL — ABNORMAL HIGH (ref <117)

## 2014-01-24 LAB — VITAMIN B12: Vitamin B-12: 406 pg/mL (ref 211–911)

## 2014-01-24 LAB — CBC WITH DIFFERENTIAL/PLATELET
BASOS PCT: 0 % (ref 0–1)
Basophils Absolute: 0 10*3/uL (ref 0.0–0.1)
EOS PCT: 1 % (ref 0–5)
Eosinophils Absolute: 0.1 10*3/uL (ref 0.0–0.7)
HEMATOCRIT: 34.6 % — AB (ref 39.0–52.0)
Hemoglobin: 12.2 g/dL — ABNORMAL LOW (ref 13.0–17.0)
Lymphocytes Relative: 34 % (ref 12–46)
Lymphs Abs: 3.3 10*3/uL (ref 0.7–4.0)
MCH: 34.4 pg — ABNORMAL HIGH (ref 26.0–34.0)
MCHC: 35.3 g/dL (ref 30.0–36.0)
MCV: 97.5 fL (ref 78.0–100.0)
MONO ABS: 0.9 10*3/uL (ref 0.1–1.0)
Monocytes Relative: 9 % (ref 3–12)
NEUTROS ABS: 5.6 10*3/uL (ref 1.7–7.7)
Neutrophils Relative %: 56 % (ref 43–77)
Platelets: 142 10*3/uL — ABNORMAL LOW (ref 150–400)
RBC: 3.55 MIL/uL — ABNORMAL LOW (ref 4.22–5.81)
RDW: 12.4 % (ref 11.5–15.5)
WBC: 9.9 10*3/uL (ref 4.0–10.5)

## 2014-01-24 LAB — COMPREHENSIVE METABOLIC PANEL
ALK PHOS: 89 U/L (ref 39–117)
ALT: 15 U/L (ref 0–53)
ANION GAP: 14 (ref 5–15)
AST: 19 U/L (ref 0–37)
Albumin: 3 g/dL — ABNORMAL LOW (ref 3.5–5.2)
BILIRUBIN TOTAL: 0.3 mg/dL (ref 0.3–1.2)
BUN: 6 mg/dL (ref 6–23)
CHLORIDE: 94 meq/L — AB (ref 96–112)
CO2: 25 mEq/L (ref 19–32)
Calcium: 8.7 mg/dL (ref 8.4–10.5)
Creatinine, Ser: 0.63 mg/dL (ref 0.50–1.35)
GFR calc non Af Amer: >90 mL/min (ref >90)
GLUCOSE: 109 mg/dL — AB (ref 70–99)
POTASSIUM: 3.9 meq/L (ref 3.7–5.3)
Sodium: 133 mEq/L — ABNORMAL LOW (ref 137–147)
Total Protein: 7.5 g/dL (ref 6.0–8.3)

## 2014-01-24 LAB — FOLATE: Folate: >20 ng/mL

## 2014-01-24 LAB — TSH: TSH: 3.55 u[IU]/mL (ref 0.350–4.500)

## 2014-01-24 LAB — CARBAMAZEPINE LEVEL, TOTAL: Carbamazepine Lvl: 4.9 ug/mL (ref 4.0–12.0)

## 2014-01-24 LAB — RPR

## 2014-01-24 MED ORDER — CARBAMAZEPINE 200 MG PO TABS
400.0000 mg | ORAL_TABLET | Freq: Three times a day (TID) | ORAL | Status: DC
  Administered 2014-01-24 – 2014-01-25 (×5): 400 mg via ORAL
  Filled 2014-01-24 (×6): qty 2

## 2014-01-24 MED ORDER — ADULT MULTIVITAMIN W/MINERALS CH
1.0000 | ORAL_TABLET | Freq: Every day | ORAL | Status: DC
  Administered 2014-01-24 – 2014-01-25 (×2): 1 via ORAL
  Filled 2014-01-24 (×2): qty 1

## 2014-01-24 MED ORDER — CIPROFLOXACIN HCL 500 MG PO TABS
500.0000 mg | ORAL_TABLET | Freq: Two times a day (BID) | ORAL | Status: DC
  Administered 2014-01-24 – 2014-01-25 (×4): 500 mg via ORAL
  Filled 2014-01-24 (×5): qty 1

## 2014-01-24 MED ORDER — NICOTINE 14 MG/24HR TD PT24
14.0000 mg | MEDICATED_PATCH | Freq: Every day | TRANSDERMAL | Status: DC
  Administered 2014-01-24 – 2014-01-25 (×2): 14 mg via TRANSDERMAL
  Filled 2014-01-24 (×2): qty 1

## 2014-01-24 MED ORDER — LEVETIRACETAM 500 MG PO TABS
1000.0000 mg | ORAL_TABLET | Freq: Two times a day (BID) | ORAL | Status: DC
  Administered 2014-01-24 – 2014-01-25 (×4): 1000 mg via ORAL
  Filled 2014-01-24 (×4): qty 2

## 2014-01-24 NOTE — Progress Notes (Signed)
Subjective: Pt has no complaints. Denies difficulty urinating, has not had a BM   Objective: Vital signs in last 24 hours: Filed Vitals:   01/23/14 1200 01/23/14 1237 01/23/14 2330 01/24/14 0708  BP: 144/77 130/85 135/75 135/82  Pulse: 77 73 70 66  Temp:  97.5 F (36.4 C)  99.1 F (37.3 C)  TempSrc:  Oral  Oral  Resp:  20 17 16   Height:      Weight:      SpO2: 99% 98% 98% 100%   Weight change:   Intake/Output Summary (Last 24 hours) at 01/24/14 0839 Last data filed at 01/23/14 2100  Gross per 24 hour  Intake      0 ml  Output    450 ml  Net   -450 ml   PE General: NAD HEENT: moist mucous membranes Lungs: CTAB Cardiac: RRR, no murmurs GI: active bowel sounds Neuro: CN 2-12 grossly intact, able to lift LEs off bed and bend knees. Patellar reflexes intact b/l  Lab Results: Basic Metabolic Panel:  Recent Labs Lab 01/23/14 0405 01/23/14 1328  NA 134*  --   K 4.8  --   CL 95*  --   CO2 24  --   GLUCOSE 105*  --   BUN 9  --   CREATININE 0.60 0.65  CALCIUM 8.7  --   MG  --  2.0  PHOS  --  4.5   CBC:  Recent Labs Lab 01/23/14 1328 01/24/14 0500  WBC 10.1 9.9  NEUTROABS  --  5.6  HGB 12.3* 12.2*  HCT 35.1* 34.6*  MCV 99.7 97.5  PLT 162 142*   Thyroid Function Tests:  Recent Labs Lab 01/24/14 0500  TSH 3.550   Urine Drug Screen: Drugs of Abuse     Component Value Date/Time   LABOPIA NONE DETECTED 01/23/2014 1215   COCAINSCRNUR NONE DETECTED 01/23/2014 1215   LABBENZ NONE DETECTED 01/23/2014 1215   AMPHETMU NONE DETECTED 01/23/2014 1215   THCU POSITIVE* 01/23/2014 1215   LABBARB NONE DETECTED 01/23/2014 1215    Alcohol Level:  Recent Labs Lab 01/23/14 0405  ETH <11   Urinalysis:  Recent Labs Lab 01/23/14 1209  COLORURINE AMBER*  LABSPEC 1.019  PHURINE 6.0  GLUCOSEU NEGATIVE  HGBUR TRACE*  BILIRUBINUR SMALL*  KETONESUR NEGATIVE  PROTEINUR NEGATIVE  UROBILINOGEN >8.0*  NITRITE POSITIVE*  LEUKOCYTESUR LARGE*    Micro  Results: Recent Results (from the past 240 hour(s))  MRSA PCR Screening     Status: None   Collection Time: 01/23/14 12:55 PM  Result Value Ref Range Status   MRSA by PCR NEGATIVE NEGATIVE Final    Comment:        The GeneXpert MRSA Assay (FDA approved for NASAL specimens only), is one component of a comprehensive MRSA colonization surveillance program. It is not intended to diagnose MRSA infection nor to guide or monitor treatment for MRSA infections.    Studies/Results: Ct Head Wo Contrast  01/23/2014   CLINICAL DATA:  History of seizures. Seizure yesterday. History of brain aneurysm.  EXAM: CT HEAD WITHOUT CONTRAST  TECHNIQUE: Contiguous axial images were obtained from the base of the skull through the vertex without intravenous contrast.  COMPARISON:  08/29/2013  FINDINGS: Mild diffuse cerebral atrophy. Ventricular dilatation consistent with central atrophy. Cavum septum pellucidum. Low-attenuation encephalomalacia in the frontal lobes bilaterally the consistent with old ischemic insult or posttraumatic change. No mass effect or midline shift. No abnormal extra-axial fluid collections. Gray-white matter junctions are distinct.  Basal cisterns are not effaced. No evidence of acute intracranial hemorrhage. No depressed skull fractures. Old fracture deformity of the left medial orbital wall. It visualized paranasal sinuses and mastoid air cells are clear.  IMPRESSION: No acute intracranial abnormalities. Chronic atrophy. 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 No change since previous study.   Electronically Signed   By: Burman Nieves M.D.   On: 01/23/2014 06:38   Mr Thoracic Spine Wo Contrast  01/23/2014   EXAM: MRI THORACIC AND LUMBAR SPINE WITHOUT CONTRAST  TECHNIQUE: Multiplanar and multiecho pulse sequences of the thoracic and lumbar spine were obtained without intravenous contrast.  COMPARISON:  None.  FINDINGS: MR THORACIC SPINE FINDINGS  The vertebral bodies are  normally aligned with preservation of the normal thoracic kyphosis. No listhesis.  Signal intensity within the vertebral body bone marrow is normal. No focal osseous lesion.  Small chronic degenerative endplate Schmorl's nodes seen at the superior endplates of T5 through T9 and without significant height loss. Schmorl's node present at the inferior endplate of T12.  Signal intensity within the thoracic spinal cord is normal. No epidural fluid collection.  Paraspinous soft tissues within normal limits.  Mild degenerative disc bulging noted at C6-7 and C7-T1, incompletely evaluated on this exam. No significant canal stenosis.  Diffuse disc bulge with disc desiccation present at the T2-3 level. There is superimposed right foraminal disc protrusion resulting an moderate right foraminal narrowing. The central canal remains widely patent. No significant left foraminal narrowing.  At T3-4, there is a small left foraminal disc protrusion without significant stenosis.  No other significant degenerative disc disease identified within the thoracic spine.  MR LUMBAR SPINE FINDINGS  The vertebral bodies are normally aligned with preservation of the normal lumbar lordosis. Vertebral body heights are preserved. No acute fracture or or listhesis.  Bone marrow signal intensity mildly heterogeneous without focal osseous lesion. Heterogeneous signal intensity seen about the L4-5 and L5-S1 intervertebral disc spaces, compatible with advanced degenerative endplate changes.  Conus medullaris terminates normally at the L1-2 level. Signal intensity within the visualized cord is normal. Nerve roots of the cauda equina within normal limits. No epidural collection.  Paraspinal soft tissues within normal limits.  L1-2: Mild disc bulge with disc desiccation. No focal disc protrusion or facet arthropathy. No canal or foraminal stenosis.  L2-3: Mild disc desiccation without focal disc protrusion or significant disc bulge. No significant facet  arthropathy. No canal or neural foraminal stenosis.  L3-4: Diffuse disc bulge with disc desiccation. There is a a superimposed shallow right foraminal disc protrusion and/or osteophyte, resulting and mild right foraminal narrowing and closely approximating the exiting right L3 nerve root. Prominent anterior osteophytic spurring present as well. Mild bilateral facet hypertrophy. Central canal is widely patent without significant lateral recess stenosis.  L4-5: Diffuse disc bulge with disc desiccation and intervertebral disc space narrowing. Advanced degenerative endplate changes present. No definite focal disc protrusion. There is moderate bilateral facet arthrosis with mild ligamentum flavum hypertrophy. There is mild narrowing of the lateral recesses bilaterally. The bulging disc closely approximates the exiting L4 nerve roots bilaterally as they course out of the neural foramina. There is moderate left with mild right foraminal narrowing.  L5-S1: Degenerative intervertebral disc space narrowing with disc desiccation and disc bulge. Advanced degenerative endplate changes present. Moderate bilateral facet hypertrophy. There is a superimposed shallow right foraminal disc protrusion, closely approximating the exiting right L5 nerve root. The central canal remains widely patent. There is mild to moderate bilateral foraminal narrowing.  IMPRESSION: MR THORACIC SPINE IMPRESSION  1. No evidence for cord compression, significant canal stenosis, or other acute abnormality within the thoracic spine. 2. Disc bulge with superimposed right foraminal disc protrusion at T2-3 with resultant moderate right foraminal stenosis. 3. Small left foraminal disc protrusion at T3-4 without significant stenosis.  MR LUMBAR SPINE IMPRESSION  1. No evidence for cord compression, significant canal stenosis, or other acute abnormality within the lumbar spine. 2. Advanced degenerative disc disease with disc bulging at L4-5 and L5-S1 without  significant canal stenosis. 3. Superimposed small right foraminal disc protrusions at L3-4 and L5-S1, closely approximating the exiting right L3 and L5 nerve roots respectively. These could potentially result in nerve root irritation. 4. Additional multilevel degenerative changes as above.   Electronically Signed   By: Rise MuBenjamin  McClintock M.D.   On: 01/23/2014 21:51   Mr Lumbar Spine Wo Contrast  01/23/2014   EXAM: MRI THORACIC AND LUMBAR SPINE WITHOUT CONTRAST  TECHNIQUE: Multiplanar and multiecho pulse sequences of the thoracic and lumbar spine were obtained without intravenous contrast.  COMPARISON:  None.  FINDINGS: MR THORACIC SPINE FINDINGS  The vertebral bodies are normally aligned with preservation of the normal thoracic kyphosis. No listhesis.  Signal intensity within the vertebral body bone marrow is normal. No focal osseous lesion.  Small chronic degenerative endplate Schmorl's nodes seen at the superior endplates of T5 through T9 and without significant height loss. Schmorl's node present at the inferior endplate of T12.  Signal intensity within the thoracic spinal cord is normal. No epidural fluid collection.  Paraspinous soft tissues within normal limits.  Mild degenerative disc bulging noted at C6-7 and C7-T1, incompletely evaluated on this exam. No significant canal stenosis.  Diffuse disc bulge with disc desiccation present at the T2-3 level. There is superimposed right foraminal disc protrusion resulting an moderate right foraminal narrowing. The central canal remains widely patent. No significant left foraminal narrowing.  At T3-4, there is a small left foraminal disc protrusion without significant stenosis.  No other significant degenerative disc disease identified within the thoracic spine.  MR LUMBAR SPINE FINDINGS  The vertebral bodies are normally aligned with preservation of the normal lumbar lordosis. Vertebral body heights are preserved. No acute fracture or or listhesis.  Bone marrow  signal intensity mildly heterogeneous without focal osseous lesion. Heterogeneous signal intensity seen about the L4-5 and L5-S1 intervertebral disc spaces, compatible with advanced degenerative endplate changes.  Conus medullaris terminates normally at the L1-2 level. Signal intensity within the visualized cord is normal. Nerve roots of the cauda equina within normal limits. No epidural collection.  Paraspinal soft tissues within normal limits.  L1-2: Mild disc bulge with disc desiccation. No focal disc protrusion or facet arthropathy. No canal or foraminal stenosis.  L2-3: Mild disc desiccation without focal disc protrusion or significant disc bulge. No significant facet arthropathy. No canal or neural foraminal stenosis.  L3-4: Diffuse disc bulge with disc desiccation. There is a a superimposed shallow right foraminal disc protrusion and/or osteophyte, resulting and mild right foraminal narrowing and closely approximating the exiting right L3 nerve root. Prominent anterior osteophytic spurring present as well. Mild bilateral facet hypertrophy. Central canal is widely patent without significant lateral recess stenosis.  L4-5: Diffuse disc bulge with disc desiccation and intervertebral disc space narrowing. Advanced degenerative endplate changes present. No definite focal disc protrusion. There is moderate bilateral facet arthrosis with mild ligamentum flavum hypertrophy. There is mild narrowing of the lateral recesses bilaterally. The bulging disc closely approximates the exiting L4  nerve roots bilaterally as they course out of the neural foramina. There is moderate left with mild right foraminal narrowing.  L5-S1: Degenerative intervertebral disc space narrowing with disc desiccation and disc bulge. Advanced degenerative endplate changes present. Moderate bilateral facet hypertrophy. There is a superimposed shallow right foraminal disc protrusion, closely approximating the exiting right L5 nerve root. The central  canal remains widely patent. There is mild to moderate bilateral foraminal narrowing.  IMPRESSION: MR THORACIC SPINE IMPRESSION  1. No evidence for cord compression, significant canal stenosis, or other acute abnormality within the thoracic spine. 2. Disc bulge with superimposed right foraminal disc protrusion at T2-3 with resultant moderate right foraminal stenosis. 3. Small left foraminal disc protrusion at T3-4 without significant stenosis.  MR LUMBAR SPINE IMPRESSION  1. No evidence for cord compression, significant canal stenosis, or other acute abnormality within the lumbar spine. 2. Advanced degenerative disc disease with disc bulging at L4-5 and L5-S1 without significant canal stenosis. 3. Superimposed small right foraminal disc protrusions at L3-4 and L5-S1, closely approximating the exiting right L3 and L5 nerve roots respectively. These could potentially result in nerve root irritation. 4. Additional multilevel degenerative changes as above.   Electronically Signed   By: Rise MuBenjamin  McClintock M.D.   On: 01/23/2014 21:51   Medications: I have reviewed the patient's current medications. Scheduled Meds: . folic acid  1 mg Oral Daily  . heparin  5,000 Units Subcutaneous 3 times per day  . sodium chloride  3 mL Intravenous Q12H  . 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 MR negative for cord compression. HIV negative.  - resolved - neurology consulted this morning regarding AED medications  keppra and tegretol  - B12, folate, 24hr heavy metal to evaluate neuropathy pending  UTI- UA positive for nitrites and LE, TNTC WBCs - urine culture pending - cipro 500mg  BID  Dispo: Disposition is deferred at this time, awaiting improvement of current medical problems. Anticipated discharge in approximately 2-3 day(s).   The patient does not have a current PCP (No primary care provider on file.) and [facility] after [facility].  The patient does not have transportation limitations that hinder transportation to clinic appointments.  .Services Needed at time of discharge: Y = Yes, Blank = No PT:   OT:   RN:   Equipment:   Other:     LOS: 1 day   Gara Kroneriana Truong, MD 01/24/2014, 8:39 AM

## 2014-01-24 NOTE — Progress Notes (Signed)
MEDICATION RELATED CONSULT NOTE - INITIAL   Pharmacy Consult for Carbamazepine Indication:  Seizure disorder  No Known Allergies  Patient Measurements: Height: 6\' 6"  (198.1 cm) Weight: 212 lb (96.163 kg) IBW/kg (Calculated) : 91.4  Vital Signs: Temp: 99.1 F (37.3 C) (11/06 1353) Temp Source: Oral (11/06 1353) BP: 143/70 mmHg (11/06 1353) Pulse Rate: 65 (11/06 1353)  Labs:  Recent Labs  01/23/14 0405 01/23/14 1328 01/24/14 0500 01/24/14 1325  WBC 11.5* 10.1 9.9  --   HGB 12.7* 12.3* 12.2*  --   HCT 35.8* 35.1* 34.6*  --   PLT 169 162 142*  --   CREATININE 0.60 0.65  --  0.63  MG  --  2.0  --   --   PHOS  --  4.5  --   --   ALBUMIN  --   --   --  3.0*  PROT  --   --   --  7.5  AST  --   --   --  19  ALT  --   --   --  15  ALKPHOS  --   --   --  89  BILITOT  --   --   --  0.3   Estimated Creatinine Clearance: 134.9 mL/min (by C-G formula based on Cr of 0.63).  Medical History: Past Medical History  Diagnosis Date  . Seizure   . ETOHism   . Aneurysm   . Hypertension    Assessment:   Admitted on 11/5 with suspected seizure; hx coiled aneurysm.  Carbamazepine level 17.2 mcg/ml, supratherapeutic.  Tegretol and Keppra were held.  Keppra level sent out and pending.  No further seizures noted.  Keppra and Tegretol to resume today.  Added carbamazepine level to blood already in lab = 4.9 mcg/ml, low therapeutic.   Two prior levels in Epic from June 2015 were subtherapeutic, but he had run out of some of his meds at that time.   Level during August 2014 admission was therapeutic at 11.1 mcg/ml.  Goal of Therapy:   Carbamazepine levels 4-12 mcg/ml  Plan:   Resume Tegretol 400 mg TID.  Recheck level next week.  [name] [name], [name] [name], [place] Pager: [phone] 01/24/2014,4:32 PM

## 2014-01-24 NOTE — Consult Note (Signed)
NEURO HOSPITALIST CONSULT NOTE    Reason for Consult: LE weakness  HPI:                                                                                                                                          [name] [name] is an 53 y.o. male with history of ETOH abuse, intracranial aneurysm with seizures S/P surgery who presents to the hospital after having seizure and urinary incontinence.  No one was around to witness seizure however patient states he had a seizure at home. HE also describes a acute onset of LE weakness and feeling as though his feet are numb. HE feels he has no weakness but woke up two days ago and could not feel his feet.  "they felt cold".  He states he would get to the bathroom by crawling on his knees but when he arrived at the urinal, although he had to urinate he states he could not. HE denies any recent falls or trauma to his neck, head or back.  He states he has been on his AED doses for " a long time with no recent adjustments." When asked who prescribes his AED he states [facility] but now it is [place]. HE is a very poor historian and has a hard time staying on tract with his thoughts. During consultation he is more focused on getting his pain medication than giving a detailed history. Looking at residents notes, He stated "recent medication changes for his AEDs was done this month because of "it being too strong."  Past Medical History  Diagnosis Date  . Seizure   . ETOHism   . Aneurysm   . Hypertension     Past Surgical History  Procedure Laterality Date  . Cerebral aneurysm repair      At [facility]  . I&d extremity Left 10/26/2012    Procedure: IRRIGATION AND DEBRIDEMENT Left Elbow;  Surgeon: Sheral Apley, MD;  Location: [facility] OR;  Service: Orthopedics;  Laterality: Left;  . I&d extremity Left 10/29/2012    Procedure: IRRIGATION AND DEBRIDEMENT EXTREMITY, wound vac change, stimulan beads;  Surgeon: Sheral Apley, MD;  Location: [facility] OR;  Service: Orthopedics;  Laterality: Left;  lateral on bean bag  . Hardware removal Left 10/29/2012    Procedure: HARDWARE REMOVAL;  Surgeon: Sheral Apley, MD;  Location: [facility] OR;  Service: Orthopedics;  Laterality: Left;  . Incision and drainage abscess Left 11/02/2012    Procedure: INCISION AND DRAINAGE ABSCESS;  Surgeon: Sheral Apley, MD;  Location: [facility] OR;  Service: Orthopedics;  Laterality: Left;  . I&d extremity Left 11/02/2012    Procedure: IRRIGATION AND DEBRIDEMENT EXTREMITY;  Surgeon: Sheral Apley, MD;  Location: [facility] OR;  Service: Orthopedics;  Laterality: Left;    Family History  Problem Relation Age of Onset  . Hypertension Mother     Social History:  reports that he has been smoking Cigarettes.  He has been smoking about 0.00 packs per day. He has never used smokeless tobacco. He reports that he does not drink alcohol or use illicit drugs.  No Known Allergies  MEDICATIONS:                                                                                                                     Prior to Admission:  Prescriptions prior to admission  Medication Sig Dispense Refill Last Dose  . carbamazepine (TEGRETOL) 200 MG tablet Take 2 tablets (400 mg total) by mouth 3 (three) times daily. 180 tablet 3 01/22/2014 at Unknown time  . levETIRAcetam (KEPPRA) 1000 MG tablet Take 1,000 mg by mouth 2 (two) times daily.   01/22/2014 at Unknown time  . metoprolol (LOPRESSOR) 50 MG tablet Take 0.5 tablets (25 mg total) by mouth 2 (two) times daily. 60 tablet 3 01/22/2014 at 2100  . traMADol (ULTRAM) 50 MG tablet Take 1 tablet (50 mg total) by mouth every 6 (six) hours as needed. 90 tablet 0    Scheduled: . folic acid  1 mg Oral Daily  . heparin  5,000 Units Subcutaneous 3 times per day  . sodium chloride  3 mL Intravenous Q12H  . thiamine  100 mg Oral Daily     ROS:                                                                                                                                        History obtained from the patient  General ROS: negative for - chills, fatigue, fever, night sweats, weight gain or weight loss Psychological ROS: negative for - behavioral disorder, hallucinations, memory difficulties, mood swings or suicidal ideation Ophthalmic ROS: negative for - blurry vision, double vision, eye pain or loss of vision ENT ROS: negative for - epistaxis, nasal discharge, oral lesions, sore throat, tinnitus or vertigo Allergy and Immunology ROS: negative for - hives or itchy/watery eyes Hematological and Lymphatic ROS: negative for - bleeding problems, bruising or swollen lymph nodes Endocrine ROS: negative for - galactorrhea, hair pattern changes, polydipsia/polyuria or temperature intolerance Respiratory ROS: negative for - cough, hemoptysis, shortness of breath or wheezing Cardiovascular ROS: negative for - chest pain, dyspnea on exertion, edema  or irregular heartbeat Gastrointestinal ROS: negative for - abdominal pain, diarrhea, hematemesis, nausea/vomiting or stool incontinence Genito-Urinary ROS: negative for - dysuria, hematuria, incontinence or urinary frequency/urgency Musculoskeletal ROS: negative for - joint swelling or muscular weakness Neurological ROS: as noted in HPI Dermatological ROS: negative for rash and skin lesion changes   Blood pressure 135/82, pulse 66, temperature 99.1 F (37.3 C), temperature source Oral, resp. rate 16, height 6\' 6"  (1.981 m), weight 96.163 kg (212 lb), SpO2 100 %.   Neurologic Examination:                                                                                                      General: NAD Mental Status: Alert, oriented to [facility], moth of November and year 2015.  thought content Tang[place].  Speech fluent without evidence of aphasia.  Able to follow 3 step commands without difficulty. Cranial Nerves: II: Discs flat bilaterally; Visual fields grossly normal,  pupils equal, round, reactive to light and accommodation III,IV, VI: ptosis not present, extra-ocular motions intact bilaterally V,VII: smile symmetric, facial light touch sensation normal bilaterally VIII: hearing normal bilaterally IX,X: gag reflex present XI: bilateral shoulder shrug XII: midline tongue extension without atrophy or fasciculations  Motor: Right : Upper extremity   5/5    Left:     Upper extremity   5/5  Lower extremity   5/5     Lower extremity   5/5 Tone and bulk:normal tone throughout; no atrophy noted Sensory: decreased sensation to PP/LT/vibration to mid calf bilaterally and no proprioception at his toes. HE states heh has no feeling in his feet however will withdrawal briskly when I assess his plantars.  Deep Tendon Reflexes:  Right: Upper Extremity   Left: Upper extremity   biceps (C-5 to C-6) 2/4   biceps (C-5 to C-6) 2/4 tricep (C7) 2/4    triceps (C7) 2/4 Brachioradialis (C6) 2/4  Brachioradialis (C6) 2/4  Lower Extremity Lower Extremity  quadriceps (L-2 to L-4) 2/4   quadriceps (L-2 to L-4) 2/4 Achilles (S1) 0/4   Achilles (S1) 0/4  Plantars: Right: downgoing   Left: downgoing Cerebellar: normal finger-to-nose,  normal heel-to-shin test Gait: not tested.  CV: pulses palpable throughout    Lab Results: Basic Metabolic Panel:  Recent Labs Lab 01/23/14 0405 01/23/14 1328  NA 134*  --   K 4.8  --   CL 95*  --   CO2 24  --   GLUCOSE 105*  --   BUN 9  --   CREATININE 0.60 0.65  CALCIUM 8.7  --   MG  --  2.0  PHOS  --  4.5    Liver Function Tests: No results for input(s): AST, ALT, ALKPHOS, BILITOT, PROT, ALBUMIN in the last 168 hours. No results for input(s): LIPASE, AMYLASE in the last 168 hours. No results for input(s): AMMONIA in the last 168 hours.  CBC:  Recent Labs Lab 01/23/14 0405 01/23/14 1328 01/24/14 0500  WBC 11.5* 10.1 9.9  NEUTROABS  --   --  5.6  HGB 12.7* 12.3* 12.2*  HCT  35.8* 35.1* 34.6*  MCV 98.6 99.7 97.5   PLT 169 162 142*    Cardiac Enzymes: No results for input(s): CKTOTAL, CKMB, CKMBINDEX, TROPONINI in the last 168 hours.  Lipid Panel: No results for input(s): CHOL, TRIG, HDL, CHOLHDL, VLDL, LDLCALC in the last 168 hours.  CBG: No results for input(s): GLUCAP in the last 168 hours.  Microbiology: Results for orders placed or performed during the hospital encounter of 01/23/14  MRSA PCR Screening     Status: None   Collection Time: 01/23/14 12:55 PM  Result Value Ref Range Status   MRSA by PCR NEGATIVE NEGATIVE Final    Comment:        The GeneXpert MRSA Assay (FDA approved for NASAL specimens only), is one component of a comprehensive MRSA colonization surveillance program. It is not intended to diagnose MRSA infection nor to guide or monitor treatment for MRSA infections.     Coagulation Studies: No results for input(s): LABPROT, INR in the last 72 hours.  Imaging: Ct Head Wo Contrast  01/23/2014   CLINICAL DATA:  History of seizures. Seizure yesterday. History of brain aneurysm.  EXAM: CT HEAD WITHOUT CONTRAST  TECHNIQUE: Contiguous axial images were obtained from the base of the skull through the vertex without intravenous contrast.  COMPARISON:  08/29/2013  FINDINGS: Mild diffuse cerebral atrophy. Ventricular dilatation consistent with central atrophy. Cavum septum pellucidum. Low-attenuation encephalomalacia in the frontal lobes bilaterally the consistent with old ischemic insult or posttraumatic change. No mass effect or midline shift. No abnormal extra-axial fluid collections. Gray-white matter junctions are distinct. Basal cisterns are not effaced. No evidence of acute intracranial hemorrhage. No depressed skull fractures. Old fracture deformity of the left medial orbital wall. It visualized paranasal sinuses and mastoid air cells are clear.  IMPRESSION: No acute intracranial abnormalities. Chronic atrophy. 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 No  change since previous study.   Electronically Signed   By: Burman Nieves M.D.   On: 01/23/2014 06:38   Mr Thoracic Spine Wo Contrast  01/23/2014   EXAM: MRI THORACIC AND LUMBAR SPINE WITHOUT CONTRAST  TECHNIQUE: Multiplanar and multiecho pulse sequences of the thoracic and lumbar spine were obtained without intravenous contrast.  COMPARISON:  None.  FINDINGS: MR THORACIC SPINE FINDINGS  The vertebral bodies are normally aligned with preservation of the normal thoracic kyphosis. No listhesis.  Signal intensity within the vertebral body bone marrow is normal. No focal osseous lesion.  Small chronic degenerative endplate Schmorl's nodes seen at the superior endplates of T5 through T9 and without significant height loss. Schmorl's node present at the inferior endplate of T12.  Signal intensity within the thoracic spinal cord is normal. No epidural fluid collection.  Paraspinous soft tissues within normal limits.  Mild degenerative disc bulging noted at C6-7 and C7-T1, incompletely evaluated on this exam. No significant canal stenosis.  Diffuse disc bulge with disc desiccation present at the T2-3 level. There is superimposed right foraminal disc protrusion resulting an moderate right foraminal narrowing. The central canal remains widely patent. No significant left foraminal narrowing.  At T3-4, there is a small left foraminal disc protrusion without significant stenosis.  No other significant degenerative disc disease identified within the thoracic spine.  MR LUMBAR SPINE FINDINGS  The vertebral bodies are normally aligned with preservation of the normal lumbar lordosis. Vertebral body heights are preserved. No acute fracture or or listhesis.  Bone marrow signal intensity mildly heterogeneous without focal osseous lesion. Heterogeneous signal intensity seen about the L4-5 and  L5-S1 intervertebral disc spaces, compatible with advanced degenerative endplate changes.  Conus medullaris terminates normally at the L1-2  level. Signal intensity within the visualized cord is normal. Nerve roots of the cauda equina within normal limits. No epidural collection.  Paraspinal soft tissues within normal limits.  L1-2: Mild disc bulge with disc desiccation. No focal disc protrusion or facet arthropathy. No canal or foraminal stenosis.  L2-3: Mild disc desiccation without focal disc protrusion or significant disc bulge. No significant facet arthropathy. No canal or neural foraminal stenosis.  L3-4: Diffuse disc bulge with disc desiccation. There is a a superimposed shallow right foraminal disc protrusion and/or osteophyte, resulting and mild right foraminal narrowing and closely approximating the exiting right L3 nerve root. Prominent anterior osteophytic spurring present as well. Mild bilateral facet hypertrophy. Central canal is widely patent without significant lateral recess stenosis.  L4-5: Diffuse disc bulge with disc desiccation and intervertebral disc space narrowing. Advanced degenerative endplate changes present. No definite focal disc protrusion. There is moderate bilateral facet arthrosis with mild ligamentum flavum hypertrophy. There is mild narrowing of the lateral recesses bilaterally. The bulging disc closely approximates the exiting L4 nerve roots bilaterally as they course out of the neural foramina. There is moderate left with mild right foraminal narrowing.  L5-S1: Degenerative intervertebral disc space narrowing with disc desiccation and disc bulge. Advanced degenerative endplate changes present. Moderate bilateral facet hypertrophy. There is a superimposed shallow right foraminal disc protrusion, closely approximating the exiting right L5 nerve root. The central canal remains widely patent. There is mild to moderate bilateral foraminal narrowing.  IMPRESSION: MR THORACIC SPINE IMPRESSION  1. No evidence for cord compression, significant canal stenosis, or other acute abnormality within the thoracic spine. 2. Disc bulge  with superimposed right foraminal disc protrusion at T2-3 with resultant moderate right foraminal stenosis. 3. Small left foraminal disc protrusion at T3-4 without significant stenosis.  MR LUMBAR SPINE IMPRESSION  1. No evidence for cord compression, significant canal stenosis, or other acute abnormality within the lumbar spine. 2. Advanced degenerative disc disease with disc bulging at L4-5 and L5-S1 without significant canal stenosis. 3. Superimposed small right foraminal disc protrusions at L3-4 and L5-S1, closely approximating the exiting right L3 and L5 nerve roots respectively. These could potentially result in nerve root irritation. 4. Additional multilevel degenerative changes as above.   Electronically Signed   By: Rise MuBenjamin  McClintock M.D.   On: 01/23/2014 21:51   Mr Lumbar Spine Wo Contrast  01/23/2014   EXAM: MRI THORACIC AND LUMBAR SPINE WITHOUT CONTRAST  TECHNIQUE: Multiplanar and multiecho pulse sequences of the thoracic and lumbar spine were obtained without intravenous contrast.  COMPARISON:  None.  FINDINGS: MR THORACIC SPINE FINDINGS  The vertebral bodies are normally aligned with preservation of the normal thoracic kyphosis. No listhesis.  Signal intensity within the vertebral body bone marrow is normal. No focal osseous lesion.  Small chronic degenerative endplate Schmorl's nodes seen at the superior endplates of T5 through T9 and without significant height loss. Schmorl's node present at the inferior endplate of T12.  Signal intensity within the thoracic spinal cord is normal. No epidural fluid collection.  Paraspinous soft tissues within normal limits.  Mild degenerative disc bulging noted at C6-7 and C7-T1, incompletely evaluated on this exam. No significant canal stenosis.  Diffuse disc bulge with disc desiccation present at the T2-3 level. There is superimposed right foraminal disc protrusion resulting an moderate right foraminal narrowing. The central canal remains widely patent. No  significant left foraminal narrowing.  At T3-4, there is a small left foraminal disc protrusion without significant stenosis.  No other significant degenerative disc disease identified within the thoracic spine.  MR LUMBAR SPINE FINDINGS  The vertebral bodies are normally aligned with preservation of the normal lumbar lordosis. Vertebral body heights are preserved. No acute fracture or or listhesis.  Bone marrow signal intensity mildly heterogeneous without focal osseous lesion. Heterogeneous signal intensity seen about the L4-5 and L5-S1 intervertebral disc spaces, compatible with advanced degenerative endplate changes.  Conus medullaris terminates normally at the L1-2 level. Signal intensity within the visualized cord is normal. Nerve roots of the cauda equina within normal limits. No epidural collection.  Paraspinal soft tissues within normal limits.  L1-2: Mild disc bulge with disc desiccation. No focal disc protrusion or facet arthropathy. No canal or foraminal stenosis.  L2-3: Mild disc desiccation without focal disc protrusion or significant disc bulge. No significant facet arthropathy. No canal or neural foraminal stenosis.  L3-4: Diffuse disc bulge with disc desiccation. There is a a superimposed shallow right foraminal disc protrusion and/or osteophyte, resulting and mild right foraminal narrowing and closely approximating the exiting right L3 nerve root. Prominent anterior osteophytic spurring present as well. Mild bilateral facet hypertrophy. Central canal is widely patent without significant lateral recess stenosis.  L4-5: Diffuse disc bulge with disc desiccation and intervertebral disc space narrowing. Advanced degenerative endplate changes present. No definite focal disc protrusion. There is moderate bilateral facet arthrosis with mild ligamentum flavum hypertrophy. There is mild narrowing of the lateral recesses bilaterally. The bulging disc closely approximates the exiting L4 nerve roots bilaterally  as they course out of the neural foramina. There is moderate left with mild right foraminal narrowing.  L5-S1: Degenerative intervertebral disc space narrowing with disc desiccation and disc bulge. Advanced degenerative endplate changes present. Moderate bilateral facet hypertrophy. There is a superimposed shallow right foraminal disc protrusion, closely approximating the exiting right L5 nerve root. The central canal remains widely patent. There is mild to moderate bilateral foraminal narrowing.  IMPRESSION: MR THORACIC SPINE IMPRESSION  1. No evidence for cord compression, significant canal stenosis, or other acute abnormality within the thoracic spine. 2. Disc bulge with superimposed right foraminal disc protrusion at T2-3 with resultant moderate right foraminal stenosis. 3. Small left foraminal disc protrusion at T3-4 without significant stenosis.  MR LUMBAR SPINE IMPRESSION  1. No evidence for cord compression, significant canal stenosis, or other acute abnormality within the lumbar spine. 2. Advanced degenerative disc disease with disc bulging at L4-5 and L5-S1 without significant canal stenosis. 3. Superimposed small right foraminal disc protrusions at L3-4 and L5-S1, closely approximating the exiting right L3 and L5 nerve roots respectively. These could potentially result in nerve root irritation. 4. Additional multilevel degenerative changes as above.   Electronically Signed   By: Rise Mu M.D.   On: 01/23/2014 21:51       Assessment and plan per attending neurologist  Felicie Morn PA-C [facility] [phone]  01/24/2014, 12:36 PM   Assessment/Plan: 53 YO male with history of seizure disorder S/P surgery for intracranial aneurysm. Presenting to hospital after unwitnessed seizure and 2 days of decreased sensation and difficulty with gait. Exam shows decreased Cold sensation, vibratory sensation, PP and no proprioception from mid calf to feet bilterally. CT head, MRI  thoracic and lumbar spine show no etiology for the above symptoms. patient symptoms likely secondary to his peripheral neuropathy and worsened by his underlying UTI.   Recommend: 1) Restart Keppra at home dose. 2) Consult  pharmacy to follow Tegretol level and make appropriate adjustments 3) B12, Folate, A1c, 24 hour urine heavy metal to evaluate for neuropathy 4) multivitamin 5) PT to evaluate and treat 6) Treat underlying UTI  I personally participated in this patient's evaluation and management, including formulating above clinical impression and management recommendations.  Venetia MaxonR Chrisoula Zegarra M.D. [facility] [phone]

## 2014-01-25 MED ORDER — LEVETIRACETAM 1000 MG PO TABS: 1000.0000 mg | ORAL_TABLET | Freq: Two times a day (BID) | ORAL | Status: DC

## 2014-01-25 MED ORDER — CIPROFLOXACIN HCL 500 MG PO TABS: 500.0000 mg | ORAL_TABLET | Freq: Two times a day (BID) | ORAL | Status: DC

## 2014-01-25 MED ORDER — CARBAMAZEPINE 200 MG PO TABS: 400.0000 mg | ORAL_TABLET | Freq: Three times a day (TID) | ORAL | Status: DC

## 2014-01-25 MED ORDER — FOLIC ACID 1 MG PO TABS: 1.0000 mg | ORAL_TABLET | Freq: Every day | ORAL | Status: DC

## 2014-01-25 MED ORDER — THIAMINE HCL 100 MG PO TABS: 100.0000 mg | ORAL_TABLET | Freq: Every day | ORAL | Status: DC

## 2014-01-25 NOTE — Progress Notes (Signed)
Pt given discharge instructions by MD. Prescription for cipro given to patient. Nighttime dose of tegretol and keppra and cipro given. Landlord, Cletis picked up patient and took him home.

## 2014-01-25 NOTE — Progress Notes (Signed)
Nutrition Brief Note  Patient identified on the Malnutrition Screening Tool (MST) Report  Wt Readings from Last 15 Encounters:  01/23/14 212 lb (96.163 kg)  09/04/13 179 lb 9.6 oz (81.466 kg)  11/28/12 201 lb (91.173 kg)  10/25/12 180 lb 8.9 oz (81.9 kg)  12/28/11 195 lb (88.451 kg)    Body mass index is 24.5 kg/(m^2). Patient meets criteria for normal body weight based on current BMI.   Current diet order is regular, patient is consuming approximately 100% of meals at this time. Labs and medications reviewed.   No nutrition interventions warranted at this time. If nutrition issues arise, please consult RD.   Emmaline KluverHaley Anajulia Leyendecker RD, LDN

## 2014-01-25 NOTE — Progress Notes (Addendum)
Pt removed telemetry and refuses to put it back on.  Also, MD aware that patient believes he is leaving tomorrow. MD stated he would come and speak with patient.

## 2014-01-25 NOTE — Progress Notes (Signed)
Physical Therapy Evaluation Patient Details Name: [name] [name] MRN: [id number] DOB: Mar 31, 1960 Today's Date: 01/25/2014   History of Present Illness  53 yo male, admitted to emergency department on 01/23/14, following reported seizures on 11/4 and 11/5. Hx of aneurysm with repair, and EtOH abuse, acute UTI as well.  Patient reporting weakness and unsteadiness affecting gait and balance.    Clinical Impression  Patient seen for brief evaluation, states his head hurts, his back hurts, and legs are shakey and numb.  Patient wants cane for home use to keep him steady.  Patient does present with balance instability, but by patient report, is at his normal level again, not as weak as after seizures.  Patient shown RW for hospital room use for stability, initially not interested, but states he may use it to help him get to the bathroom.  Patient may benefit from cane for stability, plan to complete training and trial before formal recommendation given.  Patient may benefit from home health PT services to follow up after hospitalization.    Follow Up Recommendations Home health PT    Equipment Recommendations  Cane ((pending trial))    Recommendations for Other Services       Precautions / Restrictions Precautions Precautions: Fall Restrictions Weight Bearing Restrictions: No      Mobility  Bed Mobility Overal bed mobility: Independent                Transfers Overall transfer level: Independent Equipment used: None             General transfer comment: Patient feels unsteady, states he needs a cane he can take home.  Showed him FWW for use in facility, initially stated he did not want that, but agreeable to use it in room if feeling unsteady.  Ambulation/Gait Ambulation/Gait assistance: Supervision Ambulation Distance (Feet): 20 Feet Assistive device: None Gait Pattern/deviations: Wide base of support     General Gait Details: Patient declined to walk with RW on  assessment, wants cane.  Stairs            Wheelchair Mobility    Modified Rankin (Stroke Patients Only)       Balance Overall balance assessment: Modified Independent         Standing balance support: No upper extremity supported Standing balance-[name]: Fair                               Pertinent Vitals/Pain      Home Living Family/patient expects to be discharged to:: Private residence Living Arrangements: Alone Available Help at Discharge: Family;Available PRN/intermittently Type of Home: Apartment Home Access: Stairs to enter   Entrance Stairs-Number of Steps: 7 Home Layout: One level Home Equipment: None Additional Comments: Patient states he needs a cane.    Prior Function Level of Independence: Independent               Hand Dominance        Extremity/Trunk Assessment   Upper Extremity Assessment: Overall WFL for tasks assessed           Lower Extremity Assessment: RLE deficits/detail;LLE deficits/detail RLE Deficits / Details: Patient reports "shakey" in thigh, and "numb and cold" below knee Bilaterally LLE Deficits / Details: Patient reports "shakey" in thigh, and "numb and cold" below knee Bilaterally     Communication   Communication: No difficulties  Cognition Arousal/Alertness: Awake/alert Behavior During Therapy: WFL for tasks assessed/performed Overall Cognitive Status:  Within Functional Limits for tasks assessed                      General Comments      Exercises        Assessment/Plan    PT Assessment Patient needs continued PT services;All further PT needs can be met in the next venue of care  PT Diagnosis Abnormality of gait   PT Problem List Decreased coordination;Decreased balance;Decreased activity tolerance;Impaired sensation;Pain  PT Treatment Interventions DME instruction;Gait training;Balance training;Neuromuscular re-education;Patient/family education   PT Goals (Current  goals can be found in the Care Plan section) Acute Rehab PT Goals Patient Stated Goal: I need a cane to go home. PT Goal Formulation: With patient Time For Goal Achievement: 01/30/14 Potential to Achieve Goals: Good    Frequency Other (Comment) (1 more visit for cane trial.)   Barriers to discharge   Decreased assistive device (cane) per patient.    Co-evaluation               End of Session   Activity Tolerance: Patient limited by pain;Treatment limited secondary to agitation Patient left: in bed;with call bell/phone within reach Nurse Communication: Mobility status;Patient requests pain meds (Patient request for cane)         Time: 1700-1715 PT Time Calculation (min): 15 min   Charges:   PT Evaluation $Initial PT Evaluation Tier I: 1 Procedure     PT G CodesZenia Resides, Anona Giovannini L 2014/01/30, 6:04 PM

## 2014-01-25 NOTE — Progress Notes (Signed)
Pt is being verbally aggressive. MD paged and discharge order given.

## 2014-01-25 NOTE — Progress Notes (Signed)
Subjective: No complaints today. Pt will like to stay one more night because he feels he will be able to arrange transportation home tomorrow rather than today. His landlord will be taking him home and is coming today to drop off some cloths for pt, but pt still wants to stay tonight.  No events overnight.   Objective: Vital signs in last 24 hours: Filed Vitals:   01/24/14 1353 01/24/14 2029 01/25/14 0555 01/25/14 1337  BP: 143/70 143/75 144/90 123/60  Pulse: 65 75 73 59  Temp: 99.1 F (37.3 C) 99 F (37.2 C) 99.1 F (37.3 C) 99 F (37.2 C)  TempSrc: Oral Oral Oral Oral  Resp: 18 19 15 16   Height:      Weight:      SpO2: 97% 100% 100% 100%   Weight change:   Intake/Output Summary (Last 24 hours) at 01/25/14 1408 Last data filed at 01/25/14 1338  Gross per 24 hour  Intake    500 ml  Output   2250 ml  Net  -1750 ml   PE General: NAD, lying in bed in no distress. HEENT: AT, [place], poor dentition overall. Lungs: Nomal work of breathing,  Cardiac: No added sounds GI: Soft, non tender. Neuro: Normal Strenght upper and lower extremity.  Lab Results: Basic Metabolic Panel:  Recent Labs Lab 01/23/14 0405 01/23/14 1328 01/24/14 1325  NA 134*  --  133*  K 4.8  --  3.9  CL 95*  --  94*  CO2 24  --  25  GLUCOSE 105*  --  109*  BUN 9  --  6  CREATININE 0.60 0.65 0.63  CALCIUM 8.7  --  8.7  MG  --  2.0  --   PHOS  --  4.5  --    CBC:  Recent Labs Lab 01/23/14 1328 01/24/14 0500  WBC 10.1 9.9  NEUTROABS  --  5.6  HGB 12.3* 12.2*  HCT 35.1* 34.6*  MCV 99.7 97.5  PLT 162 142*   Thyroid Function Tests:  Recent Labs Lab 01/24/14 0500  TSH 3.550   Urine Drug Screen: Drugs of Abuse     Component Value Date/Time   LABOPIA NONE DETECTED 01/23/2014 1215   COCAINSCRNUR NONE DETECTED 01/23/2014 1215   LABBENZ NONE DETECTED 01/23/2014 1215   AMPHETMU NONE DETECTED 01/23/2014 1215   THCU POSITIVE* 01/23/2014 1215   LABBARB NONE DETECTED 01/23/2014 1215      Alcohol Level:  Recent Labs Lab 01/23/14 0405  ETH <11   Urinalysis:  Recent Labs Lab 01/23/14 1209  COLORURINE AMBER*  LABSPEC 1.019  PHURINE 6.0  GLUCOSEU NEGATIVE  HGBUR TRACE*  BILIRUBINUR SMALL*  KETONESUR NEGATIVE  PROTEINUR NEGATIVE  UROBILINOGEN >8.0*  NITRITE POSITIVE*  LEUKOCYTESUR LARGE*    Micro Results: Recent Results (from the past 240 hour(s))  MRSA PCR Screening     Status: None   Collection Time: 01/23/14 12:55 PM  Result Value Ref Range Status   MRSA by PCR NEGATIVE NEGATIVE Final    Comment:        The GeneXpert MRSA Assay (FDA approved for NASAL specimens only), is one component of a comprehensive MRSA colonization surveillance program. It is not intended to diagnose MRSA infection nor to guide or monitor treatment for MRSA infections.    Studies/Results: Mr Thoracic Spine Wo Contrast  01/23/2014   EXAM: MRI THORACIC AND LUMBAR SPINE WITHOUT CONTRAST  TECHNIQUE:  IMPRESSION: MR THORACIC SPINE IMPRESSION  1. No evidence for cord compression, significant  canal stenosis, or other acute abnormality within the thoracic spine. 2. Disc bulge with superimposed right foraminal disc protrusion at T2-3 with resultant moderate right foraminal stenosis. 3. Small left foraminal disc protrusion at T3-4 without significant stenosis.  MR LUMBAR SPINE IMPRESSION  1. No evidence for cord compression, significant canal stenosis, or other acute abnormality within the lumbar spine. 2. Advanced degenerative disc disease with disc bulging at L4-5 and L5-S1 without significant canal stenosis. 3. Superimposed small right foraminal disc protrusions at L3-4 and L5-S1, closely approximating the exiting right L3 and L5 nerve roots respectively. These could potentially result in nerve root irritation. 4. Additional multilevel degenerative changes as above.   Electronically Signed   By: Rise MuBenjamin  McClintock M.D.   On: 01/23/2014 21:51   Mr Lumbar Spine Wo Contrast  01/23/2014    EXAM: MRI THORACIC AND LUMBAR SPINE WITHOUT CONTRAST  TECHNIQUE:   IMPRESSION: MR THORACIC SPINE IMPRESSION  1. No evidence for cord compression, significant canal stenosis, or other acute abnormality within the thoracic spine. 2. Disc bulge with superimposed right foraminal disc protrusion at T2-3 with resultant moderate right foraminal stenosis. 3. Small left foraminal disc protrusion at T3-4 without significant stenosis.  MR LUMBAR SPINE IMPRESSION  1. No evidence for cord compression, significant canal stenosis, or other acute abnormality within the lumbar spine. 2. Advanced degenerative disc disease with disc bulging at L4-5 and L5-S1 without significant canal stenosis. 3. Superimposed small right foraminal disc protrusions at L3-4 and L5-S1, closely approximating the exiting right L3 and L5 nerve roots respectively. These could potentially result in nerve root irritation. 4. Additional multilevel degenerative changes as above.   Electronically Signed   By: Rise MuBenjamin  McClintock M.D.   On: 01/23/2014 21:51   Medications: I have reviewed the patient's current medications. Scheduled Meds: . carbamazepine  400 mg Oral TID  . ciprofloxacin  500 mg Oral BID  . folic acid  1 mg Oral Daily  . heparin  5,000 Units Subcutaneous 3 times per day  . levETIRAcetam  1,000 mg Oral BID  . multivitamin with minerals  1 tablet Oral Daily  . nicotine  14 mg Transdermal Daily  . sodium chloride  3 mL Intravenous Q12H  . thiamine  100 mg Oral Daily   Continuous Infusions:  PRN Meds:.HYDROcodone-acetaminophen, ondansetron **OR** ondansetron (ZOFRAN) IV Assessment/Plan: Principal Problem:   Seizures Active Problems:   Chronic liver disease   Hepatitis C   Convulsions/seizures   Essential hypertension, benign   Bilateral leg weakness   S/P cerebral aneurysm repair   Acute onset bilateral LE weakness after seizure-- Resolved. Pt says he is back to baseline. Likely due to [place] Paralysis as pt's history is  consistent w/ seizure (pt lost consciousness and woke up in a pool of his urine) after which he went to the ED. MR negative for cord compression. HIV negative.  - Neurology recs appreciated, restart Keppra 1000mg  BID, Carmazepine 400mg  TID - Keppra level pending, B12, folate, 24hr heavy metal to evaluate neuropathy pending - Discharge home today, pending when pts land lord comes in to drop pts things off.  UTI- UA positive for nitrites and LE, TNTC WBCs - urine culture pending - cipro 500mg  BID  Dispo: Discharge home today.  The patient does not have a current PCP (No primary care provider on file.) and [facility].  The patient does not have transportation limitations that hinder transportation to clinic appointments.  .Services Needed at time of  discharge: Y = Yes, Blank = No PT:   OT:   RN:   Equipment:   Other:     LOS: 2 days   Onnie BoerEjiroghene E Taleah Bellantoni, MD 01/25/2014, 2:08 PM

## 2014-01-26 NOTE — Discharge Summary (Signed)
Name: [name] [name] MRN: [id number] DOB: 1960/05/30 53 y.o. PCP: No primary care provider on file.  Date of Admission: 01/23/2014  2:25 AM Date of Discharge: 01/27/2014 Attending Physician: No att. providers found  Discharge Diagnosis:  Principal Problem:   Seizures Active Problems:   Chronic liver disease   Hepatitis C   Convulsions/seizures   Essential hypertension, benign   Bilateral leg weakness   S/P cerebral aneurysm repair  Discharge Medications:   Medication List    TAKE these medications        carbamazepine 200 MG tablet  Commonly known as:  TEGRETOL  Take 2 tablets (400 mg total) by mouth 3 (three) times daily.     ciprofloxacin 500 MG tablet  Commonly known as:  CIPRO  Take 1 tablet (500 mg total) by mouth 2 (two) times daily.     folic acid 1 MG tablet  Commonly known as:  FOLVITE  Take 1 tablet (1 mg total) by mouth daily.     levETIRAcetam 1000 MG tablet  Commonly known as:  KEPPRA  Take 1,000 mg by mouth 2 (two) times daily.     metoprolol 50 MG tablet  Commonly known as:  LOPRESSOR  Take 0.5 tablets (25 mg total) by mouth 2 (two) times daily.     thiamine 100 MG tablet  Take 1 tablet (100 mg total) by mouth daily.     traMADol 50 MG tablet  Commonly known as:  ULTRAM  Take 1 tablet (50 mg total) by mouth every 6 (six) hours as needed.        Disposition and follow-up:   [name] [name] was discharged from [facility] in Good condition.  At the hospital follow up visit please address:  1.  If patient has gotten his meds and is taking them- keppra 1000mg  BID and Carbamazepine- 400mg  TID. Also compliant with his Ciprofloxacin- last dose- . Also pt expressed he will like to establish care else where and his landlord who is his case manager says he is setting this up.  2.  Labs / imaging needed at time of follow-up:   3.  Pending labs/ test needing follow-up: Heavy metal screen, Arsenic   Follow-up Appointments:  Please follow up with your primary care doctor. We sent the front desk a message to set up an appointment for you to follow up with [place] one time. Then subsequent follow up will be with your PCp who will then refer you to a neurologist.   Discharge Instructions:     Discharge Instructions    Call MD for:  persistant dizziness or light-headedness    Complete by:  As directed      Call MD for:  severe uncontrolled pain    Complete by:  As directed      Diet - low sodium heart healthy    Complete by:  As directed      Discharge instructions    Complete by:  As directed   We will call you with an appointment to follow up with [place] next week, just to be sure you are doing all right and getting all your medications as prescribed. Then follow up with your Primary care doctor and then you will be set up with a brain doctor. This is so that you do not continue to have seizures.   Also please take your Tegretol also known as carbamazepine- 400mg  three times a day. Also please please take your keppra also known as levitiracetam  1000mg  twice a day. If you have any confusion, please call for clarification or talk to your pharmacist.  For your urianry tract infection, take one tablet two times a day for 5 more days.     Driving Restrictions    Complete by:  As directed   Till you see your brain doctor and he/she says its is okay for you to drive.     Increase activity slowly    Complete by:  As directed            Consultations:  Neurology  Procedures Performed:  Ct Head Wo [name]  01/23/2014   CLINICAL DATA:  History of seizures. Seizure yesterday. History of brain aneurysm.  EXAM: CT HEAD WITHOUT [name]  TECHNIQUE: Contiguous axial images were obtained from the base of the skull through the vertex without intravenous [name].  COMPARISON:  08/29/2013  FINDINGS: Mild diffuse cerebral atrophy. Ventricular dilatation consistent with central atrophy. Cavum septum pellucidum. Low-attenuation  encephalomalacia in the frontal lobes bilaterally the consistent with old ischemic insult or posttraumatic change. No mass effect or midline shift. No abnormal extra-axial fluid collections. Gray-white matter junctions are distinct. Basal cisterns are not effaced. No evidence of acute intracranial hemorrhage. No depressed skull fractures. Old fracture deformity of the left medial orbital wall. It visualized paranasal sinuses and mastoid air cells are clear.  IMPRESSION: No acute intracranial abnormalities. Chronic atrophy. 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 No change since previous study.   Electronically Signed   By: Burman NievesWilliam  Stevens M.D.   On: 01/23/2014 06:38   [name] [name]  01/23/2014   EXAM: MRI THORACIC AND LUMBAR SPINE WITHOUT [name]  TECHNIQUE: Multiplanar and multiecho pulse sequences of the thoracic and lumbar spine were obtained without intravenous [name].  COMPARISON:  None.  FINDINGS: [name] THORACIC SPINE FINDINGS  The vertebral bodies are normally aligned with preservation of the normal thoracic kyphosis. No listhesis.  Signal intensity within the vertebral body bone marrow is normal. No focal osseous lesion.  Small chronic degenerative endplate Schmorl's nodes seen at the superior endplates of T5 through T9 and without significant height loss. Schmorl's node present at the inferior endplate of T12.  Signal intensity within the thoracic spinal cord is normal. No epidural fluid collection.  Paraspinous soft tissues within normal limits.  Mild degenerative disc bulging noted at C6-7 and C7-T1, incompletely evaluated on this exam. No significant canal stenosis.  Diffuse disc bulge with disc desiccation present at the T2-3 level. There is superimposed right foraminal disc protrusion resulting an moderate right foraminal narrowing. The central canal remains widely patent. No significant left foraminal narrowing.  At T3-4, there is a small left foraminal disc  protrusion without significant stenosis.  No other significant degenerative disc disease identified within the thoracic spine.  [name] LUMBAR SPINE FINDINGS  The vertebral bodies are normally aligned with preservation of the normal lumbar lordosis. Vertebral body heights are preserved. No acute fracture or or listhesis.  Bone marrow signal intensity mildly heterogeneous without focal osseous lesion. Heterogeneous signal intensity seen about the L4-5 and L5-S1 intervertebral disc spaces, compatible with advanced degenerative endplate changes.  Conus medullaris terminates normally at the L1-2 level. Signal intensity within the visualized cord is normal. Nerve roots of the cauda equina within normal limits. No epidural collection.  Paraspinal soft tissues within normal limits.  L1-2: Mild disc bulge with disc desiccation. No focal disc protrusion or facet arthropathy. No canal or foraminal stenosis.  L2-3: Mild disc desiccation without focal  disc protrusion or significant disc bulge. No significant facet arthropathy. No canal or neural foraminal stenosis.  L3-4: Diffuse disc bulge with disc desiccation. There is a a superimposed shallow right foraminal disc protrusion and/or osteophyte, resulting and mild right foraminal narrowing and closely approximating the exiting right L3 nerve root. Prominent anterior osteophytic spurring present as well. Mild bilateral facet hypertrophy. Central canal is widely patent without significant lateral recess stenosis.  L4-5: Diffuse disc bulge with disc desiccation and intervertebral disc space narrowing. Advanced degenerative endplate changes present. No definite focal disc protrusion. There is moderate bilateral facet arthrosis with mild ligamentum flavum hypertrophy. There is mild narrowing of the lateral recesses bilaterally. The bulging disc closely approximates the exiting L4 nerve roots bilaterally as they course out of the neural foramina. There is moderate left with mild right  foraminal narrowing.  L5-S1: Degenerative intervertebral disc space narrowing with disc desiccation and disc bulge. Advanced degenerative endplate changes present. Moderate bilateral facet hypertrophy. There is a superimposed shallow right foraminal disc protrusion, closely approximating the exiting right L5 nerve root. The central canal remains widely patent. There is mild to moderate bilateral foraminal narrowing.  IMPRESSION: [name] THORACIC SPINE IMPRESSION  1. No evidence for cord compression, significant canal stenosis, or other acute abnormality within the thoracic spine. 2. Disc bulge with superimposed right foraminal disc protrusion at T2-3 with resultant moderate right foraminal stenosis. 3. Small left foraminal disc protrusion at T3-4 without significant stenosis.  [name] LUMBAR SPINE IMPRESSION  1. No evidence for cord compression, significant canal stenosis, or other acute abnormality within the lumbar spine. 2. Advanced degenerative disc disease with disc bulging at L4-5 and L5-S1 without significant canal stenosis. 3. Superimposed small right foraminal disc protrusions at L3-4 and L5-S1, closely approximating the exiting right L3 and L5 nerve roots respectively. These could potentially result in nerve root irritation. 4. Additional multilevel degenerative changes as above.   Electronically Signed   By: Rise Mu M.D.   On: 01/23/2014 21:51   [name] Lumbar Spine Wo [name]  01/23/2014   EXAM: MRI THORACIC AND LUMBAR SPINE WITHOUT [name]  TECHNIQUE: Multiplanar and multiecho pulse sequences of the thoracic and lumbar spine were obtained without intravenous [name].  COMPARISON:  None.  FINDINGS: [name] THORACIC SPINE FINDINGS  The vertebral bodies are normally aligned with preservation of the normal thoracic kyphosis. No listhesis.  Signal intensity within the vertebral body bone marrow is normal. No focal osseous lesion.  Small chronic degenerative endplate Schmorl's nodes seen at the superior  endplates of T5 through T9 and without significant height loss. Schmorl's node present at the inferior endplate of T12.  Signal intensity within the thoracic spinal cord is normal. No epidural fluid collection.  Paraspinous soft tissues within normal limits.  Mild degenerative disc bulging noted at C6-7 and C7-T1, incompletely evaluated on this exam. No significant canal stenosis.  Diffuse disc bulge with disc desiccation present at the T2-3 level. There is superimposed right foraminal disc protrusion resulting an moderate right foraminal narrowing. The central canal remains widely patent. No significant left foraminal narrowing.  At T3-4, there is a small left foraminal disc protrusion without significant stenosis.  No other significant degenerative disc disease identified within the thoracic spine.  [name] LUMBAR SPINE FINDINGS  The vertebral bodies are normally aligned with preservation of the normal lumbar lordosis. Vertebral body heights are preserved. No acute fracture or or listhesis.  Bone marrow signal intensity mildly heterogeneous without focal osseous lesion. Heterogeneous signal intensity seen about the L4-5  and L5-S1 intervertebral disc spaces, compatible with advanced degenerative endplate changes.  Conus medullaris terminates normally at the L1-2 level. Signal intensity within the visualized cord is normal. Nerve roots of the cauda equina within normal limits. No epidural collection.  Paraspinal soft tissues within normal limits.  L1-2: Mild disc bulge with disc desiccation. No focal disc protrusion or facet arthropathy. No canal or foraminal stenosis.  L2-3: Mild disc desiccation without focal disc protrusion or significant disc bulge. No significant facet arthropathy. No canal or neural foraminal stenosis.  L3-4: Diffuse disc bulge with disc desiccation. There is a a superimposed shallow right foraminal disc protrusion and/or osteophyte, resulting and mild right foraminal narrowing and closely  approximating the exiting right L3 nerve root. Prominent anterior osteophytic spurring present as well. Mild bilateral facet hypertrophy. Central canal is widely patent without significant lateral recess stenosis.  L4-5: Diffuse disc bulge with disc desiccation and intervertebral disc space narrowing. Advanced degenerative endplate changes present. No definite focal disc protrusion. There is moderate bilateral facet arthrosis with mild ligamentum flavum hypertrophy. There is mild narrowing of the lateral recesses bilaterally. The bulging disc closely approximates the exiting L4 nerve roots bilaterally as they course out of the neural foramina. There is moderate left with mild right foraminal narrowing.  L5-S1: Degenerative intervertebral disc space narrowing with disc desiccation and disc bulge. Advanced degenerative endplate changes present. Moderate bilateral facet hypertrophy. There is a superimposed shallow right foraminal disc protrusion, closely approximating the exiting right L5 nerve root. The central canal remains widely patent. There is mild to moderate bilateral foraminal narrowing.  IMPRESSION: [name] THORACIC SPINE IMPRESSION  1. No evidence for cord compression, significant canal stenosis, or other acute abnormality within the thoracic spine. 2. Disc bulge with superimposed right foraminal disc protrusion at T2-3 with resultant moderate right foraminal stenosis. 3. Small left foraminal disc protrusion at T3-4 without significant stenosis.  [name] LUMBAR SPINE IMPRESSION  1. No evidence for cord compression, significant canal stenosis, or other acute abnormality within the lumbar spine. 2. Advanced degenerative disc disease with disc bulging at L4-5 and L5-S1 without significant canal stenosis. 3. Superimposed small right foraminal disc protrusions at L3-4 and L5-S1, closely approximating the exiting right L3 and L5 nerve roots respectively. These could potentially result in nerve root irritation. 4. Additional  multilevel degenerative changes as above.   Electronically Signed   By: Rise MuBenjamin  McClintock M.D.   On: 01/23/2014 21:51    2D Echo: None  Cardiac Cath: None  Admission HPI: Chief Complaint: bilateral leg weakness and suspected seizure  History of Present Illness: [name]. [name] is a 53 yo man pmh cerebral aneurysm s/p [place] in 2007 [place], [place], seizure disorder, HTN, and alcoholism presents from home with urinary incontinence and le weakness after a suspected seizure. The patient states that for the past 2 days he has had sudden acute onset leg weakness "like if I am in the cold numb feeling." he has not been able to stand or use the bathroom for the 2 days as well and has been ambulating on his knees to get around. He doesn't have any shooting pain or ulcerations on his leg and has not had any upper extremity weakness, facial droop, dysphagia, or dysarthria. The patient lives alone and woke up this AM trying to get to the bathroom given the urge to urinate but was unable to stand on his legs (as for the past 2 days) when he lost consciousness and found himself in a pool of urine.  He states that he has had the urge to urinate for the past 2 days but "just can't do it." he does have some low back pain that has worsened over this time as well. He denies any trauma but is not sure with the loss of consciousness and the fact he lives alone. He states he smokes at least 1/4 ppd and his last drink was a beer about 3 days ago. He denied any fever/chills, IVDU, nausea/vomiting/diarrhea, abdominal pain, stool incontinence, HA, or rashes. 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 He states has not had an established neurologist in the area and recent medication changes for his AEDs was done this month because of "it being too strong."  Hospital Course by problem list:   Acute onset bilateral LE weakness- Pt with new bilat LE weakness, with new sensory  deficits, inability to urinate, concerning for cord compression given acuity and physical exam findings. MRI -thoracic and lumber spine was unrevealing for etiology of acute symptoms. Differentials included Todds paralysis, as pt had woke up in a puddle of his urine, Multiple sclerosis, MRi findings not consistent. Gullian barre syndrome was considered but no recent GI, though urine cultures on admission revealed- 75, 000 colonies of Ecoli, pt was already on Ciprofloxacin based on Urinalysis, so we completed treatment.  Neurology was consulted, With negative imaging studies, recs were for  Starting home keppra, evaulate for peripheral neuropathy- B12, Folate and 24hr heavy metal.  On discharge pt was stable with complete resolution of his Lower extremity weakness and was walking without assistance.  #Seizure: pt with a known history in setting of coiled aneurysm. Pt doesn't have established neurology or PCP and was given tegretol previously. Pt had not been taking his keppra.  Level today supratheratputic at 17 concern that toxicity could be precipitating seizure or cerebellar disease. Imaging - CT head negative. UDS- Positive for [facility] only, HIV, keppra level, Phos, Mag, TSH, Lead, Cadmium, Arsenic, B12, Folate, RPR all unremarkable. Pt was loaded with keppra, and Cabamazepine was initailly held but later restarted. Pt had no more seizure episodes while on admission. Pt was discharged home on Carbamazepine- 400mg  TID, Keppra 1000mg  TID. We tried to arrange follow up at discharge, but pt refused and said he would rather go to his own Primary care doctor. Pts care taker was present and said he would arrange this. Sent messaqe to the front desk any way for pt to get a one time follow up appointment to help with transitioning of care, and ensure pt got his meds.  #Hepatitis C: pt with known Hepc genotype 1b back in 2011 and recheck 8/14 with quant 1.5 million. Pt has not undergone any treatment previously. Pt  needs to follow up with PCP. Pt voiced understanding.  Discharge Vitals:   BP 123/60 mmHg  Pulse 59  Temp(Src) 99 F (37.2 C) (Oral)  Resp 16  Ht 6\' 6"  (1.981 m)  Wt 212 lb (96.163 kg)  BMI 24.50 kg/m2  SpO2 100%  Discharge Labs:  No results found for this or any previous visit (from the past 24 hour(s)).  Services Ordered on Discharge: None Equipment Ordered on Discharge: None  Onnie Boer, MD 01/26/2014, 3:34 PM

## 2014-01-27 LAB — LEAD, BLOOD: Lead-Whole Blood: <2 ug/dL (ref <10)

## 2014-01-27 LAB — LEVETIRACETAM LEVEL: Levetiracetam Lvl: 6.7 ug/mL

## 2014-01-27 NOTE — Progress Notes (Signed)
CARE MANAGEMENT NOTE 01/27/2014  Patient:  [name] [name],[name] [name]   Account Number:  [id number]  Date Initiated:  01/27/2014  Documentation [facility]:  [facility]  Subjective/Objective Assessment:   seizures     Action/Plan:   Anticipated DC Date:  01/27/2014   Anticipated DC Plan:  HOME/SELF CARE      DC Planning Services  CM consult      Choice offered to / List presented to:             Status of service:  Completed, signed off Medicare Important Message given?   (If response is "NO", the following Medicare IM given date fields will be blank) Date Medicare IM given:   Medicare IM given by:   Date Additional Medicare IM given:   Additional Medicare IM given by:    Discharge Disposition:  HOME/SELF CARE  Per UR Regulation:    If discussed at Long Length of Stay Meetings, dates discussed:    Comments:  01/27/2014 1030 HH PT recommended. Medicaid visits limited. [facility] No HH arranged this hospitalization. Isidoro DonningAlesia Nea Gittens RN CCM Case Mgmt phone [phone]

## 2014-01-28 LAB — URINE CULTURE: Colony Count: 75000

## 2014-01-28 LAB — LEAD, URINE, 24 HOUR: Lead, 24H Ur: <10 mcg/L (ref <80)

## 2014-01-28 LAB — CADMIUM, URINE, 24 HOUR: CADMIUM 24H UR: 0.2 ug/L (ref <=5.0)

## 2014-01-29 LAB — HEAVY METALS, RANDOM URINE
ARSENICRANUR: 16 ug/g{creat} (ref <51)
Creatinine Random, Urine: 40.9 mg/dL (ref 20.0–370.0)

## 2014-02-03 LAB — HEAVY METALS SCREEN, URINE

## 2014-02-05 LAB — ARSENIC, URINE, 24 HOUR
ARSENIC UR: 6 ng/mL
Arsenic /G Creat, 24H Ur: 19 ug/g creat
Creatinine, Urine-mg/dL-ARSUR: 31 mg/dL (ref >50)

## 2014-02-22 ENCOUNTER — Other Ambulatory Visit: Payer: Self-pay | Admitting: Internal Medicine

## 2014-02-24 ENCOUNTER — Other Ambulatory Visit: Payer: Self-pay | Admitting: Internal Medicine

## 2014-03-10 ENCOUNTER — Encounter: Payer: Self-pay | Admitting: Internal Medicine

## 2014-03-10 ENCOUNTER — Ambulatory Visit: Payer: Medicaid Other | Attending: Internal Medicine | Admitting: Internal Medicine

## 2014-03-10 VITALS — BP 133/84 | HR 91 | Temp 98.0°F | Resp 16 | Wt 200.8 lb

## 2014-03-10 DIAGNOSIS — N390 Urinary tract infection, site not specified: Secondary | ICD-10-CM | POA: Insufficient documentation

## 2014-03-10 DIAGNOSIS — G40909 Epilepsy, unspecified, not intractable, without status epilepticus: Secondary | ICD-10-CM | POA: Insufficient documentation

## 2014-03-10 DIAGNOSIS — I671 Cerebral aneurysm, nonruptured: Secondary | ICD-10-CM | POA: Insufficient documentation

## 2014-03-10 DIAGNOSIS — B9620 Unspecified Escherichia coli [E. coli] as the cause of diseases classified elsewhere: Secondary | ICD-10-CM | POA: Insufficient documentation

## 2014-03-10 DIAGNOSIS — I10 Essential (primary) hypertension: Secondary | ICD-10-CM

## 2014-03-10 DIAGNOSIS — B182 Chronic viral hepatitis C: Secondary | ICD-10-CM | POA: Diagnosis not present

## 2014-03-10 DIAGNOSIS — F1020 Alcohol dependence, uncomplicated: Secondary | ICD-10-CM | POA: Diagnosis not present

## 2014-03-10 DIAGNOSIS — Z2821 Immunization not carried out because of patient refusal: Secondary | ICD-10-CM | POA: Insufficient documentation

## 2014-03-10 DIAGNOSIS — F17200 Nicotine dependence, unspecified, uncomplicated: Secondary | ICD-10-CM

## 2014-03-10 DIAGNOSIS — Z79899 Other long term (current) drug therapy: Secondary | ICD-10-CM | POA: Insufficient documentation

## 2014-03-10 DIAGNOSIS — F17210 Nicotine dependence, cigarettes, uncomplicated: Secondary | ICD-10-CM | POA: Diagnosis not present

## 2014-03-10 DIAGNOSIS — Z720 Tobacco use: Secondary | ICD-10-CM

## 2014-03-10 DIAGNOSIS — H538 Other visual disturbances: Secondary | ICD-10-CM | POA: Diagnosis not present

## 2014-03-10 DIAGNOSIS — R569 Unspecified convulsions: Secondary | ICD-10-CM

## 2014-03-10 DIAGNOSIS — Z87440 Personal history of urinary (tract) infections: Secondary | ICD-10-CM

## 2014-03-10 LAB — POCT URINALYSIS DIPSTICK
Blood, UA: NEGATIVE
Glucose, UA: NEGATIVE
Ketones, UA: NEGATIVE
Nitrite, UA: NEGATIVE
Protein, UA: 30
Spec Grav, UA: 1.025
Urobilinogen, UA: 0.2
pH, UA: 5.5

## 2014-03-10 LAB — COMPLETE METABOLIC PANEL WITH GFR
ALT: 26 U/L (ref 0–53)
AST: 26 U/L (ref 0–37)
Albumin: 4.2 g/dL (ref 3.5–5.2)
Alkaline Phosphatase: 88 U/L (ref 39–117)
BUN: 12 mg/dL (ref 6–23)
CHLORIDE: 106 meq/L (ref 96–112)
CO2: 25 mEq/L (ref 19–32)
Calcium: 9.2 mg/dL (ref 8.4–10.5)
Creat: 0.77 mg/dL (ref 0.50–1.35)
GFR, Est Non African American: >89 mL/min
GLUCOSE: 98 mg/dL (ref 70–99)
Potassium: 4.3 mEq/L (ref 3.5–5.3)
Sodium: 139 mEq/L (ref 135–145)
Total Bilirubin: 0.5 mg/dL (ref 0.2–1.2)
Total Protein: 7.8 g/dL (ref 6.0–8.3)

## 2014-03-10 MED ORDER — METOPROLOL TARTRATE 50 MG PO TABS: 25.0000 mg | ORAL_TABLET | Freq: Two times a day (BID) | ORAL | Status: DC

## 2014-03-10 MED ORDER — LEVETIRACETAM 1000 MG PO TABS: 1000.0000 mg | ORAL_TABLET | Freq: Two times a day (BID) | ORAL | Status: DC

## 2014-03-10 MED ORDER — CARBAMAZEPINE 200 MG PO TABS: 400.0000 mg | ORAL_TABLET | Freq: Three times a day (TID) | ORAL | Status: DC

## 2014-03-10 MED ORDER — HYDROCORTISONE 0.5 % EX CREA: 1.0000 "application " | TOPICAL_CREAM | Freq: Two times a day (BID) | CUTANEOUS | Status: DC

## 2014-03-10 MED ORDER — CIPROFLOXACIN HCL 500 MG PO TABS: 500.0000 mg | ORAL_TABLET | Freq: Two times a day (BID) | ORAL | Status: DC

## 2014-03-10 NOTE — Progress Notes (Signed)
Patient here for follow up from the hospital Was admitted for his seizure disorder and UTI Would like to be re checked for the UTI

## 2014-03-10 NOTE — Patient Instructions (Signed)
Smoking Cessation Quitting smoking is important to your health and has many advantages. However, it is not always easy to quit since nicotine is a very addictive drug. Oftentimes, people try 3 times or more before being able to quit. This document explains the best ways for you to prepare to quit smoking. Quitting takes hard work and a lot of effort, but you can do it. ADVANTAGES OF QUITTING SMOKING  You will live longer, feel better, and live better.  Your body will feel the impact of quitting smoking almost immediately.  Within 20 minutes, blood pressure decreases. Your pulse returns to its normal level.  After 8 hours, carbon monoxide levels in the blood return to normal. Your oxygen level increases.  After 24 hours, the chance of having a heart attack starts to decrease. Your breath, hair, and body stop smelling like smoke.  After 48 hours, damaged nerve endings begin to recover. Your sense of taste and smell improve.  After 72 hours, the body is virtually free of nicotine. Your bronchial tubes relax and breathing becomes easier.  After 2 to 12 weeks, lungs can hold more air. Exercise becomes easier and circulation improves.  The risk of having a heart attack, stroke, cancer, or lung disease is greatly reduced.  After 1 year, the risk of coronary heart disease is cut in half.  After 5 years, the risk of stroke falls to the same as a nonsmoker.  After 10 years, the risk of lung cancer is cut in half and the risk of other cancers decreases significantly.  After 15 years, the risk of coronary heart disease drops, usually to the level of a nonsmoker.  If you are pregnant, quitting smoking will improve your chances of having a healthy baby.  The people you live with, especially any children, will be healthier.  You will have extra money to spend on things other than cigarettes. QUESTIONS TO THINK ABOUT BEFORE ATTEMPTING TO QUIT You may want to talk about your answers with your  health care provider.  Why do you want to quit?  If you tried to quit in the past, what helped and what did not?  What will be the most difficult situations for you after you quit? How will you plan to handle them?  Who can help you through the tough times? Your family? Friends? A health care provider?  What pleasures do you get from smoking? What ways can you still get pleasure if you quit? Here are some questions to ask your health care provider:  How can you help me to be successful at quitting?  What medicine do you think would be best for me and how should I take it?  What should I do if I need more help?  What is smoking withdrawal like? How can I get information on withdrawal? GET READY  Set a quit date.  Change your environment by getting rid of all cigarettes, ashtrays, matches, and lighters in your home, car, or work. Do not let people smoke in your home.  Review your past attempts to quit. Think about what worked and what did not. GET SUPPORT AND ENCOURAGEMENT You have a better chance of being successful if you have help. You can get support in many ways.  Tell your family, friends, and coworkers that you are going to quit and need their support. Ask them not to smoke around you.  Get individual, group, or telephone counseling and support. Programs are available at local hospitals and health centers. Call   your local health department for information about programs in your area.  Spiritual beliefs and practices may help some smokers quit.  Download a "quit meter" on your computer to keep track of quit statistics, such as how long you have gone without smoking, cigarettes not smoked, and money saved.  Get a self-help book about quitting smoking and staying off tobacco. LEARN NEW SKILLS AND BEHAVIORS  Distract yourself from urges to smoke. Talk to someone, go for a walk, or occupy your time with a task.  Change your normal routine. Take a different route to work.  Drink tea instead of coffee. Eat breakfast in a different place.  Reduce your stress. Take a hot bath, exercise, or read a book.  Plan something enjoyable to do every day. Reward yourself for not smoking.  Explore interactive web-based programs that specialize in helping you quit. GET MEDICINE AND USE IT CORRECTLY Medicines can help you stop smoking and decrease the urge to smoke. Combining medicine with the above behavioral methods and support can greatly increase your chances of successfully quitting smoking.  Nicotine replacement therapy helps deliver nicotine to your body without the negative effects and risks of smoking. Nicotine replacement therapy includes nicotine gum, lozenges, inhalers, nasal sprays, and skin patches. Some may be available over-the-counter and others require a prescription.  Antidepressant medicine helps people abstain from smoking, but how this works is unknown. This medicine is available by prescription.  Nicotinic receptor partial agonist medicine simulates the effect of nicotine in your brain. This medicine is available by prescription. Ask your health care provider for advice about which medicines to use and how to use them based on your health history. Your health care provider will tell you what side effects to look out for if you choose to be on a medicine or therapy. Carefully read the information on the package. Do not use any other product containing nicotine while using a nicotine replacement product.  RELAPSE OR DIFFICULT SITUATIONS Most relapses occur within the first 3 months after quitting. Do not be discouraged if you start smoking again. Remember, most people try several times before finally quitting. You may have symptoms of withdrawal because your body is used to nicotine. You may crave cigarettes, be irritable, feel very hungry, cough often, get headaches, or have difficulty concentrating. The withdrawal symptoms are only temporary. They are strongest  when you first quit, but they will go away within 10-14 days. To reduce the chances of relapse, try to:  Avoid drinking alcohol. Drinking lowers your chances of successfully quitting.  Reduce the amount of caffeine you consume. Once you quit smoking, the amount of caffeine in your body increases and can give you symptoms, such as a rapid heartbeat, sweating, and anxiety.  Avoid smokers because they can make you want to smoke.  Do not let weight gain distract you. Many smokers will gain weight when they quit, usually less than 10 pounds. Eat a healthy diet and stay active. You can always lose the weight gained after you quit.  Find ways to improve your mood other than smoking. FOR MORE INFORMATION  www.smokefree.gov  Document Released: 03/01/2001 Document Revised: 07/22/2013 Document Reviewed: 06/16/2011 ExitCare Patient Information 2015 [employer]. This information is not intended to replace advice given to you by your health care provider. Make sure you discuss any questions you have with your health care provider. DASH Eating Plan DASH stands for "Dietary Approaches to Stop Hypertension." The DASH eating plan is a healthy eating plan that has   been shown to reduce high blood pressure (hypertension). Additional health benefits may include reducing the risk of type 2 diabetes mellitus, heart disease, and stroke. The DASH eating plan may also help with weight loss. WHAT DO I NEED TO KNOW ABOUT THE DASH EATING PLAN? For the DASH eating plan, you will follow these general guidelines:  Choose foods with a percent daily value for sodium of less than 5% (as listed on the food label).  Use salt-free seasonings or herbs instead of table salt or sea salt.  Check with your health care provider or pharmacist before using salt substitutes.  Eat lower-sodium products, often labeled as "lower sodium" or "no salt added."  Eat fresh foods.  Eat more vegetables, fruits, and low-fat dairy  products.  Choose whole grains. Look for the word "whole" as the first word in the ingredient list.  Choose fish and skinless chicken or turkey more often than red meat. Limit fish, poultry, and meat to 6 oz (170 g) each day.  Limit sweets, desserts, sugars, and sugary drinks.  Choose heart-healthy fats.  Limit cheese to 1 oz (28 g) per day.  Eat more home-cooked food and less restaurant, buffet, and fast food.  Limit fried foods.  Cook foods using methods other than frying.  Limit canned vegetables. If you do use them, rinse them well to decrease the sodium.  When eating at a restaurant, ask that your food be prepared with less salt, or no salt if possible. WHAT FOODS CAN I EAT? Seek help from a dietitian for individual calorie needs. Grains Whole grain or whole wheat bread. Brown rice. Whole grain or whole wheat pasta. Quinoa, bulgur, and whole grain cereals. Low-sodium cereals. Corn or whole wheat flour tortillas. Whole grain cornbread. Whole grain crackers. Low-sodium crackers. Vegetables Fresh or frozen vegetables (raw, steamed, roasted, or grilled). Low-sodium or reduced-sodium tomato and vegetable juices. Low-sodium or reduced-sodium tomato sauce and paste. Low-sodium or reduced-sodium canned vegetables.  Fruits All fresh, canned (in natural juice), or frozen fruits. Meat and Other Protein Products Ground beef (85% or leaner), grass-fed beef, or beef trimmed of fat. Skinless chicken or turkey. Ground chicken or turkey. Pork trimmed of fat. All fish and seafood. Eggs. Dried beans, peas, or lentils. Unsalted nuts and seeds. Unsalted canned beans. Dairy Low-fat dairy products, such as skim or 1% milk, 2% or reduced-fat cheeses, low-fat ricotta or cottage cheese, or plain low-fat yogurt. Low-sodium or reduced-sodium cheeses. Fats and Oils Tub margarines without trans fats. Light or reduced-fat mayonnaise and salad dressings (reduced sodium). Avocado. Safflower, olive, or canola  oils. Natural peanut or almond butter. Other Unsalted popcorn and pretzels. The items listed above may not be a complete list of recommended foods or beverages. Contact your dietitian for more options. WHAT FOODS ARE NOT RECOMMENDED? Grains White bread. White pasta. White rice. Refined cornbread. Bagels and croissants. Crackers that contain trans fat. Vegetables Creamed or fried vegetables. Vegetables in a cheese sauce. Regular canned vegetables. Regular canned tomato sauce and paste. Regular tomato and vegetable juices. Fruits Dried fruits. Canned fruit in light or heavy syrup. Fruit juice. Meat and Other Protein Products Fatty cuts of meat. Ribs, chicken wings, bacon, sausage, bologna, salami, chitterlings, fatback, hot dogs, bratwurst, and packaged luncheon meats. Salted nuts and seeds. Canned beans with salt. Dairy Whole or 2% milk, cream, half-and-half, and cream cheese. Whole-fat or sweetened yogurt. Full-fat cheeses or blue cheese. Nondairy creamers and whipped toppings. Processed cheese, cheese spreads, or cheese curds. Condiments Onion and garlic salt,   seasoned salt, table salt, and sea salt. Canned and packaged gravies. Worcestershire sauce. Tartar sauce. Barbecue sauce. Teriyaki sauce. Soy sauce, including reduced sodium. Steak sauce. Fish sauce. Oyster sauce. Cocktail sauce. Horseradish. Ketchup and mustard. Meat flavorings and tenderizers. Bouillon cubes. Hot sauce. Tabasco sauce. Marinades. Taco seasonings. Relishes. Fats and Oils Butter, stick margarine, lard, shortening, ghee, and bacon fat. Coconut, palm kernel, or palm oils. Regular salad dressings. Other Pickles and olives. Salted popcorn and pretzels. The items listed above may not be a complete list of foods and beverages to avoid. Contact your dietitian for more information. WHERE CAN I FIND MORE INFORMATION? National Heart, Lung, and Blood Institute: www.nhlbi.nih.gov/health/health-topics/topics/dash/ Document Released:  02/24/2011 Document Revised: 07/22/2013 Document Reviewed: 01/09/2013 ExitCare Patient Information 2015 [employer]. This information is not intended to replace advice given to you by your health care provider. Make sure you discuss any questions you have with your health care provider.  

## 2014-03-10 NOTE — Progress Notes (Signed)
MRN: [id number] Name: [name] [name]  Sex: male Age: 53 y.o. DOB: [birth year]  Allergies: Review of patient's allergies indicates no known allergies.  Chief Complaint  Patient presents with  . Follow-up    HPI: Patient is 53 y.o. male who Patient has history of cerebral aneurysm status post repair 2007 [place], seizure disorder hypertension history of alcohol use, last month patient was hospitalized with symptoms of urinary incontinence and lower extremity weakness after suspected seizure, EMR reviewed patient had MRI of the brain and spine are done which were inconclusive, neurology on board patient was restarted her on Keppra, as well as continued with Tegretol,, patient also history of hepatitis C and never been treated, and was advised to be referred to specialist, patient also had urine infection with Escherichia coli most treated with ciprofloxacin, currently he also has symptoms of urinary frequency denies any dysuria, UA shows leukocyte esterase positive, denies fever chills nausea vomiting.patient is requesting refill on his medications.  Past Medical History  Diagnosis Date  . Seizure   . ETOHism   . Aneurysm   . Hypertension     Past Surgical History  Procedure Laterality Date  . Cerebral aneurysm repair      [facility]  . I&d extremity Left 10/26/2012    Procedure: IRRIGATION AND DEBRIDEMENT Left Elbow;  Surgeon: Sheral Apleyimothy D Murphy, MD;  [facility];  Service: Orthopedics;  Laterality: Left;  . I&d extremity Left 10/29/2012    Procedure: IRRIGATION AND DEBRIDEMENT EXTREMITY, wound vac change, stimulan beads;  Surgeon: Sheral Apleyimothy D Murphy, MD;  Location: [facility] OR;  Service: Orthopedics;  Laterality: Left;  lateral on bean bag  . Hardware removal Left 10/29/2012    Procedure: HARDWARE REMOVAL;  Surgeon: Sheral Apleyimothy D Murphy, MD;  [facility];  Service: Orthopedics;  Laterality: Left;  . Incision and drainage abscess Left 11/02/2012    Procedure: INCISION AND  DRAINAGE ABSCESS;  Surgeon: Sheral Apleyimothy D Murphy, MD;  Location: [facility] OR;  Service: Orthopedics;  Laterality: Left;  . I&d extremity Left 11/02/2012    Procedure: IRRIGATION AND DEBRIDEMENT EXTREMITY;  Surgeon: Sheral Apleyimothy D Murphy, MD;  Location: [facility] OR;  Service: Orthopedics;  Laterality: Left;      Medication List       This list is accurate as of: 03/10/14  3:02 PM.  Always use your most recent med list.               carbamazepine 200 MG tablet  Commonly known as:  TEGRETOL  Take 2 tablets (400 mg total) by mouth 3 (three) times daily.     ciprofloxacin 500 MG tablet  Commonly known as:  CIPRO  Take 1 tablet (500 mg total) by mouth 2 (two) times daily.     folic acid 1 MG tablet  Commonly known as:  FOLVITE  Take 1 tablet (1 mg total) by mouth daily.     levETIRAcetam 1000 MG tablet  Commonly known as:  KEPPRA  Take 1 tablet (1,000 mg total) by mouth 2 (two) times daily.     metoprolol 50 MG tablet  Commonly known as:  LOPRESSOR  Take 0.5 tablets (25 mg total) by mouth 2 (two) times daily.     thiamine 100 MG tablet  Take 1 tablet (100 mg total) by mouth daily.     traMADol 50 MG tablet  Commonly known as:  ULTRAM  Take 1 tablet (50 mg total) by mouth every 6 (six) hours as needed.  Meds ordered this encounter  Medications  . carbamazepine (TEGRETOL) 200 MG tablet    Sig: Take 2 tablets (400 mg total) by mouth 3 (three) times daily.    Dispense:  180 tablet    Refill:  3  . levETIRAcetam (KEPPRA) 1000 MG tablet    Sig: Take 1 tablet (1,000 mg total) by mouth 2 (two) times daily.    Dispense:  60 tablet    Refill:  3  . metoprolol (LOPRESSOR) 50 MG tablet    Sig: Take 0.5 tablets (25 mg total) by mouth 2 (two) times daily.    Dispense:  60 tablet    Refill:  3  . ciprofloxacin (CIPRO) 500 MG tablet    Sig: Take 1 tablet (500 mg total) by mouth 2 (two) times daily.    Dispense:  10 tablet    Refill:  0     There is no immunization history on file for  this patient.  Family History  Problem Relation Age of Onset  . Hypertension Mother   . Hypertension Father   . Diabetes Sister     History  Substance Use Topics  . Smoking status: Current Some Day Smoker    Types: Cigarettes  . Smokeless tobacco: Never Used  . Alcohol Use: No     Comment: quit drinking 11/2011 per patient    Review of Systems   As noted in HPI  Filed Vitals:   03/10/14 1420  BP: 133/84  Pulse: 91  Temp: 98 F (36.7 C)  Resp: 16    Physical Exam  Physical Exam  HENT:  No tongue bite marks   Eyes: EOM are normal. Pupils are equal, round, and reactive to light.  Neck: Neck supple.  Cardiovascular: Normal rate and regular rhythm.   Pulmonary/Chest: Breath sounds normal. No respiratory distress. He has no wheezes. He has no rales.  Abdominal: There is no rebound and no guarding.  Minimal suprapubic tenderness , no CVA tenderness   Musculoskeletal: He exhibits no edema.    CBC    Component Value Date/Time   WBC 9.9 01/24/2014 0500   RBC 3.55* 01/24/2014 0500   HGB 12.2* 01/24/2014 0500   HCT 34.6* 01/24/2014 0500   PLT 142* 01/24/2014 0500   MCV 97.5 01/24/2014 0500   LYMPHSABS 3.3 01/24/2014 0500   MONOABS 0.9 01/24/2014 0500   EOSABS 0.1 01/24/2014 0500   BASOSABS 0.0 01/24/2014 0500    CMP     Component Value Date/Time   NA 133* 01/24/2014 1325   K 3.9 01/24/2014 1325   CL 94* 01/24/2014 1325   CO2 25 01/24/2014 1325   GLUCOSE 109* 01/24/2014 1325   BUN 6 01/24/2014 1325   CREATININE 0.63 01/24/2014 1325   CALCIUM 8.7 01/24/2014 1325   PROT 7.5 01/24/2014 1325   ALBUMIN 3.0* 01/24/2014 1325   AST 19 01/24/2014 1325   ALT 15 01/24/2014 1325   ALKPHOS 89 01/24/2014 1325   BILITOT 0.3 01/24/2014 1325   GFRNONAA >90 01/24/2014 1325   GFRAA >90 01/24/2014 1325    No results found for: CHOL  No components found for: HGA1C  Lab Results  Component Value Date/Time   AST 19 01/24/2014 01:25 PM    Assessment and  Plan  History of UTI - Plan:  Results for orders placed or performed in visit on 03/10/14  Urinalysis Dipstick  Result Value Ref Range   Color, UA yellow    Clarity, UA clear    Glucose, UA neg  Bilirubin, UA small    Ketones, UA neg    Spec Grav, UA 1.025    Blood, UA neg    pH, UA 5.5    Protein, UA 30    Urobilinogen, UA 0.2    Nitrite, UA neg    Leukocytes, UA Trace    Urinalysis Dipstick still shows leukocyte esterase, patient has urinary frequency, will send, Urine culture, also given Cipro 500 mg twice a day to take for 5 days.  Chronic hepatitis C without hepatic coma - Plan: Ambulatory referral to Infectious Disease  Convulsions, unspecified convulsion type - Plan: patient is given refill on his medications and also referred to neurology, carbamazepine (TEGRETOL) 200 MG tablet, levETIRAcetam (KEPPRA) 1000 MG tablet, Ambulatory referral to Neurology  Essential hypertension, benign - Plan:advised patient for DASH diet, patient is given refill on the medication metoprolol (LOPRESSOR) 50 MG tablet, COMPLETE METABOLIC PANEL WITH GFR  Smoking Advised patient to quit smoking.  Blurry vision - Plan: Ambulatory referral to Ophthalmology   Health Maintenance : -Vaccinations:  Patient declines flu shot   Return in about 3 months (around 06/09/2014).  [name], MD

## 2014-03-12 ENCOUNTER — Telehealth: Payer: Self-pay | Admitting: *Deleted

## 2014-03-12 LAB — URINE CULTURE
Colony Count: NO GROWTH
Organism ID, Bacteria: NO GROWTH

## 2014-03-12 NOTE — Telephone Encounter (Signed)
Pt aware of lab results 

## 2014-03-12 NOTE — Telephone Encounter (Signed)
-----   Message from Deepak Advani, MD sent at 03/11/2014  2:15 PM EST ----- Call and let the Patient know that blood work is normal.  

## 2014-03-12 NOTE — Telephone Encounter (Signed)
-----   Message from Doris Cheadleeepak Advani, MD sent at 03/11/2014  2:15 PM EST ----- Call and let the Patient know that blood work is normal.

## 2014-03-18 ENCOUNTER — Other Ambulatory Visit (INDEPENDENT_AMBULATORY_CARE_PROVIDER_SITE_OTHER): Payer: Medicaid Other

## 2014-03-18 DIAGNOSIS — B182 Chronic viral hepatitis C: Secondary | ICD-10-CM

## 2014-03-18 LAB — IRON: Iron: 131 ug/dL (ref 42–165)

## 2014-03-19 ENCOUNTER — Telehealth: Payer: Self-pay | Admitting: Internal Medicine

## 2014-03-19 LAB — PROTIME-INR
INR: 1.06 (ref <1.50)
Prothrombin Time: 13.8 seconds (ref 11.6–15.2)

## 2014-03-19 LAB — HEPATITIS B SURFACE ANTIGEN: Hepatitis B Surface Ag: NEGATIVE

## 2014-03-19 LAB — ANA: Anti Nuclear Antibody(ANA): NEGATIVE

## 2014-03-19 LAB — HEPATITIS B SURFACE ANTIBODY,QUALITATIVE: HEP B S AB: NEGATIVE

## 2014-03-19 LAB — HEPATITIS B CORE ANTIBODY, TOTAL: Hep B Core Total Ab: REACTIVE — AB

## 2014-03-19 LAB — HEPATITIS A ANTIBODY, TOTAL: HEP A TOTAL AB: REACTIVE — AB

## 2014-03-19 NOTE — Telephone Encounter (Signed)
Cordelia PenSherry nurse case manager called to request a med refill for folic acid (FOLVITE) 1 MG tablet on behalf of the patient. Nurse states that patient would like [facility] [address]Please f/u with nurse if necessary.

## 2014-03-22 LAB — HEPATITIS C RNA QUANTITATIVE
HCV QUANT: 4696324 [IU]/mL — AB (ref <15)
HCV Quantitative Log: 6.67 {Log} — ABNORMAL HIGH (ref <1.18)

## 2014-03-25 ENCOUNTER — Telehealth: Payer: Self-pay | Admitting: Emergency Medicine

## 2014-03-25 ENCOUNTER — Other Ambulatory Visit: Payer: Self-pay | Admitting: Emergency Medicine

## 2014-03-25 ENCOUNTER — Telehealth: Payer: Self-pay | Admitting: Internal Medicine

## 2014-03-25 MED ORDER — THIAMINE HCL 100 MG PO TABS: 100.0000 mg | ORAL_TABLET | Freq: Every day | ORAL | Status: DC

## 2014-03-25 MED ORDER — FOLIC ACID 1 MG PO TABS: 1.0000 mg | ORAL_TABLET | Freq: Every day | ORAL | Status: DC

## 2014-03-25 NOTE — Telephone Encounter (Signed)
Medications Thiamine and Folic Acid e-scribed to [facility] per pt request

## 2014-03-25 NOTE — Telephone Encounter (Signed)
Patient's nurse calling to request a medication refill for thiamine 100 MG tablet. Informed nurse that since this medication was not prescribed by a physician here, then he may need to first be seen by doctor to determine whether or not the medication can or will be refilled. Nurse understood and will call back to schedule, if needed. Please assist with refill request with possible.

## 2014-03-26 LAB — HEPATITIS C GENOTYPE

## 2014-04-08 ENCOUNTER — Ambulatory Visit (INDEPENDENT_AMBULATORY_CARE_PROVIDER_SITE_OTHER): Payer: Medicaid Other | Admitting: Neurology

## 2014-04-08 ENCOUNTER — Encounter: Payer: Self-pay | Admitting: Neurology

## 2014-04-08 VITALS — BP 133/82 | HR 83 | Ht 78.0 in | Wt 206.8 lb

## 2014-04-08 DIAGNOSIS — Z5181 Encounter for therapeutic drug level monitoring: Secondary | ICD-10-CM

## 2014-04-08 DIAGNOSIS — R569 Unspecified convulsions: Secondary | ICD-10-CM

## 2014-04-08 NOTE — Progress Notes (Signed)
Reason for visit: Seizures  [name] [name] is a 54 y.o. male  History of present illness:  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 The patient indicates that he has always been under poor control with the seizures, and he will have seizures almost every other day. He indicates that he may get dizzy prior to the seizure, and then he may have a generalized tonic-clonic event associated with some tongue biting and occasional urinary incontinence. The patient last had a seizure yesterday. He indicates that he has been on carbamazepine and Keppra, but he continues to have seizure events. CT scan of the brain was done recently in the hospital in November 2015 showing bifrontal encephalomalacia. The patient has a history of hepatitis C and he reports numbness in the feet, and a sensation of his feet being cold. The patient has some balance issues as well. He has been sent to this office for further evaluation of the seizure episodes.   Past Medical History  Diagnosis Date  . Seizure   . ETOHism   . Aneurysm   . Hypertension     Past Surgical History  Procedure Laterality Date  . Cerebral aneurysm repair      At [facility]  . I&d extremity Left 10/26/2012    Procedure: IRRIGATION AND DEBRIDEMENT Left Elbow;  Surgeon: Sheral Apleyimothy D Murphy, MD;  [facility];  Service: Orthopedics;  Laterality: Left;  . I&d extremity Left 10/29/2012    Procedure: IRRIGATION AND DEBRIDEMENT EXTREMITY, wound vac change, stimulan beads;  Surgeon: Sheral Apleyimothy D Murphy, MD;  Location: [facility] OR;  Service: Orthopedics;  Laterality: Left;  lateral on bean bag  . Hardware removal Left 10/29/2012    Procedure: HARDWARE REMOVAL;  Surgeon: Sheral Apleyimothy D Murphy, MD;  [facility];  Service: Orthopedics;  Laterality: Left;  . Incision and drainage abscess Left 11/02/2012    Procedure: INCISION AND DRAINAGE ABSCESS;   Surgeon: Sheral Apleyimothy D Murphy, MD;  Location: [facility] OR;  Service: Orthopedics;  Laterality: Left;  . I&d extremity Left 11/02/2012    Procedure: IRRIGATION AND DEBRIDEMENT EXTREMITY;  Surgeon: Sheral Apleyimothy D Murphy, MD;  Location: [facility] OR;  Service: Orthopedics;  Laterality: Left;    Family History  Problem Relation Age of Onset  . Hypertension Mother   . Cancer Mother   . Hypertension Father   . Diabetes Sister   . Cancer Sister   . Cancer Sister     Social history:  reports that he has been smoking Cigarettes.  He has never used smokeless tobacco. He reports that he does not drink alcohol or use illicit drugs.  Medications:  Current Outpatient Prescriptions on File Prior to Visit  Medication Sig Dispense Refill  . carbamazepine (TEGRETOL) 200 MG tablet Take 2 tablets (400 mg total) by mouth 3 (three) times daily. 180 tablet 3  . folic acid (FOLVITE) 1 MG tablet Take 1 tablet (1 mg total) by mouth daily. 30 tablet 2  . levETIRAcetam (KEPPRA) 1000 MG tablet Take 1 tablet (1,000 mg total) by mouth 2 (two) times daily. 60 tablet 3  . metoprolol (LOPRESSOR) 50 MG tablet Take 0.5 tablets (25 mg total) by mouth 2 (two) times daily. 60 tablet 3  . thiamine 100 MG tablet Take 1 tablet (100 mg total) by mouth daily. 30 tablet 2  . hydrocortisone cream 0.5 % Apply 1 application topically 2 (two) times daily. Use to  the affected area BID (Patient not taking: Reported on 04/08/2014) 30 g 0   No current facility-administered medications on file prior to visit.     No Known Allergies  ROS:  Out of a complete 14 system review of symptoms, the patient complains only of the following symptoms, and all other reviewed systems are negative.  Fevers, weight loss, fatigue Chest pain Hearing loss, ringing in the ears Moles Blurred vision, eye pain Shortness of breath, wheezing Feeling hot, cold, increased thirst Joint pain, joint swelling, aching muscles Runny nose Memory loss, confusion, headache, numbness,  slurred speech, dizziness, seizures, passing out Depression, not enough sleep, decreased energy, change in appetite, racing thoughts Insomnia  Blood pressure 133/82, pulse 83, height  (1.981 m), weight 206 lb 12.8 oz (93.804 kg).  Physical Exam  General: The patient is alert and cooperative at the time of the examination.  Eyes: Pupils are equal, round, and reactive to light. Discs are flat bilaterally.  Neck: The neck is supple, no carotid bruits are noted.  Respiratory: The respiratory examination is clear.  Cardiovascular: The cardiovascular examination reveals a regular rate and rhythm, no obvious murmurs or rubs are noted.  Skin: Extremities are without significant edema.  Neurologic Exam  Mental status: The patient is alert and oriented x 3 at the time of the examination. The patient has apparent normal recent and remote memory, with an apparently normal attention span and concentration ability.  Cranial nerves: Facial symmetry is present. There is good sensation of the face to pinprick and soft touch on the right, decreased on the left. The strength of the facial muscles and the muscles to head turning and shoulder shrug are normal bilaterally. Speech is well enunciated, no aphasia or dysarthria is noted. Extraocular movements are full. Visual fields are full. The tongue is midline, and the patient has symmetric elevation of the soft palate. No obvious hearing deficits are noted.  Motor: The motor testing reveals 5 over 5 strength of all 4 extremities. Good symmetric motor tone is noted throughout.  Sensory: Sensory testing reveals decreased pinprick, soft touch, vibration sensation, and position sense on the left arm and leg relative to the right. The patient is a stocking pattern pinprick sensory deficit two thirds way up the legs below the knees. No evidence of extinction is noted.  Coordination: Cerebellar testing reveals good finger-nose-finger and heel-to-shin  bilaterally.  Gait and station: Gait is wide-based. Tandem gait is unsteady. Romberg is negative. No drift is seen.  Reflexes: Deep tendon reflexes are symmetric and normal bilaterally, with exception that the ankle jerk reflexes are depressed bilaterally. Toes are downgoing bilaterally.   CT head 01/23/14:  IMPRESSION: No acute intracranial abnormalities. Chronic atrophy. 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 No change since previous study.  * The CT images were reviewed online.   Assessment/Plan:  1. History of cerebral aneurysm, coiling procedure  2. Seizures  3. Bifrontal encephalomalacia  4. Probable peripheral neuropathy  5. Gait disorder  6. History of hepatitis C  The patient has reported a history of very poorly controlled seizures, occurring once every other day or so. He is on carbamazepine and Keppra. The Keppra dose may need to be maximized. The patient will be sent for blood work today. He likely has a peripheral neuropathy that may be associated with the hepatitis C. The patient will follow-up in 3-4 months. We may add Lyrica in the future for the seizure episodes and for the peripheral neuropathy pain. The patient will  undergo an EEG study. We may consider EMG and nerve conduction study evaluation in the future.  Marlan Palau MD 04/08/2014 7:04 PM  [facility] [address] [place], [place] [address]  Phone [phone] Fax [phone]

## 2014-04-08 NOTE — Patient Instructions (Signed)

## 2014-04-09 LAB — CBC WITH DIFFERENTIAL
BASOS: 0 %
Basophils Absolute: 0 10*3/uL (ref 0.0–0.2)
EOS: 0 %
Eosinophils Absolute: 0 10*3/uL (ref 0.0–0.4)
HCT: 45.2 % (ref 37.5–51.0)
Hemoglobin: 15.8 g/dL (ref 12.6–17.7)
Immature Grans (Abs): 0 10*3/uL (ref 0.0–0.1)
Immature Granulocytes: 0 %
LYMPHS ABS: 2.5 10*3/uL (ref 0.7–3.1)
Lymphs: 40 %
MCH: 35.1 pg — AB (ref 26.6–33.0)
MCHC: 35 g/dL (ref 31.5–35.7)
MCV: 100 fL — AB (ref 79–97)
MONOS ABS: 0.6 10*3/uL (ref 0.1–0.9)
Monocytes: 10 %
Neutrophils Absolute: 3 10*3/uL (ref 1.4–7.0)
Neutrophils Relative %: 50 %
Platelets: 174 10*3/uL (ref 150–379)
RBC: 4.5 x10E6/uL (ref 4.14–5.80)
RDW: 12.7 % (ref 12.3–15.4)
WBC: 6.2 10*3/uL (ref 3.4–10.8)

## 2014-04-10 ENCOUNTER — Other Ambulatory Visit: Payer: Medicaid Other | Admitting: Radiology

## 2014-04-10 ENCOUNTER — Telehealth: Payer: Self-pay | Admitting: Radiology

## 2014-04-10 NOTE — Telephone Encounter (Signed)
No show for EEG on 04/10/2014.

## 2014-04-14 ENCOUNTER — Telehealth: Payer: Self-pay | Admitting: Neurology

## 2014-04-14 LAB — IFE AND PE, SERUM
ALBUMIN SERPL ELPH-MCNC: 4.2 g/dL (ref 3.2–5.6)
ALBUMIN/GLOB SERPL: 1.2 (ref 0.7–2.0)
ALPHA2 GLOB SERPL ELPH-MCNC: 0.9 g/dL (ref 0.4–1.2)
Alpha 1: 0.2 g/dL (ref 0.1–0.4)
B-Globulin SerPl Elph-Mcnc: 0.9 g/dL (ref 0.6–1.3)
Gamma Glob SerPl Elph-Mcnc: 1.7 g/dL — ABNORMAL HIGH (ref 0.5–1.6)
Globulin, Total: 3.7 g/dL (ref 2.0–4.5)
IGG (IMMUNOGLOBIN G), SERUM: 1669 mg/dL — AB (ref 700–1600)
IgA/Immunoglobulin A, Serum: 353 mg/dL (ref 91–414)
IgM (Immunoglobulin M), Srm: 127 mg/dL (ref 40–230)

## 2014-04-14 LAB — CBC WITH DIFFERENTIAL/PLATELET
BASOS ABS: 0 10*3/uL (ref 0.0–0.2)
Basos: 0 %
EOS ABS: 0 10*3/uL (ref 0.0–0.4)
Eos: 0 %
HEMATOCRIT: 45.2 % (ref 37.5–51.0)
HEMOGLOBIN: 15.8 g/dL (ref 12.6–17.7)
IMMATURE GRANULOCYTES: 0 %
Immature Grans (Abs): 0 10*3/uL (ref 0.0–0.1)
LYMPHS ABS: 2.5 10*3/uL (ref 0.7–3.1)
Lymphs: 40 %
MCH: 35.1 pg — AB (ref 26.6–33.0)
MCHC: 35 g/dL (ref 31.5–35.7)
MCV: 100 fL — AB (ref 79–97)
Monocytes Absolute: 0.6 10*3/uL (ref 0.1–0.9)
Monocytes: 10 %
NEUTROS ABS: 3 10*3/uL (ref 1.4–7.0)
Neutrophils Relative %: 50 %
Platelets: 174 10*3/uL (ref 150–379)
RBC: 4.5 x10E6/uL (ref 4.14–5.80)
RDW: 12.7 % (ref 12.3–15.4)
WBC: 6.2 10*3/uL (ref 3.4–10.8)

## 2014-04-14 LAB — COMPREHENSIVE METABOLIC PANEL
A/G RATIO: 1.5 (ref 1.1–2.5)
ALBUMIN: 4.7 g/dL (ref 3.5–5.5)
ALT: 51 IU/L — AB (ref 0–44)
AST: 53 IU/L — ABNORMAL HIGH (ref 0–40)
Alkaline Phosphatase: 108 IU/L (ref 39–117)
BILIRUBIN TOTAL: 0.5 mg/dL (ref 0.0–1.2)
BUN/Creatinine Ratio: 8 — ABNORMAL LOW (ref 9–20)
BUN: 6 mg/dL (ref 6–24)
CO2: 22 mmol/L (ref 18–29)
Calcium: 9.2 mg/dL (ref 8.7–10.2)
Chloride: 92 mmol/L — ABNORMAL LOW (ref 97–108)
Creatinine, Ser: 0.73 mg/dL — ABNORMAL LOW (ref 0.76–1.27)
GFR calc Af Amer: 121 mL/min/{1.73_m2} (ref >59)
GFR, EST NON AFRICAN AMERICAN: 104 mL/min/{1.73_m2} (ref >59)
GLUCOSE: 101 mg/dL — AB (ref 65–99)
Globulin, Total: 3.2 g/dL (ref 1.5–4.5)
Potassium: 4.6 mmol/L (ref 3.5–5.2)
Sodium: 131 mmol/L — ABNORMAL LOW (ref 134–144)
TOTAL PROTEIN: 7.9 g/dL (ref 6.0–8.5)

## 2014-04-14 LAB — CARBAMAZEPINE LEVEL, TOTAL: Carbamazepine Lvl: 7.7 ug/mL (ref 4.0–12.0)

## 2014-04-14 LAB — SEDIMENTATION RATE: Sed Rate: 13 mm/hr (ref 0–30)

## 2014-04-14 LAB — ANGIOTENSIN CONVERTING ENZYME: ANGIO CONVERT ENZYME: 42 U/L (ref 14–82)

## 2014-04-14 LAB — CRYOGLOBULIN, QL, SERUM, RFLX

## 2014-04-14 NOTE — Telephone Encounter (Signed)
I called patient. The blood work shows a sodium level of 131, this may be related to the use of carbamazepine. He is to restrict fluid intake to help prevent the sodium level from going lower. Liver enzymes are slightly elevated, this will need to be followed over time. Otherwise, the blood work was unremarkable. No etiology of a peripheral neuropathy was seen.

## 2014-04-15 ENCOUNTER — Telehealth: Payer: Self-pay | Admitting: Neurology

## 2014-04-15 ENCOUNTER — Other Ambulatory Visit: Payer: Self-pay | Admitting: Neurology

## 2014-04-15 ENCOUNTER — Ambulatory Visit (INDEPENDENT_AMBULATORY_CARE_PROVIDER_SITE_OTHER): Payer: Medicaid Other | Admitting: Neurology

## 2014-04-15 ENCOUNTER — Other Ambulatory Visit: Payer: Medicaid Other

## 2014-04-15 DIAGNOSIS — R569 Unspecified convulsions: Secondary | ICD-10-CM

## 2014-04-15 MED ORDER — PREGABALIN 50 MG PO CAPS: ORAL_CAPSULE | ORAL | Status: DC

## 2014-04-15 NOTE — Telephone Encounter (Signed)
All required info has been sent to ins.  Request is under review.  I called the patient back.  Got no answer.  Left message.

## 2014-04-15 NOTE — Telephone Encounter (Signed)
I called the patient. The EEG study was normal. Lyrica was added today, if the seizures continue with high frequency, the patient will be sent for a prolonged EEG study.

## 2014-04-15 NOTE — Procedures (Signed)
    History:  [name] [name] is a 54 year old gentleman with a history of a ruptured cerebral aneurysm and a history of seizures. The patient indicates he has dizziness associated with generalized jerking following the seizure. He indicates that the seizures are almost every other day. He is on carbamazepine and Keppra. He is being evaluated for the seizures.  This is a routine EEG. No skull defects are noted. Medications include carbamazepine, folic acid, Keppra, Lopressor, Lyrica, and thiamine.   EEG classification: Normal awake  Description of the recording: The background rhythms of this recording consists of a fairly well modulated medium amplitude alpha rhythm of 8 Hz that is reactive to eye opening and closure. As the record progresses, the patient appears to remain in the waking state throughout the recording. Photic stimulation was performed, resulting in a bilateral and symmetric photic driving response. Hyperventilation was also performed, resulting in a minimal buildup of the background rhythm activities without significant slowing seen. At no time during the recording does there appear to be evidence of spike or spike wave discharges or evidence of focal slowing. EKG monitor shows no evidence of cardiac rhythm abnormalities with a heart rate of 60.  Impression: This is a normal EEG recording in the waking state. No evidence of ictal or interictal discharges are seen.

## 2014-04-15 NOTE — Telephone Encounter (Signed)
Patient stated [facility] needs prior authorization for Rx pregabalin (LYRICA) 50 MG capsule,  Please call [phone] and advise.

## 2014-04-23 ENCOUNTER — Encounter: Payer: Self-pay | Admitting: Adult Health

## 2014-05-07 ENCOUNTER — Encounter: Payer: Self-pay | Admitting: Internal Medicine

## 2014-05-07 ENCOUNTER — Ambulatory Visit (INDEPENDENT_AMBULATORY_CARE_PROVIDER_SITE_OTHER): Payer: Medicaid Other | Admitting: Internal Medicine

## 2014-05-07 VITALS — BP 163/96 | HR 80 | Temp 98.0°F | Ht 76.0 in | Wt 206.0 lb

## 2014-05-07 DIAGNOSIS — B182 Chronic viral hepatitis C: Secondary | ICD-10-CM

## 2014-05-07 NOTE — Progress Notes (Signed)
+[name] [name] is a 54 y.o. male who presents for initial evaluation and management of a positive Hepatitis C antibody test.  Patient tested positive 15 years ago. 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 Patient denies history of blood transfusion, sexual contact with person with liver disease, tattoos. Patient has had other studies performed. Results: hepatitis C RNA by PCR, result: positive. Patient has not had prior treatment for Hepatitis C. Patient does not have a past history of liver disease. Patient does not have a family history of liver disease.   HPI: He has a history of a brain aneurysm and resultant memory loss and also has what he describes as recurrent seizures. He states his last seizure was Monday, 2 days prior to this exam, and he has them often. This is despite taking Tegretol, Keppra. He also suffers from peripheral neuropathy and was prescribed Lyrica however was unable to pick it up since he does not have the prescription.  Patient does have documented immunity to Hepatitis A. Patient does have documented immunity to Hepatitis B.     Review of Systems A comprehensive review of systems was negative. History though is limited by patient's memory issues   Past Medical History  Diagnosis Date  . Seizure   . ETOHism   . Aneurysm   . Hypertension     Prior to Admission medications   Medication Sig Start Date End Date Taking? Authorizing Provider  carbamazepine (TEGRETOL) 200 MG tablet Take 2 tablets (400 mg total) by mouth 3 (three) times daily. Patient not taking: Reported on 05/07/2014 03/10/14   [name], MD  folic acid (FOLVITE) 1 MG tablet Take 1 tablet (1 mg total) by mouth daily. Patient not taking: Reported on 05/07/2014 03/25/14   [name], MD  hydrocortisone cream 0.5 % Apply 1 application topically 2 (two) times daily. Use to the affected area BID Patient not taking: Reported on 04/08/2014 03/10/14   [name], MD  levETIRAcetam (KEPPRA) 1000 MG tablet Take 1 tablet (1,000 mg total) by mouth 2 (two) times daily. Patient not taking: Reported on 05/07/2014 03/10/14   [name], MD  metoprolol (LOPRESSOR) 50 MG tablet Take 0.5 tablets (25 mg total) by mouth 2 (two) times daily. Patient not taking: Reported on 05/07/2014 03/10/14   [name], MD  pregabalin (LYRICA) 50 MG capsule One capsule twice a day for 2 weeks, then take 2 capsules twice a day Patient not taking: Reported on 05/07/2014 04/15/14   York Spaniel, MD  thiamine 100 MG tablet Take 1 tablet (100 mg total) by mouth daily. Patient not taking: Reported on 05/07/2014 03/25/14   [name], MD    No Known Allergies  History  Substance Use Topics  . Smoking status: Current Some Day Smoker    Types: Cigarettes  . Smokeless tobacco: Never Used  . Alcohol Use: No     Comment: quit drinking 11/2011 per patient    Family History  Problem Relation Age of Onset  . Hypertension Mother   . Cancer Mother   . Hypertension Father   . Diabetes Sister   . Cancer Sister   . Cancer Sister       Objective:   Filed Vitals:   05/07/14 1440  BP: 163/96  Pulse: 80  Temp: 98 F (36.7 C)   in no apparent distress and alert HEENT: anicteric Cor RRR and No murmurs clear Bowel sounds are normal, liver is not enlarged, spleen  is not enlarged peripheral pulses normal, no pedal edema, no clubbing or cyanosis negative for - jaundice, spider hemangioma, telangiectasia, palmar erythema, ecchymosis and atrophy  Laboratory Genotype:  Lab Results  Component Value Date   HCVGENOTYPE 1b 03/18/2014   HCV viral load:  Lab Results  Component Value Date   HCVQUANT [id number]* 03/18/2014   Lab Results  Component Value Date   WBC 6.2 04/08/2014   WBC 6.2 04/08/2014   HGB 15.8 04/08/2014   HGB 15.8 04/08/2014   HCT 45.2 04/08/2014   HCT 45.2 04/08/2014   MCV 100* 04/08/2014   MCV 100* 04/08/2014   PLT 174 04/08/2014   PLT 174  04/08/2014    Lab Results  Component Value Date   CREATININE 0.73* 04/08/2014   BUN 6 04/08/2014   NA 131* 04/08/2014   K 4.6 04/08/2014   CL 92* 04/08/2014   CO2 22 04/08/2014    Lab Results  Component Value Date   ALT 51* 04/08/2014   AST 53* 04/08/2014   ALKPHOS 108 04/08/2014   BILITOT 0.5 04/08/2014   INR 1.06 03/18/2014      Assessment: Chronic Hepatitis C genotype 1b  Plan: 1) Patient counseled extensively on limiting acetaminophen to no more than 2 grams daily, avoidance of alcohol. 2) Transmission discussed with patient including sexual transmission, sharing razors and toothbrush.   3) Will need referral to gastroenterology if concern for cirrhosis 4) Will need referral for substance abuse counseling: No. 5) Will prescribe Harvoni once his medications for seizures are sorted out 6) Hepatitis A vaccine No. 7) Hepatitis B vaccine No. 8) Pneumovax vaccine if concern for cirrhosis 9) will follow up after next neurology appt.  I had a long discussion with the patient regarding the interaction with Tegretol.  Will need to discuss with Dr. Anne HahnWillis and Dr Orpah CobbAdvani if a tegretol free regimen would be possible.   60 minutes spent including 30 minutes of face to face counseling on hep C.

## 2014-05-08 ENCOUNTER — Telehealth: Payer: Self-pay | Admitting: Neurology

## 2014-05-08 NOTE — Telephone Encounter (Signed)
-----   Message from York Spanielharles K [name], MD sent at 05/07/2014  6:03 PM EST ----- This patient will need a revisit sometime in the next several weeks to adjust and change seizure medications.

## 2014-05-08 NOTE — Telephone Encounter (Signed)
Left message for patient to return call for appointment to adjust seizure medications.  I relayed that I have an available time tomorrow at 4:15pm if he is able to make it, otherwise need to be seen in next couple of weeks.

## 2014-05-13 NOTE — Telephone Encounter (Signed)
Spoke to patient and he is scheduled for 05-15-14 at 1430.

## 2014-05-15 ENCOUNTER — Ambulatory Visit (INDEPENDENT_AMBULATORY_CARE_PROVIDER_SITE_OTHER): Payer: Medicaid Other | Admitting: Neurology

## 2014-05-15 ENCOUNTER — Encounter: Payer: Self-pay | Admitting: Neurology

## 2014-05-15 VITALS — BP 148/86 | HR 92

## 2014-05-15 DIAGNOSIS — R569 Unspecified convulsions: Secondary | ICD-10-CM

## 2014-05-15 DIAGNOSIS — R29898 Other symptoms and signs involving the musculoskeletal system: Secondary | ICD-10-CM

## 2014-05-15 MED ORDER — PREGABALIN 50 MG PO CAPS: ORAL_CAPSULE | ORAL | Status: DC

## 2014-05-15 NOTE — Patient Instructions (Signed)

## 2014-05-15 NOTE — Progress Notes (Signed)
Reason for visit: Seizures  [name] [name] is an 54 y.o. male  History of present illness:  [name] [name] is a 54 year old right-handed black male with a history of intractable seizures. He indicates that he has a history of hepatitis C. He has developed a cold sensation in the feet that is bothersome to him, it may prevent her from sleeping well at night. The patient was given a prescription for Lyrica, but he indicates that he has not yet started the medication. He is on carbamazepine and Keppra for his seizures. Blood work done recently showed a slightly low sodium level of 131, and slightly elevated liver function tests. The patient indicates that the seizure frequency has reduced some, but he is still having about one seizure a week. An EEG study done was completely normal. The patient returns to this office for an evaluation. He is mainly concerned about his discomfort in the legs and feet at this time.  Past Medical History  Diagnosis Date  . Seizure   . ETOHism   . Aneurysm   . Hypertension   . Hepatitis C     Past Surgical History  Procedure Laterality Date  . Cerebral aneurysm repair      At [facility]  . I&d extremity Left 10/26/2012    Procedure: IRRIGATION AND DEBRIDEMENT Left Elbow;  Surgeon: Sheral Apley, MD;  [facility] OR;  Service: Orthopedics;  Laterality: Left;  . I&d extremity Left 10/29/2012    Procedure: IRRIGATION AND DEBRIDEMENT EXTREMITY, wound vac change, stimulan beads;  Surgeon: Sheral Apley, MD;  Location: [facility] OR;  Service: Orthopedics;  Laterality: Left;  lateral on bean bag  . Hardware removal Left 10/29/2012    Procedure: HARDWARE REMOVAL;  Surgeon: Sheral Apley, MD;  Location[facility] OR;  Service: Orthopedics;  Laterality: Left;  . Incision and drainage abscess Left 11/02/2012    Procedure: INCISION AND DRAINAGE ABSCESS;  Surgeon: Sheral Apley, MD;  Location: [facility] OR;  Service: Orthopedics;  Laterality: Left;  . I&d extremity Left  11/02/2012    Procedure: IRRIGATION AND DEBRIDEMENT EXTREMITY;  Surgeon: Sheral Apley, MD;  Location: [facility] OR;  Service: Orthopedics;  Laterality: Left;    Family History  Problem Relation Age of Onset  . Hypertension Mother   . Cancer Mother   . Hypertension Father   . Diabetes Sister   . Cancer Sister   . Cancer Sister     Social history:  reports that he has been smoking Cigarettes.  He has never used smokeless tobacco. He reports that he does not drink alcohol or use illicit drugs.   No Known Allergies  Medications:  Prior to Admission medications   Medication Sig Start Date End Date Taking? Authorizing Provider  carbamazepine (TEGRETOL) 200 MG tablet Take 2 tablets (400 mg total) by mouth 3 (three) times daily. 03/10/14  Yes [name], MD  folic acid (FOLVITE) 1 MG tablet Take 1 tablet (1 mg total) by mouth daily. 03/25/14  Yes [name], MD  levETIRAcetam (KEPPRA) 1000 MG tablet Take 1 tablet (1,000 mg total) by mouth 2 (two) times daily. 03/10/14  Yes [name], MD  metoprolol (LOPRESSOR) 50 MG tablet Take 0.5 tablets (25 mg total) by mouth 2 (two) times daily. 03/10/14  Yes [name], MD  pregabalin (LYRICA) 50 MG capsule One capsule twice a day for 2 weeks, then take 2 capsules twice a day 05/15/14   York Spaniel, MD  thiamine 100 MG tablet  Take 1 tablet (100 mg total) by mouth daily. Patient not taking: Reported on 05/15/2014 03/25/14   Doris Cheadleeepak Advani, MD     ROS:  Out of a complete 14 system review of symptoms, the patient complains only of the following symptoms, and all other reviewed systems are negative.  Appetite change, fatigue, excessive sweating Light sensitivity, double vision, eye pain, blurred vision Cough, wheezing Chest pain, leg swelling Insomnia, frequent waking Urgency of the bladder Back pain, achy muscles, muscle cramps, walking difficulties Skin rash Memory loss, dizziness, headache, numbness  Blood pressure 148/86, pulse  92.  Physical Exam  General: The patient is alert and cooperative at the time of the examination.  Skin: No significant peripheral edema is noted.   Neurologic Exam  Mental status: The patient is oriented x 3.  Cranial nerves: Facial symmetry is present. Speech is normal, no aphasia or dysarthria is noted. Extraocular movements are full. Visual fields are full.  Motor: The patient has good strength in all 4 extremities.  Sensory examination: Soft touch sensation is symmetric on the face, arms, and legs.  Coordination: The patient has good finger-nose-finger and heel-to-shin bilaterally.  Gait and station: The patient has a normal gait. Tandem gait is unsteady. Romberg is negative. No drift is seen.  Reflexes: Deep tendon reflexes are symmetric, but are depressed.   Assessment/Plan:  1. Intractable seizures  2. History of hepatitis C, elevated LFT's  3. Sensory alteration the feet, evaluate for peripheral neuropathy  The patient is having ongoing seizures. The Lyrica will be added both for seizure control and to help with what may be a neuropathy issue. The patient will be set up for nerve conduction studies of both legs and one arm. EMG evaluation will be done on one leg. If a peripheral neuropathy is documented, further blood work may be done. He will follow-up for the EMG evaluation.  [name] [name]. Keith Britanny Marksberry MD 05/15/2014 8:22 PM  [facility] [address] [place], [place] [address]  Phone [phone] Fax [phone]

## 2014-06-03 ENCOUNTER — Ambulatory Visit (INDEPENDENT_AMBULATORY_CARE_PROVIDER_SITE_OTHER): Payer: Medicaid Other | Admitting: Neurology

## 2014-06-03 ENCOUNTER — Ambulatory Visit (INDEPENDENT_AMBULATORY_CARE_PROVIDER_SITE_OTHER): Payer: Self-pay | Admitting: Neurology

## 2014-06-03 ENCOUNTER — Encounter: Payer: Self-pay | Admitting: Neurology

## 2014-06-03 DIAGNOSIS — G609 Hereditary and idiopathic neuropathy, unspecified: Secondary | ICD-10-CM | POA: Diagnosis not present

## 2014-06-03 DIAGNOSIS — R29898 Other symptoms and signs involving the musculoskeletal system: Secondary | ICD-10-CM

## 2014-06-03 DIAGNOSIS — R569 Unspecified convulsions: Secondary | ICD-10-CM

## 2014-06-03 HISTORY — DX: Hereditary and idiopathic neuropathy, unspecified: G60.9

## 2014-06-03 NOTE — Progress Notes (Signed)
Please refer to EMG and NCV procedure note. 

## 2014-06-03 NOTE — Progress Notes (Signed)
Please refer to EMG and nerve conduction study procedure note.  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 The patient does have hepatitis C which may result in neuropathies.  The patient will have blood work done today. We may consider lumbar puncture in the future to look for elevated protein of suggest CIDP. The patient will follow-up in about 3 months.

## 2014-06-03 NOTE — Procedures (Signed)
     HISTORY:  [name] [name] is a 54 year old gentleman with a history of hepatitis C who reports issues with numbness of the feet, with some gait instability. The patient is being evaluated for a possible peripheral neuropathy.  NERVE CONDUCTION STUDIES:  Nerve conduction studies were performed on the left upper extremity. The distal motor latencies and motor amplitudes for the median and ulnar nerves were normal. The F wave latencies were prolonged for these nerves, with slowing seen for the left median and ulnar nerves. The sensory latencies for the left median and ulnar nerves were prolonged.  Nerve conduction studies were performed on both lower extremities. The distal motor latencies for the peroneal nerves were prolonged bilaterally, normal for the posterior tibial nerves bilaterally. Low motor amplitudes were seen for these nerves bilaterally. The nerve conduction velocities for the peroneal and posterior tibial nerves were significantly slowed, and the F wave latencies were absent for the peroneal nerves bilaterally, significantly prolonged for the posterior tibial nerves bilaterally. The peroneal sensory latencies were unobtainable bilaterally.  EMG STUDIES:  EMG study was performed on the left lower extremity:  The tibialis anterior muscle reveals 2 to 4K motor units with minimally decreased recruitment. No fibrillations or positive waves were seen. The peroneus tertius muscle reveals 2 to 4K motor units with decreased recruitment. No fibrillations or positive waves were seen. The medial gastrocnemius muscle reveals 1 to 3K motor units with full recruitment. No fibrillations or positive waves were seen. The vastus lateralis muscle reveals 2 to 4K motor units with full recruitment. No fibrillations or positive waves were seen. The iliopsoas muscle reveals 2 to 4K motor units with full recruitment. No fibrillations or positive waves were seen. The biceps femoris muscle (long head)  reveals 2 to 4K motor units with full recruitment. No fibrillations or positive waves were seen. The lumbosacral paraspinal muscles were tested at 3 levels, and revealed no abnormalities of insertional activity at all 3 levels tested. There was good relaxation.   IMPRESSION:  Nerve conduction studies done on the left upper extremity and both lower extremities shows evidence of a peripheral neuropathy of moderate severity with mixed demyelinating and axonal features. EMG evaluation of the left lower extremity shows minimal chronic signs of denervation. No evidence of an overlying lumbosacral radiculopathy is seen.  Marlan Palau. Keith [name] MD 06/03/2014 1:57 PM  [facility] [address] [place], [place] [id number]  Phone [phone] Fax [phone]

## 2014-06-05 ENCOUNTER — Telehealth: Payer: Self-pay | Admitting: Neurology

## 2014-06-05 ENCOUNTER — Ambulatory Visit (HOSPITAL_COMMUNITY): Payer: Medicaid Other

## 2014-06-09 ENCOUNTER — Telehealth: Payer: Self-pay | Admitting: Neurology

## 2014-06-09 LAB — MULTIPLE MYELOMA PANEL, SERUM
ALBUMIN/GLOB SERPL: 1.2 (ref 0.7–2.0)
Albumin SerPl Elph-Mcnc: 4.4 g/dL (ref 3.2–5.6)
Alpha 1: 0.2 g/dL (ref 0.1–0.4)
Alpha2 Glob SerPl Elph-Mcnc: 1 g/dL (ref 0.4–1.2)
B-GLOBULIN SERPL ELPH-MCNC: 0.9 g/dL (ref 0.6–1.3)
GLOBULIN, TOTAL: 3.7 g/dL (ref 2.0–4.5)
Gamma Glob SerPl Elph-Mcnc: 1.6 g/dL (ref 0.5–1.6)
IGA/IMMUNOGLOBULIN A, SERUM: 320 mg/dL (ref 90–386)
IGG (IMMUNOGLOBIN G), SERUM: 1630 mg/dL — AB (ref 700–1600)
IGM (IMMUNOGLOBULIN M), SRM: 118 mg/dL (ref 20–172)
TOTAL PROTEIN: 8.1 g/dL (ref 6.0–8.5)

## 2014-06-09 LAB — ANGIOTENSIN CONVERTING ENZYME: ANGIO CONVERT ENZYME: 41 U/L (ref 14–82)

## 2014-06-09 LAB — ANA W/REFLEX: Anti Nuclear Antibody(ANA): NEGATIVE

## 2014-06-09 LAB — RHEUMATOID FACTOR: RHEUMATOID FACTOR: 22.8 [IU]/mL — AB (ref 0.0–13.9)

## 2014-06-09 LAB — VITAMIN B12: VITAMIN B 12: 363 pg/mL (ref 211–946)

## 2014-06-09 LAB — HIV ANTIBODY (ROUTINE TESTING W REFLEX): HIV SCREEN 4TH GENERATION: NONREACTIVE

## 2014-06-09 LAB — CRYOGLOBULIN

## 2014-06-09 LAB — B. BURGDORFI ANTIBODIES: Lyme IgG/IgM Ab: <0.91 {ISR} (ref 0.00–0.90)

## 2014-06-09 NOTE — Telephone Encounter (Signed)
I called the patient, the blood work that was done was unremarkable exception of a very minimal elevation in the rheumatoid factor, not likely to be clinically significant.

## 2014-06-12 ENCOUNTER — Ambulatory Visit: Payer: Medicaid Other | Attending: Internal Medicine | Admitting: Internal Medicine

## 2014-06-12 ENCOUNTER — Encounter: Payer: Self-pay | Admitting: Internal Medicine

## 2014-06-12 VITALS — BP 145/92 | HR 72 | Temp 98.3°F | Resp 16 | Wt 217.0 lb

## 2014-06-12 DIAGNOSIS — B182 Chronic viral hepatitis C: Secondary | ICD-10-CM | POA: Diagnosis not present

## 2014-06-12 DIAGNOSIS — Z720 Tobacco use: Secondary | ICD-10-CM | POA: Diagnosis not present

## 2014-06-12 DIAGNOSIS — R569 Unspecified convulsions: Secondary | ICD-10-CM

## 2014-06-12 DIAGNOSIS — I10 Essential (primary) hypertension: Secondary | ICD-10-CM

## 2014-06-12 DIAGNOSIS — F17200 Nicotine dependence, unspecified, uncomplicated: Secondary | ICD-10-CM

## 2014-06-12 MED ORDER — LEVETIRACETAM 1000 MG PO TABS: 1000.0000 mg | ORAL_TABLET | Freq: Two times a day (BID) | ORAL | Status: DC

## 2014-06-12 MED ORDER — FOLIC ACID 1 MG PO TABS: 1.0000 mg | ORAL_TABLET | Freq: Every day | ORAL | Status: DC

## 2014-06-12 MED ORDER — CARBAMAZEPINE 200 MG PO TABS: 400.0000 mg | ORAL_TABLET | Freq: Three times a day (TID) | ORAL | Status: DC

## 2014-06-12 MED ORDER — METOPROLOL TARTRATE 50 MG PO TABS: 25.0000 mg | ORAL_TABLET | Freq: Two times a day (BID) | ORAL | Status: DC

## 2014-06-12 NOTE — Progress Notes (Signed)
Patient here for follow up on his neuropathy and seizure disorder Patient also needs refills on his medications

## 2014-06-12 NOTE — Progress Notes (Signed)
MRN: [id number] Name: [name] [name]  Sex: male Age: 54 y.o. DOB: 1960-04-12  Allergies: Review of patient's allergies indicates no known allergies.  Chief Complaint  Patient presents with  . Follow-up    HPI: Patient is 54 y.o. male who Patient has history of seizure disorder currently following up with neurologist and was also evaluated for peripheral neuropathy and has been started on Lyrica, today's blood pressure is borderline elevated, denies any headache dizziness chest and shortness of breath, patient is requesting refill on his seizure medication, patient also has history of chronic hep C already following up with her infectious disease.patient currently denies drinking alcohol but is to smoke cigarettes.   Past Medical History  Diagnosis Date  . Seizure   . ETOHism   . Aneurysm   . Hypertension   . Hepatitis C   . Hereditary and idiopathic peripheral neuropathy 06/03/2014    Past Surgical History  Procedure Laterality Date  . Cerebral aneurysm repair      [facility]  . I&d extremity Left 10/26/2012    Procedure: IRRIGATION AND DEBRIDEMENT Left Elbow;  Surgeon: Sheral Apleyimothy D Murphy, MD;  [facility];  Service: Orthopedics;  Laterality: Left;  . I&d extremity Left 10/29/2012    Procedure: IRRIGATION AND DEBRIDEMENT EXTREMITY, wound vac change, stimulan beads;  Surgeon: Sheral Apleyimothy D Murphy, MD;  Location: [facility] OR;  Service: Orthopedics;  Laterality: Left;  lateral on bean bag  . Hardware removal Left 10/29/2012    Procedure: HARDWARE REMOVAL;  Surgeon: Sheral Apleyimothy D Murphy, MD;  [facility];  Service: Orthopedics;  Laterality: Left;  . Incision and drainage abscess Left 11/02/2012    Procedure: INCISION AND DRAINAGE ABSCESS;  Surgeon: Sheral Apleyimothy D Murphy, MD;  Location: [facility] OR;  Service: Orthopedics;  Laterality: Left;  . I&d extremity Left 11/02/2012    Procedure: IRRIGATION AND DEBRIDEMENT EXTREMITY;  Surgeon: Sheral Apleyimothy D Murphy, MD;  Location: [facility] OR;  Service:  Orthopedics;  Laterality: Left;      Medication List       This list is accurate as of: 06/12/14  3:26 PM.  Always use your most recent med list.               carbamazepine 200 MG tablet  Commonly known as:  TEGRETOL  Take 2 tablets (400 mg total) by mouth 3 (three) times daily.     folic acid 1 MG tablet  Commonly known as:  FOLVITE  Take 1 tablet (1 mg total) by mouth daily.     levETIRAcetam 1000 MG tablet  Commonly known as:  KEPPRA  Take 1 tablet (1,000 mg total) by mouth 2 (two) times daily.     metoprolol 50 MG tablet  Commonly known as:  LOPRESSOR  Take 0.5 tablets (25 mg total) by mouth 2 (two) times daily.     pregabalin 50 MG capsule  Commonly known as:  LYRICA  One capsule twice a day for 2 weeks, then take 2 capsules twice a day     thiamine 100 MG tablet  Take 1 tablet (100 mg total) by mouth daily.        Meds ordered this encounter  Medications  . carbamazepine (TEGRETOL) 200 MG tablet    Sig: Take 2 tablets (400 mg total) by mouth 3 (three) times daily.    Dispense:  180 tablet    Refill:  3  . folic acid (FOLVITE) 1 MG tablet    Sig: Take 1 tablet (1 mg total)  by mouth daily.    Dispense:  30 tablet    Refill:  2  . levETIRAcetam (KEPPRA) 1000 MG tablet    Sig: Take 1 tablet (1,000 mg total) by mouth 2 (two) times daily.    Dispense:  60 tablet    Refill:  3  . metoprolol (LOPRESSOR) 50 MG tablet    Sig: Take 0.5 tablets (25 mg total) by mouth 2 (two) times daily.    Dispense:  60 tablet    Refill:  3     There is no immunization history on file for this patient.  Family History  Problem Relation Age of Onset  . Hypertension Mother   . Cancer Mother   . Hypertension Father   . Diabetes Sister   . Cancer Sister   . Cancer Sister     History  Substance Use Topics  . Smoking status: Current Some Day Smoker    Types: Cigarettes  . Smokeless tobacco: Never Used  . Alcohol Use: No     Comment: quit drinking 11/2011 per patient      Review of Systems   As noted in HPI  Filed Vitals:   06/12/14 1440  BP: 145/92  Pulse: 72  Temp: 98.3 F (36.8 C)  Resp: 16    Physical Exam  Physical Exam  Constitutional: No distress.  Eyes: EOM are normal. Pupils are equal, round, and reactive to light.  Cardiovascular: Normal rate and regular rhythm.   Pulmonary/Chest: Breath sounds normal. No respiratory distress. He has no wheezes. He has no rales.  Musculoskeletal: He exhibits no edema.    CBC    Component Value Date/Time   WBC 6.2 04/08/2014 1556   WBC 6.2 04/08/2014 1556   WBC 9.9 01/24/2014 0500   RBC 4.50 04/08/2014 1556   RBC 4.50 04/08/2014 1556   RBC 3.55* 01/24/2014 0500   HGB 15.8 04/08/2014 1556   HGB 15.8 04/08/2014 1556   HCT 45.2 04/08/2014 1556   HCT 45.2 04/08/2014 1556   PLT 174 04/08/2014 1556   PLT 174 04/08/2014 1556   MCV 100* 04/08/2014 1556   MCV 100* 04/08/2014 1556   LYMPHSABS 2.5 04/08/2014 1556   LYMPHSABS 2.5 04/08/2014 1556   LYMPHSABS 3.3 01/24/2014 0500   MONOABS 0.9 01/24/2014 0500   EOSABS 0.0 04/08/2014 1556   EOSABS 0.0 04/08/2014 1556   EOSABS 0.1 01/24/2014 0500   BASOSABS 0.0 04/08/2014 1556   BASOSABS 0.0 04/08/2014 1556   BASOSABS 0.0 01/24/2014 0500    CMP     Component Value Date/Time   NA 131* 04/08/2014 1556   NA 139 03/10/2014 1504   K 4.6 04/08/2014 1556   CL 92* 04/08/2014 1556   CO2 22 04/08/2014 1556   GLUCOSE 101* 04/08/2014 1556   GLUCOSE 98 03/10/2014 1504   BUN 6 04/08/2014 1556   BUN 12 03/10/2014 1504   CREATININE 0.73* 04/08/2014 1556   CREATININE 0.77 03/10/2014 1504   CALCIUM 9.2 04/08/2014 1556   PROT 8.1 06/03/2014 1419   PROT 7.8 03/10/2014 1504   ALBUMIN 4.2 03/10/2014 1504   AST 53* 04/08/2014 1556   ALT 51* 04/08/2014 1556   ALKPHOS 108 04/08/2014 1556   BILITOT 0.5 04/08/2014 1556   GFRNONAA 104 04/08/2014 1556   GFRNONAA >89 03/10/2014 1504   GFRAA 121 04/08/2014 1556   GFRAA >89 03/10/2014 1504    No results  found for: CHOL  No components found for: HGA1C  Lab Results  Component Value Date/Time  AST 53* 04/08/2014 03:56 PM    Assessment and Plan  Convulsions, unspecified convulsion type - Plan:patient is given refill on his medications, advised patient follow with the neurologist carbamazepine (TEGRETOL) 200 MG tablet, levETIRAcetam (KEPPRA) 1000 MG tablet  Essential hypertension, benign - Plan:blood pressure is borderline elevated, advise patient for DASH diet, continue with metoprolol (LOPRESSOR) 50 MG tablet  Chronic hepatitis C without hepatic coma Continue to follow with infectious disease.  Smoking Again counseled patient to quit smoking.    Return in about 3 months (around 09/12/2014) for hypertension.   This note has been created with Education officer, environmental. Any transcriptional errors are unintentional.    [name], MD

## 2014-06-12 NOTE — Patient Instructions (Signed)
DASH Eating Plan °DASH stands for "Dietary Approaches to Stop Hypertension." The DASH eating plan is a healthy eating plan that has been shown to reduce high blood pressure (hypertension). Additional health benefits may include reducing the risk of type 2 diabetes mellitus, heart disease, and stroke. The DASH eating plan may also help with weight loss. °WHAT DO I NEED TO KNOW ABOUT THE DASH EATING PLAN? °For the DASH eating plan, you will follow these general guidelines: °· Choose foods with a percent daily value for sodium of less than 5% (as listed on the food label). °· Use salt-free seasonings or herbs instead of table salt or sea salt. °· Check with your health care provider or pharmacist before using salt substitutes. °· Eat lower-sodium products, often labeled as "lower sodium" or "no salt added." °· Eat fresh foods. °· Eat more vegetables, fruits, and low-fat dairy products. °· Choose whole grains. Look for the word "whole" as the first word in the ingredient list. °· Choose fish and skinless chicken or turkey more often than red meat. Limit fish, poultry, and meat to 6 oz (170 g) each day. °· Limit sweets, desserts, sugars, and sugary drinks. °· Choose heart-healthy fats. °· Limit cheese to 1 oz (28 g) per day. °· Eat more home-cooked food and less restaurant, buffet, and fast food. °· Limit fried foods. °· Cook foods using methods other than frying. °· Limit canned vegetables. If you do use them, rinse them well to decrease the sodium. °· When eating at a restaurant, ask that your food be prepared with less salt, or no salt if possible. °WHAT FOODS CAN I EAT? °Seek help from a dietitian for individual calorie needs. °Grains °Whole grain or whole wheat bread. Brown rice. Whole grain or whole wheat pasta. Quinoa, bulgur, and whole grain cereals. Low-sodium cereals. Corn or whole wheat flour tortillas. Whole grain cornbread. Whole grain crackers. Low-sodium crackers. °Vegetables °Fresh or frozen vegetables  (raw, steamed, roasted, or grilled). Low-sodium or reduced-sodium tomato and vegetable juices. Low-sodium or reduced-sodium tomato sauce and paste. Low-sodium or reduced-sodium canned vegetables.  °Fruits °All fresh, canned (in natural juice), or frozen fruits. °Meat and Other Protein Products °Ground beef (85% or leaner), grass-fed beef, or beef trimmed of fat. Skinless chicken or turkey. Ground chicken or turkey. Pork trimmed of fat. All fish and seafood. Eggs. Dried beans, peas, or lentils. Unsalted nuts and seeds. Unsalted canned beans. °Dairy °Low-fat dairy products, such as skim or 1% milk, 2% or reduced-fat cheeses, low-fat ricotta or cottage cheese, or plain low-fat yogurt. Low-sodium or reduced-sodium cheeses. °Fats and Oils °Tub margarines without trans fats. Light or reduced-fat mayonnaise and salad dressings (reduced sodium). Avocado. Safflower, olive, or canola oils. Natural peanut or almond butter. °Other °Unsalted popcorn and pretzels. °The items listed above may not be a complete list of recommended foods or beverages. Contact your dietitian for more options. °WHAT FOODS ARE NOT RECOMMENDED? °Grains °White bread. White pasta. White rice. Refined cornbread. Bagels and croissants. Crackers that contain trans fat. °Vegetables °Creamed or fried vegetables. Vegetables in a cheese sauce. Regular canned vegetables. Regular canned tomato sauce and paste. Regular tomato and vegetable juices. °Fruits °Dried fruits. Canned fruit in light or heavy syrup. Fruit juice. °Meat and Other Protein Products °Fatty cuts of meat. Ribs, chicken wings, bacon, sausage, bologna, salami, chitterlings, fatback, hot dogs, bratwurst, and packaged luncheon meats. Salted nuts and seeds. Canned beans with salt. °Dairy °Whole or 2% milk, cream, half-and-half, and cream cheese. Whole-fat or sweetened yogurt. Full-fat   cheeses or blue cheese. Nondairy creamers and whipped toppings. Processed cheese, cheese spreads, or cheese  curds. °Condiments °Onion and garlic salt, seasoned salt, table salt, and sea salt. Canned and packaged gravies. Worcestershire sauce. Tartar sauce. Barbecue sauce. Teriyaki sauce. Soy sauce, including reduced sodium. Steak sauce. Fish sauce. Oyster sauce. Cocktail sauce. Horseradish. Ketchup and mustard. Meat flavorings and tenderizers. Bouillon cubes. Hot sauce. Tabasco sauce. Marinades. Taco seasonings. Relishes. °Fats and Oils °Butter, stick margarine, lard, shortening, ghee, and bacon fat. Coconut, palm kernel, or palm oils. Regular salad dressings. °Other °Pickles and olives. Salted popcorn and pretzels. °The items listed above may not be a complete list of foods and beverages to avoid. Contact your dietitian for more information. °WHERE CAN I FIND MORE INFORMATION? °National Heart, Lung, and Blood Institute: www.nhlbi.nih.gov/health/health-topics/topics/dash/ °Document Released: 02/24/2011 Document Revised: 07/22/2013 Document Reviewed: 01/09/2013 °ExitCare® Patient Information ©2015 [employer]. This information is not intended to replace advice given to you by your health care provider. Make sure you discuss any questions you have with your health care provider. ° °

## 2014-07-02 ENCOUNTER — Encounter: Payer: Self-pay | Admitting: Adult Health

## 2014-07-02 ENCOUNTER — Ambulatory Visit (INDEPENDENT_AMBULATORY_CARE_PROVIDER_SITE_OTHER): Payer: Medicaid Other | Admitting: Adult Health

## 2014-07-02 VITALS — BP 131/84 | HR 77 | Ht 76.0 in | Wt 206.0 lb

## 2014-07-02 DIAGNOSIS — G609 Hereditary and idiopathic neuropathy, unspecified: Secondary | ICD-10-CM | POA: Diagnosis not present

## 2014-07-02 DIAGNOSIS — R569 Unspecified convulsions: Secondary | ICD-10-CM | POA: Diagnosis not present

## 2014-07-02 MED ORDER — PREGABALIN 50 MG PO CAPS: ORAL_CAPSULE | ORAL | Status: DC

## 2014-07-02 NOTE — Progress Notes (Signed)
PATIENT: [name] [name] DOB: Jan 28, 1961  REASON FOR VISIT: follow up- seizures, neuropathy HISTORY FROM: patient  HISTORY OF PRESENT ILLNESS: [name] [name] is a 54 year old black male with a history of intractable seizures. He returns today for follow-up. The patient is currently taking Keppra and carbamazepine. At the last visit the patient was started on Lyrica 100 mg twice a day. He states that this has improved his seizure frequency. He was having 3 seizures a week and now is only having 1 seizure a week. He states this is also improved the burning and tingling in his legs. He states that he does continue to have the burning and tingling but is much improved since the Lyrica was started. He denies any new neurological symptoms. No new medical issues. He returns today for follow-up.  HISTORY 05/15/14 Anne Hahn): [name] [name] is a 54 year old right-handed black male with a history of intractable seizures. He indicates that he has a history of hepatitis C. He has developed a cold sensation in the feet that is bothersome to him, it may prevent her from sleeping well at night. The patient was given a prescription for Lyrica, but he indicates that he has not yet started the medication. He is on carbamazepine and Keppra for his seizures. Blood work done recently showed a slightly low sodium level of 131, and slightly elevated liver function tests. The patient indicates that the seizure frequency has reduced some, but he is still having about one seizure a week. An EEG study done was completely normal. The patient returns to this office for an evaluation. He is mainly concerned about his discomfort in the legs and feet at this time.  REVIEW OF SYSTEMS: Out of a complete 14 system review of symptoms, the patient complains only of the following symptoms, and all other reviewed systems are negative.  See HPI  ALLERGIES: No Known Allergies  HOME MEDICATIONS: Outpatient Prescriptions Prior to Visit    Medication Sig Dispense Refill  . carbamazepine (TEGRETOL) 200 MG tablet Take 2 tablets (400 mg total) by mouth 3 (three) times daily. 180 tablet 3  . folic acid (FOLVITE) 1 MG tablet Take 1 tablet (1 mg total) by mouth daily. 30 tablet 2  . levETIRAcetam (KEPPRA) 1000 MG tablet Take 1 tablet (1,000 mg total) by mouth 2 (two) times daily. 60 tablet 3  . metoprolol (LOPRESSOR) 50 MG tablet Take 0.5 tablets (25 mg total) by mouth 2 (two) times daily. 60 tablet 3  . pregabalin (LYRICA) 50 MG capsule One capsule twice a day for 2 weeks, then take 2 capsules twice a day 120 capsule 3  . thiamine 100 MG tablet Take 1 tablet (100 mg total) by mouth daily. (Patient not taking: Reported on 05/15/2014) 30 tablet 2   No facility-administered medications prior to visit.    PAST MEDICAL HISTORY: Past Medical History  Diagnosis Date  . Seizure   . ETOHism   . Aneurysm   . Hypertension   . Hepatitis C   . Hereditary and idiopathic peripheral neuropathy 06/03/2014    PAST SURGICAL HISTORY: Past Surgical History  Procedure Laterality Date  . Cerebral aneurysm repair      At [facility]  . I&d extremity Left 10/26/2012    Procedure: IRRIGATION AND DEBRIDEMENT Left Elbow;  Surgeon: Sheral Apley, MD;  Location: [facility] OR;  Service: Orthopedics;  Laterality: Left;  . I&d extremity Left 10/29/2012    Procedure: IRRIGATION AND DEBRIDEMENT EXTREMITY, wound vac change, stimulan beads;  Surgeon: Marcial Pacas  Jamison Neighbor Murphy, MD;  Location: [facility] OR;  Service: Orthopedics;  Laterality: Left;  lateral on bean bag  . Hardware removal Left 10/29/2012    Procedure: HARDWARE REMOVAL;  Surgeon: Sheral Apleyimothy D Murphy, MD;  [facility];  Service: Orthopedics;  Laterality: Left;  . Incision and drainage abscess Left 11/02/2012    Procedure: INCISION AND DRAINAGE ABSCESS;  Surgeon: Sheral Apleyimothy D Murphy, MD;  Location: [facility] OR;  Service: Orthopedics;  Laterality: Left;  . I&d extremity Left 11/02/2012    Procedure: IRRIGATION AND DEBRIDEMENT  EXTREMITY;  Surgeon: Sheral Apleyimothy D Murphy, MD;  Location: [facility] OR;  Service: Orthopedics;  Laterality: Left;    FAMILY HISTORY: Family History  Problem Relation Age of Onset  . Hypertension Mother   . Cancer Mother   . Hypertension Father   . Diabetes Sister   . Cancer Sister   . Cancer Sister     SOCIAL HISTORY: History   Social History  . Marital Status: Single    Spouse Name: N/A  . Number of Children: 3  . Years of Education: N/A   Occupational History  . disabled    Social History Main Topics  . Smoking status: Current Some Day Smoker    Types: Cigarettes  . Smokeless tobacco: Never Used  . Alcohol Use: No     Comment: quit drinking 11/2011 per patient  . Drug Use: No  . Sexual Activity: Not Currently   Other Topics Concern  . Not on file   Social History Narrative   Patient is right handed.   Patient does not drink caffeine      PHYSICAL EXAM  Filed Vitals:   07/02/14 1400  BP: 131/84  Pulse: 77  Height: 6\' 4"  (1.93 m)  Weight: 206 lb (93.441 kg)   Body mass index is 25.09 kg/(m^2).  Generalized: Well developed, in no acute distress   Neurological examination  Mentation: Alert oriented to time, place, history taking. Follows all commands speech and language fluent Cranial nerve II-XII: Pupils were equal round reactive to light. Extraocular movements were full, visual field were full on confrontational test. Facial sensation and strength were normal. Uvula tongue midline. Head turning and shoulder shrug  were normal and symmetric. Motor: The motor testing reveals 5 over 5 strength of all 4 extremities. Good symmetric motor tone is noted throughout.  Sensory: Sensory testing is intact to soft touch on all 4 extremities but decreased on the right arm. No evidence of extinction is noted.  Coordination: Cerebellar testing reveals good finger-nose-finger and heel-to-shin bilaterally.  Gait and station: Gait is normal. Tandem gait is unsteady. Romberg is  negative but negative. No drift is seen.  Reflexes: Deep tendon reflexes are symmetric and normal bilaterally.    DIAGNOSTIC DATA (LABS, IMAGING, TESTING) - I reviewed patient records, labs, notes, testing and imaging myself where available.  Lab Results  Component Value Date   WBC 6.2 04/08/2014   WBC 6.2 04/08/2014   HGB 15.8 04/08/2014   HGB 15.8 04/08/2014   HCT 45.2 04/08/2014   HCT 45.2 04/08/2014   MCV 100* 04/08/2014   MCV 100* 04/08/2014   PLT 174 04/08/2014   PLT 174 04/08/2014      Component Value Date/Time   NA 131* 04/08/2014 1556   NA 139 03/10/2014 1504   K 4.6 04/08/2014 1556   CL 92* 04/08/2014 1556   CO2 22 04/08/2014 1556   GLUCOSE 101* 04/08/2014 1556   GLUCOSE 98 03/10/2014 1504   BUN 6 04/08/2014 1556  BUN 12 03/10/2014 1504   CREATININE 0.73* 04/08/2014 1556   CREATININE 0.77 03/10/2014 1504   CALCIUM 9.2 04/08/2014 1556   PROT 8.1 06/03/2014 1419   PROT 7.8 03/10/2014 1504   ALBUMIN 4.2 03/10/2014 1504   AST 53* 04/08/2014 1556   ALT 51* 04/08/2014 1556   ALKPHOS 108 04/08/2014 1556   BILITOT 0.5 04/08/2014 1556   GFRNONAA 104 04/08/2014 1556   GFRNONAA >89 03/10/2014 1504   GFRAA 121 04/08/2014 1556   GFRAA >89 03/10/2014 1504   No results found for: CHOL, HDL, LDLCALC, LDLDIRECT, TRIG, CHOLHDL Lab Results  Component Value Date   HGBA1C 5.7* 01/24/2014   Lab Results  Component Value Date   VITAMINB12 363 06/03/2014   Lab Results  Component Value Date   TSH 3.550 01/24/2014      ASSESSMENT AND PLAN 54 y.o. year old male  has a past medical history of Seizure; ETOHism; Aneurysm; Hypertension; Hepatitis C; and Hereditary and idiopathic peripheral neuropathy (06/03/2014). here with:  1. Seizures 2. Neuropathy  The patient's seizure frequency has improved. He is now having 1 seizure a week versus 3 seizures. The patient's neuropathy pain has also improved with Lyrica. I will increase his Lyrica to 2 tablets in the morning, 1  tablet at noon and 2 tablets at bedtime. He will let me know if this is beneficial. He will continue taking carbamazepine and Keppra. If his symptoms worsen or he develops new symptoms he'll let us know. Otherwise he'll follow-up in 4-5 months.  Butch Penny, MSN, NP-C 07/02/2014, 2:00 PM [facility] [address] [place], [place] [address] [phone]  Note: This document was prepared with digital dictation and possible smart phrase technology. Any transcriptional errors that result from this process are unintentional.

## 2014-07-02 NOTE — Progress Notes (Signed)
I have read the note, and I agree with the clinical assessment and plan.  Merlyn Conley KEITH   

## 2014-07-02 NOTE — Patient Instructions (Signed)
Increase Lyrica to 2 tablets in the morning, 1 tablet at noon and 2 tablets in the evening.  Continue Carbamazepine and Keppra Let us know if your seizures don't improve or if you are unable to tolerate the increase let us know.
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
## 2014-07-15 ENCOUNTER — Telehealth: Payer: Self-pay | Admitting: Neurology

## 2014-07-15 NOTE — Telephone Encounter (Signed)
I called back.  Verified the current dose on Rx.  They verbalized understanding and will call back if anything further is needed.

## 2014-07-15 NOTE — Telephone Encounter (Signed)
Kim from [facility] called wanting to clarify pt's dosage and changes for pregabalin (LYRICA) 50 MG capsule. Please call Kim # [phone]

## 2014-08-05 ENCOUNTER — Encounter: Payer: Self-pay | Admitting: Internal Medicine

## 2014-08-05 ENCOUNTER — Ambulatory Visit (INDEPENDENT_AMBULATORY_CARE_PROVIDER_SITE_OTHER): Payer: Medicaid Other | Admitting: Internal Medicine

## 2014-08-05 VITALS — BP 157/88 | HR 76 | Temp 98.0°F | Wt 211.0 lb

## 2014-08-05 DIAGNOSIS — B182 Chronic viral hepatitis C: Secondary | ICD-10-CM | POA: Diagnosis not present

## 2014-08-05 NOTE — Progress Notes (Signed)
   Subjective:    Patient ID: [name] [name], male    DOB: [birth year], 54 y.o.   MRN: [id number]  HPI He is here for follow-up of hepatitis C.  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 He is hepatitis A and B immune. He did have elastography ordered however the patient canceled the appointment. It is unclear why. He also has a seizure disorder and is on carbamazepine along with Lyrica and Keppra. There is an absolute contraindication with carbamazepine and all the hepatitis C drug options. His recent neurology visit confirmed with the patient states that he is having continuous seizures though is better on the 3 drug regimen. The patient today is angry due to not being able to see well and losing his glasses and due to pain in his back. The patient did not engage in discussion on his hepatitis C.   Review of Systems  Constitutional: Negative for fatigue.  Eyes: Positive for visual disturbance.  Musculoskeletal: Positive for back pain.       Objective:   Physical Exam  Constitutional: He appears well-developed and well-nourished. No distress.  Poor dentition  Eyes: No scleral icterus.  Cardiovascular: Normal rate, regular rhythm and normal heart sounds.   No murmur heard. Pulmonary/Chest: Effort normal and breath sounds normal. No respiratory distress.  Skin: No rash noted.          Assessment & Plan:

## 2014-08-05 NOTE — Assessment & Plan Note (Signed)
Unfortunately, with the carbamazepine, I will be unable to get him on medication for hepatitis C at this time. Also he did not schedule his liver staging with elastography. He will need to have this test done and I will see him again in one year to see if he is off the carbamazepine or if new hepatitis C medications not have the same interactions.

## 2014-08-07 ENCOUNTER — Ambulatory Visit: Payer: Medicaid Other | Admitting: Adult Health

## 2014-08-15 ENCOUNTER — Other Ambulatory Visit: Payer: Self-pay | Admitting: *Deleted

## 2014-08-15 MED ORDER — FOLIC ACID 1 MG PO TABS: 1.0000 mg | ORAL_TABLET | Freq: Every day | ORAL | Status: DC

## 2014-08-22 ENCOUNTER — Emergency Department (HOSPITAL_COMMUNITY)
Admission: EM | Admit: 2014-08-22 | Discharge: 2014-08-22 | Discharge status: Against Medical Advice | Payer: Medicaid Other | Attending: Emergency Medicine | Admitting: Emergency Medicine

## 2014-08-22 ENCOUNTER — Emergency Department (HOSPITAL_COMMUNITY): Payer: Medicaid Other

## 2014-08-22 ENCOUNTER — Encounter (HOSPITAL_COMMUNITY): Payer: Self-pay | Admitting: Emergency Medicine

## 2014-08-22 DIAGNOSIS — Y9289 Other specified places as the place of occurrence of the external cause: Secondary | ICD-10-CM | POA: Insufficient documentation

## 2014-08-22 DIAGNOSIS — Y998 Other external cause status: Secondary | ICD-10-CM | POA: Diagnosis not present

## 2014-08-22 DIAGNOSIS — Z720 Tobacco use: Secondary | ICD-10-CM | POA: Diagnosis not present

## 2014-08-22 DIAGNOSIS — S29001A Unspecified injury of muscle and tendon of front wall of thorax, initial encounter: Secondary | ICD-10-CM | POA: Diagnosis not present

## 2014-08-22 DIAGNOSIS — S0990XA Unspecified injury of head, initial encounter: Secondary | ICD-10-CM | POA: Diagnosis not present

## 2014-08-22 DIAGNOSIS — G40909 Epilepsy, unspecified, not intractable, without status epilepticus: Secondary | ICD-10-CM | POA: Insufficient documentation

## 2014-08-22 DIAGNOSIS — I10 Essential (primary) hypertension: Secondary | ICD-10-CM | POA: Insufficient documentation

## 2014-08-22 DIAGNOSIS — Y9389 Activity, other specified: Secondary | ICD-10-CM | POA: Insufficient documentation

## 2014-08-22 DIAGNOSIS — Z8619 Personal history of other infectious and parasitic diseases: Secondary | ICD-10-CM | POA: Insufficient documentation
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
## 2014-08-22 NOTE — ED Notes (Addendum)
Patient is awake and yelling at staff currently. PA Joe at bedside
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
## 2014-08-22 NOTE — ED Notes (Addendum)
CT/DG just resulted; pt aware provider must review results and will be at bedside shortly. Pt agitated; therapeutic discussion used to calm pt. Pt also informed will be given sandwich per request post provider updating pt and if exam is normal.
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
## 2014-08-22 NOTE — ED Notes (Signed)
Per PA Joe, patient was advised not to leave. Patient has been agitated since he arrived earlier today. Patient still wanting to leave. Patient got up and started walking out on his own and I was able to get him in a wheel chair to prevent a fall. NT Anneiah wheeled him out of ED. PA Joe notified. RN Lequita HaltMorgan took patient up to ICU during this.

## 2014-08-22 NOTE — ED Notes (Addendum)
Pt given cordless phone per request to contact family. Pt states family has home medications. Romeo AppleHarrison MD walking past pt during conversation and verbalizes to pt pharmacy with assure medications are confirmed in our system. Romeo AppleHarrison MD also made aware of pt complaint headache and bilateral rib pain.

## 2014-08-22 NOTE — ED Notes (Addendum)
Pickering MD made aware pt agitated at present time regarding wait time. Pt repositioned and reassured MD will be with him shortly.

## 2014-08-22 NOTE — ED Notes (Addendum)
Per Joe PA pt left AMA after explained risks of leaving against medical advice. Pt continues to becomes agitated post therapeutic intervention. Pt demands wheelchair and is wheeled via wheelchair by Annieah who states pt began walk as halfway wheeled out of department.
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
## 2014-09-03 ENCOUNTER — Ambulatory Visit (INDEPENDENT_AMBULATORY_CARE_PROVIDER_SITE_OTHER): Payer: Medicaid Other | Admitting: Neurology

## 2014-09-03 ENCOUNTER — Telehealth: Payer: Self-pay | Admitting: Neurology

## 2014-09-03 ENCOUNTER — Encounter: Payer: Self-pay | Admitting: Neurology

## 2014-09-03 VITALS — BP 132/76 | HR 62 | Ht 77.0 in | Wt 207.2 lb

## 2014-09-03 DIAGNOSIS — R569 Unspecified convulsions: Secondary | ICD-10-CM

## 2014-09-03 DIAGNOSIS — G609 Hereditary and idiopathic neuropathy, unspecified: Secondary | ICD-10-CM

## 2014-09-03 MED ORDER — LEVETIRACETAM 1000 MG PO TABS: 1000.0000 mg | ORAL_TABLET | Freq: Two times a day (BID) | ORAL | Status: DC

## 2014-09-03 MED ORDER — PREGABALIN 50 MG PO CAPS
ORAL_CAPSULE | ORAL | Status: DC
Start: 2014-09-03 — End: 2016-05-26

## 2014-09-03 MED ORDER — PREGABALIN 50 MG PO CAPS
ORAL_CAPSULE | ORAL | Status: DC
Start: 2014-09-03 — End: 2014-10-31

## 2014-09-03 MED ORDER — CARBAMAZEPINE 200 MG PO TABS: 400.0000 mg | ORAL_TABLET | Freq: Three times a day (TID) | ORAL | Status: DC
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
## 2014-09-03 NOTE — Telephone Encounter (Signed)
It appears 3 medications were prescribed today.  I called the pharmacy back.  Spoke with [facility] [employer].  We went over meds.  She reviewed file and indicated nothing further is needed at this time.

## 2014-09-03 NOTE — Telephone Encounter (Signed)
Tresa Endo from [facility] called and stated that they were waiting on two medications that Dr. Anne Hahn started him on today, he doesn't not know the name of the medications. He received one Rx. carbamazepine (TEGRETOL) 200 MG tablet. But says that he should have two more. Please call and advise.

## 2014-09-03 NOTE — Patient Instructions (Signed)

## 2014-09-09 ENCOUNTER — Telehealth: Payer: Self-pay | Admitting: Neurology

## 2014-09-09 NOTE — Telephone Encounter (Signed)
Events noted, agree the patient needs supervision, medications need to be taken away from the patient.

## 2014-09-09 NOTE — Telephone Encounter (Signed)
I called Kim. She is the patient's care coordinator. She is concerned the patient has taken a week's worth of medications, specifically Tegretol and Keppra, in the past 4-5 days. These were in "bubble packs" so she can easily see how many are gone. The Lyrica is in a bottle and she is not sure how much he took of that. She is in contact with [facility] who has not received Dr. Anne Hahn' home health order. I will send this first thing in the morning. The plan is to have the home health nurse administer the patient's medications in the future to prevent him from over medicating.

## 2014-09-09 NOTE — Telephone Encounter (Signed)
[name] [name] called and stated that the patient has taken a week worth of medication since last Friday. She would like to speak with someone regarding this because she is worried. Please call and advise.

## 2014-09-10 ENCOUNTER — Telehealth: Payer: Self-pay

## 2014-09-10 ENCOUNTER — Ambulatory Visit: Payer: Medicaid Other | Attending: Internal Medicine | Admitting: Internal Medicine

## 2014-09-10 ENCOUNTER — Encounter: Payer: Self-pay | Admitting: Internal Medicine

## 2014-09-10 VITALS — BP 140/90 | HR 85 | Temp 98.0°F | Resp 16 | Wt 207.0 lb

## 2014-09-10 DIAGNOSIS — F17210 Nicotine dependence, cigarettes, uncomplicated: Secondary | ICD-10-CM | POA: Insufficient documentation

## 2014-09-10 DIAGNOSIS — R569 Unspecified convulsions: Secondary | ICD-10-CM

## 2014-09-10 DIAGNOSIS — F17200 Nicotine dependence, unspecified, uncomplicated: Secondary | ICD-10-CM

## 2014-09-10 DIAGNOSIS — I10 Essential (primary) hypertension: Secondary | ICD-10-CM | POA: Insufficient documentation

## 2014-09-10 DIAGNOSIS — G609 Hereditary and idiopathic neuropathy, unspecified: Secondary | ICD-10-CM | POA: Diagnosis not present

## 2014-09-10 DIAGNOSIS — Z720 Tobacco use: Secondary | ICD-10-CM | POA: Diagnosis not present

## 2014-09-10 MED ORDER — METOPROLOL TARTRATE 50 MG PO TABS: 25.0000 mg | ORAL_TABLET | Freq: Two times a day (BID) | ORAL | Status: DC

## 2014-09-10 NOTE — Patient Instructions (Signed)
DASH Eating Plan °DASH stands for "Dietary Approaches to Stop Hypertension." The DASH eating plan is a healthy eating plan that has been shown to reduce high blood pressure (hypertension). Additional health benefits may include reducing the risk of type 2 diabetes mellitus, heart disease, and stroke. The DASH eating plan may also help with weight loss. °WHAT DO I NEED TO KNOW ABOUT THE DASH EATING PLAN? °For the DASH eating plan, you will follow these general guidelines: °· Choose foods with a percent daily value for sodium of less than 5% (as listed on the food label). °· Use salt-free seasonings or herbs instead of table salt or sea salt. °· Check with your health care provider or pharmacist before using salt substitutes. °· Eat lower-sodium products, often labeled as "lower sodium" or "no salt added." °· Eat fresh foods. °· Eat more vegetables, fruits, and low-fat dairy products. °· Choose whole grains. Look for the word "whole" as the first word in the ingredient list. °· Choose fish and skinless chicken or turkey more often than red meat. Limit fish, poultry, and meat to 6 oz (170 g) each day. °· Limit sweets, desserts, sugars, and sugary drinks. °· Choose heart-healthy fats. °· Limit cheese to 1 oz (28 g) per day. °· Eat more home-cooked food and less restaurant, buffet, and fast food. °· Limit fried foods. °· Cook foods using methods other than frying. °· Limit canned vegetables. If you do use them, rinse them well to decrease the sodium. °· When eating at a restaurant, ask that your food be prepared with less salt, or no salt if possible. °WHAT FOODS CAN I EAT? °Seek help from a dietitian for individual calorie needs. °Grains °Whole grain or whole wheat bread. Brown rice. Whole grain or whole wheat pasta. Quinoa, bulgur, and whole grain cereals. Low-sodium cereals. Corn or whole wheat flour tortillas. Whole grain cornbread. Whole grain crackers. Low-sodium crackers. °Vegetables °Fresh or frozen vegetables  (raw, steamed, roasted, or grilled). Low-sodium or reduced-sodium tomato and vegetable juices. Low-sodium or reduced-sodium tomato sauce and paste. Low-sodium or reduced-sodium canned vegetables.  °Fruits °All fresh, canned (in natural juice), or frozen fruits. °Meat and Other Protein Products °Ground beef (85% or leaner), grass-fed beef, or beef trimmed of fat. Skinless chicken or turkey. Ground chicken or turkey. Pork trimmed of fat. All fish and seafood. Eggs. Dried beans, peas, or lentils. Unsalted nuts and seeds. Unsalted canned beans. °Dairy °Low-fat dairy products, such as skim or 1% milk, 2% or reduced-fat cheeses, low-fat ricotta or cottage cheese, or plain low-fat yogurt. Low-sodium or reduced-sodium cheeses. °Fats and Oils °Tub margarines without trans fats. Light or reduced-fat mayonnaise and salad dressings (reduced sodium). Avocado. Safflower, olive, or canola oils. Natural peanut or almond butter. °Other °Unsalted popcorn and pretzels. °The items listed above may not be a complete list of recommended foods or beverages. Contact your dietitian for more options. °WHAT FOODS ARE NOT RECOMMENDED? °Grains °White bread. White pasta. White rice. Refined cornbread. Bagels and croissants. Crackers that contain trans fat. °Vegetables °Creamed or fried vegetables. Vegetables in a cheese sauce. Regular canned vegetables. Regular canned tomato sauce and paste. Regular tomato and vegetable juices. °Fruits °Dried fruits. Canned fruit in light or heavy syrup. Fruit juice. °Meat and Other Protein Products °Fatty cuts of meat. Ribs, chicken wings, bacon, sausage, bologna, salami, chitterlings, fatback, hot dogs, bratwurst, and packaged luncheon meats. Salted nuts and seeds. Canned beans with salt. °Dairy °Whole or 2% milk, cream, half-and-half, and cream cheese. Whole-fat or sweetened yogurt. Full-fat   cheeses or blue cheese. Nondairy creamers and whipped toppings. Processed cheese, cheese spreads, or cheese  curds. °Condiments °Onion and garlic salt, seasoned salt, table salt, and sea salt. Canned and packaged gravies. Worcestershire sauce. Tartar sauce. Barbecue sauce. Teriyaki sauce. Soy sauce, including reduced sodium. Steak sauce. Fish sauce. Oyster sauce. Cocktail sauce. Horseradish. Ketchup and mustard. Meat flavorings and tenderizers. Bouillon cubes. Hot sauce. Tabasco sauce. Marinades. Taco seasonings. Relishes. °Fats and Oils °Butter, stick margarine, lard, shortening, ghee, and bacon fat. Coconut, palm kernel, or palm oils. Regular salad dressings. °Other °Pickles and olives. Salted popcorn and pretzels. °The items listed above may not be a complete list of foods and beverages to avoid. Contact your dietitian for more information. °WHERE CAN I FIND MORE INFORMATION? °National Heart, Lung, and Blood Institute: www.nhlbi.nih.gov/health/health-topics/topics/dash/ °Document Released: 02/24/2011 Document Revised: 07/22/2013 Document Reviewed: 01/09/2013 °ExitCare® Patient Information ©2015 [employer]. This information is not intended to replace advice given to you by your health care provider. Make sure you discuss any questions you have with your health care provider. ° °

## 2014-09-10 NOTE — Progress Notes (Signed)
Patient here for follow up on his blood pressure and  Seizures and a basic check up

## 2014-09-10 NOTE — Progress Notes (Signed)
MRN: [id number] Name: [name] [name]  Sex: male Age: 54 y.o. DOB: [birth year]  Allergies: Review of patient's allergies indicates no known allergies.  Chief Complaint  Patient presents with  . Follow-up    HPI: Patient is 53 y.o. male who has to of hypertension, seizure disorder, neuropathy comes today for followup , as per patient he has seen his neurologist recently and was given prescriptions, today's blood pressure is borderline elevated, as per patient he has not taken his blood pressure medication today.patient still smokes cigarettes, I counseled patient to quit smoking.as per patient his neurologist is helping him to get increased hours with home health aid.  Past Medical History  Diagnosis Date  . Seizure   . ETOHism   . Aneurysm   . Hypertension   . Hepatitis C   . Hereditary and idiopathic peripheral neuropathy 06/03/2014    Past Surgical History  Procedure Laterality Date  . Cerebral aneurysm repair      At [facility]  . I&d extremity Left 10/26/2012    Procedure: IRRIGATION AND DEBRIDEMENT Left Elbow;  Surgeon: Sheral Apley, MD;  Location: [facility] OR;  Service: Orthopedics;  Laterality: Left;  . I&d extremity Left 10/29/2012    Procedure: IRRIGATION AND DEBRIDEMENT EXTREMITY, wound vac change, stimulan beads;  Surgeon: Sheral Apley, MD;  Location: [facility] OR;  Service: Orthopedics;  Laterality: Left;  lateral on bean bag  . Hardware removal Left 10/29/2012    Procedure: HARDWARE REMOVAL;  Surgeon: Sheral Apley, MD;  Location: [facility] OR;  Service: Orthopedics;  Laterality: Left;  . Incision and drainage abscess Left 11/02/2012    Procedure: INCISION AND DRAINAGE ABSCESS;  Surgeon: Sheral Apley, MD;  Location: [facility] OR;  Service: Orthopedics;  Laterality: Left;  . I&d extremity Left 11/02/2012    Procedure: IRRIGATION AND DEBRIDEMENT EXTREMITY;  Surgeon: Sheral Apley, MD;  Location: [facility] OR;  Service: Orthopedics;  Laterality: Left;      Medication List      This list is accurate as of: 09/10/14 12:36 PM.  Always use your most recent med list.               carbamazepine 200 MG tablet  Commonly known as:  TEGRETOL  Take 2 tablets (400 mg total) by mouth 3 (three) times daily.     folic acid 1 MG tablet  Commonly known as:  FOLVITE  Take 1 tablet (1 mg total) by mouth daily.     levETIRAcetam 1000 MG tablet  Commonly known as:  KEPPRA  Take 1 tablet (1,000 mg total) by mouth 2 (two) times daily.     metoprolol 50 MG tablet  Commonly known as:  LOPRESSOR  Take 0.5 tablets (25 mg total) by mouth 2 (two) times daily.     pregabalin 50 MG capsule  Commonly known as:  LYRICA  Take 2 tablets in the morning, 1 tablet at noon and 2 tablets at bedtime.     pregabalin 50 MG capsule  Commonly known as:  LYRICA  Take 2 tablets in the morning, 1 tablet at noon and 2 tablets at bedtime.     thiamine 100 MG tablet  Take 1 tablet (100 mg total) by mouth daily.        Meds ordered this encounter  Medications  . metoprolol (LOPRESSOR) 50 MG tablet    Sig: Take 0.5 tablets (25 mg total) by mouth 2 (two) times daily.    Dispense:  60 tablet  Refill:  3     There is no immunization history on file for this patient.  Family History  Problem Relation Age of Onset  . Hypertension Mother   . Cancer Mother   . Hypertension Father   . Diabetes Sister   . Cancer Sister   . Cancer Sister     History  Substance Use Topics  . Smoking status: Former Smoker    Types: Cigarettes  . Smokeless tobacco: Never Used  . Alcohol Use: No     Comment: quit drinking 11/2011 per patient    Review of Systems   As noted in HPI  Filed Vitals:   09/10/14 1212  BP: 140/90  Pulse:   Temp:   Resp:     Physical Exam  Physical Exam  Constitutional: No distress.  Eyes: EOM are normal. Pupils are equal, round, and reactive to light.  Cardiovascular: Normal rate and regular rhythm.   Pulmonary/Chest: Breath sounds normal. No respiratory  distress. He has no wheezes. He has no rales.  Musculoskeletal: He exhibits no edema.    CBC    Component Value Date/Time   WBC 6.2 04/08/2014 1556   WBC 6.2 04/08/2014 1556   WBC 9.9 01/24/2014 0500   RBC 4.50 04/08/2014 1556   RBC 4.50 04/08/2014 1556   RBC 3.55* 01/24/2014 0500   HGB 15.8 04/08/2014 1556   HGB 15.8 04/08/2014 1556   HCT 45.2 04/08/2014 1556   HCT 45.2 04/08/2014 1556   PLT 174 04/08/2014 1556   PLT 174 04/08/2014 1556   MCV 100* 04/08/2014 1556   MCV 100* 04/08/2014 1556   LYMPHSABS 2.5 04/08/2014 1556   LYMPHSABS 2.5 04/08/2014 1556   LYMPHSABS 3.3 01/24/2014 0500   MONOABS 0.9 01/24/2014 0500   EOSABS 0.0 04/08/2014 1556   EOSABS 0.0 04/08/2014 1556   EOSABS 0.1 01/24/2014 0500   BASOSABS 0.0 04/08/2014 1556   BASOSABS 0.0 04/08/2014 1556   BASOSABS 0.0 01/24/2014 0500    CMP     Component Value Date/Time   NA 131* 04/08/2014 1556   NA 139 03/10/2014 1504   K 4.6 04/08/2014 1556   CL 92* 04/08/2014 1556   CO2 22 04/08/2014 1556   GLUCOSE 101* 04/08/2014 1556   GLUCOSE 98 03/10/2014 1504   BUN 6 04/08/2014 1556   BUN 12 03/10/2014 1504   CREATININE 0.73* 04/08/2014 1556   CREATININE 0.77 03/10/2014 1504   CALCIUM 9.2 04/08/2014 1556   PROT 8.1 06/03/2014 1419   PROT 7.8 03/10/2014 1504   ALBUMIN 4.2 03/10/2014 1504   AST 53* 04/08/2014 1556   ALT 51* 04/08/2014 1556   ALKPHOS 108 04/08/2014 1556   BILITOT 0.5 04/08/2014 1556   GFRNONAA 104 04/08/2014 1556   GFRNONAA >89 03/10/2014 1504   GFRAA 121 04/08/2014 1556   GFRAA >89 03/10/2014 1504    No results found for: CHOL  Lab Results  Component Value Date/Time   HGBA1C 5.7* 01/24/2014 05:00 AM    Lab Results  Component Value Date/Time   AST 53* 04/08/2014 03:56 PM    Assessment and Plan  Essential hypertension, benign - Plan: advised patient for DASH diet, continue with metoprolol (LOPRESSOR) 50 MG tablet  Convulsions, unspecified convulsion type Currently following up  with the neurologist And is on Keppra, Tegretol, Lyrica.  Smoking Again counseled patient to quit smoking.   Return in about 3 months (around 12/11/2014), or if symptoms worsen or fail to improve.   This note has been created with [employer]  Geophysicist/field seismologist. Any transcriptional errors are unintentional.    Lorayne Marek, MD

## 2014-09-10 NOTE — Telephone Encounter (Signed)
Form for home health completed, signed and faxed to [facility]. Confirmation received.

## 2014-09-23 ENCOUNTER — Telehealth: Payer: Self-pay | Admitting: Neurology

## 2014-09-23 NOTE — Telephone Encounter (Signed)
Mal[facility] with [facility] called stating patient is insistent he get pregabalin (LYRICA) 50 MG capsule today as he is in a lot of pain. Next refill due 10/04/14. She states it can be filled as early as 09/30/14. Please call and advise. She can be reached at [phone].

## 2014-09-23 NOTE — Telephone Encounter (Signed)
I called the patient. I left a message. Our records indicate that a Rx was sent in 09/03/14 with multiple refills. Patient should have enough Lyrica for 6 months. He is to call back if there are other issues.
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
## 2014-09-24 NOTE — Telephone Encounter (Signed)
Patient returned call and requested to speak with Dr. Anne HahnWillis, he states that he is out of the medication. He says that it is very urgent because he needs the medication refilled today. Please call and advise.

## 2014-09-26 NOTE — Telephone Encounter (Signed)
I called Kim to follow up on the home health aid for the patient. She stated they are working on it but the wording on the order made it hard for the assistance to be approved because it sounds like he just needs it for medications. I told her I would be happy to revise it if they would send the form back. She is going to have the form sent back to our office.

## 2014-10-06 NOTE — Telephone Encounter (Signed)
I called Kim to follow up on the order. I never received the order to revise. She stated they must not need it right now because the nurse just came out to the home today to assess the patient and they are just waiting on the insurance company to say how many days/hours they will cover for the patient to have assistance in the home.

## 2014-10-08 ENCOUNTER — Emergency Department (HOSPITAL_COMMUNITY): Payer: Medicaid Other

## 2014-10-08 ENCOUNTER — Inpatient Hospital Stay (HOSPITAL_COMMUNITY)
Admission: EM | Admit: 2014-10-08 | Discharge: 2014-10-20 | DRG: 071 | Discharge status: Against Medical Advice | Payer: Medicaid Other | Attending: Internal Medicine | Admitting: Internal Medicine

## 2014-10-08 ENCOUNTER — Encounter (HOSPITAL_COMMUNITY): Payer: Self-pay | Admitting: Emergency Medicine

## 2014-10-08 DIAGNOSIS — B182 Chronic viral hepatitis C: Secondary | ICD-10-CM | POA: Diagnosis present

## 2014-10-08 DIAGNOSIS — J441 Chronic obstructive pulmonary disease with (acute) exacerbation: Secondary | ICD-10-CM | POA: Diagnosis present

## 2014-10-08 DIAGNOSIS — R059 Cough, unspecified: Secondary | ICD-10-CM

## 2014-10-08 DIAGNOSIS — G9341 Metabolic encephalopathy: Secondary | ICD-10-CM | POA: Diagnosis present

## 2014-10-08 DIAGNOSIS — G40909 Epilepsy, unspecified, not intractable, without status epilepticus: Secondary | ICD-10-CM | POA: Diagnosis present

## 2014-10-08 DIAGNOSIS — Z79899 Other long term (current) drug therapy: Secondary | ICD-10-CM | POA: Diagnosis not present

## 2014-10-08 DIAGNOSIS — E871 Hypo-osmolality and hyponatremia: Secondary | ICD-10-CM | POA: Diagnosis present

## 2014-10-08 DIAGNOSIS — K7460 Unspecified cirrhosis of liver: Secondary | ICD-10-CM | POA: Diagnosis not present

## 2014-10-08 DIAGNOSIS — K769 Liver disease, unspecified: Secondary | ICD-10-CM | POA: Diagnosis not present

## 2014-10-08 DIAGNOSIS — K7290 Hepatic failure, unspecified without coma: Secondary | ICD-10-CM | POA: Diagnosis present

## 2014-10-08 DIAGNOSIS — J9811 Atelectasis: Secondary | ICD-10-CM | POA: Diagnosis not present

## 2014-10-08 DIAGNOSIS — Z9119 Patient's noncompliance with other medical treatment and regimen: Secondary | ICD-10-CM | POA: Diagnosis present

## 2014-10-08 DIAGNOSIS — R509 Fever, unspecified: Secondary | ICD-10-CM | POA: Diagnosis not present

## 2014-10-08 DIAGNOSIS — M549 Dorsalgia, unspecified: Secondary | ICD-10-CM | POA: Diagnosis present

## 2014-10-08 DIAGNOSIS — Z9114 Patient's other noncompliance with medication regimen: Secondary | ICD-10-CM | POA: Diagnosis present

## 2014-10-08 DIAGNOSIS — M25522 Pain in left elbow: Secondary | ICD-10-CM | POA: Diagnosis present

## 2014-10-08 DIAGNOSIS — S52202D Unspecified fracture of shaft of left ulna, subsequent encounter for closed fracture with routine healing: Secondary | ICD-10-CM | POA: Diagnosis not present

## 2014-10-08 DIAGNOSIS — T421X6A Underdosing of iminostilbenes, initial encounter: Secondary | ICD-10-CM | POA: Diagnosis present

## 2014-10-08 DIAGNOSIS — T421X1S Poisoning by iminostilbenes, accidental (unintentional), sequela: Secondary | ICD-10-CM | POA: Diagnosis not present

## 2014-10-08 DIAGNOSIS — S2243XA Multiple fractures of ribs, bilateral, initial encounter for closed fracture: Secondary | ICD-10-CM | POA: Diagnosis present

## 2014-10-08 DIAGNOSIS — Z8679 Personal history of other diseases of the circulatory system: Secondary | ICD-10-CM | POA: Diagnosis not present

## 2014-10-08 DIAGNOSIS — R7881 Bacteremia: Secondary | ICD-10-CM | POA: Diagnosis not present

## 2014-10-08 DIAGNOSIS — S52202A Unspecified fracture of shaft of left ulna, initial encounter for closed fracture: Secondary | ICD-10-CM | POA: Diagnosis present

## 2014-10-08 DIAGNOSIS — I10 Essential (primary) hypertension: Secondary | ICD-10-CM | POA: Diagnosis present

## 2014-10-08 DIAGNOSIS — R454 Irritability and anger: Secondary | ICD-10-CM | POA: Diagnosis not present

## 2014-10-08 DIAGNOSIS — R569 Unspecified convulsions: Secondary | ICD-10-CM | POA: Diagnosis not present

## 2014-10-08 DIAGNOSIS — B9562 Methicillin resistant Staphylococcus aureus infection as the cause of diseases classified elsewhere: Secondary | ICD-10-CM | POA: Diagnosis not present

## 2014-10-08 DIAGNOSIS — G609 Hereditary and idiopathic neuropathy, unspecified: Secondary | ICD-10-CM | POA: Diagnosis present

## 2014-10-08 DIAGNOSIS — R296 Repeated falls: Secondary | ICD-10-CM | POA: Diagnosis present

## 2014-10-08 DIAGNOSIS — Z809 Family history of malignant neoplasm, unspecified: Secondary | ICD-10-CM

## 2014-10-08 DIAGNOSIS — N390 Urinary tract infection, site not specified: Secondary | ICD-10-CM | POA: Diagnosis present

## 2014-10-08 DIAGNOSIS — Z8249 Family history of ischemic heart disease and other diseases of the circulatory system: Secondary | ICD-10-CM | POA: Diagnosis not present

## 2014-10-08 DIAGNOSIS — Y92009 Unspecified place in unspecified non-institutional (private) residence as the place of occurrence of the external cause: Secondary | ICD-10-CM

## 2014-10-08 DIAGNOSIS — Z87891 Personal history of nicotine dependence: Secondary | ICD-10-CM

## 2014-10-08 DIAGNOSIS — R4182 Altered mental status, unspecified: Secondary | ICD-10-CM | POA: Diagnosis not present

## 2014-10-08 DIAGNOSIS — T421X1D Poisoning by iminostilbenes, accidental (unintentional), subsequent encounter: Secondary | ICD-10-CM | POA: Diagnosis not present

## 2014-10-08 DIAGNOSIS — R7989 Other specified abnormal findings of blood chemistry: Secondary | ICD-10-CM | POA: Diagnosis present

## 2014-10-08 DIAGNOSIS — J209 Acute bronchitis, unspecified: Secondary | ICD-10-CM | POA: Diagnosis present

## 2014-10-08 DIAGNOSIS — Z8673 Personal history of transient ischemic attack (TIA), and cerebral infarction without residual deficits: Secondary | ICD-10-CM

## 2014-10-08 DIAGNOSIS — Z8781 Personal history of (healed) traumatic fracture: Secondary | ICD-10-CM

## 2014-10-08 DIAGNOSIS — S52202K Unspecified fracture of shaft of left ulna, subsequent encounter for closed fracture with nonunion: Secondary | ICD-10-CM | POA: Diagnosis present

## 2014-10-08 DIAGNOSIS — R52 Pain, unspecified: Secondary | ICD-10-CM

## 2014-10-08 DIAGNOSIS — K56609 Unspecified intestinal obstruction, unspecified as to partial versus complete obstruction: Secondary | ICD-10-CM

## 2014-10-08 DIAGNOSIS — S52201F Unspecified fracture of shaft of right ulna, subsequent encounter for open fracture type IIIA, IIIB, or IIIC with routine healing: Secondary | ICD-10-CM | POA: Diagnosis not present

## 2014-10-08 DIAGNOSIS — M25529 Pain in unspecified elbow: Secondary | ICD-10-CM | POA: Diagnosis present

## 2014-10-08 DIAGNOSIS — R410 Disorientation, unspecified: Secondary | ICD-10-CM | POA: Diagnosis not present

## 2014-10-08 DIAGNOSIS — W19XXXA Unspecified fall, initial encounter: Secondary | ICD-10-CM | POA: Diagnosis present

## 2014-10-08 DIAGNOSIS — K7682 Hepatic encephalopathy: Secondary | ICD-10-CM | POA: Diagnosis present

## 2014-10-08 DIAGNOSIS — Z833 Family history of diabetes mellitus: Secondary | ICD-10-CM

## 2014-10-08 DIAGNOSIS — T421X5A Adverse effect of iminostilbenes, initial encounter: Secondary | ICD-10-CM | POA: Diagnosis present

## 2014-10-08 DIAGNOSIS — S52209A Unspecified fracture of shaft of unspecified ulna, initial encounter for closed fracture: Secondary | ICD-10-CM | POA: Diagnosis present

## 2014-10-08 DIAGNOSIS — Z9889 Other specified postprocedural states: Secondary | ICD-10-CM

## 2014-10-08 DIAGNOSIS — A4901 Methicillin susceptible Staphylococcus aureus infection, unspecified site: Secondary | ICD-10-CM | POA: Diagnosis not present

## 2014-10-08 DIAGNOSIS — F17200 Nicotine dependence, unspecified, uncomplicated: Secondary | ICD-10-CM | POA: Diagnosis present

## 2014-10-08 DIAGNOSIS — T421X1A Poisoning by iminostilbenes, accidental (unintentional), initial encounter: Secondary | ICD-10-CM | POA: Diagnosis present

## 2014-10-08 DIAGNOSIS — R071 Chest pain on breathing: Secondary | ICD-10-CM

## 2014-10-08 DIAGNOSIS — B9561 Methicillin susceptible Staphylococcus aureus infection as the cause of diseases classified elsewhere: Secondary | ICD-10-CM | POA: Diagnosis present

## 2014-10-08 DIAGNOSIS — R05 Cough: Secondary | ICD-10-CM

## 2014-10-08 LAB — URINALYSIS, ROUTINE W REFLEX MICROSCOPIC
BILIRUBIN URINE: NEGATIVE
Glucose, UA: NEGATIVE mg/dL
Hgb urine dipstick: NEGATIVE
KETONES UR: NEGATIVE mg/dL
Leukocytes, UA: NEGATIVE
Nitrite: NEGATIVE
PROTEIN: NEGATIVE mg/dL
Specific Gravity, Urine: 1.024 (ref 1.005–1.030)
UROBILINOGEN UA: 1 mg/dL (ref 0.0–1.0)
pH: 6 (ref 5.0–8.0)

## 2014-10-08 LAB — CBC WITH DIFFERENTIAL/PLATELET
BASOS ABS: 0 10*3/uL (ref 0.0–0.1)
BASOS PCT: 0 % (ref 0–1)
Eosinophils Absolute: 0 10*3/uL (ref 0.0–0.7)
Eosinophils Relative: 1 % (ref 0–5)
HCT: 38.2 % — ABNORMAL LOW (ref 39.0–52.0)
Hemoglobin: 13.5 g/dL (ref 13.0–17.0)
LYMPHS ABS: 2.4 10*3/uL (ref 0.7–4.0)
LYMPHS PCT: 39 % (ref 12–46)
MCH: 34.7 pg — AB (ref 26.0–34.0)
MCHC: 35.3 g/dL (ref 30.0–36.0)
MCV: 98.2 fL (ref 78.0–100.0)
MONOS PCT: 12 % (ref 3–12)
Monocytes Absolute: 0.8 10*3/uL (ref 0.1–1.0)
NEUTROS ABS: 3.1 10*3/uL (ref 1.7–7.7)
NEUTROS PCT: 48 % (ref 43–77)
Platelets: 148 10*3/uL — ABNORMAL LOW (ref 150–400)
RBC: 3.89 MIL/uL — ABNORMAL LOW (ref 4.22–5.81)
RDW: 12 % (ref 11.5–15.5)
WBC: 6.3 10*3/uL (ref 4.0–10.5)

## 2014-10-08 LAB — COMPREHENSIVE METABOLIC PANEL
ALT: 21 U/L (ref 17–63)
AST: 29 U/L (ref 15–41)
Albumin: 3.8 g/dL (ref 3.5–5.0)
Alkaline Phosphatase: 70 U/L (ref 38–126)
Anion gap: 6 (ref 5–15)
BUN: 12 mg/dL (ref 6–20)
CO2: 26 mmol/L (ref 22–32)
Calcium: 8.2 mg/dL — ABNORMAL LOW (ref 8.9–10.3)
Chloride: 99 mmol/L — ABNORMAL LOW (ref 101–111)
Creatinine, Ser: 0.75 mg/dL (ref 0.61–1.24)
GFR calc Af Amer: >60 mL/min (ref >60)
GFR calc non Af Amer: >60 mL/min (ref >60)
Glucose, Bld: 102 mg/dL — ABNORMAL HIGH (ref 65–99)
Potassium: 4.1 mmol/L (ref 3.5–5.1)
Sodium: 131 mmol/L — ABNORMAL LOW (ref 135–145)
Total Bilirubin: 0.6 mg/dL (ref 0.3–1.2)
Total Protein: 7.4 g/dL (ref 6.5–8.1)

## 2014-10-08 LAB — RAPID URINE DRUG SCREEN, HOSP PERFORMED
Amphetamines: NOT DETECTED
Barbiturates: NOT DETECTED
Benzodiazepines: NOT DETECTED
Cocaine: NOT DETECTED
OPIATES: NOT DETECTED
TETRAHYDROCANNABINOL: NOT DETECTED

## 2014-10-08 LAB — CBG MONITORING, ED
Glucose-Capillary: 108 mg/dL — ABNORMAL HIGH (ref 65–99)
Glucose-Capillary: 108 mg/dL — ABNORMAL HIGH (ref 65–99)

## 2014-10-08 LAB — I-STAT TROPONIN, ED: TROPONIN I, POC: 0 ng/mL (ref 0.00–0.08)

## 2014-10-08 LAB — ETHANOL: Alcohol, Ethyl (B): <5 mg/dL (ref <5)

## 2014-10-08 LAB — AMMONIA: Ammonia: 26 umol/L (ref 9–35)

## 2014-10-08 LAB — MRSA PCR SCREENING: MRSA BY PCR: NEGATIVE

## 2014-10-08 LAB — CARBAMAZEPINE LEVEL, TOTAL: Carbamazepine Lvl: 20 ug/mL (ref 4.0–12.0)

## 2014-10-08 MED ORDER — ENOXAPARIN SODIUM 40 MG/0.4ML ~~LOC~~ SOLN
40.0000 mg | SUBCUTANEOUS | Status: DC
  Administered 2014-10-08 – 2014-10-09 (×2): 40 mg via SUBCUTANEOUS
  Filled 2014-10-08 (×3): qty 0.4

## 2014-10-08 MED ORDER — LORAZEPAM 2 MG/ML IJ SOLN
1.0000 mg | INTRAMUSCULAR | Status: DC | PRN
  Administered 2014-10-08 – 2014-10-13 (×7): 1 mg via INTRAVENOUS
  Filled 2014-10-08 (×8): qty 1

## 2014-10-08 MED ORDER — LEVOFLOXACIN IN D5W 750 MG/150ML IV SOLN
750.0000 mg | Freq: Once | INTRAVENOUS | Status: AC
  Administered 2014-10-08: 750 mg via INTRAVENOUS
  Filled 2014-10-08: qty 150

## 2014-10-08 MED ORDER — KCL IN DEXTROSE-NACL 20-5-0.9 MEQ/L-%-% IV SOLN
INTRAVENOUS | Status: DC
  Administered 2014-10-08 – 2014-10-09 (×2): via INTRAVENOUS
  Filled 2014-10-08 (×5): qty 1000

## 2014-10-08 MED ORDER — LEVOFLOXACIN IN D5W 750 MG/150ML IV SOLN
750.0000 mg | INTRAVENOUS | Status: DC
  Administered 2014-10-09: 750 mg via INTRAVENOUS
  Filled 2014-10-08 (×2): qty 150

## 2014-10-08 MED ORDER — ALUM & MAG HYDROXIDE-SIMETH 200-200-20 MG/5ML PO SUSP
30.0000 mL | Freq: Four times a day (QID) | ORAL | Status: DC | PRN
Start: 2014-10-08 — End: 2014-10-20
  Administered 2014-10-10: 30 mL via ORAL
  Filled 2014-10-08: qty 30

## 2014-10-08 MED ORDER — FOLIC ACID 1 MG PO TABS
1.0000 mg | ORAL_TABLET | Freq: Every day | ORAL | Status: DC
  Administered 2014-10-08 – 2014-10-20 (×13): 1 mg via ORAL
  Filled 2014-10-08 (×13): qty 1

## 2014-10-08 MED ORDER — PREGABALIN 75 MG PO CAPS
75.0000 mg | ORAL_CAPSULE | Freq: Two times a day (BID) | ORAL | Status: DC
  Administered 2014-10-09 – 2014-10-20 (×23): 75 mg via ORAL
  Filled 2014-10-08 (×23): qty 1

## 2014-10-08 MED ORDER — ONDANSETRON HCL 4 MG/2ML IJ SOLN
4.0000 mg | Freq: Four times a day (QID) | INTRAMUSCULAR | Status: DC | PRN
  Administered 2014-10-09 – 2014-10-10 (×2): 4 mg via INTRAVENOUS
  Filled 2014-10-08 (×2): qty 2

## 2014-10-08 MED ORDER — PANTOPRAZOLE SODIUM 40 MG PO TBEC
40.0000 mg | DELAYED_RELEASE_TABLET | Freq: Every day | ORAL | Status: DC
  Administered 2014-10-09 – 2014-10-20 (×12): 40 mg via ORAL
  Filled 2014-10-08 (×12): qty 1

## 2014-10-08 MED ORDER — NICOTINE 21 MG/24HR TD PT24
21.0000 mg | MEDICATED_PATCH | Freq: Every day | TRANSDERMAL | Status: DC
  Administered 2014-10-08 – 2014-10-20 (×13): 21 mg via TRANSDERMAL
  Filled 2014-10-08 (×16): qty 1

## 2014-10-08 MED ORDER — VITAMIN B-1 100 MG PO TABS
100.0000 mg | ORAL_TABLET | Freq: Every day | ORAL | Status: DC
  Administered 2014-10-08 – 2014-10-20 (×13): 100 mg via ORAL
  Filled 2014-10-08 (×13): qty 1

## 2014-10-08 MED ORDER — PNEUMOCOCCAL VAC POLYVALENT 25 MCG/0.5ML IJ INJ
0.5000 mL | INJECTION | INTRAMUSCULAR | Status: DC
  Filled 2014-10-08 (×2): qty 0.5

## 2014-10-08 MED ORDER — ALBUTEROL SULFATE (2.5 MG/3ML) 0.083% IN NEBU: 2.5000 mg | INHALATION_SOLUTION | RESPIRATORY_TRACT | Status: DC | PRN

## 2014-10-08 MED ORDER — ONDANSETRON HCL 4 MG PO TABS: 4.0000 mg | ORAL_TABLET | Freq: Four times a day (QID) | ORAL | Status: DC | PRN

## 2014-10-08 MED ORDER — SODIUM CHLORIDE 0.9 % IV BOLUS (SEPSIS)
1000.0000 mL | Freq: Once | INTRAVENOUS | Status: AC
  Administered 2014-10-08: 1000 mL via INTRAVENOUS

## 2014-10-08 MED ORDER — LEVETIRACETAM 500 MG PO TABS
1000.0000 mg | ORAL_TABLET | Freq: Two times a day (BID) | ORAL | Status: DC
  Administered 2014-10-08 – 2014-10-18 (×21): 1000 mg via ORAL
  Filled 2014-10-08 (×23): qty 2

## 2014-10-08 MED ORDER — METOPROLOL TARTRATE 25 MG PO TABS
25.0000 mg | ORAL_TABLET | Freq: Two times a day (BID) | ORAL | Status: DC
  Administered 2014-10-08 – 2014-10-20 (×24): 25 mg via ORAL
  Filled 2014-10-08 (×27): qty 1

## 2014-10-08 MED ORDER — POLYETHYLENE GLYCOL 3350 17 G PO PACK
17.0000 g | PACK | Freq: Every day | ORAL | Status: DC | PRN
  Administered 2014-10-12: 17 g via ORAL
  Filled 2014-10-08 (×2): qty 1

## 2014-10-08 MED ORDER — LORAZEPAM 2 MG/ML IJ SOLN
1.0000 mg | Freq: Once | INTRAMUSCULAR | Status: AC
  Administered 2014-10-08: 1 mg via INTRAVENOUS
  Filled 2014-10-08: qty 1

## 2014-10-08 NOTE — ED Notes (Signed)
EDPA aware unable to perform in and out 2/2 pt uncooperative.  NNO.

## 2014-10-08 NOTE — ED Notes (Signed)
Pt remains confused, continually pulling lines off.  Pt able to be redirected for short time periods at this time.

## 2014-10-08 NOTE — ED Notes (Signed)
Pt to radiology.

## 2014-10-08 NOTE — ED Notes (Signed)
Patient attempted to urinate in urinal, was unsuccessful. Will try later.

## 2014-10-08 NOTE — ED Notes (Signed)
CMP redrawn and sent to lab. 

## 2014-10-08 NOTE — ED Provider Notes (Signed)
CSN: [id number]     Arrival date & time 10/08/14  1015 History   First MD Initiated Contact with Patient 10/08/14 1105     Chief Complaint  Patient presents with  . Back Pain    R sided axilla/back pain s/p fall with seizure yesterday  . Seizures    pt reports sz yesterday and this AM, unwitnessed     (Consider location/radiation/quality/duration/timing/severity/associated sxs/prior Treatment) Patient is a 54 y.o. male presenting with back pain and seizures. The history is provided by the patient. No language interpreter was used.  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 He also had a seizure this morning which was unwitnessed. EMS states that he was alert and oriented but after giving fentanyl he appears drowsy and is just moaning. He is unable to give me a history but is pointing to the right ribs.  Past Medical History  Diagnosis Date  . Seizure   . ETOHism   . Aneurysm   . Hypertension   . Hepatitis C   . Hereditary and idiopathic peripheral neuropathy 06/03/2014   Past Surgical History  Procedure Laterality Date  . Cerebral aneurysm repair      [facility]  . I&d extremity Left 10/26/2012    Procedure: IRRIGATION AND DEBRIDEMENT Left Elbow;  Surgeon: Sheral Apleyimothy D Murphy, MD;  [facility];  Service: Orthopedics;  Laterality: Left;  . I&d extremity Left 10/29/2012    Procedure: IRRIGATION AND DEBRIDEMENT EXTREMITY, wound vac change, stimulan beads;  Surgeon: Sheral Apleyimothy D Murphy, MD;  Location: [facility] OR;  Service: Orthopedics;  Laterality: Left;  lateral on bean bag  . Hardware removal Left 10/29/2012    Procedure: HARDWARE REMOVAL;  Surgeon: Sheral Apleyimothy D Murphy, MD;  [facility];  Service: Orthopedics;  Laterality: Left;  . Incision and drainage abscess Left 11/02/2012     Procedure: INCISION AND DRAINAGE ABSCESS;  Surgeon: Sheral Apleyimothy D Murphy, MD;  Location: [facility] OR;  Service: Orthopedics;  Laterality: Left;  . I&d extremity Left 11/02/2012    Procedure: IRRIGATION AND DEBRIDEMENT EXTREMITY;  Surgeon: Sheral Apleyimothy D Murphy, MD;  Location: [facility] OR;  Service: Orthopedics;  Laterality: Left;   Family History  Problem Relation Age of Onset  . Hypertension Mother   . Cancer Mother   . Hypertension Father   . Diabetes Sister   . Cancer Sister   . Cancer Sister    History  Substance Use Topics  . Smoking status: Former Smoker    Types: Cigarettes  . Smokeless tobacco: Never Used  . Alcohol Use: No     Comment: quit drinking 11/2011 per patient    Review of Systems  Unable to perform ROS: Mental status change  Musculoskeletal: Positive for back pain.  Neurological: Positive for seizures.      Allergies  Review of patient's allergies indicates no known allergies.  Home Medications   Prior to Admission medications   Medication Sig Start Date End Date Taking? Authorizing Provider  carbamazepine (TEGRETOL) 200 MG tablet Take 2 tablets (400 mg total) by mouth 3 (three) times daily. 09/03/14  Yes York Spanielharles K [name], MD  folic acid (FOLVITE) 1 MG tablet Take 1 tablet (1 mg total) by mouth daily. 08/15/14  Yes Doris Cheadleeepak Advani, MD  levETIRAcetam (KEPPRA) 1000 MG tablet Take 1 tablet (1,000 mg total) by mouth 2 (  two) times daily. 09/03/14  Yes York Spaniel, MD  metoprolol (LOPRESSOR) 50 MG tablet Take 0.5 tablets (25 mg total) by mouth 2 (two) times daily. 09/10/14  Yes [name], MD  pregabalin (LYRICA) 50 MG capsule Take 2 tablets in the morning, 1 tablet at noon and 2 tablets at bedtime. 09/03/14  Yes York Spaniel, MD  pregabalin (LYRICA) 50 MG capsule Take 2 tablets in the morning, 1 tablet at noon and 2 tablets at bedtime. Patient not taking: Reported on 10/08/2014 09/03/14   York Spaniel, MD  thiamine 100 MG tablet Take 1 tablet (100 mg total) by mouth  daily. Patient not taking: Reported on 10/08/2014 03/25/14   [name], MD   BP 146/98 mmHg  Pulse 64  Temp(Src) 98.2 F (36.8 C) (Oral)  Resp 16  SpO2 97% Physical Exam  Constitutional: He appears well-developed. He appears lethargic. He is easily aroused. He does not have a sickly appearance. He does not appear ill.  Thin and unkempt.  Cardiovascular: Normal rate, regular rhythm and normal heart sounds.   Pulmonary/Chest: Effort normal and breath sounds normal. No accessory muscle usage. No respiratory distress. He has no wheezes. He exhibits tenderness.  Right chest wall tenderness. No deformity. No flail chest.  Musculoskeletal: Normal range of motion.  Neurological: He is easily aroused. He appears lethargic.  Arousable but moaning. Incoherent. No signs of head injury.  Skin: Skin is warm.  Nursing note and vitals reviewed.   ED Course  Procedures (including critical care time) Labs Review Labs Reviewed  CARBAMAZEPINE LEVEL, TOTAL - Abnormal; Notable for the following:    Carbamazepine Lvl 20.0 (*)    All other components within normal limits  CBC WITH DIFFERENTIAL/PLATELET - Abnormal; Notable for the following:    RBC 3.89 (*)    HCT 38.2 (*)    MCH 34.7 (*)    Platelets 148 (*)    All other components within normal limits  COMPREHENSIVE METABOLIC PANEL - Abnormal; Notable for the following:    Sodium 131 (*)    Chloride 99 (*)    Glucose, Bld 102 (*)    Calcium 8.2 (*)    All other components within normal limits  CBG MONITORING, ED - Abnormal; Notable for the following:    Glucose-Capillary 108 (*)    All other components within normal limits  CBG MONITORING, ED - Abnormal; Notable for the following:    Glucose-Capillary 108 (*)    All other components within normal limits  ETHANOL  URINE RAPID DRUG SCREEN, HOSP PERFORMED  AMMONIA  I-STAT TROPOININ, ED    Imaging Review Dg Ribs Unilateral W/chest Right  10/08/2014   CLINICAL DATA:  54 year old male with  seizure yesterday morning and fall. Pain radiating to the right axilla and spine. Initial encounter.  EXAM: RIGHT RIBS AND CHEST - 3+ VIEW  COMPARISON:  Chest and left rib series 6 05/2014.  FINDINGS: Semi upright AP view of the chest. Low lung volumes with diffuse crowding of markings. Stable cardiomegaly and mediastinal contours. No pneumothorax, pleural effusion, or consolidation identified. Small calcified granuloma in the right mid lung is stable.  Chronic right posterior eighth rib fracture with callus. Chronic appearing right lateral fourth rib fracture. No acute displaced right rib fracture identified. Grossly intact visualized spinal vertebrae. No acute osseous abnormality identified.  IMPRESSION: 1. 1. No acute displaced right rib fracture identified. Chronic right fourth and eighth rib fractures. 2. Low lung volumes, otherwise no acute cardiopulmonary abnormality.  Electronically Signed   By: Odessa Fleming M.D.   On: 10/08/2014 12:11   Ct Head Wo Contrast  10/08/2014   CLINICAL DATA:  Unwitnessed seizure yesterday and this morning. Fall with seizure yesterday. ETOH, hepatitis-C, hypertension.  EXAM: CT HEAD WITHOUT CONTRAST  TECHNIQUE: Contiguous axial images were obtained from the base of the skull through the vertex without intravenous contrast.  COMPARISON:  Head CT dated 08/22/2014.  FINDINGS: Again noted is mild generalized brain atrophy with commensurate dilatation of the ventricles and sulci. Also again noted is bifrontal encephalomalacia, left greater than right, suggestive of a previous traumatic brain injury. Minimal chronic small vessel ischemic change noted within the deep periventricular white matter and there is a small old lacunar infarct within the right basal ganglia region.  There is no mass, hemorrhage, edema, or other evidence of acute parenchymal abnormality. No extra-axial hemorrhage. No acute osseous fracture or dislocation. Old displaced nasal bone fractures are stable in alignment.   IMPRESSION: Atrophy and chronic ischemic/traumatic changes as detailed above.  No evidence of acute intracranial abnormality. No intracranial mass, hemorrhage, or edema. No fracture.   Electronically Signed   By: Bary Richard M.D.   On: 10/08/2014 13:35     EKG Interpretation None      MDM   Final diagnoses:  Altered mental status, unspecified altered mental status type  Seizure  Patient presents for possible seizure last night and this morning. He is normally A&O4 but presents with AMS. Vitals are stable.  I did a broad workup to look for signs of infection that may be causing his AMS. His tegretol is supra therapeutic. CT head is negative for acute intracranial or traumatic changes. The chest x-ray of the right ribs show no acute fracture and no evidence of pneumonia. A complete x-ray was ordered post finding out that the patient was not demented at baseline but could not be done due to the patient's noncompliance and thrashing. This was the case for urine as well. Ativan was ordered. I spoke to Dr. Venetia Constable who suggested an ammonia level as well as urine and chest xray.  I explained that due to the patients condition it would be hard to obtain.  15:35 Patient has become more lucid but still not at baseline. Patient has pulled out IV. I ordered Levaquin to cover for both possible pneumonia or UTI.      Catha Gosselin, PA-C 10/08/14 1602  Raeford Razor, MD 10/12/14 7829  Raeford Razor, MD 10/21/14 [phone]

## 2014-10-08 NOTE — ED Notes (Signed)
Unable to collect labs at this time patient on his way to xray.

## 2014-10-08 NOTE — ED Notes (Signed)
Patient's [name] [name]

## 2014-10-08 NOTE — ED Notes (Signed)
Pt to CT

## 2014-10-08 NOTE — H&P (Signed)
Patient Demographics  [name] [name], is a 54 y.o. 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 He had CT brain without contrast which showed "Atrophy and chronic ischemic/traumatic changes as detailed above.  No evidence of acute intracranial abnormality. No intracranial mass, hemorrhage, or edema. No fracture", Rib Xray which showed "1. No acute displaced right rib fracture identified. Chronic right fourth and eighth rib fractures. 2. Low lung volumes, otherwise no acute cardiopulmonary abnormality". Labs are significant for normal white count, Tegretol level 20, but UA/urine culture is pending. Apparently, patient has been sick for the last couple of days and has been coughing, there has been taking his medications as usual so it appears that he likely developed an acute infection leading to encephalopathy. He will therefore be admitted to the stepdown unit for further workup including UA/urine culture/ammonia level. Will hold Tegretol, give empiric antibodies, incentive spirometry, oxygen supplementation and nicotine patch. Will continue to optimize they clinical history as patient's sensorium improves. The history was limited as there were no family members to corroborate patient's story- which is unreliable as he remains confused yet makes an effort to answer questions. Plan Encephalopathy,  metabolic/Tegretol toxicity/COPD with acute exacerbation/S/P cerebral aneurysm repair/Seizures/Tobacco dependency/History of rib fracture  Admit to stepdown unit  Hold Tegretol. Ativan as needed for agitation  UA/urine culture/monitor Tegretol level/ammonia level  Incentive spirometry/nicotine patch/Levaquin/albuterol/oxygen supplementation as needed  Physical therapy evaluation when patient more with it Chronic liver disease/Chronic hepatitis C without hepatic coma  Check ammonia level is above  Thiamine/folate Essential hypertension, benign  Resume home medications DVT/GI Prophylaxis  Lovenox  PPI Family Communication: Admission, patients condition and plan of care including tests being ordered have been discussed with the patient who indicates understanding and agree with the plan and Code Status. Code Status   Full Code Likely DC  Home eventually Condition GUARDED   Time spent in minutes : 55  Chief Complaint Chief Complaint  Patient presents with  . Back Pain    R sided axilla/back pain s/p fall with seizure yesterday  . Seizures    pt reports sz yesterday and this AM, unwitnessed     HPI [name] [name]  is a 54 y.o. male, who comes in with complains of multiple falls, back pain and chest discomfort which started yesterday. Apparently, patient reported seizures yesterday. He tells me he has been coughing and feeling weak. Has been taking his medications as usual. Denies any nausea, vomiting, diarrhea or dysuria. He denies fever or chills.   Review of Systems   As in the HPI above.   Past Medical History  Diagnosis Date  . Seizure   . ETOHism   . Aneurysm   . Hypertension   . Hepatitis C   . Hereditary and idiopathic peripheral neuropathy 06/03/2014      Past Surgical History  Procedure Laterality Date  . Cerebral aneurysm repair      At [facility]  . I&d extremity Left 10/26/2012    Procedure: IRRIGATION  AND DEBRIDEMENT Left Elbow;  Surgeon:  Sheral Apley, MD;  Location: [facility] OR;  Service: Orthopedics;  Laterality: Left;  . I&d extremity Left 10/29/2012    Procedure: IRRIGATION AND DEBRIDEMENT EXTREMITY, wound vac change, stimulan beads;  Surgeon: Sheral Apley, MD;  Location: [facility] OR;  Service: Orthopedics;  Laterality: Left;  lateral on bean bag  . Hardware removal Left 10/29/2012    Procedure: HARDWARE REMOVAL;  Surgeon: Sheral Apley, MD;  Location: [facility] OR;  Service: Orthopedics;  Laterality: Left;  . Incision and drainage abscess Left 11/02/2012    Procedure: INCISION AND DRAINAGE ABSCESS;  Surgeon: Sheral Apley, MD;  Location: [facility] OR;  Service: Orthopedics;  Laterality: Left;  . I&d extremity Left 11/02/2012    Procedure: IRRIGATION AND DEBRIDEMENT EXTREMITY;  Surgeon: Sheral Apley, MD;  Location: [facility] OR;  Service: Orthopedics;  Laterality: Left;    Social History History  Substance Use Topics  . Smoking status: Former Smoker    Types: Cigarettes  . Smokeless tobacco: Never Used  . Alcohol Use: No     Comment: quit drinking 11/2011 per patient    Family History Family History  Problem Relation Age of Onset  . Hypertension Mother   . Cancer Mother   . Hypertension Father   . Diabetes Sister   . Cancer Sister   . Cancer Sister     Prior to Admission medications   Medication Sig Start Date End Date Taking? Authorizing Provider  carbamazepine (TEGRETOL) 200 MG tablet Take 2 tablets (400 mg total) by mouth 3 (three) times daily. 09/03/14  Yes York Spaniel, MD  folic acid (FOLVITE) 1 MG tablet Take 1 tablet (1 mg total) by mouth daily. 08/15/14  Yes [name], MD  levETIRAcetam (KEPPRA) 1000 MG tablet Take 1 tablet (1,000 mg total) by mouth 2 (two) times daily. 09/03/14  Yes York Spaniel, MD  metoprolol (LOPRESSOR) 50 MG tablet Take 0.5 tablets (25 mg total) by mouth 2 (two) times daily. 09/10/14  Yes [name], MD  pregabalin (LYRICA) 50 MG capsule Take 2 tablets in the morning, 1 tablet at noon  and 2 tablets at bedtime. 09/03/14  Yes York Spaniel, MD  pregabalin (LYRICA) 50 MG capsule Take 2 tablets in the morning, 1 tablet at noon and 2 tablets at bedtime. Patient not taking: Reported on 10/08/2014 09/03/14   York Spaniel, MD  thiamine 100 MG tablet Take 1 tablet (100 mg total) by mouth daily. Patient not taking: Reported on 10/08/2014 03/25/14   [name], MD    No Known Allergies  Physical Exam  Vitals  Blood pressure 139/85, pulse 63, temperature 98.2 F (36.8 C), temperature source Oral, resp. rate 15, SpO2 97 %.   1. General: Restless in bed  2. Normal affect and insight, Not Suicidal or Homicidal, Awake Alert, Oriented X 3, however, is lethargic and disoriented.  3. No F.N deficits, ALL C.Nerves Intact, Strength 5/5 all 4 extremities, Sensation intact all 4 extremities, Plantars down going.  4. Ears and Eyes appear Normal, Conjunctivae clear, PERRLA. Moist Oral Mucosa.  5. Supple Neck, No JVD, No cervical lymphadenopathy appriciated, No Carotid Bruits.  6. Symmetrical Chest wall movement, Good air movement bilaterally, CTAB.  7. RRR, No Gallops, Rubs or Murmurs, No Parasternal Heave.  8. Positive Bowel Sounds, Abdomen Soft, Non tender, No organomegaly appriciated,No rebound -guarding or rigidity.  9.  No Cyanosis, Normal Skin Turgor, No Skin Rash or Bruise.  10. Good muscle tone,  joints appear normal , no effusions, Normal ROM.  11. No Palpable Lymph Nodes in Neck or Axillae  Data Review CBC  Recent Labs Lab 10/08/14 1036  WBC 6.3  HGB 13.5  HCT 38.2*  PLT 148*  MCV 98.2  MCH 34.7*  MCHC 35.3  RDW 12.0  LYMPHSABS 2.4  MONOABS 0.8  EOSABS 0.0  BASOSABS 0.0   ------------------------------------------------------------------------------------------------------------------  Chemistries   Recent Labs Lab 10/08/14 1142  NA 131*  K 4.1  CL 99*  CO2 26  GLUCOSE 102*  BUN 12  CREATININE 0.75  CALCIUM 8.2*  AST 29  ALT 21    ALKPHOS 70  BILITOT 0.6   ------------------------------------------------------------------------------------------------------------------ CrCl cannot be calculated (Unknown ideal weight.). ------------------------------------------------------------------------------------------------------------------ No results for input(s): TSH, T4TOTAL, T3FREE, THYROIDAB in the last 72 hours.  Invalid input(s): FREET3   Coagulation profile No results for input(s): INR, PROTIME in the last 168 hours. ------------------------------------------------------------------------------------------------------------------- No results for input(s): DDIMER in the last 72 hours. -------------------------------------------------------------------------------------------------------------------  Cardiac Enzymes No results for input(s): CKMB, TROPONINI, MYOGLOBIN in the last 168 hours.  Invalid input(s): CK ------------------------------------------------------------------------------------------------------------------ Invalid input(s): POCBNP   ---------------------------------------------------------------------------------------------------------------  Urinalysis    Component Value Date/Time   COLORURINE AMBER* 01/23/2014 1209   APPEARANCEUR CLOUDY* 01/23/2014 1209   LABSPEC 1.019 01/23/2014 1209   PHURINE 6.0 01/23/2014 1209   GLUCOSEU NEGATIVE 01/23/2014 1209   HGBUR TRACE* 01/23/2014 1209   BILIRUBINUR small 03/10/2014 1431   BILIRUBINUR SMALL* 01/23/2014 1209   KETONESUR NEGATIVE 01/23/2014 1209   PROTEINUR 30 03/10/2014 1431   PROTEINUR NEGATIVE 01/23/2014 1209   UROBILINOGEN 0.2 03/10/2014 1431   UROBILINOGEN >8.0* 01/23/2014 1209   NITRITE neg 03/10/2014 1431   NITRITE POSITIVE* 01/23/2014 1209   LEUKOCYTESUR Trace 03/10/2014 1431    ----------------------------------------------------------------------------------------------------------------  Imaging results  Dg Ribs  Unilateral W/chest Right  10/08/2014   CLINICAL DATA:  54 year old male with seizure yesterday morning and fall. Pain radiating to the right axilla and spine. Initial encounter.  EXAM: RIGHT RIBS AND CHEST - 3+ VIEW  COMPARISON:  Chest and left rib series 6 05/2014.  FINDINGS: Semi upright AP view of the chest. Low lung volumes with diffuse crowding of markings. Stable cardiomegaly and mediastinal contours. No pneumothorax, pleural effusion, or consolidation identified. Small calcified granuloma in the right mid lung is stable.  Chronic right posterior eighth rib fracture with callus. Chronic appearing right lateral fourth rib fracture. No acute displaced right rib fracture identified. Grossly intact visualized spinal vertebrae. No acute osseous abnormality identified.  IMPRESSION: 1. 1. No acute displaced right rib fracture identified. Chronic right fourth and eighth rib fractures. 2. Low lung volumes, otherwise no acute cardiopulmonary abnormality.   Electronically Signed   By: Odessa Fleming M.D.   On: 10/08/2014 12:11   Ct Head Wo Contrast  10/08/2014   CLINICAL DATA:  Unwitnessed seizure yesterday and this morning. Fall with seizure yesterday. ETOH, hepatitis-C, hypertension.  EXAM: CT HEAD WITHOUT CONTRAST  TECHNIQUE: Contiguous axial images were obtained from the base of the skull through the vertex without intravenous contrast.  COMPARISON:  Head CT dated 08/22/2014.  FINDINGS: Again noted is mild generalized brain atrophy with commensurate dilatation of the ventricles and sulci. Also again noted is bifrontal encephalomalacia, left greater than right, suggestive of a previous traumatic brain injury. Minimal chronic small vessel ischemic change noted within the deep periventricular white matter and there is a small old lacunar infarct within the right basal ganglia region.  There is no mass, hemorrhage, edema, or other  evidence of acute parenchymal abnormality. No extra-axial hemorrhage. No acute osseous  fracture or dislocation. Old displaced nasal bone fractures are stable in alignment.  IMPRESSION: Atrophy and chronic ischemic/traumatic changes as detailed above.  No evidence of acute intracranial abnormality. No intracranial mass, hemorrhage, or edema. No fracture.   Electronically Signed   By: Bary RichardStan  Maynard M.D.   On: 10/08/2014 13:35        Becki Mccaskill M.D on 10/08/2014 at 4:26 PM  Between 7am to 7pm - Pager - [phone]  After 7pm go to [email or web address] - password [email or web address]  And look for the night coverage person covering me after hours  [facility] Office  [phone]

## 2014-10-08 NOTE — Progress Notes (Signed)
Patient is disoriented to time, place, event. Concerned about belongings. He wanted pink watch placed on his left arm. He has a wallet, no money, misc cards; returned to his trouser pocket at his request.He has a carabiner with keys,nail clippers, cross attached to pants loop. Sandals, shirt, socks,in belongings bag. Underwear on. Spoke with his Case Manager from [facility]. I have referred her to Marcelle Smilinghonda Davis, [facility]. I have informed Dr.Ranga.

## 2014-10-08 NOTE — ED Notes (Signed)
Bed: WA02 Expected date:  Expected time:  Means of arrival:  Comments: EMS-SZ 

## 2014-10-08 NOTE — ED Notes (Signed)
Patient began to shout "your not doing anything to help me". Pt made aware that BP cuff and monitor are needed to assist with plan of care. Pt is not agreeable to wearing and asked that he not be touched. Will f/u

## 2014-10-08 NOTE — ED Notes (Signed)
Ok to send pt to floor at this time per hospitalist at bedside.

## 2014-10-08 NOTE — ED Notes (Addendum)
No change in behavior noted after ativan, attempting to have pt void in urinal at this time unsuccessful.

## 2014-10-08 NOTE — ED Notes (Addendum)
PER EMS - pt from home with c/o seizure yesterday and this AM, unwitnessed.  Pt reports fall with sz yesterday and c/o R axilla/back pain which is the main complaint.  Pt reports compliant with medications.  A+Ox4.   Initially screaming 2/2 pain per EMS, resting soundly after fentanyl given by EMS

## 2014-10-08 NOTE — ED Notes (Signed)
Initial Contact - pt resting soundly on stretcher, arousable to verbal stim, drowsy, mumbling/incoherent at most times.  Pt denies pain or other complaints.  Skin PWD.  MAEI.  RR even/un-lab.  LSCTAB, difficult to assess 2/2 poor effort, no crepitus noted.  Placed to cardiac/02 monitor.  Seizure precautions initiated/maintained.  NAD.

## 2014-10-08 NOTE — ED Notes (Signed)
Pt pulled off all lines, pulled out own IV, confused and swinging at staff, yelling, difficult to redirect. EDPA aware.

## 2014-10-09 ENCOUNTER — Observation Stay (HOSPITAL_COMMUNITY): Payer: Medicaid Other

## 2014-10-09 DIAGNOSIS — T421X1A Poisoning by iminostilbenes, accidental (unintentional), initial encounter: Secondary | ICD-10-CM

## 2014-10-09 DIAGNOSIS — B182 Chronic viral hepatitis C: Secondary | ICD-10-CM

## 2014-10-09 DIAGNOSIS — K7682 Hepatic encephalopathy: Secondary | ICD-10-CM | POA: Diagnosis present

## 2014-10-09 DIAGNOSIS — J441 Chronic obstructive pulmonary disease with (acute) exacerbation: Secondary | ICD-10-CM

## 2014-10-09 DIAGNOSIS — R7989 Other specified abnormal findings of blood chemistry: Secondary | ICD-10-CM | POA: Diagnosis present

## 2014-10-09 DIAGNOSIS — K7290 Hepatic failure, unspecified without coma: Secondary | ICD-10-CM | POA: Diagnosis present

## 2014-10-09 LAB — MAGNESIUM: Magnesium: 1.8 mg/dL (ref 1.7–2.4)

## 2014-10-09 LAB — TSH: TSH: 2.965 u[IU]/mL (ref 0.350–4.500)

## 2014-10-09 LAB — PHOSPHORUS: Phosphorus: 3.9 mg/dL (ref 2.5–4.6)

## 2014-10-09 LAB — COMPREHENSIVE METABOLIC PANEL
ALBUMIN: 3.4 g/dL — AB (ref 3.5–5.0)
ALT: 18 U/L (ref 17–63)
AST: 24 U/L (ref 15–41)
Alkaline Phosphatase: 70 U/L (ref 38–126)
Anion gap: 7 (ref 5–15)
BILIRUBIN TOTAL: 0.7 mg/dL (ref 0.3–1.2)
BUN: 10 mg/dL (ref 6–20)
CALCIUM: 8.4 mg/dL — AB (ref 8.9–10.3)
CHLORIDE: 97 mmol/L — AB (ref 101–111)
CO2: 25 mmol/L (ref 22–32)
CREATININE: 0.64 mg/dL (ref 0.61–1.24)
GFR calc Af Amer: >60 mL/min (ref >60)
GFR calc non Af Amer: >60 mL/min (ref >60)
GLUCOSE: 104 mg/dL — AB (ref 65–99)
Potassium: 4 mmol/L (ref 3.5–5.1)
Sodium: 129 mmol/L — ABNORMAL LOW (ref 135–145)
TOTAL PROTEIN: 6.8 g/dL (ref 6.5–8.1)

## 2014-10-09 LAB — PROTIME-INR
INR: 1.16 (ref 0.00–1.49)
PROTHROMBIN TIME: 14.9 s (ref 11.6–15.2)

## 2014-10-09 LAB — AMMONIA: Ammonia: 41 umol/L — ABNORMAL HIGH (ref 9–35)

## 2014-10-09 MED ORDER — LACTULOSE 10 GM/15ML PO SOLN
20.0000 g | Freq: Two times a day (BID) | ORAL | Status: AC
  Administered 2014-10-09 – 2014-10-10 (×3): 20 g via ORAL
  Filled 2014-10-09 (×3): qty 30

## 2014-10-09 MED ORDER — LACTULOSE 10 GM/15ML PO SOLN: 20.0000 g | Freq: Once | ORAL | Status: DC

## 2014-10-09 MED ORDER — MORPHINE SULFATE 2 MG/ML IJ SOLN
2.0000 mg | INTRAMUSCULAR | Status: DC | PRN
  Administered 2014-10-12 – 2014-10-19 (×28): 2 mg via INTRAVENOUS
  Filled 2014-10-09 (×31): qty 1

## 2014-10-09 MED ORDER — OXYCODONE-ACETAMINOPHEN 5-325 MG PO TABS
2.0000 | ORAL_TABLET | Freq: Four times a day (QID) | ORAL | Status: DC | PRN
  Administered 2014-10-09 – 2014-10-20 (×26): 2 via ORAL
  Filled 2014-10-09 (×27): qty 2

## 2014-10-09 NOTE — Care Management Note (Signed)
Case Management Note  Patient Details  Name: [name] [name] MRN: [id number] Date of Birth: 31-Oct-1960  Subjective/Objective:           etoh abuse and seizures         Action/Plan: tbd-home   Expected Discharge Date:   (unknown)       [id number]        Expected Discharge Plan:  Home/Self Care  In-House Referral:  Clinical Social Work  Discharge planning Services  CM Consult  Post Acute Care Choice:  NA Choice offered to:  NA  DME Arranged:  N/A DME Agency:  NA  HH Arranged:  NA HH Agency:  NA  Status of Service:  In process, will continue to follow  Medicare Important Message Given:    Date Medicare IM Given:    Medicare IM give by:    Date Additional Medicare IM Given:    Additional Medicare Important Message give by:     If discussed at Long Length of Stay Meetings, dates discussed:    Additional Comments:  Golda Acre, RN 10/09/2014, 3:51 PM

## 2014-10-09 NOTE — Progress Notes (Signed)
PT Cancellation Note  Patient Details Name: [name] [name] MRN: [id number] DOB: 04-Jun-1960   Cancelled Treatment:    Reason Eval/Treat Not Completed: Patient declined, (stated he wanted to watch TV show, adamnt about it. )   Rada Hay 10/09/2014, 2:20 PM

## 2014-10-09 NOTE — Progress Notes (Signed)
Date:  October 09, 2014 U.R. performed for needs and level of care. Will continue to follow for Case Management needs.  Yohanna Tow, RN, BSN, CCM   [phone] 

## 2014-10-09 NOTE — Progress Notes (Signed)
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 He had CT brain without contrast which showed "Atrophy and chronic ischemic/traumatic changes as detailed above. No evidence of acute intracranial abnormality. No intracranial mass, hemorrhage, or edema. No fracture", Rib Xray which showed "1. No acute displaced right rib fracture identified. Chronic right fourth and eighth rib fractures. 2. Low lung volumes, otherwise no acute cardiopulmonary abnormality". Labs are significant for normal white count, Tegretol level 20, but UA/urine culture is pending. Apparently, patient has been sick for the last couple of days and has been coughing, there has been taking his medications as usual so it appears that he likely developed an acute infection leading to encephalopathy. He will therefore be admitted to the stepdown unit for further workup including UA/urine culture/ammonia level. Will hold Tegretol, give empiric antibodies, incentive spirometry, oxygen supplementation and nicotine patch. Will continue to optimize they clinical history as patient's sensorium improves. The history was limited as there were no family members to corroborate patient's story- which is unreliable as he remains confused yet makes an effort to answer questions. Subjective/Overnight developments 10/09/2014: Ammonia level 41,   chest x-ray shows "Mild interval decrease in the conspicuity of the pulmonary interstitium may reflect resolving interstitial edema or pneumonia. When the patient can tolerate the procedure, a PA and lateral chest x-ray with deep inspiration would be useful". UA/UC unremarkable. Patient coughing, he has pleuritic chest pain/weak cough. He says that Tegretol is the only medication that really helps seizures and he has used it for the past 6 years. Will continue current antibiotics, incentive spirometry, broncho-dilators, oxygen supplementation as needed, add morphine/Percocet for pain, add lactulose and reassess ammonia level, and consult neurology regarding and that convulsants. Plan Encephalopathy, metabolic/Tegretol toxicity/COPD with acute exacerbation/S/P cerebral aneurysm repair/Seizures/Tobacco dependency/History of rib fracture/elevated ammonia level/hyponatremia   Consult Neurology   Continue current medications   Mobilize. Chronic liver disease/Chronic hepatitis C without hepatic coma/mild hepatic encephalopathy  Lactulose, reassess ammonia level  Thiamine/folate Essential hypertension, benign  BP not optimally controlled   Continue home medications and adjust accordingly to optimize control  DVT/GI Prophylaxis  Lovenox  PPI Family Communication: Discussed plan of care with patient at bedside. No relatives. Code Status   Full Code Likely DC  Home eventually  Consultants:   Neurology  Procedures:   none  Antibiotics:   Levaquin 10/08/2014>  HPI/Subjective: Feels better. Complains of pain in the chest which is pluritic in nature.  Objective: Filed Vitals:   10/09/14 0800  BP: 154/96  Pulse: 71  Temp: 97.5 F (36.4 C)  Resp: 17    Intake/Output Summary (Last 24 hours) at 10/09/14 0845 Last data filed at 10/09/14 0700  Gross per 24 hour  Intake   1625 ml  Output   1000 ml  Net    625 ml   Filed Weights   10/08/14 1645  Weight: 89.7 kg (197 lb 12  oz)    Exam:   General:  Comfortable at rest.  Cardiovascular: S1-S2 normal. No murmurs. Pulse regular.  Respiratory: Good air entry bilaterally. bibasilar rhonchi . Coughing.  Abdomen: Soft and nontender. Normal bowel sounds. No organomegaly.  Musculoskeletal: No pedal edema   Neurological: Intact  Data Reviewed: Basic Metabolic Panel:  Recent Labs Lab 10/08/14 1142 10/09/14 0340  NA 131* 129*  K 4.1 4.0  CL 99* 97*  CO2 26 25  GLUCOSE 102* 104*  BUN 12 10  CREATININE 0.75 0.64  CALCIUM 8.2* 8.4*  MG  --  1.8  PHOS  --  3.9   Liver Function Tests:  Recent Labs Lab 10/08/14 1142 10/09/14 0340  AST 29 24  ALT 21 18  ALKPHOS 70 70  BILITOT 0.6 0.7  PROT 7.4 6.8  ALBUMIN 3.8 3.4*   No results for input(s): LIPASE, AMYLASE in the last 168 hours.  Recent Labs Lab 10/08/14 1508 10/09/14 0340  AMMONIA 26 41*   CBC:  Recent Labs Lab 10/08/14 1036  WBC 6.3  NEUTROABS 3.1  HGB 13.5  HCT 38.2*  MCV 98.2  PLT 148*   Cardiac Enzymes: No results for input(s): CKTOTAL, CKMB, CKMBINDEX, TROPONINI in the last 168 hours. BNP (last 3 results) No results for input(s): BNP in the last 8760 hours.  ProBNP (last 3 results) No results for input(s): PROBNP in the last 8760 hours.  CBG:  Recent Labs Lab 10/08/14 1059  GLUCAP 108*  108*    Recent Results (from the past 240 hour(s))  MRSA PCR Screening     Status: None   Collection Time: 10/08/14  4:46 PM  Result Value Ref Range Status   MRSA by PCR NEGATIVE NEGATIVE Final    Comment:        The GeneXpert MRSA Assay (FDA approved for NASAL specimens only), is one component of a comprehensive MRSA colonization surveillance program. It is not intended to diagnose MRSA infection nor to guide or monitor treatment for MRSA infections.      Studies: Dg Ribs Unilateral W/chest Right  10/08/2014   CLINICAL DATA:  54 year old male with seizure yesterday morning and fall. Pain radiating to the right  axilla and spine. Initial encounter.  EXAM: RIGHT RIBS AND CHEST - 3+ VIEW  COMPARISON:  Chest and left rib series 6 05/2014.  FINDINGS: Semi upright AP view of the chest. Low lung volumes with diffuse crowding of markings. Stable cardiomegaly and mediastinal contours. No pneumothorax, pleural effusion, or consolidation identified. Small calcified granuloma in the right mid lung is stable.  Chronic right posterior eighth rib fracture with callus. Chronic appearing right lateral fourth rib fracture. No acute displaced right rib fracture identified. Grossly intact visualized spinal vertebrae. No acute osseous abnormality identified.  IMPRESSION: 1. 1. No acute displaced right rib fracture identified. Chronic right fourth and eighth rib fractures. 2. Low lung volumes, otherwise no acute cardiopulmonary abnormality.   Electronically Signed   By: Odessa Fleming M.D.   On: 10/08/2014 12:11   Ct Head Wo Contrast  10/08/2014   CLINICAL DATA:  Unwitnessed seizure yesterday and this morning. Fall with seizure yesterday. ETOH, hepatitis-C, hypertension.  EXAM: CT HEAD WITHOUT CONTRAST  TECHNIQUE: Contiguous axial images were obtained from the base of the skull through the vertex without intravenous contrast.  COMPARISON:  Head CT dated 08/22/2014.  FINDINGS: Again noted is mild generalized brain atrophy with commensurate dilatation of the ventricles and sulci. Also again noted is bifrontal encephalomalacia, left greater than right, suggestive of a previous traumatic brain injury. Minimal chronic small vessel ischemic change noted within the deep periventricular white matter  and there is a small old lacunar infarct within the right basal ganglia region.  There is no mass, hemorrhage, edema, or other evidence of acute parenchymal abnormality. No extra-axial hemorrhage. No acute osseous fracture or dislocation. Old displaced nasal bone fractures are stable in alignment.  IMPRESSION: Atrophy and chronic ischemic/traumatic changes as  detailed above.  No evidence of acute intracranial abnormality. No intracranial mass, hemorrhage, or edema. No fracture.   Electronically Signed   By: Bary Richard M.D.   On: 10/08/2014 13:35   Dg Chest Port 1 View  10/09/2014   CLINICAL DATA:  Cough and congestion common no clinical improvement reported by the patient.  EXAM: PORTABLE CHEST - 1 VIEW  COMPARISON:  Chest x-ray of October 08, 2014  FINDINGS: The lungs are adequately inflated. The interstitial markings remain increased but have improved since yesterday's study. There is no pleural effusion. The heart and pulmonary vascularity are normal. There is tortuosity of the ascending and descending thoracic aorta. The bony thorax is unremarkable.  IMPRESSION: Mild interval decrease in the conspicuity of the pulmonary interstitium may reflect resolving interstitial edema or pneumonia. When the patient can tolerate the procedure, a PA and lateral chest x-ray with deep inspiration would be useful.   Electronically Signed   By: David  [place] M.D.   On: 10/09/2014 08:38    Scheduled Meds: . enoxaparin (LOVENOX) injection  40 mg Subcutaneous Q24H  . folic acid  1 mg Oral Daily  . levETIRAcetam  1,000 mg Oral BID  . levofloxacin (LEVAQUIN) IV  750 mg Intravenous Q24H  . metoprolol  25 mg Oral BID  . nicotine  21 mg Transdermal Daily  . pantoprazole  40 mg Oral Daily  . pneumococcal 23 valent vaccine  0.5 mL Intramuscular Tomorrow-1000  . pregabalin  75 mg Oral BID  . thiamine  100 mg Oral Daily   Continuous Infusions: . dextrose 5 % and 0.9 % NaCl with KCl 20 mEq/L 75 mL/hr at 10/08/14 1700     Time spent: 25 minutes    Nikola Marone  [facility] Pager [phone]. If 7PM-7AM, please contact night-coverage at [email or web address], password [facility] 10/09/2014, 8:45 AM  LOS: 1 day

## 2014-10-09 NOTE — Consult Note (Signed)
Admission H&P    Chief Complaint: Mental status changes with encephalopathy and Tegretol toxicity.  HPI: [name] [name] is an 54 y.o. male with a history of aneurysm rupture and repair, seizure disorder, alcohol abuse, hypertension, hepatitis C and hereditary peripheral neuropathy, admitted with mental status changes and encephalopathy. Patient also indicated that he had seizures 1 day prior to admission. He's had no seizures since admission. Tegretol level was elevated at the time of admission (20.0). Patient was also hyponatremic with sodium of 129. He reportedly has been taking Tegretol 1200 mg per day in divided dose, as well as Keppra. Repeat Tegretol level from this morning is pending. 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 No acute intracranial abnormality was seen. He's been agitated intermittently, requiring benzodiazepine medication and a sitter.  Past Medical History  Diagnosis Date  . Seizure   . ETOHism   . Aneurysm   . Hypertension   . Hepatitis C   . Hereditary and idiopathic peripheral neuropathy 06/03/2014    Past Surgical History  Procedure Laterality Date  . Cerebral aneurysm repair      At [facility]  . I&d extremity Left 10/26/2012    Procedure: IRRIGATION AND DEBRIDEMENT Left Elbow;  Surgeon: Renette Butters, MD;  Location: [place];  Service: Orthopedics;  Laterality: Left;  . I&d extremity Left 10/29/2012    Procedure: IRRIGATION AND DEBRIDEMENT EXTREMITY, wound vac change, stimulan beads;  Surgeon: Renette Butters, MD;  Location: [place];  Service: Orthopedics;  Laterality: Left;  lateral on bean bag  . Hardware removal Left 10/29/2012    Procedure: HARDWARE REMOVAL;  Surgeon: Renette Butters, MD;  Location: [place];  Service: Orthopedics;  Laterality: Left;  . Incision and drainage abscess Left 11/02/2012    Procedure: INCISION AND DRAINAGE ABSCESS;   Surgeon: Renette Butters, MD;  Location: [place];  Service: Orthopedics;  Laterality: Left;  . I&d extremity Left 11/02/2012    Procedure: IRRIGATION AND DEBRIDEMENT EXTREMITY;  Surgeon: Renette Butters, MD;  Location: [place];  Service: Orthopedics;  Laterality: Left;    Family History  Problem Relation Age of Onset  . Hypertension Mother   . Cancer Mother   . Hypertension Father   . Diabetes Sister   . Cancer Sister   . Cancer Sister    Social History:  reports that he has quit smoking. His smoking use included Cigarettes. He has never used smokeless tobacco. He reports that he does not drink alcohol or use illicit drugs.  Allergies: No Known Allergies  Medications Prior to Admission  Medication Sig Dispense Refill  . carbamazepine (TEGRETOL) 200 MG tablet Take 2 tablets (400 mg total) by mouth 3 (three) times daily. 517 tablet 5  . folic acid (FOLVITE) 1 MG tablet Take 1 tablet (1 mg total) by mouth daily. 30 tablet 2  . levETIRAcetam (KEPPRA) 1000 MG tablet Take 1 tablet (1,000 mg total) by mouth 2 (two) times daily. 60 tablet 5  . metoprolol (LOPRESSOR) 50 MG tablet Take 0.5 tablets (25 mg total) by mouth 2 (two) times daily. 60 tablet 3  . pregabalin (LYRICA) 50 MG capsule Take 2 tablets in the morning, 1 tablet at noon and 2 tablets at bedtime. 150 capsule 5  . pregabalin (LYRICA) 50 MG capsule Take 2 tablets in the morning, 1 tablet at noon and 2 tablets at bedtime. (Patient not taking: Reported on 10/08/2014)  150 capsule 3  . thiamine 100 MG tablet Take 1 tablet (100 mg total) by mouth daily. (Patient not taking: Reported on 10/08/2014) 30 tablet 2    ROS: History obtained from chart review and the patient  General ROS: negative for - chills, fatigue, fever, night sweats, weight gain or weight loss Psychological ROS: As noted in present illness Ophthalmic ROS: negative for - blurry vision, double vision, eye pain or loss of vision ENT ROS: negative for - epistaxis, nasal  discharge, oral lesions, sore throat, tinnitus or vertigo Allergy and Immunology ROS: negative for - hives or itchy/watery eyes Hematological and Lymphatic ROS: negative for - bleeding problems, bruising or swollen lymph nodes Endocrine ROS: negative for - galactorrhea, hair pattern changes, polydipsia/polyuria or temperature intolerance Respiratory ROS: negative for - cough, hemoptysis, shortness of breath or wheezing Cardiovascular ROS: negative for - chest pain, dyspnea on exertion, edema or irregular heartbeat Gastrointestinal ROS: negative for - abdominal pain, diarrhea, hematemesis, nausea/vomiting or stool incontinence Genito-Urinary ROS: negative for - dysuria, hematuria, incontinence or urinary frequency/urgency Musculoskeletal ROS: negative for - joint swelling or muscular weakness Neurological ROS: as noted in HPI Dermatological ROS: negative for rash and skin lesion changes  Physical Examination: Blood pressure 154/79, pulse 71, temperature 97.5 F (36.4 C), temperature source Oral, resp. rate 17, height _0  (1.88 m), weight 89.7 kg (197 lb 12 oz), SpO2 94 %.  HEENT-  Normocephalic, no lesions, without obvious abnormality.  Normal external eye and conjunctiva.  Normal TM's bilaterally.  Normal auditory canals and external ears. Normal external nose, mucus membranes and septum.  Normal pharynx. Neck supple with no masses, nodes, nodules or enlargement. Cardiovascular - regular rate and rhythm, S1, S2 normal, no murmur, click, rub or gallop Lungs - chest clear, no wheezing, rales, normal symmetric air entry Abdomen - soft, non-tender; bowel sounds normal; no masses,  no organomegaly Extremities - no joint deformities, effusion, or inflammation and no edema  Neurologic Examination: Mental Status: Drowsy, oriented 3, complaining of abdominal and back pain.  Speech slightly slurred without evidence of aphasia. Able to follow commands without difficulty. Cranial Nerves: II-Visual  fields were normal. III/IV/VI-Pupils were equal and reacted normally to light. Extraocular movements were full and conjugate.    V/VII-no facial numbness and no facial weakness. VIII-normal. X- speech slightly slurred, commensurate with level of alertness; symmetrical palatal movement. XI: trapezius strength/neck flexion strength normal bilaterally XII-midline tongue extension with normal strength. Motor: 5/5 bilaterally with normal tone and bulk Sensory: Normal throughout. Deep Tendon Reflexes: 1+ and symmetric in upper extremities and trace only at the knees. Plantars: Mute bilaterally Cerebellar: Normal finger-to-nose testing. Carotid auscultation: Normal  Results for orders placed or performed during the hospital encounter of 10/08/14 (from the past 48 hour(s))  Carbamazepine (Tegretol) Level  (if patient is taking this medication)     Status: Abnormal   Collection Time: 10/08/14 10:36 AM  Result Value Ref Range   Carbamazepine Lvl 20.0 (HH) 4.0 - 12.0 ug/mL    Comment: REPEATED TO VERIFY CRITICAL RESULT CALLED TO, READ BACK BY AND VERIFIED WITH: DAVIS,NATALIE RN [place] @ 1205 10/08/14 LEONARD,A Performed at [facility]   CBC with Differential     Status: Abnormal   Collection Time: 10/08/14 10:36 AM  Result Value Ref Range   WBC 6.3 4.0 - 10.5 K/uL   RBC 3.89 (L) 4.22 - 5.81 MIL/uL   Hemoglobin 13.5 13.0 - 17.0 g/dL   HCT 38.2 (L) 39.0 - 52.0 %  MCV 98.2 78.0 - 100.0 fL   MCH 34.7 (H) 26.0 - 34.0 pg   MCHC 35.3 30.0 - 36.0 g/dL   RDW 12.0 11.5 - 15.5 %   Platelets 148 (L) 150 - 400 K/uL   Neutrophils Relative % 48 43 - 77 %   Neutro Abs 3.1 1.7 - 7.7 K/uL   Lymphocytes Relative 39 12 - 46 %   Lymphs Abs 2.4 0.7 - 4.0 K/uL   Monocytes Relative 12 3 - 12 %   Monocytes Absolute 0.8 0.1 - 1.0 K/uL   Eosinophils Relative 1 0 - 5 %   Eosinophils Absolute 0.0 0.0 - 0.7 K/uL   Basophils Relative 0 0 - 1 %   Basophils Absolute 0.0 0.0 - 0.1 K/uL  CBG monitoring,  ED     Status: Abnormal   Collection Time: 10/08/14 10:59 AM  Result Value Ref Range   Glucose-Capillary 108 (H) 65 - 99 mg/dL  CBG monitoring, ED     Status: Abnormal   Collection Time: 10/08/14 10:59 AM  Result Value Ref Range   Glucose-Capillary 108 (H) 65 - 99 mg/dL  Comprehensive metabolic panel     Status: Abnormal   Collection Time: 10/08/14 11:42 AM  Result Value Ref Range   Sodium 131 (L) 135 - 145 mmol/L   Potassium 4.1 3.5 - 5.1 mmol/L   Chloride 99 (L) 101 - 111 mmol/L   CO2 26 22 - 32 mmol/L   Glucose, Bld 102 (H) 65 - 99 mg/dL   BUN 12 6 - 20 mg/dL   Creatinine, Ser 0.75 0.61 - 1.24 mg/dL   Calcium 8.2 (L) 8.9 - 10.3 mg/dL   Total Protein 7.4 6.5 - 8.1 g/dL   Albumin 3.8 3.5 - 5.0 g/dL   AST 29 15 - 41 U/L   ALT 21 17 - 63 U/L   Alkaline Phosphatase 70 38 - 126 U/L   Total Bilirubin 0.6 0.3 - 1.2 mg/dL   GFR calc non Af Amer >60 >60 mL/min   GFR calc Af Amer >60 >60 mL/min    Comment: (NOTE) The eGFR has been calculated using the CKD EPI equation. This calculation has not been validated in all clinical situations. eGFR's persistently <60 mL/min signify possible Chronic Kidney Disease.    Anion gap 6 5 - 15  Ethanol     Status: None   Collection Time: 10/08/14  1:44 PM  Result Value Ref Range   Alcohol, Ethyl (B) <5 <5 mg/dL    Comment:        LOWEST DETECTABLE LIMIT FOR SERUM ALCOHOL IS 5 mg/dL FOR MEDICAL PURPOSES ONLY   Ammonia     Status: None   Collection Time: 10/08/14  3:08 PM  Result Value Ref Range   Ammonia 26 9 - 35 umol/L  I-Stat Troponin, ED (not at [facility])     Status: None   Collection Time: 10/08/14  3:12 PM  Result Value Ref Range   Troponin i, poc 0.00 0.00 - 0.08 ng/mL   Comment 3            Comment: Due to the release kinetics of cTnI, a negative result within the first hours of the onset of symptoms does not rule out myocardial infarction with certainty. If myocardial infarction is still suspected, repeat the test at appropriate  intervals.   MRSA PCR Screening     Status: None   Collection Time: 10/08/14  4:46 PM  Result Value Ref Range  MRSA by PCR NEGATIVE NEGATIVE    Comment:        The GeneXpert MRSA Assay (FDA approved for NASAL specimens only), is one component of a comprehensive MRSA colonization surveillance program. It is not intended to diagnose MRSA infection nor to guide or monitor treatment for MRSA infections.   Urine rapid drug screen (hosp performed)     Status: None   Collection Time: 10/08/14  6:03 PM  Result Value Ref Range   Opiates NONE DETECTED NONE DETECTED   Cocaine NONE DETECTED NONE DETECTED   Benzodiazepines NONE DETECTED NONE DETECTED   Amphetamines NONE DETECTED NONE DETECTED   Tetrahydrocannabinol NONE DETECTED NONE DETECTED   Barbiturates NONE DETECTED NONE DETECTED    Comment:        DRUG SCREEN FOR MEDICAL PURPOSES ONLY.  IF CONFIRMATION IS NEEDED FOR ANY PURPOSE, NOTIFY LAB WITHIN 5 DAYS.        LOWEST DETECTABLE LIMITS FOR URINE DRUG SCREEN Drug Class       Cutoff (ng/mL) Amphetamine      1000 Barbiturate      200 Benzodiazepine   235 Tricyclics       361 Opiates          300 Cocaine          300 THC              50   Urine culture     Status: None (Preliminary result)   Collection Time: 10/08/14  6:03 PM  Result Value Ref Range   Specimen Description URINE, CLEAN CATCH    Special Requests NONE    Culture      NO GROWTH < 24 HOURS Performed at [facility]    Report Status PENDING   Urinalysis, Routine w reflex microscopic (not at [facility])     Status: Abnormal   Collection Time: 10/08/14  6:04 PM  Result Value Ref Range   Color, Urine AMBER (A) YELLOW    Comment: BIOCHEMICALS MAY BE AFFECTED BY COLOR   APPearance CLEAR CLEAR   Specific Gravity, Urine 1.024 1.005 - 1.030   pH 6.0 5.0 - 8.0   Glucose, UA NEGATIVE NEGATIVE mg/dL   Hgb urine dipstick NEGATIVE NEGATIVE   Bilirubin Urine NEGATIVE NEGATIVE   Ketones, ur NEGATIVE NEGATIVE mg/dL    Protein, ur NEGATIVE NEGATIVE mg/dL   Urobilinogen, UA 1.0 0.0 - 1.0 mg/dL   Nitrite NEGATIVE NEGATIVE   Leukocytes, UA NEGATIVE NEGATIVE    Comment: MICROSCOPIC NOT DONE ON URINES WITH NEGATIVE PROTEIN, BLOOD, LEUKOCYTES, NITRITE, OR GLUCOSE <1000 mg/dL.  Comprehensive metabolic panel     Status: Abnormal   Collection Time: 10/09/14  3:40 AM  Result Value Ref Range   Sodium 129 (L) 135 - 145 mmol/L   Potassium 4.0 3.5 - 5.1 mmol/L   Chloride 97 (L) 101 - 111 mmol/L   CO2 25 22 - 32 mmol/L   Glucose, Bld 104 (H) 65 - 99 mg/dL   BUN 10 6 - 20 mg/dL   Creatinine, Ser 0.64 0.61 - 1.24 mg/dL   Calcium 8.4 (L) 8.9 - 10.3 mg/dL   Total Protein 6.8 6.5 - 8.1 g/dL   Albumin 3.4 (L) 3.5 - 5.0 g/dL   AST 24 15 - 41 U/L   ALT 18 17 - 63 U/L   Alkaline Phosphatase 70 38 - 126 U/L   Total Bilirubin 0.7 0.3 - 1.2 mg/dL   GFR calc non Af Amer >60 >60 mL/min  GFR calc Af Amer >60 >60 mL/min    Comment: (NOTE) The eGFR has been calculated using the CKD EPI equation. This calculation has not been validated in all clinical situations. eGFR's persistently <60 mL/min signify possible Chronic Kidney Disease.    Anion gap 7 5 - 15  Magnesium     Status: None   Collection Time: 10/09/14  3:40 AM  Result Value Ref Range   Magnesium 1.8 1.7 - 2.4 mg/dL  Phosphorus     Status: None   Collection Time: 10/09/14  3:40 AM  Result Value Ref Range   Phosphorus 3.9 2.5 - 4.6 mg/dL  Protime-INR     Status: None   Collection Time: 10/09/14  3:40 AM  Result Value Ref Range   Prothrombin Time 14.9 11.6 - 15.2 seconds   INR 1.16 0.00 - 1.49  Ammonia     Status: Abnormal   Collection Time: 10/09/14  3:40 AM  Result Value Ref Range   Ammonia 41 (H) 9 - 35 umol/L  TSH     Status: None   Collection Time: 10/09/14  3:45 AM  Result Value Ref Range   TSH 2.965 0.350 - 4.500 uIU/mL   Dg Ribs Unilateral W/chest Right  10/08/2014   CLINICAL DATA:  54 year old male with seizure yesterday morning and fall.  Pain radiating to the right axilla and spine. Initial encounter.  EXAM: RIGHT RIBS AND CHEST - 3+ VIEW  COMPARISON:  Chest and left rib series 6 05/2014.  FINDINGS: Semi upright AP view of the chest. Low lung volumes with diffuse crowding of markings. Stable cardiomegaly and mediastinal contours. No pneumothorax, pleural effusion, or consolidation identified. Small calcified granuloma in the right mid lung is stable.  Chronic right posterior eighth rib fracture with callus. Chronic appearing right lateral fourth rib fracture. No acute displaced right rib fracture identified. Grossly intact visualized spinal vertebrae. No acute osseous abnormality identified.  IMPRESSION: 1. 1. No acute displaced right rib fracture identified. Chronic right fourth and eighth rib fractures. 2. Low lung volumes, otherwise no acute cardiopulmonary abnormality.   Electronically Signed   By: Genevie Ann M.D.   On: 10/08/2014 12:11   Ct Head Wo Contrast  10/08/2014   CLINICAL DATA:  Unwitnessed seizure yesterday and this morning. Fall with seizure yesterday. ETOH, hepatitis-C, hypertension.  EXAM: CT HEAD WITHOUT CONTRAST  TECHNIQUE: Contiguous axial images were obtained from the base of the skull through the vertex without intravenous contrast.  COMPARISON:  Head CT dated 08/22/2014.  FINDINGS: Again noted is mild generalized brain atrophy with commensurate dilatation of the ventricles and sulci. Also again noted is bifrontal encephalomalacia, left greater than right, suggestive of a previous traumatic brain injury. Minimal chronic small vessel ischemic change noted within the deep periventricular white matter and there is a small old lacunar infarct within the right basal ganglia region.  There is no mass, hemorrhage, edema, or other evidence of acute parenchymal abnormality. No extra-axial hemorrhage. No acute osseous fracture or dislocation. Old displaced nasal bone fractures are stable in alignment.  IMPRESSION: Atrophy and chronic  ischemic/traumatic changes as detailed above.  No evidence of acute intracranial abnormality. No intracranial mass, hemorrhage, or edema. No fracture.   Electronically Signed   By: Franki Cabot M.D.   On: 10/08/2014 13:35   Dg Chest Port 1 View  10/09/2014   CLINICAL DATA:  Cough and congestion common no clinical improvement reported by the patient.  EXAM: PORTABLE CHEST - 1 VIEW  COMPARISON:  Chest  x-ray of October 08, 2014  FINDINGS: The lungs are adequately inflated. The interstitial markings remain increased but have improved since yesterday's study. There is no pleural effusion. The heart and pulmonary vascularity are normal. There is tortuosity of the ascending and descending thoracic aorta. The bony thorax is unremarkable.  IMPRESSION: Mild interval decrease in the conspicuity of the pulmonary interstitium may reflect resolving interstitial edema or pneumonia. When the patient can tolerate the procedure, a PA and lateral chest x-ray with deep inspiration would be useful.   Electronically Signed   By: David  Martinique M.D.   On: 10/09/2014 08:38    Assessment/Plan 54 year old man presenting with altered mental status with encephalopathy with history of hepatitis C and elevated ammonia, as well as hyponatremia and Tegretol toxicity.  Recommendations: 1. Continue to hold Tegretol until level returns to normal therapeutic range. 2. Will need to start Tegretol at a lower dose at that time. However, if he remains hyponatremic, he may need to be switched from Tegretol to a different AED. 3. No further imaging studies are indicated at this time, nor EEG study.  We will continue to follow this patient with you.  C.R. Nicole Kindred, MD [facility]  10/09/2014, 12:16 PM

## 2014-10-09 NOTE — Significant Event (Signed)
Appreciate Neurology. Patient stable for transfer to Medsurg.

## 2014-10-10 ENCOUNTER — Inpatient Hospital Stay (HOSPITAL_COMMUNITY): Payer: Medicaid Other

## 2014-10-10 LAB — CBC WITH DIFFERENTIAL/PLATELET
BASOS ABS: 0 10*3/uL (ref 0.0–0.1)
BASOS PCT: 0 % (ref 0–1)
Eosinophils Absolute: 0 10*3/uL (ref 0.0–0.7)
Eosinophils Relative: 0 % (ref 0–5)
HCT: 40.7 % (ref 39.0–52.0)
Hemoglobin: 14.4 g/dL (ref 13.0–17.0)
LYMPHS ABS: 2.6 10*3/uL (ref 0.7–4.0)
Lymphocytes Relative: 43 % (ref 12–46)
MCH: 35.1 pg — AB (ref 26.0–34.0)
MCHC: 35.4 g/dL (ref 30.0–36.0)
MCV: 99.3 fL (ref 78.0–100.0)
Monocytes Absolute: 0.6 10*3/uL (ref 0.1–1.0)
Monocytes Relative: 10 % (ref 3–12)
NEUTROS ABS: 2.8 10*3/uL (ref 1.7–7.7)
NEUTROS PCT: 47 % (ref 43–77)
PLATELETS: 166 10*3/uL (ref 150–400)
RBC: 4.1 MIL/uL — ABNORMAL LOW (ref 4.22–5.81)
RDW: 11.9 % (ref 11.5–15.5)
WBC: 5.9 10*3/uL (ref 4.0–10.5)

## 2014-10-10 LAB — COMPREHENSIVE METABOLIC PANEL
ALK PHOS: 78 U/L (ref 38–126)
ALT: 18 U/L (ref 17–63)
ANION GAP: 7 (ref 5–15)
AST: 25 U/L (ref 15–41)
Albumin: 3.9 g/dL (ref 3.5–5.0)
BUN: <5 mg/dL — ABNORMAL LOW (ref 6–20)
CO2: 28 mmol/L (ref 22–32)
Calcium: 9.2 mg/dL (ref 8.9–10.3)
Chloride: 101 mmol/L (ref 101–111)
Creatinine, Ser: 0.72 mg/dL (ref 0.61–1.24)
GFR calc non Af Amer: >60 mL/min (ref >60)
GLUCOSE: 105 mg/dL — AB (ref 65–99)
Potassium: 4.3 mmol/L (ref 3.5–5.1)
Sodium: 136 mmol/L (ref 135–145)
Total Bilirubin: 0.8 mg/dL (ref 0.3–1.2)
Total Protein: 8.1 g/dL (ref 6.5–8.1)

## 2014-10-10 LAB — CARBAMAZEPINE LEVEL, TOTAL: CARBAMAZEPINE LVL: 3.8 ug/mL — AB (ref 4.0–12.0)

## 2014-10-10 LAB — URINE CULTURE

## 2014-10-10 LAB — CARBAMAZEPINE, FREE AND TOTAL
CARBAMAZEPINE, TOTAL: 9.9 ug/mL (ref 4.0–12.0)
Carbamazepine, Free: 3 ug/mL (ref 0.6–4.2)

## 2014-10-10 LAB — AMMONIA: AMMONIA: 17 umol/L (ref 9–35)

## 2014-10-10 MED ORDER — CARBAMAZEPINE 200 MG PO TABS
400.0000 mg | ORAL_TABLET | Freq: Three times a day (TID) | ORAL | Status: DC
  Administered 2014-10-10 – 2014-10-16 (×18): 400 mg via ORAL
  Filled 2014-10-10 (×20): qty 2

## 2014-10-10 MED ORDER — LEVOFLOXACIN 750 MG PO TABS
750.0000 mg | ORAL_TABLET | Freq: Every day | ORAL | Status: DC
  Administered 2014-10-10 – 2014-10-14 (×5): 750 mg via ORAL
  Filled 2014-10-10 (×6): qty 1

## 2014-10-10 MED ORDER — AMLODIPINE BESYLATE 5 MG PO TABS
5.0000 mg | ORAL_TABLET | Freq: Every day | ORAL | Status: DC
  Administered 2014-10-10 – 2014-10-20 (×11): 5 mg via ORAL
  Filled 2014-10-10 (×11): qty 1

## 2014-10-10 MED ORDER — CARBAMAZEPINE 200 MG PO TABS
400.0000 mg | ORAL_TABLET | Freq: Three times a day (TID) | ORAL | Status: DC
  Filled 2014-10-10 (×3): qty 2

## 2014-10-10 NOTE — Progress Notes (Deleted)
CRITICAL VALUE ALERT  Critical value received:  K 2.7  Date of notification:  10/10/14  Time of notification:  0026  Critical value read back: yes  Nurse who received alert:  Chilton Greathouse  MD notified (1st page):  Craige Cotta  Time of first page:  0030  MD notified (2nd page):  Time of second page:  Responding MD:   Time MD responded:

## 2014-10-10 NOTE — Progress Notes (Signed)
PHARMACIST - PHYSICIAN COMMUNICATION DR:   Venetia Constable CONCERNING: Antibiotic IV to Oral Route Change Policy  RECOMMENDATION: This patient is receiving Levaquin by the intravenous route.  Based on criteria approved by the Pharmacy and Therapeutics Committee, the antibiotic(s) is/are being converted to the equivalent oral dose form(s).   DESCRIPTION: These criteria include:  Patient being treated for a respiratory tract infection, urinary tract infection, cellulitis or clostridium difficile associated diarrhea if on metronidazole  The patient is not neutropenic and does not exhibit a GI malabsorption state  The patient is eating (either orally or via tube) and/or has been taking other orally administered medications for a least 24 hours  The patient is improving clinically and has a Tmax < 100.5  If you have questions about this conversion, please contact the Pharmacy Department    [phone] )  Jeani Hawking   [phone] )  [facility]   [phone] )  [facility]   [phone] )  [facility]   [phone] )  [facility]    Dorna Leitz, PharmD, BCPS 10/10/2014 10:21 AM

## 2014-10-10 NOTE — Progress Notes (Signed)
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 He had CT brain without contrast which showed "Atrophy and chronic ischemic/traumatic changes as detailed above. No evidence of acute intracranial abnormality. No intracranial mass, hemorrhage, or edema. No fracture", Rib Xray which showed "1. No acute displaced right rib fracture identified. Chronic right fourth and eighth rib fractures. 2. Low lung volumes, otherwise no acute cardiopulmonary abnormality". Labs are significant for normal white count, Tegretol level 20, but UA/urine culture is pending. Apparently, patient has been sick for the last couple of days and has been coughing, there has been taking his medications as usual so it appears that he likely developed an acute infection leading to encephalopathy. He will therefore be admitted to the stepdown unit for further workup including UA/urine culture/ammonia level. Will hold Tegretol, give empiric antibodies, incentive spirometry, oxygen supplementation and nicotine patch. Will continue to optimize they clinical history as patient's sensorium improves. The history was limited as there were no family members to corroborate patient's story- which is unreliable as he remains confused yet makes an effort to answer questions. Subjective/Overnight developments 10/09/2014: Ammonia level 41,  chest x-ray shows "Mild interval decrease in the conspicuity of the pulmonary interstitium may reflect resolving interstitial edema or pneumonia. When the patient can tolerate the procedure, a PA and lateral chest x-ray with deep inspiration would be useful". UA/UC unremarkable. Patient coughing, he has pleuritic chest pain/weak cough. He says that Tegretol is the only medication that really helps seizures and he has used it for the past 6 years. Will continue current antibiotics, incentive spirometry, broncho-dilators, oxygen supplementation as needed, add morphine/Percocet for pain, add lactulose and reassess ammonia level, and consult neurology regarding and that convulsants. 10/10/14: Appreciate Neurology. Patient refused blood work this morning. He is willing to have another sample taken. His breathing is better. Will follow neurology recommendations-resume Tegretol once levels normalize, hopefully today. Plan Encephalopathy, metabolic/Tegretol toxicity/COPD with acute exacerbation/S/P cerebral aneurysm repair/Seizures/Tobacco dependency/History of rib fracture/elevated ammonia level/hyponatremia   Follow Neurology recommendations  Resume Tegretol if level therapeutic, which I am assuming it will be later today.  Continue current medications   Mobilize. Chronic liver disease/Chronic hepatitis C without hepatic coma/mild hepatic encephalopathy  Follow ammonia level  Thiamine/folate Essential hypertension, benign  BP not yet optimally controlled.    Add Norvasc DVT/GI Prophylaxis  Lovenox  PPI Family Communication: Discussed plan of care with patient at bedside. No relatives. Code Status   Full Code Likely DC  Home eventually  Consultants:  Neurology  Procedures:  none  Antibiotics:  Levaquin 10/08/2014>  HPI/Subjective: Feels better. Breathing has improved..  Objective: Filed Vitals:   10/09/14 2111  BP: 176/85  Pulse: 64  Temp: 98.8 F (37.1 C)  Resp:  20    Intake/Output Summary (Last 24 hours) at 10/10/14 0925 Last data filed at 10/10/14 0900  Gross per 24 hour  Intake    240 ml  Output   1310 ml  Net  -1070 ml   Filed Weights   10/08/14 1645  Weight:  89.7 kg (197 lb 12 oz)    Exam:   General:  Comfortable at rest.  Cardiovascular: S1-S2 normal. No murmurs. Pulse regular.  Respiratory: Good air entry bilaterally. No rhonchi or rales.  Abdomen: Soft and nontender. Normal bowel sounds. No organomegaly.  Musculoskeletal: No pedal edema   Neurological: Intact  Data Reviewed: Basic Metabolic Panel:  Recent Labs Lab 10/08/14 1142 10/09/14 0340  NA 131* 129*  K 4.1 4.0  CL 99* 97*  CO2 26 25  GLUCOSE 102* 104*  BUN 12 10  CREATININE 0.75 0.64  CALCIUM 8.2* 8.4*  MG  --  1.8  PHOS  --  3.9   Liver Function Tests:  Recent Labs Lab 10/08/14 1142 10/09/14 0340  AST 29 24  ALT 21 18  ALKPHOS 70 70  BILITOT 0.6 0.7  PROT 7.4 6.8  ALBUMIN 3.8 3.4*   No results for input(s): LIPASE, AMYLASE in the last 168 hours.  Recent Labs Lab 10/08/14 1508 10/09/14 0340  AMMONIA 26 41*   CBC:  Recent Labs Lab 10/08/14 1036 10/10/14 0853  WBC 6.3 5.9  NEUTROABS 3.1 2.8  HGB 13.5 14.4  HCT 38.2* 40.7  MCV 98.2 99.3  PLT 148* 166   Cardiac Enzymes: No results for input(s): CKTOTAL, CKMB, CKMBINDEX, TROPONINI in the last 168 hours. BNP (last 3 results) No results for input(s): BNP in the last 8760 hours.  ProBNP (last 3 results) No results for input(s): PROBNP in the last 8760 hours.  CBG:  Recent Labs Lab 10/08/14 1059  GLUCAP 108*  108*    Recent Results (from the past 240 hour(s))  MRSA PCR Screening     Status: None   Collection Time: 10/08/14  4:46 PM  Result Value Ref Range Status   MRSA by PCR NEGATIVE NEGATIVE Final    Comment:        The GeneXpert MRSA Assay (FDA approved for NASAL specimens only), is one component of a comprehensive MRSA colonization surveillance program. It  is not intended to diagnose MRSA infection nor to guide or monitor treatment for MRSA infections.   Urine culture     Status: None (Preliminary result)   Collection Time: 10/08/14  6:03 PM  Result Value Ref Range Status   Specimen Description URINE, CLEAN CATCH  Final   Special Requests NONE  Final   Culture   Final    NO GROWTH < 24 HOURS Performed at [facility], [facility], [facility]    Report Status PENDING  Incomplete     Studies: Dg Ribs Unilateral W/chest Right  10/08/2014   CLINICAL DATA:  54 year old male with seizure yesterday morning and fall. Pain radiating to the right axilla and spine. Initial encounter.  EXAM: RIGHT RIBS AND CHEST - 3+ VIEW  COMPARISON:  Chest and left rib series 6 05/2014.  FINDINGS: Semi upright AP view of the chest. Low lung volumes with diffuse crowding of markings. Stable cardiomegaly and mediastinal contours. No pneumothorax, pleural effusion, or consolidation identified. Small calcified granuloma in the right mid lung is stable.  Chronic right posterior eighth rib fracture with callus. Chronic appearing right lateral fourth rib fracture. No acute displaced right rib fracture identified. Grossly intact visualized spinal vertebrae. No acute osseous abnormality identified.  IMPRESSION: 1. 1. No acute displaced right rib fracture identified. Chronic right fourth and eighth rib fractures. 2. Low lung volumes, otherwise no acute cardiopulmonary abnormality.   Electronically Signed   By: Odessa Fleming M.D.   On: 10/08/2014 12:11   Ct  Head Wo Contrast  10/08/2014   CLINICAL DATA:  Unwitnessed seizure yesterday and this morning. Fall with seizure yesterday. ETOH, hepatitis-C, hypertension.  EXAM: CT HEAD WITHOUT CONTRAST  TECHNIQUE: Contiguous axial images were obtained from the base of the skull through the vertex without intravenous contrast.  COMPARISON:  Head CT dated 08/22/2014.  FINDINGS: Again noted is mild generalized brain atrophy with commensurate dilatation of the  ventricles and sulci. Also again noted is bifrontal encephalomalacia, left greater than right, suggestive of a previous traumatic brain injury. Minimal chronic small vessel ischemic change noted within the deep periventricular white matter and there is a small old lacunar infarct within the right basal ganglia region.  There is no mass, hemorrhage, edema, or other evidence of acute parenchymal abnormality. No extra-axial hemorrhage. No acute osseous fracture or dislocation. Old displaced nasal bone fractures are stable in alignment.  IMPRESSION: Atrophy and chronic ischemic/traumatic changes as detailed above.  No evidence of acute intracranial abnormality. No intracranial mass, hemorrhage, or edema. No fracture.   Electronically Signed   By: Bary Richard M.D.   On: 10/08/2014 13:35   Dg Chest Port 1 View  10/09/2014   CLINICAL DATA:  Cough and congestion common no clinical improvement reported by the patient.  EXAM: PORTABLE CHEST - 1 VIEW  COMPARISON:  Chest x-ray of October 08, 2014  FINDINGS: The lungs are adequately inflated. The interstitial markings remain increased but have improved since yesterday's study. There is no pleural effusion. The heart and pulmonary vascularity are normal. There is tortuosity of the ascending and descending thoracic aorta. The bony thorax is unremarkable.  IMPRESSION: Mild interval decrease in the conspicuity of the pulmonary interstitium may reflect resolving interstitial edema or pneumonia. When the patient can tolerate the procedure, a PA and lateral chest x-ray with deep inspiration would be useful.   Electronically Signed   By: David  [place] M.D.   On: 10/09/2014 08:38   Dg Abd 2 Views  10/10/2014   CLINICAL DATA:  Acute onset of vomiting.  Initial encounter.  EXAM: ABDOMEN - 2 VIEW  COMPARISON:  MRI of the lumbar spine performed 01/23/2014  FINDINGS: The visualized bowel gas pattern is unremarkable. The colon is largely filled with air; no abnormal dilatation of small  bowel loops is seen to suggest small bowel obstruction. No free intra-abdominal air is identified on the provided upright view.  The visualized osseous structures are within normal limits; the sacroiliac joints are unremarkable in appearance. Scattered atelectasis is noted at the lung bases.  IMPRESSION: 1. Unremarkable bowel gas pattern. Colon largely filled with air; no evidence for bowel obstruction. No free intra-abdominal air seen. 2. Scattered atelectasis at the lung bases.   Electronically Signed   By: Roanna Raider M.D.   On: 10/10/2014 04:27    Scheduled Meds: . amLODipine  5 mg Oral Daily  . folic acid  1 mg Oral Daily  . lactulose  20 g Oral BID  . levETIRAcetam  1,000 mg Oral BID  . levofloxacin (LEVAQUIN) IV  750 mg Intravenous Q24H  . metoprolol  25 mg Oral BID  . nicotine  21 mg Transdermal Daily  . pantoprazole  40 mg Oral Daily  . pneumococcal 23 valent vaccine  0.5 mL Intramuscular Tomorrow-1000  . pregabalin  75 mg Oral BID  . thiamine  100 mg Oral Daily   Continuous Infusions:    Time spent: 25 minutes    Abigaile Rossie  [facility] Pager [phone]. If 7PM-7AM, please contact night-coverage  at [email or web address], password [facility] 10/10/2014, 9:25 AM  LOS: 2 days

## 2014-10-10 NOTE — Evaluation (Signed)
Physical Therapy Evaluation Patient Details Name: [name] [name] MRN: [id number] DOB: February 02, 1961 Today's Date: 10/10/2014   History of Present Illness  Pt admitted through ED with c/o back pain following seizure and fall.  Dx acute metabolic encephalopathy  Clinical Impression  Pt admitted as above and presenting with functional mobility limitations 2* generalized weakness, ambulatory balance deficits and questionable safety awareness.  Pt is currently residing with room mate who does not assist and would benefit from follow up rehab at SNF level to maximize safety and independence.    Follow Up Recommendations SNF    Equipment Recommendations  Rolling walker with 5" wheels    Recommendations for Other Services OT consult     Precautions / Restrictions Precautions Precautions: Fall Restrictions Weight Bearing Restrictions: No      Mobility  Bed Mobility Overal bed mobility: Modified Independent                Transfers Overall transfer level: Needs assistance Equipment used: Rolling walker (2 wheeled) Transfers: Sit to/from Stand Sit to Stand: Min assist         General transfer comment: cues for saftey with transition and use of UEs to self assist  Ambulation/Gait Ambulation/Gait assistance: Min assist Ambulation Distance (Feet): 180 Feet Assistive device: Rolling walker (2 wheeled) Gait Pattern/deviations: Step-through pattern;Decreased step length - right;Decreased step length - left;Shuffle;Trunk flexed;Narrow base of support Gait velocity: decr   General Gait Details: cues for posture, position from RW, to increase BOS and basic saftey awareness  Stairs            Wheelchair Mobility    Modified Rankin (Stroke Patients Only)       Balance Overall balance assessment: Needs assistance Sitting-balance support: No upper extremity supported;Feet supported Sitting balance-[name]: Good     Standing balance support: Single extremity  supported Standing balance-[name]: Fair                               Pertinent Vitals/Pain Pain Assessment: Faces Faces Pain Scale: Hurts little more Pain Location: R lateral and post rib cage  Pain Descriptors / Indicators: Sore Pain Intervention(s): Limited activity within patient's tolerance;Monitored during session    Home Living Family/patient expects to be discharged to:: Private residence Living Arrangements: Alone Available Help at Discharge: Family;Available PRN/intermittently Type of Home: Apartment Home Access: Stairs to enter   Entrance Stairs-Number of Steps: 7 Home Layout: One level Home Equipment: Cane - single point      Prior Function Level of Independence: Independent with assistive device(s)         Comments: Hx of falls at home     Hand Dominance        Extremity/Trunk Assessment   Upper Extremity Assessment: Generalized weakness           Lower Extremity Assessment: Generalized weakness;RLE deficits/detail;LLE deficits/detail      Cervical / Trunk Assessment: Normal  Communication   Communication: No difficulties  Cognition Arousal/Alertness: Awake/alert Behavior During Therapy: WFL for tasks assessed/performed Overall Cognitive Status: No family/caregiver present to determine baseline cognitive functioning (Pt highly distractable and mildly impulsive)                      General Comments      Exercises        Assessment/Plan    PT Assessment Patient needs continued PT services  PT Diagnosis Difficulty walking   PT Problem  List Decreased strength;Decreased activity tolerance;Decreased balance;Decreased mobility;Decreased knowledge of use of DME;Decreased safety awareness;Decreased cognition;Pain  PT Treatment Interventions DME instruction;Gait training;Stair training;Functional mobility training;Therapeutic activities;Therapeutic exercise;Cognitive remediation;Patient/family education;Balance  training   PT Goals (Current goals can be found in the Care Plan section) Acute Rehab PT Goals Patient Stated Goal: I need to find another place to stay PT Goal Formulation: With patient Time For Goal Achievement: 10/24/14 Potential to Achieve Goals: Fair    Frequency Min 3X/week   Barriers to discharge Decreased caregiver support Pt states lives with room mate who does not assist and rarely even speaks to him    Co-evaluation               End of Session Equipment Utilized During Treatment: Gait belt Activity Tolerance: Patient tolerated treatment well Patient left: in bed;with call bell/phone within reach;with nursing/sitter in room Nurse Communication: Mobility status         Time: 1457-1535 PT Time Calculation (min) (ACUTE ONLY): 38 min   Charges:   PT Evaluation $Initial PT Evaluation Tier I: 1 Procedure PT Treatments $Gait Training: 8-22 mins   PT G Codes:        Othelia Riederer 10/19/2014, 4:07 PM

## 2014-10-10 NOTE — Progress Notes (Signed)
Subjective: No further seizures.  Feeling normal.  He states he had run out of Tegretol for a few days and when it was refilled he took the extra doses he missed thinking it would keep him from having a seizure. He now realizes this was the wrong thing to do.    Objective: Current vital signs: BP 176/85 mmHg  Pulse 64  Temp(Src) 98.8 F (37.1 C) (Oral)  Resp 20  Ht 6\' 2"  (1.88 m)  Wt 89.7 kg (197 lb 12 oz)  BMI 25.38 kg/m2  SpO2 98% Vital signs in last 24 hours: Temp:  [97.7 F (36.5 C)-98.8 F (37.1 C)] 98.8 F (37.1 C) (07/21 2111) Pulse Rate:  [64-68] 64 (07/21 2111) Resp:  [17-20] 20 (07/21 2111) BP: (153-176)/(79-89) 176/85 mmHg (07/21 2111) SpO2:  [94 %-98 %] 98 % (07/21 2111)  Intake/Output from previous day: 07/21 0701 - 07/22 0700 In: 240 [P.O.:240] Out: 1510 [Urine:1510] Intake/Output this shift:   Nutritional status: Diet Heart Room service appropriate?: Yes; Fluid consistency:: Thin  Neurologic Exam: Mental Status: Drowsy, oriented 3. Speech slightly slurred but blames that on his new dentures without evidence of aphasia. Able to follow commands without difficulty. Cranial Nerves: II-Visual fields were normal. III/IV/VI-Pupils were equal and reacted normally to light. Extraocular movements were full and conjugate.  V/VII-no facial numbness and no facial weakness. VIII-normal. Motor: 5/5 bilaterally with normal tone and bulk Sensory: Normal throughout. Deep Tendon Reflexes: 1+ and symmetric in upper extremities and trace only at the knees.   Lab Results: Basic Metabolic Panel:  Recent Labs Lab 10/08/14 1142 10/09/14 0340  NA 131* 129*  K 4.1 4.0  CL 99* 97*  CO2 26 25  GLUCOSE 102* 104*  BUN 12 10  CREATININE 0.75 0.64  CALCIUM 8.2* 8.4*  MG  --  1.8  PHOS  --  3.9    Liver Function Tests:  Recent Labs Lab 10/08/14 1142 10/09/14 0340  AST 29 24  ALT 21 18  ALKPHOS 70 70  BILITOT 0.6 0.7  PROT 7.4 6.8  ALBUMIN 3.8 3.4*   No  results for input(s): LIPASE, AMYLASE in the last 168 hours.  Recent Labs Lab 10/08/14 1508 10/09/14 0340  AMMONIA 26 41*    CBC:  Recent Labs Lab 10/08/14 1036  WBC 6.3  NEUTROABS 3.1  HGB 13.5  HCT 38.2*  MCV 98.2  PLT 148*    Cardiac Enzymes: No results for input(s): CKTOTAL, CKMB, CKMBINDEX, TROPONINI in the last 168 hours.  Lipid Panel: No results for input(s): CHOL, TRIG, HDL, CHOLHDL, VLDL, LDLCALC in the last 168 hours.  CBG:  Recent Labs Lab 10/08/14 1059  GLUCAP 108*  108*    Microbiology: Results for orders placed or performed during the hospital encounter of 10/08/14  MRSA PCR Screening     Status: None   Collection Time: 10/08/14  4:46 PM  Result Value Ref Range Status   MRSA by PCR NEGATIVE NEGATIVE Final    Comment:        The GeneXpert MRSA Assay (FDA approved for NASAL specimens only), is one component of a comprehensive MRSA colonization surveillance program. It is not intended to diagnose MRSA infection nor to guide or monitor treatment for MRSA infections.   Urine culture     Status: None (Preliminary result)   Collection Time: 10/08/14  6:03 PM  Result Value Ref Range Status   Specimen Description URINE, CLEAN CATCH  Final   Special Requests NONE  Final   Culture  Final    NO GROWTH < 24 HOURS Performed at [facility]    Report Status PENDING  Incomplete    Coagulation Studies:  Recent Labs  10/09/14 0340  LABPROT 14.9  INR 1.16    Imaging: Dg Ribs Unilateral W/chest Right  10/08/2014   CLINICAL DATA:  54 year old male with seizure yesterday morning and fall. Pain radiating to the right axilla and spine. Initial encounter.  EXAM: RIGHT RIBS AND CHEST - 3+ VIEW  COMPARISON:  Chest and left rib series 6 05/2014.  FINDINGS: Semi upright AP view of the chest. Low lung volumes with diffuse crowding of markings. Stable cardiomegaly and mediastinal contours. No pneumothorax, pleural effusion, or consolidation  identified. Small calcified granuloma in the right mid lung is stable.  Chronic right posterior eighth rib fracture with callus. Chronic appearing right lateral fourth rib fracture. No acute displaced right rib fracture identified. Grossly intact visualized spinal vertebrae. No acute osseous abnormality identified.  IMPRESSION: 1. 1. No acute displaced right rib fracture identified. Chronic right fourth and eighth rib fractures. 2. Low lung volumes, otherwise no acute cardiopulmonary abnormality.   Electronically Signed   By: Odessa Fleming M.D.   On: 10/08/2014 12:11   Ct Head Wo Contrast  10/08/2014   CLINICAL DATA:  Unwitnessed seizure yesterday and this morning. Fall with seizure yesterday. ETOH, hepatitis-C, hypertension.  EXAM: CT HEAD WITHOUT CONTRAST  TECHNIQUE: Contiguous axial images were obtained from the base of the skull through the vertex without intravenous contrast.  COMPARISON:  Head CT dated 08/22/2014.  FINDINGS: Again noted is mild generalized brain atrophy with commensurate dilatation of the ventricles and sulci. Also again noted is bifrontal encephalomalacia, left greater than right, suggestive of a previous traumatic brain injury. Minimal chronic small vessel ischemic change noted within the deep periventricular white matter and there is a small old lacunar infarct within the right basal ganglia region.  There is no mass, hemorrhage, edema, or other evidence of acute parenchymal abnormality. No extra-axial hemorrhage. No acute osseous fracture or dislocation. Old displaced nasal bone fractures are stable in alignment.  IMPRESSION: Atrophy and chronic ischemic/traumatic changes as detailed above.  No evidence of acute intracranial abnormality. No intracranial mass, hemorrhage, or edema. No fracture.   Electronically Signed   By: Bary Richard M.D.   On: 10/08/2014 13:35   Dg Chest Port 1 View  10/09/2014   CLINICAL DATA:  Cough and congestion common no clinical improvement reported by the  patient.  EXAM: PORTABLE CHEST - 1 VIEW  COMPARISON:  Chest x-ray of October 08, 2014  FINDINGS: The lungs are adequately inflated. The interstitial markings remain increased but have improved since yesterday's study. There is no pleural effusion. The heart and pulmonary vascularity are normal. There is tortuosity of the ascending and descending thoracic aorta. The bony thorax is unremarkable.  IMPRESSION: Mild interval decrease in the conspicuity of the pulmonary interstitium may reflect resolving interstitial edema or pneumonia. When the patient can tolerate the procedure, a PA and lateral chest x-ray with deep inspiration would be useful.   Electronically Signed   By: Briyonna Omara  [place] M.D.   On: 10/09/2014 08:38   Dg Abd 2 Views  10/10/2014   CLINICAL DATA:  Acute onset of vomiting.  Initial encounter.  EXAM: ABDOMEN - 2 VIEW  COMPARISON:  MRI of the lumbar spine performed 01/23/2014  FINDINGS: The visualized bowel gas pattern is unremarkable. The colon is largely filled with air; no abnormal dilatation of small bowel loops  is seen to suggest small bowel obstruction. No free intra-abdominal air is identified on the provided upright view.  The visualized osseous structures are within normal limits; the sacroiliac joints are unremarkable in appearance. Scattered atelectasis is noted at the lung bases.  IMPRESSION: 1. Unremarkable bowel gas pattern. Colon largely filled with air; no evidence for bowel obstruction. No free intra-abdominal air seen. 2. Scattered atelectasis at the lung bases.   Electronically Signed   By: Roanna Raider M.D.   On: 10/10/2014 04:27    Medications:  Scheduled: . folic acid  1 mg Oral Daily  . lactulose  20 g Oral BID  . levETIRAcetam  1,000 mg Oral BID  . levofloxacin (LEVAQUIN) IV  750 mg Intravenous Q24H  . metoprolol  25 mg Oral BID  . nicotine  21 mg Transdermal Daily  . pantoprazole  40 mg Oral Daily  . pneumococcal 23 valent vaccine  0.5 mL Intramuscular Tomorrow-1000  .  pregabalin  75 mg Oral BID  . thiamine  100 mg Oral Daily    Assessment/Plan: Tegretol toxicity secondary to medication non compliance. HE has been doing well on his Tegretol and his Sodium is mildly hyponatremic.  At this time would not change his dose of Tegretol.  Would recommend obtaining Tegretol level and once he is therapeutic restart him on his home dose of tegretol.  He is follow by Dr. Melburn Popper and will need to follow up out patietn with him.  Dr. Anne Hahn is aware he is in the hospital. Neurology S/O       Felicie Morn PA-C [facility] [phone]  10/10/2014, 8:55 AM

## 2014-10-10 NOTE — Progress Notes (Signed)
Received report on patient.  Patient returning to room after walking in hall with PT.  Reports no needs.  Room air.  IV intact and fluids infusing.  Safety sitter in room.  Denies pain.  Respirations even and unlabored.

## 2014-10-10 NOTE — Progress Notes (Signed)
Pt. Had 2 episodes of vomiting during shift the first at the beginning of shift pt projectile vomited milk and food particles zofran was given, later in shift pt vomited green bile looking emesis and was complaining of abdominal pain. Dr. Elyn Peers made aware ordered an abdominal x-ray. Will continue to monitor.

## 2014-10-11 ENCOUNTER — Encounter (HOSPITAL_COMMUNITY): Payer: Self-pay | Admitting: Radiology

## 2014-10-11 ENCOUNTER — Inpatient Hospital Stay (HOSPITAL_COMMUNITY): Payer: Medicaid Other

## 2014-10-11 LAB — CBC
HEMATOCRIT: 36.5 % — AB (ref 39.0–52.0)
Hemoglobin: 12.7 g/dL — ABNORMAL LOW (ref 13.0–17.0)
MCH: 34.7 pg — ABNORMAL HIGH (ref 26.0–34.0)
MCHC: 34.8 g/dL (ref 30.0–36.0)
MCV: 99.7 fL (ref 78.0–100.0)
Platelets: 172 10*3/uL (ref 150–400)
RBC: 3.66 MIL/uL — ABNORMAL LOW (ref 4.22–5.81)
RDW: 11.9 % (ref 11.5–15.5)
WBC: 5.3 10*3/uL (ref 4.0–10.5)

## 2014-10-11 LAB — BASIC METABOLIC PANEL
ANION GAP: 7 (ref 5–15)
BUN: 7 mg/dL (ref 6–20)
CO2: 28 mmol/L (ref 22–32)
Calcium: 8.6 mg/dL — ABNORMAL LOW (ref 8.9–10.3)
Chloride: 98 mmol/L — ABNORMAL LOW (ref 101–111)
Creatinine, Ser: 0.71 mg/dL (ref 0.61–1.24)
GFR calc non Af Amer: >60 mL/min (ref >60)
Glucose, Bld: 99 mg/dL (ref 65–99)
Potassium: 4.2 mmol/L (ref 3.5–5.1)
SODIUM: 133 mmol/L — AB (ref 135–145)

## 2014-10-11 LAB — CARBAMAZEPINE LEVEL, TOTAL: Carbamazepine Lvl: 9 ug/mL (ref 4.0–12.0)

## 2014-10-11 MED ORDER — IOHEXOL 300 MG/ML  SOLN: 100.0000 mL | Freq: Once | INTRAMUSCULAR | Status: AC | PRN

## 2014-10-11 MED ORDER — KETOROLAC TROMETHAMINE 30 MG/ML IJ SOLN
30.0000 mg | Freq: Once | INTRAMUSCULAR | Status: AC
  Administered 2014-10-11: 30 mg via INTRAVENOUS
  Filled 2014-10-11: qty 1

## 2014-10-11 NOTE — Progress Notes (Signed)
Report received from T. Nicoles, RN. No change from initial pm assessment. Will continue to monitor and follow the POC.  

## 2014-10-11 NOTE — Progress Notes (Signed)
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 He had CT brain without contrast which showed "Atrophy and chronic ischemic/traumatic changes as detailed above. No evidence of acute intracranial abnormality. No intracranial mass, hemorrhage, or edema. No fracture", Rib Xray which showed "1. No acute displaced right rib fracture identified. Chronic right fourth and eighth rib fractures. 2. Low lung volumes, otherwise no acute cardiopulmonary abnormality". Labs are significant for normal white count, Tegretol level 20, but UA/urine culture is pending. Apparently, patient has been sick for the last couple of days and has been coughing, there has been taking his medications as usual so it appears that he likely developed an acute infection leading to encephalopathy. He will therefore be admitted to the stepdown unit for further workup including UA/urine culture/ammonia level. Will hold Tegretol, give empiric antibodies, incentive spirometry, oxygen supplementation and nicotine patch. Will continue to optimize they clinical history as patient's sensorium improves. The history was limited as there were no family members to corroborate patient's story- which is unreliable as he remains confused yet makes an effort to answer questions. Subjective/Overnight developments 10/09/2014: Ammonia level 41,  chest x-ray shows "Mild interval decrease in the conspicuity of the pulmonary interstitium may reflect resolving interstitial edema or pneumonia. When the patient can tolerate the procedure, a PA and lateral chest x-ray with deep inspiration would be useful". UA/UC unremarkable. Patient coughing, he has pleuritic chest pain/weak cough. He says that Tegretol is the only medication that really helps seizures and he has used it for the past 6 years. Will continue current antibiotics, incentive spirometry, broncho-dilators, oxygen supplementation as needed, add morphine/Percocet for pain, add lactulose and reassess ammonia level, and consult neurology regarding and that convulsants. 10/10/14: Appreciate Neurology. Patient refused blood work this morning. He is willing to have another sample taken. His breathing is better. Will follow neurology recommendations-resume Tegretol once levels normalize, hopefully today. 10/11/2014: Patient had CT chest abdomen and pelvis in view of persistent abdominal pain. Reviewed results. Has broken ribs with atelectasis on the right side. We'll continue current management. Tegretol level has normalized. Plan Encephalopathy, metabolic/Tegretol toxicity/COPD with acute exacerbation/S/P cerebral aneurysm repair/Seizures/Tobacco dependency/History of rib fracture/elevated ammonia level/hyponatremia   Follow Neurology recommendations  Continue Tegretol   Mobilize. Chronic liver disease/Chronic hepatitis C without hepatic coma/mild hepatic encephalopathy  Ammonia level normal  Thiamine/folate Essential hypertension, benign  BP better controlled.   Continue Norvasc DVT/GI Prophylaxis  Lovenox  PPI Family Communication: Discussed plan of care with patient at bedside. No relatives. Code Status   Full Code Likely DC  Home eventually  Consultants:  Neurology  Procedures:  none  Antibiotics:  Levaquin 10/08/2014>  HPI/Subjective: Feels better. Still  has abdominal pain.  Objective: Filed Vitals:   10/11/14 1410  BP: 140/89  Pulse: 59  Temp: 97.5 F (36.4 C)  Resp: 18    Intake/Output Summary (Last 24 hours) at 10/11/14 2049 Last data filed at 10/11/14 1700  Gross per 24 hour  Intake    720 ml  Output   2050 ml  Net  -1330 ml   Filed Weights   10/08/14 1645  Weight: 89.7 kg (197 lb 12 oz)    Exam:   General:  Comfortable at rest.  Cardiovascular: S1-S2 normal. No murmurs. Pulse regular.  Respiratory: Good air entry bilaterally. No rhonchi or rales.  Abdomen: Soft and nontender. Normal bowel sounds. No organomegaly.  Musculoskeletal: No pedal edema   Neurological: Intact  Data Reviewed: Basic Metabolic Panel:  Recent Labs Lab 10/08/14 1142 10/09/14 0340 10/10/14 0853 10/11/14 0531  NA 131* 129* 136 133*  K 4.1 4.0 4.3 4.2  CL 99* 97* 101 98*  CO2 26 25 28 28   GLUCOSE 102* 104* 105* 99  BUN 12 10 <5* 7  CREATININE 0.75 0.64 0.72 0.71  CALCIUM 8.2* 8.4* 9.2 8.6*  MG  --  1.8  --   --   PHOS  --  3.9  --   --    Liver Function Tests:  Recent Labs Lab 10/08/14 1142 10/09/14 0340 10/10/14 0853  AST 29 24 25   ALT 21 18 18   ALKPHOS 70 70 78  BILITOT 0.6 0.7 0.8  PROT 7.4 6.8 8.1  ALBUMIN 3.8 3.4* 3.9   No results for input(s): LIPASE, AMYLASE in the last 168 hours.  Recent Labs Lab 10/08/14 1508 10/09/14 0340 10/10/14 0853  AMMONIA 26 41* 17   CBC:  Recent Labs Lab 10/08/14 1036 10/10/14 0853 10/11/14 0531  WBC 6.3 5.9 5.3  NEUTROABS 3.1 2.8  --   HGB 13.5 14.4 12.7*  HCT 38.2* 40.7 36.5*  MCV 98.2 99.3 99.7  PLT 148* 166 172   Cardiac Enzymes: No results for input(s): CKTOTAL, CKMB, CKMBINDEX, TROPONINI in the last 168 hours. BNP (last 3 results) No results for input(s): BNP in the last 8760 hours.  ProBNP (last 3 results) No results for input(s): PROBNP in the last 8760 hours.  CBG:  Recent Labs Lab 10/08/14 1059  GLUCAP 108*  108*    Recent Results (from  the past 240 hour(s))  MRSA PCR Screening     Status: None   Collection Time: 10/08/14  4:46 PM  Result Value Ref Range Status   MRSA by PCR NEGATIVE NEGATIVE Final    Comment:        The GeneXpert MRSA Assay (FDA approved for NASAL specimens only), is one component of a comprehensive MRSA colonization surveillance program. It is not intended to diagnose MRSA infection nor to guide or monitor treatment for MRSA infections.   Urine culture     Status: None   Collection Time: 10/08/14  6:03 PM  Result Value Ref Range Status   Specimen Description URINE, CLEAN CATCH  Final   Special Requests NONE  Final   Culture   Final    MULTIPLE SPECIES PRESENT, SUGGEST RECOLLECTION Performed at [facility]    Report Status 10/10/2014 FINAL  Final     Studies: Ct Chest W Contrast  10/11/2014   CLINICAL DATA:  54 year old who has had multiple recent falls and presents with back pain and acute onset of chest pain chest pain which began yesterday associated with coughing and generalized weakness. Chronic bilateral lower quadrant abdominal pain for several weeks. Current history of hepatitis-C, hypertension and COPD.  EXAM: CT CHEST, ABDOMEN AND PELVIS WITHOUT CONTRAST  TECHNIQUE: Multidetector CT imaging of the chest, abdomen and pelvis was performed following the standard protocol without IV contrast.  COMPARISON:  None.  FINDINGS: CT CHEST FINDINGS  Lungs: Calcified granuloma in the peripheral  right upper lobe. No noncalcified pulmonary parenchymal nodules or masses. Mild passive atelectasis in the right lower lobe. Linear atelectasis in the superior segment right lower lobe and in the lingula. Lungs otherwise clear without airspace consolidation or interstitial lung disease.  Trachea/bronchi: Central airways patent without significant bronchial wall thickening.  Pleura:  Small right pleural effusion.  No left pleural effusion.  Mediastinum: No mediastinal masses. Minimal residual thymic tissue  in the anterior superior mediastinum.  Heart and Vascular: Heart size upper normal with moderate LAD coronary atherosclerosis and evidence of left ventricular hypertrophy. No pericardial effusion. Mild to moderate atherosclerosis involving the thoracic aorta without aneurysm or dissection. Widely patent proximal great vessels.  Lymphatic: No pathologic mediastinal, hilar or axillary lymphadenopathy.  Other findings: Mild bilateral gynecomastia.  Visualized lower neck: Visualized thyroid gland normal in size and appearance without nodularity. No supraclavicular lymphadenopathy.  Visualized upper abdomen: See report of CT abdomen and pelvis below.  Musculoskeletal: Acute fractures involving the right posterior 10th, 11th and 12th ribs. Old healed fracture involving the right posterior 8th rib. Subacute fractures with early healing involving the anterior left 3rd, 5th, 6th, 7th, and 8th ribs. No fractures involving the thoracic spine.  CT ABDOMEN AND PELVIS FINDINGS  Hepatobiliary: Mild hepatomegaly with relative enlargement of the left lobe and caudate lobe compared to the right lobe. Mildly irregular hepatic contour. No focal hepatic parenchymal abnormality. Normal-appearing gallbladder without calcified gallstones. No biliary ductal dilation.  Spleen:  Normal in size and appearance.  Pancreas:  Normal in appearance.  No pancreatic ductal dilation.  Adrenal glands:  Normal in appearance.  Genitourinary: Both kidneys normal in size and appearance. No evidence of urinary tract calculi. Normal-appearing decompressed urinary bladder.  Upper normal sized prostate gland, particularly the median lobe. Normal seminal vesicles.  Gastrointestinal: Stomach normal in appearance, filled with food. Wall thickening and luminal narrowing involving a several cm segment of the distal and terminal ileum, without evidence of edema or inflammation in the surrounding fat. Remaining small bowel unremarkable. He elongated sigmoid colon  extending well above the umbilicus in the abdominal midline. Moderate stool burden with both liquid and solid stool. No evidence of diverticulosis and no focal abnormality involving the colon. Cecum extends lobe in the right side of the pelvis. Normal-appearing long appendix identified in the right lower pelvis extending upward into the upper pelvis near the midline.  Ascites:  Absent.  Vascular: Severe aortoiliofemoral atherosclerosis without aneurysm. Visceral arteries patent.  Lymphatic:  No pathologic lymphadenopathy in the abdomen or pelvis.  Other findings: Edema or ecchymosis involving the subcutaneous fat of the right flank.  Musculoskeletal: Severe degenerative disc disease and spondylosis at L3-4, L4-5 and L5-S1 with facet degenerative changes at these levels. No fractures involving the lumbosacral spine or the pelvis.  Visualized lower thorax: See above report for the CT chest.  IMPRESSION: 1. Small right pleural effusion and mild passive atelectasis involving the right lower lobe. Linear atelectasis involving the superior segment right lower lobe and lingula. No acute cardiopulmonary disease otherwise. 2. Very small calcified granuloma in the posterior right upper lobe. 3. Hypertrophy and moderate LAD coronary atherosclerosis. 4. Mild bilateral gynecomastia. 5. No acute abnormalities involving the abdomen or pelvis. 6. Hepatic cirrhosis.  No focal hepatic parenchymal abnormality. 7. Wall thickening involving a several cm segment of the distal and terminal ileum with luminal narrowing but no evidence of bowel obstruction. As there is no associated edema or inflammation in the surrounding fat, this likely represents chronic inflammation. 8. Multiple  bilateral rib fractures of differing stages of healing, including acute fractures involving the posterior right tenth, eleventh and twelfth ribs. Other osseous findings as above. 9. Generalized atherosclerosis as described above, advanced for age.    Electronically Signed   By: Hulan Saas M.D.   On: 10/11/2014 18:30   Ct Abdomen Pelvis W Contrast  10/11/2014   CLINICAL DATA:  54 year old who has had multiple recent falls and presents with back pain and acute onset of chest pain chest pain which began yesterday associated with coughing and generalized weakness. Chronic bilateral lower quadrant abdominal pain for several weeks. Current history of hepatitis-C, hypertension and COPD.  EXAM: CT CHEST, ABDOMEN AND PELVIS WITHOUT CONTRAST  TECHNIQUE: Multidetector CT imaging of the chest, abdomen and pelvis was performed following the standard protocol without IV contrast.  COMPARISON:  None.  FINDINGS: CT CHEST FINDINGS  Lungs: Calcified granuloma in the peripheral right upper lobe. No noncalcified pulmonary parenchymal nodules or masses. Mild passive atelectasis in the right lower lobe. Linear atelectasis in the superior segment right lower lobe and in the lingula. Lungs otherwise clear without airspace consolidation or interstitial lung disease.  Trachea/bronchi: Central airways patent without significant bronchial wall thickening.  Pleura:  Small right pleural effusion.  No left pleural effusion.  Mediastinum: No mediastinal masses. Minimal residual thymic tissue in the anterior superior mediastinum.  Heart and Vascular: Heart size upper normal with moderate LAD coronary atherosclerosis and evidence of left ventricular hypertrophy. No pericardial effusion. Mild to moderate atherosclerosis involving the thoracic aorta without aneurysm or dissection. Widely patent proximal great vessels.  Lymphatic: No pathologic mediastinal, hilar or axillary lymphadenopathy.  Other findings: Mild bilateral gynecomastia.  Visualized lower neck: Visualized thyroid gland normal in size and appearance without nodularity. No supraclavicular lymphadenopathy.  Visualized upper abdomen: See report of CT abdomen and pelvis below.  Musculoskeletal: Acute fractures involving the  right posterior 10th, 11th and 12th ribs. Old healed fracture involving the right posterior 8th rib. Subacute fractures with early healing involving the anterior left 3rd, 5th, 6th, 7th, and 8th ribs. No fractures involving the thoracic spine.  CT ABDOMEN AND PELVIS FINDINGS  Hepatobiliary: Mild hepatomegaly with relative enlargement of the left lobe and caudate lobe compared to the right lobe. Mildly irregular hepatic contour. No focal hepatic parenchymal abnormality. Normal-appearing gallbladder without calcified gallstones. No biliary ductal dilation.  Spleen:  Normal in size and appearance.  Pancreas:  Normal in appearance.  No pancreatic ductal dilation.  Adrenal glands:  Normal in appearance.  Genitourinary: Both kidneys normal in size and appearance. No evidence of urinary tract calculi. Normal-appearing decompressed urinary bladder.  Upper normal sized prostate gland, particularly the median lobe. Normal seminal vesicles.  Gastrointestinal: Stomach normal in appearance, filled with food. Wall thickening and luminal narrowing involving a several cm segment of the distal and terminal ileum, without evidence of edema or inflammation in the surrounding fat. Remaining small bowel unremarkable. He elongated sigmoid colon extending well above the umbilicus in the abdominal midline. Moderate stool burden with both liquid and solid stool. No evidence of diverticulosis and no focal abnormality involving the colon. Cecum extends lobe in the right side of the pelvis. Normal-appearing long appendix identified in the right lower pelvis extending upward into the upper pelvis near the midline.  Ascites:  Absent.  Vascular: Severe aortoiliofemoral atherosclerosis without aneurysm. Visceral arteries patent.  Lymphatic:  No pathologic lymphadenopathy in the abdomen or pelvis.  Other findings: Edema or ecchymosis involving the subcutaneous fat of the right flank.  Musculoskeletal: Severe degenerative disc disease and  spondylosis at L3-4, L4-5 and L5-S1 with facet degenerative changes at these levels. No fractures involving the lumbosacral spine or the pelvis.  Visualized lower thorax: See above report for the CT chest.  IMPRESSION: 1. Small right pleural effusion and mild passive atelectasis involving the right lower lobe. Linear atelectasis involving the superior segment right lower lobe and lingula. No acute cardiopulmonary disease otherwise. 2. Very small calcified granuloma in the posterior right upper lobe. 3. Hypertrophy and moderate LAD coronary atherosclerosis. 4. Mild bilateral gynecomastia. 5. No acute abnormalities involving the abdomen or pelvis. 6. Hepatic cirrhosis.  No focal hepatic parenchymal abnormality. 7. Wall thickening involving a several cm segment of the distal and terminal ileum with luminal narrowing but no evidence of bowel obstruction. As there is no associated edema or inflammation in the surrounding fat, this likely represents chronic inflammation. 8. Multiple bilateral rib fractures of differing stages of healing, including acute fractures involving the posterior right tenth, eleventh and twelfth ribs. Other osseous findings as above. 9. Generalized atherosclerosis as described above, advanced for age.   Electronically Signed   By: Hulan Saas M.D.   On: 10/11/2014 18:30   Dg Abd 2 Views  10/10/2014   CLINICAL DATA:  Acute onset of vomiting.  Initial encounter.  EXAM: ABDOMEN - 2 VIEW  COMPARISON:  MRI of the lumbar spine performed 01/23/2014  FINDINGS: The visualized bowel gas pattern is unremarkable. The colon is largely filled with air; no abnormal dilatation of small bowel loops is seen to suggest small bowel obstruction. No free intra-abdominal air is identified on the provided upright view.  The visualized osseous structures are within normal limits; the sacroiliac joints are unremarkable in appearance. Scattered atelectasis is noted at the lung bases.  IMPRESSION: 1. Unremarkable  bowel gas pattern. Colon largely filled with air; no evidence for bowel obstruction. No free intra-abdominal air seen. 2. Scattered atelectasis at the lung bases.   Electronically Signed   By: Roanna Raider M.D.   On: 10/10/2014 04:27    Scheduled Meds: . amLODipine  5 mg Oral Daily  . carbamazepine  400 mg Oral TID  . folic acid  1 mg Oral Daily  . levETIRAcetam  1,000 mg Oral BID  . levofloxacin  750 mg Oral q1800  . metoprolol  25 mg Oral BID  . nicotine  21 mg Transdermal Daily  . pantoprazole  40 mg Oral Daily  . pneumococcal 23 valent vaccine  0.5 mL Intramuscular Tomorrow-1000  . pregabalin  75 mg Oral BID  . thiamine  100 mg Oral Daily   Continuous Infusions:    Time spent: 25 minutes    [name] [name]  [facility] Pager [phone]. If 7PM-7AM, please contact night-coverage at [email or web address], password [facility] 10/11/2014, 8:49 PM  LOS: 3 days

## 2014-10-12 LAB — COMPREHENSIVE METABOLIC PANEL
ALK PHOS: 69 U/L (ref 38–126)
ALT: 16 U/L — AB (ref 17–63)
AST: 20 U/L (ref 15–41)
Albumin: 3.4 g/dL — ABNORMAL LOW (ref 3.5–5.0)
Anion gap: 6 (ref 5–15)
BUN: 10 mg/dL (ref 6–20)
CALCIUM: 8.5 mg/dL — AB (ref 8.9–10.3)
CHLORIDE: 99 mmol/L — AB (ref 101–111)
CO2: 28 mmol/L (ref 22–32)
Creatinine, Ser: 0.74 mg/dL (ref 0.61–1.24)
GLUCOSE: 96 mg/dL (ref 65–99)
POTASSIUM: 4.2 mmol/L (ref 3.5–5.1)
SODIUM: 133 mmol/L — AB (ref 135–145)
Total Bilirubin: 0.6 mg/dL (ref 0.3–1.2)
Total Protein: 6.6 g/dL (ref 6.5–8.1)

## 2014-10-12 LAB — CBC WITH DIFFERENTIAL/PLATELET
BASOS ABS: 0 10*3/uL (ref 0.0–0.1)
Basophils Relative: 0 % (ref 0–1)
EOS ABS: 0.1 10*3/uL (ref 0.0–0.7)
EOS PCT: 2 % (ref 0–5)
HEMATOCRIT: 34.1 % — AB (ref 39.0–52.0)
Hemoglobin: 11.6 g/dL — ABNORMAL LOW (ref 13.0–17.0)
Lymphocytes Relative: 57 % — ABNORMAL HIGH (ref 12–46)
Lymphs Abs: 2.7 10*3/uL (ref 0.7–4.0)
MCH: 33.5 pg (ref 26.0–34.0)
MCHC: 34 g/dL (ref 30.0–36.0)
MCV: 98.6 fL (ref 78.0–100.0)
MONO ABS: 0.6 10*3/uL (ref 0.1–1.0)
Monocytes Relative: 12 % (ref 3–12)
Neutro Abs: 1.4 10*3/uL — ABNORMAL LOW (ref 1.7–7.7)
Neutrophils Relative %: 29 % — ABNORMAL LOW (ref 43–77)
PLATELETS: 148 10*3/uL — AB (ref 150–400)
RBC: 3.46 MIL/uL — AB (ref 4.22–5.81)
RDW: 11.8 % (ref 11.5–15.5)
WBC: 4.8 10*3/uL (ref 4.0–10.5)

## 2014-10-12 MED ORDER — KETOROLAC TROMETHAMINE 30 MG/ML IJ SOLN
30.0000 mg | Freq: Once | INTRAMUSCULAR | Status: AC
Start: 2014-10-12 — End: 2014-10-12
  Administered 2014-10-12: 30 mg via INTRAVENOUS
  Filled 2014-10-12: qty 1

## 2014-10-12 NOTE — Progress Notes (Signed)
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 He had CT brain without contrast which showed "Atrophy and chronic ischemic/traumatic changes as detailed above. No evidence of acute intracranial abnormality. No intracranial mass, hemorrhage, or edema. No fracture", Rib Xray which showed "1. No acute displaced right rib fracture identified. Chronic right fourth and eighth rib fractures. 2. Low lung volumes, otherwise no acute cardiopulmonary abnormality". Labs are significant for normal white count, Tegretol level 20, but UA/urine culture is pending. Apparently, patient has been sick for the last couple of days and has been coughing, there has been taking his medications as usual so it appears that he likely developed an acute infection leading to encephalopathy. He will therefore be admitted to the stepdown unit for further workup including UA/urine culture/ammonia level. Will hold Tegretol, give empiric antibodies, incentive spirometry, oxygen supplementation and nicotine patch. Will continue to optimize they clinical history as patient's sensorium improves. The history was limited as there were no family members to corroborate patient's story- which is unreliable as he remains confused yet makes an effort to answer questions. Subjective/Overnight developments 10/09/2014: Ammonia level 41,  chest x-ray shows "Mild interval decrease in the conspicuity of the pulmonary interstitium may reflect resolving interstitial edema or pneumonia. When the patient can tolerate the procedure, a PA and lateral chest x-ray with deep inspiration would be useful". UA/UC unremarkable. Patient coughing, he has pleuritic chest pain/weak cough. He says that Tegretol is the only medication that really helps seizures and he has used it for the past 6 years. Will continue current antibiotics, incentive spirometry, broncho-dilators, oxygen supplementation as needed, add morphine/Percocet for pain, add lactulose and reassess ammonia level, and consult neurology regarding and that convulsants. 10/10/14: Appreciate Neurology. Patient refused blood work this morning. He is willing to have another sample taken. His breathing is better. Will follow neurology recommendations-resume Tegretol once levels normalize, hopefully today. 10/11/2014: Patient had CT chest abdomen and pelvis in view of persistent abdominal pain. Reviewed results. Has broken ribs with atelectasis on the right side. We'll continue current management. Tegretol level has normalized. 10/12/14: Not feeling well today. His side is hurting him. Will give ketorolac, and continue rest meds. Hopefully d/c in the next 24-48 hours. Plan Encephalopathy, metabolic/Tegretol toxicity/COPD with acute exacerbation/S/P cerebral aneurysm repair/Seizures/Tobacco dependency/History of rib fracture/elevated ammonia level/hyponatremia   Follow Neurology recommendations  Continue Tegretol   Mobilize. Chronic liver disease/Chronic hepatitis C without hepatic coma/mild hepatic encephalopathy  Ammonia level normal  Thiamine/folate Essential hypertension, benign  BP better controlled.   Continue Norvasc DVT/GI Prophylaxis  Lovenox  PPI Family Communication: Discussed plan of care with patient at bedside. No relatives. Code Status   Full Code Likely DC  Home  eventually  Consultants:  Neurology  Procedures:  none  Antibiotics:  Levaquin 10/08/2014>  HPI/Subjective: Feels better. Still has abdominal pain.  Objective: Filed Vitals:   10/12/14 0533  BP: 137/74  Pulse: 64  Temp: 97.5 F (36.4 C)  Resp: 19    Intake/Output Summary (Last 24 hours) at 10/12/14 2017  Last data filed at 10/12/14 1804  Gross per 24 hour  Intake    840 ml  Output   1550 ml  Net   -710 ml   Filed Weights   10/08/14 1645  Weight: 89.7 kg (197 lb 12 oz)    Exam:   General:  Comfortable at rest.  Cardiovascular: S1-S2 normal. No murmurs. Pulse regular.  Respiratory: Good air entry bilaterally. No rhonchi or rales.  Abdomen: Some bruising and tenderness right costophrenic area..  Musculoskeletal: No pedal edema   Neurological: Intact  Data Reviewed: Basic Metabolic Panel:  Recent Labs Lab 10/08/14 1142 10/09/14 0340 10/10/14 0853 10/11/14 0531 10/12/14 0537  NA 131* 129* 136 133* 133*  K 4.1 4.0 4.3 4.2 4.2  CL 99* 97* 101 98* 99*  CO2 GLUCOSE 102* 104* 105* 99 96  BUN 12 10 <5* 7 10  CREATININE 0.75 0.64 0.72 0.71 0.74  CALCIUM 8.2* 8.4* 9.2 8.6* 8.5*  MG  --  1.8  --   --   --   PHOS  --  3.9  --   --   --    Liver Function Tests:  Recent Labs Lab 10/08/14 1142 10/09/14 0340 10/10/14 0853 10/12/14 0537  AST ALT 16*  ALKPHOS 70 70 78 69  BILITOT 0.6 0.7 0.8 0.6  PROT 7.4 6.8 8.1 6.6  ALBUMIN 3.8 3.4* 3.9 3.4*   No results for input(s): LIPASE, AMYLASE in the last 168 hours.  Recent Labs Lab 10/08/14 1508 10/09/14 0340 10/10/14 0853  AMMONIA 26 41* 17   CBC:  Recent Labs Lab 10/08/14 1036 10/10/14 0853 10/11/14 0531 10/12/14 0537  WBC 6.3 5.9 5.3 4.8  NEUTROABS 3.1 2.8  --  1.4*  HGB 13.5 14.4 12.7* 11.6*  HCT 38.2* 40.7 36.5* 34.1*  MCV 98.2 99.3 99.7 98.6  PLT 148* 166 172 148*   Cardiac Enzymes: No results for input(s): CKTOTAL, CKMB, CKMBINDEX,  TROPONINI in the last 168 hours. BNP (last 3 results) No results for input(s): BNP in the last 8760 hours.  ProBNP (last 3 results) No results for input(s): PROBNP in the last 8760 hours.  CBG:  Recent Labs Lab 10/08/14 1059  GLUCAP 108*  108*    Recent Results (from the past 240 hour(s))  MRSA PCR Screening     Status: None   Collection Time: 10/08/14  4:46 PM  Result Value Ref Range Status   MRSA by PCR NEGATIVE NEGATIVE Final    Comment:        The GeneXpert MRSA Assay (FDA approved for NASAL specimens only), is one component of a comprehensive MRSA colonization surveillance program. It is not intended to diagnose MRSA infection nor to guide or monitor treatment for MRSA infections.   Urine culture     Status: None   Collection Time: 10/08/14  6:03 PM  Result Value Ref Range Status   Specimen Description URINE, CLEAN CATCH  Final   Special Requests NONE  Final   Culture   Final    MULTIPLE SPECIES PRESENT, SUGGEST RECOLLECTION Performed at [facility]    Report Status 10/10/2014 FINAL  Final     Studies: Ct Chest W Contrast  10/11/2014   CLINICAL DATA:  54 year old who has had multiple recent falls and presents with back pain and acute onset of chest pain chest pain which began yesterday associated with coughing and generalized weakness. Chronic bilateral lower quadrant  abdominal pain for several weeks. Current history of hepatitis-C, hypertension and COPD.  EXAM: CT CHEST, ABDOMEN AND PELVIS WITHOUT CONTRAST  TECHNIQUE: Multidetector CT imaging of the chest, abdomen and pelvis was performed following the standard protocol without IV contrast.  COMPARISON:  None.  FINDINGS: CT CHEST FINDINGS  Lungs: Calcified granuloma in the peripheral right upper lobe. No noncalcified pulmonary parenchymal nodules or masses. Mild passive atelectasis in the right lower lobe. Linear atelectasis in the superior segment right lower lobe and in the lingula. Lungs otherwise clear  without airspace consolidation or interstitial lung disease.  Trachea/bronchi: Central airways patent without significant bronchial wall thickening.  Pleura:  Small right pleural effusion.  No left pleural effusion.  Mediastinum: No mediastinal masses. Minimal residual thymic tissue in the anterior superior mediastinum.  Heart and Vascular: Heart size upper normal with moderate LAD coronary atherosclerosis and evidence of left ventricular hypertrophy. No pericardial effusion. Mild to moderate atherosclerosis involving the thoracic aorta without aneurysm or dissection. Widely patent proximal great vessels.  Lymphatic: No pathologic mediastinal, hilar or axillary lymphadenopathy.  Other findings: Mild bilateral gynecomastia.  Visualized lower neck: Visualized thyroid gland normal in size and appearance without nodularity. No supraclavicular lymphadenopathy.  Visualized upper abdomen: See report of CT abdomen and pelvis below.  Musculoskeletal: Acute fractures involving the right posterior 10th, 11th and 12th ribs. Old healed fracture involving the right posterior 8th rib. Subacute fractures with early healing involving the anterior left 3rd, 5th, 6th, 7th, and 8th ribs. No fractures involving the thoracic spine.  CT ABDOMEN AND PELVIS FINDINGS  Hepatobiliary: Mild hepatomegaly with relative enlargement of the left lobe and caudate lobe compared to the right lobe. Mildly irregular hepatic contour. No focal hepatic parenchymal abnormality. Normal-appearing gallbladder without calcified gallstones. No biliary ductal dilation.  Spleen:  Normal in size and appearance.  Pancreas:  Normal in appearance.  No pancreatic ductal dilation.  Adrenal glands:  Normal in appearance.  Genitourinary: Both kidneys normal in size and appearance. No evidence of urinary tract calculi. Normal-appearing decompressed urinary bladder.  Upper normal sized prostate gland, particularly the median lobe. Normal seminal vesicles.  Gastrointestinal:  Stomach normal in appearance, filled with food. Wall thickening and luminal narrowing involving a several cm segment of the distal and terminal ileum, without evidence of edema or inflammation in the surrounding fat. Remaining small bowel unremarkable. He elongated sigmoid colon extending well above the umbilicus in the abdominal midline. Moderate stool burden with both liquid and solid stool. No evidence of diverticulosis and no focal abnormality involving the colon. Cecum extends lobe in the right side of the pelvis. Normal-appearing long appendix identified in the right lower pelvis extending upward into the upper pelvis near the midline.  Ascites:  Absent.  Vascular: Severe aortoiliofemoral atherosclerosis without aneurysm. Visceral arteries patent.  Lymphatic:  No pathologic lymphadenopathy in the abdomen or pelvis.  Other findings: Edema or ecchymosis involving the subcutaneous fat of the right flank.  Musculoskeletal: Severe degenerative disc disease and spondylosis at L3-4, L4-5 and L5-S1 with facet degenerative changes at these levels. No fractures involving the lumbosacral spine or the pelvis.  Visualized lower thorax: See above report for the CT chest.  IMPRESSION: 1. Small right pleural effusion and mild passive atelectasis involving the right lower lobe. Linear atelectasis involving the superior segment right lower lobe and lingula. No acute cardiopulmonary disease otherwise. 2. Very small calcified granuloma in the posterior right upper lobe. 3. Hypertrophy and moderate LAD coronary atherosclerosis. 4. Mild bilateral gynecomastia. 5. No acute  abnormalities involving the abdomen or pelvis. 6. Hepatic cirrhosis.  No focal hepatic parenchymal abnormality. 7. Wall thickening involving a several cm segment of the distal and terminal ileum with luminal narrowing but no evidence of bowel obstruction. As there is no associated edema or inflammation in the surrounding fat, this likely represents chronic  inflammation. 8. Multiple bilateral rib fractures of differing stages of healing, including acute fractures involving the posterior right tenth, eleventh and twelfth ribs. Other osseous findings as above. 9. Generalized atherosclerosis as described above, advanced for age.   Electronically Signed   By: Hulan Saas M.D.   On: 10/11/2014 18:30   Ct Abdomen Pelvis W Contrast  10/11/2014   CLINICAL DATA:  54 year old who has had multiple recent falls and presents with back pain and acute onset of chest pain chest pain which began yesterday associated with coughing and generalized weakness. Chronic bilateral lower quadrant abdominal pain for several weeks. Current history of hepatitis-C, hypertension and COPD.  EXAM: CT CHEST, ABDOMEN AND PELVIS WITHOUT CONTRAST  TECHNIQUE: Multidetector CT imaging of the chest, abdomen and pelvis was performed following the standard protocol without IV contrast.  COMPARISON:  None.  FINDINGS: CT CHEST FINDINGS  Lungs: Calcified granuloma in the peripheral right upper lobe. No noncalcified pulmonary parenchymal nodules or masses. Mild passive atelectasis in the right lower lobe. Linear atelectasis in the superior segment right lower lobe and in the lingula. Lungs otherwise clear without airspace consolidation or interstitial lung disease.  Trachea/bronchi: Central airways patent without significant bronchial wall thickening.  Pleura:  Small right pleural effusion.  No left pleural effusion.  Mediastinum: No mediastinal masses. Minimal residual thymic tissue in the anterior superior mediastinum.  Heart and Vascular: Heart size upper normal with moderate LAD coronary atherosclerosis and evidence of left ventricular hypertrophy. No pericardial effusion. Mild to moderate atherosclerosis involving the thoracic aorta without aneurysm or dissection. Widely patent proximal great vessels.  Lymphatic: No pathologic mediastinal, hilar or axillary lymphadenopathy.  Other findings: Mild  bilateral gynecomastia.  Visualized lower neck: Visualized thyroid gland normal in size and appearance without nodularity. No supraclavicular lymphadenopathy.  Visualized upper abdomen: See report of CT abdomen and pelvis below.  Musculoskeletal: Acute fractures involving the right posterior 10th, 11th and 12th ribs. Old healed fracture involving the right posterior 8th rib. Subacute fractures with early healing involving the anterior left 3rd, 5th, 6th, 7th, and 8th ribs. No fractures involving the thoracic spine.  CT ABDOMEN AND PELVIS FINDINGS  Hepatobiliary: Mild hepatomegaly with relative enlargement of the left lobe and caudate lobe compared to the right lobe. Mildly irregular hepatic contour. No focal hepatic parenchymal abnormality. Normal-appearing gallbladder without calcified gallstones. No biliary ductal dilation.  Spleen:  Normal in size and appearance.  Pancreas:  Normal in appearance.  No pancreatic ductal dilation.  Adrenal glands:  Normal in appearance.  Genitourinary: Both kidneys normal in size and appearance. No evidence of urinary tract calculi. Normal-appearing decompressed urinary bladder.  Upper normal sized prostate gland, particularly the median lobe. Normal seminal vesicles.  Gastrointestinal: Stomach normal in appearance, filled with food. Wall thickening and luminal narrowing involving a several cm segment of the distal and terminal ileum, without evidence of edema or inflammation in the surrounding fat. Remaining small bowel unremarkable. He elongated sigmoid colon extending well above the umbilicus in the abdominal midline. Moderate stool burden with both liquid and solid stool. No evidence of diverticulosis and no focal abnormality involving the colon. Cecum extends lobe in the right side of the pelvis.  Normal-appearing long appendix identified in the right lower pelvis extending upward into the upper pelvis near the midline.  Ascites:  Absent.  Vascular: Severe aortoiliofemoral  atherosclerosis without aneurysm. Visceral arteries patent.  Lymphatic:  No pathologic lymphadenopathy in the abdomen or pelvis.  Other findings: Edema or ecchymosis involving the subcutaneous fat of the right flank.  Musculoskeletal: Severe degenerative disc disease and spondylosis at L3-4, L4-5 and L5-S1 with facet degenerative changes at these levels. No fractures involving the lumbosacral spine or the pelvis.  Visualized lower thorax: See above report for the CT chest.  IMPRESSION: 1. Small right pleural effusion and mild passive atelectasis involving the right lower lobe. Linear atelectasis involving the superior segment right lower lobe and lingula. No acute cardiopulmonary disease otherwise. 2. Very small calcified granuloma in the posterior right upper lobe. 3. Hypertrophy and moderate LAD coronary atherosclerosis. 4. Mild bilateral gynecomastia. 5. No acute abnormalities involving the abdomen or pelvis. 6. Hepatic cirrhosis.  No focal hepatic parenchymal abnormality. 7. Wall thickening involving a several cm segment of the distal and terminal ileum with luminal narrowing but no evidence of bowel obstruction. As there is no associated edema or inflammation in the surrounding fat, this likely represents chronic inflammation. 8. Multiple bilateral rib fractures of differing stages of healing, including acute fractures involving the posterior right tenth, eleventh and twelfth ribs. Other osseous findings as above. 9. Generalized atherosclerosis as described above, advanced for age.   Electronically Signed   By: Hulan Saas M.D.   On: 10/11/2014 18:30    Scheduled Meds: . amLODipine  5 mg Oral Daily  . carbamazepine  400 mg Oral TID  . folic acid  1 mg Oral Daily  . levETIRAcetam  1,000 mg Oral BID  . levofloxacin  750 mg Oral q1800  . metoprolol  25 mg Oral BID  . nicotine  21 mg Transdermal Daily  . pantoprazole  40 mg Oral Daily  . pneumococcal 23 valent vaccine  0.5 mL Intramuscular  Tomorrow-1000  . pregabalin  75 mg Oral BID  . thiamine  100 mg Oral Daily   Continuous Infusions:    Time spent: 25 minutes    [name] [name]  [facility] Pager [phone]. If 7PM-7AM, please contact night-coverage at [email or web address], password [facility] 10/12/2014, 8:17 PM  LOS: 4 days

## 2014-10-13 ENCOUNTER — Inpatient Hospital Stay (HOSPITAL_COMMUNITY): Payer: Medicaid Other

## 2014-10-13 DIAGNOSIS — S52209A Unspecified fracture of shaft of unspecified ulna, initial encounter for closed fracture: Secondary | ICD-10-CM | POA: Diagnosis present

## 2014-10-13 DIAGNOSIS — M25522 Pain in left elbow: Secondary | ICD-10-CM | POA: Diagnosis present

## 2014-10-13 LAB — COMPREHENSIVE METABOLIC PANEL
ALBUMIN: 3.3 g/dL — AB (ref 3.5–5.0)
ALK PHOS: 65 U/L (ref 38–126)
ALT: 14 U/L — AB (ref 17–63)
ANION GAP: 5 (ref 5–15)
AST: 19 U/L (ref 15–41)
BUN: 11 mg/dL (ref 6–20)
CHLORIDE: 96 mmol/L — AB (ref 101–111)
CO2: 26 mmol/L (ref 22–32)
Calcium: 7.9 mg/dL — ABNORMAL LOW (ref 8.9–10.3)
Creatinine, Ser: 0.72 mg/dL (ref 0.61–1.24)
GFR calc Af Amer: >60 mL/min (ref >60)
GFR calc non Af Amer: >60 mL/min (ref >60)
Glucose, Bld: 89 mg/dL (ref 65–99)
POTASSIUM: 4.1 mmol/L (ref 3.5–5.1)
SODIUM: 127 mmol/L — AB (ref 135–145)
Total Bilirubin: 0.6 mg/dL (ref 0.3–1.2)
Total Protein: 6.6 g/dL (ref 6.5–8.1)

## 2014-10-13 LAB — CBC
HCT: 34 % — ABNORMAL LOW (ref 39.0–52.0)
Hemoglobin: 11.8 g/dL — ABNORMAL LOW (ref 13.0–17.0)
MCH: 33.9 pg (ref 26.0–34.0)
MCHC: 34.7 g/dL (ref 30.0–36.0)
MCV: 97.7 fL (ref 78.0–100.0)
Platelets: 155 10*3/uL (ref 150–400)
RBC: 3.48 MIL/uL — ABNORMAL LOW (ref 4.22–5.81)
RDW: 11.7 % (ref 11.5–15.5)
WBC: 5.2 10*3/uL (ref 4.0–10.5)

## 2014-10-13 LAB — CARBAMAZEPINE, FREE AND TOTAL
CARBAMAZEPINE, TOTAL: 4.1 ug/mL (ref 4.0–12.0)
Carbamazepine, Free: 1.1 ug/mL (ref 0.6–4.2)

## 2014-10-13 MED ORDER — KETOROLAC TROMETHAMINE 30 MG/ML IJ SOLN
30.0000 mg | Freq: Once | INTRAMUSCULAR | Status: AC
  Administered 2014-10-13: 30 mg via INTRAVENOUS
  Filled 2014-10-13: qty 1

## 2014-10-13 MED ORDER — TRAMADOL HCL 50 MG PO TABS
100.0000 mg | ORAL_TABLET | Freq: Two times a day (BID) | ORAL | Status: DC
  Administered 2014-10-13 – 2014-10-14 (×3): 100 mg via ORAL
  Filled 2014-10-13 (×3): qty 2

## 2014-10-13 NOTE — Progress Notes (Signed)
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 He had CT brain without contrast which showed "Atrophy and chronic ischemic/traumatic changes as detailed above. No evidence of acute intracranial abnormality. No intracranial mass, hemorrhage, or edema. No fracture", Rib Xray which showed "1. No acute displaced right rib fracture identified. Chronic right fourth and eighth rib fractures. 2. Low lung volumes, otherwise no acute cardiopulmonary abnormality". Labs are significant for normal white count, Tegretol level 20, but UA/urine culture is pending. Apparently, patient has been sick for the last couple of days and has been coughing, there has been taking his medications as usual so it appears that he likely developed an acute infection leading to encephalopathy. He will therefore be admitted to the stepdown unit for further workup including UA/urine culture/ammonia level. Will hold Tegretol, give empiric antibodies, incentive spirometry, oxygen supplementation and nicotine patch. Will continue to optimize they clinical history as patient's sensorium improves. The history was limited as there were no family members to corroborate patient's story- which is unreliable as he remains confused yet makes an effort to answer questions. Subjective/Overnight developments 10/09/2014: Ammonia level 41,  chest x-ray shows "Mild interval decrease in the conspicuity of the pulmonary interstitium may reflect resolving interstitial edema or pneumonia. When the patient can tolerate the procedure, a PA and lateral chest x-ray with deep inspiration would be useful". UA/UC unremarkable. Patient coughing, he has pleuritic chest pain/weak cough. He says that Tegretol is the only medication that really helps seizures and he has used it for the past 6 years. Will continue current antibiotics, incentive spirometry, broncho-dilators, oxygen supplementation as needed, add morphine/Percocet for pain, add lactulose and reassess ammonia level, and consult neurology regarding and that convulsants. 10/10/14: Appreciate Neurology. Patient refused blood work this morning. He is willing to have another sample taken. His breathing is better. Will follow neurology recommendations-resume Tegretol once levels normalize, hopefully today. 10/11/2014: Patient had CT chest abdomen and pelvis in view of persistent abdominal pain. Reviewed results. Has broken ribs with atelectasis on the right side. We'll continue current management. Tegretol level has normalized. 10/12/14: Not feeling well today. His side is hurting him. Will give ketorolac, and continue rest meds. Hopefully d/c in the next 24-48 hours. 10/13/14: Complaints of left elbow pain. He winces in pain on movement of the elbow- he hasn't complained about it since admission. Has side pains. Insists he would rather go home but he is in too much pain today. Patient mostly somnolent. I am concerned that somnloence will increase fall risk, especially if patient goes home by himself. Will d/c Ativan, add ultram, give dose of ketorolac, obtain left elbow Xrayand follow with CSW regarding d/c plans- likely tomorrow. Plan Encephalopathy, metabolic/Tegretol toxicity/COPD with acute exacerbation/S/P cerebral aneurysm repair/Seizures/Tobacco dependency/History of rib fracture/elevated ammonia  level/hyponatremia   D/c Ativan  Mobilize.  Add ultram, ketorolac. Chronic liver disease/Chronic hepatitis C without hepatic coma/mild hepatic encephalopathy  Ammonia level normal  Thiamine/folate Essential hypertension, benign  BP better controlled.   Continue Norvasc Left Elbow pain  Xray of  elbow. DVT/GI Prophylaxis  Lovenox  PPI Family Communication: Discussed plan of care with patient at bedside. No relatives. Code Status   Full Code Likely DC  Home eventually  Consultants:  Neurology  Procedures:  none  Antibiotics:  Levaquin 10/08/2014>  HPI/Subjective: Feels better. Still has abdominal pain  Objective: Filed Vitals:   10/13/14 1400  BP: 120/80  Pulse: 66  Temp: 97.9 F (36.6 C)  Resp: 18    Intake/Output Summary (Last 24 hours) at 10/13/14 1412 Last data filed at 10/13/14 1200  Gross per 24 hour  Intake    960 ml  Output   1500 ml  Net   -540 ml   Filed Weights   10/08/14 1645  Weight: 89.7 kg (197 lb 12 oz)    Exam:   General:  Comfortable at rest.  Cardiovascular: S1-S2 normal. No murmurs. Pulse regular.  Respiratory: Good air entry bilaterally. No rhonchi or rales.  Abdomen: Soft and nontender. Normal bowel sounds. No organomegaly.  Musculoskeletal: No pedal edema.  Tenderness with passive movement of left elbow.  Neurological: Intact  Data Reviewed: Basic Metabolic Panel:  Recent Labs Lab 10/09/14 0340 10/10/14 0853 10/11/14 0531 10/12/14 0537 10/13/14 0545  NA 129* 136 133* 133* 127*  K 4.0 4.3 4.2 4.2 4.1  CL 97* 101 98* 99* 96*  CO2 25 28 28 28 26   GLUCOSE 104* 105* 99 96 89  BUN 10 <5* 7 10 11   CREATININE 0.64 0.72 0.71 0.74 0.72  CALCIUM 8.4* 9.2 8.6* 8.5* 7.9*  MG 1.8  --   --   --   --   PHOS 3.9  --   --   --   --    Liver Function Tests:  Recent Labs Lab 10/08/14 1142 10/09/14 0340 10/10/14 0853 10/12/14 0537 10/13/14 0545  AST 29 24 25 20 19   ALT 21 18 18  16* 14*  ALKPHOS  70 70 78 69 65  BILITOT 0.6 0.7 0.8 0.6 0.6  PROT 7.4 6.8 8.1 6.6 6.6  ALBUMIN 3.8 3.4* 3.9 3.4* 3.3*   No results for input(s): LIPASE, AMYLASE in the last 168 hours.  Recent Labs Lab 10/08/14 1508 10/09/14 0340 10/10/14 0853  AMMONIA 26 41* 17   CBC:  Recent Labs Lab 10/08/14 1036 10/10/14 0853 10/11/14 0531 10/12/14 0537 10/13/14 0545  WBC 6.3 5.9 5.3 4.8 5.2  NEUTROABS 3.1 2.8  --  1.4*  --   HGB 13.5 14.4 12.7* 11.6* 11.8*  HCT 38.2* 40.7 36.5* 34.1* 34.0*  MCV 98.2 99.3 99.7 98.6 97.7  PLT 148* 166 172 148* 155   Cardiac Enzymes: No results for input(s): CKTOTAL, CKMB, CKMBINDEX, TROPONINI in the last 168 hours. BNP (last 3 results) No results for input(s): BNP in the last 8760 hours.  ProBNP (last 3 results) No results for input(s): PROBNP in the last 8760 hours.  CBG:  Recent Labs Lab 10/08/14 1059  GLUCAP 108*  108*    Recent Results (from the past 240 hour(s))  MRSA PCR Screening     Status: None   Collection Time: 10/08/14  4:46 PM  Result Value Ref Range Status   MRSA by PCR NEGATIVE NEGATIVE Final    Comment:        The GeneXpert MRSA Assay (FDA approved for NASAL specimens only), is one component of a comprehensive MRSA colonization surveillance program. It is not intended to diagnose MRSA infection nor to guide or monitor treatment for MRSA infections.   Urine culture  Status: None   Collection Time: 10/08/14  6:03 PM  Result Value Ref Range Status   Specimen Description URINE, CLEAN CATCH  Final   Special Requests NONE  Final   Culture   Final    MULTIPLE SPECIES PRESENT, SUGGEST RECOLLECTION Performed at [facility]    Report Status 10/10/2014 FINAL  Final     Studies: Ct Chest W Contrast  10/11/2014   CLINICAL DATA:  54 year old who has had multiple recent falls and presents with back pain and acute onset of chest pain chest pain which began yesterday associated with coughing and generalized weakness. Chronic  bilateral lower quadrant abdominal pain for several weeks. Current history of hepatitis-C, hypertension and COPD.  EXAM: CT CHEST, ABDOMEN AND PELVIS WITHOUT CONTRAST  TECHNIQUE: Multidetector CT imaging of the chest, abdomen and pelvis was performed following the standard protocol without IV contrast.  COMPARISON:  None.  FINDINGS: CT CHEST FINDINGS  Lungs: Calcified granuloma in the peripheral right upper lobe. No noncalcified pulmonary parenchymal nodules or masses. Mild passive atelectasis in the right lower lobe. Linear atelectasis in the superior segment right lower lobe and in the lingula. Lungs otherwise clear without airspace consolidation or interstitial lung disease.  Trachea/bronchi: Central airways patent without significant bronchial wall thickening.  Pleura:  Small right pleural effusion.  No left pleural effusion.  Mediastinum: No mediastinal masses. Minimal residual thymic tissue in the anterior superior mediastinum.  Heart and Vascular: Heart size upper normal with moderate LAD coronary atherosclerosis and evidence of left ventricular hypertrophy. No pericardial effusion. Mild to moderate atherosclerosis involving the thoracic aorta without aneurysm or dissection. Widely patent proximal great vessels.  Lymphatic: No pathologic mediastinal, hilar or axillary lymphadenopathy.  Other findings: Mild bilateral gynecomastia.  Visualized lower neck: Visualized thyroid gland normal in size and appearance without nodularity. No supraclavicular lymphadenopathy.  Visualized upper abdomen: See report of CT abdomen and pelvis below.  Musculoskeletal: Acute fractures involving the right posterior 10th, 11th and 12th ribs. Old healed fracture involving the right posterior 8th rib. Subacute fractures with early healing involving the anterior left 3rd, 5th, 6th, 7th, and 8th ribs. No fractures involving the thoracic spine.  CT ABDOMEN AND PELVIS FINDINGS  Hepatobiliary: Mild hepatomegaly with relative enlargement  of the left lobe and caudate lobe compared to the right lobe. Mildly irregular hepatic contour. No focal hepatic parenchymal abnormality. Normal-appearing gallbladder without calcified gallstones. No biliary ductal dilation.  Spleen:  Normal in size and appearance.  Pancreas:  Normal in appearance.  No pancreatic ductal dilation.  Adrenal glands:  Normal in appearance.  Genitourinary: Both kidneys normal in size and appearance. No evidence of urinary tract calculi. Normal-appearing decompressed urinary bladder.  Upper normal sized prostate gland, particularly the median lobe. Normal seminal vesicles.  Gastrointestinal: Stomach normal in appearance, filled with food. Wall thickening and luminal narrowing involving a several cm segment of the distal and terminal ileum, without evidence of edema or inflammation in the surrounding fat. Remaining small bowel unremarkable. He elongated sigmoid colon extending well above the umbilicus in the abdominal midline. Moderate stool burden with both liquid and solid stool. No evidence of diverticulosis and no focal abnormality involving the colon. Cecum extends lobe in the right side of the pelvis. Normal-appearing long appendix identified in the right lower pelvis extending upward into the upper pelvis near the midline.  Ascites:  Absent.  Vascular: Severe aortoiliofemoral atherosclerosis without aneurysm. Visceral arteries patent.  Lymphatic:  No pathologic lymphadenopathy in the abdomen or pelvis.  Other  findings: Edema or ecchymosis involving the subcutaneous fat of the right flank.  Musculoskeletal: Severe degenerative disc disease and spondylosis at L3-4, L4-5 and L5-S1 with facet degenerative changes at these levels. No fractures involving the lumbosacral spine or the pelvis.  Visualized lower thorax: See above report for the CT chest.  IMPRESSION: 1. Small right pleural effusion and mild passive atelectasis involving the right lower lobe. Linear atelectasis involving the  superior segment right lower lobe and lingula. No acute cardiopulmonary disease otherwise. 2. Very small calcified granuloma in the posterior right upper lobe. 3. Hypertrophy and moderate LAD coronary atherosclerosis. 4. Mild bilateral gynecomastia. 5. No acute abnormalities involving the abdomen or pelvis. 6. Hepatic cirrhosis.  No focal hepatic parenchymal abnormality. 7. Wall thickening involving a several cm segment of the distal and terminal ileum with luminal narrowing but no evidence of bowel obstruction. As there is no associated edema or inflammation in the surrounding fat, this likely represents chronic inflammation. 8. Multiple bilateral rib fractures of differing stages of healing, including acute fractures involving the posterior right tenth, eleventh and twelfth ribs. Other osseous findings as above. 9. Generalized atherosclerosis as described above, advanced for age.   Electronically Signed   By: Hulan Saas M.D.   On: 10/11/2014 18:30   Ct Abdomen Pelvis W Contrast  10/11/2014   CLINICAL DATA:  54 year old who has had multiple recent falls and presents with back pain and acute onset of chest pain chest pain which began yesterday associated with coughing and generalized weakness. Chronic bilateral lower quadrant abdominal pain for several weeks. Current history of hepatitis-C, hypertension and COPD.  EXAM: CT CHEST, ABDOMEN AND PELVIS WITHOUT CONTRAST  TECHNIQUE: Multidetector CT imaging of the chest, abdomen and pelvis was performed following the standard protocol without IV contrast.  COMPARISON:  None.  FINDINGS: CT CHEST FINDINGS  Lungs: Calcified granuloma in the peripheral right upper lobe. No noncalcified pulmonary parenchymal nodules or masses. Mild passive atelectasis in the right lower lobe. Linear atelectasis in the superior segment right lower lobe and in the lingula. Lungs otherwise clear without airspace consolidation or interstitial lung disease.  Trachea/bronchi: Central  airways patent without significant bronchial wall thickening.  Pleura:  Small right pleural effusion.  No left pleural effusion.  Mediastinum: No mediastinal masses. Minimal residual thymic tissue in the anterior superior mediastinum.  Heart and Vascular: Heart size upper normal with moderate LAD coronary atherosclerosis and evidence of left ventricular hypertrophy. No pericardial effusion. Mild to moderate atherosclerosis involving the thoracic aorta without aneurysm or dissection. Widely patent proximal great vessels.  Lymphatic: No pathologic mediastinal, hilar or axillary lymphadenopathy.  Other findings: Mild bilateral gynecomastia.  Visualized lower neck: Visualized thyroid gland normal in size and appearance without nodularity. No supraclavicular lymphadenopathy.  Visualized upper abdomen: See report of CT abdomen and pelvis below.  Musculoskeletal: Acute fractures involving the right posterior 10th, 11th and 12th ribs. Old healed fracture involving the right posterior 8th rib. Subacute fractures with early healing involving the anterior left 3rd, 5th, 6th, 7th, and 8th ribs. No fractures involving the thoracic spine.  CT ABDOMEN AND PELVIS FINDINGS  Hepatobiliary: Mild hepatomegaly with relative enlargement of the left lobe and caudate lobe compared to the right lobe. Mildly irregular hepatic contour. No focal hepatic parenchymal abnormality. Normal-appearing gallbladder without calcified gallstones. No biliary ductal dilation.  Spleen:  Normal in size and appearance.  Pancreas:  Normal in appearance.  No pancreatic ductal dilation.  Adrenal glands:  Normal in appearance.  Genitourinary: Both kidneys normal  in size and appearance. No evidence of urinary tract calculi. Normal-appearing decompressed urinary bladder.  Upper normal sized prostate gland, particularly the median lobe. Normal seminal vesicles.  Gastrointestinal: Stomach normal in appearance, filled with food. Wall thickening and luminal narrowing  involving a several cm segment of the distal and terminal ileum, without evidence of edema or inflammation in the surrounding fat. Remaining small bowel unremarkable. He elongated sigmoid colon extending well above the umbilicus in the abdominal midline. Moderate stool burden with both liquid and solid stool. No evidence of diverticulosis and no focal abnormality involving the colon. Cecum extends lobe in the right side of the pelvis. Normal-appearing long appendix identified in the right lower pelvis extending upward into the upper pelvis near the midline.  Ascites:  Absent.  Vascular: Severe aortoiliofemoral atherosclerosis without aneurysm. Visceral arteries patent.  Lymphatic:  No pathologic lymphadenopathy in the abdomen or pelvis.  Other findings: Edema or ecchymosis involving the subcutaneous fat of the right flank.  Musculoskeletal: Severe degenerative disc disease and spondylosis at L3-4, L4-5 and L5-S1 with facet degenerative changes at these levels. No fractures involving the lumbosacral spine or the pelvis.  Visualized lower thorax: See above report for the CT chest.  IMPRESSION: 1. Small right pleural effusion and mild passive atelectasis involving the right lower lobe. Linear atelectasis involving the superior segment right lower lobe and lingula. No acute cardiopulmonary disease otherwise. 2. Very small calcified granuloma in the posterior right upper lobe. 3. Hypertrophy and moderate LAD coronary atherosclerosis. 4. Mild bilateral gynecomastia. 5. No acute abnormalities involving the abdomen or pelvis. 6. Hepatic cirrhosis.  No focal hepatic parenchymal abnormality. 7. Wall thickening involving a several cm segment of the distal and terminal ileum with luminal narrowing but no evidence of bowel obstruction. As there is no associated edema or inflammation in the surrounding fat, this likely represents chronic inflammation. 8. Multiple bilateral rib fractures of differing stages of healing, including  acute fractures involving the posterior right tenth, eleventh and twelfth ribs. Other osseous findings as above. 9. Generalized atherosclerosis as described above, advanced for age.   Electronically Signed   By: Hulan Saas M.D.   On: 10/11/2014 18:30    Scheduled Meds: . amLODipine  5 mg Oral Daily  . carbamazepine  400 mg Oral TID  . folic acid  1 mg Oral Daily  . ketorolac  30 mg Intravenous Once  . levETIRAcetam  1,000 mg Oral BID  . levofloxacin  750 mg Oral q1800  . metoprolol  25 mg Oral BID  . nicotine  21 mg Transdermal Daily  . pantoprazole  40 mg Oral Daily  . pneumococcal 23 valent vaccine  0.5 mL Intramuscular Tomorrow-1000  . pregabalin  75 mg Oral BID  . thiamine  100 mg Oral Daily  . traMADol  100 mg Oral Q12H   Continuous Infusions:    Time spent: 25 minutes    [name] [name]  [facility] Pager [phone]. If 7PM-7AM, please contact night-coverage at [email or web address], password [facility] 10/13/2014, 2:12 PM  LOS: 5 days

## 2014-10-13 NOTE — Significant Event (Addendum)
X-ray of the left elbow shows "Nondisplaced proximal ulnar fracture with intra-articular extension", will consult orthopedics for further management. This raises concern for patient's ability to manage by himself at home. I have had long discussion with him regarding these concerns and patient will reconsider.

## 2014-10-13 NOTE — Clinical Social Work Note (Signed)
Clinical Social Work Assessment  Patient Details  Name: [name] [name] MRN: [id number] Date of Birth: Nov 27, 1960  Date of referral:  10/13/14               Reason for consult:  Discharge Planning, Substance Use/ETOH Abuse                Permission sought to share information with:    Permission granted to share information::  No  Name::        Agency::     Relationship::     Contact Information:     Housing/Transportation Living arrangements for the past 2 months:  Apartment Source of Information:  Patient Patient Interpreter Needed:  None Criminal Activity/Legal Involvement Pertinent to Current Situation/Hospitalization:  No - Comment as needed Significant Relationships:  None Lives with:  Roommate Do you feel safe going back to the place where you live?  Yes Need for family participation in patient care:  No (Coment)  Care giving concerns:  Patient lives with roommate but reports no safety concerns with returning home.   Social Worker assessment / plan:  CSW received referral due to assess for substance use and to assist with DC planning. CSW reviewed chart which stated that PT recommended SNF at DC.  Patient reports he has limited family support and lives with a roommate. Patient reports that roommate is not a friend and they do not assist each other. CSW inquired about patient's session with PT and their recommendation for SNF placement. Patient reports he does not want to go to SNF and does not feel it is needed. Patient reports he is independent and is able to manage himself. Patient reports he was considering moving into [name] and asked for information for [name]. CSW provided list and explained that [name] is private pay unless facility accepts Medicaid but that patient would have to supplement payment with his disability check. Patient currently receives about $500 a month and reports he would not want to use his disability check for placement. Patient reports he will return home  and continue living with roommate at DC.  CSW attempted to speak with patient re: substance use and treatment at DC. Patient politely declined to complete SBIRT and denies any substance use at home. Patient reports he does not need any treatment because he is not currently using any alcohol. Patient reports a long history of seizures but does not believe it was related to alcohol use.  Since patient declines SA treatment and placement, CSW is signing off but available if further needs arise.  Employment status:  Disabled (Comment on whether or not currently receiving Disability) Insurance information:  Medicaid [place] PT Recommendations:  Skilled Nursing Facility Information / Referral to community resources:  Skilled Nursing Facility, Outpatient Substance Abuse Treatment Options  Patient/Family's Response to care:  Patient engaged during assessment but not open to discussing substance use.  Patient/Family's Understanding of and Emotional Response to Diagnosis, Current Treatment, and Prognosis:  Patient reports he is hospitalized because of substance use. Patient acknowledges that living in [name] would be beneficial since he has limited support but does not want to use disability check for placement. Patient believes he will be safe returning home and reports no current CSW needs.  Emotional Assessment Appearance:  Appears stated age, Disheveled Attitude/Demeanor/Rapport:  Other (Cooperative) Affect (typically observed):  Flat Orientation:  Oriented to Self, Oriented to Place, Oriented to  Time, Oriented to Situation Alcohol / Substance use:  Alcohol Use Psych involvement (Current  and /or in the community):  No (Comment)  Discharge Needs  Concerns to be addressed:  No discharge needs identified Readmission within the last 30 days:  No Current discharge risk:  Substance Abuse, Lack of support system Barriers to Discharge:  No Barriers Identified   Marnee Spring, LCSW 10/13/2014,  12:25 PM [phone]

## 2014-10-13 NOTE — Progress Notes (Signed)
PT Cancellation Note  Patient Details Name: [name] [name] MRN: [id number] DOB: September 22, 1960   Cancelled Treatment:     Pt declined all efforts.   Armando Reichert 10/13/2014, 3:30 PM

## 2014-10-13 NOTE — Progress Notes (Signed)
Date:  October 13, 2014 U.R. performed for needs and level of care. Will continue to follow for Case Management needs.  Rhonda Davis, RN, BSN, CCM   [phone] 

## 2014-10-14 LAB — BASIC METABOLIC PANEL
ANION GAP: 7 (ref 5–15)
BUN: 10 mg/dL (ref 6–20)
CALCIUM: 8.5 mg/dL — AB (ref 8.9–10.3)
CO2: 26 mmol/L (ref 22–32)
CREATININE: 0.7 mg/dL (ref 0.61–1.24)
Chloride: 93 mmol/L — ABNORMAL LOW (ref 101–111)
GFR calc non Af Amer: >60 mL/min (ref >60)
Glucose, Bld: 112 mg/dL — ABNORMAL HIGH (ref 65–99)
Potassium: 4 mmol/L (ref 3.5–5.1)
SODIUM: 126 mmol/L — AB (ref 135–145)

## 2014-10-14 LAB — URINALYSIS, ROUTINE W REFLEX MICROSCOPIC
Bilirubin Urine: NEGATIVE
GLUCOSE, UA: NEGATIVE mg/dL
HGB URINE DIPSTICK: NEGATIVE
Ketones, ur: NEGATIVE mg/dL
Leukocytes, UA: NEGATIVE
NITRITE: NEGATIVE
PROTEIN: NEGATIVE mg/dL
SPECIFIC GRAVITY, URINE: 1.016 (ref 1.005–1.030)
Urobilinogen, UA: 1 mg/dL (ref 0.0–1.0)
pH: 6.5 (ref 5.0–8.0)

## 2014-10-14 LAB — CBC
HEMATOCRIT: 36.3 % — AB (ref 39.0–52.0)
Hemoglobin: 12.8 g/dL — ABNORMAL LOW (ref 13.0–17.0)
MCH: 34.4 pg — ABNORMAL HIGH (ref 26.0–34.0)
MCHC: 35.3 g/dL (ref 30.0–36.0)
MCV: 97.6 fL (ref 78.0–100.0)
Platelets: 170 10*3/uL (ref 150–400)
RBC: 3.72 MIL/uL — ABNORMAL LOW (ref 4.22–5.81)
RDW: 11.6 % (ref 11.5–15.5)
WBC: 6.1 10*3/uL (ref 4.0–10.5)

## 2014-10-14 LAB — BRAIN NATRIURETIC PEPTIDE: B Natriuretic Peptide: 64.4 pg/mL (ref 0.0–100.0)

## 2014-10-14 LAB — SODIUM, URINE, RANDOM: Sodium, Ur: 85 mmol/L

## 2014-10-14 MED ORDER — TRAMADOL HCL 50 MG PO TABS
100.0000 mg | ORAL_TABLET | Freq: Three times a day (TID) | ORAL | Status: DC
  Administered 2014-10-14 – 2014-10-15 (×3): 100 mg via ORAL
  Filled 2014-10-14 (×3): qty 2

## 2014-10-14 MED ORDER — IBUPROFEN 400 MG PO TABS
400.0000 mg | ORAL_TABLET | Freq: Three times a day (TID) | ORAL | Status: DC
  Administered 2014-10-14 – 2014-10-15 (×4): 400 mg via ORAL
  Filled 2014-10-14 (×5): qty 1

## 2014-10-14 NOTE — Progress Notes (Signed)
Clinical Social Work  CSW spoke with MD who reports patient's elbow is fractured and he had to have splint placed. MD reports patient might be agreeable to placement now due to safety concerns. CSW met with patient and son at bedside to discuss DC plans. Patient reports he is not ready to DC today but will be ready to go home tomorrow. CSW spoke about SNF and [name] placement again with patient and son. Patient adamant that he will not be placed at any facility and will only DC home. When CSW was answering son's questions about placement, patient yelled at [place] and reported that CSW did not need to answer questions because he was going home. CSW explained that it was patient's right to choose where he wanted to live.  CSW spoke with son in hallway who reports patient is stubborn and will not agree for help. Son reports patient has limited support and he is unable to help because he works. Son reports he is frustrated with patient but he is an adult and can make his own decisions.  CSW is signing off but available if further needs arise.  [name] [name], [name] [name] [phone]

## 2014-10-14 NOTE — Progress Notes (Signed)
PT Cancellation Note  Patient Details Name: [name] [name] MRN: [id number] DOB: Jan 08, 1961   Cancelled Treatment:    Reason Eval/Treat Not Completed: Pain limiting ability to participate Pt sleeping upon entering room.  Declined PT stating RN was supposed to bring his pain meds.  RN aware and states pain meds not yet due.   Bevan Disney,[name] 10/14/2014, 2:50 PM Zenovia Jarred, PT, DPT 10/14/2014 Pager: [phone]

## 2014-10-14 NOTE — Progress Notes (Signed)
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 He had CT brain without contrast which showed "Atrophy and chronic ischemic/traumatic changes as detailed above. No evidence of acute intracranial abnormality. No intracranial mass, hemorrhage, or edema. No fracture", Rib Xray which showed "1. No acute displaced right rib fracture identified. Chronic right fourth and eighth rib fractures. 2. Low lung volumes, otherwise no acute cardiopulmonary abnormality". Labs are significant for normal white count, Tegretol level 20, but UA/urine culture is pending. Apparently, patient has been sick for the last couple of days and has been coughing, there has been taking his medications as usual so it appears that he likely developed an acute infection leading to encephalopathy. He will therefore be admitted to the stepdown unit for further workup including UA/urine culture/ammonia level. Will hold Tegretol, give empiric antibodies, incentive spirometry, oxygen supplementation and nicotine patch. Will continue to optimize they clinical history as patient's sensorium improves. The history was limited as there were no family members to corroborate patient's story- which is unreliable as he remains confused yet makes an effort to answer questions. Subjective/Overnight developments 10/09/2014: Ammonia level 41,  chest x-ray shows "Mild interval decrease in the conspicuity of the pulmonary interstitium may reflect resolving interstitial edema or pneumonia. When the patient can tolerate the procedure, a PA and lateral chest x-ray with deep inspiration would be useful". UA/UC unremarkable. Patient coughing, he has pleuritic chest pain/weak cough. He says that Tegretol is the only medication that really helps seizures and he has used it for the past 6 years. Will continue current antibiotics, incentive spirometry, broncho-dilators, oxygen supplementation as needed, add morphine/Percocet for pain, add lactulose and reassess ammonia level, and consult neurology regarding and that convulsants. 10/10/14: Appreciate Neurology. Patient refused blood work this morning. He is willing to have another sample taken. His breathing is better. Will follow neurology recommendations-resume Tegretol once levels normalize, hopefully today. 10/11/2014: Patient had CT chest abdomen and pelvis in view of persistent abdominal pain. Reviewed results. Has broken ribs with atelectasis on the right side. We'll continue current management. Tegretol level has normalized. 10/12/14: Not feeling well today. His side is hurting him. Will give ketorolac, and continue rest meds. Hopefully d/c in the next 24-48 hours. 10/13/14: Complaints of left elbow pain. He winces in pain on movement of the elbow- he hasn't complained about it since admission. Has side pains. Insists he would rather go home but he is in too much pain today. Patient mostly somnolent. I am concerned that somnloence will increase fall risk, especially if patient goes home by himself. Will d/c Ativan, add ultram, give dose of ketorolac, obtain left elbow Xray and follow with CSW regarding d/c plans- likely tomorrow. 403pm-X-ray of the left elbow shows "Nondisplaced proximal ulnar fracture with intra-articular extension", will consult orthopedics for further management. This raises concern for  patient's ability to manage by himself at home. I have had long discussion with him regarding these concerns and patient will reconsider. Spoke with Dr Madelon Lips, with [name] where patient is followed  and he recommended a long arm splint and follow-up with the practice after patient is discharged. He said there was no role for surgical intervention at present as the fracture is nondisplaced. 10/14/2014: Patient has worsening hyponatremia-sodium 126 today, etiology is not apparent at present, could be medications related, SIADH related to the acute lung injury, or other causes. Patient still adamant about going home. I am concerned that he overerestimates his ability to function with one arm in a sling, and no one to assist him at home. He says he will lose his apartment if he has to go to a skilled nursing facility. He hasn't worked with physical therapy today and that is also concerning. We'll try to optimize pain control careful to avoid oversedation which will increase risk for more falls. Hopefully, patient will change his mind and consider skilled nursing facility, if not would need to ensure he is more functional. Will increase frequency of Ultram and add Motrin. Will also check urine lites/serum osmolality/uric acid level/BNP to evaluate cause of hyponatremia. Plan Encephalopathy, metabolic/Tegretol toxicity/S/P cerebral aneurysm repair/Seizures/Tobacco dependency/History of rib fracture/elevated ammonia level/hyponatremia   Change Ultram to 100 mg 3 times a day scheduled, add Motrin  Urine lites/serum osmolality/UA/uric acid level/BNP  Mobilize. Chronic liver disease/Chronic hepatitis C without hepatic coma/mild hepatic encephalopathy  Ammonia level normal  Thiamine/folate Essential hypertension, benign  BP better controlled.   Continue Norvasc Left ulnar fracture and displaced  Long-arm splint  Follow orthopedics at discharge-[facility] DVT/GI  Prophylaxis  Lovenox  PPI Family Communication: Discussed plan of care with patient at bedside.  Code Status   Full Code Likely DC  Home eventually vs SNF  Consultants:  Neurology  Procedures:  Left long-arm splint  Antibiotics:  Levaquin 10/08/2014>  HPI/Subjective: Feels better. Says pain is better.  Objective: Filed Vitals:   10/14/14 1417  BP: 131/70  Pulse: 68  Temp: 98.6 F (37 C)  Resp: 18    Intake/Output Summary (Last 24 hours) at 10/14/14 1803 Last data filed at 10/14/14 1024  Gross per 24 hour  Intake    240 ml  Output    875 ml  Net   -635 ml   Filed Weights   10/08/14 1645  Weight: 89.7 kg (197 lb 12 oz)    Exam:   General:  Comfortable at rest.  Cardiovascular: S1-S2 normal. No murmurs. Pulse regular.  Respiratory: Good air entry bilaterally. No rhonchi or rales.  Abdomen: Soft and nontender. Normal bowel sounds. No organomegaly.  Musculoskeletal: No pedal edema. Left arm splint.   Neurological: Intact  Data Reviewed: Basic Metabolic Panel:  Recent Labs Lab 10/09/14 0340 10/10/14 0853 10/11/14 0531 10/12/14 0537 10/13/14 0545 10/14/14 0541  NA 129* 136 133* 133* 127* 126*  K 4.0 4.3 4.2 4.2 4.1 4.0  CL 97* 101 98* 99* 96* 93*  CO2 GLUCOSE 104* 105* 99 96 89 112*  BUN 10 <5* CREATININE 0.64 0.72 0.71 0.74 0.72 0.70  CALCIUM 8.4* 9.2 8.6* 8.5* 7.9* 8.5*  MG 1.8  --   --   --   --   --   PHOS 3.9  --   --   --   --   --    Liver Function Tests:  Recent Labs Lab 10/08/14 1142 10/09/14 0340 10/10/14 0853 10/12/14 0537 10/13/14 0545  AST ALT 16* 14*  ALKPHOS 70 70  78 69 65  BILITOT 0.6 0.7 0.8 0.6 0.6  PROT 7.4 6.8 8.1 6.6 6.6  ALBUMIN 3.8 3.4* 3.9 3.4* 3.3*   No results for input(s): LIPASE, AMYLASE in the last 168 hours.  Recent Labs Lab 10/08/14 1508 10/09/14 0340 10/10/14 0853  AMMONIA 26 41* 17   CBC:  Recent Labs Lab 10/08/14 1036  10/10/14 0853 10/11/14 0531 10/12/14 0537 November 03, 2014 0545 10/14/14 0541  WBC 6.3 5.9 5.3 4.8 5.2 6.1  NEUTROABS 3.1 2.8  --  1.4*  --   --   HGB 13.5 14.4 12.7* 11.6* 11.8* 12.8*  HCT 38.2* 40.7 36.5* 34.1* 34.0* 36.3*  MCV 98.2 99.3 99.7 98.6 97.7 97.6  PLT 148* 166 172 148* 155 170   Cardiac Enzymes: No results for input(s): CKTOTAL, CKMB, CKMBINDEX, TROPONINI in the last 168 hours. BNP (last 3 results) No results for input(s): BNP in the last 8760 hours.  ProBNP (last 3 results) No results for input(s): PROBNP in the last 8760 hours.  CBG:  Recent Labs Lab 10/08/14 1059  GLUCAP 108*  108*    Recent Results (from the past 240 hour(s))  MRSA PCR Screening     Status: None   Collection Time: 10/08/14  4:46 PM  Result Value Ref Range Status   MRSA by PCR NEGATIVE NEGATIVE Final    Comment:        The GeneXpert MRSA Assay (FDA approved for NASAL specimens only), is one component of a comprehensive MRSA colonization surveillance program. It is not intended to diagnose MRSA infection nor to guide or monitor treatment for MRSA infections.   Urine culture     Status: None   Collection Time: 10/08/14  6:03 PM  Result Value Ref Range Status   Specimen Description URINE, CLEAN CATCH  Final   Special Requests NONE  Final   Culture   Final    MULTIPLE SPECIES PRESENT, SUGGEST RECOLLECTION Performed at [facility]    Report Status 10/10/2014 FINAL  Final     Studies: Dg Elbow Complete Left  2014-11-03   CLINICAL DATA:  Fall 3 days ago. Left elbow pain and swelling. Initial encounter.  EXAM: LEFT ELBOW - COMPLETE 3+ VIEW  COMPARISON:  10/29/2012  FINDINGS: Prominent soft tissue swelling is noted. Nondisplaced fracture of the proximal ulna seen with intra-articular extension. No other fractures identified. No evidence of dislocation. Mild degenerative spurring seen mainly involving the proximal ulna.  IMPRESSION: Nondisplaced proximal ulnar fracture with  intra-articular extension.   Electronically Signed   By: Myles Rosenthal M.D.   On: 11/03/14 15:24    Scheduled Meds: . amLODipine  5 mg Oral Daily  . carbamazepine  400 mg Oral TID  . folic acid  1 mg Oral Daily  . ibuprofen  400 mg Oral TID  . levETIRAcetam  1,000 mg Oral BID  . levofloxacin  750 mg Oral q1800  . metoprolol  25 mg Oral BID  . nicotine  21 mg Transdermal Daily  . pantoprazole  40 mg Oral Daily  . pneumococcal 23 valent vaccine  0.5 mL Intramuscular Tomorrow-1000  . pregabalin  75 mg Oral BID  . thiamine  100 mg Oral Daily  . traMADol  100 mg Oral 3 times per day   Continuous Infusions:    Time spent: 25 minutes    Alondria Mousseau  [facility] Pager [phone]. If 7PM-7AM, please contact night-coverage at [email or web address], password [facility] 10/14/2014, 6:03 PM  LOS: 6 days

## 2014-10-15 DIAGNOSIS — E871 Hypo-osmolality and hyponatremia: Secondary | ICD-10-CM

## 2014-10-15 DIAGNOSIS — K769 Liver disease, unspecified: Secondary | ICD-10-CM

## 2014-10-15 DIAGNOSIS — S52202K Unspecified fracture of shaft of left ulna, subsequent encounter for closed fracture with nonunion: Secondary | ICD-10-CM | POA: Diagnosis present

## 2014-10-15 DIAGNOSIS — K7290 Hepatic failure, unspecified without coma: Secondary | ICD-10-CM

## 2014-10-15 DIAGNOSIS — R4182 Altered mental status, unspecified: Secondary | ICD-10-CM | POA: Diagnosis present

## 2014-10-15 LAB — COMPREHENSIVE METABOLIC PANEL
ALK PHOS: 74 U/L (ref 38–126)
ALT: 15 U/L — AB (ref 17–63)
ANION GAP: 8 (ref 5–15)
AST: 18 U/L (ref 15–41)
Albumin: 3.8 g/dL (ref 3.5–5.0)
BILIRUBIN TOTAL: 0.7 mg/dL (ref 0.3–1.2)
BUN: 11 mg/dL (ref 6–20)
CHLORIDE: 90 mmol/L — AB (ref 101–111)
CO2: 26 mmol/L (ref 22–32)
CREATININE: 0.68 mg/dL (ref 0.61–1.24)
Calcium: 8.7 mg/dL — ABNORMAL LOW (ref 8.9–10.3)
GFR calc Af Amer: >60 mL/min (ref >60)
GLUCOSE: 113 mg/dL — AB (ref 65–99)
Potassium: 4.6 mmol/L (ref 3.5–5.1)
Sodium: 124 mmol/L — ABNORMAL LOW (ref 135–145)
TOTAL PROTEIN: 8 g/dL (ref 6.5–8.1)

## 2014-10-15 LAB — OSMOLALITY, URINE: Osmolality, Ur: 496 mOsm/kg (ref 390–1090)

## 2014-10-15 LAB — CBC
HEMATOCRIT: 37.9 % — AB (ref 39.0–52.0)
Hemoglobin: 13.5 g/dL (ref 13.0–17.0)
MCH: 34.4 pg — AB (ref 26.0–34.0)
MCHC: 35.6 g/dL (ref 30.0–36.0)
MCV: 96.7 fL (ref 78.0–100.0)
PLATELETS: 153 10*3/uL (ref 150–400)
RBC: 3.92 MIL/uL — AB (ref 4.22–5.81)
RDW: 11.6 % (ref 11.5–15.5)
WBC: 7.8 10*3/uL (ref 4.0–10.5)

## 2014-10-15 LAB — OSMOLALITY: Osmolality: 266 mOsm/kg — ABNORMAL LOW (ref 275–300)

## 2014-10-15 LAB — URIC ACID: Uric Acid, Serum: 2 mg/dL — ABNORMAL LOW (ref 4.4–7.6)

## 2014-10-15 MED ORDER — KETOROLAC TROMETHAMINE 15 MG/ML IJ SOLN
15.0000 mg | Freq: Four times a day (QID) | INTRAMUSCULAR | Status: DC | PRN
Start: 2014-10-15 — End: 2014-10-20
  Administered 2014-10-16 – 2014-10-19 (×7): 15 mg via INTRAVENOUS
  Filled 2014-10-15 (×9): qty 1

## 2014-10-15 MED ORDER — ENSURE ENLIVE PO LIQD
237.0000 mL | Freq: Two times a day (BID) | ORAL | Status: DC
  Administered 2014-10-16 – 2014-10-20 (×8): 237 mL via ORAL

## 2014-10-15 NOTE — Progress Notes (Addendum)
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 Admitted to stepdown. During the Hospital stay pt found to have non displaced proximal ulnar fracture.    Assessment/ plan Acute metabolic encephalopathy secondary to tegretol toxicity/ hyponatremia/ hepatic encephaloapthy  Seizure disorder  on tegretol and  keppra as outpt which has been resumed. Repeat tegretol level in am. If Na still low will d/w neurology and may need to d/c tegretol and increase keppra dose.   Hyponatremia  possibly SIADH vs tegretol toxicity. . Low serum osm. And low normal urine osm. restrict fluid to 1200cc/ day. Monitor Na and tegretol level in am.  Chronic liver disease/Chronic hepatitis C /mild hepatic encephalopathy Ammonia level normal.  continue Thiamine/folate   Essential hypertension, benign BP stable. Continue Norvasc   Left ulnar fracture and displaced Long-arm splint. Follow with orthopedics at discharge [name]). Will switch ultram to toradol given risk of lowering seizure threshold with ultram.  etoh and tobacco abuse counseled on cessation. On nicotine patch  DVT Prophylaxis Sq Lovenox   Family Communication:  NONE at bedside.    Code Status   Full Code   DISPO  home once Na level improves.  refuses SNF  Consultants:  Neurology  Procedures: Left long-arm splint  Antibiotics: none  HPI/Subjective: Seen and examined. Appears somewhat confused ?? Baseline. C/o left arm pain  Objective: Filed Vitals:   10/15/14 1414  BP: 125/72  Pulse: 66  Temp: 97.7 F  (36.5 C)  Resp: 16    Intake/Output Summary (Last 24 hours) at 10/15/14 1541 Last data filed at 10/15/14 1312  Gross per 24 hour  Intake    480 ml  Output   2250 ml  Net  -1770 ml   Filed Weights   10/08/14 1645  Weight: 89.7 kg (197 lb 12 oz)    Exam:   General:  NAD  HEENT: no pallor, moist mucosa  Chest: clear b/l, no added sounds   CVS: NS1&S2  GI: soft, NT, ND, BS+  Musculoskeletal: warm, no edema, left forearm cast  CNS: AAOX2, no tremors     Data Reviewed: Basic Metabolic Panel:  Recent Labs Lab 10/09/14 0340  10/11/14 0531 10/12/14 0537 10/13/14 0545 10/14/14 0541 10/15/14 0610  NA 129*  < > 133* 133* 127* 126* 124*  K 4.0  < > 4.2 4.2 4.1 4.0 4.6  CL 97*  < > 98* 99* 96* 93* 90*  CO2 25  < > GLUCOSE 104*  < > 99 96 89 112* 113*  BUN 10  < > CREATININE 0.64  < > 0.71 0.74 0.72 0.70 0.68  CALCIUM 8.4*  < > 8.6* 8.5* 7.9* 8.5* 8.7*  MG 1.8  --   --   --   --   --   --   PHOS 3.9  --   --   --   --   --   --   < > = values in this interval not displayed. Liver Function Tests:  Recent Labs Lab 10/09/14 0340 10/10/14 0853 10/12/14 0537 10/13/14 0545 10/15/14 0610  AST 19  18  ALT 18 18 16* 14* 15*  ALKPHOS 70 78 69 65 74  BILITOT 0.7 0.8 0.6 0.6 0.7  PROT 6.8 8.1 6.6 6.6 8.0  ALBUMIN 3.4* 3.9 3.4* 3.3* 3.8   No results for input(s): LIPASE, AMYLASE in the last 168 hours.  Recent Labs Lab 10/09/14 0340 10/10/14 0853  AMMONIA 41* 17   CBC:  Recent Labs Lab 10/10/14 0853 10/11/14 0531 10/12/14 0537 10/13/14 0545 10/14/14 0541 10/15/14 0610  WBC 5.9 5.3 4.8 5.2 6.1 7.8  NEUTROABS 2.8  --  1.4*  --   --   --   HGB 14.4 12.7* 11.6* 11.8* 12.8* 13.5  HCT 40.7 36.5* 34.1* 34.0* 36.3* 37.9*  MCV 99.3 99.7 98.6 97.7 97.6 96.7  PLT 166 172 148* 155 170 153   Cardiac Enzymes: No results for input(s): CKTOTAL, CKMB, CKMBINDEX, TROPONINI in the last 168 hours. BNP (last 3  results)  Recent Labs  10/14/14 1825  BNP 64.4    ProBNP (last 3 results) No results for input(s): PROBNP in the last 8760 hours.  CBG: No results for input(s): GLUCAP in the last 168 hours.  Recent Results (from the past 240 hour(s))  MRSA PCR Screening     Status: None   Collection Time: 10/08/14  4:46 PM  Result Value Ref Range Status   MRSA by PCR NEGATIVE NEGATIVE Final    Comment:        The GeneXpert MRSA Assay (FDA approved for NASAL specimens only), is one component of a comprehensive MRSA colonization surveillance program. It is not intended to diagnose MRSA infection nor to guide or monitor treatment for MRSA infections.   Urine culture     Status: None   Collection Time: 10/08/14  6:03 PM  Result Value Ref Range Status   Specimen Description URINE, CLEAN CATCH  Final   Special Requests NONE  Final   Culture   Final    MULTIPLE SPECIES PRESENT, SUGGEST RECOLLECTION Performed at [facility]    Report Status 10/10/2014 FINAL  Final     Studies: No results found.  Scheduled Meds: . amLODipine  5 mg Oral Daily  . carbamazepine  400 mg Oral TID  . folic acid  1 mg Oral Daily  . ibuprofen  400 mg Oral TID  . levETIRAcetam  1,000 mg Oral BID  . metoprolol  25 mg Oral BID  . nicotine  21 mg Transdermal Daily  . pantoprazole  40 mg Oral Daily  . pneumococcal 23 valent vaccine  0.5 mL Intramuscular Tomorrow-1000  . pregabalin  75 mg Oral BID  . thiamine  100 mg Oral Daily  . traMADol  100 mg Oral 3 times per day   Continuous Infusions:     Time spent: 25 minutes    Marietta Sikkema  [facility] Pager [phone]. If 7PM-7AM, please contact night-coverage at [email or web address], password [facility] 10/15/2014, 3:41 PM  LOS: 7 days

## 2014-10-16 ENCOUNTER — Inpatient Hospital Stay (HOSPITAL_COMMUNITY): Payer: Medicaid Other

## 2014-10-16 DIAGNOSIS — T421X1D Poisoning by iminostilbenes, accidental (unintentional), subsequent encounter: Secondary | ICD-10-CM

## 2014-10-16 DIAGNOSIS — J209 Acute bronchitis, unspecified: Secondary | ICD-10-CM

## 2014-10-16 DIAGNOSIS — S52201F Unspecified fracture of shaft of right ulna, subsequent encounter for open fracture type IIIA, IIIB, or IIIC with routine healing: Secondary | ICD-10-CM

## 2014-10-16 DIAGNOSIS — R410 Disorientation, unspecified: Secondary | ICD-10-CM

## 2014-10-16 LAB — BASIC METABOLIC PANEL
ANION GAP: 9 (ref 5–15)
BUN: 12 mg/dL (ref 6–20)
CALCIUM: 8.6 mg/dL — AB (ref 8.9–10.3)
CO2: 24 mmol/L (ref 22–32)
CREATININE: 0.63 mg/dL (ref 0.61–1.24)
Chloride: 92 mmol/L — ABNORMAL LOW (ref 101–111)
Glucose, Bld: 116 mg/dL — ABNORMAL HIGH (ref 65–99)
Potassium: 4.4 mmol/L (ref 3.5–5.1)
Sodium: 125 mmol/L — ABNORMAL LOW (ref 135–145)

## 2014-10-16 LAB — URINALYSIS, ROUTINE W REFLEX MICROSCOPIC
GLUCOSE, UA: NEGATIVE mg/dL
HGB URINE DIPSTICK: NEGATIVE
Ketones, ur: NEGATIVE mg/dL
Nitrite: NEGATIVE
PH: 6 (ref 5.0–8.0)
Protein, ur: NEGATIVE mg/dL
Specific Gravity, Urine: 1.024 (ref 1.005–1.030)
Urobilinogen, UA: 1 mg/dL (ref 0.0–1.0)

## 2014-10-16 LAB — CARBAMAZEPINE LEVEL, TOTAL: CARBAMAZEPINE LVL: 19 ug/mL — AB (ref 4.0–12.0)

## 2014-10-16 LAB — URINE MICROSCOPIC-ADD ON

## 2014-10-16 MED ORDER — GUAIFENESIN-DM 100-10 MG/5ML PO SYRP
5.0000 mL | ORAL_SOLUTION | ORAL | Status: DC | PRN
  Administered 2014-10-16 – 2014-10-17 (×3): 5 mL via ORAL
  Filled 2014-10-16 (×4): qty 10

## 2014-10-16 MED ORDER — IBUPROFEN 800 MG PO TABS
800.0000 mg | ORAL_TABLET | Freq: Once | ORAL | Status: AC
  Administered 2014-10-16: 800 mg via ORAL
  Filled 2014-10-16: qty 1

## 2014-10-16 MED ORDER — CEFTRIAXONE SODIUM IN DEXTROSE 20 MG/ML IV SOLN
1.0000 g | INTRAVENOUS | Status: DC
  Administered 2014-10-16 – 2014-10-17 (×2): 1 g via INTRAVENOUS
  Filled 2014-10-16 (×2): qty 50

## 2014-10-16 MED ORDER — AZITHROMYCIN 500 MG PO TABS
500.0000 mg | ORAL_TABLET | Freq: Every day | ORAL | Status: AC
  Administered 2014-10-16: 500 mg via ORAL
  Filled 2014-10-16: qty 1

## 2014-10-16 MED ORDER — AZITHROMYCIN 250 MG PO TABS
250.0000 mg | ORAL_TABLET | Freq: Every day | ORAL | Status: DC
  Administered 2014-10-17: 250 mg via ORAL
  Filled 2014-10-16: qty 1

## 2014-10-16 MED ORDER — SODIUM CHLORIDE 0.9 % IV BOLUS (SEPSIS)
250.0000 mL | Freq: Once | INTRAVENOUS | Status: AC
  Administered 2014-10-16: 250 mL via INTRAVENOUS

## 2014-10-16 NOTE — Progress Notes (Signed)
PT Cancellation Note  Patient Details Name: Jaquel Glassburn MRN: [id number] DOB: 09-13-1960   Cancelled Treatment:     Pt refused to participate despite several attempts.  "I can't walk" pt stated as he held up his L arm.  Pt became increasingly aggitated and loud.     Armando Reichert 10/16/2014, 12:04 PM

## 2014-10-16 NOTE — Progress Notes (Signed)
Date:  October 16, 2014 U.R. performed for needs and level of care. Will continue to follow for Case Management needs.  Seretha Estabrooks, RN, BSN, CCM   [phone] 

## 2014-10-16 NOTE — Progress Notes (Signed)
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 Admitted to stepdown. During the Hospital stay pt found to have non displaced proximal ulnar fracture.    Assessment/ plan Acute metabolic encephalopathy secondary to tegretol toxicity/ hyponatremia/ hepatic encephaloapthy. Appears to be slowly resolving.  Seizure disorder  on tegretol and  keppra as outpt. Patient presented with supratherapeutic Tegretol toxicity which was held on admission and resumed after repeats levels were subtherapeutic. His Tegretol levels rechecked today are again supratherapeutic and I have discontinued it. Neurology has been consulted again for further recommendations given elevated Tegretol and persistent hyponatremia. I'm also concerned patient would be clear about his medication instructions.   Hyponatremia  possibly SIADH vs tegretol toxicity. . Low serum osm and  low normal urine osm. Continue to restrict fluid to 1200cc/ day. Monitor  sodium level closely.  Fever with possible acute bronchitis tmax of 100.7. Patient having persistent productive cough since yesterday. His x-ray negative for infiltrate. Have added Z-Pak for possible bronchitis. Check UA   Chronic liver disease/Chronic hepatitis C /mild hepatic encephalopathy Ammonia level normal.  continue Thiamine/folate   Essential hypertension, benign BP stable. Continue Norvasc   Left ulnar fracture and displaced Long-arm splint. Follow with orthopedics at discharge [name]). Switched ultram to toradol given risk of lowering seizure  threshold with ultram.  etoh and tobacco abuse counseled on cessation. On nicotine patch  DVT Prophylaxis Sq Lovenox   Family Communication:  None  at bedside.    Code Status   Full Code   DISPO  home with home health once Na level improves.  refuses SNF  Consultants:  Neurology  Procedures: Left long-arm splint  Antibiotics: none  HPI/Subjective: Patient complaining of productive cough yellowish phlegm since yesterday. Was febrile to 100.55F as well.  Objective: Filed Vitals:   10/16/14 0626  BP:   Pulse:   Temp: 99 F (37.2 C)  Resp:     Intake/Output Summary (Last 24 hours) at 10/16/14 1126 Last data filed at 10/16/14 0801  Gross per 24 hour  Intake    600 ml  Output    800 ml  Net   -200 ml   Filed Weights   10/08/14 1645  Weight: 89.7 kg (197 lb 12 oz)    Exam:   General:  NAD, coughing vigorously in the room.  HEENT: no pallor, moist mucosa  Chest: clear b/l, no added sounds   CVS: NS1&S2  GI: soft, NT, ND, BS+  Musculoskeletal: warm, no edema, left forearm cast  CNS: AAOX2, no tremors     Data Reviewed: Basic Metabolic Panel:  Recent Labs Lab 10/12/14 0537 10/13/14 0545 10/14/14 0541 10/15/14 0610 10/16/14 0545  NA 133* 127* 126* 124* 125*  K 4.2 4.1 4.0 4.6 4.4  CL 99* 96* 93* 90* 92*  CO2 28 26 26 26 24   GLUCOSE 96 89 112* 113* 116*  BUN 10 11 10 11 12   CREATININE 0.74 0.72 0.70 0.68 0.63  CALCIUM 8.5* 7.9* 8.5* 8.7* 8.6*   Liver Function Tests:  Recent Labs Lab 10/10/14 0853 10/12/14 0537 10/13/14 0545 10/15/14 0610  AST 25 20 19 18   ALT 18 16* 14* 15*  ALKPHOS  78 69 65 74  BILITOT 0.8 0.6 0.6 0.7  PROT 8.1 6.6 6.6 8.0  ALBUMIN 3.9 3.4* 3.3* 3.8   No results for input(s): LIPASE, AMYLASE in the last 168 hours.  Recent Labs Lab 10/10/14 0853  AMMONIA 17   CBC:  Recent Labs Lab 10/10/14 0853 10/11/14 0531 10/12/14 0537 10/13/14 0545 10/14/14 0541 10/15/14 0610  WBC 5.9 5.3 4.8 5.2  6.1 7.8  NEUTROABS 2.8  --  1.4*  --   --   --   HGB 14.4 12.7* 11.6* 11.8* 12.8* 13.5  HCT 40.7 36.5* 34.1* 34.0* 36.3* 37.9*  MCV 99.3 99.7 98.6 97.7 97.6 96.7  PLT 166 172 148* 155 170 153   Cardiac Enzymes: No results for input(s): CKTOTAL, CKMB, CKMBINDEX, TROPONINI in the last 168 hours. BNP (last 3 results)  Recent Labs  10/14/14 1825  BNP 64.4    ProBNP (last 3 results) No results for input(s): PROBNP in the last 8760 hours.  CBG: No results for input(s): GLUCAP in the last 168 hours.  Recent Results (from the past 240 hour(s))  MRSA PCR Screening     Status: None   Collection Time: 10/08/14  4:46 PM  Result Value Ref Range Status   MRSA by PCR NEGATIVE NEGATIVE Final    Comment:        The GeneXpert MRSA Assay (FDA approved for NASAL specimens only), is one component of a comprehensive MRSA colonization surveillance program. It is not intended to diagnose MRSA infection nor to guide or monitor treatment for MRSA infections.   Urine culture     Status: None   Collection Time: 10/08/14  6:03 PM  Result Value Ref Range Status   Specimen Description URINE, CLEAN CATCH  Final   Special Requests NONE  Final   Culture   Final    MULTIPLE SPECIES PRESENT, SUGGEST RECOLLECTION Performed at [facility]    Report Status 10/10/2014 FINAL  Final     Studies: Dg Chest Port 1 View  10/16/2014   CLINICAL DATA:  One day history of fever.  Cough and congestion.  EXAM: PORTABLE CHEST - 1 VIEW  COMPARISON:  Chest radiograph October 09, 2014 and chest CT October 11, 2014  FINDINGS: There is no edema or consolidation. The heart size and pulmonary vascularity are normal. No adenopathy. There is evidence of old rib trauma on the right.  IMPRESSION: No edema or consolidation.   Electronically Signed   By: Bretta Bang III M.D.   On: 10/16/2014 08:11    Scheduled Meds: . amLODipine  5 mg Oral Daily  . [START ON 10/17/2014] azithromycin  250 mg Oral Daily  .  carbamazepine  400 mg Oral TID  . feeding supplement (ENSURE ENLIVE)  237 mL Oral BID BM  . folic acid  1 mg Oral Daily  . levETIRAcetam  1,000 mg Oral BID  . metoprolol  25 mg Oral BID  . nicotine  21 mg Transdermal Daily  . pantoprazole  40 mg Oral Daily  . pneumococcal 23 valent vaccine  0.5 mL Intramuscular Tomorrow-1000  . pregabalin  75 mg Oral BID  . thiamine  100 mg Oral Daily   Continuous Infusions:     Time spent: 25 minutes    Maryl Blalock  [facility] Pager [phone]. If 7PM-7AM, please contact night-coverage at [email or web address], password [facility] 10/16/2014, 11:26 AM  LOS: 8 days

## 2014-10-16 NOTE — Progress Notes (Signed)
Subjective: No further seizures noted.  Continues to be encephalopathic but no longer requires a sitter.  Remains toxic on his Tegretol.  Mild hyponatremia remains.     Objective: Current vital signs: BP 145/78 mmHg  Pulse 85  Temp(Src) 102.9 F (39.4 C) (Oral)  Resp 18  Ht 6\' 2"  (1.88 m)  Wt 89.7 kg (197 lb 12 oz)  BMI 25.38 kg/m2  SpO2 97% Vital signs in last 24 hours: Temp:  [99 F (37.2 C)-102.9 F (39.4 C)] 102.9 F (39.4 C) (07/28 1448) Pulse Rate:  [84-86] 85 (07/28 1448) Resp:  [16-18] 18 (07/28 1448) BP: (141-152)/(69-81) 145/78 mmHg (07/28 1448) SpO2:  [95 %-98 %] 97 % (07/28 1448)  Intake/Output from previous day: 07/27 0701 - 07/28 0700 In: 360 [P.O.:360] Out: 1150 [Urine:1150] Intake/Output this shift: Total I/O In: 240 [P.O.:240] Out: -  Nutritional status: Diet regular Room service appropriate?: Yes; Fluid consistency:: Thin; Fluid restriction:: 1200 mL Fluid  Neurologic Exam: Mental Status: Patient awake but less than cooperative due to complaints of arm pain.  Oriented to name and place.  Follows simple commands.  Speech fluent.   Cranial Nerves: II-Blinks to bilateral confrontation III/IV/VI-Pupils were equal and reacted normally to light. Extraocular movements were full and conjugate.  V/VII-smile symmetric. VIII-grossly intact XII-midline tongue extension. Motor: Moves all extremities against gravity Deep Tendon Reflexes: 1+ and symmetric in upper extremities and trace only at the knees. DTR's: Mute bilaterally  Lab Results: Basic Metabolic Panel:  Recent Labs Lab 10/12/14 0537 10/13/14 0545 10/14/14 0541 10/15/14 0610 10/16/14 0545  NA 133* 127* 126* 124* 125*  K 4.2 4.1 4.0 4.6 4.4  CL 99* 96* 93* 90* 92*  CO2 28 26 26 26 24   GLUCOSE 96 89 112* 113* 116*  BUN 10 11 10 11 12   CREATININE 0.74 0.72 0.70 0.68 0.63  CALCIUM 8.5* 7.9* 8.5* 8.7* 8.6*    Liver Function Tests:  Recent Labs Lab 10/10/14 0853 10/12/14 0537  10/13/14 0545 10/15/14 0610  AST 25 20 19 18   ALT 18 16* 14* 15*  ALKPHOS 78 69 65 74  BILITOT 0.8 0.6 0.6 0.7  PROT 8.1 6.6 6.6 8.0  ALBUMIN 3.9 3.4* 3.3* 3.8   No results for input(s): LIPASE, AMYLASE in the last 168 hours.  Recent Labs Lab 10/10/14 0853  AMMONIA 17    CBC:  Recent Labs Lab 10/10/14 0853 10/11/14 0531 10/12/14 0537 10/13/14 0545 10/14/14 0541 10/15/14 0610  WBC 5.9 5.3 4.8 5.2 6.1 7.8  NEUTROABS 2.8  --  1.4*  --   --   --   HGB 14.4 12.7* 11.6* 11.8* 12.8* 13.5  HCT 40.7 36.5* 34.1* 34.0* 36.3* 37.9*  MCV 99.3 99.7 98.6 97.7 97.6 96.7  PLT 166 172 148* 155 170 153    Cardiac Enzymes: No results for input(s): CKTOTAL, CKMB, CKMBINDEX, TROPONINI in the last 168 hours.  Lipid Panel: No results for input(s): CHOL, TRIG, HDL, CHOLHDL, VLDL, LDLCALC in the last 168 hours.  CBG: No results for input(s): GLUCAP in the last 168 hours.  Microbiology: Results for orders placed or performed during the hospital encounter of 10/08/14  MRSA PCR Screening     Status: None   Collection Time: 10/08/14  4:46 PM  Result Value Ref Range Status   MRSA by PCR NEGATIVE NEGATIVE Final    Comment:        The GeneXpert MRSA Assay (FDA approved for NASAL specimens only), is one component of a comprehensive MRSA colonization surveillance  program. It is not intended to diagnose MRSA infection nor to guide or monitor treatment for MRSA infections.   Urine culture     Status: None   Collection Time: 10/08/14  6:03 PM  Result Value Ref Range Status   Specimen Description URINE, CLEAN CATCH  Final   Special Requests NONE  Final   Culture   Final    MULTIPLE SPECIES PRESENT, SUGGEST RECOLLECTION Performed at [facility]    Report Status 10/10/2014 FINAL  Final    Coagulation Studies: No results for input(s): LABPROT, INR in the last 72 hours.  Imaging: Dg Chest Port 1 View  10/16/2014   CLINICAL DATA:  One day history of fever.  Cough and  congestion.  EXAM: PORTABLE CHEST - 1 VIEW  COMPARISON:  Chest radiograph October 09, 2014 and chest CT October 11, 2014  FINDINGS: There is no edema or consolidation. The heart size and pulmonary vascularity are normal. No adenopathy. There is evidence of old rib trauma on the right.  IMPRESSION: No edema or consolidation.   Electronically Signed   By: Bretta Bang III M.D.   On: 10/16/2014 08:11    Medications:  I have reviewed the patient's current medications. Scheduled: . amLODipine  5 mg Oral Daily  . [START ON 10/17/2014] azithromycin  250 mg Oral Daily  . feeding supplement (ENSURE ENLIVE)  237 mL Oral BID BM  . folic acid  1 mg Oral Daily  . levETIRAcetam  1,000 mg Oral BID  . metoprolol  25 mg Oral BID  . nicotine  21 mg Transdermal Daily  . pantoprazole  40 mg Oral Daily  . pneumococcal 23 valent vaccine  0.5 mL Intramuscular Tomorrow-1000  . pregabalin  75 mg Oral BID  . thiamine  100 mg Oral Daily    Assessment/Plan: Patient without further seizure activity noted.  Has only been therapeutic on his Tegretol for a short period of time which has likely affected his ability to regain a normal sodium.  Remains mildly hyponatremic.  Tegretol level 19.    Recommendations: 1. Hold Tegretol 2. Daily Tegretol levels.  Once patient returns to a therapeutic level would restart Tegretol at  TID.  With improvement in Tegretol level and sodium level would expect mental stat US to improve as well if this can be maintained.   3. Continue Keppra and Lyrica at current doses.   4. Patient stable neurologically at this time.  Neurology will follow up again on Monday.  If there are any questions in the interim, the [facility] as listed on Amion may be called.     LOS: 8 days   Thana Farr, MD [facility] [phone] 10/16/2014  3:32 PM

## 2014-10-16 NOTE — Progress Notes (Signed)
Nutrition Brief Note  Patient identified on the Malnutrition Screening Tool (MST) Report  Wt Readings from Last 15 Encounters:  10/08/14 197 lb 12 oz (89.7 kg)  09/10/14 207 lb (93.895 kg)  09/03/14 207 lb 3.2 oz (93.985 kg)  08/05/14 211 lb (95.709 kg)  07/02/14 206 lb (93.441 kg)  06/12/14 217 lb (98.431 kg)  05/07/14 206 lb (93.441 kg)  04/08/14 206 lb 12.8 oz (93.804 kg)  03/10/14 200 lb 12.8 oz (91.082 kg)  01/23/14 212 lb (96.163 kg)  09/04/13 179 lb 9.6 oz (81.466 kg)  11/28/12 201 lb (91.173 kg)  10/25/12 180 lb 8.9 oz (81.9 kg)  12/28/11 195 lb (88.451 kg)    Body mass index is 25.38 kg/(m^2). Patient meets criteria for overweight status based on current BMI.   Pt states he ate breakfast this AM and chart review indicates 100% intake of this meal. He states that he did not eat anything yesterday but is unsure why he did not. Chart review indicates 75-100% intakes since admission. Pt reports good appetite PTA and that he has food available to him at home. He states that he weighed 255 lbs and lost weight but unable to give further information on this. Per weight chart above, pt has lost 10 lbs (5% body weight) in the past 1 month which is significant for time frame. Pt with hx of alcohol abuse. Meeting needs since admission.  Current diet order is Regular, patient is consuming approximately 100% of meals at this time. Labs and medications reviewed.   No nutrition interventions warranted at this time. If nutrition issues arise, please consult RD.      Trenton Gammon, RD, LDN Inpatient Clinical Dietitian Pager # [phone] After hours/weekend pager # [phone]

## 2014-10-16 NOTE — Progress Notes (Signed)
.  CRITICAL VALUE ALERT  Critical value received:  Tegretol 19.0  Date of notification:  10/16/14  Time of notification:  0845  Critical value read back:Yes.    Nurse who received alert:  Viviana Simpler  MD notified (1st page):  Dhungel  Time of first page:  0900  MD notified (2nd page):  Time of second page:  Responding MD:    Time MD responded:

## 2014-10-17 DIAGNOSIS — G9341 Metabolic encephalopathy: Principal | ICD-10-CM

## 2014-10-17 DIAGNOSIS — N390 Urinary tract infection, site not specified: Secondary | ICD-10-CM

## 2014-10-17 LAB — BASIC METABOLIC PANEL
Anion gap: 9 (ref 5–15)
BUN: 15 mg/dL (ref 6–20)
CO2: 26 mmol/L (ref 22–32)
CREATININE: 0.8 mg/dL (ref 0.61–1.24)
Calcium: 8.7 mg/dL — ABNORMAL LOW (ref 8.9–10.3)
Chloride: 91 mmol/L — ABNORMAL LOW (ref 101–111)
GFR calc Af Amer: >60 mL/min (ref >60)
GFR calc non Af Amer: >60 mL/min (ref >60)
Glucose, Bld: 118 mg/dL — ABNORMAL HIGH (ref 65–99)
Potassium: 4.2 mmol/L (ref 3.5–5.1)
Sodium: 126 mmol/L — ABNORMAL LOW (ref 135–145)

## 2014-10-17 LAB — URINE CULTURE: CULTURE: NO GROWTH

## 2014-10-17 LAB — CARBAMAZEPINE LEVEL, TOTAL: Carbamazepine Lvl: 13.1 ug/mL — ABNORMAL HIGH (ref 4.0–12.0)

## 2014-10-17 MED ORDER — LEVOFLOXACIN IN D5W 750 MG/150ML IV SOLN
750.0000 mg | INTRAVENOUS | Status: DC
  Administered 2014-10-18: 750 mg via INTRAVENOUS
  Filled 2014-10-17 (×2): qty 150

## 2014-10-17 MED ORDER — VANCOMYCIN HCL IN DEXTROSE 1-5 GM/200ML-% IV SOLN
1000.0000 mg | INTRAVENOUS | Status: AC
  Administered 2014-10-17: 1000 mg via INTRAVENOUS
  Filled 2014-10-17: qty 200

## 2014-10-17 MED ORDER — VANCOMYCIN HCL IN DEXTROSE 1-5 GM/200ML-% IV SOLN
1000.0000 mg | Freq: Three times a day (TID) | INTRAVENOUS | Status: DC
  Administered 2014-10-18 – 2014-10-20 (×8): 1000 mg via INTRAVENOUS
  Filled 2014-10-17 (×9): qty 200

## 2014-10-17 MED ORDER — SODIUM CHLORIDE 0.9 % IV SOLN
2000.0000 mg | Freq: Once | INTRAVENOUS | Status: DC
  Filled 2014-10-17: qty 2000

## 2014-10-17 MED ORDER — LORAZEPAM 2 MG/ML IJ SOLN
1.0000 mg | Freq: Once | INTRAMUSCULAR | Status: AC
  Administered 2014-10-17: 1 mg via INTRAVENOUS
  Filled 2014-10-17: qty 1

## 2014-10-17 NOTE — Progress Notes (Signed)
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 Admitted to stepdown. During the Hospital stay pt found to have non displaced proximal ulnar fracture. Hospital course prolonged due to hyponatremia and  fever on 7/28.   Assessment/ plan Acute metabolic encephalopathy secondary to tegretol toxicity/ hyponatremia/ hepatic encephaloapthy. Also developed fever on 7/28 likely due to acute bronchitis and UTI. Appears to be slowly resolving.  Seizure disorder  on tegretol and  keppra as outpt. Patient presented with supratherapeutic Tegretol  which was held on admission and resumed after repeat levels were subtherapeutic.  Tegretol level be checked 7/28 and was again supratherapeutic (17). Neurology was reconsulted with recommend holding Tegretol and once level therapeutic recommend to restart it at 200 mg 3 times a day. Tegretol level still supratherapeutic but improving (13 today). Recommend to continue Keppra and Lyrica at current doses.   Hyponatremia  possibly SIADH vs tegretol toxicity. . Low serum osm and  low normal urine osm. Continue to restrict fluid to 1200cc/ day. Sodium of 126 today.  Consult renal if hyponatremia persists despite fluid restrictions and tegretol  levels normalized.  Fever with possible acute bronchitis and UTI on 7/28 Started on Rocephin and Z-Pak. Follow urine culture. Chest x-ray negative for infiltrate. Remains afebrile past 24 hours.   Chronic liver disease/Chronic hepatitis C /mild hepatic encephalopathy Ammonia level normal.  continue Thiamine/folate   Essential hypertension, benign BP stable. Continue Norvasc   Left ulnar  fracture and displaced Long-arm splint. Follow with orthopedics at discharge [name]). Switched ultram to toradol given risk of lowering seizure threshold with ultram.  etoh and tobacco abuse counseled on cessation. On nicotine patch  DVT Prophylaxis Sq Lovenox   Family Communication:  None  at bedside.    Code Status   Full Code   DISPO  home with home health once Na level improves.  refuses SNF. Possibly early next week.  Consultants:  Neurology  Procedures: Left long-arm splint  Antibiotics: none  HPI/Subjective: Patient still complains of cough but his symptoms appears to have improved. Remains afebrile since yesterday. Appears more oriented today.  Objective: Filed Vitals:   10/17/14 0457  BP: 116/54  Pulse: 71  Temp: 98.6 F (37 C)  Resp: 16    Intake/Output Summary (Last 24 hours) at 10/17/14 1402 Last data filed at 10/17/14 1225  Gross per 24 hour  Intake    530 ml  Output    300 ml  Net    230 ml   Filed Weights   10/08/14 1645  Weight: 89.7 kg (197 lb 12 oz)    Exam:   General:  NAD, coughing vigorously in the room.  HEENT: no pallor, moist mucosa  Chest: clear b/l, no added sounds   CVS: NS1&S2  GI: soft, NT, ND, BS+  Musculoskeletal: warm, no edema, left forearm cast  CNS: AAOX2, no tremors     Data Reviewed: Basic Metabolic Panel:  Recent Labs Lab 10/13/14 0545 10/14/14 0541 10/15/14 0610 10/16/14 0545 10/17/14 0444  NA 127* 126* 124* 125* 126*  K 4.1 4.0 4.6 4.4 4.2  CL 96* 93* 90* 92* 91*  CO2 26 26 26 24 26   GLUCOSE 89 112* 113* 116* 118*  BUN 11 10 11 12  15  CREATININE 0.72 0.70 0.68 0.63 0.80  CALCIUM 7.9* 8.5* 8.7* 8.6* 8.7*   Liver Function Tests:  Recent Labs Lab 10/12/14 0537 10/13/14 0545 10/15/14 0610  AST 20 19 18   ALT 16* 14* 15*  ALKPHOS 69 65 74  BILITOT 0.6 0.6 0.7  PROT 6.6 6.6 8.0  ALBUMIN 3.4* 3.3* 3.8   No results for input(s): LIPASE, AMYLASE in the  last 168 hours. No results for input(s): AMMONIA in the last 168 hours. CBC:  Recent Labs Lab 10/11/14 0531 10/12/14 0537 10/13/14 0545 10/14/14 0541 10/15/14 0610  WBC 5.3 4.8 5.2 6.1 7.8  NEUTROABS  --  1.4*  --   --   --   HGB 12.7* 11.6* 11.8* 12.8* 13.5  HCT 36.5* 34.1* 34.0* 36.3* 37.9*  MCV 99.7 98.6 97.7 97.6 96.7  PLT 172 148* 155 170 153   Cardiac Enzymes: No results for input(s): CKTOTAL, CKMB, CKMBINDEX, TROPONINI in the last 168 hours. BNP (last 3 results)  Recent Labs  10/14/14 1825  BNP 64.4    ProBNP (last 3 results) No results for input(s): PROBNP in the last 8760 hours.  CBG: No results for input(s): GLUCAP in the last 168 hours.  Recent Results (from the past 240 hour(s))  MRSA PCR Screening     Status: None   Collection Time: 10/08/14  4:46 PM  Result Value Ref Range Status   MRSA by PCR NEGATIVE NEGATIVE Final    Comment:        The GeneXpert MRSA Assay (FDA approved for NASAL specimens only), is one component of a comprehensive MRSA colonization surveillance program. It is not intended to diagnose MRSA infection nor to guide or monitor treatment for MRSA infections.   Urine culture     Status: None   Collection Time: 10/08/14  6:03 PM  Result Value Ref Range Status   Specimen Description URINE, CLEAN CATCH  Final   Special Requests NONE  Final   Culture   Final    MULTIPLE SPECIES PRESENT, SUGGEST RECOLLECTION Performed at [facility]    Report Status 10/10/2014 FINAL  Final  Urine culture     Status: None   Collection Time: 10/16/14 11:24 AM  Result Value Ref Range Status   Specimen Description URINE, RANDOM  Final   Special Requests NONE  Final   Culture   Final    NO GROWTH 1 DAY Performed at [facility]    Report Status 10/17/2014 FINAL  Final     Studies: Dg Chest Port 1 View  10/16/2014   CLINICAL DATA:  One day history of fever.  Cough and congestion.  EXAM: PORTABLE CHEST - 1 VIEW  COMPARISON:   Chest radiograph October 09, 2014 and chest CT October 11, 2014  FINDINGS: There is no edema or consolidation. The heart size and pulmonary vascularity are normal. No adenopathy. There is evidence of old rib trauma on the right.  IMPRESSION: No edema or consolidation.   Electronically Signed   By: Bretta Bang III M.D.   On: 10/16/2014 08:11    Scheduled Meds: . amLODipine  5 mg Oral Daily  . azithromycin  250 mg Oral Daily  . cefTRIAXone (ROCEPHIN)  IV  1 g Intravenous Q24H  . feeding supplement (ENSURE ENLIVE)  237 mL Oral BID BM  . folic acid  1 mg Oral Daily  . levETIRAcetam  1,000 mg Oral BID  . metoprolol  25 mg Oral BID  . nicotine  21 mg Transdermal  Daily  . pantoprazole  40 mg Oral Daily  . pneumococcal 23 valent vaccine  0.5 mL Intramuscular Tomorrow-1000  . pregabalin  75 mg Oral BID  . thiamine  100 mg Oral Daily   Continuous Infusions:     Time spent: 25 minutes    Rafeal Skibicki  [facility] Pager [phone]. If 7PM-7AM, please contact night-coverage at [email or web address], password [facility] 10/17/2014, 2:02 PM  LOS: 9 days

## 2014-10-17 NOTE — Progress Notes (Signed)
CRITICAL VALUE ALERT  Critical value received:  Blood cx gram + cocci  Date of notification:  10/17/14  Time of notification:  1519  Critical value read back:Yes.    Nurse who received alert:  N. Sena Slate  MD notified (1st page):  Dhungel  Time of first page:  1523  MD notified (2nd page):  Time of second page:  Responding MD:    Time MD responded:

## 2014-10-17 NOTE — Progress Notes (Signed)
PT Cancellation Note  Patient Details Name: Chael Urenda MRN: [id number] DOB: 09/13/1960   Cancelled Treatment:     pt declined despite much effort.  Offered the bathroom and recliner.   Felecia Shelling  PTA [facility]  Acute  Rehab Pager      [phone]

## 2014-10-17 NOTE — Progress Notes (Signed)
ANTIBIOTIC CONSULT NOTE - INITIAL  Pharmacy Consult for Vancomycin Indication: Bacteremia  No Known Allergies  Patient Measurements: Height:  (188 cm) Weight: 197 lb 12 oz (89.7 kg) IBW/kg (Calculated) : 82.2 Adjusted Body Weight:   Vital Signs: Temp: 97.8 F (36.6 C) (07/29 1527) Temp Source: Oral (07/29 1527) BP: 127/66 mmHg (07/29 1527) Pulse Rate: 81 (07/29 1527) Intake/Output from previous day: 07/28 0701 - 07/29 0700 In: 530 [P.O.:480; IV Piggyback:50] Out: -  Intake/Output from this shift: Total I/O In: 240 [P.O.:240] Out: 300 [Urine:300]  Labs:  Recent Labs  10/15/14 0610 10/16/14 0545 10/17/14 0444  WBC 7.8  --   --   HGB 13.5  --   --   PLT 153  --   --   CREATININE 0.68 0.63 0.80   Estimated Creatinine Clearance: 121.3 mL/min (by C-G formula based on Cr of 0.8). No results for input(s): VANCOTROUGH, VANCOPEAK, VANCORANDOM, GENTTROUGH, GENTPEAK, GENTRANDOM, TOBRATROUGH, TOBRAPEAK, TOBRARND, AMIKACINPEAK, AMIKACINTROU, AMIKACIN in the last 72 hours.   Microbiology: Recent Results (from the past 720 hour(s))  MRSA PCR Screening     Status: None   Collection Time: 10/08/14  4:46 PM  Result Value Ref Range Status   MRSA by PCR NEGATIVE NEGATIVE Final    Comment:        The GeneXpert MRSA Assay (FDA approved for NASAL specimens only), is one component of a comprehensive MRSA colonization surveillance program. It is not intended to diagnose MRSA infection nor to guide or monitor treatment for MRSA infections.   Urine culture     Status: None   Collection Time: 10/08/14  6:03 PM  Result Value Ref Range Status   Specimen Description URINE, CLEAN CATCH  Final   Special Requests NONE  Final   Culture   Final    MULTIPLE SPECIES PRESENT, SUGGEST RECOLLECTION Performed at [facility]    Report Status 10/10/2014 FINAL  Final  Urine culture     Status: None   Collection Time: 10/16/14 11:24 AM  Result Value Ref Range Status   Specimen  Description URINE, RANDOM  Final   Special Requests NONE  Final   Culture   Final    NO GROWTH 1 DAY Performed at [facility]    Report Status 10/17/2014 FINAL  Final  Culture, blood (routine x 2)     Status: None (Preliminary result)   Collection Time: 10/16/14  4:15 PM  Result Value Ref Range Status   Specimen Description BLOOD RIGHT HAND  Final   Special Requests BOTTLES DRAWN AEROBIC AND ANAEROBIC 10CC  Final   Culture   Final    NO GROWTH < 24 HOURS Performed at [facility]    Report Status PENDING  Incomplete  Culture, blood (routine x 2)     Status: None (Preliminary result)   Collection Time: 10/16/14  4:25 PM  Result Value Ref Range Status   Specimen Description BLOOD RIGHT HAND  Final   Special Requests BOTTLES DRAWN AEROBIC AND ANAEROBIC 10CC  Final   Culture  Setup Time   Final    GRAM POSITIVE COCCI IN CLUSTERS AEROBIC BOTTLE ONLY CRITICAL RESULT CALLED TO, READ BACK BY AND VERIFIED WITH: N DUMAS,RN AT 1519 10/17/14 BY L BENFIELD    Culture   Final    GRAM POSITIVE COCCI Performed at [facility]    Report Status PENDING  Incomplete    Medical History: Past Medical History  Diagnosis Date  . Seizure   .  ETOHism   . Aneurysm   . Hypertension   . Hepatitis C   . Hereditary and idiopathic peripheral neuropathy 06/03/2014   Assessment: 69 yoM with hx alcoholism, hepatitis C, seizures 2/2 aneurysm rupture, and HTN presented to [facility] with acute metabolic encephalopathy due to tegretol toxicity and also found to have non displaced proximal ulnar fracture, hyponatremia, and fever.  Started on azithromycin and ceftriaxone for acute bronchitis and UTI.  Blood cultures (1 of 2) growing GPC and pharmacy consulted to start vancomycin for bacteremia and change azithromycin and ceftriaxone to levaquin.   Anti-infectives 7/28 >> Azithromycin >> 7/29 7/28 >> Ceftriazone  >> 7/29 7/29 >> Vancomycin >> 7/30 >> Levaquin >>    Vitals/Labs WBC:  WNL Tm24h: 100.5 SCr 0.8, CrCl >100 CG/N  Cultures 7/28 bloodx2: 1 of 2 GPC 7/28 urine: NGF 7/20 MRSA PCR negative  Dose changes/levels:  Goal of Therapy:  Vancomycin trough level 15-20 mcg/ml  Plan:  Day #2 antibiotics Start levaquin 750mg  IV q24h tomorrow (already received azithro/CTX 7/29) Start vancomycin 1g IV q8h F/u vancomycin trough at [facility] F/u cultures, clinical course  Haynes Hoehn, PharmD, BCPS 10/17/2014, 4:35 PM  Pager: [phone]

## 2014-10-18 DIAGNOSIS — G40909 Epilepsy, unspecified, not intractable, without status epilepticus: Secondary | ICD-10-CM

## 2014-10-18 DIAGNOSIS — K7460 Unspecified cirrhosis of liver: Secondary | ICD-10-CM

## 2014-10-18 DIAGNOSIS — A4901 Methicillin susceptible Staphylococcus aureus infection, unspecified site: Secondary | ICD-10-CM

## 2014-10-18 DIAGNOSIS — R7881 Bacteremia: Secondary | ICD-10-CM

## 2014-10-18 DIAGNOSIS — R569 Unspecified convulsions: Secondary | ICD-10-CM

## 2014-10-18 DIAGNOSIS — B9561 Methicillin susceptible Staphylococcus aureus infection as the cause of diseases classified elsewhere: Secondary | ICD-10-CM | POA: Diagnosis present

## 2014-10-18 LAB — CARBAMAZEPINE LEVEL, TOTAL: Carbamazepine Lvl: 6.4 ug/mL (ref 4.0–12.0)

## 2014-10-18 LAB — BASIC METABOLIC PANEL
Anion gap: 10 (ref 5–15)
BUN: 14 mg/dL (ref 6–20)
CO2: 26 mmol/L (ref 22–32)
Calcium: 8.5 mg/dL — ABNORMAL LOW (ref 8.9–10.3)
Chloride: 95 mmol/L — ABNORMAL LOW (ref 101–111)
Creatinine, Ser: 0.67 mg/dL (ref 0.61–1.24)
GFR calc Af Amer: >60 mL/min (ref >60)
GLUCOSE: 118 mg/dL — AB (ref 65–99)
Potassium: 4.1 mmol/L (ref 3.5–5.1)
SODIUM: 131 mmol/L — AB (ref 135–145)

## 2014-10-18 MED ORDER — CARBAMAZEPINE 200 MG PO TABS
200.0000 mg | ORAL_TABLET | Freq: Three times a day (TID) | ORAL | Status: DC
  Administered 2014-10-18 (×3): 200 mg via ORAL
  Filled 2014-10-18 (×6): qty 1

## 2014-10-18 MED ORDER — GUAIFENESIN-DM 100-10 MG/5ML PO SYRP: 10.0000 mL | ORAL_SOLUTION | ORAL | Status: DC | PRN

## 2014-10-18 NOTE — Progress Notes (Signed)
[facility] PROGRESS NOTE  [name] [name] ZOX:[id number] DOB: 1960/07/14 DOA: 10/08/2014 PCP: [name], MD  brief narrative 54 year old male who lives alone, has history of alcoholism, seizures (secondary to aneurysm rupture), essential hypertension, tobacco dependency, hepatitis C who presented with acute metabolic encephalopathy secondary to tegretol toxicity. Admitted to stepdown. During the Hospital stay pt found to have non displaced proximal ulnar fracture. Hospital course prolonged due to hyponatremia and  fever on 7/28.   Assessment/ plan Acute metabolic encephalopathy secondary to tegretol toxicity/ hyponatremia/ hepatic encephaloapthy. Also developed fever on 7/28 likely due to acute bronchitis and UTI. resolved but pt irritable and rude to the nursing staff.  Seizure disorder  on tegretol and  keppra as outpt. Patient presented with supratherapeutic Tegretol  which was held on admission and resumed after repeat levels were subtherapeutic.  Tegretol was resumed after level therapeutic when rechecked on 7/28 supratherapeutic again and held. Neurology consulted recommended checking levels daily and once therapeutic resumed dose at 200 mg 3 times a day. Level is therapeutic this morning at 6.4. I have resumed Tegretol today. Seizure activity.   Hyponatremia  possibly SIADH vs tegretol toxicity. . Low serum osm and  low normal urine osm. Continue to restrict fluid to 1200cc/ day. Improved to 131 today. Continue fluid restriction.   Fever with possible acute bronchitis and UTI on 7/28 with 1/2 blood cx positive for staph aureus On vancomycin and Levaquin. Urine culture so far negative. Suspicion for infiltrate. Follow final blood culture results.   Chronic liver disease/Chronic hepatitis C /mild hepatic encephalopathy Ammonia level normal.  continue Thiamine/folate   Essential hypertension, benign BP stable. Continue Norvasc   Left ulnar fracture and  displaced Long-arm splint. Follow with orthopedics at discharge [name] [name] and [facility]). Switched ultram to toradol given risk of lowering seizure threshold with ultram.  etoh and tobacco abuse counseled on cessation. On nicotine patch  DVT Prophylaxis Sq Lovenox   Family Communication:  None  at bedside.    Code Status   Full Code   DISPO  home with home health pending final blood cx result. possibly yon 8/1  Consultants:  Neurology  Procedures: Left long-arm splint  Antibiotics: none  HPI/Subjective: Patient still complains of cough.. Also reports having 2 episodes of seziures (since last night ) both unwitnessed. Pt irritable and rude to nursing staff saying his medications have not been addressed properly.   Objective: Filed Vitals:   10/18/14 1036  BP: 132/70  Pulse: 78  Temp:   Resp:     Intake/Output Summary (Last 24 hours) at 10/18/14 1156 Last data filed at 10/18/14 0802  Gross per 24 hour  Intake    240 ml  Output   1350 ml  Net  -1110 ml   Filed Weights   10/08/14 1645  Weight: 89.7 kg (197 lb 12 oz)    Exam:   General:  NAD, irritable  HEENT: no pallor, moist mucosa  Chest: clear b/l, no added sounds   CVS: NS1&S2  GI: soft, NT, ND, BS+  Musculoskeletal: warm, no edema, left forearm cast  CNS: AAOX2, no tremors     Data Reviewed: Basic Metabolic Panel:  Recent Labs Lab 10/14/14 0541 10/15/14 0610 10/16/14 0545 10/17/14 0444 10/18/14 0530  NA 126* 124* 125* 126* 131*  K 4.0 4.6 4.4 4.2 4.1  CL 93* 90* 92* 91* 95*  CO2 26 26 24 26 26   GLUCOSE 112* 113* 116* 118* 118*  BUN 10 11 12 15  14  CREATININE 0.70 0.68 0.63 0.80 0.67  CALCIUM 8.5* 8.7* 8.6* 8.7* 8.5*   Liver Function Tests:  Recent Labs Lab 10/12/14 0537 10/13/14 0545 10/15/14 0610  AST ALT 16* 14* 15*  ALKPHOS 69 65 74  BILITOT 0.6 0.6 0.7  PROT 6.6 6.6 8.0  ALBUMIN 3.4* 3.3* 3.8   No results for input(s): LIPASE, AMYLASE  in the last 168 hours. No results for input(s): AMMONIA in the last 168 hours. CBC:  Recent Labs Lab 10/12/14 0537 10/13/14 0545 10/14/14 0541 10/15/14 0610  WBC 4.8 5.2 6.1 7.8  NEUTROABS 1.4*  --   --   --   HGB 11.6* 11.8* 12.8* 13.5  HCT 34.1* 34.0* 36.3* 37.9*  MCV 98.6 97.7 97.6 96.7  PLT 148* 155 170 153   Cardiac Enzymes: No results for input(s): CKTOTAL, CKMB, CKMBINDEX, TROPONINI in the last 168 hours. BNP (last 3 results)  Recent Labs  10/14/14 1825  BNP 64.4    ProBNP (last 3 results) No results for input(s): PROBNP in the last 8760 hours.  CBG: No results for input(s): GLUCAP in the last 168 hours.  Recent Results (from the past 240 hour(s))  MRSA PCR Screening     Status: None   Collection Time: 10/08/14  4:46 PM  Result Value Ref Range Status   MRSA by PCR NEGATIVE NEGATIVE Final    Comment:        The GeneXpert MRSA Assay (FDA approved for NASAL specimens only), is one component of a comprehensive MRSA colonization surveillance program. It is not intended to diagnose MRSA infection nor to guide or monitor treatment for MRSA infections.   Urine culture     Status: None   Collection Time: 10/08/14  6:03 PM  Result Value Ref Range Status   Specimen Description URINE, CLEAN CATCH  Final   Special Requests NONE  Final   Culture   Final    MULTIPLE SPECIES PRESENT, SUGGEST RECOLLECTION Performed at [facility]    Report Status 10/10/2014 FINAL  Final  Urine culture     Status: None   Collection Time: 10/16/14 11:24 AM  Result Value Ref Range Status   Specimen Description URINE, RANDOM  Final   Special Requests NONE  Final   Culture   Final    NO GROWTH 1 DAY Performed at [facility]    Report Status 10/17/2014 FINAL  Final  Culture, blood (routine x 2)     Status: None (Preliminary result)   Collection Time: 10/16/14  4:15 PM  Result Value Ref Range Status   Specimen Description BLOOD RIGHT HAND  Final   Special  Requests BOTTLES DRAWN AEROBIC AND ANAEROBIC 10CC  Final   Culture   Final    NO GROWTH 2 DAYS Performed at [facility]    Report Status PENDING  Incomplete  Culture, blood (routine x 2)     Status: None (Preliminary result)   Collection Time: 10/16/14  4:25 PM  Result Value Ref Range Status   Specimen Description BLOOD RIGHT HAND  Final   Special Requests BOTTLES DRAWN AEROBIC AND ANAEROBIC 10CC  Final   Culture  Setup Time   Final    GRAM POSITIVE COCCI IN CLUSTERS AEROBIC BOTTLE ONLY CRITICAL RESULT CALLED TO, READ BACK BY AND VERIFIED WITH: N DUMAS,RN AT 1519 10/17/14 BY L BENFIELD    Culture   Final    STAPHYLOCOCCUS AUREUS Performed at [facility]    Report  Status PENDING  Incomplete     Studies: No results found.  Scheduled Meds: . amLODipine  5 mg Oral Daily  . carbamazepine  200 mg Oral TID  . feeding supplement (ENSURE ENLIVE)  237 mL Oral BID BM  . folic acid  1 mg Oral Daily  . levETIRAcetam  1,000 mg Oral BID  . levofloxacin (LEVAQUIN) IV  750 mg Intravenous Q24H  . metoprolol  25 mg Oral BID  . nicotine  21 mg Transdermal Daily  . pantoprazole  40 mg Oral Daily  . pneumococcal 23 valent vaccine  0.5 mL Intramuscular Tomorrow-1000  . pregabalin  75 mg Oral BID  . thiamine  100 mg Oral Daily  . vancomycin  1,000 mg Intravenous Q8H   Continuous Infusions:     Time spent: 25 minutes    Prathik Aman  [facility] Pager [phone]. If 7PM-7AM, please contact night-coverage at [email or web address], password [facility] 10/18/2014, 11:56 AM  LOS: 10 days

## 2014-10-18 NOTE — Consult Note (Signed)
[facility]     Reason for Consult: Staph aureus bacteremia    Referring Physician: CHAMP autoconsult  Active Problems:   Chronic liver disease   Chronic hepatitis C without hepatic coma   Essential hypertension, benign   Hyponatremia   S/P cerebral aneurysm repair   Seizures   Tobacco dependency   History of rib fracture   Encephalopathy, hepatic   Left elbow pain   Ulnar fracture   Altered mental status   Nonunion fracture of left ulna   Staphylococcus aureus bacteremia without sepsis   . amLODipine  5 mg Oral Daily  . carbamazepine  200 mg Oral TID  . feeding supplement (ENSURE ENLIVE)  237 mL Oral BID BM  . folic acid  1 mg Oral Daily  . levETIRAcetam  1,000 mg Oral BID  . levofloxacin (LEVAQUIN) IV  750 mg Intravenous Q24H  . metoprolol  25 mg Oral BID  . nicotine  21 mg Transdermal Daily  . pantoprazole  40 mg Oral Daily  . pneumococcal 23 valent vaccine  0.5 mL Intramuscular Tomorrow-1000  . pregabalin  75 mg Oral BID  . thiamine  100 mg Oral Daily  . vancomycin  1,000 mg Intravenous Q8H    Recommendations: Continue vancomycin, likely MRSA Will d/c levaquin   Assessment: He has 1/2 with Staph aureus bacteremia.  I will do TTE.    I suspect he would not be a good candidate for home IV therapy and will need an extended course, ? Placement. He is not hypoxic or having urinary symptoms so will stop levaquin Repeat blood cultures  Reviewed CXR personally  Antibiotics: Vancomycin day 2 Ceftriaxone 2 days levaquin first 6 days and restarted yesterday  HPI: [name] [name] is a 54 y.o. male known to me for evaluation of hepatitis C with cirrhosis who came in with multiple falls, encephalopathy.  He has a seizure disorder with difficult to control seizures.  Complains of pain.  No sob, no cough, no productive sputum.  No chills.  Last fever 7/28.  Not a hep C treatment candidate due to sz drug interactions.     Review of  Systems: A comprehensive review of systems was negative except for: Constitutional: positive for sweats  Past Medical History  Diagnosis Date  . Seizure   . ETOHism   . Aneurysm   . Hypertension   . Hepatitis C   . Hereditary and idiopathic peripheral neuropathy 06/03/2014    History  Substance Use Topics  . Smoking status: Former Smoker    Types: Cigarettes  . Smokeless tobacco: Never Used  . Alcohol Use: No     Comment: quit drinking 11/2011 per patient    Family History  Problem Relation Age of Onset  . Hypertension Mother   . Cancer Mother   . Hypertension Father   . Diabetes Sister   . Cancer Sister   . Cancer Sister    No Known Allergies  OBJECTIVE: Blood pressure 129/86, pulse 68, temperature 98 F (36.7 C), temperature source Oral, resp. rate 18, height 6\' 2"  (1.88 m), weight 197 lb 12 oz (89.7 kg), SpO2 98 %. General: awkae, alert, nad Skin: no rasehs Lungs: CTA B Cor: RRR without m Abdomen: soft, nt Ext: no edema Neuro: moves all extremities LAD no cervical or supraclavicular lad  Microbiology: Recent Results (from the past 240 hour(s))  Urine culture     Status: None   Collection Time: 10/08/14  6:03 PM  Result  Value Ref Range Status   Specimen Description URINE, CLEAN CATCH  Final   Special Requests NONE  Final   Culture   Final    MULTIPLE SPECIES PRESENT, SUGGEST RECOLLECTION Performed at [facility]    Report Status 10/10/2014 FINAL  Final  Urine culture     Status: None   Collection Time: 10/16/14 11:24 AM  Result Value Ref Range Status   Specimen Description URINE, RANDOM  Final   Special Requests NONE  Final   Culture   Final    NO GROWTH 1 DAY Performed at [facility]    Report Status 10/17/2014 FINAL  Final  Culture, blood (routine x 2)     Status: None (Preliminary result)   Collection Time: 10/16/14  4:15 PM  Result Value Ref Range Status   Specimen Description BLOOD RIGHT HAND  Final   Special Requests BOTTLES  DRAWN AEROBIC AND ANAEROBIC 10CC  Final   Culture   Final    NO GROWTH 2 DAYS Performed at [facility]    Report Status PENDING  Incomplete  Culture, blood (routine x 2)     Status: None (Preliminary result)   Collection Time: 10/16/14  4:25 PM  Result Value Ref Range Status   Specimen Description BLOOD RIGHT HAND  Final   Special Requests BOTTLES DRAWN AEROBIC AND ANAEROBIC 10CC  Final   Culture  Setup Time   Final    GRAM POSITIVE COCCI IN CLUSTERS AEROBIC BOTTLE ONLY CRITICAL RESULT CALLED TO, READ BACK BY AND VERIFIED WITH: N DUMAS,RN AT 1519 10/17/14 BY L BENFIELD    Culture   Final    STAPHYLOCOCCUS AUREUS Performed at [facility]    Report Status PENDING  Incomplete    Staci Righter, MD [facility] [email or web address] [phone] pager  [phone] cell 10/18/2014, 5:29 PM

## 2014-10-19 ENCOUNTER — Inpatient Hospital Stay (HOSPITAL_COMMUNITY): Payer: Medicaid Other

## 2014-10-19 DIAGNOSIS — R509 Fever, unspecified: Secondary | ICD-10-CM

## 2014-10-19 DIAGNOSIS — R4182 Altered mental status, unspecified: Secondary | ICD-10-CM

## 2014-10-19 DIAGNOSIS — B9561 Methicillin susceptible Staphylococcus aureus infection as the cause of diseases classified elsewhere: Secondary | ICD-10-CM

## 2014-10-19 DIAGNOSIS — S52202K Unspecified fracture of shaft of left ulna, subsequent encounter for closed fracture with nonunion: Secondary | ICD-10-CM

## 2014-10-19 LAB — CULTURE, BLOOD (ROUTINE X 2)

## 2014-10-19 LAB — BASIC METABOLIC PANEL
Anion gap: 9 (ref 5–15)
BUN: 15 mg/dL (ref 6–20)
CO2: 25 mmol/L (ref 22–32)
Calcium: 8.4 mg/dL — ABNORMAL LOW (ref 8.9–10.3)
Chloride: 94 mmol/L — ABNORMAL LOW (ref 101–111)
Creatinine, Ser: 0.78 mg/dL (ref 0.61–1.24)
GFR calc Af Amer: >60 mL/min (ref >60)
GFR calc non Af Amer: >60 mL/min (ref >60)
Glucose, Bld: 101 mg/dL — ABNORMAL HIGH (ref 65–99)
Potassium: 4.2 mmol/L (ref 3.5–5.1)
Sodium: 128 mmol/L — ABNORMAL LOW (ref 135–145)

## 2014-10-19 LAB — CARBAMAZEPINE LEVEL, TOTAL: Carbamazepine Lvl: 8.3 ug/mL (ref 4.0–12.0)

## 2014-10-19 LAB — VANCOMYCIN, TROUGH: Vancomycin Tr: 11 ug/mL (ref 10.0–20.0)

## 2014-10-19 MED ORDER — LEVETIRACETAM 500 MG PO TABS
1500.0000 mg | ORAL_TABLET | Freq: Two times a day (BID) | ORAL | Status: DC
  Administered 2014-10-19 – 2014-10-20 (×3): 1500 mg via ORAL
  Filled 2014-10-19 (×4): qty 3

## 2014-10-19 NOTE — Progress Notes (Signed)
[facility] PROGRESS NOTE  Tareek Sabo RUE:[id number] DOB: [birth year] DOA: 10/08/2014 PCP: [name], MD  brief narrative 54 year old male who lives alone, has history of alcoholism, seizures (secondary to aneurysm rupture), essential hypertension, tobacco dependency, hepatitis C who presented with acute metabolic encephalopathy secondary to tegretol toxicity. Admitted to stepdown. During the Hospital stay pt found to have non displaced proximal ulnar fracture. Hospital course prolonged due to hyponatremia and  fever on 7/28.   Assessment/ plan Acute metabolic encephalopathy secondary to tegretol toxicity/ hyponatremia/ hepatic encephaloapthy. Also developed fever on 7/28 with MRSA bacteremia.  Seizure disorder  on tegretol and  keppra as outpt. Patient presented with supratherapeutic Tegretol  which was held on admission and resumed after repeat levels were subtherapeutic.  Tegretol resume patient has persistent hyponatremia which coincides with the timing of starting him  back on his tegretol . Discussed with neurology today and Tegretol discontinued. Will increase Keppra to 1500 mg twice daily.  MRSA bacteremia 1/2blood cx +ve. Seen by ID and recommends TTE. Repeat blood cultures sent. Patient started on empiric vancomycin. Discontinue Levaquin. Will likely need a PICC line for prolonged IV therapy once repeat blood cultures are negative.  Hyponatremia  possibly  tegretol toxicity as Na drops despite fluid restriction after initiating tegretol back. . Low serum osm and  low normal urine osm. Monitor after holding Tegretol today.  Fever with possible acute bronchitis and UTI on 7/28 with 1/2 blood cx positive for staph aureus On vancomycin and Levaquin. Urine culture so far negative. Suspicion for infiltrate. Follow final blood culture results.   Chronic liver disease/Chronic hepatitis C /mild hepatic encephalopathy Ammonia level normal.  continue  Thiamine/folate   Essential hypertension, benign BP stable. Continue Norvasc   Left ulnar fracture and displaced Long-arm splint. Follow with orthopedics at discharge [name] and [facility]). Switched ultram to toradol given risk of lowering seizure threshold with ultram.  etoh and tobacco abuse counseled on cessation. On nicotine patch  DVT Prophylaxis Sq Lovenox   Family Communication:  None  at bedside. Will try to contact his son.   Code Status   Full Code   DISPO  appears that patient is not a good candidate to be discharged home. Patient's family friend and abducted found to [facility] informed the nurse that patient lives alone and has an unsafe environment. I will try to contact her today and discuss further. I will also try to get his son's phone number from her and discuss with him. Patient refusing to go to skilled nursing facility. Patient has capacity to make decisions for himself.  Consultants:  Neurology  Procedures: Left long-arm splint  Antibiotics: none  HPI/Subjective: Patient complaining off off-and-on back pain. Denies cough today.he is more oriented and nice to the nursing staff today.   Objective: Filed Vitals:   10/19/14 0616  BP: 158/77  Pulse: 58  Temp: 97.9 F (36.6 C)  Resp: 19    Intake/Output Summary (Last 24 hours) at 10/19/14 1205 Last data filed at 10/19/14 0733  Gross per 24 hour  Intake   1150 ml  Output    300 ml  Net    850 ml   Filed Weights   10/08/14 1645  Weight: 89.7 kg (197 lb 12 oz)    Exam:   General:  NAD, irritable  HEENT: no pallor, moist mucosa  Chest: clear b/l, no added sounds   CVS: NS1&S2  GI: soft, NT, ND, BS+  Musculoskeletal: warm, no edema, left forearm cast  CNS: AAOX3     Data Reviewed: Basic Metabolic Panel:  Recent Labs Lab 10/15/14 0610 10/16/14 0545 10/17/14 0444 10/18/14 0530 10/19/14 0519  NA 124* 125* 126* 131* 128*  K 4.6 4.4 4.2 4.1 4.2  CL 90*  92* 91* 95* 94*  CO2 26 24 26 26 25   GLUCOSE 113* 116* 118* 118* 101*  BUN 11 12 15 14 15   CREATININE 0.68 0.63 0.80 0.67 0.78  CALCIUM 8.7* 8.6* 8.7* 8.5* 8.4*   Liver Function Tests:  Recent Labs Lab 10/13/14 0545 10/15/14 0610  AST 19 18  ALT 14* 15*  ALKPHOS 65 74  BILITOT 0.6 0.7  PROT 6.6 8.0  ALBUMIN 3.3* 3.8   No results for input(s): LIPASE, AMYLASE in the last 168 hours. No results for input(s): AMMONIA in the last 168 hours. CBC:  Recent Labs Lab 10/13/14 0545 10/14/14 0541 10/15/14 0610  WBC 5.2 6.1 7.8  HGB 11.8* 12.8* 13.5  HCT 34.0* 36.3* 37.9*  MCV 97.7 97.6 96.7  PLT 155 170 153   Cardiac Enzymes: No results for input(s): CKTOTAL, CKMB, CKMBINDEX, TROPONINI in the last 168 hours. BNP (last 3 results)  Recent Labs  10/14/14 1825  BNP 64.4    ProBNP (last 3 results) No results for input(s): PROBNP in the last 8760 hours.  CBG: No results for input(s): GLUCAP in the last 168 hours.  Recent Results (from the past 240 hour(s))  Urine culture     Status: None   Collection Time: 10/16/14 11:24 AM  Result Value Ref Range Status   Specimen Description URINE, RANDOM  Final   Special Requests NONE  Final   Culture   Final    NO GROWTH 1 DAY Performed at [facility]    Report Status 10/17/2014 FINAL  Final  Culture, blood (routine x 2)     Status: None (Preliminary result)   Collection Time: 10/16/14  4:15 PM  Result Value Ref Range Status   Specimen Description BLOOD RIGHT HAND  Final   Special Requests BOTTLES DRAWN AEROBIC AND ANAEROBIC 10CC  Final   Culture   Final    NO GROWTH 2 DAYS Performed at [facility]    Report Status PENDING  Incomplete  Culture, blood (routine x 2)     Status: None   Collection Time: 10/16/14  4:25 PM  Result Value Ref Range Status   Specimen Description BLOOD RIGHT HAND  Final   Special Requests BOTTLES DRAWN AEROBIC AND ANAEROBIC 10CC  Final   Culture  Setup Time   Final    GRAM  POSITIVE COCCI IN CLUSTERS AEROBIC BOTTLE ONLY CRITICAL RESULT CALLED TO, READ BACK BY AND VERIFIED WITH: N DUMAS,RN AT 1519 10/17/14 BY L BENFIELD    Culture   Final    METHICILLIN RESISTANT STAPHYLOCOCCUS AUREUS Performed at [facility]    Report Status 10/19/2014 FINAL  Final   Organism ID, Bacteria METHICILLIN RESISTANT STAPHYLOCOCCUS AUREUS  Final      Susceptibility   Methicillin resistant staphylococcus aureus - MIC*    CIPROFLOXACIN >=8 RESISTANT Resistant     ERYTHROMYCIN >=8 RESISTANT Resistant     GENTAMICIN <=0.5 SENSITIVE Sensitive     OXACILLIN >=4 RESISTANT Resistant     TETRACYCLINE <=1 SENSITIVE Sensitive     VANCOMYCIN 1 SENSITIVE Sensitive     TRIMETH/SULFA <=10 SENSITIVE Sensitive     CLINDAMYCIN <=0.25 SENSITIVE Sensitive     RIFAMPIN <=0.5 SENSITIVE Sensitive     Inducible Clindamycin  NEGATIVE Sensitive     * METHICILLIN RESISTANT STAPHYLOCOCCUS AUREUS  Culture, blood (routine x 2)     Status: None (Preliminary result)   Collection Time: 10/18/14  6:30 PM  Result Value Ref Range Status   Specimen Description BLOOD LEFT HAND  Final   Special Requests   Final    BOTTLES DRAWN AEROBIC ONLY 5CC Performed at [facility]    Culture PENDING  Incomplete   Report Status PENDING  Incomplete  Culture, blood (routine x 2)     Status: None (Preliminary result)   Collection Time: 10/18/14  6:35 PM  Result Value Ref Range Status   Specimen Description BLOOD RIGHT ANTECUBITAL  Final   Special Requests   Final    BOTTLES DRAWN AEROBIC ONLY 6CC Performed at [facility]    Culture PENDING  Incomplete   Report Status PENDING  Incomplete     Studies: No results found.  Scheduled Meds: . amLODipine  5 mg Oral Daily  . feeding supplement (ENSURE ENLIVE)  237 mL Oral BID BM  . folic acid  1 mg Oral Daily  . levETIRAcetam  1,500 mg Oral BID  . metoprolol  25 mg Oral BID  . nicotine  21 mg Transdermal Daily  . pantoprazole  40 mg Oral Daily   . pneumococcal 23 valent vaccine  0.5 mL Intramuscular Tomorrow-1000  . pregabalin  75 mg Oral BID  . thiamine  100 mg Oral Daily  . vancomycin  1,000 mg Intravenous Q8H   Continuous Infusions:     Time spent: 25 minutes    Breon Diss  [facility] Pager [phone]. If 7PM-7AM, please contact night-coverage at [email or web address], password [facility] 10/19/2014, 12:05 PM  LOS: 11 days

## 2014-10-19 NOTE — Progress Notes (Signed)
ANTIBIOTIC CONSULT NOTE - FOLLOW UP  Pharmacy Consult for Vancomycin Indication: MRSA bacteremia  No Known Allergies  Patient Measurements: Height:  (188 cm) Weight: 197 lb 12 oz (89.7 kg) IBW/kg (Calculated) : 82.2  Vital Signs: Temp: 97.9 F (36.6 C) (07/31 0616) Temp Source: Oral (07/31 0616) BP: 158/77 mmHg (07/31 0616) Pulse Rate: 58 (07/31 0616) Intake/Output from previous day: 07/30 0701 - 07/31 0700 In: 1390 [P.O.:840; IV Piggyback:550] Out: 550 [Urine:550] Intake/Output from this shift:    Labs:  Recent Labs  10/17/14 0444 10/18/14 0530 10/19/14 0519  CREATININE 0.80 0.67 0.78   Estimated Creatinine Clearance: 121.3 mL/min (by C-G formula based on Cr of 0.78).  Recent Labs  10/19/14 0859  [facility] 11     Microbiology: Recent Results (from the past 720 hour(s))  MRSA PCR Screening     Status: None   Collection Time: 10/08/14  4:46 PM  Result Value Ref Range Status   MRSA by PCR NEGATIVE NEGATIVE Final    Comment:        The GeneXpert MRSA Assay (FDA approved for NASAL specimens only), is one component of a comprehensive MRSA colonization surveillance program. It is not intended to diagnose MRSA infection nor to guide or monitor treatment for MRSA infections.   Urine culture     Status: None   Collection Time: 10/08/14  6:03 PM  Result Value Ref Range Status   Specimen Description URINE, CLEAN CATCH  Final   Special Requests NONE  Final   Culture   Final    MULTIPLE SPECIES PRESENT, SUGGEST RECOLLECTION Performed at [facility]    Report Status 10/10/2014 FINAL  Final  Urine culture     Status: None   Collection Time: 10/16/14 11:24 AM  Result Value Ref Range Status   Specimen Description URINE, RANDOM  Final   Special Requests NONE  Final   Culture   Final    NO GROWTH 1 DAY Performed at [facility]    Report Status 10/17/2014 FINAL  Final  Culture, blood (routine x 2)     Status: None (Preliminary result)    Collection Time: 10/16/14  4:15 PM  Result Value Ref Range Status   Specimen Description BLOOD RIGHT HAND  Final   Special Requests BOTTLES DRAWN AEROBIC AND ANAEROBIC 10CC  Final   Culture   Final    NO GROWTH 2 DAYS Performed at [facility]    Report Status PENDING  Incomplete  Culture, blood (routine x 2)     Status: None   Collection Time: 10/16/14  4:25 PM  Result Value Ref Range Status   Specimen Description BLOOD RIGHT HAND  Final   Special Requests BOTTLES DRAWN AEROBIC AND ANAEROBIC 10CC  Final   Culture  Setup Time   Final    GRAM POSITIVE COCCI IN CLUSTERS AEROBIC BOTTLE ONLY CRITICAL RESULT CALLED TO, READ BACK BY AND VERIFIED WITH: N DUMAS,RN AT 1519 10/17/14 BY L BENFIELD    Culture   Final    METHICILLIN RESISTANT STAPHYLOCOCCUS AUREUS Performed at [facility]    Report Status 10/19/2014 FINAL  Final   Organism ID, Bacteria METHICILLIN RESISTANT STAPHYLOCOCCUS AUREUS  Final      Susceptibility   Methicillin resistant staphylococcus aureus - MIC*    CIPROFLOXACIN >=8 RESISTANT Resistant     ERYTHROMYCIN >=8 RESISTANT Resistant     GENTAMICIN <=0.5 SENSITIVE Sensitive     OXACILLIN >=4 RESISTANT Resistant     TETRACYCLINE <=  1 SENSITIVE Sensitive     VANCOMYCIN 1 SENSITIVE Sensitive     TRIMETH/SULFA <=10 SENSITIVE Sensitive     CLINDAMYCIN <=0.25 SENSITIVE Sensitive     RIFAMPIN <=0.5 SENSITIVE Sensitive     Inducible Clindamycin NEGATIVE Sensitive     * METHICILLIN RESISTANT STAPHYLOCOCCUS AUREUS  Culture, blood (routine x 2)     Status: None (Preliminary result)   Collection Time: 10/18/14  6:30 PM  Result Value Ref Range Status   Specimen Description BLOOD LEFT HAND  Final   Special Requests   Final    BOTTLES DRAWN AEROBIC ONLY 5CC Performed at [facility]    Culture PENDING  Incomplete   Report Status PENDING  Incomplete  Culture, blood (routine x 2)     Status: None (Preliminary result)   Collection Time: 10/18/14  6:35 PM   Result Value Ref Range Status   Specimen Description BLOOD RIGHT ANTECUBITAL  Final   Special Requests   Final    BOTTLES DRAWN AEROBIC ONLY 6CC Performed at [facility]    Culture PENDING  Incomplete   Report Status PENDING  Incomplete    Anti-infectives    Start     Dose/Rate Route Frequency Ordered Stop   10/18/14 1000  levofloxacin (LEVAQUIN) IVPB 750 mg  Status:  Discontinued     750 mg 100 mL/hr over 90 Minutes Intravenous Every 24 hours 10/17/14 1550 10/19/14 0945   10/18/14 0200  vancomycin (VANCOCIN) IVPB 1000 mg/200 mL premix     1,000 mg 200 mL/hr over 60 Minutes Intravenous Every 8 hours 10/17/14 1637     10/17/14 1645  vancomycin (VANCOCIN) IVPB 1000 mg/200 mL premix     1,000 mg 200 mL/hr over 60 Minutes Intravenous STAT 10/17/14 1637 10/17/14 1852   10/17/14 1630  vancomycin (VANCOCIN) 2,000 mg in sodium chloride 0.9 % 500 mL IVPB  Status:  Discontinued     2,000 mg 250 mL/hr over 120 Minutes Intravenous  Once 10/17/14 1551 10/17/14 1636   10/17/14 1000  azithromycin (ZITHROMAX) tablet 250 mg  Status:  Discontinued     250 mg Oral Daily 10/16/14 0823 10/17/14 1548   10/16/14 1630  cefTRIAXone (ROCEPHIN) 1 g in dextrose 5 % 50 mL IVPB - Premix  Status:  Discontinued     1 g 100 mL/hr over 30 Minutes Intravenous Every 24 hours 10/16/14 1549 10/17/14 1548   10/16/14 1000  azithromycin (ZITHROMAX) tablet 500 mg     500 mg Oral Daily 10/16/14 0823 10/16/14 1109   10/10/14 1800  levofloxacin (LEVAQUIN) tablet 750 mg  Status:  Discontinued     750 mg Oral Daily-1800 10/10/14 1022 10/15/14 0940   10/09/14 1800  levofloxacin (LEVAQUIN) IVPB 750 mg  Status:  Discontinued     750 mg 100 mL/hr over 90 Minutes Intravenous Every 24 hours 10/08/14 1638 10/10/14 1022   10/08/14 1600  levofloxacin (LEVAQUIN) IVPB 750 mg     750 mg 100 mL/hr over 90 Minutes Intravenous  Once 10/08/14 1547 10/08/14 1833      Assessment: 53 yoM with hx alcoholism, hepatitis C, 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 Started on azithromycin and ceftriaxone for acute bronchitis and UTI. Blood cultures (1 of 2) growing GPC and pharmacy consulted to start vancomycin for bacteremia and change azithromycin and ceftriaxone to levaquin.  Temp: afebrile since  7/29 WBC: wnl (last checked 7/27) Renal: SCr stable wnl; CrCl > 90 CG  7/28 >> Azithromycin >> 7/29 7/28 >> Ceftriazone  >> 7/29 7/29 >> Vancomycin >> 7/30 >> Levaquin >>    7/28 blood: 1 of 2 MRSA; TTE ordered 7/28 urine: NGF 7/30 repeat blood: ngtd  Dose changes/levels: 7/31 0900 VT = 11 on 1g q8; previous dose given 2 hr early   Goal of Therapy:  Vancomycin trough level 15-20 mcg/ml  Eradication of infection Appropriate antibiotic dosing for indication and renal function  Plan:  Day 4 antibiotics  Continue vancomycin as ordered; trough is low d/t early dose.  F/u TTE results.  Per ID, not a great candidate for home IV tx, but if to remain on vancomycin for now, would recheck trough before 1800 dose on 8/1  Follow clinical course, renal function, culture results as available  Follow for de-escalation of antibiotics and LOT   Bernadene Person, PharmD, BCPS Pager: [phone] 10/19/2014, 10:46 AM

## 2014-10-19 NOTE — Progress Notes (Signed)
Order received for Social Work consult. Pt has a family friend, that is his Advocate Reita Cliche. She state's that he is living in and unsafe environmentt, and would like to speak with some one. Will continue to mondor. [name] [name] 5:09 AM 10/19/2014

## 2014-10-19 NOTE — Progress Notes (Signed)
  Echocardiogram 2D Echocardiogram has been performed.  [name] [name] 10/19/2014, 1:24 PM

## 2014-10-19 NOTE — Progress Notes (Signed)
[facility]  Date of Admission:  10/08/2014  Antibiotics: vancomycin  Subjective: Complains of pain  Objective: Temp:  [97.9 F (36.6 C)-98 F (36.7 C)] 97.9 F (36.6 C) (07/31 0616) Pulse Rate:  [58-78] 58 (07/31 0616) Resp:  [18-19] 19 (07/31 0616) BP: (128-158)/(70-86) 158/77 mmHg (07/31 0616) SpO2:  [97 %-100 %] 97 % (07/31 0616)  General: awake, alert, nad Skin: no rashes Lungs: CTA B Cor: RRR Ext no edema  Lab Results Lab Results  Component Value Date   WBC 7.8 10/15/2014   HGB 13.5 10/15/2014   HCT 37.9* 10/15/2014   MCV 96.7 10/15/2014   PLT 153 10/15/2014    Lab Results  Component Value Date   CREATININE 0.78 10/19/2014   BUN 15 10/19/2014   NA 128* 10/19/2014   K 4.2 10/19/2014   CL 94* 10/19/2014   CO2 25 10/19/2014    Lab Results  Component Value Date   ALT 15* 10/15/2014   AST 18 10/15/2014   ALKPHOS 74 10/15/2014   BILITOT 0.7 10/15/2014      Microbiology: Recent Results (from the past 240 hour(s))  Urine culture     Status: None   Collection Time: 10/16/14 11:24 AM  Result Value Ref Range Status   Specimen Description URINE, RANDOM  Final   Special Requests NONE  Final   Culture   Final    NO GROWTH 1 DAY Performed at [facility]    Report Status 10/17/2014 FINAL  Final  Culture, blood (routine x 2)     Status: None (Preliminary result)   Collection Time: 10/16/14  4:15 PM  Result Value Ref Range Status   Specimen Description BLOOD RIGHT HAND  Final   Special Requests BOTTLES DRAWN AEROBIC AND ANAEROBIC 10CC  Final   Culture   Final    NO GROWTH 2 DAYS Performed at [facility]    Report Status PENDING  Incomplete  Culture, blood (routine x 2)     Status: None (Preliminary result)   Collection Time: 10/16/14  4:25 PM  Result Value Ref Range Status   Specimen Description BLOOD RIGHT HAND  Final   Special Requests BOTTLES DRAWN AEROBIC AND ANAEROBIC 10CC  Final   Culture  Setup  Time   Final    GRAM POSITIVE COCCI IN CLUSTERS AEROBIC BOTTLE ONLY CRITICAL RESULT CALLED TO, READ BACK BY AND VERIFIED WITH: N DUMAS,RN AT 1519 10/17/14 BY L BENFIELD    Culture   Final    STAPHYLOCOCCUS AUREUS Performed at [facility]    Report Status PENDING  Incomplete  Culture, blood (routine x 2)     Status: None (Preliminary result)   Collection Time: 10/18/14  6:30 PM  Result Value Ref Range Status   Specimen Description BLOOD LEFT HAND  Final   Special Requests   Final    BOTTLES DRAWN AEROBIC ONLY 5CC Performed at [facility]    Culture PENDING  Incomplete   Report Status PENDING  Incomplete  Culture, blood (routine x 2)     Status: None (Preliminary result)   Collection Time: 10/18/14  6:35 PM  Result Value Ref Range Status   Specimen Description BLOOD RIGHT ANTECUBITAL  Final   Special Requests   Final    BOTTLES DRAWN AEROBIC ONLY 6CC Performed at [facility]    Culture PENDING  Incomplete   Report Status PENDING  Incomplete    Studies/Results: No results found.  Assessment/Plan:  1) Staph bacteremia - 1/2, sensitivities pending.  TTE ordered.  I don't think he would be a good candidate for home IV therapy but will need a prolonged course, depending on echo.  Repeat blood cultures sent Dr. Drue Second tomorrow   Staci Righter, MD [facility] [email or web address] [phone] pager   [phone] cell 10/19/2014, 9:48 AM

## 2014-10-20 DIAGNOSIS — S52202D Unspecified fracture of shaft of left ulna, subsequent encounter for closed fracture with routine healing: Secondary | ICD-10-CM

## 2014-10-20 DIAGNOSIS — T421X1S Poisoning by iminostilbenes, accidental (unintentional), sequela: Secondary | ICD-10-CM

## 2014-10-20 LAB — CBC
HCT: 34.3 % — ABNORMAL LOW (ref 39.0–52.0)
Hemoglobin: 12.1 g/dL — ABNORMAL LOW (ref 13.0–17.0)
MCH: 34.6 pg — AB (ref 26.0–34.0)
MCHC: 35.3 g/dL (ref 30.0–36.0)
MCV: 98 fL (ref 78.0–100.0)
PLATELETS: 209 10*3/uL (ref 150–400)
RBC: 3.5 MIL/uL — AB (ref 4.22–5.81)
RDW: 11.9 % (ref 11.5–15.5)
WBC: 6.1 10*3/uL (ref 4.0–10.5)

## 2014-10-20 LAB — BASIC METABOLIC PANEL
ANION GAP: 7 (ref 5–15)
BUN: 11 mg/dL (ref 6–20)
CHLORIDE: 98 mmol/L — AB (ref 101–111)
CO2: 28 mmol/L (ref 22–32)
Calcium: 8.8 mg/dL — ABNORMAL LOW (ref 8.9–10.3)
Creatinine, Ser: 0.6 mg/dL — ABNORMAL LOW (ref 0.61–1.24)
GLUCOSE: 91 mg/dL (ref 65–99)
POTASSIUM: 3.9 mmol/L (ref 3.5–5.1)
Sodium: 133 mmol/L — ABNORMAL LOW (ref 135–145)

## 2014-10-20 LAB — CARBAMAZEPINE LEVEL, TOTAL: Carbamazepine Lvl: 3.7 ug/mL — ABNORMAL LOW (ref 4.0–12.0)

## 2014-10-20 NOTE — Progress Notes (Addendum)
[facility] PROGRESS NOTE  [name] [name] ZOX:[id number] DOB: [birth year] DOA: 10/08/2014 PCP: [name], MD  brief narrative 54 year old male who lives alone, has history of alcoholism, seizures (secondary to aneurysm rupture), essential hypertension, tobacco dependency, hepatitis C who presented with acute metabolic encephalopathy secondary to tegretol toxicity. Admitted to stepdown. During the Hospital stay pt found to have non displaced proximal ulnar fracture. Hospital course prolonged due to hyponatremia and  fever on 7/28.   Assessment/ plan Acute metabolic encephalopathy secondary to tegretol toxicity/ hyponatremia/ hepatic encephaloapthy. Also developed fever on 7/28 with MRSA bacteremia.  Seizure disorder  on tegretol and  keppra as outpt. Patient presented with supratherapeutic Tegretol  which was held on admission and resumed after repeat levels were subtherapeutic.  Tegretol resumed but patient has persistent hyponatremia which coincides with the timing of starting him  back on his tegretol .  Discussed with neurology gain on 7/31 and Tegretol discontinued.  increased Keppra to 1500 mg twice daily. i also have doubts about his compliance with tegretol use.  MRSA bacteremia 1/2blood cx +ve. Seen by ID. TTE without any vegetations.  Repeat blood cultures sent. Patient started on empiric vancomycin. Discontinued Levaquin. Will likely need a PICC line for prolonged IV therapy. Will follow with ID.  Hyponatremia  possibly due to tegretol as Na drops despite fluid restriction after initiating tegretol back. . Low serum osm and  low normal urine osm. Tegretol held yesterday and Na has improved this am.   Fever with possible acute bronchitis and UTI on 7/28 with 1/2 blood cx positive for staph aureus On vancomycin and Levaquin. Urine culture so far negative. Suspicion for infiltrate. Follow final blood culture results.   Chronic liver disease/Chronic hepatitis C /mild  hepatic encephalopathy Ammonia level normal.  continue Thiamine/folate   Essential hypertension, benign BP stable. Continue Norvasc   Left ulnar fracture and displaced Long-arm splint. Follow with orthopedics at discharge [name] [name] and [facility]). Switched ultram to toradol given risk of lowering seizure threshold with ultram.  etoh and tobacco abuse counseled on cessation. On nicotine patch  DVT Prophylaxis Sq Lovenox   Family Communication:  None  at bedside. Will try to contact his son.   Code Status   Full Code   DISPO  appears that patient is not a good candidate to be discharged home. Patient's family friend and abducted found to [facility] informed the nurse that patient lives alone and has an unsafe environment.i was unable to reach her on 7/31 and left a message. The pt refused to provide me his son's phone number.  Patient refusing to go to skilled nursing facility. Patient has capacity to make decisions for himself.  Consultants:  Neurology  Procedures: Left long-arm splint  Antibiotics: none  HPI/Subjective: Patient sleeping . Denies any symptoms today. Wants to go home.    Objective: Filed Vitals:   10/20/14 0557  BP: 117/54  Pulse: 66  Temp: 97.8 F (36.6 C)  Resp: 18    Intake/Output Summary (Last 24 hours) at 10/20/14 0848 Last data filed at 10/20/14 0126  Gross per 24 hour  Intake   1320 ml  Output   1150 ml  Net    170 ml   Filed Weights   10/08/14 1645  Weight: 89.7 kg (197 lb 12 oz)    Exam:   General:  NAD,   HEENT:moist mucosa  Chest: clear b/l, no added sounds   CVS: NS1&S2  GI: soft, NT, ND, BS+  Musculoskeletal: warm, no edema,  left forearm cast  CNS: Alert and oreinted     Data Reviewed: Basic Metabolic Panel:  Recent Labs Lab 10/16/14 0545 10/17/14 0444 10/18/14 0530 10/19/14 0519 10/20/14 0520  NA 125* 126* 131* 128* 133*  K 4.4 4.2 4.1 4.2 3.9  CL 92* 91* 95* 94* 98*  CO2 24 26 26 25  28   GLUCOSE 116* 118* 118* 101* 91  BUN 12 15 14 15 11   CREATININE 0.63 0.80 0.67 0.78 0.60*  CALCIUM 8.6* 8.7* 8.5* 8.4* 8.8*   Liver Function Tests:  Recent Labs Lab 10/15/14 0610  AST 18  ALT 15*  ALKPHOS 74  BILITOT 0.7  PROT 8.0  ALBUMIN 3.8   No results for input(s): LIPASE, AMYLASE in the last 168 hours. No results for input(s): AMMONIA in the last 168 hours. CBC:  Recent Labs Lab 10/14/14 0541 10/15/14 0610 10/20/14 0520  WBC 6.1 7.8 6.1  HGB 12.8* 13.5 12.1*  HCT 36.3* 37.9* 34.3*  MCV 97.6 96.7 98.0  PLT 170 153 209   Cardiac Enzymes: No results for input(s): CKTOTAL, CKMB, CKMBINDEX, TROPONINI in the last 168 hours. BNP (last 3 results)  Recent Labs  10/14/14 1825  BNP 64.4    ProBNP (last 3 results) No results for input(s): PROBNP in the last 8760 hours.  CBG: No results for input(s): GLUCAP in the last 168 hours.  Recent Results (from the past 240 hour(s))  Urine culture     Status: None   Collection Time: 10/16/14 11:24 AM  Result Value Ref Range Status   Specimen Description URINE, RANDOM  Final   Special Requests NONE  Final   Culture   Final    NO GROWTH 1 DAY Performed at [facility]    Report Status 10/17/2014 FINAL  Final  Culture, blood (routine x 2)     Status: None (Preliminary result)   Collection Time: 10/16/14  4:15 PM  Result Value Ref Range Status   Specimen Description BLOOD RIGHT HAND  Final   Special Requests BOTTLES DRAWN AEROBIC AND ANAEROBIC 10CC  Final   Culture   Final    NO GROWTH 3 DAYS Performed at [facility]    Report Status PENDING  Incomplete  Culture, blood (routine x 2)     Status: None   Collection Time: 10/16/14  4:25 PM  Result Value Ref Range Status   Specimen Description BLOOD RIGHT HAND  Final   Special Requests BOTTLES DRAWN AEROBIC AND ANAEROBIC 10CC  Final   Culture  Setup Time   Final    GRAM POSITIVE COCCI IN CLUSTERS AEROBIC BOTTLE ONLY CRITICAL RESULT CALLED TO, READ  BACK BY AND VERIFIED WITH: N DUMAS,RN AT 1519 10/17/14 BY L BENFIELD    Culture   Final    METHICILLIN RESISTANT STAPHYLOCOCCUS AUREUS Performed at [facility]    Report Status 10/19/2014 FINAL  Final   Organism ID, Bacteria METHICILLIN RESISTANT STAPHYLOCOCCUS AUREUS  Final      Susceptibility   Methicillin resistant staphylococcus aureus - MIC*    CIPROFLOXACIN >=8 RESISTANT Resistant     ERYTHROMYCIN >=8 RESISTANT Resistant     GENTAMICIN <=0.5 SENSITIVE Sensitive     OXACILLIN >=4 RESISTANT Resistant     TETRACYCLINE <=1 SENSITIVE Sensitive     VANCOMYCIN 1 SENSITIVE Sensitive     TRIMETH/SULFA <=10 SENSITIVE Sensitive     CLINDAMYCIN <=0.25 SENSITIVE Sensitive     RIFAMPIN <=0.5 SENSITIVE Sensitive     Inducible Clindamycin NEGATIVE Sensitive     *  METHICILLIN RESISTANT STAPHYLOCOCCUS AUREUS  Culture, blood (routine x 2)     Status: None (Preliminary result)   Collection Time: 10/18/14  6:30 PM  Result Value Ref Range Status   Specimen Description BLOOD LEFT HAND  Final   Special Requests BOTTLES DRAWN AEROBIC ONLY 5CC  Final   Culture   Final    NO GROWTH < 24 HOURS Performed at [facility]    Report Status PENDING  Incomplete  Culture, blood (routine x 2)     Status: None (Preliminary result)   Collection Time: 10/18/14  6:35 PM  Result Value Ref Range Status   Specimen Description BLOOD RIGHT ANTECUBITAL  Final   Special Requests BOTTLES DRAWN AEROBIC ONLY 6CC  Final   Culture   Final    NO GROWTH < 24 HOURS Performed at [facility]    Report Status PENDING  Incomplete     Studies: No results found.  Scheduled Meds: . amLODipine  5 mg Oral Daily  . feeding supplement (ENSURE ENLIVE)  237 mL Oral BID BM  . folic acid  1 mg Oral Daily  . levETIRAcetam  1,500 mg Oral BID  . metoprolol  25 mg Oral BID  . nicotine  21 mg Transdermal Daily  . pantoprazole  40 mg Oral Daily  . pneumococcal 23 valent vaccine  0.5 mL Intramuscular  Tomorrow-1000  . pregabalin  75 mg Oral BID  . thiamine  100 mg Oral Daily  . vancomycin  1,000 mg Intravenous Q8H   Continuous Infusions:     Time spent: 25 minutes    Lateya Dauria  [facility] Pager [phone]. If 7PM-7AM, please contact night-coverage at [email or web address], password [facility] 10/20/2014, 8:48 AM  LOS: 12 days

## 2014-10-20 NOTE — Progress Notes (Signed)
Date:  October 20, 2014 Signed out ama  Marcelle Smiling, RN, BSN, [place]   [phone]

## 2014-10-20 NOTE — Progress Notes (Signed)
Pt has taken left arm splint completely off despite nursing attempts to keep it on. Ortho tech notified to replace.

## 2014-10-20 NOTE — Progress Notes (Signed)
Pt unwilling to await MD discharge. Signed out AMA. Pt alert and oriented. Pt notified of risks for signing out AMA. IV d/c with catheter intact.  Pt escorted to ED entrance awaiting private ride.

## 2014-10-20 NOTE — Progress Notes (Signed)
PT Cancellation Note / PT TO SIGN OFF  Patient Details Name: [name] [name] MRN: [id number] DOB: Feb 20, 1961   Cancelled Treatment:    Reason Eval/Treat Not Completed: Other (comment)  Pt's 5th cancel today.  Pt states he can't ambulate, unable to provide reason.  Requests pain meds, RN notified.  Pt notified due to his cancellations PT will be signing off.   [name],[name] 10/20/2014, 11:32 AM Zenovia Jarred, PT, DPT 10/20/2014 Pager: [phone]

## 2014-10-20 NOTE — Progress Notes (Signed)
Subjective: Patient is much less encephalopathic than previously.   Does not want to stay any longer.   Tegretol was stopped over the weekend and keppra was increased.   Exam: Filed Vitals:   10/20/14 0557  BP: 117/54  Pulse: 66  Temp: 97.8 F (36.6 C)  Resp: 18   Gen: In bed, NAD Resp: non-labored breathing, no acute distress Abd: soft, nt  Neuro: MS: awake, alert CN: EOMI, no diploplia Motor: good sternght.  Gait: unsteady(pt states that he is at baseline. )  Pertinent Labs: BMP  Na 133  Impression: 54 yo M presented with encephalopathy in the setting of tegretol toxicity and multiple metabolic issues. His MS appears to have cleared, but restarting tegretol even at a lower dose resulted in a fairly rapid drop in Na. I do not typically modify therapy for mild hyponatremia with tegretol, but given that it may be difficult for him to be monitored, a change to keppra/lyrica therapy may be reasonable.   Recommendations: 1) Agree with increase of keppra to  BID in the setting of having stopped tegretol. 2) Continue lyrica, though if further seizures were to occur, then there is ample room for increasing this dose.  3) Please call with any further questions or commands.   Ritta Slot, MD [facility] [phone]  If 7pm- 7am, please page neurology on call as listed in AMION.

## 2014-10-20 NOTE — Progress Notes (Signed)
Clinical Social Work  CSW received inappropriate referral stating that patient is homeless. CSW previously completed full psychosocial assessment and patient lives in apartment with roommate. Patient refusing SNF/[name] placement. Son was involved with treatment as well and reports patient will return home at DC.  CSW is signing off but available if needed.  [place], [place] [phone]

## 2014-10-20 NOTE — Discharge Summary (Signed)
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 in the last 168 hours. CBC:  Recent Labs Lab 10/14/14 0541 10/15/14 0610 10/20/14 0520  WBC 6.1 7.8 6.1  HGB 12.8* 13.5 12.1*  HCT 36.3* 37.9* 34.3*  MCV 97.6 96.7 98.0  PLT 170 153 209   Cardiac Enzymes: No results for input(s): CKTOTAL, CKMB, CKMBINDEX, TROPONINI in the last 168 hours. BNP: BNP (last 3 results)  Recent Labs  10/14/14 1825  BNP 64.4    ProBNP (last 3 results) No results for input(s): PROBNP in the last 8760 hours.  CBG: No results for input(s): GLUCAP in the last 168 hours.     SignedEddie North  [facility] 10/20/2014, 2:06 PM

## 2014-10-20 NOTE — Progress Notes (Signed)
Pt signed out AMA. Dr. Gonzella Lex notified.

## 2014-10-21 LAB — CULTURE, BLOOD (ROUTINE X 2): Culture: NO GROWTH

## 2014-10-23 LAB — CULTURE, BLOOD (ROUTINE X 2)
Culture: NO GROWTH
Culture: NO GROWTH

## 2014-10-31 ENCOUNTER — Encounter (HOSPITAL_COMMUNITY): Payer: Self-pay | Admitting: *Deleted

## 2014-10-31 ENCOUNTER — Emergency Department (HOSPITAL_COMMUNITY): Payer: Medicaid Other

## 2014-10-31 ENCOUNTER — Inpatient Hospital Stay (HOSPITAL_COMMUNITY)
Admission: EM | Admit: 2014-10-31 | Discharge: 2014-11-06 | DRG: 644 | Disposition: A | Discharge status: Skilled Nursing Facility | Payer: Medicaid Other | Attending: Family Medicine | Admitting: Family Medicine

## 2014-10-31 DIAGNOSIS — G40909 Epilepsy, unspecified, not intractable, without status epilepticus: Secondary | ICD-10-CM | POA: Diagnosis present

## 2014-10-31 DIAGNOSIS — Z79899 Other long term (current) drug therapy: Secondary | ICD-10-CM

## 2014-10-31 DIAGNOSIS — R7881 Bacteremia: Secondary | ICD-10-CM | POA: Diagnosis not present

## 2014-10-31 DIAGNOSIS — B182 Chronic viral hepatitis C: Secondary | ICD-10-CM | POA: Diagnosis present

## 2014-10-31 DIAGNOSIS — E871 Hypo-osmolality and hyponatremia: Secondary | ICD-10-CM | POA: Diagnosis not present

## 2014-10-31 DIAGNOSIS — Z833 Family history of diabetes mellitus: Secondary | ICD-10-CM | POA: Diagnosis not present

## 2014-10-31 DIAGNOSIS — F0391 Unspecified dementia with behavioral disturbance: Secondary | ICD-10-CM | POA: Diagnosis present

## 2014-10-31 DIAGNOSIS — S52202A Unspecified fracture of shaft of left ulna, initial encounter for closed fracture: Secondary | ICD-10-CM | POA: Diagnosis present

## 2014-10-31 DIAGNOSIS — G9341 Metabolic encephalopathy: Secondary | ICD-10-CM | POA: Diagnosis not present

## 2014-10-31 DIAGNOSIS — E222 Syndrome of inappropriate secretion of antidiuretic hormone: Principal | ICD-10-CM | POA: Diagnosis present

## 2014-10-31 DIAGNOSIS — Z87891 Personal history of nicotine dependence: Secondary | ICD-10-CM | POA: Diagnosis not present

## 2014-10-31 DIAGNOSIS — W19XXXA Unspecified fall, initial encounter: Secondary | ICD-10-CM | POA: Diagnosis present

## 2014-10-31 DIAGNOSIS — R569 Unspecified convulsions: Secondary | ICD-10-CM

## 2014-10-31 DIAGNOSIS — F03918 Unspecified dementia, unspecified severity, with other behavioral disturbance: Secondary | ICD-10-CM | POA: Insufficient documentation

## 2014-10-31 DIAGNOSIS — F1011 Alcohol abuse, in remission: Secondary | ICD-10-CM | POA: Diagnosis present

## 2014-10-31 DIAGNOSIS — Z8249 Family history of ischemic heart disease and other diseases of the circulatory system: Secondary | ICD-10-CM | POA: Diagnosis not present

## 2014-10-31 DIAGNOSIS — S51019A Laceration without foreign body of unspecified elbow, initial encounter: Secondary | ICD-10-CM | POA: Insufficient documentation

## 2014-10-31 DIAGNOSIS — F22 Delusional disorders: Secondary | ICD-10-CM | POA: Diagnosis present

## 2014-10-31 DIAGNOSIS — I10 Essential (primary) hypertension: Secondary | ICD-10-CM | POA: Diagnosis present

## 2014-10-31 DIAGNOSIS — Z8614 Personal history of Methicillin resistant Staphylococcus aureus infection: Secondary | ICD-10-CM | POA: Diagnosis not present

## 2014-10-31 DIAGNOSIS — Z809 Family history of malignant neoplasm, unspecified: Secondary | ICD-10-CM

## 2014-10-31 DIAGNOSIS — R52 Pain, unspecified: Secondary | ICD-10-CM

## 2014-10-31 DIAGNOSIS — F1010 Alcohol abuse, uncomplicated: Secondary | ICD-10-CM

## 2014-10-31 DIAGNOSIS — G609 Hereditary and idiopathic neuropathy, unspecified: Secondary | ICD-10-CM | POA: Diagnosis present

## 2014-10-31 DIAGNOSIS — B9562 Methicillin resistant Staphylococcus aureus infection as the cause of diseases classified elsewhere: Secondary | ICD-10-CM | POA: Diagnosis present

## 2014-10-31 LAB — CBC WITH DIFFERENTIAL/PLATELET
BASOS ABS: 0 10*3/uL (ref 0.0–0.1)
BASOS PCT: 0 % (ref 0–1)
EOS ABS: 0 10*3/uL (ref 0.0–0.7)
Eosinophils Relative: 0 % (ref 0–5)
HCT: 34.3 % — ABNORMAL LOW (ref 39.0–52.0)
Hemoglobin: 12.5 g/dL — ABNORMAL LOW (ref 13.0–17.0)
Lymphocytes Relative: 42 % (ref 12–46)
Lymphs Abs: 2.2 10*3/uL (ref 0.7–4.0)
MCH: 34.2 pg — AB (ref 26.0–34.0)
MCHC: 36.4 g/dL — AB (ref 30.0–36.0)
MCV: 93.7 fL (ref 78.0–100.0)
Monocytes Absolute: 0.5 10*3/uL (ref 0.1–1.0)
Monocytes Relative: 9 % (ref 3–12)
Neutro Abs: 2.6 10*3/uL (ref 1.7–7.7)
Neutrophils Relative %: 49 % (ref 43–77)
Platelets: 238 10*3/uL (ref 150–400)
RBC: 3.66 MIL/uL — AB (ref 4.22–5.81)
RDW: 12.1 % (ref 11.5–15.5)
WBC: 5.3 10*3/uL (ref 4.0–10.5)

## 2014-10-31 LAB — RAPID URINE DRUG SCREEN, HOSP PERFORMED
Amphetamines: NOT DETECTED
BARBITURATES: NOT DETECTED
Benzodiazepines: NOT DETECTED
COCAINE: NOT DETECTED
Opiates: NOT DETECTED
Tetrahydrocannabinol: NOT DETECTED

## 2014-10-31 LAB — COMPREHENSIVE METABOLIC PANEL
ALBUMIN: 3.4 g/dL — AB (ref 3.5–5.0)
ALK PHOS: 88 U/L (ref 38–126)
ALT: 19 U/L (ref 17–63)
ANION GAP: 10 (ref 5–15)
AST: 25 U/L (ref 15–41)
BILIRUBIN TOTAL: 0.4 mg/dL (ref 0.3–1.2)
CHLORIDE: 88 mmol/L — AB (ref 101–111)
CO2: 24 mmol/L (ref 22–32)
CREATININE: 0.59 mg/dL — AB (ref 0.61–1.24)
Calcium: 8.3 mg/dL — ABNORMAL LOW (ref 8.9–10.3)
GFR calc Af Amer: >60 mL/min (ref >60)
GFR calc non Af Amer: >60 mL/min (ref >60)
Glucose, Bld: 83 mg/dL (ref 65–99)
Potassium: 3.8 mmol/L (ref 3.5–5.1)
Sodium: 122 mmol/L — ABNORMAL LOW (ref 135–145)
Total Protein: 7.1 g/dL (ref 6.5–8.1)

## 2014-10-31 LAB — URINALYSIS, ROUTINE W REFLEX MICROSCOPIC
Bilirubin Urine: NEGATIVE
GLUCOSE, UA: NEGATIVE mg/dL
Hgb urine dipstick: NEGATIVE
KETONES UR: NEGATIVE mg/dL
Leukocytes, UA: NEGATIVE
NITRITE: NEGATIVE
PH: 7 (ref 5.0–8.0)
Protein, ur: NEGATIVE mg/dL
SPECIFIC GRAVITY, URINE: 1.007 (ref 1.005–1.030)
UROBILINOGEN UA: 0.2 mg/dL (ref 0.0–1.0)

## 2014-10-31 LAB — ETHANOL: ALCOHOL ETHYL (B): 26 mg/dL — AB (ref <5)

## 2014-10-31 LAB — CARBAMAZEPINE LEVEL, TOTAL: Carbamazepine Lvl: 14.4 ug/mL — ABNORMAL HIGH (ref 4.0–12.0)

## 2014-10-31 MED ORDER — MORPHINE SULFATE 4 MG/ML IJ SOLN
4.0000 mg | Freq: Once | INTRAMUSCULAR | Status: AC
  Administered 2014-10-31: 4 mg via INTRAMUSCULAR
  Filled 2014-10-31: qty 1

## 2014-10-31 MED ORDER — THIAMINE HCL 100 MG/ML IJ SOLN: 100.0000 mg | Freq: Every day | INTRAMUSCULAR | Status: DC

## 2014-10-31 MED ORDER — LEVETIRACETAM 500 MG PO TABS: 1000.0000 mg | ORAL_TABLET | Freq: Two times a day (BID) | ORAL | Status: DC

## 2014-10-31 MED ORDER — IOHEXOL 300 MG/ML  SOLN
100.0000 mL | Freq: Once | INTRAMUSCULAR | Status: AC | PRN
  Administered 2014-10-31: 21:00:00 via INTRAVENOUS

## 2014-10-31 MED ORDER — CARBAMAZEPINE 200 MG PO TABS
200.0000 mg | ORAL_TABLET | Freq: Three times a day (TID) | ORAL | Status: DC
  Administered 2014-10-31 – 2014-11-04 (×11): 200 mg via ORAL
  Filled 2014-10-31 (×13): qty 1

## 2014-10-31 MED ORDER — HYDROMORPHONE HCL 1 MG/ML IJ SOLN
1.0000 mg | Freq: Once | INTRAMUSCULAR | Status: AC
  Administered 2014-10-31: 1 mg via INTRAVENOUS
  Filled 2014-10-31: qty 1

## 2014-10-31 MED ORDER — SODIUM CHLORIDE 0.9 % IV BOLUS (SEPSIS)
1000.0000 mL | Freq: Once | INTRAVENOUS | Status: AC
  Administered 2014-10-31: 1000 mL via INTRAVENOUS

## 2014-10-31 MED ORDER — METOPROLOL TARTRATE 25 MG PO TABS
25.0000 mg | ORAL_TABLET | Freq: Two times a day (BID) | ORAL | Status: DC
  Administered 2014-10-31 – 2014-11-05 (×10): 25 mg via ORAL
  Filled 2014-10-31 (×12): qty 1

## 2014-10-31 MED ORDER — PREGABALIN 50 MG PO CAPS
100.0000 mg | ORAL_CAPSULE | Freq: Two times a day (BID) | ORAL | Status: DC
  Administered 2014-10-31 – 2014-11-05 (×11): 100 mg via ORAL
  Filled 2014-10-31 (×11): qty 2

## 2014-10-31 MED ORDER — PREGABALIN 50 MG PO CAPS: 150.0000 mg | ORAL_CAPSULE | Freq: Three times a day (TID) | ORAL | Status: DC

## 2014-10-31 MED ORDER — LORAZEPAM 1 MG PO TABS
1.0000 mg | ORAL_TABLET | Freq: Four times a day (QID) | ORAL | Status: AC | PRN
  Administered 2014-10-31 – 2014-11-02 (×3): 1 mg via ORAL
  Filled 2014-10-31 (×3): qty 1

## 2014-10-31 MED ORDER — PREGABALIN 50 MG PO CAPS
50.0000 mg | ORAL_CAPSULE | Freq: Every day | ORAL | Status: DC
  Administered 2014-11-01 – 2014-11-06 (×6): 50 mg via ORAL
  Filled 2014-10-31 (×7): qty 1

## 2014-10-31 MED ORDER — LEVETIRACETAM 500 MG PO TABS
1500.0000 mg | ORAL_TABLET | Freq: Two times a day (BID) | ORAL | Status: DC
  Administered 2014-10-31 – 2014-11-06 (×12): 1500 mg via ORAL
  Filled 2014-10-31 (×4): qty 3
  Filled 2014-10-31: qty 2
  Filled 2014-10-31 (×7): qty 3

## 2014-10-31 MED ORDER — ADULT MULTIVITAMIN W/MINERALS CH
1.0000 | ORAL_TABLET | Freq: Every day | ORAL | Status: DC
  Administered 2014-10-31 – 2014-11-06 (×7): 1 via ORAL
  Filled 2014-10-31 (×7): qty 1

## 2014-10-31 MED ORDER — LORAZEPAM 2 MG/ML IJ SOLN
1.0000 mg | Freq: Four times a day (QID) | INTRAMUSCULAR | Status: AC | PRN
  Administered 2014-11-01 – 2014-11-03 (×5): 1 mg via INTRAVENOUS
  Filled 2014-10-31 (×5): qty 1

## 2014-10-31 MED ORDER — MORPHINE SULFATE 4 MG/ML IJ SOLN
4.0000 mg | Freq: Once | INTRAMUSCULAR | Status: AC
  Administered 2014-10-31: 4 mg via INTRAVENOUS
  Filled 2014-10-31: qty 1

## 2014-10-31 MED ORDER — SODIUM CHLORIDE 0.9 % IV SOLN
INTRAVENOUS | Status: DC
  Administered 2014-10-31: 23:00:00 via INTRAVENOUS

## 2014-10-31 MED ORDER — HEPARIN SODIUM (PORCINE) 5000 UNIT/ML IJ SOLN
5000.0000 [IU] | Freq: Three times a day (TID) | INTRAMUSCULAR | Status: DC
  Administered 2014-10-31 – 2014-11-06 (×18): 5000 [IU] via SUBCUTANEOUS
  Filled 2014-10-31 (×18): qty 1

## 2014-10-31 MED ORDER — SODIUM CHLORIDE 0.9 % IJ SOLN
3.0000 mL | Freq: Two times a day (BID) | INTRAMUSCULAR | Status: DC
  Administered 2014-11-01 – 2014-11-04 (×3): 3 mL via INTRAVENOUS

## 2014-10-31 MED ORDER — VITAMIN B-1 100 MG PO TABS
100.0000 mg | ORAL_TABLET | Freq: Every day | ORAL | Status: DC
  Administered 2014-10-31 – 2014-11-06 (×7): 100 mg via ORAL
  Filled 2014-10-31 (×7): qty 1

## 2014-10-31 MED ORDER — MORPHINE SULFATE 4 MG/ML IJ SOLN: 4.0000 mg | Freq: Once | INTRAMUSCULAR | Status: DC

## 2014-10-31 MED ORDER — FOLIC ACID 1 MG PO TABS
1.0000 mg | ORAL_TABLET | Freq: Every day | ORAL | Status: DC
  Administered 2014-10-31 – 2014-11-06 (×7): 1 mg via ORAL
  Filled 2014-10-31 (×7): qty 1

## 2014-10-31 NOTE — ED Notes (Signed)
Per EMS - patient comes from home where he lives alone with c/o left elbow pain.  Patient had an injury to left elbow (fx) and ribs "weeks ago" related to fall.  Patient states he has continued to have issues with left elbow pain.  Patient also has hx of seizures and believes he may have had one this morning.  Patient states he noticed there were overturned chairs in his apartment and he doesn't remember how they got there.  Patient is alert and oriented to events, place and person, but not day of week.  Patient verbally aggressive with EMS and does not like responding to questions.  Patient's vitals on scene 150/90, HR 90, RR 18, CBG 77.

## 2014-10-31 NOTE — ED Provider Notes (Signed)
CSN: [id number]     Arrival date & time 10/31/14  1804 History   First MD Initiated Contact with Patient 10/31/14 1819     Chief Complaint  Patient presents with  . Elbow Pain    left     (Consider location/radiation/quality/duration/timing/severity/associated sxs/prior Treatment) The history is provided by the patient.  [name] [name] is a 54 y.o. male hx of seizure, ETOH, HTN here with fall, possible seizure. Patient states that he had a seizure this morning and fell and hit the left elbow. Patient called EMS for left elbow pain. Also has right-sided rib pain as well. Patient states that he still drinks alcohol. He states that he is taking his Tegretol but may have missed some doses. Of note, patient signed out AGAINST MEDICAL ADVICE 10 days ago and was not given a prescription of Keppra or Tegretol. Patient currently lives at home.   Past Medical History  Diagnosis Date  . Seizure   . ETOHism   . Aneurysm   . Hypertension   . Hepatitis C   . Hereditary and idiopathic peripheral neuropathy 06/03/2014   Past Surgical History  Procedure Laterality Date  . Cerebral aneurysm repair      At [facility]  . I&d extremity Left 10/26/2012    Procedure: IRRIGATION AND DEBRIDEMENT Left Elbow;  Surgeon: Sheral Apley, MD;  Location: [facility] OR;  Service: Orthopedics;  Laterality: Left;  . I&d extremity Left 10/29/2012    Procedure: IRRIGATION AND DEBRIDEMENT EXTREMITY, wound vac change, stimulan beads;  Surgeon: Sheral Apley, MD;  Location: [facility] OR;  Service: Orthopedics;  Laterality: Left;  lateral on bean bag  . Hardware removal Left 10/29/2012    Procedure: HARDWARE REMOVAL;  Surgeon: Sheral Apley, MD;  Location: [facility] OR;  Service: Orthopedics;  Laterality: Left;  . Incision and drainage abscess Left 11/02/2012    Procedure: INCISION AND DRAINAGE ABSCESS;  Surgeon: Sheral Apley, MD;  Location: [facility] OR;  Service: Orthopedics;  Laterality: Left;  . I&d extremity Left 11/02/2012     Procedure: IRRIGATION AND DEBRIDEMENT EXTREMITY;  Surgeon: Sheral Apley, MD;  Location: [facility] OR;  Service: Orthopedics;  Laterality: Left;   Family History  Problem Relation Age of Onset  . Hypertension Mother   . Cancer Mother   . Hypertension Father   . Diabetes Sister   . Cancer Sister   . Cancer Sister    Social History  Substance Use Topics  . Smoking status: Former Smoker    Types: Cigarettes  . Smokeless tobacco: Never Used  . Alcohol Use: No     Comment: quit drinking 11/2011 per patient    Review of Systems  Musculoskeletal:       R rib pain, L elbow pain   All other systems reviewed and are negative.     Allergies  Review of patient's allergies indicates no known allergies.  Home Medications   Prior to Admission medications   Medication Sig Start Date End Date Taking? Authorizing Provider  carbamazepine (TEGRETOL) 200 MG tablet Take 2 tablets (400 mg total) by mouth 3 (three) times daily. 09/03/14  Yes York Spaniel, MD  folic acid (FOLVITE) 1 MG tablet Take 1 tablet (1 mg total) by mouth daily. 08/15/14  Yes [name], MD  levETIRAcetam (KEPPRA) 1000 MG tablet Take 1 tablet (1,000 mg total) by mouth 2 (two) times daily. 09/03/14  Yes York Spaniel, MD  metoprolol (LOPRESSOR) 50 MG tablet Take 0.5 tablets (25 mg total)  by mouth 2 (two) times daily. 09/10/14  Yes [name], MD  pregabalin (LYRICA) 50 MG capsule Take 2 tablets in the morning, 1 tablet at noon and 2 tablets at bedtime. Patient taking differently: Take 150-300 mg by mouth 3 (three) times daily. Take 2 tablets in the morning, 1 tablet at noon and 2 tablets at bedtime. 09/03/14  Yes York Spaniel, MD  pregabalin (LYRICA) 50 MG capsule Take 2 tablets in the morning, 1 tablet at noon and 2 tablets at bedtime. Patient not taking: Reported on 10/08/2014 09/03/14   York Spaniel, MD  thiamine 100 MG tablet Take 1 tablet (100 mg total) by mouth daily. Patient not taking: Reported on 10/08/2014  03/25/14   [name], MD   BP 140/85 mmHg  Pulse 96  Temp(Src) 98.4 F (36.9 C) (Oral)  Resp 18  SpO2 95% Physical Exam  Constitutional:  Chronically ill, disheveled   HENT:  Head: Normocephalic.  Mouth/Throat: Oropharynx is clear and moist.  No obvious scalp hematoma   Eyes: Conjunctivae are normal. Pupils are equal, round, and reactive to light.  Neck: Normal range of motion. Neck supple.  Cardiovascular: Normal rate, regular rhythm and normal heart sounds.   Pulmonary/Chest: Effort normal.  Tenderness L lower ribs   Abdominal: Soft. Bowel sounds are normal. He exhibits no distension. There is no tenderness. There is no rebound.  Musculoskeletal:  L elbow with healing wound, + elbow effusion.   Neurological: He is alert.  Skin: Skin is warm and dry.  Psychiatric: He has a normal mood and affect. His behavior is normal. Judgment and thought content normal.  Nursing note and vitals reviewed.   ED Course  Procedures (including critical care time)  CRITICAL CARE Performed by: Silverio Lay, [place]   Total critical care time: 30 min   Critical care time was exclusive of separately billable procedures and treating other patients.  Critical care was necessary to treat or prevent imminent or life-threatening deterioration.  Critical care was time spent personally by me on the following activities: development of treatment plan with patient and/or surrogate as well as nursing, discussions with consultants, evaluation of patient's response to treatment, examination of patient, obtaining history from patient or surrogate, ordering and performing treatments and interventions, ordering and review of laboratory studies, ordering and review of radiographic studies, pulse oximetry and re-evaluation of patient's condition.   Labs Review Labs Reviewed  CBC WITH DIFFERENTIAL/PLATELET - Abnormal; Notable for the following:    RBC 3.66 (*)    Hemoglobin 12.5 (*)    HCT 34.3 (*)    MCH 34.2 (*)     MCHC 36.4 (*)    All other components within normal limits  COMPREHENSIVE METABOLIC PANEL - Abnormal; Notable for the following:    Sodium 122 (*)    Chloride 88 (*)    BUN <5 (*)    Creatinine, Ser 0.59 (*)    Calcium 8.3 (*)    Albumin 3.4 (*)    All other components within normal limits  ETHANOL - Abnormal; Notable for the following:    Alcohol, Ethyl (B) 26 (*)    All other components within normal limits  URINE RAPID DRUG SCREEN, HOSP PERFORMED  URINALYSIS, ROUTINE W REFLEX MICROSCOPIC (NOT AT [facility])  CARBAMAZEPINE LEVEL, TOTAL    Imaging Review Dg Ribs Unilateral W/chest Right  10/31/2014   CLINICAL DATA:  Right-sided rib pain after fall at home.  EXAM: RIGHT RIBS AND CHEST - 3+ VIEW  COMPARISON:  October 16, 2014.  FINDINGS: Mildly displaced fractures are seen involving the posterior portions of the right tenth, eleventh and twelfth ribs. No pneumothorax or pleural effusion is noted.  IMPRESSION: Mildly displaced right tenth, eleventh and twelfth rib fractures. No acute cardiopulmonary abnormality is noted.   Electronically Signed   By: Lupita Raider, M.D.   On: 10/31/2014 19:48   Dg Pelvis 1-2 Views  10/31/2014   CLINICAL DATA:  Fall at home.  EXAM: PELVIS - 1-2 VIEW  COMPARISON:  None.  FINDINGS: There is no evidence of pelvic fracture or diastasis. No pelvic bone lesions are seen. Sacroiliac and hip joints appear normal.  IMPRESSION: Normal pelvis.   Electronically Signed   By: Lupita Raider, M.D.   On: 10/31/2014 19:53   Dg Elbow Complete Left  10/31/2014   CLINICAL DATA:  Acute left elbow pain after fall at home.  EXAM: LEFT ELBOW - COMPLETE 3+ VIEW  COMPARISON:  October 13, 2014.  FINDINGS: There is continued presence of nondisplaced fracture involving the proximal ulna with intra-articular extension. No abnormal fat pad displacement is noted. Visualized portions of radius and ulna appear normal. Mild degenerative changes are noted.  IMPRESSION: Continued presence of proximal  ulnar fracture with intra-articular extension.   Electronically Signed   By: Lupita Raider, M.D.   On: 10/31/2014 19:51   Ct Head Wo Contrast  10/31/2014   CLINICAL DATA:  Unwitnessed fall  EXAM: CT HEAD WITHOUT CONTRAST  CT CERVICAL SPINE WITHOUT CONTRAST  TECHNIQUE: Multidetector CT imaging of the head and cervical spine was performed following the standard protocol without intravenous contrast. Multiplanar CT image reconstructions of the cervical spine were also generated.  COMPARISON:  10/08/2014, 08/22/14  FINDINGS: CT HEAD FINDINGS  Bony calvarium is intact. Diffuse atrophic changes are seen. There are findings consistent with encephalomalacia in the frontal areas bilaterally. This is stable from the prior exam. No acute hemorrhage or acute infarct is identified. No space-occupying mass lesion is noted.  CT CERVICAL SPINE FINDINGS  Seven cervical segments are well visualized. Vertebral body height is well maintained. Osteophytic changes are noted anteriorly at C4-5, C5-6 and C6-7. Mild facet hypertrophic changes are noted. No acute fracture or acute facet abnormality is seen. No gross soft tissue abnormality is seen. Carotid calcifications are noted.  IMPRESSION: CT of the head: Chronic ischemic/traumatic changes without acute abnormality.  CT of the cervical spine: Degenerative change without acute abnormality.   Electronically Signed   By: Alcide Clever M.D.   On: 10/31/2014 21:07   Ct Chest W Contrast  10/31/2014   CLINICAL DATA:  Seizure, elbow pain, probable fall  EXAM: CT CHEST, ABDOMEN, AND PELVIS WITH CONTRAST  TECHNIQUE: Multidetector CT imaging of the chest, abdomen and pelvis was performed following the standard protocol during bolus administration of intravenous contrast.  CONTRAST:  1 OMNIPAQUE IOHEXOL 300 MG/ML  SOLN  COMPARISON:  10/11/2014  FINDINGS: CT CHEST FINDINGS  Sagittal images of the spine shows no acute fractures. Sagittal view of the sternum is unremarkable. No scapular fracture.  No definite clavicle fracture.  Atherosclerotic calcifications of thoracic aorta. Heart size within normal limits. No pericardial effusion.  At least 3 or 4 healing fractures are noted in right posterior lower ribs. Healing fracture in left posterior rib noted in axial image 56. Three there is nondisplaced acute fracture of the right 12 rib. See axial image 65. Some pleural thickening noted in right posterior hemithorax.  There is no mediastinal hematoma or adenopathy.  No pericardial effusion.  No lung contusion or pneumothorax. Mild atelectasis noted in right lower lobe posteriorly. There is no pneumothorax.  CT ABDOMEN AND PELVIS FINDINGS  Sagittal images of the lumbar spine shows significant disc space flattening with vacuum disc phenomenon at L4-L5 and L5-S1 level. Large anterior osteophytes and moderate disc space flattening at L3-L4 level. Atherosclerotic calcifications of abdominal aorta and iliac arteries. Enhanced liver is unremarkable. No calcified gallstones are noted within gallbladder. Enhanced pancreas, spleen and adrenal glands are unremarkable. Degenerative changes bilateral SI joints. No sacral fracture is noted.  There is no evidence of urinary bladder injury. No pelvic fractures are noted. No inguinal adenopathy. Enhanced kidneys are symmetrical in size. No hydronephrosis or hydroureter. Delayed renal images shows bilateral renal symmetrical excretion.  No pericecal inflammation. The terminal ileum is unremarkable. Normal appendix.  Coronal images shows bilateral hip joint symmetrical in appearance. No hip fractures.  IMPRESSION: 1. There are bilateral posterior healing fractures as described above. Question tiny nondisplaced acute fracture of the right twelfth rib. 2. No lung contusion or pneumothorax. 3. No mediastinal hematoma or adenopathy. 4. Degenerative changes bilateral SI joints. Degenerative changes lumbar spine. 5. No acute visceral injury within abdomen or pelvis. 6. No pericecal  inflammation.  Normal appendix. 7. No evidence of urinary bladder injury. 8. No acute fractures are noted within abdomen and pelvis. Degenerative changes lumbar spine. 9.   Electronically Signed   By: Natasha Mead M.D.   On: 10/31/2014 21:10   Ct Cervical Spine Wo Contrast  10/31/2014   CLINICAL DATA:  Unwitnessed fall  EXAM: CT HEAD WITHOUT CONTRAST  CT CERVICAL SPINE WITHOUT CONTRAST  TECHNIQUE: Multidetector CT imaging of the head and cervical spine was performed following the standard protocol without intravenous contrast. Multiplanar CT image reconstructions of the cervical spine were also generated.  COMPARISON:  10/08/2014, 08/22/14  FINDINGS: CT HEAD FINDINGS  Bony calvarium is intact. Diffuse atrophic changes are seen. There are findings consistent with encephalomalacia in the frontal areas bilaterally. This is stable from the prior exam. No acute hemorrhage or acute infarct is identified. No space-occupying mass lesion is noted.  CT CERVICAL SPINE FINDINGS  Seven cervical segments are well visualized. Vertebral body height is well maintained. Osteophytic changes are noted anteriorly at C4-5, C5-6 and C6-7. Mild facet hypertrophic changes are noted. No acute fracture or acute facet abnormality is seen. No gross soft tissue abnormality is seen. Carotid calcifications are noted.  IMPRESSION: CT of the head: Chronic ischemic/traumatic changes without acute abnormality.  CT of the cervical spine: Degenerative change without acute abnormality.   Electronically Signed   By: Alcide Clever M.D.   On: 10/31/2014 21:07   Ct Abdomen Pelvis W Contrast  10/31/2014   CLINICAL DATA:  Seizure, elbow pain, probable fall  EXAM: CT CHEST, ABDOMEN, AND PELVIS WITH CONTRAST  TECHNIQUE: Multidetector CT imaging of the chest, abdomen and pelvis was performed following the standard protocol during bolus administration of intravenous contrast.  CONTRAST:  1 OMNIPAQUE IOHEXOL 300 MG/ML  SOLN  COMPARISON:  10/11/2014  FINDINGS: CT  CHEST FINDINGS  Sagittal images of the spine shows no acute fractures. Sagittal view of the sternum is unremarkable. No scapular fracture. No definite clavicle fracture.  Atherosclerotic calcifications of thoracic aorta. Heart size within normal limits. No pericardial effusion.  At least 3 or 4 healing fractures are noted in right posterior lower ribs. Healing fracture in left posterior rib noted in axial image 56. Three there is nondisplaced acute fracture of  the right 12 rib. See axial image 65. Some pleural thickening noted in right posterior hemithorax.  There is no mediastinal hematoma or adenopathy. No pericardial effusion.  No lung contusion or pneumothorax. Mild atelectasis noted in right lower lobe posteriorly. There is no pneumothorax.  CT ABDOMEN AND PELVIS FINDINGS  Sagittal images of the lumbar spine shows significant disc space flattening with vacuum disc phenomenon at L4-L5 and L5-S1 level. Large anterior osteophytes and moderate disc space flattening at L3-L4 level. Atherosclerotic calcifications of abdominal aorta and iliac arteries. Enhanced liver is unremarkable. No calcified gallstones are noted within gallbladder. Enhanced pancreas, spleen and adrenal glands are unremarkable. Degenerative changes bilateral SI joints. No sacral fracture is noted.  There is no evidence of urinary bladder injury. No pelvic fractures are noted. No inguinal adenopathy. Enhanced kidneys are symmetrical in size. No hydronephrosis or hydroureter. Delayed renal images shows bilateral renal symmetrical excretion.  No pericecal inflammation. The terminal ileum is unremarkable. Normal appendix.  Coronal images shows bilateral hip joint symmetrical in appearance. No hip fractures.  IMPRESSION: 1. There are bilateral posterior healing fractures as described above. Question tiny nondisplaced acute fracture of the right twelfth rib. 2. No lung contusion or pneumothorax. 3. No mediastinal hematoma or adenopathy. 4. Degenerative  changes bilateral SI joints. Degenerative changes lumbar spine. 5. No acute visceral injury within abdomen or pelvis. 6. No pericecal inflammation.  Normal appendix. 7. No evidence of urinary bladder injury. 8. No acute fractures are noted within abdomen and pelvis. Degenerative changes lumbar spine. 9.   Electronically Signed   By: Natasha Mead M.D.   On: 10/31/2014 21:10   I, Jencarlos Nicolson, personally reviewed and evaluated these images and lab results as part of my medical decision-making.   EKG Interpretation None      MDM   Final diagnoses:  None   [name] [name] is a 54 y.o. male here with fall, seizure. Seizure likely from medication uncompliance vs alcohol use. Will get labs, ETOH, xrays.   8:30 PM Na 122, likely causing seizures. Given 1 L NS. Xray showed displaced R 10th, 11th, 12th ribs, old injuries to L elbow. Will do trauma scan.   9:40 PM CT showed old fractures. 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 Tegretol level pending. Hospitalist to admit to tele for hyponatremia, seizure.       Richardean Canal, MD 10/31/14 2141

## 2014-10-31 NOTE — ED Notes (Signed)
Patient comes from home with c/o left elbow and rib pain s/p to a fall several weeks ago.  Patient's left elbow has minimal swelling.  No bruising noted to left ribs.  Patient states he may have had a seizure earlier today, but states he faithfully takes his Tegretol three times a day.

## 2014-10-31 NOTE — H&P (Signed)
[facility] History and Physical  Kennard Fildes WUJ:[id number] DOB: 12-Sep-1960 DOA: 10/31/2014  Referring physician: EDP PCP: [name], MD   Chief Complaint: Possible seizure   HPI: [name] [name] is a 54 y.o. male with h/o EtOH abuse, seizures.  Patient presents to ED after a fall and possible seizure this morning (he cant remember but his left elbow hurts and there are some overturned chairs in his apartment that he dosent remember how they got that way).  Patient was recently admitted to our service before leaving AMA on 8/1.  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  Plan had been made to wean patient off of tegretol and put him on 1.5gm BID keppra, but patient left AMA.  Since leaving AMA he has been taking his tegretol 400mg  TID at home every day.  His sodium is now down to 122 today on presentation.  Review of Systems: Systems reviewed.  As above, otherwise negative  Past Medical History  Diagnosis Date  . Seizure   . ETOHism   . Aneurysm   . Hypertension   . Hepatitis C   . Hereditary and idiopathic peripheral neuropathy 06/03/2014   Past Surgical History  Procedure Laterality Date  . Cerebral aneurysm repair      At [facility]  . I&d extremity Left 10/26/2012    Procedure: IRRIGATION AND DEBRIDEMENT Left Elbow;  Surgeon: Sheral Apley, MD;  Location: [facility] OR;  Service: Orthopedics;  Laterality: Left;  . I&d extremity Left 10/29/2012    Procedure: IRRIGATION AND DEBRIDEMENT EXTREMITY, wound vac change, stimulan beads;  Surgeon: Sheral Apley, MD;  Location: [facility] OR;  Service: Orthopedics;  Laterality: Left;  lateral on bean bag  . Hardware removal Left 10/29/2012    Procedure: HARDWARE REMOVAL;  Surgeon: Sheral Apley, MD;  Location: [facility] OR;  Service: Orthopedics;  Laterality: Left;  . Incision and drainage abscess Left 11/02/2012    Procedure: INCISION AND DRAINAGE ABSCESS;  Surgeon:  Sheral Apley, MD;  Location: [facility] OR;  Service: Orthopedics;  Laterality: Left;  . I&d extremity Left 11/02/2012    Procedure: IRRIGATION AND DEBRIDEMENT EXTREMITY;  Surgeon: Sheral Apley, MD;  Location: [facility] OR;  Service: Orthopedics;  Laterality: Left;   Social History:  reports that he has quit smoking. His smoking use included Cigarettes. He has never used smokeless tobacco. He reports that he does not drink alcohol or use illicit drugs.  No Known Allergies  Family History  Problem Relation Age of Onset  . Hypertension Mother   . Cancer Mother   . Hypertension Father   . Diabetes Sister   . Cancer Sister   . Cancer Sister      Prior to Admission medications   Medication Sig Start Date End Date Taking? Authorizing Provider  carbamazepine (TEGRETOL) 200 MG tablet Take 2 tablets (400 mg total) by mouth 3 (three) times daily. 09/03/14  Yes York Spaniel, MD  folic acid (FOLVITE) 1 MG tablet Take 1 tablet (1 mg total) by mouth daily. 08/15/14  Yes [name], MD  levETIRAcetam (KEPPRA) 1000 MG tablet Take 1 tablet (1,000 mg total) by mouth 2 (two) times daily. 09/03/14  Yes York Spaniel, MD  metoprolol (LOPRESSOR) 50 MG tablet Take 0.5 tablets (25 mg total) by mouth 2 (two) times daily. 09/10/14  Yes [name], MD  pregabalin (LYRICA) 50 MG capsule Take 2 tablets in the morning, 1 tablet at  noon and 2 tablets at bedtime. Patient taking differently: Take 150-300 mg by mouth 3 (three) times daily. Take 2 tablets in the morning, 1 tablet at noon and 2 tablets at bedtime. 09/03/14  Yes York Spaniel, MD   Physical Exam: Filed Vitals:   10/31/14 1827  BP: 140/85  Pulse: 96  Temp: 98.4 F (36.9 C)  Resp: 18    BP 140/85 mmHg  Pulse 96  Temp(Src) 98.4 F (36.9 C) (Oral)  Resp 18  SpO2 95%  General Appearance:    Alert, oriented, no distress, appears stated age  Head:    Normocephalic, atraumatic  Eyes:    PERRL, EOMI, sclera non-icteric        Nose:   Nares  without drainage or epistaxis. Mucosa, turbinates normal  Throat:   Moist mucous membranes. Oropharynx without erythema or exudate.  Neck:   Supple. No carotid bruits.  No thyromegaly.  No lymphadenopathy.   Back:     No CVA tenderness, no spinal tenderness  Lungs:     Clear to auscultation bilaterally, without wheezes, rhonchi or rales  Chest wall:    No tenderness to palpitation  Heart:    Regular rate and rhythm without murmurs, gallops, rubs  Abdomen:     Soft, non-tender, nondistended, normal bowel sounds, no organomegaly  Genitalia:    deferred  Rectal:    deferred  Extremities:   No clubbing, cyanosis or edema.  Pulses:   2+ and symmetric all extremities  Skin:   Skin color, texture, turgor normal, no rashes or lesions  Lymph nodes:   Cervical, supraclavicular, and axillary nodes normal  Neurologic:   CNII-XII intact. Normal strength, sensation and reflexes      throughout    Labs on Admission:  Basic Metabolic Panel:  Recent Labs Lab 10/31/14 1840  NA 122*  K 3.8  CL 88*  CO2 24  GLUCOSE 83  BUN <5*  CREATININE 0.59*  CALCIUM 8.3*   Liver Function Tests:  Recent Labs Lab 10/31/14 1840  AST 25  ALT 19  ALKPHOS 88  BILITOT 0.4  PROT 7.1  ALBUMIN 3.4*   No results for input(s): LIPASE, AMYLASE in the last 168 hours. No results for input(s): AMMONIA in the last 168 hours. CBC:  Recent Labs Lab 10/31/14 1840  WBC 5.3  NEUTROABS 2.6  HGB 12.5*  HCT 34.3*  MCV 93.7  PLT 238   Cardiac Enzymes: No results for input(s): CKTOTAL, CKMB, CKMBINDEX, TROPONINI in the last 168 hours.  BNP (last 3 results) No results for input(s): PROBNP in the last 8760 hours. CBG: No results for input(s): GLUCAP in the last 168 hours.  Radiological Exams on Admission: Dg Ribs Unilateral W/chest Right  10/31/2014   CLINICAL DATA:  Right-sided rib pain after fall at home.  EXAM: RIGHT RIBS AND CHEST - 3+ VIEW  COMPARISON:  October 16, 2014.  FINDINGS: Mildly displaced  fractures are seen involving the posterior portions of the right tenth, eleventh and twelfth ribs. No pneumothorax or pleural effusion is noted.  IMPRESSION: Mildly displaced right tenth, eleventh and twelfth rib fractures. No acute cardiopulmonary abnormality is noted.   Electronically Signed   By: Lupita Raider, M.D.   On: 10/31/2014 19:48   Dg Pelvis 1-2 Views  10/31/2014   CLINICAL DATA:  Fall at home.  EXAM: PELVIS - 1-2 VIEW  COMPARISON:  None.  FINDINGS: There is no evidence of pelvic fracture or diastasis. No pelvic bone lesions are seen.  Sacroiliac and hip joints appear normal.  IMPRESSION: Normal pelvis.   Electronically Signed   By: Lupita Raider, M.D.   On: 10/31/2014 19:53   Dg Elbow Complete Left  10/31/2014   CLINICAL DATA:  Acute left elbow pain after fall at home.  EXAM: LEFT ELBOW - COMPLETE 3+ VIEW  COMPARISON:  October 13, 2014.  FINDINGS: There is continued presence of nondisplaced fracture involving the proximal ulna with intra-articular extension. No abnormal fat pad displacement is noted. Visualized portions of radius and ulna appear normal. Mild degenerative changes are noted.  IMPRESSION: Continued presence of proximal ulnar fracture with intra-articular extension.   Electronically Signed   By: Lupita Raider, M.D.   On: 10/31/2014 19:51   Ct Head Wo Contrast  10/31/2014   CLINICAL DATA:  Unwitnessed fall  EXAM: CT HEAD WITHOUT CONTRAST  CT CERVICAL SPINE WITHOUT CONTRAST  TECHNIQUE: Multidetector CT imaging of the head and cervical spine was performed following the standard protocol without intravenous contrast. Multiplanar CT image reconstructions of the cervical spine were also generated.  COMPARISON:  10/08/2014, 08/22/14  FINDINGS: CT HEAD FINDINGS  Bony calvarium is intact. Diffuse atrophic changes are seen. There are findings consistent with encephalomalacia in the frontal areas bilaterally. This is stable from the prior exam. No acute hemorrhage or acute infarct is  identified. No space-occupying mass lesion is noted.  CT CERVICAL SPINE FINDINGS  Seven cervical segments are well visualized. Vertebral body height is well maintained. Osteophytic changes are noted anteriorly at C4-5, C5-6 and C6-7. Mild facet hypertrophic changes are noted. No acute fracture or acute facet abnormality is seen. No gross soft tissue abnormality is seen. Carotid calcifications are noted.  IMPRESSION: CT of the head: Chronic ischemic/traumatic changes without acute abnormality.  CT of the cervical spine: Degenerative change without acute abnormality.   Electronically Signed   By: Alcide Clever M.D.   On: 10/31/2014 21:07   Ct Chest W Contrast  10/31/2014   CLINICAL DATA:  Seizure, elbow pain, probable fall  EXAM: CT CHEST, ABDOMEN, AND PELVIS WITH CONTRAST  TECHNIQUE: Multidetector CT imaging of the chest, abdomen and pelvis was performed following the standard protocol during bolus administration of intravenous contrast.  CONTRAST:  1 OMNIPAQUE IOHEXOL 300 MG/ML  SOLN  COMPARISON:  10/11/2014  FINDINGS: CT CHEST FINDINGS  Sagittal images of the spine shows no acute fractures. Sagittal view of the sternum is unremarkable. No scapular fracture. No definite clavicle fracture.  Atherosclerotic calcifications of thoracic aorta. Heart size within normal limits. No pericardial effusion.  At least 3 or 4 healing fractures are noted in right posterior lower ribs. Healing fracture in left posterior rib noted in axial image 56. Three there is nondisplaced acute fracture of the right 12 rib. See axial image 65. Some pleural thickening noted in right posterior hemithorax.  There is no mediastinal hematoma or adenopathy. No pericardial effusion.  No lung contusion or pneumothorax. Mild atelectasis noted in right lower lobe posteriorly. There is no pneumothorax.  CT ABDOMEN AND PELVIS FINDINGS  Sagittal images of the lumbar spine shows significant disc space flattening with vacuum disc phenomenon at L4-L5 and  L5-S1 level. Large anterior osteophytes and moderate disc space flattening at L3-L4 level. Atherosclerotic calcifications of abdominal aorta and iliac arteries. Enhanced liver is unremarkable. No calcified gallstones are noted within gallbladder. Enhanced pancreas, spleen and adrenal glands are unremarkable. Degenerative changes bilateral SI joints. No sacral fracture is noted.  There is no evidence of urinary bladder injury.  No pelvic fractures are noted. No inguinal adenopathy. Enhanced kidneys are symmetrical in size. No hydronephrosis or hydroureter. Delayed renal images shows bilateral renal symmetrical excretion.  No pericecal inflammation. The terminal ileum is unremarkable. Normal appendix.  Coronal images shows bilateral hip joint symmetrical in appearance. No hip fractures.  IMPRESSION: 1. There are bilateral posterior healing fractures as described above. Question tiny nondisplaced acute fracture of the right twelfth rib. 2. No lung contusion or pneumothorax. 3. No mediastinal hematoma or adenopathy. 4. Degenerative changes bilateral SI joints. Degenerative changes lumbar spine. 5. No acute visceral injury within abdomen or pelvis. 6. No pericecal inflammation.  Normal appendix. 7. No evidence of urinary bladder injury. 8. No acute fractures are noted within abdomen and pelvis. Degenerative changes lumbar spine. 9.   Electronically Signed   By: Natasha Mead M.D.   On: 10/31/2014 21:10   Ct Cervical Spine Wo Contrast  10/31/2014   CLINICAL DATA:  Unwitnessed fall  EXAM: CT HEAD WITHOUT CONTRAST  CT CERVICAL SPINE WITHOUT CONTRAST  TECHNIQUE: Multidetector CT imaging of the head and cervical spine was performed following the standard protocol without intravenous contrast. Multiplanar CT image reconstructions of the cervical spine were also generated.  COMPARISON:  10/08/2014, 08/22/14  FINDINGS: CT HEAD FINDINGS  Bony calvarium is intact. Diffuse atrophic changes are seen. There are findings consistent with  encephalomalacia in the frontal areas bilaterally. This is stable from the prior exam. No acute hemorrhage or acute infarct is identified. No space-occupying mass lesion is noted.  CT CERVICAL SPINE FINDINGS  Seven cervical segments are well visualized. Vertebral body height is well maintained. Osteophytic changes are noted anteriorly at C4-5, C5-6 and C6-7. Mild facet hypertrophic changes are noted. No acute fracture or acute facet abnormality is seen. No gross soft tissue abnormality is seen. Carotid calcifications are noted.  IMPRESSION: CT of the head: Chronic ischemic/traumatic changes without acute abnormality.  CT of the cervical spine: Degenerative change without acute abnormality.   Electronically Signed   By: Alcide Clever M.D.   On: 10/31/2014 21:07   Ct Abdomen Pelvis W Contrast  10/31/2014   CLINICAL DATA:  Seizure, elbow pain, probable fall  EXAM: CT CHEST, ABDOMEN, AND PELVIS WITH CONTRAST  TECHNIQUE: Multidetector CT imaging of the chest, abdomen and pelvis was performed following the standard protocol during bolus administration of intravenous contrast.  CONTRAST:  1 OMNIPAQUE IOHEXOL 300 MG/ML  SOLN  COMPARISON:  10/11/2014  FINDINGS: CT CHEST FINDINGS  Sagittal images of the spine shows no acute fractures. Sagittal view of the sternum is unremarkable. No scapular fracture. No definite clavicle fracture.  Atherosclerotic calcifications of thoracic aorta. Heart size within normal limits. No pericardial effusion.  At least 3 or 4 healing fractures are noted in right posterior lower ribs. Healing fracture in left posterior rib noted in axial image 56. Three there is nondisplaced acute fracture of the right 12 rib. See axial image 65. Some pleural thickening noted in right posterior hemithorax.  There is no mediastinal hematoma or adenopathy. No pericardial effusion.  No lung contusion or pneumothorax. Mild atelectasis noted in right lower lobe posteriorly. There is no pneumothorax.  CT ABDOMEN AND  PELVIS FINDINGS  Sagittal images of the lumbar spine shows significant disc space flattening with vacuum disc phenomenon at L4-L5 and L5-S1 level. Large anterior osteophytes and moderate disc space flattening at L3-L4 level. Atherosclerotic calcifications of abdominal aorta and iliac arteries. Enhanced liver is unremarkable. No calcified gallstones are noted within gallbladder. Enhanced pancreas, spleen  and adrenal glands are unremarkable. Degenerative changes bilateral SI joints. No sacral fracture is noted.  There is no evidence of urinary bladder injury. No pelvic fractures are noted. No inguinal adenopathy. Enhanced kidneys are symmetrical in size. No hydronephrosis or hydroureter. Delayed renal images shows bilateral renal symmetrical excretion.  No pericecal inflammation. The terminal ileum is unremarkable. Normal appendix.  Coronal images shows bilateral hip joint symmetrical in appearance. No hip fractures.  IMPRESSION: 1. There are bilateral posterior healing fractures as described above. Question tiny nondisplaced acute fracture of the right twelfth rib. 2. No lung contusion or pneumothorax. 3. No mediastinal hematoma or adenopathy. 4. Degenerative changes bilateral SI joints. Degenerative changes lumbar spine. 5. No acute visceral injury within abdomen or pelvis. 6. No pericecal inflammation.  Normal appendix. 7. No evidence of urinary bladder injury. 8. No acute fractures are noted within abdomen and pelvis. Degenerative changes lumbar spine. 9.   Electronically Signed   By: Natasha Mead M.D.   On: 10/31/2014 21:10    EKG: Independently reviewed.  Assessment/Plan Principal Problem:   Hyponatremia Active Problems:   History of alcohol abuse   Seizure   1. 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 Fluid restrict diet 2. Reduce tegretol dose to 200mg  TID, ultimately will attempt to wean off 3. Repeat BMP in AM 2. Seizure history - 1. Increase keppra to 1500 BID 2. Plan to wean off of tegretol  due to issue #1 above 3. Continue lyrica 3. H/o EtOH abuse - putting patient on CIWA protocol.  Spoke with Dr. Amada Jupiter on phone about the above plan.  Code Status: Full  Family Communication: No family in room Disposition Plan: Admit to inpatient   Time spent: 70 min  Ricki Clack M. [facility] Pager [phone]  If 7AM-7PM, please contact the day team taking care of the patient [email or web address] Password [facility] 10/31/2014, 9:53 PM

## 2014-11-01 DIAGNOSIS — R7881 Bacteremia: Secondary | ICD-10-CM

## 2014-11-01 DIAGNOSIS — B9562 Methicillin resistant Staphylococcus aureus infection as the cause of diseases classified elsewhere: Secondary | ICD-10-CM

## 2014-11-01 LAB — BASIC METABOLIC PANEL
Anion gap: 6 (ref 5–15)
BUN: 6 mg/dL (ref 6–20)
CO2: 28 mmol/L (ref 22–32)
CREATININE: 0.6 mg/dL — AB (ref 0.61–1.24)
Calcium: 8 mg/dL — ABNORMAL LOW (ref 8.9–10.3)
Chloride: 95 mmol/L — ABNORMAL LOW (ref 101–111)
GFR calc non Af Amer: >60 mL/min (ref >60)
Glucose, Bld: 94 mg/dL (ref 65–99)
POTASSIUM: 3.7 mmol/L (ref 3.5–5.1)
SODIUM: 129 mmol/L — AB (ref 135–145)

## 2014-11-01 LAB — CBC
HEMATOCRIT: 33.1 % — AB (ref 39.0–52.0)
HEMOGLOBIN: 11.7 g/dL — AB (ref 13.0–17.0)
MCH: 33.6 pg (ref 26.0–34.0)
MCHC: 35.3 g/dL (ref 30.0–36.0)
MCV: 95.1 fL (ref 78.0–100.0)
PLATELETS: 202 10*3/uL (ref 150–400)
RBC: 3.48 MIL/uL — AB (ref 4.22–5.81)
RDW: 12.5 % (ref 11.5–15.5)
WBC: 4.5 10*3/uL (ref 4.0–10.5)

## 2014-11-01 MED ORDER — HYDROCODONE-ACETAMINOPHEN 5-325 MG PO TABS
2.0000 | ORAL_TABLET | Freq: Four times a day (QID) | ORAL | Status: DC | PRN
  Administered 2014-11-01 – 2014-11-06 (×18): 2 via ORAL
  Filled 2014-11-01 (×18): qty 2

## 2014-11-01 MED ORDER — LORAZEPAM 2 MG/ML IJ SOLN
1.0000 mg | Freq: Once | INTRAMUSCULAR | Status: AC
  Administered 2014-11-01: 1 mg via INTRAVENOUS
  Filled 2014-11-01: qty 1

## 2014-11-01 MED ORDER — LORAZEPAM 2 MG/ML IJ SOLN
2.0000 mg | Freq: Once | INTRAMUSCULAR | Status: AC
  Administered 2014-11-03: 2 mg via INTRAVENOUS
  Filled 2014-11-01: qty 1

## 2014-11-01 NOTE — Progress Notes (Signed)
[facility] PROGRESS NOTE  [name] [name] WUJ:[id number] DOB: 05/27/1960 DOA: 10/31/2014 PCP: [name], MD  54 year old male with history of alcohol abuse, chronic seizures (secondary to aneurysmal rupture , on Tegretol and Keppra as outpatient) recently hospitalized after sustaining a fall, hypertension, tobacco abuse and hepatitis C, recently hospitalized with acute metabolic encephalopathy secondary to Tegretol toxicity, with prolonged hospital stay due to hyponatremia and MRSA bacteremia. Patient signed out AMA on 8/1 9 as he did not want to miss his pay check and did not take any medications with him. He was then taking 400 mg of Tegretol 3 times a day at home. He presented to the ED on 8/12 with a fall secondary to possible seizure and hitting his left elbow. Patient was found to be hyponatremic with sodium of 122. Admitted to telemetry.  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 He has had issue of hyponatremia with tegretol and we had decided to wheezes Are to 1500 mg twice a day and stop Tegretol during recent hospitalization. Patient however continued high-dose tegretol after signing out AMA. Continue fluid restriction. Reduced tegretol to 200 mg 3 times a day after discussing with neurohospitalist. Will taper of tegretol and discharge on increased dose of keppra. -Continue Lyrica.  MRSA bacteremia  1/2 blood cx from 7/28. Was being treated with IV vancomycin and levaquin empirically during last admission but pt signed out AMA. Was planned on placing a PICC line. Patient has been refusing skilled nursing facility. He may not be compliant with IV antibiotic therapy. Spoke with  ID consult Dr. Algis Liming. Recommends repeat blood cultures and MRI of the brain to rule out brain abscess given bacteremia and recurrent seizures. 2-D echo done during last hospitalization was negative for any vegetation. Will place on empiric IV vancomycin and blood cultures  obtained.  Seizure Disorder As outlined above. Head CT, cervical spine, CT chest, abdomen and pelvis unremarkable. X-ray of the left elbow shows continue fracture left ulna.  Alcohol abuse  Monitor on  CIWA  Left ulnar fracture S/ fall . Pain control with prn Vicodin. Patient is supposed to see orthopedics as outpatient.  DVT prophylaxis:   diet: Regular with fluid Restriction  Code Status: full code Family Communication: none at [place] Disposition Plan: home once stable   Consultants:  ID  Procedures:  Head CT  MRI brian    Antibiotics:  IV vancomycin  HPI/Subjective: Seen and examined. Patient noted to be rude to the nurse this morning. Also appears pt was smoking in his room and was conuseled  Objective: Filed Vitals:   11/01/14 0924  BP: 126/70  Pulse: 55  Temp:   Resp:     Intake/Output Summary (Last 24 hours) at 11/01/14 1303 Last data filed at 11/01/14 1145  Gross per 24 hour  Intake   1530 ml  Output   2650 ml  Net  -1120 ml   Filed Weights   10/31/14 2315  Weight: 88.6 kg (195 lb 5.2 oz)    Exam:   General: Middle aged male in no acute distress, irritable  HEENT: No pallor, moist oral mucosa  Chest: Clear to auscultation bilaterally  CVS: Normal S1 and S2, normal  GI: Soft, nondistended, nontender, bowel sounds present  Musculoskeletal: Ace wrap over left arm, no edema  CNS: Alert and oriented      Data Reviewed: Basic Metabolic Panel:  Recent Labs Lab 10/31/14 1840 11/01/14 0550  NA 122* 129*  K 3.8 3.7  CL 88* 95*  CO2 24  28  GLUCOSE 83 94  BUN <5* 6  CREATININE 0.59* 0.60*  CALCIUM 8.3* 8.0*   Liver Function Tests:  Recent Labs Lab 10/31/14 1840  AST 25  ALT 19  ALKPHOS 88  BILITOT 0.4  PROT 7.1  ALBUMIN 3.4*   No results for input(s): LIPASE, AMYLASE in the last 168 hours. No results for input(s): AMMONIA in the last 168 hours. CBC:  Recent Labs Lab 10/31/14 1840 11/01/14 0550  WBC 5.3  4.5  NEUTROABS 2.6  --   HGB 12.5* 11.7*  HCT 34.3* 33.1*  MCV 93.7 95.1  PLT 238 202   Cardiac Enzymes: No results for input(s): CKTOTAL, CKMB, CKMBINDEX, TROPONINI in the last 168 hours. BNP (last 3 results)  Recent Labs  10/14/14 1825  BNP 64.4    ProBNP (last 3 results) No results for input(s): PROBNP in the last 8760 hours.  CBG: No results for input(s): GLUCAP in the last 168 hours.  No results found for this or any previous visit (from the past 240 hour(s)).   Studies: Dg Ribs Unilateral W/chest Right  10/31/2014   CLINICAL DATA:  Right-sided rib pain after fall at home.  EXAM: RIGHT RIBS AND CHEST - 3+ VIEW  COMPARISON:  October 16, 2014.  FINDINGS: Mildly displaced fractures are seen involving the posterior portions of the right tenth, eleventh and twelfth ribs. No pneumothorax or pleural effusion is noted.  IMPRESSION: Mildly displaced right tenth, eleventh and twelfth rib fractures. No acute cardiopulmonary abnormality is noted.   Electronically Signed   By: Lupita Raider, M.D.   On: 10/31/2014 19:48   Dg Pelvis 1-2 Views  10/31/2014   CLINICAL DATA:  Fall at home.  EXAM: PELVIS - 1-2 VIEW  COMPARISON:  None.  FINDINGS: There is no evidence of pelvic fracture or diastasis. No pelvic bone lesions are seen. Sacroiliac and hip joints appear normal.  IMPRESSION: Normal pelvis.   Electronically Signed   By: Lupita Raider, M.D.   On: 10/31/2014 19:53   Dg Elbow Complete Left  10/31/2014   CLINICAL DATA:  Acute left elbow pain after fall at home.  EXAM: LEFT ELBOW - COMPLETE 3+ VIEW  COMPARISON:  October 13, 2014.  FINDINGS: There is continued presence of nondisplaced fracture involving the proximal ulna with intra-articular extension. No abnormal fat pad displacement is noted. Visualized portions of radius and ulna appear normal. Mild degenerative changes are noted.  IMPRESSION: Continued presence of proximal ulnar fracture with intra-articular extension.   Electronically Signed    By: Lupita Raider, M.D.   On: 10/31/2014 19:51   Ct Head Wo Contrast  10/31/2014   CLINICAL DATA:  Unwitnessed fall  EXAM: CT HEAD WITHOUT CONTRAST  CT CERVICAL SPINE WITHOUT CONTRAST  TECHNIQUE: Multidetector CT imaging of the head and cervical spine was performed following the standard protocol without intravenous contrast. Multiplanar CT image reconstructions of the cervical spine were also generated.  COMPARISON:  10/08/2014, 08/22/14  FINDINGS: CT HEAD FINDINGS  Bony calvarium is intact. Diffuse atrophic changes are seen. There are findings consistent with encephalomalacia in the frontal areas bilaterally. This is stable from the prior exam. No acute hemorrhage or acute infarct is identified. No space-occupying mass lesion is noted.  CT CERVICAL SPINE FINDINGS  Seven cervical segments are well visualized. Vertebral body height is well maintained. Osteophytic changes are noted anteriorly at C4-5, C5-6 and C6-7. Mild facet hypertrophic changes are noted. No acute fracture or acute facet abnormality is seen. No  gross soft tissue abnormality is seen. Carotid calcifications are noted.  IMPRESSION: CT of the head: Chronic ischemic/traumatic changes without acute abnormality.  CT of the cervical spine: Degenerative change without acute abnormality.   Electronically Signed   By: Alcide Clever M.D.   On: 10/31/2014 21:07   Ct Chest W Contrast  10/31/2014   CLINICAL DATA:  Seizure, elbow pain, probable fall  EXAM: CT CHEST, ABDOMEN, AND PELVIS WITH CONTRAST  TECHNIQUE: Multidetector CT imaging of the chest, abdomen and pelvis was performed following the standard protocol during bolus administration of intravenous contrast.  CONTRAST:  1 OMNIPAQUE IOHEXOL 300 MG/ML  SOLN  COMPARISON:  10/11/2014  FINDINGS: CT CHEST FINDINGS  Sagittal images of the spine shows no acute fractures. Sagittal view of the sternum is unremarkable. No scapular fracture. No definite clavicle fracture.  Atherosclerotic calcifications of  thoracic aorta. Heart size within normal limits. No pericardial effusion.  At least 3 or 4 healing fractures are noted in right posterior lower ribs. Healing fracture in left posterior rib noted in axial image 56. Three there is nondisplaced acute fracture of the right 12 rib. See axial image 65. Some pleural thickening noted in right posterior hemithorax.  There is no mediastinal hematoma or adenopathy. No pericardial effusion.  No lung contusion or pneumothorax. Mild atelectasis noted in right lower lobe posteriorly. There is no pneumothorax.  CT ABDOMEN AND PELVIS FINDINGS  Sagittal images of the lumbar spine shows significant disc space flattening with vacuum disc phenomenon at L4-L5 and L5-S1 level. Large anterior osteophytes and moderate disc space flattening at L3-L4 level. Atherosclerotic calcifications of abdominal aorta and iliac arteries. Enhanced liver is unremarkable. No calcified gallstones are noted within gallbladder. Enhanced pancreas, spleen and adrenal glands are unremarkable. Degenerative changes bilateral SI joints. No sacral fracture is noted.  There is no evidence of urinary bladder injury. No pelvic fractures are noted. No inguinal adenopathy. Enhanced kidneys are symmetrical in size. No hydronephrosis or hydroureter. Delayed renal images shows bilateral renal symmetrical excretion.  No pericecal inflammation. The terminal ileum is unremarkable. Normal appendix.  Coronal images shows bilateral hip joint symmetrical in appearance. No hip fractures.  IMPRESSION: 1. There are bilateral posterior healing fractures as described above. Question tiny nondisplaced acute fracture of the right twelfth rib. 2. No lung contusion or pneumothorax. 3. No mediastinal hematoma or adenopathy. 4. Degenerative changes bilateral SI joints. Degenerative changes lumbar spine. 5. No acute visceral injury within abdomen or pelvis. 6. No pericecal inflammation.  Normal appendix. 7. No evidence of urinary bladder  injury. 8. No acute fractures are noted within abdomen and pelvis. Degenerative changes lumbar spine. 9.   Electronically Signed   By: Natasha Mead M.D.   On: 10/31/2014 21:10   Ct Cervical Spine Wo Contrast  10/31/2014   CLINICAL DATA:  Unwitnessed fall  EXAM: CT HEAD WITHOUT CONTRAST  CT CERVICAL SPINE WITHOUT CONTRAST  TECHNIQUE: Multidetector CT imaging of the head and cervical spine was performed following the standard protocol without intravenous contrast. Multiplanar CT image reconstructions of the cervical spine were also generated.  COMPARISON:  10/08/2014, 08/22/14  FINDINGS: CT HEAD FINDINGS  Bony calvarium is intact. Diffuse atrophic changes are seen. There are findings consistent with encephalomalacia in the frontal areas bilaterally. This is stable from the prior exam. No acute hemorrhage or acute infarct is identified. No space-occupying mass lesion is noted.  CT CERVICAL SPINE FINDINGS  Seven cervical segments are well visualized. Vertebral body height is well maintained. Osteophytic changes are  noted anteriorly at C4-5, C5-6 and C6-7. Mild facet hypertrophic changes are noted. No acute fracture or acute facet abnormality is seen. No gross soft tissue abnormality is seen. Carotid calcifications are noted.  IMPRESSION: CT of the head: Chronic ischemic/traumatic changes without acute abnormality.  CT of the cervical spine: Degenerative change without acute abnormality.   Electronically Signed   By: Alcide Clever M.D.   On: 10/31/2014 21:07   Ct Abdomen Pelvis W Contrast  10/31/2014   CLINICAL DATA:  Seizure, elbow pain, probable fall  EXAM: CT CHEST, ABDOMEN, AND PELVIS WITH CONTRAST  TECHNIQUE: Multidetector CT imaging of the chest, abdomen and pelvis was performed following the standard protocol during bolus administration of intravenous contrast.  CONTRAST:  1 OMNIPAQUE IOHEXOL 300 MG/ML  SOLN  COMPARISON:  10/11/2014  FINDINGS: CT CHEST FINDINGS  Sagittal images of the spine shows no acute  fractures. Sagittal view of the sternum is unremarkable. No scapular fracture. No definite clavicle fracture.  Atherosclerotic calcifications of thoracic aorta. Heart size within normal limits. No pericardial effusion.  At least 3 or 4 healing fractures are noted in right posterior lower ribs. Healing fracture in left posterior rib noted in axial image 56. Three there is nondisplaced acute fracture of the right 12 rib. See axial image 65. Some pleural thickening noted in right posterior hemithorax.  There is no mediastinal hematoma or adenopathy. No pericardial effusion.  No lung contusion or pneumothorax. Mild atelectasis noted in right lower lobe posteriorly. There is no pneumothorax.  CT ABDOMEN AND PELVIS FINDINGS  Sagittal images of the lumbar spine shows significant disc space flattening with vacuum disc phenomenon at L4-L5 and L5-S1 level. Large anterior osteophytes and moderate disc space flattening at L3-L4 level. Atherosclerotic calcifications of abdominal aorta and iliac arteries. Enhanced liver is unremarkable. No calcified gallstones are noted within gallbladder. Enhanced pancreas, spleen and adrenal glands are unremarkable. Degenerative changes bilateral SI joints. No sacral fracture is noted.  There is no evidence of urinary bladder injury. No pelvic fractures are noted. No inguinal adenopathy. Enhanced kidneys are symmetrical in size. No hydronephrosis or hydroureter. Delayed renal images shows bilateral renal symmetrical excretion.  No pericecal inflammation. The terminal ileum is unremarkable. Normal appendix.  Coronal images shows bilateral hip joint symmetrical in appearance. No hip fractures.  IMPRESSION: 1. There are bilateral posterior healing fractures as described above. Question tiny nondisplaced acute fracture of the right twelfth rib. 2. No lung contusion or pneumothorax. 3. No mediastinal hematoma or adenopathy. 4. Degenerative changes bilateral SI joints. Degenerative changes lumbar  spine. 5. No acute visceral injury within abdomen or pelvis. 6. No pericecal inflammation.  Normal appendix. 7. No evidence of urinary bladder injury. 8. No acute fractures are noted within abdomen and pelvis. Degenerative changes lumbar spine. 9.   Electronically Signed   By: Natasha Mead M.D.   On: 10/31/2014 21:10    Scheduled Meds: . carbamazepine  200 mg Oral TID  . folic acid  1 mg Oral Daily  . heparin  5,000 Units Subcutaneous 3 times per day  . levETIRAcetam  1,500 mg Oral BID  . metoprolol  25 mg Oral BID  . multivitamin with minerals  1 tablet Oral Daily  . pregabalin  100 mg Oral BID AC & HS  . pregabalin  50 mg Oral QAC lunch  . sodium chloride  3 mL Intravenous Q12H  . thiamine  100 mg Oral Daily   Or  . thiamine  100 mg Intravenous Daily  Continuous Infusions:     Time spent: 25 minutes    Keiosha Cancro  [facility] Pager [phone]. If 7PM-7AM, please contact night-coverage at [email or web address], password [facility] 11/01/2014, 1:03 PM  LOS: 1 day

## 2014-11-01 NOTE — Progress Notes (Signed)
Patient educated on fluid restriction, but refuses teaching. Pt is referring to MD and nursing staff as "stupid f*cking nutcases" who don't know "any medicine at all." Pt states he will drink as much fluids as he likes, this RN told him after he reaches his 1200 cc limit no staff member will bring him any more fluids without MD order. Will attempt to continue education.

## 2014-11-01 NOTE — Progress Notes (Signed)
Patient's heart rate in the 50s and metoprolol  due to be given, notified Dr. Gonzella Lex and he stated not to give med. Will continue to F/u with plan of care.

## 2014-11-01 NOTE — Progress Notes (Addendum)
Pt displays bizarre and manipulative behavior. When anyone new enters the room he becomes aggressive verbally with staff, but then becomes polite when regular staff is there or he is asking for something he has been made aware of he shouldn't have such as a pitcher of water d/t fluid restriction. When staff is not in the room, he can be heard talking to himself complaining about the doctors and nurses who have taken care of them and how they don't know what they are doing. When staff is in the room he refers to [place] as "the experts." Will continue to monitor.

## 2014-11-01 NOTE — Progress Notes (Signed)
Patient is becoming very loud,verbally aggressive and becoming belligerent.  Patient refuses to wear the heart monitor.  PCP on call was paged.

## 2014-11-01 NOTE — Progress Notes (Signed)
Noted smell of cigarette in the hall wall and in patient's room, patient denies smoking or having any cigarette with him. Security notified for assistance, cigarette and liter noted on the patient's blanket, locked up in patient's med drawer. Reeducated that hospital is a smoke free hospital/environment.

## 2014-11-02 DIAGNOSIS — F0391 Unspecified dementia with behavioral disturbance: Secondary | ICD-10-CM | POA: Insufficient documentation

## 2014-11-02 DIAGNOSIS — F03918 Unspecified dementia, unspecified severity, with other behavioral disturbance: Secondary | ICD-10-CM | POA: Insufficient documentation

## 2014-11-02 DIAGNOSIS — F1020 Alcohol dependence, uncomplicated: Secondary | ICD-10-CM

## 2014-11-02 DIAGNOSIS — B9562 Methicillin resistant Staphylococcus aureus infection as the cause of diseases classified elsewhere: Secondary | ICD-10-CM | POA: Insufficient documentation

## 2014-11-02 DIAGNOSIS — W19XXXA Unspecified fall, initial encounter: Secondary | ICD-10-CM | POA: Insufficient documentation

## 2014-11-02 DIAGNOSIS — B182 Chronic viral hepatitis C: Secondary | ICD-10-CM

## 2014-11-02 DIAGNOSIS — R7881 Bacteremia: Secondary | ICD-10-CM | POA: Insufficient documentation

## 2014-11-02 DIAGNOSIS — F22 Delusional disorders: Secondary | ICD-10-CM | POA: Insufficient documentation

## 2014-11-02 DIAGNOSIS — G9341 Metabolic encephalopathy: Secondary | ICD-10-CM

## 2014-11-02 LAB — CARBAMAZEPINE LEVEL, TOTAL: Carbamazepine Lvl: 8.6 ug/mL (ref 4.0–12.0)

## 2014-11-02 LAB — BASIC METABOLIC PANEL
Anion gap: 7 (ref 5–15)
BUN: 8 mg/dL (ref 6–20)
CALCIUM: 8.5 mg/dL — AB (ref 8.9–10.3)
CHLORIDE: 102 mmol/L (ref 101–111)
CO2: 26 mmol/L (ref 22–32)
CREATININE: 0.8 mg/dL (ref 0.61–1.24)
GFR calc non Af Amer: >60 mL/min (ref >60)
GLUCOSE: 112 mg/dL — AB (ref 65–99)
POTASSIUM: 4.4 mmol/L (ref 3.5–5.1)
SODIUM: 135 mmol/L (ref 135–145)

## 2014-11-02 MED ORDER — NICOTINE 21 MG/24HR TD PT24
21.0000 mg | MEDICATED_PATCH | Freq: Every day | TRANSDERMAL | Status: DC
  Administered 2014-11-02 – 2014-11-06 (×5): 21 mg via TRANSDERMAL
  Filled 2014-11-02 (×5): qty 1

## 2014-11-02 MED ORDER — VANCOMYCIN HCL 10 G IV SOLR
1250.0000 mg | Freq: Three times a day (TID) | INTRAVENOUS | Status: DC
  Administered 2014-11-02 – 2014-11-04 (×7): 1250 mg via INTRAVENOUS
  Filled 2014-11-02 (×8): qty 1250

## 2014-11-02 NOTE — Progress Notes (Signed)
ANTIBIOTIC CONSULT NOTE - INITIAL  Pharmacy Consult for Vancomycin Indication: MRSA bacteremia from last hospitalization  No Known Allergies  Patient Measurements: Height:  (198.1 cm) Weight: 195 lb 5.2 oz (88.6 kg) IBW/kg (Calculated) : 91.4   Vital Signs: Temp: 97.7 F (36.5 C) (08/14 0551) Temp Source: Oral (08/14 0551) BP: 131/80 mmHg (08/14 0551) Pulse Rate: 65 (08/14 0551) Intake/Output from previous day: 08/13 0701 - 08/14 0700 In: 1000 [P.O.:1000] Out: 2550 [Urine:2550] Intake/Output from this shift:    Labs:  Recent Labs  10/31/14 1840 11/01/14 0550  WBC 5.3 4.5  HGB 12.5* 11.7*  PLT 238 202  CREATININE 0.59* 0.60*   Estimated Creatinine Clearance: 130.7 mL/min (by C-G formula based on Cr of 0.6). No results for input(s): VANCOTROUGH, VANCOPEAK, VANCORANDOM, GENTTROUGH, GENTPEAK, GENTRANDOM, TOBRATROUGH, TOBRAPEAK, TOBRARND, AMIKACINPEAK, AMIKACINTROU, AMIKACIN in the last 72 hours.   Microbiology: Recent Results (from the past 720 hour(s))  MRSA PCR Screening     Status: None   Collection Time: 10/08/14  4:46 PM  Result Value Ref Range Status   MRSA by PCR NEGATIVE NEGATIVE Final    Comment:        The GeneXpert MRSA Assay (FDA approved for NASAL specimens only), is one component of a comprehensive MRSA colonization surveillance program. It is not intended to diagnose MRSA infection nor to guide or monitor treatment for MRSA infections.   Urine culture     Status: None   Collection Time: 10/08/14  6:03 PM  Result Value Ref Range Status   Specimen Description URINE, CLEAN CATCH  Final   Special Requests NONE  Final   Culture   Final    MULTIPLE SPECIES PRESENT, SUGGEST RECOLLECTION Performed at [facility]    Report Status 10/10/2014 FINAL  Final  Urine culture     Status: None   Collection Time: 10/16/14 11:24 AM  Result Value Ref Range Status   Specimen Description URINE, RANDOM  Final   Special Requests NONE  Final   Culture   Final    NO GROWTH 1 DAY Performed at [facility]    Report Status 10/17/2014 FINAL  Final  Culture, blood (routine x 2)     Status: None   Collection Time: 10/16/14  4:15 PM  Result Value Ref Range Status   Specimen Description BLOOD RIGHT HAND  Final   Special Requests BOTTLES DRAWN AEROBIC AND ANAEROBIC 10CC  Final   Culture   Final    NO GROWTH 5 DAYS Performed at [facility]    Report Status 10/21/2014 FINAL  Final  Culture, blood (routine x 2)     Status: None   Collection Time: 10/16/14  4:25 PM  Result Value Ref Range Status   Specimen Description BLOOD RIGHT HAND  Final   Special Requests BOTTLES DRAWN AEROBIC AND ANAEROBIC 10CC  Final   Culture  Setup Time   Final    GRAM POSITIVE COCCI IN CLUSTERS AEROBIC BOTTLE ONLY CRITICAL RESULT CALLED TO, READ BACK BY AND VERIFIED WITH: N DUMAS,RN AT 1519 10/17/14 BY L BENFIELD    Culture   Final    METHICILLIN RESISTANT STAPHYLOCOCCUS AUREUS Performed at [facility]    Report Status 10/19/2014 FINAL  Final   Organism ID, Bacteria METHICILLIN RESISTANT STAPHYLOCOCCUS AUREUS  Final      Susceptibility   Methicillin resistant staphylococcus aureus - MIC*    CIPROFLOXACIN >=8 RESISTANT Resistant     ERYTHROMYCIN >=8 RESISTANT Resistant  GENTAMICIN <=0.5 SENSITIVE Sensitive     OXACILLIN >=4 RESISTANT Resistant     TETRACYCLINE <=1 SENSITIVE Sensitive     VANCOMYCIN 1 SENSITIVE Sensitive     TRIMETH/SULFA <=10 SENSITIVE Sensitive     CLINDAMYCIN <=0.25 SENSITIVE Sensitive     RIFAMPIN <=0.5 SENSITIVE Sensitive     Inducible Clindamycin NEGATIVE Sensitive     * METHICILLIN RESISTANT STAPHYLOCOCCUS AUREUS  Culture, blood (routine x 2)     Status: None   Collection Time: 10/18/14  6:30 PM  Result Value Ref Range Status   Specimen Description BLOOD LEFT HAND  Final   Special Requests BOTTLES DRAWN AEROBIC ONLY 5CC  Final   Culture   Final    NO GROWTH 5 DAYS Performed at [facility]    Report Status 10/23/2014 FINAL  Final  Culture, blood (routine x 2)     Status: None   Collection Time: 10/18/14  6:35 PM  Result Value Ref Range Status   Specimen Description BLOOD RIGHT ANTECUBITAL  Final   Special Requests BOTTLES DRAWN AEROBIC ONLY 6CC  Final   Culture   Final    NO GROWTH 5 DAYS Performed at [facility]    Report Status 10/23/2014 FINAL  Final  Culture, blood (routine x 2)     Status: None (Preliminary result)   Collection Time: 11/01/14  2:07 PM  Result Value Ref Range Status   Specimen Description BLOOD RIGHT FOREARM  Final   Special Requests   Final    BOTTLES DRAWN AEROBIC AND ANAEROBIC 10CC Performed at [facility]    Culture PENDING  Incomplete   Report Status PENDING  Incomplete    Medical History: Past Medical History  Diagnosis Date  . Seizure   . ETOHism   . Aneurysm   . Hypertension   . Hepatitis C   . Hereditary and idiopathic peripheral neuropathy 06/03/2014    Medications:  Scheduled:  . carbamazepine  200 mg Oral TID  . folic acid  1 mg Oral Daily  . heparin  5,000 Units Subcutaneous 3 times per day  . levETIRAcetam  1,500 mg Oral BID  . LORazepam  2 mg Intravenous Once  . metoprolol  25 mg Oral BID  . multivitamin with minerals  1 tablet Oral Daily  . pregabalin  100 mg Oral BID AC & HS  . pregabalin  50 mg Oral QAC lunch  . sodium chloride  3 mL Intravenous Q12H  . thiamine  100 mg Oral Daily   Or  . thiamine  100 mg Intravenous Daily   Infusions:   PRN: HYDROcodone-acetaminophen, LORazepam **OR** LORazepam Assessment: [name] [name]  with history of alcohol and tobacco abuse, chronic seizures, HTN, and Hep C who was recently hospitalized with acute metabolic encephalopathy secondary to Tegretol toxicity, hyponatremia and MRSA bacteremia. His blood Cx (1/2) on 7/28 grew MRSA, treated with IV vancomycin and the plan was to place a PICC line. He left AMA on 8/1 and presented to the ED on 8/12 with a fall  secondary to possible seizure. ID recommended repeat blood cultures and brain MRI to r/o brain abcess given bacteremia and recurrent seizures. Pt was previously on Vancomycin 1gm IV Q8h with Vanco trough level of 11. Scr is stable  Goal of Therapy:  Vancomycin trough level 15-20 mcg/ml Eradication of infection  Plan:  Will start Vancomycin 1250mg  IV q8h  Check Vanco trough level at steady state F/u Scr, Blood cx, brain MRI (ruling out brain  abscess)   Dorethea Clan PharmD 11/02/2014,7:24 AM

## 2014-11-02 NOTE — Progress Notes (Addendum)
RN had just come out of room after tucking patient back into bed with multiple blankets that he asked for. RN made sure urinal was right bedside patient and then left the room. Soon after, bed alarm went off. RN ran to room and patient was already up at the sink saying that he thought he was in his kitchen and wanted to make a sandwich, then he realized he was not at home. RN was standing in the room with patient, when patient just fell to the floor. He fell onto his buttocks, and did not hit his head. Oncall PA made aware at 0115 and fall occurred at 0100. Vital signs taken and reassessment done. Vitals were 98.3 temp, 137/73, HR 64, Resp 20,100 percent on RA. There was no family to notify. No new injuries. Will continue to monitor patient. Bed Alarm was reset.

## 2014-11-02 NOTE — Progress Notes (Signed)
Notified by nursing staff that the patient was belligerent and rude. Refusing telemetry. I spoke with the patient who stated that [name] [name] wanted to go to [facility] because [name] [name] wasn't being treated properly, mainly that [name] [name] had not received his meals on time today. [name] [name] also stated that [name] [name] was uncomfortable in the bed and that his ribs were hurting where [name] [name] had injured them previously. I discussed with the patient the importance of staying in the hospital rather than leaving AMA again. [name] [name] agreed to stay. Concern for impending DTs given his behaviors, ativan ordered one time dose.

## 2014-11-02 NOTE — Progress Notes (Signed)
   11/02/14 0130  What Happened  Was fall witnessed? Yes  Who witnessed fall? Linward Natal  Patients activity before fall ambulating-unassisted (got up from bed alarm)  Point of contact buttocks  Was patient injured? No  Follow Up  MD notified [place], [place]  Time MD notified 0115  Family notified No- patient refusal (no family)  Additional tests No  Adult Fall Risk Assessment  Risk Factor Category (scoring not indicated) High fall risk per protocol (document High fall risk)  Patient's Fall Risk High Fall Risk (>13 points)  Adult Fall Risk Interventions  Required Bundle Interventions *See Row Information* High fall risk - low, moderate, and high requirements implemented  Fall with Injury Screening  Risk For Fall Injury- See Row Information  Nurse judgement  Neurological  Neuro (WDL) X  Level of Consciousness Alert  Orientation Level Oriented X4  Cognition Poor attention/concentration;Memory impairment;Follows commands  Speech Clear  Neuro Symptoms Agitation;Anxiety  Musculoskeletal  Musculoskeletal (WDL) X  Generalized Weakness Yes  Weight Bearing Restrictions No  Musculoskeletal Details  LUE Injury/trauma;Ortho/Supportive Device  LUE Ortho/Supportive Device Ace wrap;Splint  Integumentary  Integumentary (WDL) X  Skin Color Appropriate for ethnicity  Skin Condition Dry;Flaky  Skin Integrity Abrasion  Abrasion Location Leg  Abrasion Location Orientation Lower;Right;Left  Ecchymosis Location Rib  Ecchymosis Location Orientation Right;Left  Skin Turgor Non-tenting

## 2014-11-02 NOTE — Consult Note (Addendum)
[facility] for Infectious Disease    Date of Admission:  10/31/2014  Date of Consult:  11/02/2014  Reason for Consult: MRSA bacteremia and confusion Referring Physician: Dr Clementeen Graham   HPI: [name] [name] is an 54 y.o. male with multiple medical problems including alcoholism chronic hepatitis C without hepatic,, cerebral aneurysm repair, multiple orthopedic surgeries who was admitted recently with encephalopathy.during that hospitalization blood cultures were drawn and one out of 2 came back positive for methicillin-resistant staph coccus aureus. The patient was being treated with vancomycin but then left AGAINST MEDICAL ADVICE.  He had replete blood culture done which were negative on July 30.  He underwent a transthoracic echocardiogram which did not show any vegetations.  He was seen by my partner Dr. Linus Salmons who recommended that the patient be potentially placed in skilled nursing. But again he left AGAINST MEDICAL ADVICE prior to any of this being arranged.  He is now been admitted with seizures again and is suffering from hyponatremia, and metabolic encephalopathy. Repeat blood cultures have been obtained.  He has sustained a fall on his left arm and he is worried about infection there.  During the interview he exhibits paranoid ideation with re to MRSA and to his care here at the hospital.   Past Medical History  Diagnosis Date  . Seizure   . ETOHism   . Aneurysm   . Hypertension   . Hepatitis C   . Hereditary and idiopathic peripheral neuropathy 06/03/2014    Past Surgical History  Procedure Laterality Date  . Cerebral aneurysm repair      At [facility]  . I&d extremity Left 10/26/2012    Procedure: IRRIGATION AND DEBRIDEMENT Left Elbow;  Surgeon: Renette Butters, MD;  Location: [place];  Service: Orthopedics;  Laterality: Left;  . I&d extremity Left 10/29/2012    Procedure: IRRIGATION AND DEBRIDEMENT EXTREMITY, wound vac change, stimulan  beads;  Surgeon: Renette Butters, MD;  Location: [place];  Service: Orthopedics;  Laterality: Left;  lateral on bean bag  . Hardware removal Left 10/29/2012    Procedure: HARDWARE REMOVAL;  Surgeon: Renette Butters, MD;  Location: [place];  Service: Orthopedics;  Laterality: Left;  . Incision and drainage abscess Left 11/02/2012    Procedure: INCISION AND DRAINAGE ABSCESS;  Surgeon: Renette Butters, MD;  Location: [place];  Service: Orthopedics;  Laterality: Left;  . I&d extremity Left 11/02/2012    Procedure: IRRIGATION AND DEBRIDEMENT EXTREMITY;  Surgeon: Renette Butters, MD;  Location: [place];  Service: Orthopedics;  Laterality: Left;  ergies:   No Known Allergies   Medications: I have reviewed patients current medications as documented in Epic Anti-infectives    Start     Dose/Rate Route Frequency Ordered Stop   11/02/14 0800  vancomycin (VANCOCIN) 1,250 mg in sodium chloride 0.9 % 250 mL IVPB     1,250 mg 166.7 mL/hr over 90 Minutes Intravenous Every 8 hours 11/02/14 0740        Social History:  reports that he has quit smoking. His smoking use included Cigarettes. He has never used smokeless tobacco. He reports that he does not drink alcohol or use illicit drugs.  Family History  Problem Relation Age of Onset  . Hypertension Mother   . Cancer Mother   . Hypertension Father   . Diabetes Sister   . Cancer Sister   . Cancer Sister     As in HPI  and primary teams notes otherwise 12 point review of systems is negative  Blood pressure 145/87, pulse 71, temperature 97.9 F (36.6 C), temperature source Oral, resp. rate 20, height _0  (1.981 m), weight 195 lb 5.2 oz (88.6 kg), SpO2 98 %. General: Alert and awake, oriented to person and hospital but is my opinion paranoid HEENT: I, oropharynx clear and without exudate CVS tachycardia , normal r,  no murmur rubs or gallops Chest: clear to auscultation bilaterally, no wheezing, rales or rhonchi Abdomen: soft nontender, nondistended,  normal bowel sounds, Extremities:   Left elbow with wound  11/02/14:          Neuro: nonfocal, strength and sensation intact   Results for orders placed or performed during the hospital encounter of 10/31/14 (from the past 48 hour(s))  CBC with Differential     Status: Abnormal   Collection Time: 10/31/14  6:40 PM  Result Value Ref Range   WBC 5.3 4.0 - 10.5 K/uL   RBC 3.66 (L) 4.22 - 5.81 MIL/uL   Hemoglobin 12.5 (L) 13.0 - 17.0 g/dL   HCT 34.3 (L) 39.0 - 52.0 %   MCV 93.7 78.0 - 100.0 fL   MCH 34.2 (H) 26.0 - 34.0 pg   MCHC 36.4 (H) 30.0 - 36.0 g/dL   RDW 12.1 11.5 - 15.5 %   Platelets 238 150 - 400 K/uL   Neutrophils Relative % 49 43 - 77 %   Neutro Abs 2.6 1.7 - 7.7 K/uL   Lymphocytes Relative 42 12 - 46 %   Lymphs Abs 2.2 0.7 - 4.0 K/uL   Monocytes Relative 9 3 - 12 %   Monocytes Absolute 0.5 0.1 - 1.0 K/uL   Eosinophils Relative 0 0 - 5 %   Eosinophils Absolute 0.0 0.0 - 0.7 K/uL   Basophils Relative 0 0 - 1 %   Basophils Absolute 0.0 0.0 - 0.1 K/uL  Comprehensive metabolic panel     Status: Abnormal   Collection Time: 10/31/14  6:40 PM  Result Value Ref Range   Sodium 122 (L) 135 - 145 mmol/L   Potassium 3.8 3.5 - 5.1 mmol/L   Chloride 88 (L) 101 - 111 mmol/L   CO2 24 22 - 32 mmol/L   Glucose, Bld 83 65 - 99 mg/dL   BUN <5 (L) 6 - 20 mg/dL   Creatinine, Ser 0.59 (L) 0.61 - 1.24 mg/dL   Calcium 8.3 (L) 8.9 - 10.3 mg/dL   Total Protein 7.1 6.5 - 8.1 g/dL   Albumin 3.4 (L) 3.5 - 5.0 g/dL   AST 25 15 - 41 U/L   ALT 19 17 - 63 U/L   Alkaline Phosphatase 88 38 - 126 U/L   Total Bilirubin 0.4 0.3 - 1.2 mg/dL   GFR calc non Af Amer >60 >60 mL/min   GFR calc Af Amer >60 >60 mL/min    Comment: (NOTE) The eGFR has been calculated using the CKD EPI equation. This calculation has not been validated in all clinical situations. eGFR's persistently <60 mL/min signify possible Chronic Kidney Disease.    Anion gap 10 5 - 15  Ethanol     Status: Abnormal    Collection Time: 10/31/14  6:40 PM  Result Value Ref Range   Alcohol, Ethyl (B) 26 (H) <5 mg/dL    Comment:        LOWEST DETECTABLE LIMIT FOR SERUM ALCOHOL IS 5 mg/dL FOR MEDICAL PURPOSES ONLY   Urine rapid drug screen (hosp performed)  Status: None   Collection Time: 10/31/14  6:40 PM  Result Value Ref Range   Opiates NONE DETECTED NONE DETECTED   Cocaine NONE DETECTED NONE DETECTED   Benzodiazepines NONE DETECTED NONE DETECTED   Amphetamines NONE DETECTED NONE DETECTED   Tetrahydrocannabinol NONE DETECTED NONE DETECTED   Barbiturates NONE DETECTED NONE DETECTED    Comment:        DRUG SCREEN FOR MEDICAL PURPOSES ONLY.  IF CONFIRMATION IS NEEDED FOR ANY PURPOSE, NOTIFY LAB WITHIN 5 DAYS.        LOWEST DETECTABLE LIMITS FOR URINE DRUG SCREEN Drug Class       Cutoff (ng/mL) Amphetamine      1000 Barbiturate      200 Benzodiazepine   532 Tricyclics       992 Opiates          300 Cocaine          300 THC              50   Carbamazepine level, total     Status: Abnormal   Collection Time: 10/31/14  6:40 PM  Result Value Ref Range   Carbamazepine Lvl 14.4 (H) 4.0 - 12.0 ug/mL    Comment: Performed at [facility]  Urinalysis, Routine w reflex microscopic (not at [facility])     Status: None   Collection Time: 10/31/14  6:40 PM  Result Value Ref Range   Color, Urine YELLOW YELLOW   APPearance CLEAR CLEAR   Specific Gravity, Urine 1.007 1.005 - 1.030   pH 7.0 5.0 - 8.0   Glucose, UA NEGATIVE NEGATIVE mg/dL   Hgb urine dipstick NEGATIVE NEGATIVE   Bilirubin Urine NEGATIVE NEGATIVE   Ketones, ur NEGATIVE NEGATIVE mg/dL   Protein, ur NEGATIVE NEGATIVE mg/dL   Urobilinogen, UA 0.2 0.0 - 1.0 mg/dL   Nitrite NEGATIVE NEGATIVE   Leukocytes, UA NEGATIVE NEGATIVE    Comment: MICROSCOPIC NOT DONE ON URINES WITH NEGATIVE PROTEIN, BLOOD, LEUKOCYTES, NITRITE, OR GLUCOSE <1000 mg/dL.  CBC     Status: Abnormal   Collection Time: 11/01/14  5:50 AM  Result Value Ref Range   WBC  4.5 4.0 - 10.5 K/uL   RBC 3.48 (L) 4.22 - 5.81 MIL/uL   Hemoglobin 11.7 (L) 13.0 - 17.0 g/dL   HCT 33.1 (L) 39.0 - 52.0 %   MCV 95.1 78.0 - 100.0 fL   MCH 33.6 26.0 - 34.0 pg   MCHC 35.3 30.0 - 36.0 g/dL   RDW 12.5 11.5 - 15.5 %   Platelets 202 150 - 400 K/uL  Basic metabolic panel     Status: Abnormal   Collection Time: 11/01/14  5:50 AM  Result Value Ref Range   Sodium 129 (L) 135 - 145 mmol/L    Comment: DELTA CHECK NOTED   Potassium 3.7 3.5 - 5.1 mmol/L   Chloride 95 (L) 101 - 111 mmol/L   CO2 28 22 - 32 mmol/L   Glucose, Bld 94 65 - 99 mg/dL   BUN 6 6 - 20 mg/dL   Creatinine, Ser 0.60 (L) 0.61 - 1.24 mg/dL   Calcium 8.0 (L) 8.9 - 10.3 mg/dL   GFR calc non Af Amer >60 >60 mL/min   GFR calc Af Amer >60 >60 mL/min    Comment: (NOTE) The eGFR has been calculated using the CKD EPI equation. This calculation has not been validated in all clinical situations. eGFR's persistently <60 mL/min signify possible Chronic Kidney Disease.    Anion gap  6 5 - 15  Culture, blood (routine x 2)     Status: None (Preliminary result)   Collection Time: 11/01/14  2:03 PM  Result Value Ref Range   Specimen Description BLOOD RIGHT ANTECUBITAL    Special Requests BOTTLES DRAWN AEROBIC AND ANAEROBIC 10CC    Culture      NO GROWTH < 24 HOURS Performed at [facility]    Report Status PENDING   Culture, blood (routine x 2)     Status: None (Preliminary result)   Collection Time: 11/01/14  2:07 PM  Result Value Ref Range   Specimen Description BLOOD RIGHT FOREARM    Special Requests BOTTLES DRAWN AEROBIC AND ANAEROBIC 10CC    Culture      NO GROWTH < 24 HOURS Performed at [facility]    Report Status PENDING   Basic metabolic panel     Status: Abnormal   Collection Time: 11/02/14  7:16 AM  Result Value Ref Range   Sodium 135 135 - 145 mmol/L   Potassium 4.4 3.5 - 5.1 mmol/L   Chloride 102 101 - 111 mmol/L   CO2 26 22 - 32 mmol/L   Glucose, Bld 112 (H) 65 - 99 mg/dL    BUN 8 6 - 20 mg/dL   Creatinine, Ser 0.80 0.61 - 1.24 mg/dL   Calcium 8.5 (L) 8.9 - 10.3 mg/dL   GFR calc non Af Amer >60 >60 mL/min   GFR calc Af Amer >60 >60 mL/min    Comment: (NOTE) The eGFR has been calculated using the CKD EPI equation. This calculation has not been validated in all clinical situations. eGFR's persistently <60 mL/min signify possible Chronic Kidney Disease.    Anion gap 7 5 - 15  Carbamazepine level, total     Status: None   Collection Time: 11/02/14  1:34 PM  Result Value Ref Range   Carbamazepine Lvl 8.6 4.0 - 12.0 ug/mL    Comment: Performed at [facility]   _0 (sdes,specrequest,cult,reptstatus)   ) Recent Results (from the past 720 hour(s))  MRSA PCR Screening     Status: None   Collection Time: 10/08/14  4:46 PM  Result Value Ref Range Status   MRSA by PCR NEGATIVE NEGATIVE Final    Comment:        The GeneXpert MRSA Assay (FDA approved for NASAL specimens only), is one component of a comprehensive MRSA colonization surveillance program. It is not intended to diagnose MRSA infection nor to guide or monitor treatment for MRSA infections.   Urine culture     Status: None   Collection Time: 10/08/14  6:03 PM  Result Value Ref Range Status   Specimen Description URINE, CLEAN CATCH  Final   Special Requests NONE  Final   Culture   Final    MULTIPLE SPECIES PRESENT, SUGGEST RECOLLECTION Performed at [facility]    Report Status 10/10/2014 FINAL  Final  Urine culture     Status: None   Collection Time: 10/16/14 11:24 AM  Result Value Ref Range Status   Specimen Description URINE, RANDOM  Final   Special Requests NONE  Final   Culture   Final    NO GROWTH 1 DAY Performed at [facility]    Report Status 10/17/2014 FINAL  Final  Culture, blood (routine x 2)     Status: None   Collection Time: 10/16/14  4:15 PM  Result Value Ref Range Status   Specimen Description BLOOD RIGHT HAND  Final   Special  Requests BOTTLES DRAWN AEROBIC AND ANAEROBIC 10CC  Final   Culture   Final    NO GROWTH 5 DAYS Performed at [facility]    Report Status 10/21/2014 FINAL  Final  Culture, blood (routine x 2)     Status: None   Collection Time: 10/16/14  4:25 PM  Result Value Ref Range Status   Specimen Description BLOOD RIGHT HAND  Final   Special Requests BOTTLES DRAWN AEROBIC AND ANAEROBIC 10CC  Final   Culture  Setup Time   Final    GRAM POSITIVE COCCI IN CLUSTERS AEROBIC BOTTLE ONLY CRITICAL RESULT CALLED TO, READ BACK BY AND VERIFIED WITH: N DUMAS,RN AT 1519 10/17/14 BY L BENFIELD    Culture   Final    METHICILLIN RESISTANT STAPHYLOCOCCUS AUREUS Performed at [facility]    Report Status 10/19/2014 FINAL  Final   Organism ID, Bacteria METHICILLIN RESISTANT STAPHYLOCOCCUS AUREUS  Final      Susceptibility   Methicillin resistant staphylococcus aureus - MIC*    CIPROFLOXACIN >=8 RESISTANT Resistant     ERYTHROMYCIN >=8 RESISTANT Resistant     GENTAMICIN <=0.5 SENSITIVE Sensitive     OXACILLIN >=4 RESISTANT Resistant     TETRACYCLINE <=1 SENSITIVE Sensitive     VANCOMYCIN 1 SENSITIVE Sensitive     TRIMETH/SULFA <=10 SENSITIVE Sensitive     CLINDAMYCIN <=0.25 SENSITIVE Sensitive     RIFAMPIN <=0.5 SENSITIVE Sensitive     Inducible Clindamycin NEGATIVE Sensitive     * METHICILLIN RESISTANT STAPHYLOCOCCUS AUREUS  Culture, blood (routine x 2)     Status: None   Collection Time: 10/18/14  6:30 PM  Result Value Ref Range Status   Specimen Description BLOOD LEFT HAND  Final   Special Requests BOTTLES DRAWN AEROBIC ONLY 5CC  Final   Culture   Final    NO GROWTH 5 DAYS Performed at [facility]    Report Status 10/23/2014 FINAL  Final  Culture, blood (routine x 2)     Status: None   Collection Time: 10/18/14  6:35 PM  Result Value Ref Range Status   Specimen Description BLOOD RIGHT ANTECUBITAL  Final   Special Requests BOTTLES DRAWN AEROBIC ONLY [place]  Final   Culture    Final    NO GROWTH 5 DAYS Performed at [facility]    Report Status 10/23/2014 FINAL  Final  Culture, blood (routine x 2)     Status: None (Preliminary result)   Collection Time: 11/01/14  2:03 PM  Result Value Ref Range Status   Specimen Description BLOOD RIGHT ANTECUBITAL  Final   Special Requests BOTTLES DRAWN AEROBIC AND ANAEROBIC 10CC  Final   Culture   Final    NO GROWTH < 24 HOURS Performed at [facility]    Report Status PENDING  Incomplete  Culture, blood (routine x 2)     Status: None (Preliminary result)   Collection Time: 11/01/14  2:07 PM  Result Value Ref Range Status   Specimen Description BLOOD RIGHT FOREARM  Final   Special Requests BOTTLES DRAWN AEROBIC AND ANAEROBIC 10CC  Final   Culture   Final    NO GROWTH < 24 HOURS Performed at [facility]    Report Status PENDING  Incomplete     Impression/Recommendation  Principal Problem:   Hyponatremia Active Problems:   History of alcohol abuse   Seizure   [name] [name] is a 54 y.o. male with  Alcoholism,  chronic hepatitis c without coma, seizures, hyponatremia, MRSA bacteremia, falls with fracture and wound of left elbow  #1 MRSA bacteremia:  In his current state it is very difficult to evaluate him.  He seems quite paranoid when I was interviewing him and have a hard time seeing how he is going to sit through an MRI scan of the brain.  I would strongly recommend him being seen by psychiatrist to see if his mood can be stabilized and his competency assessed.  If he is stable and will comply with an MRI I would scan his brain with an MRI to ensure no brain abscess has developed from his MRSA bacteremia.  I will consult wound care with regards to his left elbow  If he were to be discharged to skilled nursing I will give him a six-week course of IV vancomycin.  If he were to be discharged to the outpatient world--and I am skeptical that he is competent to care for himself then  I would DC him with zyvox and try to treat him for 4-6 weeks with oral zyvox  IV ORITAVANCIN is another possibility to consider  #2 Paranoia: do not know what his baseline mental status is but I wonder if he suffers from a chronic encephalopathy and dementia.   I would consult psychiatry to assist in stabilizing the patient's paranoia and in assessing him for competency.  #2 Elbow: see above get WOC  #3 Screening: will rescreen for HIV, was negative in March  #4 HCV without coma: NOT a candidate for rx unless and [place] is a stable living condition., And is demonstrated that he can comply with medical therapy his current conditions are way too chaotic  Dr Megan Salon is on tomorrow.  11/02/2014, 5:05 PM   Thank you so much for this interesting consult  [place] [facility] [place] [phone] (pager) [phone] (office) 11/02/2014, 5:05 PM  Rhina [place] 11/02/2014, 5:05 PM

## 2014-11-02 NOTE — Progress Notes (Signed)
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 Patient signed out AMA on 8/1 9 as he did not want to miss his pay check and did not take any medications with him. He was then taking 400 mg of Tegretol 3 times a day at home. He presented to the ED on 8/12 with a fall secondary to possible seizure and hitting his left elbow. Patient was found to be hyponatremic with sodium of 122. Admitted to telemetry.  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 He has had issue of hyponatremia with tegretol and we had decided to increase his Keppra dose to 1500 mg twice a day and stop Tegretol during recent hospitalization. Patient however continued high-dose tegretol after signing out AMA. Continue fluid restriction. Reduced tegretol to 200 mg 3 times a day after discussing with neurohospitalist. Sodium level normalized. Will taper of tegretol and discharge on increased dose of keppra. -Continue Lyrica.  MRSA bacteremia  1/2 blood cx from 7/28. Was being treated with IV vancomycin and levaquin empirically during last admission but pt signed out AMA. Was planned on placing a PICC line. Patient has been refusing skilled nursing facility. He may not be compliant with IV antibiotic therapy. Spoke with  ID consult Dr. Algis Liming. Recommends repeat blood cultures and MRI of the brain to rule out brain abscess given bacteremia and recurrent seizures. 2-D echo done during last hospitalization was negative for any vegetation. -Started on empiric IV  vancomycin after cultures obtained. -We'll follow-up with ID recommendations.  Agitation and aggressive behavior Patient showing aggressive outburst towards the nursing staff and agitated at times. He has son and his behavior during previous hospitalization as well. Clinically appears to have capacity. No signs of alcohol withdrawal. I will request a psychiatry evaluation in the morning to assess capacity and for any further recommendations..  Seizure Disorder As outlined above. Head CT, cervical spine, CT chest, abdomen and pelvis unremarkable. X-ray of the left elbow shows continued fracture left ulna.  Alcohol abuse  Monitor on  CIWA  Left ulnar fracture S/ fall . Pain control with prn Vicodin. Patient is supposed to see orthopedics as outpatient.  DVT prophylaxis:   diet: Regular with fluid Restriction to1800cc daily  Code Status: full code Family Communication: none at bedside Disposition Plan: home once stable   Consultants:  ID  Procedures:  Head CT  MRI brian    Antibiotics:  IV vancomycin  HPI/Subjective: Seen and examined. Patient was agitated last night and abusive to the nurses again. Reports he was angry as he was not allowed to drink enough water. Sleepy this morning. Denies any symptoms  Objective: Filed Vitals:   11/02/14 0551  BP: 131/80  Pulse: 65  Temp: 97.7 F (36.5 C)  Resp: 20    Intake/Output Summary (Last 24 hours) at 11/02/14 1212 Last data filed at 11/02/14 0915  Gross per 24 hour  Intake   1120 ml  Output   1550 ml  Net   -430 ml   Filed Weights   10/31/14 2315  Weight: 88.6 kg (195 lb 5.2 oz)    Exam:   General: Middle aged male in no  acute distress,sleepy  HEENT:  moist oral mucosa  Chest: Clear to auscultation bilaterally  CVS: Normal S1 and S2, normal  GI: Soft, nondistended, nontender, bowel sounds present  Musculoskeletal: no edema  CNS: Alert and oriented, comprehends very slowly   Data  Reviewed: Basic Metabolic Panel:  Recent Labs Lab 10/31/14 1840 11/01/14 0550 11/02/14 0716  NA 122* 129* 135  K 3.8 3.7 4.4  CL 88* 95* 102  CO2 24 28 26   GLUCOSE 83 94 112*  BUN <5* 6 8  CREATININE 0.59* 0.60* 0.80  CALCIUM 8.3* 8.0* 8.5*   Liver Function Tests:  Recent Labs Lab 10/31/14 1840  AST 25  ALT 19  ALKPHOS 88  BILITOT 0.4  PROT 7.1  ALBUMIN 3.4*   No results for input(s): LIPASE, AMYLASE in the last 168 hours. No results for input(s): AMMONIA in the last 168 hours. CBC:  Recent Labs Lab 10/31/14 1840 11/01/14 0550  WBC 5.3 4.5  NEUTROABS 2.6  --   HGB 12.5* 11.7*  HCT 34.3* 33.1*  MCV 93.7 95.1  PLT 238 202   Cardiac Enzymes: No results for input(s): CKTOTAL, CKMB, CKMBINDEX, TROPONINI in the last 168 hours. BNP (last 3 results)  Recent Labs  10/14/14 1825  BNP 64.4    ProBNP (last 3 results) No results for input(s): PROBNP in the last 8760 hours.  CBG: No results for input(s): GLUCAP in the last 168 hours.  Recent Results (from the past 240 hour(s))  Culture, blood (routine x 2)     Status: None (Preliminary result)   Collection Time: 11/01/14  2:07 PM  Result Value Ref Range Status   Specimen Description BLOOD RIGHT FOREARM  Final   Special Requests   Final    BOTTLES DRAWN AEROBIC AND ANAEROBIC 10CC Performed at [facility]    Culture PENDING  Incomplete   Report Status PENDING  Incomplete     Studies: Dg Ribs Unilateral W/chest Right  10/31/2014   CLINICAL DATA:  Right-sided rib pain after fall at home.  EXAM: RIGHT RIBS AND CHEST - 3+ VIEW  COMPARISON:  October 16, 2014.  FINDINGS: Mildly displaced fractures are seen involving the posterior portions of the right tenth, eleventh and twelfth ribs. No pneumothorax or pleural effusion is noted.  IMPRESSION: Mildly displaced right tenth, eleventh and twelfth rib fractures. No acute cardiopulmonary abnormality is noted.   Electronically Signed   By: Lupita Raider, M.D.    On: 10/31/2014 19:48   Dg Pelvis 1-2 Views  10/31/2014   CLINICAL DATA:  Fall at home.  EXAM: PELVIS - 1-2 VIEW  COMPARISON:  None.  FINDINGS: There is no evidence of pelvic fracture or diastasis. No pelvic bone lesions are seen. Sacroiliac and hip joints appear normal.  IMPRESSION: Normal pelvis.   Electronically Signed   By: Lupita Raider, M.D.   On: 10/31/2014 19:53   Dg Elbow Complete Left  10/31/2014   CLINICAL DATA:  Acute left elbow pain after fall at home.  EXAM: LEFT ELBOW - COMPLETE 3+ VIEW  COMPARISON:  October 13, 2014.  FINDINGS: There is continued presence of nondisplaced fracture involving the proximal ulna with intra-articular extension. No abnormal fat pad displacement is noted. Visualized portions of radius and ulna appear normal. Mild degenerative changes are noted.  IMPRESSION: Continued presence of proximal ulnar fracture with intra-articular extension.   Electronically Signed   By: Lupita Raider, M.D.   On: 10/31/2014 19:51   Ct Head Wo Contrast  10/31/2014   CLINICAL DATA:  Unwitnessed fall  EXAM: CT HEAD WITHOUT CONTRAST  CT CERVICAL SPINE WITHOUT CONTRAST  TECHNIQUE: Multidetector CT imaging of the head and cervical spine was performed following the standard protocol without intravenous contrast. Multiplanar CT image reconstructions of the cervical spine were also generated.  COMPARISON:  10/08/2014, 08/22/14  FINDINGS: CT HEAD FINDINGS  Bony calvarium is intact. Diffuse atrophic changes are seen. There are findings consistent with encephalomalacia in the frontal areas bilaterally. This is stable from the prior exam. No acute hemorrhage or acute infarct is identified. No space-occupying mass lesion is noted.  CT CERVICAL SPINE FINDINGS  Seven cervical segments are well visualized. Vertebral body height is well maintained. Osteophytic changes are noted anteriorly at C4-5, C5-6 and C6-7. Mild facet hypertrophic changes are noted. No acute fracture or acute facet abnormality is seen. No  gross soft tissue abnormality is seen. Carotid calcifications are noted.  IMPRESSION: CT of the head: Chronic ischemic/traumatic changes without acute abnormality.  CT of the cervical spine: Degenerative change without acute abnormality.   Electronically Signed   By: Alcide Clever M.D.   On: 10/31/2014 21:07   Ct Chest W Contrast  10/31/2014   CLINICAL DATA:  Seizure, elbow pain, probable fall  EXAM: CT CHEST, ABDOMEN, AND PELVIS WITH CONTRAST  TECHNIQUE: Multidetector CT imaging of the chest, abdomen and pelvis was performed following the standard protocol during bolus administration of intravenous contrast.  CONTRAST:  1 OMNIPAQUE IOHEXOL 300 MG/ML  SOLN  COMPARISON:  10/11/2014  FINDINGS: CT CHEST FINDINGS  Sagittal images of the spine shows no acute fractures. Sagittal view of the sternum is unremarkable. No scapular fracture. No definite clavicle fracture.  Atherosclerotic calcifications of thoracic aorta. Heart size within normal limits. No pericardial effusion.  At least 3 or 4 healing fractures are noted in right posterior lower ribs. Healing fracture in left posterior rib noted in axial image 56. Three there is nondisplaced acute fracture of the right 12 rib. See axial image 65. Some pleural thickening noted in right posterior hemithorax.  There is no mediastinal hematoma or adenopathy. No pericardial effusion.  No lung contusion or pneumothorax. Mild atelectasis noted in right lower lobe posteriorly. There is no pneumothorax.  CT ABDOMEN AND PELVIS FINDINGS  Sagittal images of the lumbar spine shows significant disc space flattening with vacuum disc phenomenon at L4-L5 and L5-S1 level. Large anterior osteophytes and moderate disc space flattening at L3-L4 level. Atherosclerotic calcifications of abdominal aorta and iliac arteries. Enhanced liver is unremarkable. No calcified gallstones are noted within gallbladder. Enhanced pancreas, spleen and adrenal glands are unremarkable. Degenerative changes  bilateral SI joints. No sacral fracture is noted.  There is no evidence of urinary bladder injury. No pelvic fractures are noted. No inguinal adenopathy. Enhanced kidneys are symmetrical in size. No hydronephrosis or hydroureter. Delayed renal images shows bilateral renal symmetrical excretion.  No pericecal inflammation. The terminal ileum is unremarkable. Normal appendix.  Coronal images shows bilateral hip joint symmetrical in appearance. No hip fractures.  IMPRESSION: 1. There are bilateral posterior healing fractures as described above. Question tiny nondisplaced acute fracture of the right twelfth rib. 2. No lung contusion or pneumothorax. 3. No mediastinal hematoma or adenopathy. 4. Degenerative changes bilateral SI joints. Degenerative changes lumbar spine. 5. No acute visceral injury within abdomen or pelvis. 6. No pericecal inflammation.  Normal appendix. 7. No evidence of urinary bladder injury. 8. No acute fractures are noted within abdomen and pelvis. Degenerative changes lumbar spine. 9.  Electronically Signed   By: Natasha Mead M.D.   On: 10/31/2014 21:10   Ct Cervical Spine Wo Contrast  10/31/2014   CLINICAL DATA:  Unwitnessed fall  EXAM: CT HEAD WITHOUT CONTRAST  CT CERVICAL SPINE WITHOUT CONTRAST  TECHNIQUE: Multidetector CT imaging of the head and cervical spine was performed following the standard protocol without intravenous contrast. Multiplanar CT image reconstructions of the cervical spine were also generated.  COMPARISON:  10/08/2014, 08/22/14  FINDINGS: CT HEAD FINDINGS  Bony calvarium is intact. Diffuse atrophic changes are seen. There are findings consistent with encephalomalacia in the frontal areas bilaterally. This is stable from the prior exam. No acute hemorrhage or acute infarct is identified. No space-occupying mass lesion is noted.  CT CERVICAL SPINE FINDINGS  Seven cervical segments are well visualized. Vertebral body height is well maintained. Osteophytic changes are noted  anteriorly at C4-5, C5-6 and C6-7. Mild facet hypertrophic changes are noted. No acute fracture or acute facet abnormality is seen. No gross soft tissue abnormality is seen. Carotid calcifications are noted.  IMPRESSION: CT of the head: Chronic ischemic/traumatic changes without acute abnormality.  CT of the cervical spine: Degenerative change without acute abnormality.   Electronically Signed   By: Alcide Clever M.D.   On: 10/31/2014 21:07   Ct Abdomen Pelvis W Contrast  10/31/2014   CLINICAL DATA:  Seizure, elbow pain, probable fall  EXAM: CT CHEST, ABDOMEN, AND PELVIS WITH CONTRAST  TECHNIQUE: Multidetector CT imaging of the chest, abdomen and pelvis was performed following the standard protocol during bolus administration of intravenous contrast.  CONTRAST:  1 OMNIPAQUE IOHEXOL 300 MG/ML  SOLN  COMPARISON:  10/11/2014  FINDINGS: CT CHEST FINDINGS  Sagittal images of the spine shows no acute fractures. Sagittal view of the sternum is unremarkable. No scapular fracture. No definite clavicle fracture.  Atherosclerotic calcifications of thoracic aorta. Heart size within normal limits. No pericardial effusion.  At least 3 or 4 healing fractures are noted in right posterior lower ribs. Healing fracture in left posterior rib noted in axial image 56. Three there is nondisplaced acute fracture of the right 12 rib. See axial image 65. Some pleural thickening noted in right posterior hemithorax.  There is no mediastinal hematoma or adenopathy. No pericardial effusion.  No lung contusion or pneumothorax. Mild atelectasis noted in right lower lobe posteriorly. There is no pneumothorax.  CT ABDOMEN AND PELVIS FINDINGS  Sagittal images of the lumbar spine shows significant disc space flattening with vacuum disc phenomenon at L4-L5 and L5-S1 level. Large anterior osteophytes and moderate disc space flattening at L3-L4 level. Atherosclerotic calcifications of abdominal aorta and iliac arteries. Enhanced liver is unremarkable.  No calcified gallstones are noted within gallbladder. Enhanced pancreas, spleen and adrenal glands are unremarkable. Degenerative changes bilateral SI joints. No sacral fracture is noted.  There is no evidence of urinary bladder injury. No pelvic fractures are noted. No inguinal adenopathy. Enhanced kidneys are symmetrical in size. No hydronephrosis or hydroureter. Delayed renal images shows bilateral renal symmetrical excretion.  No pericecal inflammation. The terminal ileum is unremarkable. Normal appendix.  Coronal images shows bilateral hip joint symmetrical in appearance. No hip fractures.  IMPRESSION: 1. There are bilateral posterior healing fractures as described above. Question tiny nondisplaced acute fracture of the right twelfth rib. 2. No lung contusion or pneumothorax. 3. No mediastinal hematoma or adenopathy. 4. Degenerative changes bilateral SI joints. Degenerative changes lumbar spine. 5. No acute visceral injury within abdomen or pelvis. 6. No pericecal inflammation.  Normal appendix.  7. No evidence of urinary bladder injury. 8. No acute fractures are noted within abdomen and pelvis. Degenerative changes lumbar spine. 9.   Electronically Signed   By: Natasha Mead M.D.   On: 10/31/2014 21:10    Scheduled Meds: . carbamazepine  200 mg Oral TID  . folic acid  1 mg Oral Daily  . heparin  5,000 Units Subcutaneous 3 times per day  . levETIRAcetam  1,500 mg Oral BID  . LORazepam  2 mg Intravenous Once  . metoprolol  25 mg Oral BID  . multivitamin with minerals  1 tablet Oral Daily  . pregabalin  100 mg Oral BID AC & HS  . pregabalin  50 mg Oral QAC lunch  . sodium chloride  3 mL Intravenous Q12H  . thiamine  100 mg Oral Daily   Or  . thiamine  100 mg Intravenous Daily  . vancomycin  1,250 mg Intravenous Q8H   Continuous Infusions:     Time spent: 25 minutes    Braxtin Bamba  [facility] Pager [phone]. If 7PM-7AM, please contact night-coverage at [email or web address], password  [facility] 11/02/2014, 12:12 PM  LOS: 2 days

## 2014-11-02 NOTE — Progress Notes (Signed)
At beginning of the shift, patient was belligerent. Took off his telemetry. Arguing about everything with the RN. Being noncompliant with fluid restrictions. States he wants to go to [employer]. PA on call was notified and she came up to speak with the patient. Also, gave orders for a one time dose of Ativan. Once Ativan and pain medicine was given, patient fell asleep.

## 2014-11-02 NOTE — Progress Notes (Signed)
Pt requested Lt elbow to be wrapped,Kerlex and ace wrap placed.

## 2014-11-03 ENCOUNTER — Inpatient Hospital Stay (HOSPITAL_COMMUNITY): Payer: Medicaid Other

## 2014-11-03 DIAGNOSIS — F0391 Unspecified dementia with behavioral disturbance: Secondary | ICD-10-CM

## 2014-11-03 DIAGNOSIS — F4325 Adjustment disorder with mixed disturbance of emotions and conduct: Secondary | ICD-10-CM

## 2014-11-03 DIAGNOSIS — Z8614 Personal history of Methicillin resistant Staphylococcus aureus infection: Secondary | ICD-10-CM

## 2014-11-03 LAB — RPR: RPR Ser Ql: NONREACTIVE

## 2014-11-03 LAB — HIV ANTIBODY (ROUTINE TESTING W REFLEX): HIV SCREEN 4TH GENERATION: NONREACTIVE

## 2014-11-03 MED ORDER — LORAZEPAM 2 MG/ML IJ SOLN
2.0000 mg | Freq: Once | INTRAMUSCULAR | Status: DC | PRN
  Filled 2014-11-03 (×2): qty 1

## 2014-11-03 MED ORDER — LORAZEPAM 1 MG PO TABS: 1.0000 mg | ORAL_TABLET | Freq: Four times a day (QID) | ORAL | Status: AC | PRN

## 2014-11-03 MED ORDER — LORAZEPAM 2 MG/ML IJ SOLN
1.0000 mg | Freq: Four times a day (QID) | INTRAMUSCULAR | Status: AC | PRN
  Administered 2014-11-04 (×2): 1 mg via INTRAVENOUS
  Filled 2014-11-03 (×2): qty 1

## 2014-11-03 MED ORDER — KETOROLAC TROMETHAMINE 30 MG/ML IJ SOLN
30.0000 mg | Freq: Once | INTRAMUSCULAR | Status: AC
  Administered 2014-11-03: 30 mg via INTRAVENOUS
  Filled 2014-11-03: qty 1

## 2014-11-03 NOTE — Progress Notes (Signed)
One time dose of Ativan given because patient was agitated. Order for Ativan was to be given prior to MRI but Dr. Gonzella Lex was verbally made aware and said it was ok to give ativan now. Will continue to monitor. Julio Sicks RN

## 2014-11-03 NOTE — Progress Notes (Signed)
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 Patient signed out AMA on 8/1 9 as he did not want to miss his pay check and did not take any medications with him. He was then taking 400 mg of Tegretol 3 times a day at home. He presented to the ED on 8/12 with a fall secondary to possible seizure and hitting his left elbow. Patient was found to be hyponatremic with sodium of 122. Admitted to telemetry.  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 He has had issue of hyponatremia with tegretol and we had decided to increase his Keppra dose to 1500 mg twice a day and stop Tegretol during recent hospitalization. Patient however continued high-dose tegretol after signing out AMA. Continue fluid restriction. Reduced tegretol to 200 mg 3 times a day after discussing with neurohospitalist. Sodium level normalized. Will taper of tegretol and discharge on increased dose of keppra. -Continue Lyrica.  MRSA bacteremia  1/2 blood cx from 7/28. Was being treated with IV vancomycin and levaquin empirically during last admission but pt signed out AMA.   2-D echo done during last hospitalization was negative for any vegetation. -Started on empiric IV vancomycin after cultures obtained. cx so far negative, -no mental status changes. Head CT on admission negative. D/w ID today. May not need brain imaging. Radiology also had concern about his aneurysm clipping. As per pt it was done in 2005 at [facility] in [place].   -patient may not be compliant to IV abx with PICC line. His home situation is extremely poor. If affordable we could try zyvox for 2 weeks.  Agitation and aggressive behavior Patient showing aggressive outburst towards the nursing staff and agitated at times. Reports showed agitated behavior  during previous hospitalization as well. Clinically appears to have capacity. No signs of alcohol withdrawal.  Psych consulted  Seizure Disorder As outlined above. Head CT, cervical spine, CT chest, abdomen and pelvis unremarkable. X-ray of the left elbow shows continued fracture left ulna. Would care consulted for left elbow wound/  Alcohol abuse  Monitor on  CIWA  Left ulnar fracture S/ fall . Pain control with prn Vicodin. Patient is supposed to see orthopedics as outpatient.  DVT prophylaxis:   diet: Regular   Code Status: full code Family Communication: none at bedside Disposition Plan: home possibly yin 1-2 days   Consultants:  ID  Procedures:  Head CT  MRI brian    Antibiotics:  IV vancomycin  HPI/Subjective: Seen and examined. Sleepy today after he got 2 mg IV ativan accidentally ( was ordered to be given prior to MRI brain but received it in the morning instead). arousable and answering few questions  Objective: Filed Vitals:   11/02/14 2302  BP: 133/82  Pulse: 70  Temp: 98.7 F (37.1 C)  Resp: 18    Intake/Output Summary (Last 24 hours) at 11/03/14 1400 Last data filed at 11/03/14 1300  Gross per 24 hour  Intake   1340 ml  Output    450 ml  Net    890 ml   Filed Weights   10/31/14 2315  Weight: 88.6 kg (195 lb  5.2 oz)    Exam:   General: sleepy but arousable  Chest: Clear to auscultation bilaterally  CVS: Normal S1 and S2, normal  GI: Soft, nondistended, nontender,  Musculoskeletal: no edema  CNS: sleepy   Data Reviewed: Basic Metabolic Panel:  Recent Labs Lab 10/31/14 1840 11/01/14 0550 11/02/14 0716  NA 122* 129* 135  K 3.8  3.7 4.4  CL 88* 95* 102  CO2 24 28 26   GLUCOSE 83 94 112*  BUN <5* 6 8  CREATININE 0.59* 0.60* 0.80  CALCIUM 8.3* 8.0* 8.5*   Liver Function Tests:  Recent Labs Lab 10/31/14 1840  AST 25  ALT 19  ALKPHOS 88  BILITOT 0.4  PROT 7.1  ALBUMIN 3.4*   No results for input(s): LIPASE, AMYLASE in the last 168 hours. No results for input(s): AMMONIA in the last 168 hours. CBC:  Recent Labs Lab 10/31/14 1840 11/01/14 0550  WBC 5.3 4.5  NEUTROABS 2.6  --   HGB 12.5* 11.7*  HCT 34.3* 33.1*  MCV 93.7 95.1  PLT 238 202   Cardiac Enzymes: No results for input(s): CKTOTAL, CKMB, CKMBINDEX, TROPONINI in the last 168 hours. BNP (last 3 results)  Recent Labs  10/14/14 1825  BNP 64.4    ProBNP (last 3 results) No results for input(s): PROBNP in the last 8760 hours.  CBG: No results for input(s): GLUCAP in the last 168 hours.  Recent Results (from the past 240 hour(s))  Culture, blood (routine x 2)     Status: None (Preliminary result)   Collection Time: 11/01/14  2:03 PM  Result Value Ref Range Status   Specimen Description BLOOD RIGHT ANTECUBITAL  Final   Special Requests BOTTLES DRAWN AEROBIC AND ANAEROBIC 10CC  Final   Culture   Final    NO GROWTH < 24 HOURS Performed at [facility]    Report Status PENDING  Incomplete  Culture, blood (routine x 2)     Status: None (Preliminary result)   Collection Time: 11/01/14  2:07 PM  Result Value Ref Range Status   Specimen Description BLOOD RIGHT FOREARM  Final   Special Requests BOTTLES DRAWN AEROBIC AND ANAEROBIC 10CC  Final   Culture   Final    NO GROWTH < 24 HOURS Performed at [facility]    Report Status PENDING  Incomplete     Studies: No results found.  Scheduled Meds: . carbamazepine  200 mg Oral TID  . folic acid  1 mg Oral Daily  . heparin  5,000 Units Subcutaneous 3 times per day  . levETIRAcetam  1,500 mg Oral BID  . metoprolol  25 mg Oral BID  . multivitamin with minerals  1 tablet  Oral Daily  . nicotine  21 mg Transdermal Daily  . pregabalin  100 mg Oral BID AC & HS  . pregabalin  50 mg Oral QAC lunch  . sodium chloride  3 mL Intravenous Q12H  . thiamine  100 mg Oral Daily  . vancomycin  1,250 mg Intravenous Q8H   Continuous Infusions:     Time spent: 25 minutes    Estefanny Moler  [facility] Pager [phone]. If 7PM-7AM, please contact night-coverage at [email or web address], password [facility] 11/03/2014, 2:00 PM  LOS: 3 days

## 2014-11-03 NOTE — Assessment & Plan Note (Signed)
He had transient MRSA bacteremia during his recent hospitalization in late July. Repeat blood cultures during this admission are negative at 48 hours. I will continue vancomycin.

## 2014-11-03 NOTE — Progress Notes (Signed)
MRI called and said B4 they can do pt's MRI, they need to know the year his aneurysm clip was done, if he has a "card", and what hospital it was done at. The notes only say [facility] , and there a several listed on internet.  Only contact listed is a friend. Pt unable to tell me at this time. What should be done?

## 2014-11-03 NOTE — Consult Note (Signed)
WOC wound consult note Reason for Consult: Fall with injury to left elbow.  Wound type:Trauma Pressure Ulcer POA: N/A Measurement: 4 cm x 1.5 cm x 0.2 cm Wound MVH:QION pink Drainage (amount, consistency, odor) None noted.  Periwound:Intact Dressing procedure/placement/frequency:Cleanse left elbow with NS and pat gently dry.  Apply small piece Aquacel Ag to wound bed.  Cover with 4x4 gauze and kerlix.  Wrap with ace wrap per patient preference.  Change daily.  Re-use ace wrap unless soiled.  Conservative sharp wound debridement (CSWD performed at the bedside):

## 2014-11-03 NOTE — Consult Note (Signed)
[facility] Face-to-Face Psychiatry Consult   Reason for Consult:  Capacity evaluation, seizure disorder and substance abuse Referring Physician:  Dr. Clementeen Graham Patient Identification: [name] [name] MRN:  [id number] Principal Diagnosis: Hyponatremia Diagnosis:   Patient Active Problem List   Diagnosis Date Noted  . Bacteremia [R78.81]   . Fall [W19.XXXA]   . MRSA bacteremia [R78.81, B95.62]   . Paranoia [F22]   . Dementia with behavioral disturbance [F03.91]   . Seizure [R56.9] 10/31/2014  . Staphylococcus aureus bacteremia without sepsis [A49.01] 10/18/2014  . Altered mental status [R41.82]   . 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 Left elbow pain [M25.522] 10/13/2014  . Ulnar fracture [S52.209A] 10/13/2014  . Encephalopathy, hepatic [K72.90] 10/09/2014  . Tobacco dependency [F17.200] 10/08/2014  . History of rib fracture [Z87.81] 10/08/2014  . Hereditary and idiopathic peripheral neuropathy [G60.9] 06/03/2014  . Seizures [R56.9] 01/24/2014  . Bilateral leg weakness [R29.898] 01/23/2014  . 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 Hyponatremia [E87.1] 11/01/2012  . Convulsions/seizures [R56.9] 10/31/2012  . Essential hypertension, benign [I10] 10/31/2012  . Septic joint of left elbow [M00.9] 10/26/2012  . History of alcohol abuse [F10.10] 10/26/2012  . Chronic liver disease [K76.9] 10/26/2012  . Thrombocytopenia [D69.6] 10/26/2012  . Chronic hepatitis C without hepatic coma [B18.2] 10/26/2012    Total Time spent with patient: 1 hour  Subjective:   [name] [name] is a 54 y.o. male patient admitted with hyponatremia.  HPI:  [name] [name] is a 54 year old male seen face to face for psych consult for increased symptoms irritability, agitation and needed capacity evaluation due to signed off AMA few days ago when admitted for status post seizure episodes. He has no witnessed seizure while in hospital. Patient hyponatremia has been improving and his medication  has been switching to avoid future hyponatremia. Patient stated that he does not get along with people in general and likes to stay himself and asking low income housing as he was on disability and does not like his current place where he is living for the past two years. He has been in contact with his son and his home health nurses. He has mild cognitive deficits and slow in thought process but no sign and symptoms of depression, anxiety and psychotic hallucinations. He is calm and cooperative with my evaluation.    HPI Elements:  Location:  agitation. Quality:  fair. Severity:   chronic with exacerbation of seizure. Timing:  unknown. Duration:  few weeks. Context:  psychosocial stresses.  Past Medical History:  Past Medical History  Diagnosis Date  . Seizure   . ETOHism   . Aneurysm   . Hypertension   . Hepatitis C   . Hereditary and idiopathic peripheral neuropathy 06/03/2014    Past Surgical History  Procedure Laterality Date  . Cerebral aneurysm repair      At [facility]  . I&d extremity Left 10/26/2012    Procedure: IRRIGATION AND DEBRIDEMENT Left Elbow;  Surgeon: Renette Butters, MD;  Location: [place];  Service: Orthopedics;  Laterality: Left;  . I&d extremity Left 10/29/2012    Procedure: IRRIGATION AND DEBRIDEMENT EXTREMITY, wound vac change, stimulan beads;  Surgeon: Renette Butters, MD;  Location: [place];  Service: Orthopedics;  Laterality: Left;  lateral on bean bag  . Hardware removal Left 10/29/2012    Procedure: HARDWARE REMOVAL;  Surgeon: Renette Butters, MD;  Location: [place];  Service: Orthopedics;  Laterality: Left;  . Incision and drainage abscess Left 11/02/2012  Procedure: INCISION AND DRAINAGE ABSCESS;  Surgeon: Renette Butters, MD;  Location: [place];  Service: Orthopedics;  Laterality: Left;  . I&d extremity Left 11/02/2012    Procedure: IRRIGATION AND DEBRIDEMENT EXTREMITY;  Surgeon: Renette Butters, MD;  Location: [place];  Service: Orthopedics;  Laterality:  Left;   Family History:  Family History  Problem Relation Age of Onset  . Hypertension Mother   . Cancer Mother   . Hypertension Father   . Diabetes Sister   . Cancer Sister   . Cancer Sister    Social History:  History  Alcohol Use No    Comment: quit drinking 11/2011 per patient     History  Drug Use No    Social History   Social History  . Marital Status: Single    Spouse Name: N/A  . Number of Children: 3  . Years of Education: 11 th   Occupational History  . disabled    Social History Main Topics  . Smoking status: Former Smoker    Types: Cigarettes  . Smokeless tobacco: Never Used  . Alcohol Use: No     Comment: quit drinking 11/2011 per patient  . Drug Use: No  . Sexual Activity: Not Currently   Other Topics Concern  . None   Social History Narrative   Patient is single and lives at home alone.   Disabled.   Education 11 th grade.   Right handed.   Caffeine none .   Additional Social History:                          Allergies:  No Known Allergies  Labs:  Results for orders placed or performed during the hospital encounter of 10/31/14 (from the past 48 hour(s))  Culture, blood (routine x 2)     Status: None (Preliminary result)   Collection Time: 11/01/14  2:03 PM  Result Value Ref Range   Specimen Description BLOOD RIGHT ANTECUBITAL    Special Requests BOTTLES DRAWN AEROBIC AND ANAEROBIC 10CC    Culture      NO GROWTH < 24 HOURS Performed at [facility]    Report Status PENDING   Culture, blood (routine x 2)     Status: None (Preliminary result)   Collection Time: 11/01/14  2:07 PM  Result Value Ref Range   Specimen Description BLOOD RIGHT FOREARM    Special Requests BOTTLES DRAWN AEROBIC AND ANAEROBIC 10CC    Culture      NO GROWTH < 24 HOURS Performed at [facility]    Report Status PENDING   Basic metabolic panel     Status: Abnormal   Collection Time: 11/02/14  7:16 AM  Result Value Ref Range   Sodium  135 135 - 145 mmol/L   Potassium 4.4 3.5 - 5.1 mmol/L   Chloride 102 101 - 111 mmol/L   CO2 26 22 - 32 mmol/L   Glucose, Bld 112 (H) 65 - 99 mg/dL   BUN 8 6 - 20 mg/dL   Creatinine, Ser 0.80 0.61 - 1.24 mg/dL   Calcium 8.5 (L) 8.9 - 10.3 mg/dL   GFR calc non Af Amer >60 >60 mL/min   GFR calc Af Amer >60 >60 mL/min    Comment: (NOTE) The eGFR has been calculated using the CKD EPI equation. This calculation has not been validated in all clinical situations. eGFR's persistently <60 mL/min signify possible Chronic Kidney Disease.    Anion  gap 7 5 - 15  Carbamazepine level, total     Status: None   Collection Time: 11/02/14  1:34 PM  Result Value Ref Range   Carbamazepine Lvl 8.6 4.0 - 12.0 ug/mL    Comment: Performed at [facility]: Blood pressure 133/82, pulse 70, temperature 98.7 F (37.1 C), temperature source Oral, resp. rate 18, height 6' 6" (1.981 m), weight 88.6 kg (195 lb 5.2 oz), SpO2 99 %.  Risk to Self: Is patient at risk for suicide?: No Risk to Others:   Prior Inpatient Therapy:   Prior Outpatient Therapy:    Current Facility-Administered Medications  Medication Dose Route Frequency Provider Last Rate Last Dose  . carbamazepine (TEGRETOL) tablet 200 mg  200 mg Oral TID Etta Quill, DO   200 mg at 11/02/14 2306  . folic acid (FOLVITE) tablet 1 mg  1 mg Oral Daily Etta Quill, DO   1 mg at 11/02/14 0931  . heparin injection 5,000 Units  5,000 Units Subcutaneous 3 times per day Etta Quill, DO   5,000 Units at 11/03/14 0506  . HYDROcodone-acetaminophen (NORCO/VICODIN) 5-325 MG per tablet 2 tablet  2 tablet Oral Q6H PRN Louellen Molder, MD   2 tablet at 11/03/14 0507  . levETIRAcetam (KEPPRA) tablet 1,500 mg  1,500 mg Oral BID Etta Quill, DO   1,500 mg at 11/02/14 2200  . LORazepam (ATIVAN) tablet 1 mg  1 mg Oral Q6H PRN Etta Quill, DO   1 mg at 11/02/14 2306   Or  . LORazepam (ATIVAN) injection 1 mg  1 mg Intravenous Q6H PRN Etta Quill, DO   1 mg at 11/02/14 7681  . LORazepam (ATIVAN) injection 2 mg  2 mg Intravenous Once PRN Jeryl Columbia, NP   2 mg at 11/03/14 0810  . metoprolol tartrate (LOPRESSOR) tablet 25 mg  25 mg Oral BID Etta Quill, DO   25 mg at 11/02/14 2201  . multivitamin with minerals tablet 1 tablet  1 tablet Oral Daily Etta Quill, DO   1 tablet at 11/02/14 0931  . nicotine (NICODERM CQ - dosed in mg/24 hours) patch 21 mg  21 mg Transdermal Daily Rhetta Mura Schorr, NP   21 mg at 11/02/14 2114  . pregabalin (LYRICA) capsule 100 mg  100 mg Oral BID AC & HS Etta Quill, DO   100 mg at 11/03/14 0808  . pregabalin (LYRICA) capsule 50 mg  50 mg Oral QAC lunch Etta Quill, DO   50 mg at 11/02/14 1238  . sodium chloride 0.9 % injection 3 mL  3 mL Intravenous Q12H Etta Quill, DO   3 mL at 11/01/14 2200  . thiamine (VITAMIN B-1) tablet 100 mg  100 mg Oral Daily Etta Quill, DO   100 mg at 11/02/14 0931  . vancomycin (VANCOCIN) 1,250 mg in sodium chloride 0.9 % 250 mL IVPB  1,250 mg Intravenous [address] [place],    1,250 mg at 11/03/14 0807    Musculoskeletal: Strength & Muscle Tone: decreased Gait & Station: normal Patient leans: N/A  Psychiatric Specialty Exam: Physical Exam as per history and physical  ROS memory deficits, denied nausea, vomiting, SOB and chest pain  Blood pressure 133/82, pulse 70, temperature 98.7 F (37.1 C), temperature source Oral, resp. rate 18, height 6' 6" (1.981 m), weight 88.6 kg (195 lb 5.2 oz), SpO2 99 %.Body mass index is 22.58 kg/(m^2).  General Appearance: Casual  Eye Contact::  Good  Speech:  Blocked and Clear and Coherent  Volume:  Normal  Mood:  Euthymic  Affect:  Constricted and Depressed  Thought Process:  Coherent and Goal Directed  Orientation:  Full (Time, Place, and Person)  Thought Content:  WDL  Suicidal Thoughts:  No  Homicidal Thoughts:  No  Memory:  Immediate;   Fair Recent;   Fair  Judgement:  Intact  Insight:   Fair  Psychomotor Activity:  Decreased  Concentration:  Fair  Recall:  [employer] of Knowledge:Good  Language: Good  Akathisia:  Negative  Handed:  Right  AIMS (if indicated):     Assets:  Communication Skills Desire for Improvement Financial Resources/Insurance Housing Leisure Time Resilience Social Support Talents/Skills Transportation  ADL's:  Intact  Cognition: Impaired,  Mild  Sleep:      Medical Decision Making: New problem, with additional work up planned, Review of Psycho-Social Stressors (1), Review or order clinical lab tests (1), Review of Last Therapy Session (1), Review or order medicine tests (1), Review of Medication Regimen & Side Effects (2) and Review of New Medication or Change in Dosage (2)  Treatment Plan Summary: Daily contact with patient to assess and evaluate symptoms and progress in treatment and Medication management  Plan:  Patient has capacity to make his own medical decisions and living arrangement Recommened no psych medications Patient does not meet criteria for psychiatric inpatient admission. Supportive therapy provided about ongoing stressors.  Appreciate psychiatric consultation Please contact [phone] or [phone] if needs further assistance   Disposition: Refer to out patient medication management when medically stable  ,JANARDHAHA R. 11/03/2014 10:40 AM

## 2014-11-03 NOTE — Clinical Social Work Psych Assess (Signed)
Clinical Social Work Nature conservation officer  Clinical Social Worker:  Boone Master, [place] Date/Time:  11/03/2014, 3:41 PM Referred By:  Physician Date Referred:  11/03/14 Reason for Referral:  Behavioral Health Issues   Presenting Symptoms/Problems  Presenting Symptoms/Problems(in person's/family's own words):  Patient reports he had a seizure which required him to come to the ho   Abuse/Neglect/Trauma History  Abuse/Neglect/Trauma History:  Denies History Abuse/Neglect/Trauma History Comments (indicate dates):  N/A   Psychiatric History  Psychiatric History:  Denies History Psychiatric Medication:  N/A   Current Mental Health Hospitalizations/Previous Mental Health History:  Patient denies any MH history.   Current Provider:  None Place and Date:  N/A  Current Medications:   Scheduled Meds: . carbamazepine  200 mg Oral TID  . folic acid  1 mg Oral Daily  . heparin  5,000 Units Subcutaneous 3 times per day  . levETIRAcetam  1,500 mg Oral BID  . metoprolol  25 mg Oral BID  . multivitamin with minerals  1 tablet Oral Daily  . nicotine  21 mg Transdermal Daily  . pregabalin  100 mg Oral BID AC & HS  . pregabalin  50 mg Oral QAC lunch  . sodium chloride  3 mL Intravenous Q12H  . thiamine  100 mg Oral Daily  . vancomycin  1,250 mg Intravenous Q8H   Continuous Infusions:  PRN Meds:.HYDROcodone-acetaminophen, LORazepam **OR** LORazepam, LORazepam     Previous Inpatient Admission/Date/Reason:  None reported   Emotional Health/Current Symptoms  Suicide/Self Harm: None Reported Suicide Attempt in Past (date/description):  Patient denies any SI or HI and contracts for safety.  Other Harmful Behavior (ex. homicidal ideation) (describe):  None reported   Psychotic/Dissociative Symptoms  Psychotic/Dissociative Symptoms: None Reported Other Psychotic/Dissociative Symptoms:  N/A   Attention/Behavioral Symptoms  Attention/Behavioral Symptoms:  Restless Other Attention/Behavioral Symptoms:  Patient is slow to respond at times.   Cognitive Impairment  Cognitive Impairment:  Within Normal Limits Other Cognitive Impairment:  Patient alert and oriented.   Mood and Adjustment  Mood and Adjustment:  Flat   Stress, Anxiety, Trauma, Any Recent Loss/Stressor  Stress, Anxiety, Trauma, Any Recent Loss/Stressor: None Reported Anxiety (frequency):  N/A  Phobia (specify):  N/A  Compulsive Behavior (specify):  N/A  Obsessive Behavior (specify):  N/A  Other Stress, Anxiety, Trauma, Any Recent Loss/Stressor:  N/A   Substance Abuse/Use  Substance Abuse/Use: None SBIRT Completed (please refer for detailed history): No Self-reported Substance Use (last use and frequency):  Patient denies any substance use. Per chart review, patient uses alcohol and had level of 28 BAL on arrival.   Urinary Drug Screen Completed: Yes Alcohol Level:  28   Environment/Housing/Living Arrangement  Environmental/Housing/Living Arrangement: Stable Housing Who is in the Home:  Roommate  Emergency Contact:  Fox-son   Financial  Financial: Medicaid   Patient's Strengths and Goals  Patient's Strengths and Goals (patient's own words):  Patient reports he has professional help that comes to his house and assists him as needed.   Clinical Social Worker's Interpretive Summary  Clinical Social Workers Interpretive Summary:  CSW received referral to complete psychosocial assessment. CSW reviewed chart and met with patient at bedside with psych MD.   Patient reports he lives in an apartment with a roommate. Patient had been homeless but worked with a program through [facility] who was able to find him an apartment to rent with another man. Patient does not like sharing his living space and wants to live alone. Patient reports he  has hired help that assists him with cleaning, grocery shopping, etc. Patient has a son but reports he is  often busy and unable to come visit often.  Patient denies any MH or SA problems. Per chart review, patient has a history of alcohol use but denies any current use. Patient had positive screening on admission.   CSW will continue to follow to assist with any recommendations provided by psych MD.   Disposition  Disposition: Recommend Psych CSW Continuing To Support While In [facility] [place]

## 2014-11-03 NOTE — Progress Notes (Signed)
Pt upset stating he had just had a seizure and no one came to help him. He said the sitter just watched him having it. He was angry at the sitter. I told pt that I was in another pt's room and no one told me about his seizure. I assured pt that we would watch him more closely so we could know when he was seizing. Pt was agitated, so I gave him Ativan 1 mg po per the CIWA scale. VS were stable after this seizure, and pt was alert and oriented. Approx. 30 min later I was called into room because NT told me he was having a seizure. Pt was in his bed with all his muscles stiffed, starring off to the left. He started moaning in pain. I notified provider on call and made her aware of the situation, and obtained a prn Ativan order if pt where to have another one. After a few minutes, pt returned to normal and c/o pain in his left arm that had MRSA. I obtained order for Toradol.  Will continue to monitor.

## 2014-11-03 NOTE — Progress Notes (Addendum)
Patient ID: [name] [name], male   DOB: 1960/11/29, 54 y.o.   MRN: [id number]         [facility]    Date of Admission:  10/31/2014           Day 2 vancomycin  Principal Problem:   Hyponatremia Active Problems:   History of alcohol abuse   Seizure   Bacteremia   Fall   MRSA bacteremia   Paranoia   Dementia with behavioral disturbance   . carbamazepine  200 mg Oral TID  . folic acid  1 mg Oral Daily  . heparin  5,000 Units Subcutaneous 3 times per day  . levETIRAcetam  1,500 mg Oral BID  . metoprolol  25 mg Oral BID  . multivitamin with minerals  1 tablet Oral Daily  . nicotine  21 mg Transdermal Daily  . pregabalin  100 mg Oral BID AC & HS  . pregabalin  50 mg Oral QAC lunch  . sodium chloride  3 mL Intravenous Q12H  . thiamine  100 mg Oral Daily  . vancomycin  1,250 mg Intravenous Q8H   Review of Systems: Review of systems not obtained due to patient factors.  Past Medical History  Diagnosis Date  . Seizure   . ETOHism   . Aneurysm   . Hypertension   . Hepatitis C   . Hereditary and idiopathic peripheral neuropathy 06/03/2014    Social History  Substance Use Topics  . Smoking status: Former Smoker    Types: Cigarettes  . Smokeless tobacco: Never Used  . Alcohol Use: No     Comment: quit drinking 11/2011 per patient    Family History  Problem Relation Age of Onset  . Hypertension Mother   . Cancer Mother   . Hypertension Father   . Diabetes Sister   . Cancer Sister   . Cancer Sister    No Known Allergies  OBJECTIVE: Filed Vitals:   11/02/14 0332 11/02/14 0551 11/02/14 1322 11/02/14 2302  BP: 156/77 131/80 145/87 133/82  Pulse: 64 65 71 70  Temp: 97.7 F (36.5 C) 97.7 F (36.5 C) 97.9 F (36.6 C) 98.7 F (37.1 C)  TempSrc: Oral Oral Oral Oral  Resp: 20 20 20 18   Height:      Weight:      SpO2: 99% 97% 98% 99%   Body mass index is 22.58 kg/(m^2).  General: Sound asleep. His sitter tells me that he was alert  and talking appropriately between 7 and 8:30 this morning. Lungs: Clear Cor: Regular S1 and S2 with no murmur  Lab Results Lab Results  Component Value Date   WBC 4.5 11/01/2014   HGB 11.7* 11/01/2014   HCT 33.1* 11/01/2014   MCV 95.1 11/01/2014   PLT 202 11/01/2014    Lab Results  Component Value Date   CREATININE 0.80 11/02/2014   BUN 8 11/02/2014   NA 135 11/02/2014   K 4.4 11/02/2014   CL 102 11/02/2014   CO2 26 11/02/2014    Lab Results  Component Value Date   ALT 19 10/31/2014   AST 25 10/31/2014   ALKPHOS 88 10/31/2014   BILITOT 0.4 10/31/2014     Microbiology: Recent Results (from the past 240 hour(s))  Culture, blood (routine x 2)     Status: None (Preliminary result)   Collection Time: 11/01/14  2:03 PM  Result Value Ref Range Status   Specimen Description BLOOD RIGHT ANTECUBITAL  Final   Special Requests BOTTLES  DRAWN AEROBIC AND ANAEROBIC 10CC  Final   Culture   Final    NO GROWTH < 24 HOURS Performed at [facility]    Report Status PENDING  Incomplete  Culture, blood (routine x 2)     Status: None (Preliminary result)   Collection Time: 11/01/14  2:07 PM  Result Value Ref Range Status   Specimen Description BLOOD RIGHT FOREARM  Final   Special Requests BOTTLES DRAWN AEROBIC AND ANAEROBIC 10CC  Final   Culture   Final    NO GROWTH < 24 HOURS Performed at [facility]    Report Status PENDING  Incomplete    Problem List Items Addressed This Visit      Unprioritized   Bacteremia    He had transient MRSA bacteremia during his recent hospitalization in late July. Repeat blood cultures during this admission are negative at 48 hours. I will continue vancomycin.      Relevant Orders   MR Brain Wo Contrast   Fall   * (Principal)Hyponatremia   Seizure - Primary   Relevant Medications   carbamazepine (TEGRETOL) tablet 200 mg   levETIRAcetam (KEPPRA) tablet 1,500 mg   pregabalin (LYRICA) capsule 100 mg   pregabalin (LYRICA) capsule  50 mg       Cliffton Asters, MD [facility] [phone] pager   [phone] cell 11/03/2014, 9:55 AM

## 2014-11-04 ENCOUNTER — Inpatient Hospital Stay (HOSPITAL_COMMUNITY): Payer: Medicaid Other

## 2014-11-04 ENCOUNTER — Other Ambulatory Visit: Payer: Self-pay

## 2014-11-04 DIAGNOSIS — S51012D Laceration without foreign body of left elbow, subsequent encounter: Secondary | ICD-10-CM

## 2014-11-04 DIAGNOSIS — S51019A Laceration without foreign body of unspecified elbow, initial encounter: Secondary | ICD-10-CM | POA: Insufficient documentation

## 2014-11-04 LAB — VANCOMYCIN, TROUGH: Vancomycin Tr: 16 ug/mL (ref 10.0–20.0)

## 2014-11-04 MED ORDER — LINEZOLID 600 MG PO TABS
600.0000 mg | ORAL_TABLET | Freq: Two times a day (BID) | ORAL | Status: DC
  Administered 2014-11-04 – 2014-11-06 (×5): 600 mg via ORAL
  Filled 2014-11-04 (×6): qty 1

## 2014-11-04 NOTE — Progress Notes (Signed)
Clinical Social Work  CSW rounded on patient with psych MD. Patient reports pain in ribs in elbow but denies any MH concerns at this time. CSW provided patient with low income housing list. Patient continues to refuse SNF or [name] placement. Patient reports he has a Charity fundraiser that comes to check on him daily and can assist with finding new housing if he decides to move.  [place], [place] [phone]

## 2014-11-04 NOTE — Progress Notes (Addendum)
[facility] PROGRESS NOTE  [name] [name] ZOX:[id number] DOB: May 07, 1960 DOA: 10/31/2014 PCP: [name], MD  BRIEF NARRATIVE 54 year old male with history of alcohol abuse, chronic seizures (secondary to aneurysmal rupture , on Tegretol and Keppra as outpatient) recently hospitalized after sustaining a fall, hypertension, tobacco abuse and hepatitis C, recently hospitalized with acute metabolic encephalopathy secondary to Tegretol toxicity, with prolonged hospital stay due to hyponatremia and MRSA bacteremia. Patient signed out AMA on 8/1 9 as he did not want to miss his pay check and did not take any medications with him. He was then taking 400 mg of Tegretol 3 times a day at home. He presented to the ED on 8/12 with a fall secondary to possible seizure and hitting his left elbow. Patient was found to be hyponatremic with sodium of 122. Admitted to telemetry.  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 He has had issue of hyponatremia with tegretol and we had decided to increase his Keppra dose to 1500 mg twice a day and stop Tegretol during recent hospitalization. Patient however continued high-dose tegretol after signing out AMA.  Reduced tegretol to 200 mg 3 times a day after discussing with neurohospitalist. Sodium level normalized.  increased dose of Keppra  discontinued Tegretol today. -No seizure activity noted in the hospital. -Continue Lyrica. -pt to be discharged on keppra 1500 mg bid. -has appt with [facility] on 12/04/2014  MRSA bacteremia  1/2 blood cx from 7/28. Was being treated with IV vancomycin and levaquin empirically during last admission but pt signed out AMA.   2-D echo done during last hospitalization was negative for any vegetation. -Started on empiric IV vancomycin after cultures obtained. cx so far negative, -no mental status changes. Head CT on admission negative. MRI of the brain negative for any abscess -patient  not be compliant to IV abx with PICC line. He is also not interested in going to skilled nursing facility. His home situation is extremely poor.  -ID recommend 2 weeks of oral linezolid on discharge. Verified with case Production designer, theatre/television/film. Generic linezolid will be covered by his Medicaid.   Agitation and aggressive behavior This is more due to his personality than underlying psych issues. Seen by psychiatry and deemed competent and no further recommendations.  Seizure Disorder As outlined above. Head CT, cervical spine, CT chest, abdomen and pelvis unremarkable. X-ray of the left elbow shows continued fracture left ulna (sustained upon prior hospitalization . Would care consulted for left elbow wound.  Alcohol abuse  Monitor on  CIWA  Left ulnar fracture S/ fall . Pain control with prn Vicodin.  Was supposed to follow up with Dr Renaye Rakers as outpt but singed out AMA.  scheduled appt for 11/10/2014 at 10;15 am.  DVT prophylaxis:    diet: Regular   Code Status: full code Family Communication: none at bedside. Spoke with his personal case manager kim on the phone Disposition Plan: home possibly in am. Discharge held de to c/o bilateral ribs pain and unsteadiness xray rib show occult fracture ( possibly from fall,  PT eval pending).   Consultants:  ID  Procedures:  Head CT  MRI brian    Antibiotics:  IV vancomycin  HPI/Subjective: Seen and examined. C/o pain in b/l ribs. As per nurse he was unsteady on his feet. Again cursing the nursing staff that he's placed on fluid restriction.   Objective: Filed Vitals:   11/04/14 0632  BP: 157/77  Pulse: 61  Temp: 98.3 F (36.8 C)  Resp: 18  Intake/Output Summary (Last 24 hours) at 11/04/14 1442 Last data filed at 11/04/14 1300  Gross per 24 hour  Intake   2172 ml  Output   1550 ml  Net    622 ml   Filed Weights   10/31/14 2315  Weight: 88.6 kg (195 lb 5.2 oz)    Exam:   General: NAD, speely  Chest: Clear to  auscultation bilaterally. Tender to pressure over b/l ribs  CVS: Normal S1 and S2, normal  GI: Soft, nondistended, nontender,  Musculoskeletal: no edema  CNS: alert and oriented   Data Reviewed: Basic Metabolic Panel:  Recent Labs Lab 10/31/14 1840 11/01/14 0550 11/02/14 0716  NA 122* 129* 135  K 3.8 3.7 4.4  CL 88* 95* 102  CO2 24 28 26   GLUCOSE 83 94 112*  BUN <5* 6 8  CREATININE 0.59* 0.60* 0.80  CALCIUM 8.3* 8.0* 8.5*   Liver Function Tests:  Recent Labs Lab 10/31/14 1840  AST 25  ALT 19  ALKPHOS 88  BILITOT 0.4  PROT 7.1  ALBUMIN 3.4*   No results for input(s): LIPASE, AMYLASE in the last 168 hours. No results for input(s): AMMONIA in the last 168 hours. CBC:  Recent Labs Lab 10/31/14 1840 11/01/14 0550  WBC 5.3 4.5  NEUTROABS 2.6  --   HGB 12.5* 11.7*  HCT 34.3* 33.1*  MCV 93.7 95.1  PLT 238 202   Cardiac Enzymes: No results for input(s): CKTOTAL, CKMB, CKMBINDEX, TROPONINI in the last 168 hours. BNP (last 3 results)  Recent Labs  10/14/14 1825  BNP 64.4    ProBNP (last 3 results) No results for input(s): PROBNP in the last 8760 hours.  CBG: No results for input(s): GLUCAP in the last 168 hours.  Recent Results (from the past 240 hour(s))  Culture, blood (routine x 2)     Status: None (Preliminary result)   Collection Time: 11/01/14  2:03 PM  Result Value Ref Range Status   Specimen Description BLOOD RIGHT ANTECUBITAL  Final   Special Requests BOTTLES DRAWN AEROBIC AND ANAEROBIC 10CC  Final   Culture   Final    NO GROWTH 3 DAYS Performed at [facility]    Report Status PENDING  Incomplete  Culture, blood (routine x 2)     Status: None (Preliminary result)   Collection Time: 11/01/14  2:07 PM  Result Value Ref Range Status   Specimen Description BLOOD RIGHT FOREARM  Final   Special Requests BOTTLES DRAWN AEROBIC AND ANAEROBIC 10CC  Final   Culture   Final    NO GROWTH 3 DAYS Performed at [facility]     Report Status PENDING  Incomplete     Studies: Mr Brain Wo Contrast  11/03/2014   CLINICAL DATA:  Chronic seizure disorder. Recent hospitalization after fall Ing. Acute metabolic encephalopathy.  EXAM: MRI HEAD WITHOUT CONTRAST  TECHNIQUE: Multiplanar, multiecho pulse sequences of the brain and surrounding structures were obtained without intravenous contrast.  COMPARISON:  Head CT 10/31/2014  FINDINGS: The study suffers from some motion degradation but is sufficiently diagnostic. Diffusion imaging does not show any acute or subacute infarction. The brain shows generalized atrophy. No focal abnormality affects the brainstem or cerebellum. Within the cerebral hemispheres, there is bifrontal atrophy and gliosis, most commonly seen as a sequela of previous head trauma. Lesser changes also affect the temporal tips (left more than right), also typical of previous trauma. No evidence of ischemic infarctions. No mass lesion, acute hemorrhage, hydrocephalus or extra-axial  collection. There are small foci of residual hemosiderin deposition in the frontal and temporal regions probably related to previous head trauma. No mesial temporal lesion or asymmetry. No pituitary mass. No inflammatory sinus disease. No skull or skullbase lesion.  IMPRESSION: No acute or reversible finding. Generalized brain atrophy. Bifrontal atrophy and gliosis presumably related to distant head trauma. Lesser changes of atrophy and gliosis affect the temporal tips as well.   Electronically Signed   By: Paulina Fusi M.D.   On: 11/03/2014 16:19    Scheduled Meds: . carbamazepine  200 mg Oral TID  . folic acid  1 mg Oral Daily  . heparin  5,000 Units Subcutaneous 3 times per day  . levETIRAcetam  1,500 mg Oral BID  . linezolid  600 mg Oral Q12H  . metoprolol  25 mg Oral BID  . multivitamin with minerals  1 tablet Oral Daily  . nicotine  21 mg Transdermal Daily  . pregabalin  100 mg Oral BID AC & HS  . pregabalin  50 mg Oral QAC lunch   . sodium chloride  3 mL Intravenous Q12H  . thiamine  100 mg Oral Daily   Continuous Infusions:     Time spent: 25 minutes    [name] [name]  [facility] Pager [phone]. If 7PM-7AM, please contact night-coverage at [email or web address], password [facility] 11/04/2014, 2:42 PM  LOS: 4 days

## 2014-11-04 NOTE — Progress Notes (Signed)
Spoke with MD and okay to leave IV out at this time. [name] [name] A

## 2014-11-04 NOTE — Progress Notes (Signed)
Pt cursing and yelling at nurse and sitter because of fluid restriction. Pt was told he would have fluids given to him when his breakfast arrived because of his fluid restriction order. Pt became verbally aggressive with staff telling us we were lying. Pt was politely reminded to not curse at staff which he responded to with a curse. Will continue to monitor.

## 2014-11-04 NOTE — Progress Notes (Signed)
ANTIBIOTIC CONSULT NOTE  Pharmacy Consult for Vancomycin Indication: MRSA bacteremia   No Known Allergies  Patient Measurements: Height:  (198.1 cm) Weight: 195 lb 5.2 oz (88.6 kg) IBW/kg (Calculated) : 91.4   Vital Signs: Temp: 98.3 F (36.8 C) (08/16 0632) Temp Source: Oral (08/16 6644) BP: 157/77 mmHg (08/16 0347) Pulse Rate: 61 (08/16 0632) Intake/Output from previous day: 08/15 0701 - 08/16 0700 In: 2412 [P.O.:1662; IV Piggyback:750] Out: 1700 [Urine:1700] Intake/Output from this shift: Total I/O In: 240 [P.O.:240] Out: -   Labs:  Recent Labs  11/02/14 0716  CREATININE 0.80   Estimated Creatinine Clearance: 130.7 mL/min (by C-G formula based on Cr of 0.8).  Recent Labs  11/04/14 0750  [facility] 16     Microbiology: Recent Results (from the past 720 hour(s))  MRSA PCR Screening     Status: None   Collection Time: 10/08/14  4:46 PM  Result Value Ref Range Status   MRSA by PCR NEGATIVE NEGATIVE Final    Comment:        The GeneXpert MRSA Assay (FDA approved for NASAL specimens only), is one component of a comprehensive MRSA colonization surveillance program. It is not intended to diagnose MRSA infection nor to guide or monitor treatment for MRSA infections.   Urine culture     Status: None   Collection Time: 10/08/14  6:03 PM  Result Value Ref Range Status   Specimen Description URINE, CLEAN CATCH  Final   Special Requests NONE  Final   Culture   Final    MULTIPLE SPECIES PRESENT, SUGGEST RECOLLECTION Performed at [facility]    Report Status 10/10/2014 FINAL  Final  Urine culture     Status: None   Collection Time: 10/16/14 11:24 AM  Result Value Ref Range Status   Specimen Description URINE, RANDOM  Final   Special Requests NONE  Final   Culture   Final    NO GROWTH 1 DAY Performed at [facility]    Report Status 10/17/2014 FINAL  Final  Culture, blood (routine x 2)     Status: None   Collection Time: 10/16/14   4:15 PM  Result Value Ref Range Status   Specimen Description BLOOD RIGHT HAND  Final   Special Requests BOTTLES DRAWN AEROBIC AND ANAEROBIC 10CC  Final   Culture   Final    NO GROWTH 5 DAYS Performed at [facility]    Report Status 10/21/2014 FINAL  Final  Culture, blood (routine x 2)     Status: None   Collection Time: 10/16/14  4:25 PM  Result Value Ref Range Status   Specimen Description BLOOD RIGHT HAND  Final   Special Requests BOTTLES DRAWN AEROBIC AND ANAEROBIC 10CC  Final   Culture  Setup Time   Final    GRAM POSITIVE COCCI IN CLUSTERS AEROBIC BOTTLE ONLY CRITICAL RESULT CALLED TO, READ BACK BY AND VERIFIED WITH: N DUMAS,RN AT 1519 10/17/14 BY L BENFIELD    Culture   Final    METHICILLIN RESISTANT STAPHYLOCOCCUS AUREUS Performed at [facility]    Report Status 10/19/2014 FINAL  Final   Organism ID, Bacteria METHICILLIN RESISTANT STAPHYLOCOCCUS AUREUS  Final      Susceptibility   Methicillin resistant staphylococcus aureus - MIC*    CIPROFLOXACIN >=8 RESISTANT Resistant     ERYTHROMYCIN >=8 RESISTANT Resistant     GENTAMICIN <=0.5 SENSITIVE Sensitive     OXACILLIN >=4 RESISTANT Resistant     TETRACYCLINE <=1 SENSITIVE  Sensitive     VANCOMYCIN 1 SENSITIVE Sensitive     TRIMETH/SULFA <=10 SENSITIVE Sensitive     CLINDAMYCIN <=0.25 SENSITIVE Sensitive     RIFAMPIN <=0.5 SENSITIVE Sensitive     Inducible Clindamycin NEGATIVE Sensitive     * METHICILLIN RESISTANT STAPHYLOCOCCUS AUREUS  Culture, blood (routine x 2)     Status: None   Collection Time: 10/18/14  6:30 PM  Result Value Ref Range Status   Specimen Description BLOOD LEFT HAND  Final   Special Requests BOTTLES DRAWN AEROBIC ONLY 5CC  Final   Culture   Final    NO GROWTH 5 DAYS Performed at [facility]    Report Status 10/23/2014 FINAL  Final  Culture, blood (routine x 2)     Status: None   Collection Time: 10/18/14  6:35 PM  Result Value Ref Range Status   Specimen Description  BLOOD RIGHT ANTECUBITAL  Final   Special Requests BOTTLES DRAWN AEROBIC ONLY 6CC  Final   Culture   Final    NO GROWTH 5 DAYS Performed at [facility]    Report Status 10/23/2014 FINAL  Final  Culture, blood (routine x 2)     Status: None (Preliminary result)   Collection Time: 11/01/14  2:03 PM  Result Value Ref Range Status   Specimen Description BLOOD RIGHT ANTECUBITAL  Final   Special Requests BOTTLES DRAWN AEROBIC AND ANAEROBIC 10CC  Final   Culture   Final    NO GROWTH 2 DAYS Performed at [facility]    Report Status PENDING  Incomplete  Culture, blood (routine x 2)     Status: None (Preliminary result)   Collection Time: 11/01/14  2:07 PM  Result Value Ref Range Status   Specimen Description BLOOD RIGHT FOREARM  Final   Special Requests BOTTLES DRAWN AEROBIC AND ANAEROBIC 10CC  Final   Culture   Final    NO GROWTH 2 DAYS Performed at [facility]    Report Status PENDING  Incomplete    Medical History: Past Medical History  Diagnosis Date  . Seizure   . ETOHism   . Aneurysm   . Hypertension   . Hepatitis C   . Hereditary and idiopathic peripheral neuropathy 06/03/2014    Medications:  Scheduled:  . carbamazepine  200 mg Oral TID  . folic acid  1 mg Oral Daily  . heparin  5,000 Units Subcutaneous 3 times per day  . levETIRAcetam  1,500 mg Oral BID  . metoprolol  25 mg Oral BID  . multivitamin with minerals  1 tablet Oral Daily  . nicotine  21 mg Transdermal Daily  . pregabalin  100 mg Oral BID AC & HS  . pregabalin  50 mg Oral QAC lunch  . sodium chloride  3 mL Intravenous Q12H  . thiamine  100 mg Oral Daily  . vancomycin  1,250 mg Intravenous Q8H   Infusions:   PRN: HYDROcodone-acetaminophen, LORazepam **OR** LORazepam, LORazepam Assessment: 53yoM  with history of alcohol and tobacco abuse, chronic seizures, HTN, and Hep C who was recently hospitalized with acute metabolic encephalopathy secondary to Tegretol toxicity,  hyponatremia and MRSA bacteremia. His blood Cx (1/2) on 7/28 grew MRSA, treated with IV vancomycin and the plan was to place a PICC line. He left AMA on 8/1 and presented to the ED on 8/12 with a fall secondary to possible seizure. ID recommended repeat blood cultures and brain MRI to r/o brain abcess given bacteremia and recurrent  seizures. Pt was previously on Vancomycin 1gm IV Q8h with Vanco trough level of 11.  Vancomycin trough at goal.  Bump in Scr today.  24hr I/O now + 712 ml.   Goal of Therapy:  Vancomycin trough level 15-20 mcg/ml Eradication of infection  Plan:  Continue Vancomycin 1250mg  IV q8h Weekly Vanco trough level & Scr while on Vancomycin  Monitor renal function and cx data  F/U long-term antibiotic plans  Junita Push, PharmD, BCPS Pager: [phone] 11/04/2014,9:11 AM

## 2014-11-04 NOTE — Progress Notes (Signed)
[facility]    Subjective: Pain meds not working well   Antibiotics:  Anti-infectives    Start     Dose/Rate Route Frequency Ordered Stop   11/04/14 1315  linezolid (ZYVOX) tablet 600 mg     600 mg Oral Every 12 hours 11/04/14 1307     11/02/14 0800  vancomycin (VANCOCIN) 1,250 mg in sodium chloride 0.9 % 250 mL IVPB  Status:  Discontinued     1,250 mg 166.7 mL/hr over 90 Minutes Intravenous Every 8 hours 11/02/14 0740 11/04/14 1307      Medications: Scheduled Meds: . carbamazepine  200 mg Oral TID  . folic acid  1 mg Oral Daily  . heparin  5,000 Units Subcutaneous 3 times per day  . levETIRAcetam  1,500 mg Oral BID  . linezolid  600 mg Oral Q12H  . metoprolol  25 mg Oral BID  . multivitamin with minerals  1 tablet Oral Daily  . nicotine  21 mg Transdermal Daily  . pregabalin  100 mg Oral BID AC & HS  . pregabalin  50 mg Oral QAC lunch  . sodium chloride  3 mL Intravenous Q12H  . thiamine  100 mg Oral Daily   Continuous Infusions:  PRN Meds:.HYDROcodone-acetaminophen, LORazepam    Objective: Weight change:   Intake/Output Summary (Last 24 hours) at 11/04/14 1307 Last data filed at 11/04/14 1300  Gross per 24 hour  Intake   2172 ml  Output   1750 ml  Net    422 ml   Blood pressure 157/77, pulse 61, temperature 98.3 F (36.8 C), temperature source Oral, resp. rate 18, height 6\' 6"  (1.981 m), weight 195 lb 5.2 oz (88.6 kg), SpO2 100 %. Temp:  [98 F (36.7 C)-98.6 F (37 C)] 98.3 F (36.8 C) (08/16 1610) Pulse Rate:  [59-83] 61 (08/16 0632) Resp:  [18] 18 (08/16 0632) BP: (143-165)/(67-91) 157/77 mmHg (08/16 0632) SpO2:  [95 %-100 %] 100 % (08/16 9604)  Physical Exam: General: Alert and awake, oriented x3, not in any acute distress. Eating lunch comfortably, elbow dressed  Neuro: nonfocal  CBC:  CBC Latest Ref Rng 11/01/2014 10/31/2014 10/20/2014  WBC 4.0 - 10.5 K/uL 4.5 5.3 6.1  Hemoglobin 13.0 - 17.0 g/dL  11.7(L) 12.5(L) 12.1(L)  Hematocrit 39.0 - 52.0 % 33.1(L) 34.3(L) 34.3(L)  Platelets 150 - 400 K/uL 202 238 209       BMET  Recent Labs  11/02/14 0716  NA 135  K 4.4  CL 102  CO2 26  GLUCOSE 112*  BUN 8  CREATININE 0.80  CALCIUM 8.5*     Liver Panel  No results for input(s): PROT, ALBUMIN, AST, ALT, ALKPHOS, BILITOT, BILIDIR, IBILI in the last 72 hours.     Sedimentation Rate No results for input(s): ESRSEDRATE in the last 72 hours. C-Reactive Protein No results for input(s): CRP in the last 72 hours.  Micro Results: Recent Results (from the past 720 hour(s))  MRSA PCR Screening     Status: None   Collection Time: 10/08/14  4:46 PM  Result Value Ref Range Status   MRSA by PCR NEGATIVE NEGATIVE Final    Comment:        The GeneXpert MRSA Assay (FDA approved for NASAL specimens only), is one component of a comprehensive MRSA colonization surveillance program. It is not intended to diagnose MRSA infection nor to guide or monitor treatment for MRSA infections.  Urine culture     Status: None   Collection Time: 10/08/14  6:03 PM  Result Value Ref Range Status   Specimen Description URINE, CLEAN CATCH  Final   Special Requests NONE  Final   Culture   Final    MULTIPLE SPECIES PRESENT, SUGGEST RECOLLECTION Performed at [facility]    Report Status 10/10/2014 FINAL  Final  Urine culture     Status: None   Collection Time: 10/16/14 11:24 AM  Result Value Ref Range Status   Specimen Description URINE, RANDOM  Final   Special Requests NONE  Final   Culture   Final    NO GROWTH 1 DAY Performed at [facility]    Report Status 10/17/2014 FINAL  Final  Culture, blood (routine x 2)     Status: None   Collection Time: 10/16/14  4:15 PM  Result Value Ref Range Status   Specimen Description BLOOD RIGHT HAND  Final   Special Requests BOTTLES DRAWN AEROBIC AND ANAEROBIC 10CC  Final   Culture   Final    NO GROWTH 5 DAYS Performed at [facility]    Report Status 10/21/2014 FINAL  Final  Culture, blood (routine x 2)     Status: None   Collection Time: 10/16/14  4:25 PM  Result Value Ref Range Status   Specimen Description BLOOD RIGHT HAND  Final   Special Requests BOTTLES DRAWN AEROBIC AND ANAEROBIC 10CC  Final   Culture  Setup Time   Final    GRAM POSITIVE COCCI IN CLUSTERS AEROBIC BOTTLE ONLY CRITICAL RESULT CALLED TO, READ BACK BY AND VERIFIED WITH: N DUMAS,RN AT 1519 10/17/14 BY L BENFIELD    Culture   Final    METHICILLIN RESISTANT STAPHYLOCOCCUS AUREUS Performed at [facility]    Report Status 10/19/2014 FINAL  Final   Organism ID, Bacteria METHICILLIN RESISTANT STAPHYLOCOCCUS AUREUS  Final      Susceptibility   Methicillin resistant staphylococcus aureus - MIC*    CIPROFLOXACIN >=8 RESISTANT Resistant     ERYTHROMYCIN >=8 RESISTANT Resistant     GENTAMICIN <=0.5 SENSITIVE Sensitive     OXACILLIN >=4 RESISTANT Resistant     TETRACYCLINE <=1 SENSITIVE Sensitive     VANCOMYCIN 1 SENSITIVE Sensitive     TRIMETH/SULFA <=10 SENSITIVE Sensitive     CLINDAMYCIN <=0.25 SENSITIVE Sensitive     RIFAMPIN <=0.5 SENSITIVE Sensitive     Inducible Clindamycin NEGATIVE Sensitive     * METHICILLIN RESISTANT STAPHYLOCOCCUS AUREUS  Culture, blood (routine x 2)     Status: None   Collection Time: 10/18/14  6:30 PM  Result Value Ref Range Status   Specimen Description BLOOD LEFT HAND  Final   Special Requests BOTTLES DRAWN AEROBIC ONLY 5CC  Final   Culture   Final    NO GROWTH 5 DAYS Performed at [facility]    Report Status 10/23/2014 FINAL  Final  Culture, blood (routine x 2)     Status: None   Collection Time: 10/18/14  6:35 PM  Result Value Ref Range Status   Specimen Description BLOOD RIGHT ANTECUBITAL  Final   Special Requests BOTTLES DRAWN AEROBIC ONLY 6CC  Final   Culture   Final    NO GROWTH 5 DAYS Performed at [facility]    Report Status 10/23/2014 FINAL  Final  Culture,  blood (routine x 2)     Status: None (Preliminary result)   Collection Time: 11/01/14  2:03 PM  Result Value Ref Range Status   Specimen Description BLOOD RIGHT ANTECUBITAL  Final   Special Requests BOTTLES DRAWN AEROBIC AND ANAEROBIC 10CC  Final   Culture   Final    NO GROWTH 3 DAYS Performed at [facility]    Report Status PENDING  Incomplete  Culture, blood (routine x 2)     Status: None (Preliminary result)   Collection Time: 11/01/14  2:07 PM  Result Value Ref Range Status   Specimen Description BLOOD RIGHT FOREARM  Final   Special Requests BOTTLES DRAWN AEROBIC AND ANAEROBIC 10CC  Final   Culture   Final    NO GROWTH 3 DAYS Performed at [facility]    Report Status PENDING  Incomplete    Studies/Results: Mr Brain Wo Contrast  11/03/2014   CLINICAL DATA:  Chronic seizure disorder. Recent hospitalization after fall Ing. Acute metabolic encephalopathy.  EXAM: MRI HEAD WITHOUT CONTRAST  TECHNIQUE: Multiplanar, multiecho pulse sequences of the brain and surrounding structures were obtained without intravenous contrast.  COMPARISON:  Head CT 10/31/2014  FINDINGS: The study suffers from some motion degradation but is sufficiently diagnostic. Diffusion imaging does not show any acute or subacute infarction. The brain shows generalized atrophy. No focal abnormality affects the brainstem or cerebellum. Within the cerebral hemispheres, there is bifrontal atrophy and gliosis, most commonly seen as a sequela of previous head trauma. Lesser changes also affect the temporal tips (left more than right), also typical of previous trauma. No evidence of ischemic infarctions. No mass lesion, acute hemorrhage, hydrocephalus or extra-axial collection. There are small foci of residual hemosiderin deposition in the frontal and temporal regions probably related to previous head trauma. No mesial temporal lesion or asymmetry. No pituitary mass. No inflammatory sinus disease. No skull or skullbase  lesion.  IMPRESSION: No acute or reversible finding. Generalized brain atrophy. Bifrontal atrophy and gliosis presumably related to distant head trauma. Lesser changes of atrophy and gliosis affect the temporal tips as well.   Electronically Signed   By: Paulina Fusi M.D.   On: 11/03/2014 16:19      Assessment/Plan:  Principal Problem:   Hyponatremia Active Problems:   History of alcohol abuse   Seizure   Bacteremia   Fall   MRSA bacteremia   Paranoia   Dementia with behavioral disturbance    [name] [name] is a 54 y.o. male with   Alcoholism, chronic hepatitis c without coma, seizures, hyponatremia, MRSA bacteremia, falls with fracture and wound of left elbow  #1 MRSA bacteremia: not a candidate for PICC and will not go to SNF  NOTE HIS MRSA IS NOT FLUOROQUINOLONE SENSITIVE SO THAT IS NOT AN OPTION FOR ORAL THERAPY  I AM CHANGING  HIM TO ZYVOX  Would ask for Case management to investigate if his Medicaid would cover this (I believe it does nowadays)  I would try to send him out with 2 weeks of zyvox orally and hope that he will take this  We are not going to get a TEE and completely exclude complicated bacteremia but I am not convinced we could get him to take something for a month as an outpatient   Would consult case management to see if he can get zyvox,    #2 Chronic hepatitis C without coma: not a candidate for rx with unstable conditions  I will otherwise sign off please call back with further questions.   LOS: 4 days   Acey Lav 11/04/2014, 1:07 PM

## 2014-11-04 NOTE — Progress Notes (Signed)
Pt told sitter to bring this RN to room because his right arm was hurting. Upon arrival and assessment, pt stated his right arm didn't hurt but his left arm hurt. This elbow has been hurting since patient fell a couple of weeks ago and broke it. Pt demanded more pain medicine asking for morphine stating he had received that earlier. Upon review of the [facility], last time patient received morphine was in the ED. Explained to patient that he had received pain pills less than 2 hours ago and ativan IV. Pt stated nurse was lying. RN showed him MAR. Pt began to curse at this RN that I was only refusing him pain medicine because I wanted to cause him pain. I explained to the patient that I was following the MD's orders. Pt was given ice pack to place on elbow. Will continue to monitor.

## 2014-11-05 DIAGNOSIS — E871 Hypo-osmolality and hyponatremia: Secondary | ICD-10-CM

## 2014-11-05 MED ORDER — NICOTINE 21 MG/24HR TD PT24: 21.0000 mg | MEDICATED_PATCH | Freq: Every day | TRANSDERMAL | Status: DC

## 2014-11-05 MED ORDER — LEVETIRACETAM 750 MG PO TABS: 1500.0000 mg | ORAL_TABLET | Freq: Two times a day (BID) | ORAL | Status: DC

## 2014-11-05 MED ORDER — LINEZOLID 600 MG PO TABS: 600.0000 mg | ORAL_TABLET | Freq: Two times a day (BID) | ORAL | Status: DC

## 2014-11-05 NOTE — Care Management Note (Signed)
Case Management Note  Patient Details  Name: [name] [name] MRN: [id number] Date of Birth: 27-Feb-1961  Subjective/Objective:                    Action/Plan: Discharge planning, patient refusing SNF.   Expected Discharge Date:                  Expected Discharge Plan:  Home/Self Care  In-House Referral:  Clinical Social Work  Discharge planning Services  CM Consult  Post Acute Care Choice:    Choice offered to:     DME Arranged:    DME Agency:     HH Arranged:    HH Agency:     Status of Service:  Completed, signed off  Medicare Important Message Given:    Date Medicare IM Given:    Medicare IM give by:    Date Additional Medicare IM Given:    Additional Medicare Important Message give by:     If discussed at Long Length of Stay Meetings, dates discussed:    Additional Comments:  Alexis Goodell, RN 11/05/2014, 1:02 PM

## 2014-11-05 NOTE — Discharge Summary (Signed)
Physician Discharge Summary  Har[facility] Zeiss ZOX:[id number] DOB: 04-21-1960 DOA: 10/31/2014  PCP: [name], MD  Admit date: 10/31/2014 Discharge date: 11/05/2014  Time spent: 40 minutes  Recommendations for Outpatient Follow-up:  Orders Before Discharge  - Discharge to Home  Take after Discharge  - folic acid (FOLVITE) 1 MG tablet  - New dosing levETIRAcetam (KEPPRA) 750 MG tablet  -  NEw med-linezolid (ZYVOX) 600 MG tablet twice a day for 12 more days ending on 11/16/14 - - metoprolol (LOPRESSOR) 50 MG tablet  - nicotine (NICODERM CQ - DOSED IN MG/24 HOURS) 21 mg/24hr patch  - pregabalin (LYRICA) 50 MG capsule  Stop Taking After Discharge  - carbamazepine (TEGRETOL) 200 MG tablet -  levETIRAcetam (KEPPRA) 1000 MG tablet 1. Needs Bmet 1 week to check on hyponatremia 2. Patient has been deemed to have capacity but refused categorically SKILLED placement-He will get "5 hours of home health" per his report--I discussed his care manager kim and he still refused SNF therapy 3. He is at high risk for hospital readmission 4. NO PAIN meds to be given as OP 5. 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  Patient signed out AMA on 8/1 as he did not want to miss his pay check and did not take any medications with him. He was then taking 400 mg of Tegretol 3 times a day at home. He presented to the ED on 8/12 with a fall secondary to possible seizure and hitting his left elbow. Patient was found to be hyponatremic with sodium of 122. 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 He has had issue of hyponatremia with tegretol and we had decided to increase his Keppra dose to 1500 mg twice a day and stop Tegretol during recent hospitalization. Patient however continued high-dose tegretol after signing out AMA. Reduced tegretol to 200 mg 3 times a day after discussing with neurohospitalist. Sodium level normalized. increased dose of Keppra discontinued Tegretol this admission -No seizure activity noted in the hospital. -Continue Lyrica. -pt to be discharged on keppra 1500 mg bid. -has appt with [facility] on 12/04/2014  MRSA bacteremia  1/2 blood cx from 7/28.  Was being treated with IV vancomycin and levaquin empirically during last admission but pt signed out AMA.  2-D echo done during last hospitalization was negative for any vegetation. -Started on empiric IV vancomycin after cultures obtained. cx so far negative, -no mental status changes. Head CT on admission negative. MRI of the brain negative for any abscess -patient not be compliant to IV abx with PICC line.  He is also not interested in going to skilled nursing facility. His home situation is extremely poor.  -  ID recommend 2 weeks of oral linezolid on discharge, ending 11/15/14 Case manager. Generic linezolid will be covered by his Medicaid.   Agitation and aggressive behavior This is more due to his personality than underlying psych issues.  Seen by psychiatry and deemed competent and no further recommendations.  Seizure Disorder As outlined  above. Head CT, cervical spine, CT chest, abdomen and pelvis unremarkable. X-ray of the left elbow shows continued fracture left ulna (sustained upon prior hospitalization . Would care consulted for left elbow wound.  Alcohol abuse  Monitor on CIWA  Left ulnar fracture S/ fall . Pain control with prn Vicodin. Was supposed to follow up with Dr Renaye Rakers as outpt but singed out AMA. scheduled appt for 11/10/2014 at 10;15 am.  Discharge Exam: Filed Vitals:   11/05/14 0900  BP: 174/84  Pulse: 72  Temp:   Resp:     General: alert halting but clear speech Cardiovascular: s1 s 2no m/r/g Respiratory: clear no added sound  Discharge Instructions   Discharge Instructions    Diet - low sodium heart healthy    Complete by:  As directed      Discharge instructions    Complete by:  As directed   NO MORE Tegretol Continue Keppra 1,500 mg twice daily Needs labs 1-2 weeks Needs pain management as OP Needs follow up with [facility]     Discharge wound care:    Complete by:  As directed   Wet - dry dressing to Elbow     Increase activity slowly    Complete by:  As directed           Current Discharge Medication List    START taking these medications   Details  linezolid (ZYVOX) 600 MG tablet Take 1 tablet (600 mg total) by mouth every 12 (twelve) hours. Qty: 24 tablet, Refills: 0    nicotine (NICODERM CQ - DOSED IN MG/24 HOURS) 21 mg/24hr patch Place 1 patch (21 mg total) onto the skin daily. Qty: 28 patch, Refills: 0      CONTINUE these medications which have CHANGED   Details  levETIRAcetam (KEPPRA) 750 MG tablet Take 2 tablets (1,500 mg total) by mouth 2 (two) times daily. Qty: 120 tablet, Refills: 0      CONTINUE these medications which have NOT CHANGED   Details  folic acid (FOLVITE) 1 MG tablet Take 1 tablet (1 mg total) by mouth daily. Qty: 30 tablet, Refills: 2    metoprolol (LOPRESSOR) 50 MG tablet Take 0.5 tablets (25 mg total) by mouth 2  (two) times daily. Qty: 60 tablet, Refills: 3   Associated Diagnoses: Essential hypertension, benign    pregabalin (LYRICA) 50 MG capsule Take 2 tablets in the morning, 1 tablet at noon and 2 tablets at bedtime. Qty: 150 capsule, Refills: 5      STOP taking these medications     carbamazepine (TEGRETOL) 200 MG tablet        No Known Allergies Follow-up Information    Follow up with Sheral Apley, MD On 11/10/2014.   Specialty:  Orthopedic Surgery   Why:  10:30 am   Contact information:   [address] [place] [address] [phone]        The results of significant diagnostics from this hospitalization (including imaging, microbiology, ancillary and laboratory) are listed below for reference.    Significant Diagnostic Studies: Dg Ribs Unilateral W/chest Right  10/31/2014   CLINICAL DATA:  Right-sided rib pain after  fall at home.  EXAM: RIGHT RIBS AND CHEST - 3+ VIEW  COMPARISON:  October 16, 2014.  FINDINGS: Mildly displaced fractures are seen involving the posterior portions of the right tenth, eleventh and twelfth ribs. No pneumothorax or pleural effusion is noted.  IMPRESSION: Mildly displaced right tenth, eleventh and twelfth rib fractures. No acute cardiopulmonary abnormality is noted.   Electronically Signed   By: Lupita Raider, M.D.   On: 10/31/2014 19:48   Dg Ribs Unilateral W/chest Right  10/08/2014   CLINICAL DATA:  54 year old male with seizure yesterday morning and fall. Pain radiating to the right axilla and spine. Initial encounter.  EXAM: RIGHT RIBS AND CHEST - 3+ VIEW  COMPARISON:  Chest and left rib series 6 05/2014.  FINDINGS: Semi upright AP view of the chest. Low lung volumes with diffuse crowding of markings. Stable cardiomegaly and mediastinal contours. No pneumothorax, pleural effusion, or consolidation identified. Small calcified granuloma in the right mid lung is stable.  Chronic right posterior eighth rib fracture with callus. Chronic  appearing right lateral fourth rib fracture. No acute displaced right rib fracture identified. Grossly intact visualized spinal vertebrae. No acute osseous abnormality identified.  IMPRESSION: 1. 1. No acute displaced right rib fracture identified. Chronic right fourth and eighth rib fractures. 2. Low lung volumes, otherwise no acute cardiopulmonary abnormality.   Electronically Signed   By: Odessa Fleming M.D.   On: 10/08/2014 12:11   Dg Ribs Bilateral  11/04/2014   CLINICAL DATA:  Fall, secondary to possible seizure. Pain in mid to lower bilateral ribs.  EXAM: BILATERAL RIBS - 3+ VIEW  COMPARISON:  10/31/2014 CT.  FINDINGS: Frontal view of the chest and multiple oblique rib images bilaterally. Midline trachea. Normal heart size with a tortuous thoracic aorta. No pleural effusion or pneumothorax. Clear lungs. Minimal right hemidiaphragm elevation.  Segmental fracture of the right eleventh rib is nonacute. There is also a healing fracture of the posterior medial right twelfth rib. Nonacute fractures of the eighth through tenth posterior right ribs. No definite acute fractures are identified. Healing or healed posterior medial left twelfth rib fracture  IMPRESSION: 1.  No acute cardiopulmonary disease. 2. Bilateral nonacute rib fractures, greater on the right than left. No definite acute fracture.   Electronically Signed   By: Jeronimo Greaves M.D.   On: 11/04/2014 14:46   Dg Pelvis 1-2 Views  10/31/2014   CLINICAL DATA:  Fall at home.  EXAM: PELVIS - 1-2 VIEW  COMPARISON:  None.  FINDINGS: There is no evidence of pelvic fracture or diastasis. No pelvic bone lesions are seen. Sacroiliac and hip joints appear normal.  IMPRESSION: Normal pelvis.   Electronically Signed   By: Lupita Raider, M.D.   On: 10/31/2014 19:53   Dg Elbow Complete Left  10/31/2014   CLINICAL DATA:  Acute left elbow pain after fall at home.  EXAM: LEFT ELBOW - COMPLETE 3+ VIEW  COMPARISON:  October 13, 2014.  FINDINGS: There is continued presence of  nondisplaced fracture involving the proximal ulna with intra-articular extension. No abnormal fat pad displacement is noted. Visualized portions of radius and ulna appear normal. Mild degenerative changes are noted.  IMPRESSION: Continued presence of proximal ulnar fracture with intra-articular extension.   Electronically Signed   By: Lupita Raider, M.D.   On: 10/31/2014 19:51   Dg Elbow Complete Left  10/13/2014   CLINICAL DATA:  Fall 3 days ago. Left elbow pain and swelling. Initial encounter.  EXAM: LEFT ELBOW -  COMPLETE 3+ VIEW  COMPARISON:  10/29/2012  FINDINGS: Prominent soft tissue swelling is noted. Nondisplaced fracture of the proximal ulna seen with intra-articular extension. No other fractures identified. No evidence of dislocation. Mild degenerative spurring seen mainly involving the proximal ulna.  IMPRESSION: Nondisplaced proximal ulnar fracture with intra-articular extension.   Electronically Signed   By: Myles Rosenthal M.D.   On: 10/13/2014 15:24   Ct Head Wo Contrast  10/31/2014   CLINICAL DATA:  Unwitnessed fall  EXAM: CT HEAD WITHOUT CONTRAST  CT CERVICAL SPINE WITHOUT CONTRAST  TECHNIQUE: Multidetector CT imaging of the head and cervical spine was performed following the standard protocol without intravenous contrast. Multiplanar CT image reconstructions of the cervical spine were also generated.  COMPARISON:  10/08/2014, 08/22/14  FINDINGS: CT HEAD FINDINGS  Bony calvarium is intact. Diffuse atrophic changes are seen. There are findings consistent with encephalomalacia in the frontal areas bilaterally. This is stable from the prior exam. No acute hemorrhage or acute infarct is identified. No space-occupying mass lesion is noted.  CT CERVICAL SPINE FINDINGS  Seven cervical segments are well visualized. Vertebral body height is well maintained. Osteophytic changes are noted anteriorly at C4-5, C5-6 and C6-7. Mild facet hypertrophic changes are noted. No acute fracture or acute facet abnormality  is seen. No gross soft tissue abnormality is seen. Carotid calcifications are noted.  IMPRESSION: CT of the head: Chronic ischemic/traumatic changes without acute abnormality.  CT of the cervical spine: Degenerative change without acute abnormality.   Electronically Signed   By: Alcide Clever M.D.   On: 10/31/2014 21:07   Ct Head Wo Contrast  10/08/2014   CLINICAL DATA:  Unwitnessed seizure yesterday and this morning. Fall with seizure yesterday. ETOH, hepatitis-C, hypertension.  EXAM: CT HEAD WITHOUT CONTRAST  TECHNIQUE: Contiguous axial images were obtained from the base of the skull through the vertex without intravenous contrast.  COMPARISON:  Head CT dated 08/22/2014.  FINDINGS: Again noted is mild generalized brain atrophy with commensurate dilatation of the ventricles and sulci. Also again noted is bifrontal encephalomalacia, left greater than right, suggestive of a previous traumatic brain injury. Minimal chronic small vessel ischemic change noted within the deep periventricular white matter and there is a small old lacunar infarct within the right basal ganglia region.  There is no mass, hemorrhage, edema, or other evidence of acute parenchymal abnormality. No extra-axial hemorrhage. No acute osseous fracture or dislocation. Old displaced nasal bone fractures are stable in alignment.  IMPRESSION: Atrophy and chronic ischemic/traumatic changes as detailed above.  No evidence of acute intracranial abnormality. No intracranial mass, hemorrhage, or edema. No fracture.   Electronically Signed   By: Bary Richard M.D.   On: 10/08/2014 13:35   Ct Chest W Contrast  10/31/2014   CLINICAL DATA:  Seizure, elbow pain, probable fall  EXAM: CT CHEST, ABDOMEN, AND PELVIS WITH CONTRAST  TECHNIQUE: Multidetector CT imaging of the chest, abdomen and pelvis was performed following the standard protocol during bolus administration of intravenous contrast.  CONTRAST:  1 OMNIPAQUE IOHEXOL 300 MG/ML  SOLN  COMPARISON:   10/11/2014  FINDINGS: CT CHEST FINDINGS  Sagittal images of the spine shows no acute fractures. Sagittal view of the sternum is unremarkable. No scapular fracture. No definite clavicle fracture.  Atherosclerotic calcifications of thoracic aorta. Heart size within normal limits. No pericardial effusion.  At least 3 or 4 healing fractures are noted in right posterior lower ribs. Healing fracture in left posterior rib noted in axial image 56. Three there is  nondisplaced acute fracture of the right 12 rib. See axial image 65. Some pleural thickening noted in right posterior hemithorax.  There is no mediastinal hematoma or adenopathy. No pericardial effusion.  No lung contusion or pneumothorax. Mild atelectasis noted in right lower lobe posteriorly. There is no pneumothorax.  CT ABDOMEN AND PELVIS FINDINGS  Sagittal images of the lumbar spine shows significant disc space flattening with vacuum disc phenomenon at L4-L5 and L5-S1 level. Large anterior osteophytes and moderate disc space flattening at L3-L4 level. Atherosclerotic calcifications of abdominal aorta and iliac arteries. Enhanced liver is unremarkable. No calcified gallstones are noted within gallbladder. Enhanced pancreas, spleen and adrenal glands are unremarkable. Degenerative changes bilateral SI joints. No sacral fracture is noted.  There is no evidence of urinary bladder injury. No pelvic fractures are noted. No inguinal adenopathy. Enhanced kidneys are symmetrical in size. No hydronephrosis or hydroureter. Delayed renal images shows bilateral renal symmetrical excretion.  No pericecal inflammation. The terminal ileum is unremarkable. Normal appendix.  Coronal images shows bilateral hip joint symmetrical in appearance. No hip fractures.  IMPRESSION: 1. There are bilateral posterior healing fractures as described above. Question tiny nondisplaced acute fracture of the right twelfth rib. 2. No lung contusion or pneumothorax. 3. No mediastinal hematoma or  adenopathy. 4. Degenerative changes bilateral SI joints. Degenerative changes lumbar spine. 5. No acute visceral injury within abdomen or pelvis. 6. No pericecal inflammation.  Normal appendix. 7. No evidence of urinary bladder injury. 8. No acute fractures are noted within abdomen and pelvis. Degenerative changes lumbar spine. 9.   Electronically Signed   By: Natasha Mead M.D.   On: 10/31/2014 21:10   Ct Chest W Contrast  10/11/2014   CLINICAL DATA:  54 year old who has had multiple recent falls and presents with back pain and acute onset of chest pain chest pain which began yesterday associated with coughing and generalized weakness. Chronic bilateral lower quadrant abdominal pain for several weeks. Current history of hepatitis-C, hypertension and COPD.  EXAM: CT CHEST, ABDOMEN AND PELVIS WITHOUT CONTRAST  TECHNIQUE: Multidetector CT imaging of the chest, abdomen and pelvis was performed following the standard protocol without IV contrast.  COMPARISON:  None.  FINDINGS: CT CHEST FINDINGS  Lungs: Calcified granuloma in the peripheral right upper lobe. No noncalcified pulmonary parenchymal nodules or masses. Mild passive atelectasis in the right lower lobe. Linear atelectasis in the superior segment right lower lobe and in the lingula. Lungs otherwise clear without airspace consolidation or interstitial lung disease.  Trachea/bronchi: Central airways patent without significant bronchial wall thickening.  Pleura:  Small right pleural effusion.  No left pleural effusion.  Mediastinum: No mediastinal masses. Minimal residual thymic tissue in the anterior superior mediastinum.  Heart and Vascular: Heart size upper normal with moderate LAD coronary atherosclerosis and evidence of left ventricular hypertrophy. No pericardial effusion. Mild to moderate atherosclerosis involving the thoracic aorta without aneurysm or dissection. Widely patent proximal great vessels.  Lymphatic: No pathologic mediastinal, hilar or axillary  lymphadenopathy.  Other findings: Mild bilateral gynecomastia.  Visualized lower neck: Visualized thyroid gland normal in size and appearance without nodularity. No supraclavicular lymphadenopathy.  Visualized upper abdomen: See report of CT abdomen and pelvis below.  Musculoskeletal: Acute fractures involving the right posterior 10th, 11th and 12th ribs. Old healed fracture involving the right posterior 8th rib. Subacute fractures with early healing involving the anterior left 3rd, 5th, 6th, 7th, and 8th ribs. No fractures involving the thoracic spine.  CT ABDOMEN AND PELVIS FINDINGS  Hepatobiliary: Mild hepatomegaly  with relative enlargement of the left lobe and caudate lobe compared to the right lobe. Mildly irregular hepatic contour. No focal hepatic parenchymal abnormality. Normal-appearing gallbladder without calcified gallstones. No biliary ductal dilation.  Spleen:  Normal in size and appearance.  Pancreas:  Normal in appearance.  No pancreatic ductal dilation.  Adrenal glands:  Normal in appearance.  Genitourinary: Both kidneys normal in size and appearance. No evidence of urinary tract calculi. Normal-appearing decompressed urinary bladder.  Upper normal sized prostate gland, particularly the median lobe. Normal seminal vesicles.  Gastrointestinal: Stomach normal in appearance, filled with food. Wall thickening and luminal narrowing involving a several cm segment of the distal and terminal ileum, without evidence of edema or inflammation in the surrounding fat. Remaining small bowel unremarkable. He elongated sigmoid colon extending well above the umbilicus in the abdominal midline. Moderate stool burden with both liquid and solid stool. No evidence of diverticulosis and no focal abnormality involving the colon. Cecum extends lobe in the right side of the pelvis. Normal-appearing long appendix identified in the right lower pelvis extending upward into the upper pelvis near the midline.  Ascites:  Absent.   Vascular: Severe aortoiliofemoral atherosclerosis without aneurysm. Visceral arteries patent.  Lymphatic:  No pathologic lymphadenopathy in the abdomen or pelvis.  Other findings: Edema or ecchymosis involving the subcutaneous fat of the right flank.  Musculoskeletal: Severe degenerative disc disease and spondylosis at L3-4, L4-5 and L5-S1 with facet degenerative changes at these levels. No fractures involving the lumbosacral spine or the pelvis.  Visualized lower thorax: See above report for the CT chest.  IMPRESSION: 1. Small right pleural effusion and mild passive atelectasis involving the right lower lobe. Linear atelectasis involving the superior segment right lower lobe and lingula. No acute cardiopulmonary disease otherwise. 2. Very small calcified granuloma in the posterior right upper lobe. 3. Hypertrophy and moderate LAD coronary atherosclerosis. 4. Mild bilateral gynecomastia. 5. No acute abnormalities involving the abdomen or pelvis. 6. Hepatic cirrhosis.  No focal hepatic parenchymal abnormality. 7. Wall thickening involving a several cm segment of the distal and terminal ileum with luminal narrowing but no evidence of bowel obstruction. As there is no associated edema or inflammation in the surrounding fat, this likely represents chronic inflammation. 8. Multiple bilateral rib fractures of differing stages of healing, including acute fractures involving the posterior right tenth, eleventh and twelfth ribs. Other osseous findings as above. 9. Generalized atherosclerosis as described above, advanced for age.   Electronically Signed   By: Hulan Saas M.D.   On: 10/11/2014 18:30   Ct Cervical Spine Wo Contrast  10/31/2014   CLINICAL DATA:  Unwitnessed fall  EXAM: CT HEAD WITHOUT CONTRAST  CT CERVICAL SPINE WITHOUT CONTRAST  TECHNIQUE: Multidetector CT imaging of the head and cervical spine was performed following the standard protocol without intravenous contrast. Multiplanar CT image  reconstructions of the cervical spine were also generated.  COMPARISON:  10/08/2014, 08/22/14  FINDINGS: CT HEAD FINDINGS  Bony calvarium is intact. Diffuse atrophic changes are seen. There are findings consistent with encephalomalacia in the frontal areas bilaterally. This is stable from the prior exam. No acute hemorrhage or acute infarct is identified. No space-occupying mass lesion is noted.  CT CERVICAL SPINE FINDINGS  Seven cervical segments are well visualized. Vertebral body height is well maintained. Osteophytic changes are noted anteriorly at C4-5, C5-6 and C6-7. Mild facet hypertrophic changes are noted. No acute fracture or acute facet abnormality is seen. No gross soft tissue abnormality is seen. Carotid calcifications are  noted.  IMPRESSION: CT of the head: Chronic ischemic/traumatic changes without acute abnormality.  CT of the cervical spine: Degenerative change without acute abnormality.   Electronically Signed   By: Alcide Clever M.D.   On: 10/31/2014 21:07   Mr Brain Wo Contrast  11/03/2014   CLINICAL DATA:  Chronic seizure disorder. Recent hospitalization after fall [place]. Acute metabolic encephalopathy.  EXAM: MRI HEAD WITHOUT CONTRAST  TECHNIQUE: Multiplanar, multiecho pulse sequences of the brain and surrounding structures were obtained without intravenous contrast.  COMPARISON:  Head CT 10/31/2014  FINDINGS: The study suffers from some motion degradation but is sufficiently diagnostic. Diffusion imaging does not show any acute or subacute infarction. The brain shows generalized atrophy. No focal abnormality affects the brainstem or cerebellum. Within the cerebral hemispheres, there is bifrontal atrophy and gliosis, most commonly seen as a sequela of previous head trauma. Lesser changes also affect the temporal tips (left more than right), also typical of previous trauma. No evidence of ischemic infarctions. No mass lesion, acute hemorrhage, hydrocephalus or extra-axial collection. There are  small foci of residual hemosiderin deposition in the frontal and temporal regions probably related to previous head trauma. No mesial temporal lesion or asymmetry. No pituitary mass. No inflammatory sinus disease. No skull or skullbase lesion.  IMPRESSION: No acute or reversible finding. Generalized brain atrophy. Bifrontal atrophy and gliosis presumably related to distant head trauma. Lesser changes of atrophy and gliosis affect the temporal tips as well.   Electronically Signed   By: Paulina Fusi M.D.   On: 11/03/2014 16:19   Ct Abdomen Pelvis W Contrast  10/31/2014   CLINICAL DATA:  Seizure, elbow pain, probable fall  EXAM: CT CHEST, ABDOMEN, AND PELVIS WITH CONTRAST  TECHNIQUE: Multidetector CT imaging of the chest, abdomen and pelvis was performed following the standard protocol during bolus administration of intravenous contrast.  CONTRAST:  1 OMNIPAQUE IOHEXOL 300 MG/ML  SOLN  COMPARISON:  10/11/2014  FINDINGS: CT CHEST FINDINGS  Sagittal images of the spine shows no acute fractures. Sagittal view of the sternum is unremarkable. No scapular fracture. No definite clavicle fracture.  Atherosclerotic calcifications of thoracic aorta. Heart size within normal limits. No pericardial effusion.  At least 3 or 4 healing fractures are noted in right posterior lower ribs. Healing fracture in left posterior rib noted in axial image 56. Three there is nondisplaced acute fracture of the right 12 rib. See axial image 65. Some pleural thickening noted in right posterior hemithorax.  There is no mediastinal hematoma or adenopathy. No pericardial effusion.  No lung contusion or pneumothorax. Mild atelectasis noted in right lower lobe posteriorly. There is no pneumothorax.  CT ABDOMEN AND PELVIS FINDINGS  Sagittal images of the lumbar spine shows significant disc space flattening with vacuum disc phenomenon at L4-L5 and L5-S1 level. Large anterior osteophytes and moderate disc space flattening at L3-L4 level.  Atherosclerotic calcifications of abdominal aorta and iliac arteries. Enhanced liver is unremarkable. No calcified gallstones are noted within gallbladder. Enhanced pancreas, spleen and adrenal glands are unremarkable. Degenerative changes bilateral SI joints. No sacral fracture is noted.  There is no evidence of urinary bladder injury. No pelvic fractures are noted. No inguinal adenopathy. Enhanced kidneys are symmetrical in size. No hydronephrosis or hydroureter. Delayed renal images shows bilateral renal symmetrical excretion.  No pericecal inflammation. The terminal ileum is unremarkable. Normal appendix.  Coronal images shows bilateral hip joint symmetrical in appearance. No hip fractures.  IMPRESSION: 1. There are bilateral posterior healing fractures as described above. Question  tiny nondisplaced acute fracture of the right twelfth rib. 2. No lung contusion or pneumothorax. 3. No mediastinal hematoma or adenopathy. 4. Degenerative changes bilateral SI joints. Degenerative changes lumbar spine. 5. No acute visceral injury within abdomen or pelvis. 6. No pericecal inflammation.  Normal appendix. 7. No evidence of urinary bladder injury. 8. No acute fractures are noted within abdomen and pelvis. Degenerative changes lumbar spine. 9.   Electronically Signed   By: Natasha Mead M.D.   On: 10/31/2014 21:10   Ct Abdomen Pelvis W Contrast  10/11/2014   CLINICAL DATA:  54 year old who has had multiple recent falls and presents with back pain and acute onset of chest pain chest pain which began yesterday associated with coughing and generalized weakness. Chronic bilateral lower quadrant abdominal pain for several weeks. Current history of hepatitis-C, hypertension and COPD.  EXAM: CT CHEST, ABDOMEN AND PELVIS WITHOUT CONTRAST  TECHNIQUE: Multidetector CT imaging of the chest, abdomen and pelvis was performed following the standard protocol without IV contrast.  COMPARISON:  None.  FINDINGS: CT CHEST FINDINGS  Lungs:  Calcified granuloma in the peripheral right upper lobe. No noncalcified pulmonary parenchymal nodules or masses. Mild passive atelectasis in the right lower lobe. Linear atelectasis in the superior segment right lower lobe and in the lingula. Lungs otherwise clear without airspace consolidation or interstitial lung disease.  Trachea/bronchi: Central airways patent without significant bronchial wall thickening.  Pleura:  Small right pleural effusion.  No left pleural effusion.  Mediastinum: No mediastinal masses. Minimal residual thymic tissue in the anterior superior mediastinum.  Heart and Vascular: Heart size upper normal with moderate LAD coronary atherosclerosis and evidence of left ventricular hypertrophy. No pericardial effusion. Mild to moderate atherosclerosis involving the thoracic aorta without aneurysm or dissection. Widely patent proximal great vessels.  Lymphatic: No pathologic mediastinal, hilar or axillary lymphadenopathy.  Other findings: Mild bilateral gynecomastia.  Visualized lower neck: Visualized thyroid gland normal in size and appearance without nodularity. No supraclavicular lymphadenopathy.  Visualized upper abdomen: See report of CT abdomen and pelvis below.  Musculoskeletal: Acute fractures involving the right posterior 10th, 11th and 12th ribs. Old healed fracture involving the right posterior 8th rib. Subacute fractures with early healing involving the anterior left 3rd, 5th, 6th, 7th, and 8th ribs. No fractures involving the thoracic spine.  CT ABDOMEN AND PELVIS FINDINGS  Hepatobiliary: Mild hepatomegaly with relative enlargement of the left lobe and caudate lobe compared to the right lobe. Mildly irregular hepatic contour. No focal hepatic parenchymal abnormality. Normal-appearing gallbladder without calcified gallstones. No biliary ductal dilation.  Spleen:  Normal in size and appearance.  Pancreas:  Normal in appearance.  No pancreatic ductal dilation.  Adrenal glands:  Normal in  appearance.  Genitourinary: Both kidneys normal in size and appearance. No evidence of urinary tract calculi. Normal-appearing decompressed urinary bladder.  Upper normal sized prostate gland, particularly the median lobe. Normal seminal vesicles.  Gastrointestinal: Stomach normal in appearance, filled with food. Wall thickening and luminal narrowing involving a several cm segment of the distal and terminal ileum, without evidence of edema or inflammation in the surrounding fat. Remaining small bowel unremarkable. He elongated sigmoid colon extending well above the umbilicus in the abdominal midline. Moderate stool burden with both liquid and solid stool. No evidence of diverticulosis and no focal abnormality involving the colon. Cecum extends lobe in the right side of the pelvis. Normal-appearing long appendix identified in the right lower pelvis extending upward into the upper pelvis near the midline.  Ascites:  Absent.  Vascular: Severe aortoiliofemoral atherosclerosis without aneurysm. Visceral arteries patent.  Lymphatic:  No pathologic lymphadenopathy in the abdomen or pelvis.  Other findings: Edema or ecchymosis involving the subcutaneous fat of the right flank.  Musculoskeletal: Severe degenerative disc disease and spondylosis at L3-4, L4-5 and L5-S1 with facet degenerative changes at these levels. No fractures involving the lumbosacral spine or the pelvis.  Visualized lower thorax: See above report for the CT chest.  IMPRESSION: 1. Small right pleural effusion and mild passive atelectasis involving the right lower lobe. Linear atelectasis involving the superior segment right lower lobe and lingula. No acute cardiopulmonary disease otherwise. 2. Very small calcified granuloma in the posterior right upper lobe. 3. Hypertrophy and moderate LAD coronary atherosclerosis. 4. Mild bilateral gynecomastia. 5. No acute abnormalities involving the abdomen or pelvis. 6. Hepatic cirrhosis.  No focal hepatic parenchymal  abnormality. 7. Wall thickening involving a several cm segment of the distal and terminal ileum with luminal narrowing but no evidence of bowel obstruction. As there is no associated edema or inflammation in the surrounding fat, this likely represents chronic inflammation. 8. Multiple bilateral rib fractures of differing stages of healing, including acute fractures involving the posterior right tenth, eleventh and twelfth ribs. Other osseous findings as above. 9. Generalized atherosclerosis as described above, advanced for age.   Electronically Signed   By: Hulan Saas M.D.   On: 10/11/2014 18:30   Dg Chest Port 1 View  10/16/2014   CLINICAL DATA:  One day history of fever.  Cough and congestion.  EXAM: PORTABLE CHEST - 1 VIEW  COMPARISON:  Chest radiograph October 09, 2014 and chest CT October 11, 2014  FINDINGS: There is no edema or consolidation. The heart size and pulmonary vascularity are normal. No adenopathy. There is evidence of old rib trauma on the right.  IMPRESSION: No edema or consolidation.   Electronically Signed   By: Bretta Bang III M.D.   On: 10/16/2014 08:11   Dg Chest Port 1 View  10/09/2014   CLINICAL DATA:  Cough and congestion common no clinical improvement reported by the patient.  EXAM: PORTABLE CHEST - 1 VIEW  COMPARISON:  Chest x-ray of October 08, 2014  FINDINGS: The lungs are adequately inflated. The interstitial markings remain increased but have improved since yesterday's study. There is no pleural effusion. The heart and pulmonary vascularity are normal. There is tortuosity of the ascending and descending thoracic aorta. The bony thorax is unremarkable.  IMPRESSION: Mild interval decrease in the conspicuity of the pulmonary interstitium may reflect resolving interstitial edema or pneumonia. When the patient can tolerate the procedure, a PA and lateral chest x-ray with deep inspiration would be useful.   Electronically Signed   By: David  [place] M.D.   On: 10/09/2014 08:38    Dg Abd 2 Views  10/10/2014   CLINICAL DATA:  Acute onset of vomiting.  Initial encounter.  EXAM: ABDOMEN - 2 VIEW  COMPARISON:  MRI of the lumbar spine performed 01/23/2014  FINDINGS: The visualized bowel gas pattern is unremarkable. The colon is largely filled with air; no abnormal dilatation of small bowel loops is seen to suggest small bowel obstruction. No free intra-abdominal air is identified on the provided upright view.  The visualized osseous structures are within normal limits; the sacroiliac joints are unremarkable in appearance. Scattered atelectasis is noted at the lung bases.  IMPRESSION: 1. Unremarkable bowel gas pattern. Colon largely filled with air; no evidence for bowel obstruction. No free intra-abdominal air seen. 2. Scattered  atelectasis at the lung bases.   Electronically Signed   By: Roanna Raider M.D.   On: 10/10/2014 04:27    Microbiology: Recent Results (from the past 240 hour(s))  Culture, blood (routine x 2)     Status: None (Preliminary result)   Collection Time: 11/01/14  2:03 PM  Result Value Ref Range Status   Specimen Description BLOOD RIGHT ANTECUBITAL  Final   Special Requests BOTTLES DRAWN AEROBIC AND ANAEROBIC 10CC  Final   Culture   Final    NO GROWTH 3 DAYS Performed at [facility]    Report Status PENDING  Incomplete  Culture, blood (routine x 2)     Status: None (Preliminary result)   Collection Time: 11/01/14  2:07 PM  Result Value Ref Range Status   Specimen Description BLOOD RIGHT FOREARM  Final   Special Requests BOTTLES DRAWN AEROBIC AND ANAEROBIC 10CC  Final   Culture   Final    NO GROWTH 3 DAYS Performed at [facility]    Report Status PENDING  Incomplete     Labs: Basic Metabolic Panel:  Recent Labs Lab 10/31/14 1840 11/01/14 0550 11/02/14 0716  NA 122* 129* 135  K 3.8 3.7 4.4  CL 88* 95* 102  CO2 24 28 26   GLUCOSE 83 94 112*  BUN <5* 6 8  CREATININE 0.59* 0.60* 0.80  CALCIUM 8.3* 8.0* 8.5*   Liver  Function Tests:  Recent Labs Lab 10/31/14 1840  AST 25  ALT 19  ALKPHOS 88  BILITOT 0.4  PROT 7.1  ALBUMIN 3.4*   No results for input(s): LIPASE, AMYLASE in the last 168 hours. No results for input(s): AMMONIA in the last 168 hours. CBC:  Recent Labs Lab 10/31/14 1840 11/01/14 0550  WBC 5.3 4.5  NEUTROABS 2.6  --   HGB 12.5* 11.7*  HCT 34.3* 33.1*  MCV 93.7 95.1  PLT 238 202   Cardiac Enzymes: No results for input(s): CKTOTAL, CKMB, CKMBINDEX, TROPONINI in the last 168 hours. BNP: BNP (last 3 results)  Recent Labs  10/14/14 1825  BNP 64.4    ProBNP (last 3 results) No results for input(s): PROBNP in the last 8760 hours.  CBG: No results for input(s): GLUCAP in the last 168 hours.     SignedRhetta Mura  [facility] 11/05/2014, 10:02 AM

## 2014-11-05 NOTE — Evaluation (Signed)
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 Pt currently with functional limitations due to the deficits listed below (see PT Problem List).  Pt will benefit from skilled PT to increase their independence and safety with mobility to allow discharge to the venue listed below.   Pt very unsteady and presents with LE weakness during testing.  Pt HIGH fall risk and agreeable to SNF at this time.     Follow Up Recommendations SNF    Equipment Recommendations  None recommended by PT    Recommendations for Other Services       Precautions / Restrictions Precautions Precautions: Fall Restrictions Weight Bearing Restrictions: Yes Other Position/Activity Restrictions: L ulnar fx - No restrictions per orders however instructed pt to maintain NWB however pt does not follow      Mobility  Bed Mobility Overal bed mobility: Modified Independent                Transfers Overall transfer level: Needs assistance Equipment used: Rolling walker (2 wheeled) Transfers: Sit to/from Stand Sit to Stand: Min assist         General transfer comment: assist for steadying and safety, increased reliance on UEs for support, cued for NWB L UE however pt does not follow  Ambulation/Gait Ambulation/Gait assistance: Min assist Ambulation Distance (Feet): 15 Feet Assistive device: 1 person hand held assist Gait  Pattern/deviations: Step-through pattern;Trunk flexed;Narrow base of support;Decreased stride length     General Gait Details: cues for safety and posture, declined assistive device however agreed to [facility] for steadying, only agreeable to ambulate to/from bathroom, presents as high fall risk  Stairs            Wheelchair Mobility    Modified Rankin (Stroke Patients Only)       Balance           Standing balance support: Single extremity supported Standing balance-[name]: Poor                               Pertinent Vitals/Pain Pain Assessment: Faces Faces Pain Scale: Hurts a little bit Pain Location: L arm Pain Descriptors / Indicators: Sore Pain Intervention(s): Limited activity within patient's tolerance;Monitored during session;Repositioned    Home Living Family/patient expects to be discharged to:: Private residence Living Arrangements: Alone   Type of Home: Apartment Home Access: Stairs to enter   Secretary/administrator of Steps: 7 Home Layout: One level Home Equipment: Cane - single point      Prior Function Level of Independence: Independent with assistive device(s)               Hand Dominance        Extremity/Trunk Assessment   Upper Extremity Assessment: LUE deficits/detail       LUE Deficits / Details: recent hx of L ulnar fx, did not test   Lower Extremity Assessment: Generalized weakness (grossly 3/5 throughout)         Communication   Communication: No difficulties  Cognition Arousal/Alertness: Awake/alert Behavior During Therapy: WFL for tasks assessed/performed Overall Cognitive Status: No family/caregiver present to determine baseline cognitive functioning Area of Impairment: Safety/judgement         Safety/Judgement: Decreased awareness of safety;Decreased awareness of deficits          General Comments      Exercises        Assessment/Plan    PT Assessment Patient needs continued PT  services  PT Diagnosis Difficulty walking   PT Problem List Decreased strength;Decreased activity tolerance;Decreased balance;Decreased mobility;Decreased knowledge of use of DME;Decreased safety awareness;Pain;Decreased cognition  PT Treatment Interventions DME instruction;Gait training;Functional mobility training;Therapeutic activities;Therapeutic exercise;Cognitive remediation;Patient/family education;Balance training   PT Goals (Current goals can be found in the Care Plan section) Acute Rehab PT Goals PT Goal Formulation: With patient Time For Goal Achievement: 11/12/14 Potential to Achieve Goals: Fair    Frequency Min 3X/week   Barriers to discharge        Co-evaluation               End of Session   Activity Tolerance: Patient limited by fatigue Patient left: in bed;with call bell/phone within reach;with nursing/sitter in room;with bed alarm set           Time: 1435-1453 PT Time Calculation (min) (ACUTE ONLY): 18 min   Charges:   PT Evaluation $Initial PT Evaluation Tier I: 1 Procedure     PT G Codes:        Jowell Bossi,[name] 11/05/2014, 4:21 PM Zenovia Jarred, PT, DPT 11/05/2014 Pager: [phone]

## 2014-11-05 NOTE — Progress Notes (Signed)
PT Cancellation Note  Patient Details Name: [name] [name] MRN: [id number] DOB: [birth year]   Cancelled Treatment:    Reason Eval/Treat Not Completed: PT screened, no needs identified, will sign off Pt with discharge orders.  No needs per RN.  PT to sign off.   Baley Lorimer,[name] 11/05/2014, 10:43 AM Zenovia Jarred, PT, DPT 11/05/2014 Pager: [phone]

## 2014-11-05 NOTE — Progress Notes (Signed)
CSW consulted for disposition planning.  Pt was planned to go home, but evaluated by PT this afternoon and recommendation for SNF. Pt is now agreeable to SNF for rehab.  CSW met with pt and pt friend, Suanne Marker at bedside. CSW introduced self and explained role. CSW discussed recommendation for SNF placement. Pt agreeable to SNF placement. CSW explained to pt that with pt being medicaid only that pt will have to remain in the facility for 30 days and may have to sign over his SSI check and pt agreeable to these terms. CSW explained to pt and pt friend that facilities are limited that accept Medicaid only and CSW cannot guarantee placement in [facility], but [facility] will completed a [place] SNF search. Pt agreeable. Pt friend, Suanne Marker hopeful that pt will be able to be placed in [facility] as she is a Marine scientist in [place] and pt has a Education officer, museum through [employer] and they are pt primary support system.   Pt had sitter for safety today, but has now been discontinued and pt aware that he needs to use call bell to ask for assistance.   CSW completed FL2 and initiated SNF search to [facility] and surrounding counties.   CSW to follow up with pt friend, Suanne Marker regarding SNF bed offers in order for pt friend, Suanne Marker to assist pt with decision and pt agreeable to this.   CSW to continue to follow to provide support and assist with pt disposition needs.   Alison Murray, MSW, [facility] [phone]

## 2014-11-05 NOTE — Clinical Social Work Placement (Signed)
   CLINICAL SOCIAL WORK PLACEMENT  NOTE  Date:  11/05/2014  Patient Details  Name: Atharva Mirsky MRN: [id number] Date of Birth: Jun 13, 1960  Clinical Social Work is seeking post-discharge placement for this patient at the Skilled  Nursing Facility level of care (*CSW will initial, date and re-position this form in  chart as items are completed):  No (CSW explained that list will be provided once offer available as facilities that accept pt as Medicaid only are limited)   Patient/family provided with [facility] Clinical Social Work Department's list of facilities offering this level of care within the geographic area requested by the patient (or if unable, by the patient's family).  Yes   Patient/family informed of their freedom to choose among providers that offer the needed level of care, that participate in Medicare, Medicaid or managed care program needed by the patient, have an available bed and are willing to accept the patient.  No   Patient/family informed of [facility]'s ownership interest in [facility] and [facility], as well as of the fact that they are under no obligation to receive care at these facilities.  PASRR submitted to EDS on       PASRR number received on       Existing PASRR number confirmed on 11/05/14     FL2 transmitted to all facilities in geographic area requested by pt/family on 11/05/14     FL2 transmitted to all facilities within larger geographic area on 11/05/14     Patient informed that his/her managed care company has contracts with or will negotiate with certain facilities, including the following:            Patient/family informed of bed offers received.  Patient chooses bed at       Physician recommends and patient chooses bed at      Patient to be transferred to   on  .  Patient to be transferred to facility by       Patient family notified on   of transfer.  Name of family member notified:        PHYSICIAN Please sign  FL2     Additional Comment:    _______________________________________________ Orson Eva, LCSW 11/05/2014, 4:54 PM

## 2014-11-06 LAB — CULTURE, BLOOD (ROUTINE X 2)
CULTURE: NO GROWTH
Culture: NO GROWTH

## 2014-11-06 MED ORDER — IBUPROFEN 200 MG PO TABS
600.0000 mg | ORAL_TABLET | Freq: Once | ORAL | Status: AC
  Administered 2014-11-06: 600 mg via ORAL
  Filled 2014-11-06: qty 3

## 2014-11-06 NOTE — Progress Notes (Signed)
Clinical Social Work  CSW spoke with Tammy from [facility] who reports administrator is reviewing information and will contact CSW once they have determined if they can accept him. CSW will continue to follow.  [place], [place] [phone]

## 2014-11-06 NOTE — Progress Notes (Signed)
Clinical Social Work  Patient accepted to [facility] with letter of guarantee. CSW faxed LOG form to [employer] operational officer. CSW made patient aware and RN had informed friend Bjorn Loser). CSW left a message with Bjorn Loser as well in case she has any questions. CSW prepared DC packet with FL2 and DC summary included. RN aware of plans and will call report. PTAR scheduled for 6 pm pick up.  PTAR request #: [id number].  CSW is signing off but available if needed.  [place], [place] [phone]

## 2014-11-06 NOTE — Clinical Social Work Placement (Signed)
   CLINICAL SOCIAL WORK PLACEMENT  NOTE  Date:  11/06/2014  Patient Details  Name: Amoni Morales MRN: [id number] Date of Birth: Sep 18, 1960  Clinical Social Work is seeking post-discharge placement for this patient at the Skilled  Nursing Facility level of care (*CSW will initial, date and re-position this form in  chart as items are completed):  No (CSW explained that list will be provided once offer available as facilities that accept pt as Medicaid only are limited)   Patient/family provided with [facility] Clinical Social Work Department's list of facilities offering this level of care within the geographic area requested by the patient (or if unable, by the patient's family).  Yes   Patient/family informed of their freedom to choose among providers that offer the needed level of care, that participate in Medicare, Medicaid or managed care program needed by the patient, have an available bed and are willing to accept the patient.  No   Patient/family informed of [facility]'s ownership interest in [facility] and [facility], as well as of the fact that they are under no obligation to receive care at these facilities.  PASRR submitted to EDS on       PASRR number received on       Existing PASRR number confirmed on 11/05/14     FL2 transmitted to all facilities in geographic area requested by pt/family on 11/05/14     FL2 transmitted to all facilities within larger geographic area on 11/05/14     Patient informed that his/her managed care company has contracts with or will negotiate with certain facilities, including the following:        Yes   Patient/family informed of bed offers received.  Patient chooses bed at [facility]     Physician recommends and patient chooses bed at      Patient to be transferred to [facility] on 11/06/14.  Patient to be transferred to facility by PTAR     Patient family notified on 11/06/14  of transfer.  Name of family member notified:  Rhonda-friend     PHYSICIAN Please sign FL2     Additional Comment:    _______________________________________________ Marnee Spring, LCSW 11/06/2014, 1:29 PM

## 2014-11-06 NOTE — Discharge Summary (Signed)
No changes Sleeping comfy Doesn't wish to be disturbed D/c SNF today  Pleas Koch, MD [facility] [phone]

## 2014-11-07 ENCOUNTER — Non-Acute Institutional Stay (SKILLED_NURSING_FACILITY): Payer: Medicaid Other | Admitting: Adult Health

## 2014-11-07 DIAGNOSIS — F0391 Unspecified dementia with behavioral disturbance: Secondary | ICD-10-CM

## 2014-11-07 DIAGNOSIS — B9562 Methicillin resistant Staphylococcus aureus infection as the cause of diseases classified elsewhere: Secondary | ICD-10-CM

## 2014-11-07 DIAGNOSIS — R569 Unspecified convulsions: Secondary | ICD-10-CM

## 2014-11-07 DIAGNOSIS — R7881 Bacteremia: Secondary | ICD-10-CM | POA: Diagnosis not present

## 2014-11-07 DIAGNOSIS — G609 Hereditary and idiopathic neuropathy, unspecified: Secondary | ICD-10-CM | POA: Diagnosis not present

## 2014-11-07 DIAGNOSIS — F17200 Nicotine dependence, unspecified, uncomplicated: Secondary | ICD-10-CM | POA: Diagnosis not present

## 2014-11-07 DIAGNOSIS — I10 Essential (primary) hypertension: Secondary | ICD-10-CM | POA: Diagnosis not present

## 2014-11-07 DIAGNOSIS — S52202K Unspecified fracture of shaft of left ulna, subsequent encounter for closed fracture with nonunion: Secondary | ICD-10-CM

## 2014-11-07 DIAGNOSIS — F03918 Unspecified dementia, unspecified severity, with other behavioral disturbance: Secondary | ICD-10-CM

## 2014-11-10 ENCOUNTER — Encounter: Payer: Self-pay | Admitting: Adult Health

## 2014-11-10 NOTE — Progress Notes (Signed)
Patient ID: [name] [name], male   DOB: 1961/01/03, 54 y.o.   MRN: [id number]   Facility: [facility]      No Known Allergies  Chief Complaint  Patient presents with  . Hospitalization Follow-up    HPI:  He has been hospitalized status post a fall and mrsa bacteremia. He did leave hospital ama. He was rehospitalized with hyponatremia related to siadh due to tegretol. This medication has been stopped; and he is now on keppra. He is to be on zyvox through 11-16-14. He is status post left ulnar fracture from a fall he has a wound on that elbow. He here for short term rehab with his goal to return back home.    Past Medical History  Diagnosis Date  . Seizure   . ETOHism   . Aneurysm   . Hypertension   . Hepatitis C   . Hereditary and idiopathic peripheral neuropathy 06/03/2014    Past Surgical History  Procedure Laterality Date  . Cerebral aneurysm repair      At [facility]  . I&d extremity Left 10/26/2012    Procedure: IRRIGATION AND DEBRIDEMENT Left Elbow;  Surgeon: Sheral Apley, MD;  Location: [facility] OR;  Service: Orthopedics;  Laterality: Left;  . I&d extremity Left 10/29/2012    Procedure: IRRIGATION AND DEBRIDEMENT EXTREMITY, wound vac change, stimulan beads;  Surgeon: Sheral Apley, MD;  Location: [facility] OR;  Service: Orthopedics;  Laterality: Left;  lateral on bean bag  . Hardware removal Left 10/29/2012    Procedure: HARDWARE REMOVAL;  Surgeon: Sheral Apley, MD;  Location: [facility] OR;  Service: Orthopedics;  Laterality: Left;  . Incision and drainage abscess Left 11/02/2012    Procedure: INCISION AND DRAINAGE ABSCESS;  Surgeon: Sheral Apley, MD;  Location: [facility] OR;  Service: Orthopedics;  Laterality: Left;  . I&d extremity Left 11/02/2012    Procedure: IRRIGATION AND DEBRIDEMENT EXTREMITY;  Surgeon: Sheral Apley, MD;  Location: [facility] OR;  Service: Orthopedics;  Laterality: Left;    VITAL SIGNS BP 137/68 mmHg  Pulse 79  Ht 6\' 6"  (1.981 m)  Wt 195 lb  (88.451 kg)  BMI 22.54 kg/m2  Patient's Medications  New Prescriptions   No medications on file  Previous Medications   FOLIC ACID (FOLVITE) 1 MG TABLET    Take 1 tablet (1 mg total) by mouth daily.   LEVETIRACETAM (KEPPRA) 750 MG TABLET    Take 2 tablets (1,500 mg total) by mouth 2 (two) times daily.   LINEZOLID (ZYVOX) 600 MG TABLET    Take 1 tablet (600 mg total) by mouth every 12 (twelve) hours.   METOPROLOL (LOPRESSOR) 50 MG TABLET    Take 0.5 tablets (25 mg total) by mouth 2 (two) times daily.   NICOTINE (NICODERM CQ - DOSED IN MG/24 HOURS) 21 MG/24HR PATCH    Place 1 patch (21 mg total) onto the skin daily.   PREGABALIN (LYRICA) 50 MG CAPSULE    Take 2 tablets in the morning, 1 tablet at noon and 2 tablets at bedtime.  Modified Medications   No medications on file  Discontinued Medications   No medications on file     SIGNIFICANT DIAGNOSTIC EXAMS  10-19-14: - Left ventricle: The cavity size was normal. There was mild concentric hypertrophy. Systolic function was normal. The estimated ejection fraction was in the range of 55% to 60%. Doppler parameters are consistent with high ventricular filling pressure. 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 - Right atrium: The  atrium was mildly dilated. - Atrial septum: No defect or patent foramen ovale was identified.   10-31-14: pelvic x-ray: Normal pelvis  10-31-14: left elbow x-ray: Continued presence of proximal ulnar fracture with intra-articular extension.  10-31-14: right rib x-ray: Mildly displaced right tenth, eleventh and twelfth rib fractures. No acute cardiopulmonary abnormality is noted  10-31-14: ct of head and cervical spine: CT of the head: Chronic ischemic/traumatic changes without acute abnormality. CT of the cervical spine: Degenerative change without acute abnormality  10-31-14: ct of chest; abdomen and pelvis: 1. There are bilateral posterior healing fractures as described above. Question tiny nondisplaced acute  fracture of the right twelfth rib. 2. No lung contusion or pneumothorax. 3. No mediastinal hematoma or adenopathy. 4. Degenerative changes bilateral SI joints. Degenerative changes lumbar spine. 5. No acute visceral injury within abdomen or pelvis. 6. No pericecal inflammation.  Normal appendix. 7. No evidence of urinary bladder injury. 8. No acute fractures are noted within abdomen and pelvis. Degenerative changes lumbar spine.  10-31-14: mri of brain: No acute or reversible finding. Generalized brain atrophy. Bifrontal atrophy and gliosis presumably related to distant head trauma. Lesser changes of atrophy and gliosis affect the temporal tips as well  11-04-14: bilateral rib x-ray: 1.  No acute cardiopulmonary disease. 2. Bilateral nonacute rib fractures, greater on the right than left. No definite acute fracture   LABS REVIEWED:   10-31-14: wbc 5.2; hgb 12.5; hct 34.3; mcv 93.7; plt 238; glucose 83; bun <5; creat 0.59; k+3.8; na++122; liver normal albumin 3.4; ethanal 26 11-02-14: glucose 122; bun 8; creat 0.8; k+4.4; na++135        Review of Systems  Constitutional: Negative for appetite change and fatigue.  HENT: Negative for congestion.   Respiratory: Negative for cough, chest tightness and shortness of breath.   Cardiovascular: Negative for chest pain, palpitations and leg swelling.  Gastrointestinal: Negative for nausea, abdominal pain, diarrhea and constipation.  Musculoskeletal: Negative for myalgias and arthralgias.  Skin: Negative for pallor.  Neurological: Negative for dizziness.  Psychiatric/Behavioral: The patient is not nervous/anxious.       Physical Exam  Constitutional: No distress.  Eyes: Conjunctivae are normal.  Neck: Neck supple. No JVD present. No thyromegaly present.  Cardiovascular: Normal rate, regular rhythm and intact distal pulses.   Respiratory: Effort normal and breath sounds normal. No respiratory distress. He has no wheezes.  GI: Soft. Bowel  sounds are normal. He exhibits no distension. There is no tenderness.  Musculoskeletal: He exhibits no edema.  Able to move all extremities   Lymphadenopathy:    He has no cervical adenopathy.  Neurological: He is alert.  Skin: Skin is warm and dry. He is not diaphoretic.  Left elbow wound: bed is pink with film present; no signs of infection present; will use medi-honey   Psychiatric: He has a normal mood and affect.       ASSESSMENT/ PLAN:  1. mrsa bacteremia: will have him complete his zyvox. Will monitor his status. His 2-d echo done 10-19-14 did not demonstrate vegetation present   2. Seizure: no reports of seizure activity present; will continue keppra 1500 mg twice daily; his tegretol was stopped and will monitor  3. Hypertension: will continue lopressor 50 mgm twice daily   4. Smoker: is currently using nicotine patch 21 mg daily.   5. Hereditary and idiopathic peripheral neuropathy: he is without change will continue lyrica 100 mg twice daily and 50 mg in the afternoon will monitor; his pain is not to be  managed through narcotic pain medications  6. Left ulnar fracture: will continue therapy as directed and will follow up with orthopedics as directed will monitor   7.  Dementia with behavioral disturbance: is without change at this time; will not make changes will monitor his status.   8. Hyponatremia: is presently stable; related to siadh from tregretol use this was stopped. His na++ is 135; will monitor  9. Left elbow laceration: will use meda-honey daily to wound and will monitor     Time spent with patient 50   minutes >50% time spent counseling; reviewing medical record; tests; labs; and developing future plan of care   Synthia Innocent NP [facility]  Contact [phone] Monday through Friday 8am- 5pm  After hours call [phone]

## 2014-11-11 ENCOUNTER — Non-Acute Institutional Stay (SKILLED_NURSING_FACILITY): Payer: Medicaid Other | Admitting: Internal Medicine

## 2014-11-11 ENCOUNTER — Ambulatory Visit: Payer: Medicaid Other | Admitting: Neurology

## 2014-11-11 DIAGNOSIS — F0391 Unspecified dementia with behavioral disturbance: Secondary | ICD-10-CM | POA: Diagnosis not present

## 2014-11-11 DIAGNOSIS — R7881 Bacteremia: Secondary | ICD-10-CM | POA: Diagnosis not present

## 2014-11-11 DIAGNOSIS — R569 Unspecified convulsions: Secondary | ICD-10-CM | POA: Diagnosis not present

## 2014-11-11 DIAGNOSIS — F03918 Unspecified dementia, unspecified severity, with other behavioral disturbance: Secondary | ICD-10-CM

## 2014-11-11 DIAGNOSIS — F17200 Nicotine dependence, unspecified, uncomplicated: Secondary | ICD-10-CM | POA: Diagnosis not present

## 2014-11-11 DIAGNOSIS — F1010 Alcohol abuse, uncomplicated: Secondary | ICD-10-CM

## 2014-11-11 DIAGNOSIS — S52202K Unspecified fracture of shaft of left ulna, subsequent encounter for closed fracture with nonunion: Secondary | ICD-10-CM | POA: Diagnosis not present

## 2014-11-11 DIAGNOSIS — B182 Chronic viral hepatitis C: Secondary | ICD-10-CM | POA: Diagnosis not present

## 2014-11-11 DIAGNOSIS — B9562 Methicillin resistant Staphylococcus aureus infection as the cause of diseases classified elsewhere: Secondary | ICD-10-CM

## 2014-11-11 DIAGNOSIS — I10 Essential (primary) hypertension: Secondary | ICD-10-CM

## 2014-11-11 DIAGNOSIS — F22 Delusional disorders: Secondary | ICD-10-CM

## 2014-11-11 DIAGNOSIS — G609 Hereditary and idiopathic neuropathy, unspecified: Secondary | ICD-10-CM

## 2014-11-11 DIAGNOSIS — F1011 Alcohol abuse, in remission: Secondary | ICD-10-CM

## 2014-11-11 NOTE — Progress Notes (Signed)
Patient ID: [name] [name], male   DOB: [birth year], 54 y.o.   MRN: [id number]    HISTORY AND PHYSICAL/DISCHARGE   DATE:  11/11/14  Location:  [place] of Service: SNF [phone])   Extended Emergency Contact Information Primary Emergency Contact: Romeo Rabon [place] of [place] Work Phone: [phone] Mobile Phone: [phone] Relation: Other Secondary Emergency Contact: [place] Phone: [phone] Mobile Phone: [phone] Relation: Other  Advanced Directive information  [place]  Chief Complaint  Patient presents with  . New Admit To SNF  . Discharge Note    HPI:  54 yo male seen today for admission into SNF following hospital stay for hyponatremia due to Tegretol,  MRSA bacteremia, Etoh abuse, seizure d/o, falls, paranoid personality, dementia, HTN, left elbow wound/fx, Hep C with cirrhosis.She was tx with zyvox and will complete tx 8/28th. 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 Na improved 122 ---> 135. He signed out AMA from previous hospital stay. At this admission he was told to f/u with neurology next month. keppra dose was increased and tegretol stopped. Hospital was c/a pt's home situation as it is poor. 2D echo done 10-19-14 did not demonstrate vegetation present  She has no concerns and reports she is ready to go home. She states she had a seizure yesterday. Nursing cannot confirm this revelation. She is a poor historian due to dementia. Hx obtained from chart. She will be d/c'd home with nursing after short term stay  Seizure - stable on keppra. He takes folate  Hypertension - BP stable on lopressor  Tobacco abuse -  currently using nicotine patch  Hereditary and idiopathic peripheral neuropathy - stable on lyrica 100 mg twice daily and 50 mg in the afternoon  Left ulnar fracture - followed by Ortho  Dementia with behavioral disturbance - stable. Takes no meds    Past Medical History    Diagnosis Date  . Seizure   . ETOHism   . Aneurysm   . Hypertension   . Hepatitis C   . Hereditary and idiopathic peripheral neuropathy 06/03/2014    Past Surgical History  Procedure Laterality Date  . Cerebral aneurysm repair      At [facility]  . I&d extremity Left 10/26/2012    Procedure: IRRIGATION AND DEBRIDEMENT Left Elbow;  Surgeon: Renette Butters, MD;  Location: [place];  Service: Orthopedics;  Laterality: Left;  . I&d extremity Left 10/29/2012    Procedure: IRRIGATION AND DEBRIDEMENT EXTREMITY, wound vac change, stimulan beads;  Surgeon: Renette Butters, MD;  Location: [place];  Service: Orthopedics;  Laterality: Left;  lateral on bean bag  . Hardware removal Left 10/29/2012    Procedure: HARDWARE REMOVAL;  Surgeon: Renette Butters, MD;  Location: [place];  Service: Orthopedics;  Laterality: Left;  . Incision and drainage abscess Left 11/02/2012    Procedure: INCISION AND DRAINAGE ABSCESS;  Surgeon: Renette Butters, MD;  Location: [place];  Service: Orthopedics;  Laterality: Left;  . I&d extremity Left 11/02/2012    Procedure: IRRIGATION AND DEBRIDEMENT EXTREMITY;  Surgeon: Renette Butters, MD;  Location: [place];  Service: Orthopedics;  Laterality: Left;    Patient Care Team: Lorayne Marek, MD as PCP - General (Internal Medicine)  Social History   Social History  . Marital Status: Single    Spouse Name: N/A  . Number of Children: 3  . Years of Education: 11 th   Occupational History  . disabled  Social History Main Topics  . Smoking status: Former Smoker    Types: Cigarettes  . Smokeless tobacco: Never Used  . Alcohol Use: No     Comment: quit drinking 11/2011 per patient  . Drug Use: No  . Sexual Activity: Not Currently   Other Topics Concern  . Not on file   Social History Narrative   Patient is single and lives at home alone.   Disabled.   Education 11 th grade.   Right handed.   Caffeine none .     reports that he has quit smoking. His smoking use  included Cigarettes. He has never used smokeless tobacco. He reports that he does not drink alcohol or use illicit drugs.  Family History  Problem Relation Age of Onset  . Hypertension Mother   . Cancer Mother   . Hypertension Father   . Diabetes Sister   . Cancer Sister   . Cancer Sister    Family Status  Relation Status Death Age  . Mother Deceased 85  . Father Deceased   . Sister Deceased   . Son Alive   . Brother Alive   . Sister Deceased   . Sister Alive   . Sister Alive   . Sister Alive     There is no immunization history for the selected administration types on file for this patient.  No Known Allergies  Medications: Patient's Medications  New Prescriptions   No medications on file  Previous Medications   FOLIC ACID (FOLVITE) 1 MG TABLET    Take 1 tablet (1 mg total) by mouth daily.   LEVETIRACETAM (KEPPRA) 750 MG TABLET    Take 2 tablets (1,500 mg total) by mouth 2 (two) times daily.   LINEZOLID (ZYVOX) 600 MG TABLET    Take 1 tablet (600 mg total) by mouth every 12 (twelve) hours.   METOPROLOL (LOPRESSOR) 50 MG TABLET    Take 0.5 tablets (25 mg total) by mouth 2 (two) times daily.   NICOTINE (NICODERM CQ - DOSED IN MG/24 HOURS) 21 MG/24HR PATCH    Place 1 patch (21 mg total) onto the skin daily.   PREGABALIN (LYRICA) 50 MG CAPSULE    Take 2 tablets in the morning, 1 tablet at noon and 2 tablets at bedtime.  Modified Medications   No medications on file  Discontinued Medications   No medications on file    Review of Systems  Unable to perform ROS: Dementia    Filed Vitals:   11/11/14 1935  BP: 137/76  Pulse: 87  Temp: 98.2 F (36.8 C)   There is no weight on file to calculate BMI.  Physical Exam  Constitutional: He appears well-developed.  Frail appearing in NAD  HENT:  Mouth/Throat: Oropharynx is clear and moist.  Eyes: Pupils are equal, round, and reactive to light. No scleral icterus.  Neck: Neck supple. Carotid bruit is not present. No  thyromegaly present.  Cardiovascular: Normal rate, regular rhythm and intact distal pulses.  Exam reveals no gallop and no friction rub.   Murmur (1/6 SEM) heard. no distal LE swelling. No calf TTP  Pulmonary/Chest: Effort normal and breath sounds normal. He has no wheezes. He has no rales. He exhibits no tenderness.  Abdominal:  Pt refused exam  Musculoskeletal: He exhibits edema and tenderness.  Lymphadenopathy:    He has no cervical adenopathy.  Neurological: He is alert.  Skin: Skin is warm and dry. No rash noted.  Left elbow dsg c/d/i  Psychiatric: His  mood appears anxious. He is agitated. Thought content is delusional.     Labs reviewed: Admission on 10/31/2014, Discharged on 11/06/2014  Component Date Value Ref Range Status  . WBC 10/31/2014 5.3  4.0 - 10.5 K/uL Final  . RBC 10/31/2014 3.66* 4.22 - 5.81 MIL/uL Final  . Hemoglobin 10/31/2014 12.5* 13.0 - 17.0 g/dL Final  . HCT 10/31/2014 34.3* 39.0 - 52.0 % Final  . MCV 10/31/2014 93.7  78.0 - 100.0 fL Final  . MCH 10/31/2014 34.2* 26.0 - 34.0 pg Final  . MCHC 10/31/2014 36.4* 30.0 - 36.0 g/dL Final  . RDW 10/31/2014 12.1  11.5 - 15.5 % Final  . Platelets 10/31/2014 238  150 - 400 K/uL Final  . Neutrophils Relative % 10/31/2014 49  43 - 77 % Final  . Neutro Abs 10/31/2014 2.6  1.7 - 7.7 K/uL Final  . Lymphocytes Relative 10/31/2014 42  12 - 46 % Final  . Lymphs Abs 10/31/2014 2.2  0.7 - 4.0 K/uL Final  . Monocytes Relative 10/31/2014 9  3 - 12 % Final  . Monocytes Absolute 10/31/2014 0.5  0.1 - 1.0 K/uL Final  . Eosinophils Relative 10/31/2014 0  0 - 5 % Final  . Eosinophils Absolute 10/31/2014 0.0  0.0 - 0.7 K/uL Final  . Basophils Relative 10/31/2014 0  0 - 1 % Final  . Basophils Absolute 10/31/2014 0.0  0.0 - 0.1 K/uL Final  . Sodium 10/31/2014 122* 135 - 145 mmol/L Final  . Potassium 10/31/2014 3.8  3.5 - 5.1 mmol/L Final  . Chloride 10/31/2014 88* 101 - 111 mmol/L Final  . CO2 10/31/2014 24  22 - 32 mmol/L Final    . Glucose, Bld 10/31/2014 83  65 - 99 mg/dL Final  . BUN 10/31/2014 <5* 6 - 20 mg/dL Final  . Creatinine, Ser 10/31/2014 0.59* 0.61 - 1.24 mg/dL Final  . Calcium 10/31/2014 8.3* 8.9 - 10.3 mg/dL Final  . Total Protein 10/31/2014 7.1  6.5 - 8.1 g/dL Final  . Albumin 10/31/2014 3.4* 3.5 - 5.0 g/dL Final  . AST 10/31/2014 25  15 - 41 U/L Final  . ALT 10/31/2014 19  17 - 63 U/L Final  . Alkaline Phosphatase 10/31/2014 88  38 - 126 U/L Final  . Total Bilirubin 10/31/2014 0.4  0.3 - 1.2 mg/dL Final  . GFR calc non Af Amer 10/31/2014 >60  >60 mL/min Final  . GFR calc Af Amer 10/31/2014 >60  >60 mL/min Final   Comment: (NOTE) The eGFR has been calculated using the CKD EPI equation. This calculation has not been validated in all clinical situations. eGFR's persistently <60 mL/min signify possible Chronic Kidney Disease.   . Anion gap 10/31/2014 10  5 - 15 Final  . Alcohol, Ethyl (B) 10/31/2014 26* <5 mg/dL Final   Comment:        LOWEST DETECTABLE LIMIT FOR SERUM ALCOHOL IS 5 mg/dL FOR MEDICAL PURPOSES ONLY   . Opiates 10/31/2014 NONE DETECTED  NONE DETECTED Final  . Cocaine 10/31/2014 NONE DETECTED  NONE DETECTED Final  . Benzodiazepines 10/31/2014 NONE DETECTED  NONE DETECTED Final  . Amphetamines 10/31/2014 NONE DETECTED  NONE DETECTED Final  . Tetrahydrocannabinol 10/31/2014 NONE DETECTED  NONE DETECTED Final  . Barbiturates 10/31/2014 NONE DETECTED  NONE DETECTED Final   Comment:        DRUG SCREEN FOR MEDICAL PURPOSES ONLY.  IF CONFIRMATION IS NEEDED FOR ANY PURPOSE, NOTIFY LAB WITHIN 5 DAYS.        LOWEST  DETECTABLE LIMITS FOR URINE DRUG SCREEN Drug Class       Cutoff (ng/mL) Amphetamine      1000 Barbiturate      200 Benzodiazepine   403 Tricyclics       754 Opiates          300 Cocaine          300 THC              50   . Carbamazepine Lvl 10/31/2014 14.4* 4.0 - 12.0 ug/mL Final   Performed at [facility]  . Color, Urine 10/31/2014 YELLOW  YELLOW Final  .  APPearance 10/31/2014 CLEAR  CLEAR Final  . Specific Gravity, Urine 10/31/2014 1.007  1.005 - 1.030 Final  . pH 10/31/2014 7.0  5.0 - 8.0 Final  . Glucose, UA 10/31/2014 NEGATIVE  NEGATIVE mg/dL Final  . Hgb urine dipstick 10/31/2014 NEGATIVE  NEGATIVE Final  . Bilirubin Urine 10/31/2014 NEGATIVE  NEGATIVE Final  . Ketones, ur 10/31/2014 NEGATIVE  NEGATIVE mg/dL Final  . Protein, ur 10/31/2014 NEGATIVE  NEGATIVE mg/dL Final  . Urobilinogen, UA 10/31/2014 0.2  0.0 - 1.0 mg/dL Final  . Nitrite 10/31/2014 NEGATIVE  NEGATIVE Final  . Leukocytes, UA 10/31/2014 NEGATIVE  NEGATIVE Final   MICROSCOPIC NOT DONE ON URINES WITH NEGATIVE PROTEIN, BLOOD, LEUKOCYTES, NITRITE, OR GLUCOSE <1000 mg/dL.  . WBC 11/01/2014 4.5  4.0 - 10.5 K/uL Final  . RBC 11/01/2014 3.48* 4.22 - 5.81 MIL/uL Final  . Hemoglobin 11/01/2014 11.7* 13.0 - 17.0 g/dL Final  . HCT 11/01/2014 33.1* 39.0 - 52.0 % Final  . MCV 11/01/2014 95.1  78.0 - 100.0 fL Final  . MCH 11/01/2014 33.6  26.0 - 34.0 pg Final  . MCHC 11/01/2014 35.3  30.0 - 36.0 g/dL Final  . RDW 11/01/2014 12.5  11.5 - 15.5 % Final  . Platelets 11/01/2014 202  150 - 400 K/uL Final  . Sodium 11/01/2014 129* 135 - 145 mmol/L Final   DELTA CHECK NOTED  . Potassium 11/01/2014 3.7  3.5 - 5.1 mmol/L Final  . Chloride 11/01/2014 95* 101 - 111 mmol/L Final  . CO2 11/01/2014 28  22 - 32 mmol/L Final  . Glucose, Bld 11/01/2014 94  65 - 99 mg/dL Final  . BUN 11/01/2014 6  6 - 20 mg/dL Final  . Creatinine, Ser 11/01/2014 0.60* 0.61 - 1.24 mg/dL Final  . Calcium 11/01/2014 8.0* 8.9 - 10.3 mg/dL Final  . GFR calc non Af Amer 11/01/2014 >60  >60 mL/min Final  . GFR calc Af Amer 11/01/2014 >60  >60 mL/min Final   Comment: (NOTE) The eGFR has been calculated using the CKD EPI equation. This calculation has not been validated in all clinical situations. eGFR's persistently <60 mL/min signify possible Chronic Kidney Disease.   . Anion gap 11/01/2014 6  5 - 15 Final  .  Specimen Description 11/01/2014 BLOOD RIGHT ANTECUBITAL   Final  . Special Requests 11/01/2014 BOTTLES DRAWN AEROBIC AND ANAEROBIC 10CC   Final  . Culture 11/01/2014    Final                   Value:NO GROWTH 5 DAYS Performed at [facility]   . Report Status 11/01/2014 11/06/2014 FINAL   Final  . Specimen Description 11/01/2014 BLOOD RIGHT FOREARM   Final  . Special Requests 11/01/2014 BOTTLES DRAWN AEROBIC AND ANAEROBIC 10CC   Final  . Culture 11/01/2014    Final  Value:NO GROWTH 5 DAYS Performed at [facility]   . Report Status 11/01/2014 11/06/2014 FINAL   Final  . Sodium 11/02/2014 135  135 - 145 mmol/L Final  . Potassium 11/02/2014 4.4  3.5 - 5.1 mmol/L Final  . Chloride 11/02/2014 102  101 - 111 mmol/L Final  . CO2 11/02/2014 26  22 - 32 mmol/L Final  . Glucose, Bld 11/02/2014 112* 65 - 99 mg/dL Final  . BUN 11/02/2014 8  6 - 20 mg/dL Final  . Creatinine, Ser 11/02/2014 0.80  0.61 - 1.24 mg/dL Final  . Calcium 11/02/2014 8.5* 8.9 - 10.3 mg/dL Final  . GFR calc non Af Amer 11/02/2014 >60  >60 mL/min Final  . GFR calc Af Amer 11/02/2014 >60  >60 mL/min Final   Comment: (NOTE) The eGFR has been calculated using the CKD EPI equation. This calculation has not been validated in all clinical situations. eGFR's persistently <60 mL/min signify possible Chronic Kidney Disease.   . Anion gap 11/02/2014 7  5 - 15 Final  . Carbamazepine Lvl 11/02/2014 8.6  4.0 - 12.0 ug/mL Final   Performed at [facility] wRfx 11/03/2014 Non Reactive  Non Reactive Final   Comment: (NOTE) Performed At: [facility] [place], [place] [id number] Lindon Romp MD OA:[id number]   . RPR Ser Ql 11/03/2014 Non Reactive  Non Reactive Final   Comment: (NOTE) Performed At: [facility] [place], [place] [id number] Lindon Romp MD DD:[id number]   . Vancomycin Tr 11/04/2014 16  10.0 - 20.0  ug/mL Final  Admission on 10/08/2014, Discharged on 10/20/2014  No results displayed because visit has over 200 results.      Dg Ribs Unilateral W/chest Right  10/31/2014   CLINICAL DATA:  Right-sided rib pain after fall at home.  EXAM: RIGHT RIBS AND CHEST - 3+ VIEW  COMPARISON:  October 16, 2014.  FINDINGS: Mildly displaced fractures are seen involving the posterior portions of the right tenth, eleventh and twelfth ribs. No pneumothorax or pleural effusion is noted.  IMPRESSION: Mildly displaced right tenth, eleventh and twelfth rib fractures. No acute cardiopulmonary abnormality is noted.   Electronically Signed   By: Marijo Conception, M.D.   On: 10/31/2014 19:48   Dg Ribs Bilateral  11/04/2014   CLINICAL DATA:  Fall, secondary to possible seizure. Pain in mid to lower bilateral ribs.  EXAM: BILATERAL RIBS - 3+ VIEW  COMPARISON:  10/31/2014 CT.  FINDINGS: Frontal view of the chest and multiple oblique rib images bilaterally. Midline trachea. Normal heart size with a tortuous thoracic aorta. No pleural effusion or pneumothorax. Clear lungs. Minimal right hemidiaphragm elevation.  Segmental fracture of the right eleventh rib is nonacute. There is also a healing fracture of the posterior medial right twelfth rib. Nonacute fractures of the eighth through tenth posterior right ribs. No definite acute fractures are identified. Healing or healed posterior medial left twelfth rib fracture  IMPRESSION: 1.  No acute cardiopulmonary disease. 2. Bilateral nonacute rib fractures, greater on the right than left. No definite acute fracture.   Electronically Signed   By: Abigail Miyamoto M.D.   On: 11/04/2014 14:46   Dg Pelvis 1-2 Views  10/31/2014   CLINICAL DATA:  Fall at home.  EXAM: PELVIS - 1-2 VIEW  COMPARISON:  None.  FINDINGS: There is no evidence of pelvic fracture or diastasis. No pelvic bone lesions are seen. Sacroiliac and hip joints appear normal.  IMPRESSION: Normal pelvis.   Electronically Signed   By: Marijo Conception, M.D.   On: 10/31/2014 19:53   Dg Elbow Complete Left  10/31/2014   CLINICAL DATA:  Acute left elbow pain after fall at home.  EXAM: LEFT ELBOW - COMPLETE 3+ VIEW  COMPARISON:  October 13, 2014.  FINDINGS: There is continued presence of nondisplaced fracture involving the proximal ulna with intra-articular extension. No abnormal fat pad displacement is noted. Visualized portions of radius and ulna appear normal. Mild degenerative changes are noted.  IMPRESSION: Continued presence of proximal ulnar fracture with intra-articular extension.   Electronically Signed   By: Marijo Conception, M.D.   On: 10/31/2014 19:51   Dg Elbow Complete Left  10/13/2014   CLINICAL DATA:  Fall 3 days ago. Left elbow pain and swelling. Initial encounter.  EXAM: LEFT ELBOW - COMPLETE 3+ VIEW  COMPARISON:  10/29/2012  FINDINGS: Prominent soft tissue swelling is noted. Nondisplaced fracture of the proximal ulna seen with intra-articular extension. No other fractures identified. No evidence of dislocation. Mild degenerative spurring seen mainly involving the proximal ulna.  IMPRESSION: Nondisplaced proximal ulnar fracture with intra-articular extension.   Electronically Signed   By: Earle Gell M.D.   On: 10/13/2014 15:24   Ct Head Wo Contrast  10/31/2014   CLINICAL DATA:  Unwitnessed fall  EXAM: CT HEAD WITHOUT CONTRAST  CT CERVICAL SPINE WITHOUT CONTRAST  TECHNIQUE: Multidetector CT imaging of the head and cervical spine was performed following the standard protocol without intravenous contrast. Multiplanar CT image reconstructions of the cervical spine were also generated.  COMPARISON:  10/08/2014, 08/22/14  FINDINGS: CT HEAD FINDINGS  Bony calvarium is intact. Diffuse atrophic changes are seen. There are findings consistent with encephalomalacia in the frontal areas bilaterally. This is stable from the prior exam. No acute hemorrhage or acute infarct is identified. No space-occupying mass lesion is noted.  CT CERVICAL SPINE  FINDINGS  Seven cervical segments are well visualized. Vertebral body height is well maintained. Osteophytic changes are noted anteriorly at C4-5, C5-6 and C6-7. Mild facet hypertrophic changes are noted. No acute fracture or acute facet abnormality is seen. No gross soft tissue abnormality is seen. Carotid calcifications are noted.  IMPRESSION: CT of the head: Chronic ischemic/traumatic changes without acute abnormality.  CT of the cervical spine: Degenerative change without acute abnormality.   Electronically Signed   By: Inez Catalina M.D.   On: 10/31/2014 21:07   Ct Chest W Contrast  10/31/2014   CLINICAL DATA:  Seizure, elbow pain, probable fall  EXAM: CT CHEST, ABDOMEN, AND PELVIS WITH CONTRAST  TECHNIQUE: Multidetector CT imaging of the chest, abdomen and pelvis was performed following the standard protocol during bolus administration of intravenous contrast.  CONTRAST:  1 OMNIPAQUE IOHEXOL 300 MG/ML  SOLN  COMPARISON:  10/11/2014  FINDINGS: CT CHEST FINDINGS  Sagittal images of the spine shows no acute fractures. Sagittal view of the sternum is unremarkable. No scapular fracture. No definite clavicle fracture.  Atherosclerotic calcifications of thoracic aorta. Heart size within normal limits. No pericardial effusion.  At least 3 or 4 healing fractures are noted in right posterior lower ribs. Healing fracture in left posterior rib noted in axial image 56. Three there is nondisplaced acute fracture of the right 12 rib. See axial image 65. Some pleural thickening noted in right posterior hemithorax.  There is no mediastinal hematoma or adenopathy. No pericardial effusion.  No lung contusion or pneumothorax. Mild atelectasis noted in right lower lobe  posteriorly. There is no pneumothorax.  CT ABDOMEN AND PELVIS FINDINGS  Sagittal images of the lumbar spine shows significant disc space flattening with vacuum disc phenomenon at L4-L5 and L5-S1 level. Large anterior osteophytes and moderate disc space flattening  at L3-L4 level. Atherosclerotic calcifications of abdominal aorta and iliac arteries. Enhanced liver is unremarkable. No calcified gallstones are noted within gallbladder. Enhanced pancreas, spleen and adrenal glands are unremarkable. Degenerative changes bilateral SI joints. No sacral fracture is noted.  There is no evidence of urinary bladder injury. No pelvic fractures are noted. No inguinal adenopathy. Enhanced kidneys are symmetrical in size. No hydronephrosis or hydroureter. Delayed renal images shows bilateral renal symmetrical excretion.  No pericecal inflammation. The terminal ileum is unremarkable. Normal appendix.  Coronal images shows bilateral hip joint symmetrical in appearance. No hip fractures.  IMPRESSION: 1. There are bilateral posterior healing fractures as described above. Question tiny nondisplaced acute fracture of the right twelfth rib. 2. No lung contusion or pneumothorax. 3. No mediastinal hematoma or adenopathy. 4. Degenerative changes bilateral SI joints. Degenerative changes lumbar spine. 5. No acute visceral injury within abdomen or pelvis. 6. No pericecal inflammation.  Normal appendix. 7. No evidence of urinary bladder injury. 8. No acute fractures are noted within abdomen and pelvis. Degenerative changes lumbar spine. 9.   Electronically Signed   By: Lahoma Crocker M.D.   On: 10/31/2014 21:10   Ct Cervical Spine Wo Contrast  10/31/2014   CLINICAL DATA:  Unwitnessed fall  EXAM: CT HEAD WITHOUT CONTRAST  CT CERVICAL SPINE WITHOUT CONTRAST  TECHNIQUE: Multidetector CT imaging of the head and cervical spine was performed following the standard protocol without intravenous contrast. Multiplanar CT image reconstructions of the cervical spine were also generated.  COMPARISON:  10/08/2014, 08/22/14  FINDINGS: CT HEAD FINDINGS  Bony calvarium is intact. Diffuse atrophic changes are seen. There are findings consistent with encephalomalacia in the frontal areas bilaterally. This is stable from the  prior exam. No acute hemorrhage or acute infarct is identified. No space-occupying mass lesion is noted.  CT CERVICAL SPINE FINDINGS  Seven cervical segments are well visualized. Vertebral body height is well maintained. Osteophytic changes are noted anteriorly at C4-5, C5-6 and C6-7. Mild facet hypertrophic changes are noted. No acute fracture or acute facet abnormality is seen. No gross soft tissue abnormality is seen. Carotid calcifications are noted.  IMPRESSION: CT of the head: Chronic ischemic/traumatic changes without acute abnormality.  CT of the cervical spine: Degenerative change without acute abnormality.   Electronically Signed   By: Inez Catalina M.D.   On: 10/31/2014 21:07   Mr Brain Wo Contrast  11/03/2014   CLINICAL DATA:  Chronic seizure disorder. Recent hospitalization after fall [place]. Acute metabolic encephalopathy.  EXAM: MRI HEAD WITHOUT CONTRAST  TECHNIQUE: Multiplanar, multiecho pulse sequences of the brain and surrounding structures were obtained without intravenous contrast.  COMPARISON:  Head CT 10/31/2014  FINDINGS: The study suffers from some motion degradation but is sufficiently diagnostic. Diffusion imaging does not show any acute or subacute infarction. The brain shows generalized atrophy. No focal abnormality affects the brainstem or cerebellum. Within the cerebral hemispheres, there is bifrontal atrophy and gliosis, most commonly seen as a sequela of previous head trauma. Lesser changes also affect the temporal tips (left more than right), also typical of previous trauma. No evidence of ischemic infarctions. No mass lesion, acute hemorrhage, hydrocephalus or extra-axial collection. There are small foci of residual hemosiderin deposition in the frontal and temporal regions probably related to previous  head trauma. No mesial temporal lesion or asymmetry. No pituitary mass. No inflammatory sinus disease. No skull or skullbase lesion.  IMPRESSION: No acute or reversible finding.  Generalized brain atrophy. Bifrontal atrophy and gliosis presumably related to distant head trauma. Lesser changes of atrophy and gliosis affect the temporal tips as well.   Electronically Signed   By: Nelson Chimes M.D.   On: 11/03/2014 16:19   Ct Abdomen Pelvis W Contrast  10/31/2014   CLINICAL DATA:  Seizure, elbow pain, probable fall  EXAM: CT CHEST, ABDOMEN, AND PELVIS WITH CONTRAST  TECHNIQUE: Multidetector CT imaging of the chest, abdomen and pelvis was performed following the standard protocol during bolus administration of intravenous contrast.  CONTRAST:  1 OMNIPAQUE IOHEXOL 300 MG/ML  SOLN  COMPARISON:  10/11/2014  FINDINGS: CT CHEST FINDINGS  Sagittal images of the spine shows no acute fractures. Sagittal view of the sternum is unremarkable. No scapular fracture. No definite clavicle fracture.  Atherosclerotic calcifications of thoracic aorta. Heart size within normal limits. No pericardial effusion.  At least 3 or 4 healing fractures are noted in right posterior lower ribs. Healing fracture in left posterior rib noted in axial image 56. Three there is nondisplaced acute fracture of the right 12 rib. See axial image 65. Some pleural thickening noted in right posterior hemithorax.  There is no mediastinal hematoma or adenopathy. No pericardial effusion.  No lung contusion or pneumothorax. Mild atelectasis noted in right lower lobe posteriorly. There is no pneumothorax.  CT ABDOMEN AND PELVIS FINDINGS  Sagittal images of the lumbar spine shows significant disc space flattening with vacuum disc phenomenon at L4-L5 and L5-S1 level. Large anterior osteophytes and moderate disc space flattening at L3-L4 level. Atherosclerotic calcifications of abdominal aorta and iliac arteries. Enhanced liver is unremarkable. No calcified gallstones are noted within gallbladder. Enhanced pancreas, spleen and adrenal glands are unremarkable. Degenerative changes bilateral SI joints. No sacral fracture is noted.  There is no  evidence of urinary bladder injury. No pelvic fractures are noted. No inguinal adenopathy. Enhanced kidneys are symmetrical in size. No hydronephrosis or hydroureter. Delayed renal images shows bilateral renal symmetrical excretion.  No pericecal inflammation. The terminal ileum is unremarkable. Normal appendix.  Coronal images shows bilateral hip joint symmetrical in appearance. No hip fractures.  IMPRESSION: 1. There are bilateral posterior healing fractures as described above. Question tiny nondisplaced acute fracture of the right twelfth rib. 2. No lung contusion or pneumothorax. 3. No mediastinal hematoma or adenopathy. 4. Degenerative changes bilateral SI joints. Degenerative changes lumbar spine. 5. No acute visceral injury within abdomen or pelvis. 6. No pericecal inflammation.  Normal appendix. 7. No evidence of urinary bladder injury. 8. No acute fractures are noted within abdomen and pelvis. Degenerative changes lumbar spine. 9.   Electronically Signed   By: Lahoma Crocker M.D.   On: 10/31/2014 21:10   Dg Chest Port 1 View  10/16/2014   CLINICAL DATA:  One day history of fever.  Cough and congestion.  EXAM: PORTABLE CHEST - 1 VIEW  COMPARISON:  Chest radiograph October 09, 2014 and chest CT October 11, 2014  FINDINGS: There is no edema or consolidation. The heart size and pulmonary vascularity are normal. No adenopathy. There is evidence of old rib trauma on the right.  IMPRESSION: No edema or consolidation.   Electronically Signed   By: Lowella Grip III M.D.   On: 10/16/2014 08:11     Assessment/Plan   ICD-9-CM ICD-10-CM   1. Dementia with behavioral disturbance 294.21  F03.91   2. MRSA bacteremia 790.7 R78.81    041.12 B95.62   3. Seizures 780.39 R56.9   4. Essential hypertension, benign 401.1 I10   5. Hereditary and idiopathic peripheral neuropathy 356.9 G60.9   6. Nonunion fracture of left ulna 733.82 S52.202K   7. Tobacco dependency 305.1 F17.200   8. Chronic hepatitis C without hepatic  coma 070.54 B18.2   9. Paranoia 297.1 F22   10. History of alcohol abuse 305.03 F10.10    Complete Zyvox as ordered. F/u with specialists as scheduled. Cont current meds as ordered  Patient is being discharged with home health services: nursing for left elbow wound/fx   Patient is being discharged with the following durable medical equipment:  none  Patient has been advised to f/u with their PCP in 1-2 weeks to bring them up to date on their rehab stay.  They were provided with a 30 day supply of scripts for prescription medications and refills must be obtained from their PCP.  TIME SPENT (MINUTES): [place]. Perlie Gold  [facility] [address] [place], [place] [address] [phone] Cell (Monday-Friday 8 AM - 5 PM) [phone] After 5 PM and follow prompts

## 2014-11-20 ENCOUNTER — Other Ambulatory Visit: Payer: Self-pay | Admitting: Family Medicine

## 2014-12-04 ENCOUNTER — Telehealth: Payer: Self-pay

## 2014-12-04 ENCOUNTER — Encounter: Payer: Self-pay | Admitting: Adult Health

## 2014-12-04 ENCOUNTER — Ambulatory Visit (INDEPENDENT_AMBULATORY_CARE_PROVIDER_SITE_OTHER): Payer: Medicaid Other | Admitting: Adult Health

## 2014-12-04 VITALS — BP 149/90 | HR 76 | Ht 78.0 in | Wt 201.0 lb

## 2014-12-04 DIAGNOSIS — Z9114 Patient's other noncompliance with medication regimen: Secondary | ICD-10-CM | POA: Diagnosis not present

## 2014-12-04 DIAGNOSIS — R569 Unspecified convulsions: Secondary | ICD-10-CM

## 2014-12-04 DIAGNOSIS — Z5181 Encounter for therapeutic drug level monitoring: Secondary | ICD-10-CM | POA: Diagnosis not present

## 2014-12-04 MED ORDER — LEVETIRACETAM 750 MG PO TABS: 1500.0000 mg | ORAL_TABLET | Freq: Two times a day (BID) | ORAL | Status: DC

## 2014-12-04 NOTE — Telephone Encounter (Signed)
Arnetha Courser with [facility] [phone] Kimuria Snipes cell best contact number [phone] office number [phone].
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
## 2014-12-04 NOTE — Progress Notes (Signed)
I have read the note, and I agree with the clinical assessment and plan.  [name],CHARLES KEITH   

## 2014-12-04 NOTE — Telephone Encounter (Signed)
Attempted to call Arnetha Courser at [facility] at [phone], no answer, left a message asking her to please call me back.

## 2014-12-04 NOTE — Patient Instructions (Addendum)
Care [address] Clydia Llano [phone]    Continue Keppra and Lyrica Pharmacy aware that the patient needs Keppra.

## 2014-12-05 LAB — CBC WITH DIFFERENTIAL/PLATELET
BASOS: 0 %
Basophils Absolute: 0 10*3/uL (ref 0.0–0.2)
EOS (ABSOLUTE): 0.1 10*3/uL (ref 0.0–0.4)
EOS: 1 %
HEMATOCRIT: 40 % (ref 37.5–51.0)
HEMOGLOBIN: 13.6 g/dL (ref 12.6–17.7)
IMMATURE GRANULOCYTES: 0 %
Immature Grans (Abs): 0 10*3/uL (ref 0.0–0.1)
LYMPHS ABS: 3.6 10*3/uL — AB (ref 0.7–3.1)
Lymphs: 50 %
MCH: 34.3 pg — ABNORMAL HIGH (ref 26.6–33.0)
MCHC: 34 g/dL (ref 31.5–35.7)
MCV: 101 fL — AB (ref 79–97)
MONOCYTES: 10 %
MONOS ABS: 0.8 10*3/uL (ref 0.1–0.9)
Neutrophils Absolute: 2.9 10*3/uL (ref 1.4–7.0)
Neutrophils: 39 %
Platelets: 179 10*3/uL (ref 150–379)
RBC: 3.97 x10E6/uL — AB (ref 4.14–5.80)
RDW: 14.8 % (ref 12.3–15.4)
WBC: 7.4 10*3/uL (ref 3.4–10.8)

## 2014-12-05 LAB — COMPREHENSIVE METABOLIC PANEL
A/G RATIO: 1.5 (ref 1.1–2.5)
ALBUMIN: 4.4 g/dL (ref 3.5–5.5)
ALT: 33 IU/L (ref 0–44)
AST: 32 IU/L (ref 0–40)
Alkaline Phosphatase: 114 IU/L (ref 39–117)
BUN / CREAT RATIO: 17 (ref 9–20)
BUN: 12 mg/dL (ref 6–24)
Bilirubin Total: <0.2 mg/dL (ref 0.0–1.2)
CALCIUM: 8.9 mg/dL (ref 8.7–10.2)
CO2: 26 mmol/L (ref 18–29)
CREATININE: 0.72 mg/dL — AB (ref 0.76–1.27)
Chloride: 98 mmol/L (ref 97–108)
GFR calc Af Amer: 121 mL/min/{1.73_m2} (ref >59)
GFR, EST NON AFRICAN AMERICAN: 105 mL/min/{1.73_m2} (ref >59)
GLOBULIN, TOTAL: 3 g/dL (ref 1.5–4.5)
Glucose: 91 mg/dL (ref 65–99)
POTASSIUM: 4.5 mmol/L (ref 3.5–5.2)
SODIUM: 138 mmol/L (ref 134–144)
Total Protein: 7.4 g/dL (ref 6.0–8.5)

## 2014-12-08 NOTE — Telephone Encounter (Signed)
Called and spoke to Case worker Selena Batten DPR on file. Relayed labs were normal. Selena Batten will relay to patient if any question's or concerns please give the office a call back. Kim understood. Patient can't find his cell phone.

## 2014-12-08 NOTE — Telephone Encounter (Signed)
Attempted to call [name] [name]. Her cell phone is full and I can't leave a voicemail. I called the office number and left a message asking them to call me back.

## 2014-12-08 NOTE — Telephone Encounter (Signed)
Try calling Orson Eva cell best contact number [phone] office number [phone]. She gave [place] the number for carol wright- maybe she can help [place] get in touch her with her. Also ask Ms. Snipes if she has spoken to [employer] from [employer]. Thanks.

## 2014-12-08 NOTE — Telephone Encounter (Signed)
Left another message with Arnetha Courser at [facility] per Aundra Millet, NP request to discuss medication management and possible intervention. Asked Okey Regal to call us back to discuss a pt.

## 2014-12-08 NOTE — Telephone Encounter (Signed)
-----   Message from Butch Penny, NP sent at 12/05/2014  3:51 PM EDT ----- Lab work is unremarkable. Please call patient.

## 2014-12-08 NOTE — Telephone Encounter (Signed)
Attempted to call Arnetha Courser at [facility] again. No answer.

## 2014-12-09 NOTE — Telephone Encounter (Signed)
Spoke to Ms. Snipes. She says that she will try and get in touch of Arnetha Courser at [facility]. She is also going to speak with her company about perhaps getting help with pt's medications daily. She says that she was able to speak with Clydia Llano at [facility] and [place] gave them help. Jon Gills says that the pt will not qualify for assisted living at this time. However, he refuses to go to a group home.  Ms. Precious Bard said she would call me back either today or tomorrow with an update on getting home health set up for the pt and/or getting in touch with [facility].

## 2014-12-11 NOTE — Telephone Encounter (Signed)
[facility] returned your call [phone]

## 2014-12-11 NOTE — Telephone Encounter (Signed)
Called Ms Precious Bard. She says that she spoke to Arnetha Courser at [facility] and she said that she would compile a list of ALFs for pt to go to, but ultimately it is up to the pt whether he wants to go or not. Arnetha Courser told Ms Precious Bard that we could write an order that pt's meds need to be managed, but I asked about who would manage his meds? Ms. Precious Bard was unable to answer that. I advised her that I would attempt to call Arnetha Courser again.  Ms. Precious Bard said that through her organization, there is something called the "ACT team" and they could take the medications out of the pt's house and deliver to him each day. She was going to see if pt qualified for this and call us back.  I called Arnetha Courser, who for the fourth time, did not answer her phone so I left another message asking her to call me back.

## 2014-12-11 NOTE — Telephone Encounter (Signed)
Ms. Precious Bard said she spoke to the ACT team regarding pt and they have put in a request for authorization for the pt to have this service. Ms. Precious Bard did advise me that sometimes the ACT team is only approved to go 3x/week, but sometimes they do go every day. She said the ACT would check on his medications every time they came, however.   She also said that she spoke to the [facility] agency taking care of pts. They told her that they cannot administer medication. They can only prompt the pt to take it.  Ms. Precious Bard is going to call me back as she gets more information.

## 2014-12-12 DIAGNOSIS — T148XXA Other injury of unspecified body region, initial encounter: Secondary | ICD-10-CM | POA: Insufficient documentation

## 2014-12-15 NOTE — Telephone Encounter (Signed)
Noted. We will see what psychotherapy has to offer the patient first.

## 2014-12-16 NOTE — Telephone Encounter (Signed)
Returned Ms. Snipes phone call. No answer, mailbox full, unable to leave a message.

## 2014-12-16 NOTE — Telephone Encounter (Signed)
Called pt to

## 2014-12-16 NOTE — Telephone Encounter (Signed)
[facility] called she is meeting with the pt. She has a list of places that pt can move to. They will go over it together.

## 2014-12-17 NOTE — Telephone Encounter (Signed)
Called Ms. Snipes for an update, unable to leave message, mailbox full.

## 2014-12-22 NOTE — Telephone Encounter (Signed)
Spoke to [name] [name]. [facility] is still undergoing authorizations to get the program to go out and manage pt's medications. This has not been completed yet.   Pt still unwilling to move to [name] at this time. [name] [name] and [facility] have both tried to explain to pt the benefit of moving to an [name].  The [facility] aide can prompt pt to take medication, but cannot administer the medication.

## 2014-12-22 NOTE — Telephone Encounter (Signed)
Noted. Unfortunately we can only encourage the patient to consider an assisted living facility. [facility] is involved at this point but they can only make recommendations as well.

## 2015-01-02 ENCOUNTER — Ambulatory Visit: Payer: Self-pay | Admitting: Family Medicine

## 2015-01-05 ENCOUNTER — Telehealth: Payer: Self-pay | Admitting: Neurology

## 2015-01-05 ENCOUNTER — Encounter: Payer: Self-pay | Admitting: Internal Medicine

## 2015-01-05 ENCOUNTER — Ambulatory Visit: Payer: Medicaid Other | Attending: Internal Medicine | Admitting: Internal Medicine

## 2015-01-05 VITALS — BP 144/85 | HR 60 | Temp 98.3°F | Resp 18 | Ht 77.0 in | Wt 202.8 lb

## 2015-01-05 DIAGNOSIS — G894 Chronic pain syndrome: Secondary | ICD-10-CM | POA: Diagnosis not present

## 2015-01-05 DIAGNOSIS — G609 Hereditary and idiopathic neuropathy, unspecified: Secondary | ICD-10-CM | POA: Insufficient documentation

## 2015-01-05 DIAGNOSIS — Z79899 Other long term (current) drug therapy: Secondary | ICD-10-CM | POA: Insufficient documentation

## 2015-01-05 DIAGNOSIS — F17210 Nicotine dependence, cigarettes, uncomplicated: Secondary | ICD-10-CM | POA: Diagnosis not present

## 2015-01-05 DIAGNOSIS — F17200 Nicotine dependence, unspecified, uncomplicated: Secondary | ICD-10-CM

## 2015-01-05 DIAGNOSIS — R569 Unspecified convulsions: Secondary | ICD-10-CM | POA: Diagnosis not present

## 2015-01-05 DIAGNOSIS — I10 Essential (primary) hypertension: Secondary | ICD-10-CM | POA: Insufficient documentation

## 2015-01-05 DIAGNOSIS — Z8619 Personal history of other infectious and parasitic diseases: Secondary | ICD-10-CM | POA: Diagnosis not present

## 2015-01-05 DIAGNOSIS — Z720 Tobacco use: Secondary | ICD-10-CM | POA: Diagnosis not present

## 2015-01-05 MED ORDER — ACETAMINOPHEN-CODEINE #3 300-30 MG PO TABS: 1.0000 | ORAL_TABLET | ORAL | Status: DC | PRN

## 2015-01-05 NOTE — Progress Notes (Signed)
Patient complains of HA being present for over a month, rated at 9, described as a throbbing pain. Pain medication gives relief.  Patient states after being taken off tegretol he has been having seizures. Patient would like Tegretol back on med list.  Patient states Keppra does not help his condition. Patient states Rehab facility removed medications from patient.  Patient has not eaten or taken medications this morning

## 2015-01-05 NOTE — Patient Instructions (Signed)
Hypertension Hypertension, commonly called high blood pressure, is when the force of blood pumping through your arteries is too strong. Your arteries are the blood vessels that carry blood from your heart throughout your body. A blood pressure reading consists of a higher number over a lower number, such as 110/72. The higher number (systolic) is the pressure inside your arteries when your heart pumps. The lower number (diastolic) is the pressure inside your arteries when your heart relaxes. Ideally you want your blood pressure below 120/80. Hypertension forces your heart to work harder to pump blood. Your arteries may become narrow or stiff. Having untreated or uncontrolled hypertension can cause heart attack, stroke, kidney disease, and other problems. RISK FACTORS Some risk factors for high blood pressure are controllable. Others are not.  Risk factors you cannot control include:   Race. You may be at higher risk if you are African American.  Age. Risk increases with age.  Gender. Men are at higher risk than women before age 45 years. After age 65, women are at higher risk than men. Risk factors you can control include:  Not getting enough exercise or physical activity.  Being overweight.  Getting too much fat, sugar, calories, or salt in your diet.  Drinking too much alcohol. SIGNS AND SYMPTOMS Hypertension does not usually cause signs or symptoms. Extremely high blood pressure (hypertensive crisis) may cause headache, anxiety, shortness of breath, and nosebleed. DIAGNOSIS To check if you have hypertension, your health care provider will measure your blood pressure while you are seated, with your arm held at the level of your heart. It should be measured at least twice using the same arm. Certain conditions can cause a difference in blood pressure between your right and left arms. A blood pressure reading that is higher than normal on one occasion does not mean that you need treatment. If  it is not clear whether you have high blood pressure, you may be asked to return on a different day to have your blood pressure checked again. Or, you may be asked to monitor your blood pressure at home for 1 or more weeks. TREATMENT Treating high blood pressure includes making lifestyle changes and possibly taking medicine. Living a healthy lifestyle can help lower high blood pressure. You may need to change some of your habits. Lifestyle changes may include:  Following the DASH diet. This diet is high in fruits, vegetables, and whole grains. It is low in salt, red meat, and added sugars.  Keep your sodium intake below 2,300 mg per day.  Getting at least 30-45 minutes of aerobic exercise at least 4 times per week.  Losing weight if necessary.  Not smoking.  Limiting alcoholic beverages.  Learning ways to reduce stress. Your health care provider may prescribe medicine if lifestyle changes are not enough to get your blood pressure under control, and if one of the following is true:  You are 18-59 years of age and your systolic blood pressure is above 140.  You are 60 years of age or older, and your systolic blood pressure is above 150.  Your diastolic blood pressure is above 90.  You have diabetes, and your systolic blood pressure is over 140 or your diastolic blood pressure is over 90.  You have kidney disease and your blood pressure is above 140/90.  You have heart disease and your blood pressure is above 140/90. Your personal target blood pressure may vary depending on your medical conditions, your age, and other factors. HOME CARE INSTRUCTIONS    Have your blood pressure rechecked as directed by your health care provider.   Take medicines only as directed by your health care provider. Follow the directions carefully. Blood pressure medicines must be taken as prescribed. The medicine does not work as well when you skip doses. Skipping doses also puts you at risk for  problems.  Do not smoke.   Monitor your blood pressure at home as directed by your health care provider. SEEK MEDICAL CARE IF:   You think you are having a reaction to medicines taken.  You have recurrent headaches or feel dizzy.  You have swelling in your ankles.  You have trouble with your vision. SEEK IMMEDIATE MEDICAL CARE IF:  You develop a severe headache or confusion.  You have unusual weakness, numbness, or feel faint.  You have severe chest or abdominal pain.  You vomit repeatedly.  You have trouble breathing. MAKE SURE YOU:   Understand these instructions.  Will watch your condition.  Will get help right away if you are not doing well or get worse.   This information is not intended to replace advice given to you by your health care provider. Make sure you discuss any questions you have with your health care provider.   Document Released: 03/07/2005 Document Revised: 07/22/2014 Document Reviewed: 12/28/2012 Elsevier Interactive Patient Education 2016 [employer] Epilepsy Epilepsy is a disorder in which a person has repeated seizures over time. A seizure is a release of abnormal electrical activity in the brain. Seizures can cause a change in attention, behavior, or the ability to remain awake and alert (altered mental status). Seizures often involve uncontrollable shaking (convulsions).  Most people with epilepsy lead normal lives. However, people with epilepsy are at an increased risk of falls, accidents, and injuries. Therefore, it is important to begin treatment right away. CAUSES  Epilepsy has many possible causes. Anything that disturbs the normal pattern of brain cell activity can lead to seizures. This may include:   Head injury.  Birth trauma.  High fever as a child.  Stroke.  Bleeding into or around the brain.  Certain drugs.  Prolonged low oxygen, such as what occurs after CPR efforts.  Abnormal brain development.  Certain illnesses,  such as meningitis, encephalitis (brain infection), malaria, and other infections.  An imbalance of nerve signaling chemicals (neurotransmitters).  SIGNS AND SYMPTOMS  The symptoms of a seizure can vary greatly from one person to another. Right before a seizure, you may have a warning (aura) that a seizure is about to occur. An aura may include the following symptoms:  Fear or anxiety.  Nausea.  Feeling like the room is spinning (vertigo).  Vision changes, such as seeing flashing lights or spots. Common symptoms during a seizure include:  Abnormal sensations, such as an abnormal smell or a bitter taste in the mouth.   Sudden, general body stiffness.   Convulsions that involve rhythmic jerking of the face, arm, or leg on one or both sides.   Sudden change in consciousness.   Appearing to be awake but not responding.   Appearing to be asleep but cannot be awakened.   Grimacing, chewing, lip smacking, drooling, tongue biting, or loss of bowel or bladder control. After a seizure, you may feel sleepy for a while. DIAGNOSIS  Your health care provider will ask about your symptoms and take a medical history. Descriptions from any witnesses to your seizures will be very helpful in the diagnosis. A physical exam, including a detailed neurological exam, is necessary.  Various tests may be done, such as:   An electroencephalogram (EEG). This is a painless test of your brain waves. In this test, a diagram is created of your brain waves. These diagrams can be interpreted by a specialist.  An MRI of the brain.   A CT scan of the brain.   A spinal tap (lumbar puncture, LP).  Blood tests to check for signs of infection or abnormal blood chemistry. TREATMENT  There is no cure for epilepsy, but it is generally treatable. Once epilepsy is diagnosed, it is important to begin treatment as soon as possible. For most people with epilepsy, seizures can be controlled with medicines. The  following may also be used:  A pacemaker for the brain (vagus nerve stimulator) can be used for people with seizures that are not well controlled by medicine.  Surgery on the brain. For some people, epilepsy eventually goes away. HOME CARE INSTRUCTIONS   Follow your health care provider's recommendations on driving and safety in normal activities.  Get enough rest. Lack of sleep can cause seizures.  Only take over-the-counter or prescription medicines as directed by your health care provider. Take any prescribed medicine exactly as directed.  Avoid any known triggers of your seizures.  Keep a seizure diary. Record what you recall about any seizure, especially any possible trigger.   Make sure the people you live and work with know that you are prone to seizures. They should receive instructions on how to help you. In general, a witness to a seizure should:   Cushion your head and body.   Turn you on your side.   Avoid unnecessarily restraining you.   Not place anything inside your mouth.   Call for emergency medical help if there is any question about what has occurred.   Follow up with your health care provider as directed. You may need regular blood tests to monitor the levels of your medicine.  SEEK MEDICAL CARE IF:   You develop signs of infection or other illness. This might increase the risk of a seizure.   You seem to be having more frequent seizures.   Your seizure pattern is changing.  SEEK IMMEDIATE MEDICAL CARE IF:   You have a seizure that does not stop after a few moments.   You have a seizure that causes any difficulty in breathing.   You have a seizure that results in a very severe headache.   You have a seizure that leaves you with the inability to speak or use a part of your body.    This information is not intended to replace advice given to you by your health care provider. Make sure you discuss any questions you have with your health  care provider.   Document Released: 03/07/2005 Document Revised: 12/26/2012 Document Reviewed: 10/17/2012 Elsevier Interactive Patient Education [employer]

## 2015-01-05 NOTE — Progress Notes (Signed)
Patient ID: [name] [name], male   DOB: 07-27-1960, 54 y.o.   MRN: [id number]   [name] AlexanderFennell Ouellet, is a 54 y.o. male  [id number]  ION:[id number]  DOB - 07-27-1960  No chief complaint on file.       Subjective:   [name] AlexanderFennell Rini is a 54 y.o. male with history of aneurysmal rupture and subsequent seizures, hypertension and hepatitis C here today for a follow up visit. The patient was in the hospital in August for altered mental status thought to be from hyponatremia related to Tegretol. This medication was discontinued. He was continued on Keppra 1500 mg twice a day as well as Lyrica. Patient claimed that since getting home from the hospital, his seizure frequency has increased and he would like to be put back on his Tegretol. He states that he's had multiple seizures. [facility] for a possible fractured limb from seizure activity. The patient has a history of bipolar disease as well as memory disturbance. His other medications are listed below. He is on metoprolol for hypertension with questionable compliance. Patient has a small bump on his head that has been there for years, he wonders if this can be removed. There is no new changes with his bump, no pain, no increasing size, no change in color. Patient has chronic headache, No chest pain, No abdominal pain - No Nausea, No new weakness tingling or numbness, No Cough - SOB.  Problem  Smoking  Chronic Pain Syndrome    ALLERGIES: No Known Allergies  PAST MEDICAL HISTORY: Past Medical History  Diagnosis Date  . Seizure (HCC)   . ETOHism (HCC)   . Aneurysm (HCC)   . Hypertension   . Hepatitis C   . Hereditary and idiopathic peripheral neuropathy 06/03/2014    MEDICATIONS AT HOME: Prior to Admission medications   Medication Sig Start Date End Date Taking? Authorizing Provider  levETIRAcetam (KEPPRA) 750 MG tablet Take 2 tablets (1,500 mg total) by mouth 2 (two) times daily. 12/04/14  Yes  Butch PennyMegan Millikan, NP  acetaminophen-codeine (TYLENOL #3) 300-30 MG tablet Take 1 tablet by mouth every 4 (four) hours as needed. 01/05/15   Quentin Angstlugbemiga E Kristy Catoe, MD  folic acid (FOLVITE) 1 MG tablet Take 1 tablet (1 mg total) by mouth daily. Patient not taking: Reported on 12/04/2014 08/15/14   Doris Cheadleeepak Advani, MD  linezolid (ZYVOX) 600 MG tablet Take 1 tablet (600 mg total) by mouth every 12 (twelve) hours. Patient not taking: Reported on 12/04/2014 11/05/14   Rhetta MuraJai-Gurmukh Samtani, MD  metoprolol (LOPRESSOR) 50 MG tablet Take 0.5 tablets (25 mg total) by mouth 2 (two) times daily. Patient not taking: Reported on 12/04/2014 09/10/14   Doris Cheadleeepak Advani, MD  nicotine (NICODERM CQ - DOSED IN MG/24 HOURS) 21 mg/24hr patch Place 1 patch (21 mg total) onto the skin daily. Patient not taking: Reported on 12/04/2014 11/05/14   Rhetta MuraJai-Gurmukh Samtani, MD  pregabalin (LYRICA) 50 MG capsule Take 2 tablets in the morning, 1 tablet at noon and 2 tablets at bedtime. Patient not taking: Reported on 12/04/2014 09/03/14   York Spanielharles K [name], MD     Objective:   Filed Vitals:   01/05/15 1138  BP: 144/85  Pulse: 60  Temp: 98.3 F (36.8 C)  TempSrc: Oral  Resp: 18  Height: 6\' 5"  (1.956 m)  Weight: 202 lb 12.8 oz (91.989 kg)  SpO2: 96%    Exam General appearance : Awake, alert, not in any distress. Speech Clear. Not toxic looking HEENT: Atraumatic and Normocephalic,  pupils equally reactive to light and accomodation Neck: supple, no JVD. No cervical lymphadenopathy.  Chest:Good air entry bilaterally, no added sounds  CVS: S1 S2 regular, no murmurs.  Abdomen: Bowel sounds present, Non tender and not distended with no gaurding, rigidity or rebound. Extremities: B/L Lower Ext shows no edema, both legs are warm to touch Neurology: Awake alert, and oriented X 3, CN II-XII intact, Non focal, but slow reaction time. Skin: ?Dernoid cyst (1x1cm) on right temple Data Review Lab Results  Component Value Date   HGBA1C 5.7*  01/24/2014     Assessment & Plan   1. Essential hypertension, benign  We have discussed target BP range and blood pressure goal. I have advised patient to check BP regularly and to call us back or report to clinic if the numbers are consistently higher than 140/90. We discussed the importance of compliance with medical therapy and DASH diet recommended, consequences of uncontrolled hypertension discussed.   - continue current BP medications  2. Smoking  Purl was counseled on the dangers of tobacco use, and was advised to quit. Reviewed strategies to maximize success, including removing cigarettes and smoking materials from environment, stress management and support of family/friends.   3. Seizures (HCC) with increasing breakthrough seizures  Patient is having increasing frequency of seizure since carbamazepine was discontinued because of hyponatremic side effect. I called and  discussed his case with Dr. Anne Hahn (neurologist), patient will be seen next week for medication review and management. For now patient will continue current medications and follow up with neurologist as scheduled.  4. Chronic pain syndrome Prescribed: - acetaminophen-codeine (TYLENOL #3) 300-30 MG tablet; Take 1 tablet by mouth every 4 (four) hours as needed.  Dispense: 60 tablet; Refill: 0  Patient have been counseled extensively about nutrition and exercise  Return in about 3 months (around 04/07/2015) for Follow up Pain and comorbidities,, Follow up HTN.  The patient was given clear instructions to go to ER or return to medical center if symptoms don't improve, worsen or new problems develop. The patient verbalized understanding. The patient was told to call to get lab results if they haven't heard anything in the next week.   This note has been created with Education officer, environmental. Any transcriptional errors are unintentional.    Jeanann Lewandowsky, MD, MHA, FACP,  FAAP, CPE [facility] [place], [place] [phone]   01/05/2015, 5:26 PM

## 2015-01-05 NOTE — Telephone Encounter (Signed)
I called the patient' care coordinator and left a voicemail asking her to call me back to scheduled a f/u appointment for the patient.

## 2015-01-05 NOTE — Telephone Encounter (Signed)
I spoke to [name] [name]. Appointment scheduled for 01/15/15.

## 2015-01-05 NOTE — Telephone Encounter (Signed)
The patient is having increased problems with seizures. He was taken off of carbamazepine previously because of hyponatremia. I will try to get a revisit for him to be seen soon.

## 2015-01-15 ENCOUNTER — Ambulatory Visit (INDEPENDENT_AMBULATORY_CARE_PROVIDER_SITE_OTHER): Payer: Medicaid Other | Admitting: Neurology

## 2015-01-15 ENCOUNTER — Encounter: Payer: Self-pay | Admitting: Neurology

## 2015-01-15 VITALS — BP 164/77 | HR 65 | Ht 77.0 in | Wt 208.5 lb

## 2015-01-15 DIAGNOSIS — R569 Unspecified convulsions: Secondary | ICD-10-CM | POA: Diagnosis not present

## 2015-01-15 DIAGNOSIS — F03918 Unspecified dementia, unspecified severity, with other behavioral disturbance: Secondary | ICD-10-CM

## 2015-01-15 DIAGNOSIS — F0391 Unspecified dementia with behavioral disturbance: Secondary | ICD-10-CM

## 2015-01-15 MED ORDER — DIVALPROEX SODIUM 500 MG PO DR TAB: 500.0000 mg | DELAYED_RELEASE_TABLET | Freq: Two times a day (BID) | ORAL | Status: DC

## 2015-01-15 NOTE — Progress Notes (Addendum)
Reason for visit: Seizures  [name] [name] is an 54 y.o. male  History of present illness:  Mr. [name] [name] is a 54 year old right-handed black male with a history of a ruptured cerebral aneurysm in the past, and subsequent seizures. The patient has had intractable seizures. He has been on carbamazepine and Keppra, but the carbamazepine resulted in hyponatremia requiring cessation of the drug. The patient has been on Lyrica for neuropathy pain. He continues to have seizures with some regularity, averaging 1 a week. The last seizure was 3 or 4 days ago. The patient does not operate a motor vehicle. He indicates that he has a nurse who comes into his home 5 days a week to ensure that he is getting his medications properly. The patient has a pill dispenser. He returns to this office for further evaluation.  Past Medical History  Diagnosis Date  . Seizure (HCC)   . ETOHism (HCC)   . Aneurysm (HCC)   . Hypertension   . Hepatitis C   . Hereditary and idiopathic peripheral neuropathy 06/03/2014    Past Surgical History  Procedure Laterality Date  . Cerebral aneurysm repair      [facility]  . I&d extremity Left 10/26/2012    Procedure: IRRIGATION AND DEBRIDEMENT Left Elbow;  Surgeon: Sheral Apleyimothy D Murphy, MD;  [facility];  Service: Orthopedics;  Laterality: Left;  . I&d extremity Left 10/29/2012    Procedure: IRRIGATION AND DEBRIDEMENT EXTREMITY, wound vac change, stimulan beads;  Surgeon: Sheral Apleyimothy D Murphy, MD;  Location: [facility] OR;  Service: Orthopedics;  Laterality: Left;  lateral on bean bag  . Hardware removal Left 10/29/2012    Procedure: HARDWARE REMOVAL;  Surgeon: Sheral Apleyimothy D Murphy, MD;  [facility];  Service: Orthopedics;  Laterality: Left;  . Incision and drainage abscess Left 11/02/2012    Procedure: INCISION AND DRAINAGE ABSCESS;  Surgeon: Sheral Apleyimothy D Murphy, MD;  Location: [facility] OR;  Service: Orthopedics;  Laterality: Left;  . I&d extremity Left 11/02/2012    Procedure:  IRRIGATION AND DEBRIDEMENT EXTREMITY;  Surgeon: Sheral Apleyimothy D Murphy, MD;  Location: [facility] OR;  Service: Orthopedics;  Laterality: Left;    Family History  Problem Relation Age of Onset  . Hypertension Mother   . Cancer Mother   . Hypertension Father   . Diabetes Sister   . Cancer Sister   . Cancer Sister     Social history:  reports that he has quit smoking. His smoking use included Cigarettes. He smoked 0.25 packs per day. He has never used smokeless tobacco. He reports that he does not drink alcohol or use illicit drugs.    Allergies  Allergen Reactions  . Carbamazepine     Hyponatremia    Medications:  Prior to Admission medications   Medication Sig Start Date End Date Taking? Authorizing Provider  acetaminophen-codeine (TYLENOL #3) 300-30 MG tablet Take 1 tablet by mouth every 4 (four) hours as needed. 01/05/15  Yes Quentin Angstlugbemiga E Jegede, MD  folic acid (FOLVITE) 1 MG tablet Take 1 tablet (1 mg total) by mouth daily. 08/15/14  Yes Doris Cheadleeepak Advani, MD  levETIRAcetam (KEPPRA) 750 MG tablet Take 2 tablets (1,500 mg total) by mouth 2 (two) times daily. 12/04/14  Yes Butch PennyMegan Millikan, NP  linezolid (ZYVOX) 600 MG tablet Take 1 tablet (600 mg total) by mouth every 12 (twelve) hours. 11/05/14  Yes Rhetta MuraJai-Gurmukh Samtani, MD  metoprolol (LOPRESSOR) 50 MG tablet Take 0.5 tablets (25 mg total) by mouth 2 (two) times daily. 09/10/14  Yes  [name], MD  nicotine (NICODERM CQ - DOSED IN MG/24 HOURS) 21 mg/24hr patch Place 1 patch (21 mg total) onto the skin daily. 11/05/14  Yes Rhetta Mura, MD  pregabalin (LYRICA) 50 MG capsule Take 2 tablets in the morning, 1 tablet at noon and 2 tablets at bedtime. 09/03/14  Yes York Spaniel, MD    ROS:  Out of a complete 14 system review of symptoms, the patient complains only of the following symptoms, and all other reviewed systems are negative.  Seizures Gait instability  Blood pressure 164/77, pulse 65, height  (1.956 m), weight 208 lb 8 oz  (94.575 kg).  Physical Exam  General: The patient is alert and cooperative at the time of the examination.  Skin: No significant peripheral edema is noted.   Neurologic Exam  Mental status: The patient is alert and oriented x 3 at the time of the examination. The patient has apparent normal recent and remote memory, with an apparently normal attention span and concentration ability.   Cranial nerves: Facial symmetry is present. Speech is slightly dysarthric. Extraocular movements are full. Visual fields are full.  Motor: The patient has good strength in all 4 extremities.  Sensory examination: Soft touch sensation is decreased on the right face, arm, and leg.  Coordination: The patient has good finger-nose-finger and heel-to-shin bilaterally.  Gait and station: The patient has a wide-based, unsteady gait. Tandem gait was not attempted. Romberg is negative, but unsteady.  Reflexes: Deep tendon reflexes are symmetric.   Assessment/Plan:  1. History of ruptured cerebral aneurysm  2. Intractable seizures  3. Gait disorder  The patient has had hyponatremia on carbamazepine, this medication had to be discontinued. The patient will be placed on Depakote 500 mg twice daily. The medication may be increased over time, he will follow-up in 3 months. He will call our office if he is having side effects on the medication. The patient will continue the Keppra and Lyrica.  Marlan Palau MD 01/15/2015 8:45 PM  [facility] [address] [place], [place] [address]  Phone [phone] Fax [phone]

## 2015-01-15 NOTE — Patient Instructions (Addendum)
We will add Depakote taking 500 mg twice a day. Call if you are having any side effects on the medication.   Epilepsy Epilepsy is a disorder in which a person has repeated seizures over time. A seizure is a release of abnormal electrical activity in the brain. Seizures can cause a change in attention, behavior, or the ability to remain awake and alert (altered mental status). Seizures often involve uncontrollable shaking (convulsions).  Most people with epilepsy lead normal lives. However, people with epilepsy are at an increased risk of falls, accidents, and injuries. Therefore, it is important to begin treatment right away. CAUSES  Epilepsy has many possible causes. Anything that disturbs the normal pattern of brain cell activity can lead to seizures. This may include:   Head injury.  Birth trauma.  High fever as a child.  Stroke.  Bleeding into or around the brain.  Certain drugs.  Prolonged low oxygen, such as what occurs after CPR efforts.  Abnormal brain development.  Certain illnesses, such as meningitis, encephalitis (brain infection), malaria, and other infections.  An imbalance of nerve signaling chemicals (neurotransmitters).  SIGNS AND SYMPTOMS  The symptoms of a seizure can vary greatly from one person to another. Right before a seizure, you may have a warning (aura) that a seizure is about to occur. An aura may include the following symptoms:  Fear or anxiety.  Nausea.  Feeling like the room is spinning (vertigo).  Vision changes, such as seeing flashing lights or spots. Common symptoms during a seizure include:  Abnormal sensations, such as an abnormal smell or a bitter taste in the mouth.   Sudden, general body stiffness.   Convulsions that involve rhythmic jerking of the face, arm, or leg on one or both sides.   Sudden change in consciousness.   Appearing to be awake but not responding.   Appearing to be asleep but cannot be awakened.    Grimacing, chewing, lip smacking, drooling, tongue biting, or loss of bowel or bladder control. After a seizure, you may feel sleepy for a while. DIAGNOSIS  Your health care provider will ask about your symptoms and take a medical history. Descriptions from any witnesses to your seizures will be very helpful in the diagnosis. A physical exam, including a detailed neurological exam, is necessary. Various tests may be done, such as:   An electroencephalogram (EEG). This is a painless test of your brain waves. In this test, a diagram is created of your brain waves. These diagrams can be interpreted by a specialist.  An MRI of the brain.   A CT scan of the brain.   A spinal tap (lumbar puncture, LP).  Blood tests to check for signs of infection or abnormal blood chemistry. TREATMENT  There is no cure for epilepsy, but it is generally treatable. Once epilepsy is diagnosed, it is important to begin treatment as soon as possible. For most people with epilepsy, seizures can be controlled with medicines. The following may also be used:  A pacemaker for the brain (vagus nerve stimulator) can be used for people with seizures that are not well controlled by medicine.  Surgery on the brain. For some people, epilepsy eventually goes away. HOME CARE INSTRUCTIONS   Follow your health care provider's recommendations on driving and safety in normal activities.  Get enough rest. Lack of sleep can cause seizures.  Only take over-the-counter or prescription medicines as directed by your health care provider. Take any prescribed medicine exactly as directed.  Avoid any  known triggers of your seizures.  Keep a seizure diary. Record what you recall about any seizure, especially any possible trigger.   Make sure the people you live and work with know that you are prone to seizures. They should receive instructions on how to help you. In general, a witness to a seizure should:   Cushion your head  and body.   Turn you on your side.   Avoid unnecessarily restraining you.   Not place anything inside your mouth.   Call for emergency medical help if there is any question about what has occurred.   Follow up with your health care provider as directed. You may need regular blood tests to monitor the levels of your medicine.  SEEK MEDICAL CARE IF:   You develop signs of infection or other illness. This might increase the risk of a seizure.   You seem to be having more frequent seizures.   Your seizure pattern is changing.  SEEK IMMEDIATE MEDICAL CARE IF:   You have a seizure that does not stop after a few moments.   You have a seizure that causes any difficulty in breathing.   You have a seizure that results in a very severe headache.   You have a seizure that leaves you with the inability to speak or use a part of your body.    This information is not intended to replace advice given to you by your health care provider. Make sure you discuss any questions you have with your health care provider.   Document Released: 03/07/2005 Document Revised: 12/26/2012 Document Reviewed: 10/17/2012 Elsevier Interactive Patient Education [employer]

## 2015-01-15 NOTE — Telephone Encounter (Signed)
Error

## 2015-01-22 DIAGNOSIS — Z0271 Encounter for disability determination: Secondary | ICD-10-CM

## 2015-02-02 ENCOUNTER — Telehealth: Payer: Self-pay | Admitting: Neurology

## 2015-02-02 NOTE — Telephone Encounter (Signed)
Patient called in to request medication for Carbamazepine 200 mg.  Please call.

## 2015-02-02 NOTE — Telephone Encounter (Signed)
[facility] [phone] called to request refill for Carbamazepine 200mg 

## 2015-02-02 NOTE — Telephone Encounter (Signed)
The carbamazepine has been discontinued, this caused hyponatremia. Please see the allergy list. I called the pharmacy, left a message in this regard.

## 2015-02-02 NOTE — Telephone Encounter (Signed)
Called and spoke to PalmarejoKim. Advised since she is not on DPR form, I cannot disclose any information. Asked to speak w/ pt, but she said she handles everything for him. She is the CNA for him. Advised new DPR needs to be filled out in the office. She understands.

## 2015-02-04 ENCOUNTER — Other Ambulatory Visit: Payer: Self-pay | Admitting: *Deleted

## 2015-02-04 DIAGNOSIS — I10 Essential (primary) hypertension: Secondary | ICD-10-CM

## 2015-02-04 MED ORDER — METOPROLOL TARTRATE 50 MG PO TABS: 25.0000 mg | ORAL_TABLET | Freq: Two times a day (BID) | ORAL | Status: DC

## 2015-02-11 ENCOUNTER — Emergency Department (INDEPENDENT_AMBULATORY_CARE_PROVIDER_SITE_OTHER)
Admission: EM | Admit: 2015-02-11 | Discharge: 2015-02-11 | Disposition: A | Discharge status: Home / Self Care | Payer: Medicaid Other | Source: Home / Self Care | Attending: Family Medicine | Admitting: Family Medicine

## 2015-02-11 ENCOUNTER — Encounter (HOSPITAL_COMMUNITY): Payer: Self-pay | Admitting: Emergency Medicine

## 2015-02-11 DIAGNOSIS — M25522 Pain in left elbow: Secondary | ICD-10-CM | POA: Diagnosis not present

## 2015-02-11 MED ORDER — DICLOFENAC SODIUM 1 % TD GEL: 4.0000 g | Freq: Four times a day (QID) | TRANSDERMAL | Status: DC

## 2015-02-11 NOTE — Discharge Instructions (Signed)
Use gel as prescribed and return if Problem continues

## 2015-02-11 NOTE — ED Provider Notes (Signed)
CSN: [id number]     Arrival date & time 02/11/15  1303 History   First MD Initiated Contact with Patient 02/11/15 1328     Chief Complaint  Patient presents with  . Elbow Pain   (Consider location/radiation/quality/duration/timing/severity/associated sxs/prior Treatment) Patient is a 54 y.o. male presenting with arm injury. The history is provided by the patient.  Arm Injury Location:  Elbow Injury: yes   Mechanism of injury: fall   Mechanism of injury comment:  Seen already for this 89mo old lac to left elbow, still hurts. Elbow location:  L elbow Pain details:    Quality:  Sharp   Severity:  Mild   Onset quality:  Gradual Chronicity:  Chronic Ineffective treatments:  None tried Associated symptoms: decreased range of motion   Associated symptoms comment:  States has been told he has arthritis.   Past Medical History  Diagnosis Date  . Seizure (HCC)   . ETOHism (HCC)   . Aneurysm (HCC)   . Hypertension   . Hepatitis C   . Hereditary and idiopathic peripheral neuropathy 06/03/2014   Past Surgical History  Procedure Laterality Date  . Cerebral aneurysm repair      At [facility]  . I&d extremity Left 10/26/2012    Procedure: IRRIGATION AND DEBRIDEMENT Left Elbow;  Surgeon: Sheral Apley, MD;  Location: [facility] OR;  Service: Orthopedics;  Laterality: Left;  . I&d extremity Left 10/29/2012    Procedure: IRRIGATION AND DEBRIDEMENT EXTREMITY, wound vac change, stimulan beads;  Surgeon: Sheral Apley, MD;  Location: [facility] OR;  Service: Orthopedics;  Laterality: Left;  lateral on bean bag  . Hardware removal Left 10/29/2012    Procedure: HARDWARE REMOVAL;  Surgeon: Sheral Apley, MD;  Location: [facility] OR;  Service: Orthopedics;  Laterality: Left;  . Incision and drainage abscess Left 11/02/2012    Procedure: INCISION AND DRAINAGE ABSCESS;  Surgeon: Sheral Apley, MD;  Location: [facility] OR;  Service: Orthopedics;  Laterality: Left;  . I&d extremity Left 11/02/2012    Procedure:  IRRIGATION AND DEBRIDEMENT EXTREMITY;  Surgeon: Sheral Apley, MD;  Location: [facility] OR;  Service: Orthopedics;  Laterality: Left;   Family History  Problem Relation Age of Onset  . Hypertension Mother   . Cancer Mother   . Hypertension Father   . Diabetes Sister   . Cancer Sister   . Cancer Sister    Social History  Substance Use Topics  . Smoking status: Former Smoker -- 0.25 packs/day    Types: Cigarettes  . Smokeless tobacco: Never Used  . Alcohol Use: No     Comment: quit drinking 11/2011 per patient    Review of Systems  Constitutional: Negative.   Musculoskeletal: Positive for joint swelling.  Skin: Positive for wound.  All other systems reviewed and are negative.   Allergies  Carbamazepine  Home Medications   Prior to Admission medications   Medication Sig Start Date End Date Taking? Authorizing Provider  acetaminophen-codeine (TYLENOL #3) 300-30 MG tablet Take 1 tablet by mouth every 4 (four) hours as needed. 01/05/15   Quentin Angst, MD  diclofenac sodium (VOLTAREN) 1 % GEL Apply 4 g topically 4 (four) times daily. To elbow. Please instruct in dosing. 02/11/15   Linna Hoff, MD  divalproex (DEPAKOTE) 500 MG DR tablet Take 1 tablet (500 mg total) by mouth 2 (two) times daily. 01/15/15   York Spaniel, MD  folic acid (FOLVITE) 1 MG tablet Take 1 tablet (1 mg total) by mouth  daily. 08/15/14   Doris Cheadleeepak Advani, MD  levETIRAcetam (KEPPRA) 750 MG tablet Take 2 tablets (1,500 mg total) by mouth 2 (two) times daily. 12/04/14   Butch PennyMegan Millikan, NP  linezolid (ZYVOX) 600 MG tablet Take 1 tablet (600 mg total) by mouth every 12 (twelve) hours. 11/05/14   Rhetta MuraJai-Gurmukh Samtani, MD  metoprolol (LOPRESSOR) 50 MG tablet Take 0.5 tablets (25 mg total) by mouth 2 (two) times daily. 02/04/15   Quentin Angstlugbemiga E Jegede, MD  nicotine (NICODERM CQ - DOSED IN MG/24 HOURS) 21 mg/24hr patch Place 1 patch (21 mg total) onto the skin daily. 11/05/14   Rhetta MuraJai-Gurmukh Samtani, MD  pregabalin (LYRICA)  50 MG capsule Take 2 tablets in the morning, 1 tablet at noon and 2 tablets at bedtime. 09/03/14   York Spanielharles K [name], MD   Meds Ordered and Administered this Visit  Medications - No data to display  BP 122/73 mmHg  Pulse 73  Temp(Src) 98.2 F (36.8 C) (Oral)  Resp 18  SpO2 99% No data found.   Physical Exam  Constitutional: He is oriented to person, place, and time. He appears well-developed and well-nourished. No distress.  Musculoskeletal: He exhibits tenderness.       Left elbow: He exhibits normal range of motion, no swelling and no effusion.       Arms: Neurological: He is alert and oriented to person, place, and time.  Skin: Skin is warm and dry. No erythema.  Nursing note and vitals reviewed.   ED Course  Procedures (including critical care time)  Labs Review Labs Reviewed - No data to display  Imaging Review No results found.   Visual Acuity Review  Right Eye Distance:   Left Eye Distance:   Bilateral Distance:    Right Eye Near:   Left Eye Near:    Bilateral Near:         MDM   1. Elbow pain, left        Linna HoffJames D Kindl, MD 02/11/15 1350

## 2015-02-11 NOTE — ED Notes (Signed)
Left elbow injury secondary to a seizure.  History of seizures.  Patient reports going to [facility] for treatment, but "didnt do anything".  Appears like a healing wound, but reports extreme pain

## 2015-03-04 DIAGNOSIS — Z0271 Encounter for disability determination: Secondary | ICD-10-CM

## 2015-03-19 ENCOUNTER — Telehealth: Payer: Self-pay | Admitting: Internal Medicine

## 2015-03-19 NOTE — Telephone Encounter (Signed)
Tegretol 200 mg

## 2015-03-20 ENCOUNTER — Other Ambulatory Visit: Payer: Self-pay | Admitting: *Deleted

## 2015-03-20 NOTE — Telephone Encounter (Signed)
Patient verified DOB [name] [name] is patients out patient therapist. Patient gave verbal to speak with therapist. Patient was advised to contact neurology to have this medication refilled. Patient also has appt with neurologist on 04/15/14. Patient advised to call on 03/23/14 at 9:00 am to schedule appt with primary care provider. Patient and therapist expressed their understanding and had no further questions.

## 2015-03-27 ENCOUNTER — Ambulatory Visit: Payer: Medicaid Other | Admitting: Neurology

## 2015-03-28 ENCOUNTER — Other Ambulatory Visit: Payer: Self-pay

## 2015-03-28 MED ORDER — DIVALPROEX SODIUM 500 MG PO DR TAB: 500.0000 mg | DELAYED_RELEASE_TABLET | Freq: Two times a day (BID) | ORAL | Status: DC

## 2015-04-01 ENCOUNTER — Other Ambulatory Visit: Payer: Self-pay | Admitting: Adult Health

## 2015-04-02 ENCOUNTER — Ambulatory Visit: Payer: Self-pay | Admitting: Internal Medicine

## 2015-04-14 NOTE — Telephone Encounter (Signed)
Patients tegretol was discontinued per Stephanie Acre, MD (Neurologist). Medical Assistant spoke with Pharmacy and reiterated doctors orders. Medical Assistant also contacted patient and made him aware of the doctors orders. Patient expressed his concerns of Depakote only giving him occasional relief from his seizures. Medical Assistant reiterated doctors orders and informed patient of the decision to fill medication is based on neurologist findings. Patient expressed his understanding and had no further questions.

## 2015-04-14 NOTE — Telephone Encounter (Signed)
[name] [name] from [facility] called requesting a med refill on Tegretol for the pt. Pt. Is all out of the medication. Please f/u

## 2015-04-16 ENCOUNTER — Ambulatory Visit: Payer: Medicaid Other | Admitting: Adult Health

## 2015-04-20 ENCOUNTER — Ambulatory Visit: Payer: Medicaid Other | Attending: Internal Medicine | Admitting: Internal Medicine

## 2015-04-20 ENCOUNTER — Encounter: Payer: Self-pay | Admitting: Internal Medicine

## 2015-04-20 VITALS — BP 147/82 | HR 61 | Temp 97.8°F | Resp 18 | Ht 77.0 in | Wt 198.0 lb

## 2015-04-20 DIAGNOSIS — B1920 Unspecified viral hepatitis C without hepatic coma: Secondary | ICD-10-CM | POA: Insufficient documentation

## 2015-04-20 DIAGNOSIS — F17200 Nicotine dependence, unspecified, uncomplicated: Secondary | ICD-10-CM

## 2015-04-20 DIAGNOSIS — M549 Dorsalgia, unspecified: Secondary | ICD-10-CM | POA: Diagnosis not present

## 2015-04-20 DIAGNOSIS — Z888 Allergy status to other drugs, medicaments and biological substances status: Secondary | ICD-10-CM | POA: Insufficient documentation

## 2015-04-20 DIAGNOSIS — I10 Essential (primary) hypertension: Secondary | ICD-10-CM | POA: Diagnosis not present

## 2015-04-20 DIAGNOSIS — Z720 Tobacco use: Secondary | ICD-10-CM

## 2015-04-20 DIAGNOSIS — Z79899 Other long term (current) drug therapy: Secondary | ICD-10-CM | POA: Insufficient documentation

## 2015-04-20 DIAGNOSIS — F17210 Nicotine dependence, cigarettes, uncomplicated: Secondary | ICD-10-CM | POA: Diagnosis not present

## 2015-04-20 DIAGNOSIS — G40909 Epilepsy, unspecified, not intractable, without status epilepticus: Secondary | ICD-10-CM | POA: Diagnosis not present

## 2015-04-20 DIAGNOSIS — G894 Chronic pain syndrome: Secondary | ICD-10-CM | POA: Insufficient documentation

## 2015-04-20 DIAGNOSIS — R569 Unspecified convulsions: Secondary | ICD-10-CM | POA: Insufficient documentation

## 2015-04-20 LAB — BASIC METABOLIC PANEL
BUN: 13 mg/dL (ref 7–25)
CHLORIDE: 91 mmol/L — AB (ref 98–110)
CO2: 29 mmol/L (ref 20–31)
Calcium: 8.6 mg/dL (ref 8.6–10.3)
Creat: 0.85 mg/dL (ref 0.70–1.33)
Glucose, Bld: 94 mg/dL (ref 65–99)
POTASSIUM: 4.8 mmol/L (ref 3.5–5.3)
Sodium: 126 mmol/L — ABNORMAL LOW (ref 135–146)

## 2015-04-20 LAB — URIC ACID: URIC ACID, SERUM: 3.3 mg/dL — AB (ref 4.0–7.8)

## 2015-04-20 MED ORDER — ACETAMINOPHEN-CODEINE #3 300-30 MG PO TABS: 1.0000 | ORAL_TABLET | ORAL | Status: DC | PRN

## 2015-04-20 NOTE — Progress Notes (Signed)
Patient ID: [name] [name], male   DOB: 10/06/1960, 55 y.o.   MRN: [id number]   [name] [name], is a 55 y.o. male  [id number]  [id number]  DOB - [birth year]  Chief Complaint  Patient presents with  . Back Pain        Subjective:   [name] [name] is a 55 y.o. male with history of aneurysmal rupture and subsequent seizure disorder, hypertension and hepatitis C here today for a follow up visit. Patient's major concern today is that he would like to go back on Tegretol because that is the only medication that works for him. He claims that since being off Tegretol, his seizure frequency has increased to about 2-3 per week from 1 week. He is present today with his clinical social worker who would not corroborate this story. Patient also claimed that he was not being prescribed the medication that will help him because of his past history of drug abuse. He has no new complaints today but he endorses pain in both of his elbow joints as a result of fall from seizure activities. Sometimes the pain is so much that prevents him from sleeping at night. Patient has No headache, No chest pain, No abdominal pain - No Nausea, No new weakness tingling or numbness, No Cough - SOB.  No problems updated.  ALLERGIES: Allergies  Allergen Reactions  . Carbamazepine     Hyponatremia    PAST MEDICAL HISTORY: Past Medical History  Diagnosis Date  . Seizure (HCC)   . ETOHism (HCC)   . Aneurysm (HCC)   . Hypertension   . Hepatitis C   . Hereditary and idiopathic peripheral neuropathy 06/03/2014    MEDICATIONS AT HOME: Prior to Admission medications   Medication Sig Start Date End Date Taking? Authorizing Provider  thiamine (VITAMIN B-1) 100 MG tablet Take by mouth. 10/27/12  Yes Historical Provider, MD  acetaminophen-codeine (TYLENOL #3) 300-30 MG tablet Take 1 tablet by mouth every 4 (four) hours as needed. 04/20/15   Quentin Angst, MD  diclofenac sodium (VOLTAREN) 1 % GEL Apply 4 g  topically 4 (four) times daily. To elbow. Please instruct in dosing. 02/11/15   Linna Hoff, MD  divalproex (DEPAKOTE) 500 MG DR tablet Take 1 tablet (500 mg total) by mouth 2 (two) times daily. 03/28/15   York Spaniel, MD  FLUoxetine (PROZAC) 10 MG capsule Take by mouth daily. 02/24/15   Historical Provider, MD  folic acid (FOLVITE) 1 MG tablet Take 1 tablet (1 mg total) by mouth daily. 08/15/14   [name], MD  levETIRAcetam (KEPPRA) 750 MG tablet Take 2 tablets (1,500 mg total) by mouth 2 (two) times daily. 04/01/15   Butch Penny, NP  metoprolol (LOPRESSOR) 50 MG tablet Take 0.5 tablets (25 mg total) by mouth 2 (two) times daily. 02/04/15   Quentin Angst, MD  nicotine (NICODERM CQ - DOSED IN MG/24 HOURS) 21 mg/24hr patch Place 1 patch (21 mg total) onto the skin daily. 11/05/14   Rhetta Mura, MD  pregabalin (LYRICA) 50 MG capsule Take 2 tablets in the morning, 1 tablet at noon and 2 tablets at bedtime. 09/03/14   York Spaniel, MD  QUEtiapine (SEROQUEL) 50 MG tablet Take 50 mg by mouth 2 (two) times daily. 04/11/15   Historical Provider, MD     Objective:   Filed Vitals:   04/20/15 1030  BP: 147/82  Pulse: 61  Temp: 97.8 F (36.6 C)  TempSrc: Oral  Resp: 18  Height:  (  1.956 m)  Weight: 198 lb (89.812 kg)  SpO2: 99%    Exam General appearance : Awake, alert, not in any distress. Speech Clear. Not toxic looking, poor dentition HEENT: Atraumatic and Normocephalic, pupils equally reactive to light and accomodation Neck: supple, no JVD. No cervical lymphadenopathy.  Chest:Good air entry bilaterally, no added sounds  CVS: S1 S2 regular, no murmurs.  Abdomen: Bowel sounds present, Non tender and not distended with no gaurding, rigidity or rebound. Extremities: Surgical scar on Left elbow joint posteriorly, Bulging right olecranon process, soft, resembles Tophi, non-tender, no erythema. B/L Lower Ext shows no edema, both legs are warm to touch Neurology: Awake  alert, and oriented X 3, CN II-XII intact, Non focal Skin: No Rash   Data Review Lab Results  Component Value Date   HGBA1C 5.7* 01/24/2014     Assessment & Plan   1. Chronic pain syndrome  - acetaminophen-codeine (TYLENOL #3) 300-30 MG tablet; Take 1 tablet by mouth every 4 (four) hours as needed.  Dispense: 60 tablet; Refill: 0  2. Essential hypertension, benign  We have discussed target BP range and blood pressure goal. I have advised patient to check BP regularly and to call us back or report to clinic if the numbers are consistently higher than 140/90. We discussed the importance of compliance with medical therapy and DASH diet recommended, consequences of uncontrolled hypertension discussed.   - continue current BP medications  3. Seizures (HCC)  - Uric Acid - Basic Metabolic Panel - Drug Screen, Urine - Follow-up with neurologist for the disposition on restarting Tegretol or to continue the current antiepileptic drugs - Of note, Tegretol was discontinued because of hyponatremic side effect  4. Smoking  - Drug Screen, Urine  [name] [name] was counseled on the dangers of tobacco use, and was advised to quit. Reviewed strategies to maximize success, including removing cigarettes and smoking materials from environment, stress management and support of family/friends.  Patient have been counseled extensively about nutrition and exercise  Return in about 6 months (around 10/18/2015) for Seizure Disorder, Follow up HTN.  The patient was given clear instructions to go to ER or return to medical center if symptoms don't improve, worsen or new problems develop. The patient verbalized understanding. The patient was told to call to get lab results if they haven't heard anything in the next week.   This note has been created with Education officer, environmental. Any transcriptional errors are unintentional.    Jeanann Lewandowsky, MD, MHA, FACP, FAAP,  CPE [facility] and [facility] [place], [place] [phone]   04/20/2015, 11:18 AM

## 2015-04-20 NOTE — Patient Instructions (Signed)
DASH Eating Plan DASH stands for "Dietary Approaches to Stop Hypertension." The DASH eating plan is a healthy eating plan that has been shown to reduce high blood pressure (hypertension). Additional health benefits may include reducing the risk of type 2 diabetes mellitus, heart disease, and stroke. The DASH eating plan may also help with weight loss. WHAT DO I NEED TO KNOW ABOUT THE DASH EATING PLAN? For the DASH eating plan, you will follow these general guidelines:  Choose foods with a percent daily value for sodium of less than 5% (as listed on the food label).  Use salt-free seasonings or herbs instead of table salt or sea salt.  Check with your health care provider or pharmacist before using salt substitutes.  Eat lower-sodium products, often labeled as "lower sodium" or "no salt added."  Eat fresh foods.  Eat more vegetables, fruits, and low-fat dairy products.  Choose whole grains. Look for the word "whole" as the first word in the ingredient list.  Choose fish and skinless chicken or turkey more often than red meat. Limit fish, poultry, and meat to 6 oz (170 g) each day.  Limit sweets, desserts, sugars, and sugary drinks.  Choose heart-healthy fats.  Limit cheese to 1 oz (28 g) per day.  Eat more home-cooked food and less restaurant, buffet, and fast food.  Limit fried foods.  Cook foods using methods other than frying.  Limit canned vegetables. If you do use them, rinse them well to decrease the sodium.  When eating at a restaurant, ask that your food be prepared with less salt, or no salt if possible. WHAT FOODS CAN I EAT? Seek help from a dietitian for individual calorie needs. Grains Whole grain or whole wheat bread. Brown rice. Whole grain or whole wheat pasta. Quinoa, bulgur, and whole grain cereals. Low-sodium cereals. Corn or whole wheat flour tortillas. Whole grain cornbread. Whole grain crackers. Low-sodium crackers. Vegetables Fresh or frozen vegetables  (raw, steamed, roasted, or grilled). Low-sodium or reduced-sodium tomato and vegetable juices. Low-sodium or reduced-sodium tomato sauce and paste. Low-sodium or reduced-sodium canned vegetables.  Fruits All fresh, canned (in natural juice), or frozen fruits. Meat and Other Protein Products Ground beef (85% or leaner), grass-fed beef, or beef trimmed of fat. Skinless chicken or turkey. Ground chicken or turkey. Pork trimmed of fat. All fish and seafood. Eggs. Dried beans, peas, or lentils. Unsalted nuts and seeds. Unsalted canned beans. Dairy Low-fat dairy products, such as skim or 1% milk, 2% or reduced-fat cheeses, low-fat ricotta or cottage cheese, or plain low-fat yogurt. Low-sodium or reduced-sodium cheeses. Fats and Oils Tub margarines without trans fats. Light or reduced-fat mayonnaise and salad dressings (reduced sodium). Avocado. Safflower, olive, or canola oils. Natural peanut or almond butter. Other Unsalted popcorn and pretzels. The items listed above may not be a complete list of recommended foods or beverages. Contact your dietitian for more options. WHAT FOODS ARE NOT RECOMMENDED? Grains White bread. White pasta. White rice. Refined cornbread. Bagels and croissants. Crackers that contain trans fat. Vegetables Creamed or fried vegetables. Vegetables in a cheese sauce. Regular canned vegetables. Regular canned tomato sauce and paste. Regular tomato and vegetable juices. Fruits Dried fruits. Canned fruit in light or heavy syrup. Fruit juice. Meat and Other Protein Products Fatty cuts of meat. Ribs, chicken wings, bacon, sausage, bologna, salami, chitterlings, fatback, hot dogs, bratwurst, and packaged luncheon meats. Salted nuts and seeds. Canned beans with salt. Dairy Whole or 2% milk, cream, half-and-half, and cream cheese. Whole-fat or sweetened yogurt. Full-fat   cheeses or blue cheese. Nondairy creamers and whipped toppings. Processed cheese, cheese spreads, or cheese  curds. Condiments Onion and garlic salt, seasoned salt, table salt, and sea salt. Canned and packaged gravies. Worcestershire sauce. Tartar sauce. Barbecue sauce. Teriyaki sauce. Soy sauce, including reduced sodium. Steak sauce. Fish sauce. Oyster sauce. Cocktail sauce. Horseradish. Ketchup and mustard. Meat flavorings and tenderizers. Bouillon cubes. Hot sauce. Tabasco sauce. Marinades. Taco seasonings. Relishes. Fats and Oils Butter, stick margarine, lard, shortening, ghee, and bacon fat. Coconut, palm kernel, or palm oils. Regular salad dressings. Other Pickles and olives. Salted popcorn and pretzels. The items listed above may not be a complete list of foods and beverages to avoid. Contact your dietitian for more information. WHERE CAN I FIND MORE INFORMATION? National [employer], [employer], [employer]: www.nhlbi.nih.gov/health/health-topics/topics/dash/   This information is not intended to replace advice given to you by your health care provider. Make sure you discuss any questions you have with your health care provider.   Document Released: 02/24/2011 Document Revised: 03/28/2014 Document Reviewed: 01/09/2013 Elsevier Interactive Patient Education 2016 [employer] Hypertension Hypertension, commonly called high blood pressure, is when the force of blood pumping through your arteries is too strong. Your arteries are the blood vessels that carry blood from your heart throughout your body. A blood pressure reading consists of a higher number over a lower number, such as 110/72. The higher number (systolic) is the pressure inside your arteries when your heart pumps. The lower number (diastolic) is the pressure inside your arteries when your heart relaxes. Ideally you want your blood pressure below 120/80. Hypertension forces your heart to work harder to pump blood. Your arteries may become narrow or stiff. Having untreated or uncontrolled hypertension can cause heart attack, stroke, kidney  disease, and other problems. RISK FACTORS Some risk factors for high blood pressure are controllable. Others are not.  Risk factors you cannot control include:   Race. You may be at higher risk if you are African American.  Age. Risk increases with age.  Gender. Men are at higher risk than women before age 45 years. After age 65, women are at higher risk than men. Risk factors you can control include:  Not getting enough exercise or physical activity.  Being overweight.  Getting too much fat, sugar, calories, or salt in your diet.  Drinking too much alcohol. SIGNS AND SYMPTOMS Hypertension does not usually cause signs or symptoms. Extremely high blood pressure (hypertensive crisis) may cause headache, anxiety, shortness of breath, and nosebleed. DIAGNOSIS To check if you have hypertension, your health care provider will measure your blood pressure while you are seated, with your arm held at the level of your heart. It should be measured at least twice using the same arm. Certain conditions can cause a difference in blood pressure between your right and left arms. A blood pressure reading that is higher than normal on one occasion does not mean that you need treatment. If it is not clear whether you have high blood pressure, you may be asked to return on a different day to have your blood pressure checked again. Or, you may be asked to monitor your blood pressure at home for 1 or more weeks. TREATMENT Treating high blood pressure includes making lifestyle changes and possibly taking medicine. Living a healthy lifestyle can help lower high blood pressure. You may need to change some of your habits. Lifestyle changes may include:  Following the DASH diet. This diet is high in fruits, vegetables, and whole grains.   It is low in salt, red meat, and added sugars.  Keep your sodium intake below 2,300 mg per day.  Getting at least 30-45 minutes of aerobic exercise at least 4 times per  week.  Losing weight if necessary.  Not smoking.  Limiting alcoholic beverages.  Learning ways to reduce stress. Your health care provider may prescribe medicine if lifestyle changes are not enough to get your blood pressure under control, and if one of the following is true:  You are 79-65 years of age and your systolic blood pressure is above 140.  You are 68 years of age or older, and your systolic blood pressure is above 150.  Your diastolic blood pressure is above 90.  You have diabetes, and your systolic blood pressure is over 140 or your diastolic blood pressure is over 90.  You have kidney disease and your blood pressure is above 140/90.  You have heart disease and your blood pressure is above 140/90. Your personal target blood pressure may vary depending on your medical conditions, your age, and other factors. HOME CARE INSTRUCTIONS  Have your blood pressure rechecked as directed by your health care provider.   Take medicines only as directed by your health care provider. Follow the directions carefully. Blood pressure medicines must be taken as prescribed. The medicine does not work as well when you skip doses. Skipping doses also puts you at risk for problems.  Do not smoke.   Monitor your blood pressure at home as directed by your health care provider. SEEK MEDICAL CARE IF:   You think you are having a reaction to medicines taken.  You have recurrent headaches or feel dizzy.  You have swelling in your ankles.  You have trouble with your vision. SEEK IMMEDIATE MEDICAL CARE IF:  You develop a severe headache or confusion.  You have unusual weakness, numbness, or feel faint.  You have severe chest or abdominal pain.  You vomit repeatedly.  You have trouble breathing. MAKE SURE YOU:   Understand these instructions.  Will watch your condition.  Will get help right away if you are not doing well or get worse.   This information is not intended to  replace advice given to you by your health care provider. Make sure you discuss any questions you have with your health care provider.   Document Released: 03/07/2005 Document Revised: 07/22/2014 Document Reviewed: 12/28/2012 Elsevier Interactive Patient Education 2016 [employer]. Epilepsy Epilepsy is a disorder in which a person has repeated seizures over time. A seizure is a release of abnormal electrical activity in the brain. Seizures can cause a change in attention, behavior, or the ability to remain awake and alert (altered mental status). Seizures often involve uncontrollable shaking (convulsions).  Most people with epilepsy lead normal lives. However, people with epilepsy are at an increased risk of falls, accidents, and injuries. Therefore, it is important to begin treatment right away. CAUSES  Epilepsy has many possible causes. Anything that disturbs the normal pattern of brain cell activity can lead to seizures. This may include:   Head injury.  Birth trauma.  High fever as a child.  Stroke.  Bleeding into or around the brain.  Certain drugs.  Prolonged low oxygen, such as what occurs after CPR efforts.  Abnormal brain development.  Certain illnesses, such as meningitis, encephalitis (brain infection), malaria, and other infections.  An imbalance of nerve signaling chemicals (neurotransmitters).  SIGNS AND SYMPTOMS  The symptoms of a seizure can vary greatly from one person  to another. Right before a seizure, you may have a warning (aura) that a seizure is about to occur. An aura may include the following symptoms:  Fear or anxiety.  Nausea.  Feeling like the room is spinning (vertigo).  Vision changes, such as seeing flashing lights or spots. Common symptoms during a seizure include:  Abnormal sensations, such as an abnormal smell or a bitter taste in the mouth.   Sudden, general body stiffness.   Convulsions that involve rhythmic jerking of the face,  arm, or leg on one or both sides.   Sudden change in consciousness.   Appearing to be awake but not responding.   Appearing to be asleep but cannot be awakened.   Grimacing, chewing, lip smacking, drooling, tongue biting, or loss of bowel or bladder control. After a seizure, you may feel sleepy for a while. DIAGNOSIS  Your health care provider will ask about your symptoms and take a medical history. Descriptions from any witnesses to your seizures will be very helpful in the diagnosis. A physical exam, including a detailed neurological exam, is necessary. Various tests may be done, such as:   An electroencephalogram (EEG). This is a painless test of your brain waves. In this test, a diagram is created of your brain waves. These diagrams can be interpreted by a specialist.  An MRI of the brain.   A CT scan of the brain.   A spinal tap (lumbar puncture, LP).  Blood tests to check for signs of infection or abnormal blood chemistry. TREATMENT  There is no cure for epilepsy, but it is generally treatable. Once epilepsy is diagnosed, it is important to begin treatment as soon as possible. For most people with epilepsy, seizures can be controlled with medicines. The following may also be used:  A pacemaker for the brain (vagus nerve stimulator) can be used for people with seizures that are not well controlled by medicine.  Surgery on the brain. For some people, epilepsy eventually goes away. HOME CARE INSTRUCTIONS   Follow your health care provider's recommendations on driving and safety in normal activities.  Get enough rest. Lack of sleep can cause seizures.  Only take over-the-counter or prescription medicines as directed by your health care provider. Take any prescribed medicine exactly as directed.  Avoid any known triggers of your seizures.  Keep a seizure diary. Record what you recall about any seizure, especially any possible trigger.   Make sure the people you live  and work with know that you are prone to seizures. They should receive instructions on how to help you. In general, a witness to a seizure should:   Cushion your head and body.   Turn you on your side.   Avoid unnecessarily restraining you.   Not place anything inside your mouth.   Call for emergency medical help if there is any question about what has occurred.   Follow up with your health care provider as directed. You may need regular blood tests to monitor the levels of your medicine.  SEEK MEDICAL CARE IF:   You develop signs of infection or other illness. This might increase the risk of a seizure.   You seem to be having more frequent seizures.   Your seizure pattern is changing.  SEEK IMMEDIATE MEDICAL CARE IF:   You have a seizure that does not stop after a few moments.   You have a seizure that causes any difficulty in breathing.   You have a seizure that results in a  very severe headache.   You have a seizure that leaves you with the inability to speak or use a part of your body.    This information is not intended to replace advice given to you by your health care provider. Make sure you discuss any questions you have with your health care provider.   Document Released: 03/07/2005 Document Revised: 12/26/2012 Document Reviewed: 10/17/2012 Elsevier Interactive Patient Education [employer]

## 2015-04-20 NOTE — Progress Notes (Signed)
Patient here for Back Pain  Patient complains of Lower back pain and bilateral elbow pain being scaled currently at a 9. Patient has scaly left elbow and inflamed right elbow. Patient states symptom has been present for 4 months.  Patient declines flu shot at this time.  Patient has psychotherapeutic representative present with him at visit. Release form has been provided.

## 2015-04-21 ENCOUNTER — Telehealth: Payer: Self-pay | Admitting: *Deleted

## 2015-04-21 LAB — DRUG SCREEN, URINE
AMPHETAMINE SCRN UR: NEGATIVE
Barbiturate Quant, Ur: NEGATIVE
Benzodiazepines.: NEGATIVE
CREATININE, U: 289.68 mg/dL
Cocaine Metabolites: NEGATIVE
MARIJUANA METABOLITE: NEGATIVE
Methadone: NEGATIVE
Opiates: NEGATIVE
PHENCYCLIDINE (PCP): NEGATIVE
Propoxyphene: NEGATIVE

## 2015-04-21 NOTE — Telephone Encounter (Signed)
-----   Message from Quentin Angst, MD sent at 04/21/2015  3:55 PM EST ----- Please inform patient that his laboratory results shows that his sodium level is very low which is a side effect of Tegretol. Advised patient to stop this medication, follow up with the neurologist as scheduled.

## 2015-04-21 NOTE — Telephone Encounter (Signed)
Patient verified DOB Patient informed of lab results showing his sodium level is very low due to patient continuing tegretol outside of specialist recommendation. Patient advised to stop tegretol if any medication is left and to keep is FU appointment with Neuro on 04/22/15 at 9:15. Patient stated he does not receive his check until 12:00. Medical Assistant advised patient to keep appointment even if he does not have the copay in hand. Patient expressed his understanding and had no further questions.

## 2015-04-22 ENCOUNTER — Ambulatory Visit: Payer: Medicaid Other | Admitting: Adult Health

## 2015-04-23 ENCOUNTER — Encounter: Payer: Self-pay | Admitting: Adult Health

## 2015-04-28 ENCOUNTER — Encounter: Payer: Self-pay | Admitting: Internal Medicine

## 2015-04-30 ENCOUNTER — Ambulatory Visit (INDEPENDENT_AMBULATORY_CARE_PROVIDER_SITE_OTHER): Payer: Medicaid Other | Admitting: Adult Health

## 2015-04-30 ENCOUNTER — Encounter: Payer: Self-pay | Admitting: Adult Health

## 2015-04-30 VITALS — BP 134/75 | HR 62 | Ht 77.0 in | Wt 198.5 lb

## 2015-04-30 DIAGNOSIS — R413 Other amnesia: Secondary | ICD-10-CM

## 2015-04-30 DIAGNOSIS — R569 Unspecified convulsions: Secondary | ICD-10-CM | POA: Diagnosis not present

## 2015-04-30 DIAGNOSIS — Z5181 Encounter for therapeutic drug level monitoring: Secondary | ICD-10-CM

## 2015-04-30 DIAGNOSIS — R269 Unspecified abnormalities of gait and mobility: Secondary | ICD-10-CM

## 2015-04-30 NOTE — Progress Notes (Signed)
PATIENT: [name] [name] DOB: [birth year]  REASON FOR VISIT: follow up HISTORY FROM: patient  HISTORY OF PRESENT ILLNESS:  [name] [name] is a 55 year old male with a history of a ruptured cerebral aneurysm and subsequent seizures. He returns today for follow-up. The patient does not really know what medications he is on. At first he states that he still taking Tegretol and not Depakote. By the end of the visit  he states he is taking Depakote but not Tegretol. The patient's social worker is with him. She states that he is still on  Tegretol. She states that he will not let them look at his medications when they come out to his home. She also states that he is very aggressive and agitated and  at times that they do not enter the home. The patient states that he continue to have at least one seizure a week. The patient is now seeing a psychiatrist. The patient also carries a diagnosis of peripheral neuropathy and is on Lyrica. The patient does confirm he is still taking Keppra. We received the patient's dispense medications from the pharmacy. It appears that the patient has been getting Tegretol from Dr. Orpah Cobb.  He is also picked up his Depakote and Keppra But not Lyrica. The question is if he is actually taking any of these medications. In the past there has been ongoing confusion surrounding his medications. The patient lives in a group home. The patient's medication is dispensed in a  bubble wrap. The patient's social worker is with him today. She states that he has trouble with his gait. She states he is very unsteady and they're wondering if he should have a walker or cane. Patient agrees.  He returns today for evaluation.  HISTORY 01/15/15: [name] [name] is a 55 year old right-handed white male with a history of a ruptured cerebral aneurysm in the past, and subsequent seizures. The patient has had intractable seizures. He has been on carbamazepine and Keppra, but the carbamazepine resulted in  hyponatremia requiring cessation of the drug. The patient has been on Lyrica for neuropathy pain. He continues to have seizures with some regularity, averaging 1 a week. The last seizure was 3 or 4 days ago. The patient does not operate a motor vehicle. He indicates that he has a nurse who comes into his home 5 days a week to ensure that he is getting his medications properly. The patient has a pill dispenser. He returns to this office for further evaluation.  REVIEW OF SYSTEMS: Out of a complete 14 system review of symptoms, the patient complains only of the following symptoms, and all other reviewed systems are negative.   incontinence of bladder, frequency of urination, joint pain, leg slowing, agitation, confusion, depression, speech difficulty, seizure , headache, dizziness, memory loss, bruise/bleed easily  ALLERGIES: Allergies  Allergen Reactions  . Carbamazepine     Hyponatremia    HOME MEDICATIONS: Outpatient Prescriptions Prior to Visit  Medication Sig Dispense Refill  . acetaminophen-codeine (TYLENOL #3) 300-30 MG tablet Take 1 tablet by mouth every 4 (four) hours as needed. 60 tablet 0  . diclofenac sodium (VOLTAREN) 1 % GEL Apply 4 g topically 4 (four) times daily. To elbow. Please instruct in dosing. 1 Tube 2  . FLUoxetine (PROZAC) 10 MG capsule Take by mouth daily.  1  . folic acid (FOLVITE) 1 MG tablet Take 1 tablet (1 mg total) by mouth daily. 30 tablet 2  . levETIRAcetam (KEPPRA) 750 MG tablet Take 2 tablets (1,500 mg total)  by mouth 2 (two) times daily. 120 tablet 0  . metoprolol (LOPRESSOR) 50 MG tablet Take 0.5 tablets (25 mg total) by mouth 2 (two) times daily. 60 tablet 3  . nicotine (NICODERM CQ - DOSED IN MG/24 HOURS) 21 mg/24hr patch Place 1 patch (21 mg total) onto the skin daily. 28 patch 0  . pregabalin (LYRICA) 50 MG capsule Take 2 tablets in the morning, 1 tablet at noon and 2 tablets at bedtime. 150 capsule 5  . QUEtiapine (SEROQUEL) 50 MG tablet Take 50 mg by  mouth 2 (two) times daily.  1  . thiamine (VITAMIN B-1) 100 MG tablet Take by mouth.    . divalproex (DEPAKOTE) 500 MG DR tablet Take 1 tablet (500 mg total) by mouth 2 (two) times daily. (Patient not taking: Reported on 04/30/2015) 60 tablet 1   No facility-administered medications prior to visit.    PAST MEDICAL HISTORY: Past Medical History  Diagnosis Date  . Seizure (HCC)   . ETOHism (HCC)   . Aneurysm (HCC)   . Hypertension   . Hepatitis C   . Hereditary and idiopathic peripheral neuropathy 06/03/2014    PAST SURGICAL HISTORY: Past Surgical History  Procedure Laterality Date  . Cerebral aneurysm repair      At [facility]  . I&d extremity Left 10/26/2012    Procedure: IRRIGATION AND DEBRIDEMENT Left Elbow;  Surgeon: Sheral Apley, MD;  Location: [facility] OR;  Service: Orthopedics;  Laterality: Left;  . I&d extremity Left 10/29/2012    Procedure: IRRIGATION AND DEBRIDEMENT EXTREMITY, wound vac change, stimulan beads;  Surgeon: Sheral Apley, MD;  Location: [facility] OR;  Service: Orthopedics;  Laterality: Left;  lateral on bean bag  . Hardware removal Left 10/29/2012    Procedure: HARDWARE REMOVAL;  Surgeon: Sheral Apley, MD;  Location: [facility] OR;  Service: Orthopedics;  Laterality: Left;  . Incision and drainage abscess Left 11/02/2012    Procedure: INCISION AND DRAINAGE ABSCESS;  Surgeon: Sheral Apley, MD;  Location: [facility] OR;  Service: Orthopedics;  Laterality: Left;  . I&d extremity Left 11/02/2012    Procedure: IRRIGATION AND DEBRIDEMENT EXTREMITY;  Surgeon: Sheral Apley, MD;  Location: [facility] OR;  Service: Orthopedics;  Laterality: Left;    FAMILY HISTORY: Family History  Problem Relation Age of Onset  . Hypertension Mother   . Cancer Mother   . Hypertension Father   . Diabetes Sister   . Cancer Sister   . Cancer Sister     SOCIAL HISTORY: Social History   Social History  . Marital Status: Single    Spouse Name: N/A  . Number of Children: 3  . Years of Education: 11  th   Occupational History  . disabled    Social History Main Topics  . Smoking status: Former Smoker -- 0.25 packs/day    Types: Cigarettes  . Smokeless tobacco: Never Used  . Alcohol Use: No     Comment: quit drinking 11/2011 per patient  . Drug Use: No  . Sexual Activity: Not Currently   Other Topics Concern  . Not on file   Social History Narrative   Patient is single and lives at home alone.   Disabled.   Education 11 th grade.   Right handed.   Caffeine none .      PHYSICAL EXAM  Filed Vitals:   04/30/15 1333  BP: 134/75  Pulse: 62  Height:  (1.956 m)  Weight: 198 lb 8 oz (90.039 kg)  Body mass index is 23.53 kg/(m^2).   MMSE - Mini Mental State Exam 04/30/2015  Orientation to time 4  Orientation to Place 3  Registration 3  Attention/ Calculation 0  Recall 2  Language- name 2 objects 2  Language- repeat 1  Language- follow 3 step command 3  Language- read & follow direction 1  Write a sentence 1  Copy design 1  Total score 21     Generalized: Well developed, in no acute distress   Neurological examination  Mentation: Alert . Follows all commands speech and language fluent. Patient is very agitated and sometimes aggressive with his conversations. Cranial nerve II-XII: Pupils were equal round reactive to light. Extraocular movements were full, visual field were full on confrontational test. Facial sensation and strength were normal. Uvula tongue midline. Head turning and shoulder shrug  were normal and symmetric. Motor: The motor testing reveals 5 over 5 strength of all 4 extremities. Good symmetric motor tone is noted throughout.  Sensory: Sensory testing is intact to soft touch on all 4 extremities. No evidence of extinction is noted.  Coordination: Cerebellar testing reveals good finger-nose-finger and heel-to-shin bilaterally.  Gait and station:  Patient's gait is wide-based. Tandem gait not attempted. Reflexes: Deep tendon reflexes are  symmetric and normal bilaterally.   DIAGNOSTIC DATA (LABS, IMAGING, TESTING) - I reviewed patient records, labs, notes, testing and imaging myself where available.  Lab Results  Component Value Date   WBC 7.4 12/04/2014   HGB 11.7* 11/01/2014   HCT 40.0 12/04/2014   MCV 101* 12/04/2014   PLT 179 12/04/2014      Component Value Date/Time   NA 126* 04/20/2015 1118   NA 138 12/04/2014 1023   K 4.8 04/20/2015 1118   CL 91* 04/20/2015 1118   CO2 29 04/20/2015 1118   GLUCOSE 94 04/20/2015 1118   GLUCOSE 91 12/04/2014 1023   BUN 13 04/20/2015 1118   BUN 12 12/04/2014 1023   CREATININE 0.85 04/20/2015 1118   CREATININE 0.72* 12/04/2014 1023   CALCIUM 8.6 04/20/2015 1118   PROT 7.4 12/04/2014 1023   PROT 7.1 10/31/2014 1840   ALBUMIN 4.4 12/04/2014 1023   ALBUMIN 3.4* 10/31/2014 1840   AST 32 12/04/2014 1023   ALT 33 12/04/2014 1023   ALKPHOS 114 12/04/2014 1023   BILITOT <0.2 12/04/2014 1023   BILITOT 0.4 10/31/2014 1840   GFRNONAA 105 12/04/2014 1023   GFRNONAA >89 03/10/2014 1504   GFRAA 121 12/04/2014 1023   GFRAA >89 03/10/2014 1504    ASSESSMENT AND PLAN 55 y.o. year old male  has a past medical history of Seizure (HCC); ETOHism (HCC); Aneurysm (HCC); Hypertension; Hepatitis C; and Hereditary and idiopathic peripheral neuropathy (06/03/2014). here with:   1. Seizures  2.  Memory disturbance 3.  Abnormality of gait   I'm unsure what medication the patient is taking for his seizures. I'm not sure that the patient is capable of handling his medications. This has been an ongoing concern. The patient is very resistant to  any type of care facility. He will also not let the ongoing care from this agency  Help him with his medication. I will check blood work and drug levels to see what medication  he is actually on. The patient should not be on carbamazepine due to  Hyponatremia in the past. The patient's MMSE is 21/30. We will continue to monitor his memory. The patient may  benefit from physical therapy. I am unsure if he will allow then to  help him.  I will make a referral. Patient will follow-up in 3 months or sooner if needed.   Social Worker: Thalia Party LCSW, [employer] + Team [employer] [phone]   Butch Penny, MSN, NP-C 04/30/2015, 2:27 PM [facility] [address], [address] [place], [place] [address] [phone]

## 2015-04-30 NOTE — Patient Instructions (Signed)
Blood work today If your symptoms worsen or you develop new symptoms please let us know.   

## 2015-04-30 NOTE — Progress Notes (Signed)
I have read the note, and I agree with the clinical assessment and plan.  [name] [name],[name] [name]   

## 2015-05-03 LAB — CARBAMAZEPINE LEVEL, TOTAL: Carbamazepine (Tegretol), S: 9.7 ug/mL (ref 4.0–12.0)

## 2015-05-03 LAB — COMPREHENSIVE METABOLIC PANEL
A/G RATIO: 1.8 (ref 1.1–2.5)
ALBUMIN: 4.4 g/dL (ref 3.5–5.5)
ALT: 33 IU/L (ref 0–44)
AST: 41 IU/L — ABNORMAL HIGH (ref 0–40)
Alkaline Phosphatase: 89 IU/L (ref 39–117)
BILIRUBIN TOTAL: 0.2 mg/dL (ref 0.0–1.2)
BUN / CREAT RATIO: 10 (ref 9–20)
BUN: 7 mg/dL (ref 6–24)
CHLORIDE: 89 mmol/L — AB (ref 96–106)
CO2: 25 mmol/L (ref 18–29)
Calcium: 8.9 mg/dL (ref 8.7–10.2)
Creatinine, Ser: 0.71 mg/dL — ABNORMAL LOW (ref 0.76–1.27)
GFR calc non Af Amer: 105 mL/min/{1.73_m2} (ref >59)
GFR, EST AFRICAN AMERICAN: 121 mL/min/{1.73_m2} (ref >59)
GLOBULIN, TOTAL: 2.5 g/dL (ref 1.5–4.5)
GLUCOSE: 98 mg/dL (ref 65–99)
POTASSIUM: 4.8 mmol/L (ref 3.5–5.2)
Sodium: 129 mmol/L — ABNORMAL LOW (ref 134–144)
TOTAL PROTEIN: 6.9 g/dL (ref 6.0–8.5)

## 2015-05-03 LAB — CBC WITH DIFFERENTIAL/PLATELET
BASOS ABS: 0 10*3/uL (ref 0.0–0.2)
Basos: 0 %
EOS (ABSOLUTE): 0.1 10*3/uL (ref 0.0–0.4)
Eos: 1 %
Hematocrit: 39.6 % (ref 37.5–51.0)
Hemoglobin: 14.4 g/dL (ref 12.6–17.7)
Immature Grans (Abs): 0 10*3/uL (ref 0.0–0.1)
Immature Granulocytes: 0 %
LYMPHS ABS: 3.3 10*3/uL — AB (ref 0.7–3.1)
Lymphs: 69 %
MCH: 35.7 pg — AB (ref 26.6–33.0)
MCHC: 36.4 g/dL — ABNORMAL HIGH (ref 31.5–35.7)
MCV: 98 fL — AB (ref 79–97)
MONOCYTES: 9 %
Monocytes Absolute: 0.5 10*3/uL (ref 0.1–0.9)
NEUTROS ABS: 1 10*3/uL — AB (ref 1.4–7.0)
Neutrophils: 21 %
PLATELETS: 183 10*3/uL (ref 150–379)
RBC: 4.03 x10E6/uL — ABNORMAL LOW (ref 4.14–5.80)
RDW: 12.9 % (ref 12.3–15.4)
WBC: 4.9 10*3/uL (ref 3.4–10.8)

## 2015-05-03 LAB — VALPROIC ACID LEVEL: Valproic Acid Lvl: 56 ug/mL (ref 50–100)

## 2015-05-03 LAB — LEVETIRACETAM LEVEL: Levetiracetam Lvl: 13.5 ug/mL (ref 10.0–40.0)

## 2015-05-05 ENCOUNTER — Telehealth: Payer: Self-pay | Admitting: Adult Health

## 2015-05-05 MED ORDER — CARBAMAZEPINE 200 MG PO TABS: ORAL_TABLET | ORAL | Status: DC

## 2015-05-05 MED ORDER — DIVALPROEX SODIUM 500 MG PO DR TAB: DELAYED_RELEASE_TABLET | ORAL | Status: DC

## 2015-05-05 NOTE — Telephone Encounter (Signed)
I called the patient and left a voicemail on his cell phone and home phone. I've asked him to call our office. I also called the patient's social worker and left a message for her to call our office as well.  The patient's blood work indicates that he is taking Tegretol, Depakote and Keppra. The patient does have hyponatremia most likely due to the Tegretol. In the past he's been asked to stop this medication but it appears that is still being prescribed by his primary care provider.  I called the pharmacy his dosing is 200 mg tablet- 2 tablets 3 times a day. This medication needs to be weaned off and we will increase the Depakote. There is still concern that the patient is unable to manage his medication and does not allow the social workers to assist him.

## 2015-05-05 NOTE — Telephone Encounter (Signed)
I spoke to the patient's social worker. I explained that the patient is taking Tegretol. This medication needs to be discontinued due to hyponatremia. The patient will wean off this medication. He is currently taking 200 mg 2 tablets 3 times a day. He will decrease to 1 tablet 3 times a day for one week then 1/2 tablet 3 times a day for one week then a half a tablet twice a day for 1 week then stop the medication. I have sent in a new prescription to his pharmacy since his medications are bubble wrapped. The patient will increase Depakote 500 mg to 1 tablet in the morning and 2 tablets at bedtime. The social worker verbalized understanding. She states that she will also follow up with the pharmacy to ensure these medications are bubble wrapped appropriately. Social worker also states that she is unsure if the patient is truly having seizure events as he reports. She states that they are always unwitnessed. The patient never calls EMS and he normally tells her agency days or weeks after the event. This is noted however if the patient is truly having seizures weaning the carbamazepine could cause additional seizures and this is the reasoning for increasing Depakote. Social worker verbalized understanding. I will continue to try to call the patient to go over these instructions.

## 2015-05-05 NOTE — Telephone Encounter (Signed)
[place] with [facility] is returning Megan's call and can be reached at [phone].

## 2015-05-06 ENCOUNTER — Other Ambulatory Visit: Payer: Self-pay | Admitting: *Deleted

## 2015-05-06 ENCOUNTER — Other Ambulatory Visit: Payer: Self-pay | Admitting: Adult Health

## 2015-05-06 DIAGNOSIS — R569 Unspecified convulsions: Secondary | ICD-10-CM

## 2015-05-07 MED ORDER — CARBAMAZEPINE 200 MG PO TABS: ORAL_TABLET | ORAL | Status: DC

## 2015-05-07 NOTE — Telephone Encounter (Signed)
I called the patient's Child psychotherapist. She was inquiring about a physical therapy referral. I explained that I did make a referral however when the scheduler at neuro rehabilitation called the patient he states that he can make appointment until he knows he has transportation. I provided the social worker with neuro rehabilitation's phone number. She plans to call and set him up his appointments.  The social worker also reports that she can't and verified with the pharmacy that they have a new prescription for Tegretol. They will be holding the patient's medication 1 week at a time to ensure that he does the Tegretol titration. She states that she is has already reviewed these changes with the patient.

## 2015-05-07 NOTE — Telephone Encounter (Signed)
[name] [name] with [facility] is calling regarding the patient. She says she has questions about the patient's referral for physical therapy. She can be reached at [phone].

## 2015-05-15 ENCOUNTER — Encounter: Payer: Self-pay | Admitting: Rehabilitation

## 2015-05-15 ENCOUNTER — Ambulatory Visit: Payer: Medicaid Other | Attending: Adult Health | Admitting: Rehabilitation

## 2015-05-15 ENCOUNTER — Telehealth: Payer: Self-pay | Admitting: Rehabilitation

## 2015-05-15 DIAGNOSIS — R531 Weakness: Secondary | ICD-10-CM | POA: Diagnosis present

## 2015-05-15 DIAGNOSIS — R269 Unspecified abnormalities of gait and mobility: Secondary | ICD-10-CM

## 2015-05-15 DIAGNOSIS — R2689 Other abnormalities of gait and mobility: Secondary | ICD-10-CM

## 2015-05-15 DIAGNOSIS — G609 Hereditary and idiopathic neuropathy, unspecified: Secondary | ICD-10-CM | POA: Insufficient documentation

## 2015-05-15 NOTE — Telephone Encounter (Signed)
Dr. Ethelene Browns,   I evaluated Mr. [name] [name] today at [facility].  Based on evaluation findings, feel that he would be safer and decreased fall risk if he were to use a RW.  Please place order if you agree and I can provide for him on next visit to take to [facility].     Thanks,  Harriet Butte, PT, MPT [facility] [address] [place], [place], [address] Phone: [phone]   Fax:  [phone] 05/15/2015, 2:19 PM

## 2015-05-15 NOTE — Therapy (Signed)
[facility] [address] [place], [place], [address] Phone: [phone]   Fax:  [phone]  Physical Therapy Evaluation  Patient Details  Name: [name] [name] MRN: [id number] Date of Birth: January 30, 1961 Referring Provider: Butch Penny, NP  Encounter Date: 05/15/2015      PT End of Session - 05/15/15 1305    Visit Number 1   Number of Visits 4   Date for PT Re-Evaluation 07/14/15   Authorization Type MCD (awaiting approval)   PT Start Time 0933   PT Stop Time 1018   PT Time Calculation (min) 45 min   Activity Tolerance Patient tolerated treatment well   Behavior During Therapy [facility] for tasks assessed/performed      Past Medical History  Diagnosis Date  . Seizure (HCC)   . ETOHism (HCC)   . Aneurysm (HCC)   . Hypertension   . Hepatitis C   . Hereditary and idiopathic peripheral neuropathy 06/03/2014    Past Surgical History  Procedure Laterality Date  . Cerebral aneurysm repair      At [facility]  . I&d extremity Left 10/26/2012    Procedure: IRRIGATION AND DEBRIDEMENT Left Elbow;  Surgeon: Sheral Apley, MD;  Location: [facility] OR;  Service: Orthopedics;  Laterality: Left;  . I&d extremity Left 10/29/2012    Procedure: IRRIGATION AND DEBRIDEMENT EXTREMITY, wound vac change, stimulan beads;  Surgeon: Sheral Apley, MD;  Location: [facility] OR;  Service: Orthopedics;  Laterality: Left;  lateral on bean bag  . Hardware removal Left 10/29/2012    Procedure: HARDWARE REMOVAL;  Surgeon: Sheral Apley, MD;  Location: [facility] OR;  Service: Orthopedics;  Laterality: Left;  . Incision and drainage abscess Left 11/02/2012    Procedure: INCISION AND DRAINAGE ABSCESS;  Surgeon: Sheral Apley, MD;  Location: [facility] OR;  Service: Orthopedics;  Laterality: Left;  . I&d extremity Left 11/02/2012    Procedure: IRRIGATION AND DEBRIDEMENT EXTREMITY;  Surgeon: Sheral Apley, MD;  Location: [facility] OR;  Service: Orthopedics;  Laterality:  Left;    There were no vitals filed for this visit.  Visit Diagnosis:  Hereditary and idiopathic peripheral neuropathy - Plan: PT plan of care cert/re-cert  Abnormality of gait - Plan: PT plan of care cert/re-cert  Generalized weakness - Plan: PT plan of care cert/re-cert  Poor balance - Plan: PT plan of care cert/re-cert      Subjective Assessment - 05/15/15 0942    Subjective Per social worker, he visited neurologist and she is concerned about his balance and how he has to hold onto things to keep his balance.     Limitations Walking;House hold activities   Patient Stated Goals "not to come here."    Currently in Pain? Yes   Pain Score 9    Pain Location Arm   Pain Orientation Left   Pain Descriptors / Indicators Aching   Pain Type Chronic pain   Pain Onset More than a month ago   Pain Frequency Constant   Aggravating Factors  nothing   Pain Relieving Factors nothing, pain medication   Multiple Pain Sites Yes   Pain Score 9   Pain Location Back   Pain Orientation Lower   Pain Descriptors / Indicators Aching   Pain Radiating Towards up towards neck   Pain Onset More than a month ago   Pain Frequency Intermittent   Aggravating Factors  being up and moving   Pain Relieving Factors relaxing  [facility] PT Assessment - 05/15/15 0001    Assessment   Medical Diagnosis gait instability, peripheral neuropathy   Referring Provider Butch Penny, NP   Onset Date/Surgical Date --  starting using cane about a year ago   Precautions   Precautions Fall   Precaution Comments hx of cerebral aneurysm   Restrictions   Weight Bearing Restrictions No   Balance Screen   Has the patient fallen in the past 6 months Yes  twice a week   How many times? 3  typically falls when having seizure   Has the patient had a decrease in activity level because of a fear of falling?  Yes   Is the patient reluctant to leave their home because of a fear of falling?  Yes   Home  Environment   Living Environment Private residence   Living Arrangements Alone  lives independently with other people, but not group home   Available Help at Discharge --  lives alone, has Child psychotherapist   Type of Home Apartment   Home Access Stairs to enter   Entrance Stairs-Number of Steps 3   Entrance Stairs-Rails None   Home Layout One level   Home Equipment [place] - single point  holds onto furniture when walking at home   Prior Function   Level of Independence Needs assistance with homemaking  has an aide that comes to assist with cleaning   Vocation On disability   Cognition   Overall Cognitive Status History of cognitive impairments - at baseline   Attention Sustained   Sustained Attention Impaired   Sustained Attention Impairment Verbal basic   Awareness Impaired   Awareness Impairment Intellectual impairment   Problem Solving Impaired   Problem Solving Impairment Verbal basic;Functional basic   Sensation   Light Touch Impaired Detail   Light Touch Impaired Details Impaired RLE;Impaired LLE  reports numbness in feet, L>R   Hot/Cold Appears Intact   Coordination   Gross Motor Movements are Fluid and Coordinated Yes   Fine Motor Movements are Fluid and Coordinated Yes   ROM / Strength   AROM / PROM / Strength Strength   Strength   Overall Strength Deficits   Overall Strength Comments LLE hip flex 3+/5, knee ext 3+/5, knee flex 3+/5, ankle DF 3+/5, PF 4/5;  RLE hip flex 4/5, knee ext 4/5, knee flex 3+/5, ankle DF 4/5, PF 4/5  testing difficult due to difficulty following commands   Transfers   Transfers Sit to Stand;Stand to Sit   Sit to Stand 5: Supervision   Sit to Stand Details Verbal cues for sequencing;Verbal cues for technique;Verbal cues for precautions/safety   Five time sit to stand comments  30.00 seconds   Stand to Sit 5: Supervision   Stand to Sit Details (indicate cue type and reason) Verbal cues for sequencing;Verbal cues for technique;Verbal cues for  precautions/safety   Ambulation/Gait   Ambulation/Gait Yes   Ambulation/Gait Assistance 4: Min guard;4: Min assist   Ambulation/Gait Assistance Details Pt with very fast gait speed and note marked difficulty with stability when making turns or having to change speeds suddenly.    Ambulation Distance (Feet) 115 Feet   Assistive device None   Gait Pattern Step-through pattern;Trunk flexed;Lateral hip instability;Wide base of support   Ambulation Surface Level;Indoor   Gait velocity 3.71 ft/sec, however requires min A to prevent LOB  PT Education - 05/15/15 1304    Education provided Yes   Education Details Education on evaluation findings, POC, medicaid visit limitations, pt compliance   Person(s) Educated Patient;Other (comment)  social worker   Methods Explanation   Comprehension Verbalized understanding             PT Long Term Goals - 05/15/15 1411    PT LONG TERM GOAL #1   Title Pt will be independent with HEP in order to indicate decreased fall risk and improved functional mobility.  (Target Date: by third visit or 07/14/15)   Baseline dependent   PT LONG TERM GOAL #2   Title Pt will improve 5 Time sit to stand time to <20 secs in order to indicate decreased fall risk and improved functional strength.    Baseline 30 seconds on 05/15/15   PT LONG TERM GOAL #3   Title Will assess DGI and improve score by 3 points in order to indicate improved functional balance and decreased fall risk.     Baseline Did not assess due to time constraints   PT LONG TERM GOAL #4   Title Pt will verbalize understanding of fall prevention strategies in order to decrease fall risk inside and outside the home.                 Plan - 05/15/15 1307    Clinical Impression Statement Pt presents with gait instability and decreased balance with history of other specified idiopathic peripheral neuropathy (ICD 10-G60.9).  Note that during PT evaluation,  he has premorbid cognitive deficits (unsure as Child psychotherapist was unable to speak on this matter), decreased sensation in B feet, decreased BLE strength, and decreased balance.   Gait speed was 3.71 ft/sec, however requires min A to prevent overt LOB, 5 Time sit to stand time of 30.00 secs, indicative of fall risk and decreased functional strength.   Based on findings, feel that pt would benefit from skilled OP neuro PT, however he is limited due to MCD coverage.  Based on clinical presentation, pt is evolving and of moderate complexity.      Pt will benefit from skilled therapeutic intervention in order to improve on the following deficits Decreased activity tolerance;Decreased balance;Decreased cognition;Decreased coordination;Decreased endurance;Decreased knowledge of precautions;Decreased knowledge of use of DME;Decreased mobility;Decreased safety awareness;Decreased strength   Rehab Potential Fair   Clinical Impairments Affecting Rehab Potential medicaid visit limitations, compliance   PT Frequency Other (comment)  3 visits total over 60 days   PT Duration Other (comment)  3 visits total over 60 days   PT Treatment/Interventions ADLs/Self Care Home Management;DME Instruction;Gait training;Stair training;Functional mobility training;Therapeutic activities;Therapeutic exercise;Balance training;Neuromuscular re-education;Cognitive remediation;Patient/family education;Energy conservation   PT Next Visit Plan assess gait with use of RW (PT to request order), provide pt with extensive HEP for BLE strengthening and balance.  Assess BERG or DGI (maybe DGI for some awareness/planned failure)   Recommended Other Services PT to send order to MD for RW request.    Consulted and Agree with Plan of Care Patient;Other (Comment)  social worker         Problem List Patient Active Problem List   Diagnosis Date Noted  . Smoking 01/05/2015  . Chronic pain syndrome 01/05/2015  . Elbow laceration   .  Bacteremia   . Fall   . MRSA bacteremia   . Paranoia (HCC)   . Dementia with behavioral disturbance   . Seizure (HCC) 10/31/2014  . Staphylococcus aureus bacteremia without sepsis 10/18/2014  .  Altered mental status   . Nonunion fracture of left ulna   . Left elbow pain 10/13/2014  . Ulnar fracture 10/13/2014  . Encephalopathy, hepatic (HCC) 10/09/2014  . Tobacco dependency 10/08/2014  . History of rib fracture 10/08/2014  . 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 Seizures (HCC) 01/24/2014  . Bilateral leg weakness 01/23/2014  . 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/23/2014  . Hyponatremia 11/01/2012  . Convulsions/seizures (HCC) 10/31/2012  . Essential hypertension, benign 10/31/2012  . Septic joint of left elbow (HCC) 10/26/2012  . History of alcohol abuse 10/26/2012  . Chronic liver disease 10/26/2012  . Thrombocytopenia (HCC) 10/26/2012  . Chronic hepatitis C without hepatic coma (HCC) 10/26/2012    Harriet Butte, PT, MPT [facility] [address] [place], [place], [address] Phone: [phone]   Fax:  [phone] 05/15/2015, 2:17 PM  Name: [name] [name] MRN: [id number] Date of Birth: [birth year]

## 2015-05-18 NOTE — Addendum Note (Signed)
Addended by: Enedina Finner on: 05/18/2015 03:39 PM   Modules accepted: Orders

## 2015-05-18 NOTE — Telephone Encounter (Signed)
Faxed to [facility] [phone].

## 2015-05-18 NOTE — Telephone Encounter (Signed)
Ordered placed and signed. Given to RN- Alverda Skeans to fax.

## 2015-05-19 ENCOUNTER — Other Ambulatory Visit: Payer: Self-pay | Admitting: Adult Health

## 2015-05-19 NOTE — Telephone Encounter (Signed)
I got a refill request on keppra for this pt.  Last one was 04/01/2015 for 120 tabs and no refill.  Did you want to keep pt on this? He is weaning tegretol and going up on depakote.  ? About compliance issues.

## 2015-05-27 ENCOUNTER — Ambulatory Visit: Payer: Medicaid Other | Attending: Adult Health | Admitting: Physical Therapy

## 2015-05-27 DIAGNOSIS — R269 Unspecified abnormalities of gait and mobility: Secondary | ICD-10-CM

## 2015-05-27 DIAGNOSIS — G609 Hereditary and idiopathic neuropathy, unspecified: Secondary | ICD-10-CM

## 2015-05-27 NOTE — Therapy (Signed)
[facility] [address] [place], [place], [address] Phone: [phone]   Fax:  [phone]  Physical Therapy Treatment  Patient Details  Name: [name] AlexanderFennell Voges MRN: [id number] Date of Birth: 1961/02/27 Referring Provider: Butch PennyMegan Millikan, NP  Encounter Date: 05/27/2015      PT End of Session - 05/27/15 2201    Visit Number 2   Number of Visits 4   Date for PT Re-Evaluation 07/14/15   Authorization Type MCD   Authorization Time Period CCME approval from 3/6 - 08/24/15   PT Start Time 0936   PT Stop Time 1015   PT Time Calculation (min) 39 min   Equipment Utilized During Treatment Gait belt   Activity Tolerance Patient tolerated treatment well   Behavior [facility] Therapy [facility] for tasks assessed/performed      Past Medical History  Diagnosis Date  . Seizure (HCC)   . ETOHism (HCC)   . Aneurysm (HCC)   . Hypertension   . Hepatitis C   . Hereditary and idiopathic peripheral neuropathy 06/03/2014    Past Surgical History  Procedure Laterality Date  . Cerebral aneurysm repair      [facility]  . I&d extremity Left 10/26/2012    Procedure: IRRIGATION AND DEBRIDEMENT Left Elbow;  Surgeon: Sheral Apleyimothy D Murphy, MD;  [facility];  Service: Orthopedics;  Laterality: Left;  . I&d extremity Left 10/29/2012    Procedure: IRRIGATION AND DEBRIDEMENT EXTREMITY, wound vac change, stimulan beads;  Surgeon: Sheral Apleyimothy D Murphy, MD;  Location: [facility] OR;  Service: Orthopedics;  Laterality: Left;  lateral on bean bag  . Hardware removal Left 10/29/2012    Procedure: HARDWARE REMOVAL;  Surgeon: Sheral Apleyimothy D Murphy, MD;  [facility];  Service: Orthopedics;  Laterality: Left;  . Incision and drainage abscess Left 11/02/2012    Procedure: INCISION AND DRAINAGE ABSCESS;  Surgeon: Sheral Apleyimothy D Murphy, MD;  Location: [facility] OR;  Service: Orthopedics;  Laterality: Left;  . I&d extremity Left 11/02/2012    Procedure: IRRIGATION AND DEBRIDEMENT EXTREMITY;   Surgeon: Sheral Apleyimothy D Murphy, MD;  Location: [facility] OR;  Service: Orthopedics;  Laterality: Left;    There were no vitals filed for this visit.  Visit Diagnosis:  Abnormality of gait  Hereditary and idiopathic peripheral neuropathy      Subjective Assessment - 05/27/15 0940    Subjective Pt initially requesting pain medication, stating, "I have so many doctors, I get confused. Which one are you?"  Pt agreeable to receiving personal rolling walker today. Requesting that PT adjust RW so pt's posture not upright due to pain in back.   Limitations Walking;House hold activities   Patient Stated Goals "not to come here."    Currently in Pain? Yes   Pain Score 10-Worst pain ever   Pain Location Neck   Pain Orientation Left   Pain Descriptors / Indicators Shooting   Pain Type Chronic pain   Pain Radiating Towards into thoracic spine   Pain Onset More than a month ago   Pain Frequency Constant   Aggravating Factors  lying on back   Pain Relieving Factors hot water   Multiple Pain Sites No                         [facility] Adult PT Treatment/Exercise - 05/27/15 0001    Transfers   Transfers Sit to Stand;Stand to Sit   Sit to Stand 5: Supervision   Sit to Stand Details (indicate cue  type and reason) cueing for safe hand placement on RW   Stand to Sit 5: Supervision   Stand to Sit Details cueing for safe use of RW with ineffective within-session carryover   Ambulation/Gait   Ambulation/Gait Yes   Ambulation/Gait Assistance 5: Supervision;4: Min guard;4: Min assist   Ambulation/Gait Assistance Details Min guard-min A for dynamic gait activities (see DGI for details). (S) for gait with RW over level, indoor surfaces, (S) to min guard for gait over unlevel, paved surfaces with RW.   Ambulation Distance (Feet) 375 Feet   Assistive device Rolling walker;None   Gait Pattern Step-through pattern;Trunk flexed;Lateral hip instability;Wide base of support;Decreased stride length    Ambulation Surface Level;Unlevel;Indoor;Outdoor;Paved   Gait Comments Provided verbal instruction, demonstration, and verbal reinforcement of safe/proper use of RW with turning, negotiate of curb step, and ramp negotiation. Noted effective carryover of turning and curb steps; inconsistent carryover of ramp negotiation.   Standardized Balance Assessment   Standardized Balance Assessment Dynamic Gait Index   Dynamic Gait Index   Level Surface Moderate Impairment   Change in Gait Speed Mild Impairment   Gait with Horizontal Head Turns Moderate Impairment   Gait with Vertical Head Turns Moderate Impairment   Gait and Pivot Turn Moderate Impairment   Step Over Obstacle Mild Impairment   Step Around Obstacles Mild Impairment   Steps Mild Impairment   Total Score 12                PT Education - 05/27/15 2155    Education provided Yes   Education Details DGI findings and implicated fall risk.  Safe use of rolling walker with transfers; technique for curb step and ramp negotiation, lateral stepping with RW.   Person(s) Educated Patient   Methods Explanation;Demonstration;Tactile cues;Verbal cues   Comprehension Verbalized understanding;Need further instruction             PT Long Term Goals - 05/27/15 1312    PT LONG TERM GOAL #1   Title Pt will be independent with HEP in order to indicate decreased fall risk and improved functional mobility.  (Target Date: by third visit or 07/14/15)   Baseline dependent   Status On-going   PT LONG TERM GOAL #2   Title Pt will improve 5 Time sit to stand time to <20 secs in order to indicate decreased fall risk and improved functional strength.    Baseline 30 seconds on 05/15/15   Status On-going   PT LONG TERM GOAL #3   Title Will assess DGI and improve score by 3 points in order to indicate improved functional balance and decreased fall risk.     Baseline 3/8:  Initial DGI = 12/24.   Status On-going   PT LONG TERM GOAL #4   Title Pt will  verbalize understanding of fall prevention strategies in order to decrease fall risk inside and outside the home.     Status On-going               Plan - 05/27/15 2202    Clinical Impression Statement Session focused on assessing dynamic gait stability, providing RW, and educating pt on safe use of RW. DGI score of 12/24 indicated significant risk of falling. MD order for RW received and pt agreeable to obtaining personal RW. Therefore, provided RW from facility supply closet,fitting walker (slightly low due to increased back pain with upright posture), and added tennis balls to increase safety traversing surface changes. Educated pt on safe/proper use of RW with  transfers, gait (including turning, lateral stepping), and curb step/ramp negotiation. Pt demonstrated within-session carryover of ~50% of education on RW. Gait significantly more stable with RW as compared with no AD.    Pt will benefit from skilled therapeutic intervention in order to improve on the following deficits Decreased activity tolerance;Decreased balance;Decreased cognition;Decreased coordination;Decreased endurance;Decreased knowledge of precautions;Decreased knowledge of use of DME;Decreased mobility;Decreased safety awareness;Decreased strength   Rehab Potential Fair   Clinical Impairments Affecting Rehab Potential medicaid visit limitations, compliance   PT Frequency Other (comment)  3 visits over 60 days   PT Duration Other (comment)  3 visits over 60 days   PT Treatment/Interventions ADLs/Self Care Home Management;DME Instruction;Gait training;Stair training;Functional mobility training;Therapeutic activities;Therapeutic exercise;Balance training;Neuromuscular re-education;Cognitive remediation;Patient/family education;Energy conservation   PT Next Visit Plan Intiate HEP and education on fall prevention strategies in home.   Consulted and Agree with Plan of Care Patient        Problem List Patient Active  Problem List   Diagnosis Date Noted  . Smoking 01/05/2015  . Chronic pain syndrome 01/05/2015  . Elbow laceration   . Bacteremia   . Fall   . MRSA bacteremia   . Paranoia (HCC)   . Dementia with behavioral disturbance   . Seizure (HCC) 10/31/2014  . Staphylococcus aureus bacteremia without sepsis 10/18/2014  . Altered mental status   . Nonunion fracture of left ulna   . Left elbow pain 10/13/2014  . Ulnar fracture 10/13/2014  . Encephalopathy, hepatic (HCC) 10/09/2014  . Tobacco dependency 10/08/2014  . History of rib fracture 10/08/2014  . 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 Seizures (HCC) 01/24/2014  . Bilateral leg weakness 01/23/2014  . 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/23/2014  . Hyponatremia 11/01/2012  . Convulsions/seizures (HCC) 10/31/2012  . Essential hypertension, benign 10/31/2012  . Septic joint of left elbow (HCC) 10/26/2012  . History of alcohol abuse 10/26/2012  . Chronic liver disease 10/26/2012  . Thrombocytopenia (HCC) 10/26/2012  . Chronic hepatitis C without hepatic coma (HCC) 10/26/2012    Jorje Guild, PT, DPT [facility] [address] [place], [place], [address] Phone: [phone]   Fax:  [phone] 05/27/2015, 10:07 PM  Name: Maureen Duesing MRN: [id number] Date of Birth: [birth year]

## 2015-06-10 ENCOUNTER — Ambulatory Visit: Payer: Medicaid Other | Admitting: Physical Therapy

## 2015-06-24 ENCOUNTER — Ambulatory Visit: Payer: Self-pay | Admitting: Physical Therapy

## 2015-07-08 ENCOUNTER — Ambulatory Visit: Payer: Medicaid Other | Attending: Adult Health | Admitting: Physical Therapy

## 2015-07-14 ENCOUNTER — Other Ambulatory Visit: Payer: Self-pay | Admitting: Adult Health

## 2015-07-23 ENCOUNTER — Ambulatory Visit: Payer: Medicaid Other | Admitting: Adult Health

## 2015-07-27 ENCOUNTER — Encounter: Payer: Self-pay | Admitting: Adult Health

## 2015-09-07 ENCOUNTER — Other Ambulatory Visit: Payer: Self-pay | Admitting: Adult Health

## 2015-09-11 ENCOUNTER — Other Ambulatory Visit: Payer: Self-pay | Admitting: Pharmacist

## 2015-09-11 ENCOUNTER — Other Ambulatory Visit: Payer: Self-pay | Admitting: Adult Health

## 2015-09-11 DIAGNOSIS — I10 Essential (primary) hypertension: Secondary | ICD-10-CM

## 2015-09-11 MED ORDER — METOPROLOL TARTRATE 50 MG PO TABS: 25.0000 mg | ORAL_TABLET | Freq: Two times a day (BID) | ORAL | Status: DC

## 2015-09-14 ENCOUNTER — Other Ambulatory Visit: Payer: Self-pay

## 2015-09-15 ENCOUNTER — Other Ambulatory Visit: Payer: Self-pay | Admitting: *Deleted

## 2015-09-15 MED ORDER — DIVALPROEX SODIUM 500 MG PO DR TAB: DELAYED_RELEASE_TABLET | ORAL | Status: DC

## 2015-10-19 ENCOUNTER — Ambulatory Visit: Payer: Medicaid Other | Attending: Internal Medicine | Admitting: Physician Assistant

## 2015-10-19 ENCOUNTER — Other Ambulatory Visit: Payer: Self-pay | Admitting: Physician Assistant

## 2015-10-19 VITALS — BP 144/86 | HR 65 | Temp 98.1°F | Resp 16 | Ht 78.0 in | Wt 205.4 lb

## 2015-10-19 DIAGNOSIS — D696 Thrombocytopenia, unspecified: Secondary | ICD-10-CM | POA: Diagnosis not present

## 2015-10-19 DIAGNOSIS — G40909 Epilepsy, unspecified, not intractable, without status epilepticus: Secondary | ICD-10-CM | POA: Insufficient documentation

## 2015-10-19 DIAGNOSIS — R569 Unspecified convulsions: Secondary | ICD-10-CM

## 2015-10-19 DIAGNOSIS — Z79899 Other long term (current) drug therapy: Secondary | ICD-10-CM | POA: Insufficient documentation

## 2015-10-19 DIAGNOSIS — D709 Neutropenia, unspecified: Secondary | ICD-10-CM | POA: Diagnosis not present

## 2015-10-19 DIAGNOSIS — E559 Vitamin D deficiency, unspecified: Secondary | ICD-10-CM | POA: Insufficient documentation

## 2015-10-19 DIAGNOSIS — I10 Essential (primary) hypertension: Secondary | ICD-10-CM | POA: Insufficient documentation

## 2015-10-19 DIAGNOSIS — B182 Chronic viral hepatitis C: Secondary | ICD-10-CM | POA: Insufficient documentation

## 2015-10-19 LAB — TSH: TSH: 2.25 mIU/L (ref 0.40–4.50)

## 2015-10-19 LAB — CBC WITH DIFFERENTIAL/PLATELET
BASOS PCT: 0 %
Basophils Absolute: 0 cells/uL (ref 0–200)
Eosinophils Absolute: 30 cells/uL (ref 15–500)
Eosinophils Relative: 1 %
HEMATOCRIT: 41.1 % (ref 38.5–50.0)
HEMOGLOBIN: 14.3 g/dL (ref 13.2–17.1)
LYMPHS ABS: 1740 {cells}/uL (ref 850–3900)
LYMPHS PCT: 58 %
MCH: 36.3 pg — ABNORMAL HIGH (ref 27.0–33.0)
MCHC: 34.8 g/dL (ref 32.0–36.0)
MCV: 104.3 fL — AB (ref 80.0–100.0)
MONO ABS: 540 {cells}/uL (ref 200–950)
MPV: 11.1 fL (ref 7.5–12.5)
Monocytes Relative: 18 %
NEUTROS PCT: 23 %
Neutro Abs: 690 cells/uL — ABNORMAL LOW (ref 1500–7800)
Platelets: 67 10*3/uL — ABNORMAL LOW (ref 140–400)
RBC: 3.94 MIL/uL — AB (ref 4.20–5.80)
RDW: 14.2 % (ref 11.0–15.0)
WBC: 3 10*3/uL — AB (ref 3.8–10.8)

## 2015-10-19 LAB — CMP AND LIVER
ALBUMIN: 3.8 g/dL (ref 3.6–5.1)
ALK PHOS: 84 U/L (ref 40–115)
ALT: 41 U/L (ref 9–46)
AST: 74 U/L — AB (ref 10–35)
BILIRUBIN TOTAL: 1.3 mg/dL — AB (ref 0.2–1.2)
BUN: 9 mg/dL (ref 7–25)
Bilirubin, Direct: 0.6 mg/dL — ABNORMAL HIGH (ref <=0.2)
CALCIUM: 8.5 mg/dL — AB (ref 8.6–10.3)
CO2: 28 mmol/L (ref 20–31)
CREATININE: 0.9 mg/dL (ref 0.70–1.33)
Chloride: 99 mmol/L (ref 98–110)
Glucose, Bld: 92 mg/dL (ref 65–99)
Indirect Bilirubin: 0.7 mg/dL (ref 0.2–1.2)
Potassium: 4.5 mmol/L (ref 3.5–5.3)
Sodium: 135 mmol/L (ref 135–146)
Total Protein: 7.3 g/dL (ref 6.1–8.1)

## 2015-10-19 LAB — T4, FREE: FREE T4: 0.9 ng/dL (ref 0.8–1.8)

## 2015-10-19 LAB — POCT URINALYSIS DIPSTICK
Glucose, UA: NEGATIVE
KETONES UA: NEGATIVE
LEUKOCYTES UA: NEGATIVE
Nitrite, UA: NEGATIVE
Protein, UA: NEGATIVE
SPEC GRAV UA: 1.015
Urobilinogen, UA: >=8
pH, UA: 7

## 2015-10-19 LAB — T3, FREE: T3 FREE: 2.2 pg/mL — AB (ref 2.3–4.2)

## 2015-10-19 LAB — VITAMIN B12: Vitamin B-12: 970 pg/mL (ref 200–1100)

## 2015-10-19 LAB — FOLATE: Folate: 11.5 ng/mL (ref >5.4)

## 2015-10-19 NOTE — Addendum Note (Signed)
Addended byVivianne Master on: 10/19/2015 03:23 PM   Modules accepted: Orders

## 2015-10-19 NOTE — Progress Notes (Signed)
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 His biggest complaint is that he's been having more seizures lately than when he previously was prescribed a drug Tegretol. He is wishing to restart his medications. However, he has not been seen by a neurologist. He is followed by the psychotherapeutic clinic and was just sitting there last week with multiple changes to his medication regimen. See below. He also recently had labs done he was sent here for lab review.   He does not have an acute complaint for this walk-in clinic visit.  ROS:  GEN: denies fever or chills, denies change in weight Skin: denies lesions or rashes HEENT: denies headache, earache, epistaxis, sore throat, or neck pain LUNGS: denies SHOB, dyspnea, PND, orthopnea CV: denies CP or palpitations ABD: denies abd pain, N or V EXT: denies muscle spasms or swelling; no pain in lower ext, no weakness NEURO: denies numbness or tingling, denies sz, stroke or TIA   Objective:  Vitals:   10/19/15 1415  BP: (!) 144/86  Pulse: 65  Resp: 16  Temp: 98.1 F (36.7 C)  TempSrc: Oral  SpO2: 99%  Weight: 205 lb 6.4 oz (93.2 kg)  Height: 6\' 6"  (1.981 m)    Physical Exam: General: in no acute distress. HEENT: no pallor, no icterus, moist oral mucosa, no JVD, no lymphadenopathy Heart: Normal  s1 &s2  Regular rate and rhythm, without murmurs, rubs, gallops. Lungs: Clear to auscultation bilaterally. Abdomen: Soft, nontender, nondistended, positive bowel sounds. Extremities: No clubbing cyanosis or edema with positive pedal pulses. Neuro: Alert, awake, oriented x3, nonfocal. Grossly normal. Some memory deficits.   Medications: Prior to Admission medications   Medication Sig Start Date End Date Taking? Authorizing Provider   diclofenac sodium (VOLTAREN) 1 % GEL Apply 4 g topically 4 (four) times daily. To elbow. Please instruct in dosing. 02/11/15  Yes Linna Hoff, MD  divalproex (DEPAKOTE) 500 MG DR tablet 1 tablet PO in the morning, 2 tablets PO at bedtime 09/15/15  Yes York Spaniel, MD  FLUoxetine (PROZAC) 10 MG capsule Take by mouth daily. 02/24/15  Yes Historical Provider, MD  metoprolol (LOPRESSOR) 50 MG tablet Take 0.5 tablets (25 mg total) by mouth 2 (two) times daily. 09/11/15  Yes Quentin Angst, MD  QUEtiapine (SEROQUEL) 50 MG tablet Take 50 mg by mouth 2 (two) times daily. 04/11/15  Yes Historical Provider, MD  acetaminophen-codeine (TYLENOL #3) 300-30 MG tablet Take 1 tablet by mouth every 4 (four) hours as needed. Patient not taking: Reported on 10/19/2015 04/20/15   Quentin Angst, MD  carbamazepine (TEGRETOL) 200 MG tablet Take 1 tablet PO TID for 1 week, 1/2 tablet PO TID for 1 week, 1/2 Tablet PO BID for 1 week then stop medication Patient not taking: Reported on 10/19/2015 05/07/15   Quentin Angst, MD  folic acid (FOLVITE) 1 MG tablet Take 1 tablet (1 mg total) by mouth daily. Patient not taking: Reported on 10/19/2015 08/15/14   [name], MD  levETIRAcetam (KEPPRA) 750 MG tablet Take 2 tablets (1,500 mg total) by mouth in the morning and in the evening daily. Patient not taking: Reported on 10/19/2015 09/11/15   Butch Penny, NP  nicotine (NICODERM CQ - DOSED IN MG/24 HOURS) 21 mg/24hr patch Place 1 patch (21 mg total) onto the  skin daily. Patient not taking: Reported on 10/19/2015 11/05/14   Rhetta Mura, MD  pregabalin (LYRICA) 50 MG capsule Take 2 tablets in the morning, 1 tablet at noon and 2 tablets at bedtime. Patient not taking: Reported on 10/19/2015 09/03/14   York Spaniel, MD  thiamine (VITAMIN B-1) 100 MG tablet Take by mouth. 10/27/12   Historical Provider, MD    Assessment: 1. 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 Neutropenia/thrombocytopenia 3. Chr Hep C with  elevated transaminases 4. Vitamin D def 5. HTN 6. Smoker  Plan: He was seen last week by his NP at [facility]: Changes then-DC Prozac; Start Remeron (bc of dec appetite) DC Seroquel, Start Latuda  Repeat labs Neuro referral to review SZ meds and make necessary changes/recs No med changes today  Follow up:1 week with PCP  The patient was given clear instructions to go to ER or return to medical center if symptoms don't improve, worsen or new problems develop. The patient verbalized understanding. The patient was told to call to get lab results if they haven't heard anything in the next week.   This note has been created with Education officer, environmental. Any transcriptional errors are unintentional.   Scot Jun, PA-C 10/19/2015, 2:55 PM

## 2015-10-19 NOTE — Progress Notes (Signed)
Pt requests medication changes. Pt reports pain in back and head rated at a 9.

## 2015-10-20 LAB — VITAMIN D 25 HYDROXY (VIT D DEFICIENCY, FRACTURES): Vit D, 25-Hydroxy: 20 ng/mL — ABNORMAL LOW (ref 30–100)

## 2015-10-23 ENCOUNTER — Telehealth: Payer: Self-pay

## 2015-10-23 ENCOUNTER — Other Ambulatory Visit: Payer: Self-pay | Admitting: Internal Medicine

## 2015-10-23 NOTE — Telephone Encounter (Signed)
Contacted [employer] and spoke with [employer] and she stated that the code that was used for the drug screen was not good and would need to change the code. She faxed over a list of codes that can be used. Per Dr. Nathen May for all drug screens the code to use will be [id number]. Contacted [facility] and changed the code

## 2015-10-26 ENCOUNTER — Other Ambulatory Visit: Payer: Self-pay | Admitting: Physician Assistant

## 2015-10-26 MED ORDER — VITAMIN D (ERGOCALCIFEROL) 1.25 MG (50000 UNIT) PO CAPS: 50000.0000 [IU] | ORAL_CAPSULE | ORAL | 0 refills | Status: DC

## 2015-10-26 NOTE — Progress Notes (Incomplete)
Vitamin d

## 2015-10-27 LAB — DRUG ABUSE PANEL 10-50, U
AMPHETAMINES (1000 ng/mL SCRN): NEGATIVE
BARBITURATES: NEGATIVE
BENZODIAZEPINES: NEGATIVE
COCAINE METABOLITES: NEGATIVE
MARIJUANA MET (50 NG/ML SCRN): NEGATIVE
METHADONE: NEGATIVE
METHAQUALONE: NEGATIVE
OPIATES: NEGATIVE
PHENCYCLIDINE: NEGATIVE
PROPOXYPHENE: NEGATIVE

## 2015-10-30 ENCOUNTER — Telehealth: Payer: Self-pay

## 2015-10-30 NOTE — Telephone Encounter (Signed)
Telephone call to patient at [phone] phone rang greater than 5 times then call disconnected; no option for voicemail.  ATTEMPTED to advise per PA Scot Juniffany Noel: Vitamin D level is low. A script has been sent in for replacement. Vitamin D 50,000 U weekly for 12 weeks then we will readjust dose.   Pollyann KennedyKim Jaxtyn Linville, RN, BSN

## 2015-11-02 NOTE — Telephone Encounter (Signed)
RN advised patient per PA-Tiffany Danelle EarthlyNoel: his vitamin D is low. A script has been sent in for 50,000 U weekly for 12 weeks then we will readjust dose.  Patient verbalized understanding.  Pollyann KennedyKim Becton, RN, BSN

## 2015-11-02 NOTE — Telephone Encounter (Signed)
-----   Message from Vivianne Masteriffany S Noel, [place]-C sent at 10/26/2015  8:43 AM EDT ----- Regarding: call bk/new script Selena BattenKim,  Can you please let him know that his Vit D level is low. A script has been sent in for replacement. Vit D 50,000 U weekly for 12 weeks then we will readjust dose. Thanks.   TN ----- Message ----- From: Ardean LarsenMichelle B Morehead, CMA Sent: 10/19/2015   3:27 PM To: Vivianne Masteriffany S Noel, PA-C

## 2015-12-25 ENCOUNTER — Other Ambulatory Visit: Payer: Self-pay | Admitting: Pharmacist

## 2015-12-25 DIAGNOSIS — I10 Essential (primary) hypertension: Secondary | ICD-10-CM

## 2015-12-25 MED ORDER — METOPROLOL TARTRATE 25 MG PO TABS: 25.0000 mg | ORAL_TABLET | Freq: Two times a day (BID) | ORAL | 2 refills | Status: DC

## 2016-01-04 ENCOUNTER — Encounter: Payer: Self-pay | Admitting: Physical Therapy

## 2016-01-04 NOTE — Therapy (Signed)
[facility] [address] [place], [place], [address] Phone: [phone]   Fax:  [phone]  Patient Details  Name: Ren Aspinall MRN: [id number] Date of Birth: 1960/09/16 Referring Provider:  No ref. provider found  Encounter Date: 01/04/2016  PHYSICAL THERAPY DISCHARGE SUMMARY  Visits from Start of Care: 2  Current functional level related to goals / functional outcomes: Unknown, as patient did not return to PT after initial 2 sessions.     PT Long Term Goals - 05/27/15 1312      PT LONG TERM GOAL #1   Title Pt will be independent with HEP in order to indicate decreased fall risk and improved functional mobility.  (Target Date: by third visit or 07/14/15)   Baseline dependent   Status On-going     PT LONG TERM GOAL #2   Title Pt will improve 5 Time sit to stand time to <20 secs in order to indicate decreased fall risk and improved functional strength.    Baseline 30 seconds on 05/15/15   Status On-going     PT LONG TERM GOAL #3   Title Will assess DGI and improve score by 3 points in order to indicate improved functional balance and decreased fall risk.     Baseline 3/8:  Initial DGI = 12/24.   Status On-going     PT LONG TERM GOAL #4   Title Pt will verbalize understanding of fall prevention strategies in order to decrease fall risk inside and outside the home.     Status On-going        Remaining deficits: Unknown, as patient did not return to PT after initial 2 sessions.    Education / Equipment: HEP, fall prevention strategies in home environment, and safe/proper use of RW for all mobility (household and community).  Plan:                                                    Patient goals were not met. Patient is being discharged due to not returning since the last visit.  ?????        Billie Ruddy, PT, DPT [facility] [address] [place],  [place], [address] Phone: [phone]   Fax:  [phone] 01/04/16, 11:17 AM

## 2016-01-18 ENCOUNTER — Other Ambulatory Visit: Payer: Self-pay

## 2016-01-18 MED ORDER — DIVALPROEX SODIUM 500 MG PO DR TAB: DELAYED_RELEASE_TABLET | ORAL | 0 refills | Status: DC

## 2016-01-28 ENCOUNTER — Encounter: Payer: Self-pay | Admitting: Internal Medicine

## 2016-01-28 ENCOUNTER — Ambulatory Visit: Payer: Medicaid Other | Attending: Internal Medicine | Admitting: Internal Medicine

## 2016-01-28 VITALS — BP 125/73 | HR 90 | Temp 98.4°F | Resp 18 | Ht 78.0 in | Wt 205.0 lb

## 2016-01-28 DIAGNOSIS — F17200 Nicotine dependence, unspecified, uncomplicated: Secondary | ICD-10-CM | POA: Diagnosis not present

## 2016-01-28 DIAGNOSIS — M549 Dorsalgia, unspecified: Secondary | ICD-10-CM | POA: Diagnosis not present

## 2016-01-28 DIAGNOSIS — I729 Aneurysm of unspecified site: Secondary | ICD-10-CM | POA: Insufficient documentation

## 2016-01-28 DIAGNOSIS — R569 Unspecified convulsions: Secondary | ICD-10-CM | POA: Diagnosis not present

## 2016-01-28 DIAGNOSIS — G40909 Epilepsy, unspecified, not intractable, without status epilepticus: Secondary | ICD-10-CM | POA: Insufficient documentation

## 2016-01-28 DIAGNOSIS — I10 Essential (primary) hypertension: Secondary | ICD-10-CM | POA: Diagnosis not present

## 2016-01-28 DIAGNOSIS — Z886 Allergy status to analgesic agent status: Secondary | ICD-10-CM | POA: Insufficient documentation

## 2016-01-28 DIAGNOSIS — G894 Chronic pain syndrome: Secondary | ICD-10-CM

## 2016-01-28 DIAGNOSIS — B1920 Unspecified viral hepatitis C without hepatic coma: Secondary | ICD-10-CM | POA: Insufficient documentation

## 2016-01-28 NOTE — Progress Notes (Signed)
[name] [name], is a 55 y.o. male  [id number]  [id number]  DOB - Aug 09, 1960  Chief Complaint  Patient presents with  . Back Pain      Subjective:   [name] [name] is a 55 y.o. male with history of aneurysmal rupture and subsequent seizure disorder, hypertension and hepatitis C here today for a follow up visit. Patient's major concern today is that he would like to go back on Tegretol because that is the only medication that works for him. He claims that since being off Tegretol, his seizure frequency has increased to about 2-3 per week from 1 week. He has not seen Neurologist for long time. Patient has No headache, No chest pain, No abdominal pain - No Nausea, No new weakness tingling or numbness, No Cough - SOB. He has no new complaint today.   No problems updated.  ALLERGIES: Allergies  Allergen Reactions  . Carbamazepine     Hyponatremia   PAST MEDICAL HISTORY: Past Medical History:  Diagnosis Date  . Aneurysm (HCC)   . ETOHism (HCC)   . Hepatitis C   . 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 Hypertension   . Seizure [facility](HCC)    MEDICATIONS AT HOME: Prior to Admission medications   Medication Sig Start Date End Date Taking? Authorizing Provider  diclofenac sodium (VOLTAREN) 1 % GEL Apply 4 g topically 4 (four) times daily. To elbow. Please instruct in dosing. 02/11/15  Yes Linna HoffJames D Kindl, MD  divalproex (DEPAKOTE) 500 MG DR tablet 1 tablet PO in the morning, 2 tablets PO at bedtime 01/18/16  Yes York Spanielharles K [name], MD  FLUoxetine (PROZAC) 10 MG capsule Take by mouth daily. 02/24/15  Yes Historical Provider, MD  levETIRAcetam (KEPPRA) 750 MG tablet Take 2 tablets (1,500 mg total) by mouth in the morning and in the evening daily. 09/11/15  Yes Butch PennyMegan Millikan, NP  metoprolol (LOPRESSOR) 25 MG tablet Take 1 tablet (25 mg total) by mouth 2 (two) times daily. 12/25/15  Yes Quentin Angstlugbemiga E Elona Yinger, MD  QUEtiapine (SEROQUEL) 50 MG tablet Take 50 mg by mouth  2 (two) times daily. 04/11/15  Yes Historical Provider, MD  thiamine (VITAMIN B-1) 100 MG tablet Take by mouth. 10/27/12  Yes Historical Provider, MD  Vitamin D, Ergocalciferol, (DRISDOL) 50000 units CAPS capsule Take 1 capsule (50,000 Units total) by mouth every 7 (seven) days. 10/26/15  Yes Tiffany Netta CedarsS Noel, PA-C  acetaminophen-codeine (TYLENOL #3) 300-30 MG tablet Take 1 tablet by mouth every 4 (four) hours as needed. Patient not taking: Reported on 01/28/2016 04/20/15   Quentin Angstlugbemiga E Sicilia Killough, MD  carbamazepine (TEGRETOL) 200 MG tablet Take 1 tablet PO TID for 1 week, 1/2 tablet PO TID for 1 week, 1/2 Tablet PO BID for 1 week then stop medication Patient not taking: Reported on 01/28/2016 05/07/15   Quentin Angstlugbemiga E Urijah Arko, MD  folic acid (FOLVITE) 1 MG tablet Take 1 tablet (1 mg total) by mouth daily. Patient not taking: Reported on 01/28/2016 08/15/14   Doris Cheadleeepak Advani, MD  nicotine (NICODERM CQ - DOSED IN MG/24 HOURS) 21 mg/24hr patch Place 1 patch (21 mg total) onto the skin daily. Patient not taking: Reported on 01/28/2016 11/05/14   Rhetta MuraJai-Gurmukh Samtani, MD  pregabalin (LYRICA) 50 MG capsule Take 2 tablets in the morning, 1 tablet at noon and 2 tablets at bedtime. Patient not taking: Reported on 01/28/2016 09/03/14   York Spanielharles K [name], MD    Objective:   Vitals:   01/28/16 1303  BP: 125/73  Pulse:  90  Resp: 18  Temp: 98.4 F (36.9 C)  TempSrc: Oral  SpO2: 95%  Weight: 205 lb (93 kg)  Height: 6\' 6"  (1.981 m)   Exam General appearance : Awake, alert, not in any distress. Speech Clear. Not toxic looking, unkempt HEENT: Atraumatic and Normocephalic, pupils equally reactive to light and accomodation Neck: Supple, no JVD. No cervical lymphadenopathy.  Chest: Good air entry bilaterally, no added sounds  CVS: S1 S2 regular, no murmurs.  Abdomen: Bowel sounds present, Non tender and not distended with no gaurding, rigidity or rebound. Extremities: B/L Lower Ext shows no edema, both legs are warm to  touch Neurology: Awake alert, and oriented X 3, CN II-XII intact, Non focal Skin: No Rash  Data Review Lab Results  Component Value Date   HGBA1C 5.7 (H) 01/24/2014    Assessment & Plan   1. Seizures (HCC)  - Ambulatory referral to Neurology for medication management and possible Tegretol restart  2. Chronic pain syndrome  - Ambulatory referral to Neurology  3. Essential hypertension  We have discussed target BP range and blood pressure goal. I have advised patient to check BP regularly and to call us back or report to clinic if the numbers are consistently higher than 140/90. We discussed the importance of compliance with medical therapy and DASH diet recommended, consequences of uncontrolled hypertension discussed.  - continue current BP medications  4. Smoking  [name] [name] was counseled on the dangers of tobacco use, and was advised to quit. Reviewed strategies to maximize success, including removing cigarettes and smoking materials from environment, stress management and support of family/friends.  Patient have been counseled extensively about nutrition and exercise.  Return in about 6 months (around 07/27/2016) for Seizure Disorder.  The patient was given clear instructions to go to ER or return to medical center if symptoms don't improve, worsen or new problems develop. The patient verbalized understanding. The patient was told to call to get lab results if they haven't heard anything in the next week.   This note has been created with Education officer, environmentalDragon speech recognition software and smart phrase technology. Any transcriptional errors are unintentional.    Jeanann LewandowskyJEGEDE, Ollis Daudelin, MD, MHA, FACP, FAAP, CPE [facility] [place], [place] [phone]   01/28/2016, 1:11 PM

## 2016-01-28 NOTE — Patient Instructions (Signed)

## 2016-01-28 NOTE — Progress Notes (Signed)
Patient is here for FU Back Pain  Patient complains of back pain being present and scaled at a 10.  Patient had last seizure on last Friday.  Patient has not taken medication today. Patient has eaten today.  Patient declined flu vaccine.

## 2016-02-04 ENCOUNTER — Telehealth: Payer: Self-pay

## 2016-02-04 NOTE — Telephone Encounter (Signed)
Called [name] PaganKim Snipes, listed as pt's care Production designer, theatre/television/filmmanager. However, she is no longer on this pt's case. Called and left mssg on main # for [facility] w/ appt and office contact info.

## 2016-02-04 NOTE — Telephone Encounter (Signed)
Called pt to notify him of appt scheduled 02/26/16 w/ 12:00 arrival time.

## 2016-02-16 ENCOUNTER — Other Ambulatory Visit: Payer: Self-pay

## 2016-02-16 MED ORDER — DIVALPROEX SODIUM 500 MG PO DR TAB: DELAYED_RELEASE_TABLET | ORAL | 0 refills | Status: DC

## 2016-02-24 IMAGING — CR DG ELBOW COMPLETE 3+V*L*
5 series · 5 of 5 positions shown · non-contrast
Comparison: October 13, 2014.

CLINICAL DATA: Acute left elbow pain after fall at home.

EXAM:
LEFT ELBOW - COMPLETE 3+ VIEW

[t elbow ap left (1 of 3)]
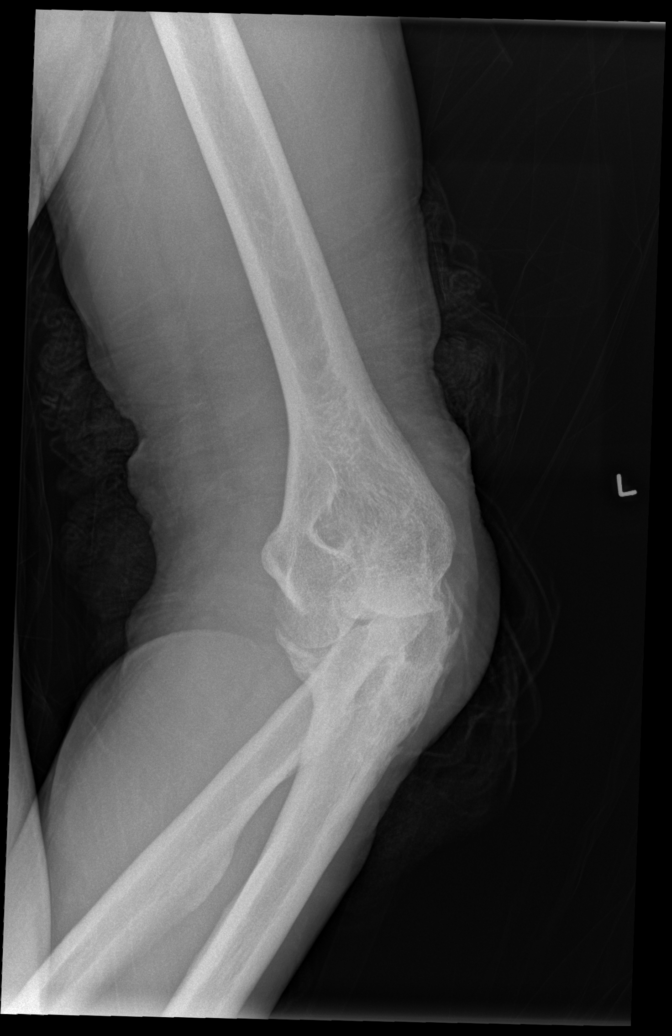

[t elbow ap left (2 of 3)]
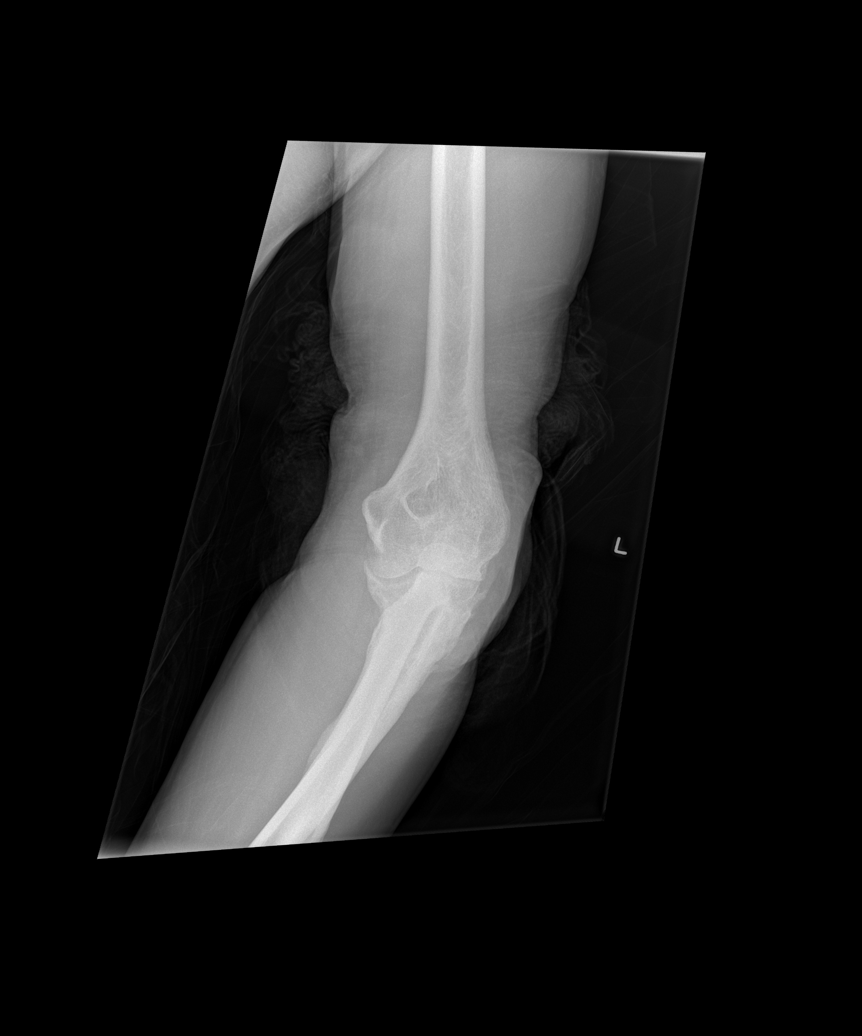

[t elbow ap left (3 of 3)]
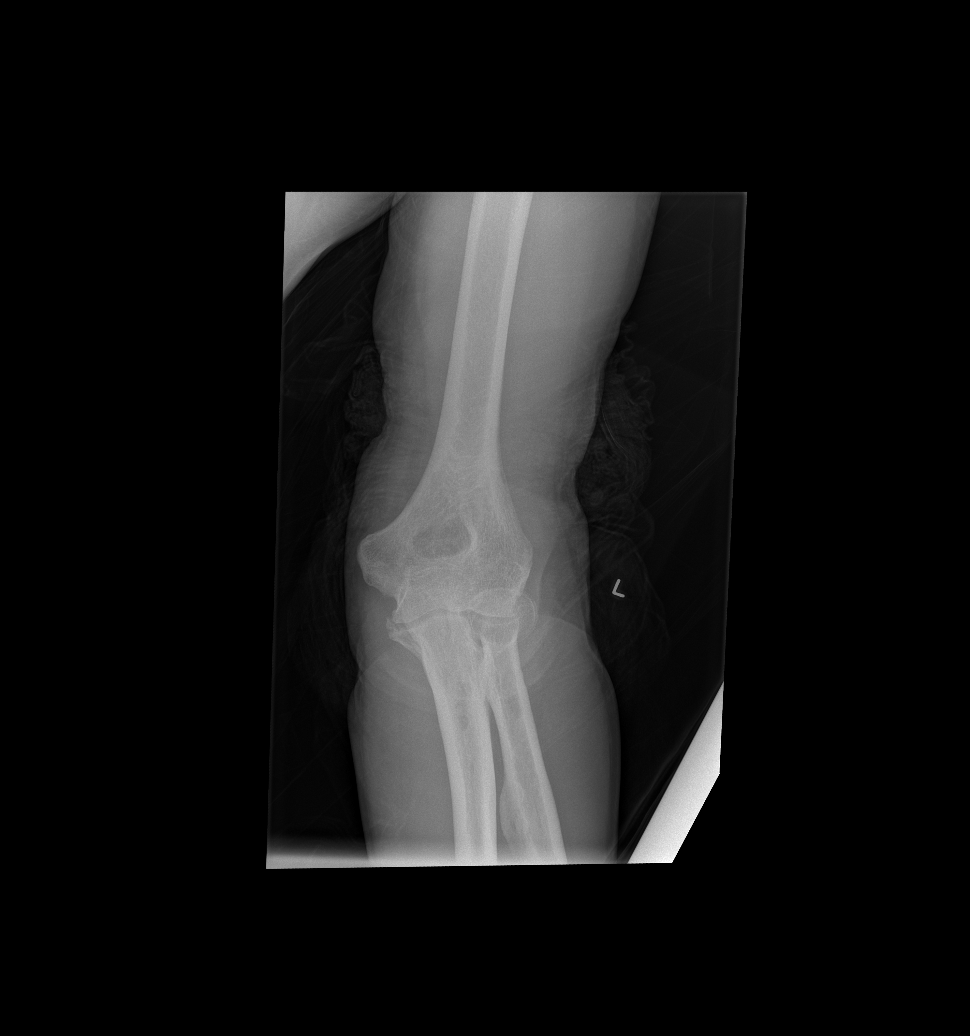

[x elbow obl left (1 of 2)]
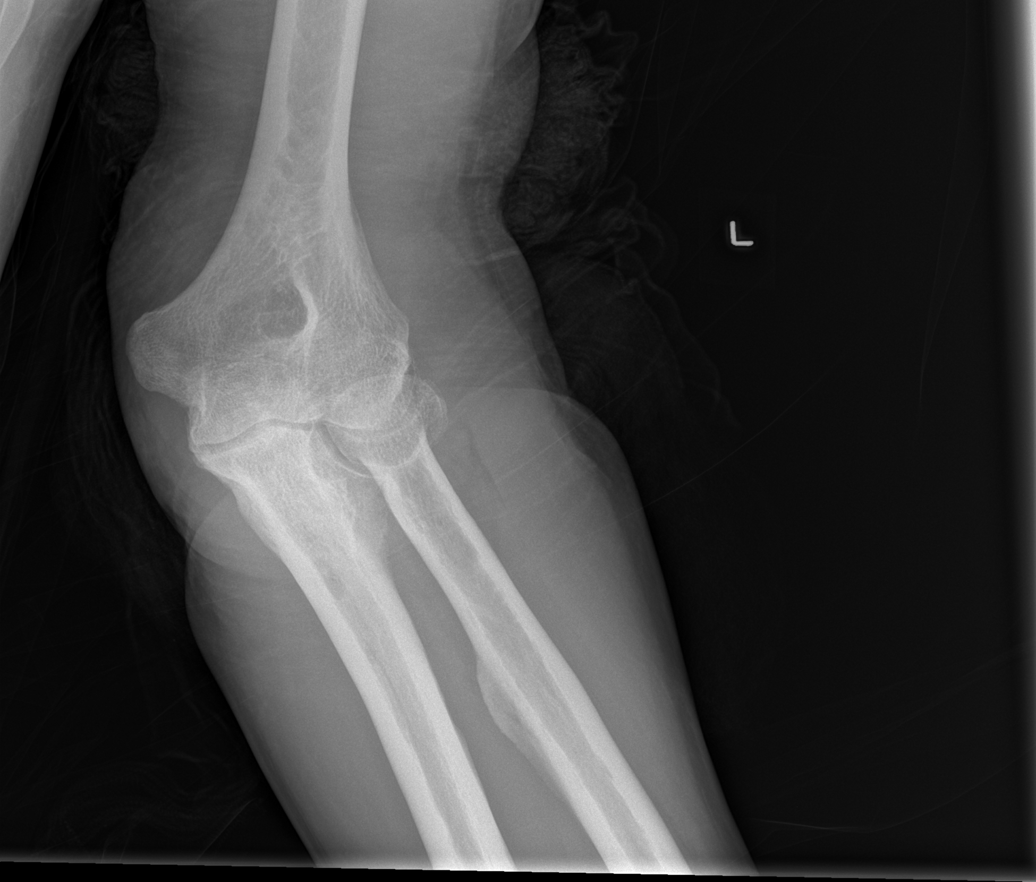

[x elbow obl left (2 of 2)]
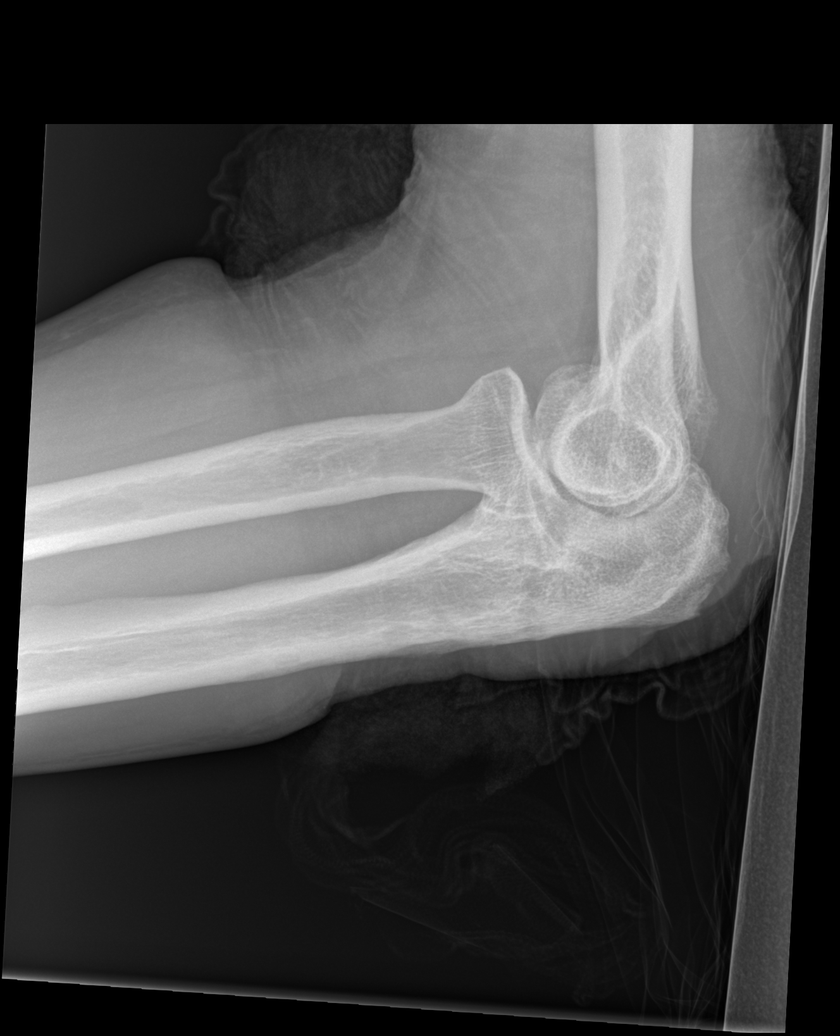

[5 of 5 positions shown; findings below may reference images not displayed]

FINDINGS: There is continued presence of nondisplaced fracture involving the
proximal ulna with intra-articular extension. No abnormal fat pad
displacement is noted. Visualized portions of radius and ulna appear
normal. Mild degenerative changes are noted.
IMPRESSION: Continued presence of proximal ulnar fracture with intra-articular
extension.

## 2016-02-26 ENCOUNTER — Telehealth: Payer: Self-pay

## 2016-02-26 ENCOUNTER — Ambulatory Visit: Payer: Self-pay | Admitting: Neurology

## 2016-02-26 NOTE — Telephone Encounter (Signed)
Pt cancelled appt due to weather.

## 2016-02-27 IMAGING — MR MR HEAD W/O CM
2 series · 48 of 48 positions shown · non-contrast
Comparison: Head CT 10/31/2014

CLINICAL DATA: Chronic seizure disorder. Recent hospitalization
after fall [facility]. Acute metabolic encephalopathy.

EXAM:
MRI HEAD WITHOUT CONTRAST
TECHNIQUE: Multiplanar, multiecho pulse sequences of the brain and surrounding
structures were obtained without intravenous contrast.

[Series 400: DWI · axial · 3.0mm · 1.09mm/px · z∈[-70,+69]mm · 28 of 48 slices shown (1 of 2)]
[im 1/48]
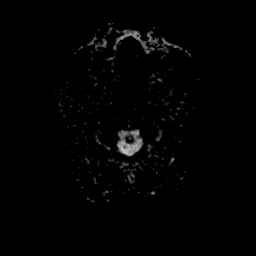
[im 2/48]
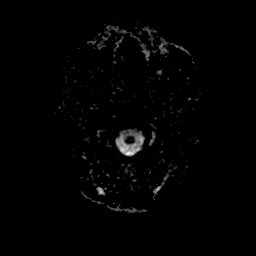
[im 4/48]
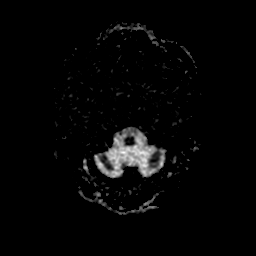
[im 6/48]
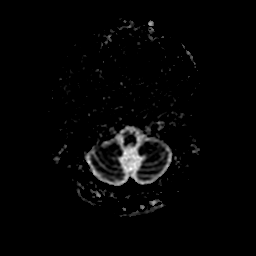
[im 7/48]
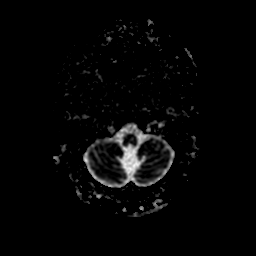
[im 9/48]
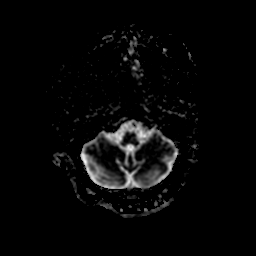
[im 11/48]
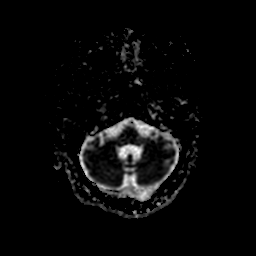
[im 13/48]
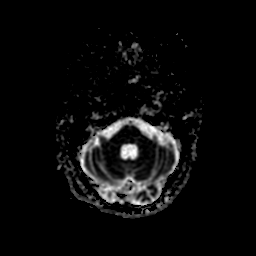
[im 14/48]
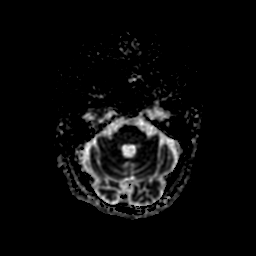
[im 16/48]
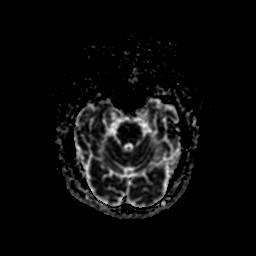
[im 18/48]
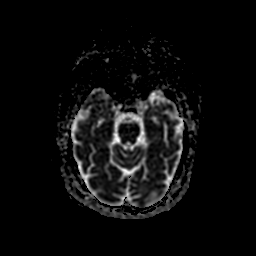
[im 20/48]
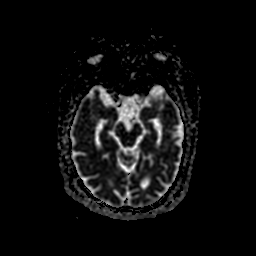
[im 21/48]
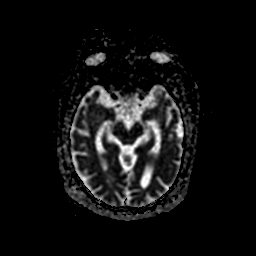
[im 23/48]
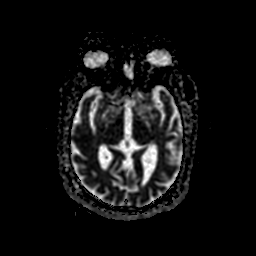
[im 25/48]
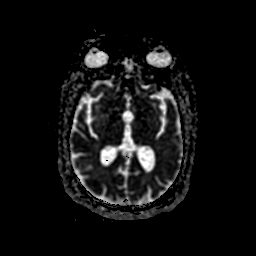
[im 27/48]
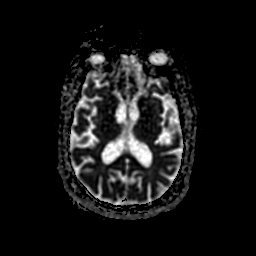
[im 28/48]
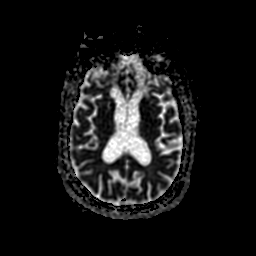
[im 30/48]
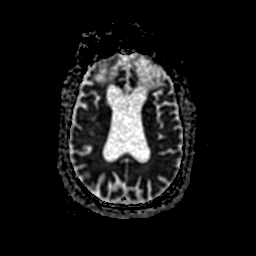
[im 32/48]
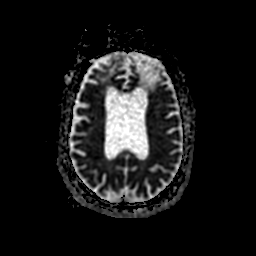
[im 34/48]
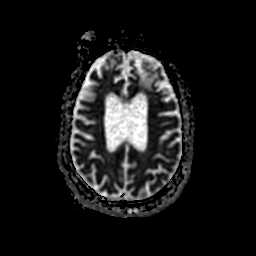
[im 35/48]
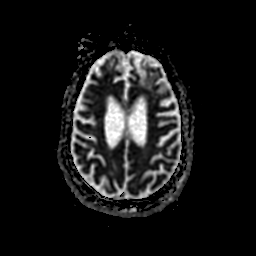
[im 37/48]
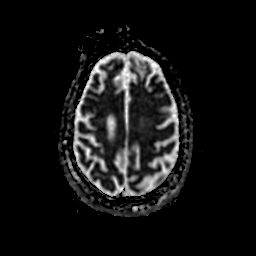
[im 39/48]
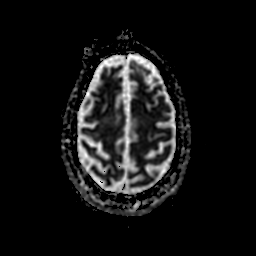
[im 41/48]
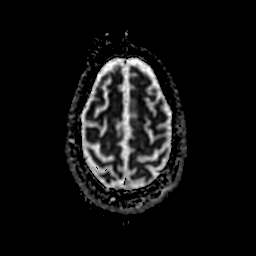
[im 42/48]
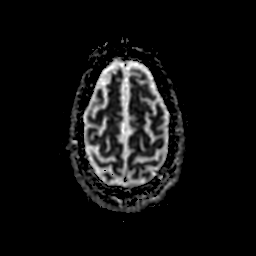
[im 44/48]
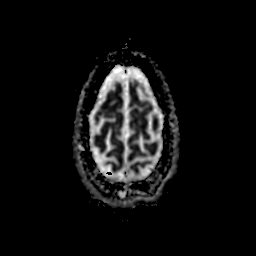
[im 46/48]
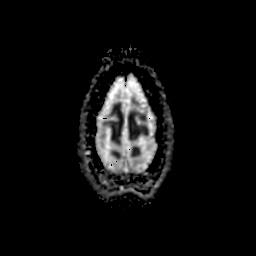
[im 48/48]
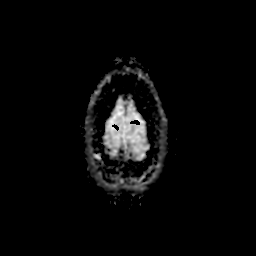

[Series 500: DWI · coronal · 5.0mm · 1.09mm/px · 20 of 33 slices shown (2 of 2)]
[im 1/33]
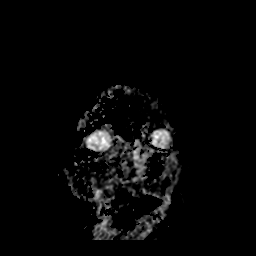
[im 2/33]
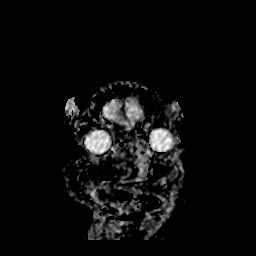
[im 4/33]
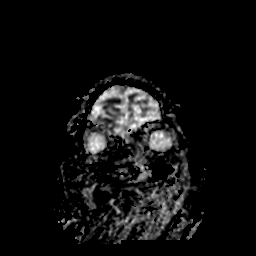
[im 6/33]
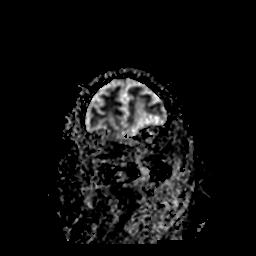
[im 7/33]
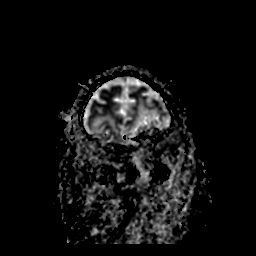
[im 9/33]
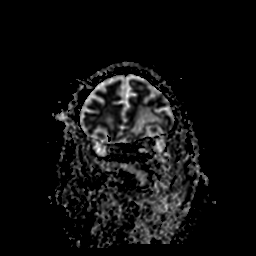
[im 11/33]
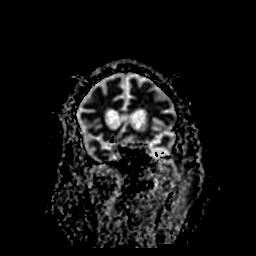
[im 12/33]
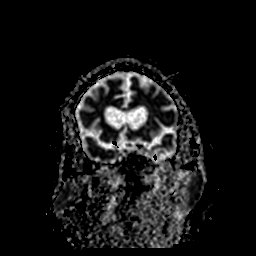
[im 14/33]
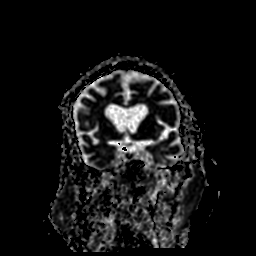
[im 16/33]
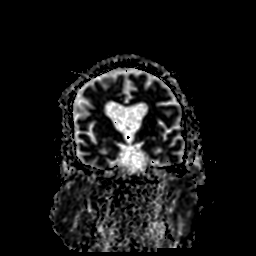
[im 17/33]
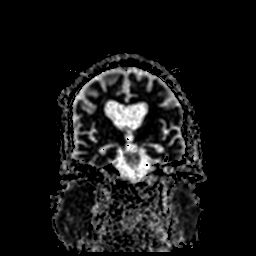
[im 19/33]
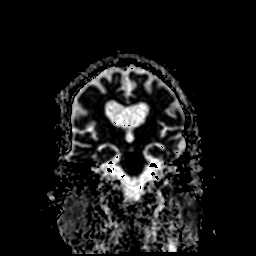
[im 21/33]
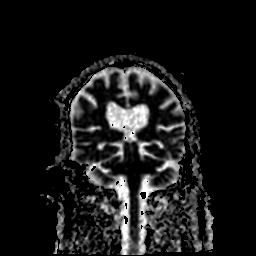
[im 22/33]
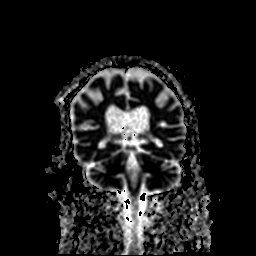
[im 24/33]
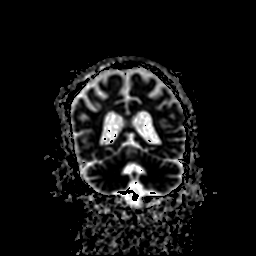
[im 26/33]
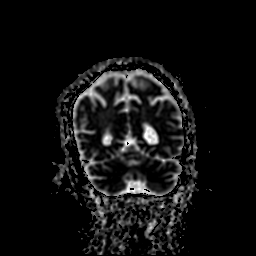
[im 27/33]
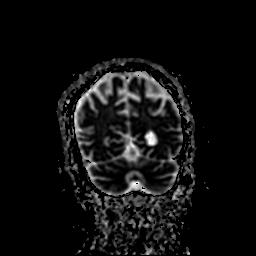
[im 29/33]
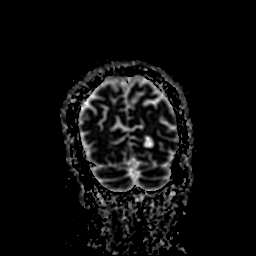
[im 31/33]
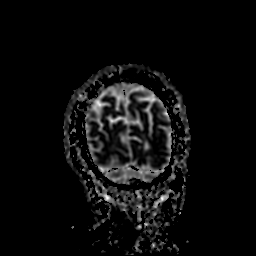
[im 33/33]
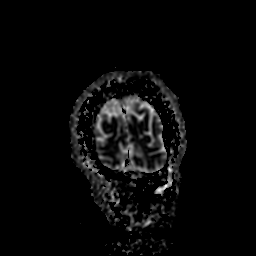

[48 of 48 positions shown; findings below may reference images not displayed]

FINDINGS: The study suffers from some motion degradation but is sufficiently
diagnostic. Diffusion imaging does not show any acute or subacute
infarction. The brain shows generalized atrophy. No focal
abnormality affects the brainstem or cerebellum. Within the cerebral
hemispheres, there is bifrontal atrophy and gliosis, most commonly
seen as a sequela of previous head trauma. Lesser changes also
affect the temporal tips (left more than right), also typical of
previous trauma. No evidence of ischemic infarctions. No mass
lesion, acute hemorrhage, hydrocephalus or extra-axial collection.
There are small foci of residual hemosiderin deposition in the
frontal and temporal regions probably related to previous head
trauma. No mesial temporal lesion or asymmetry. No pituitary mass.
No inflammatory sinus disease. No skull or skullbase lesion.
IMPRESSION: No acute or reversible finding. Generalized brain atrophy. Bifrontal
atrophy and gliosis presumably related to distant head trauma.
Lesser changes of atrophy and gliosis affect the temporal tips as
well.

## 2016-03-01 ENCOUNTER — Encounter: Payer: Self-pay | Admitting: Neurology

## 2016-03-17 ENCOUNTER — Other Ambulatory Visit: Payer: Self-pay

## 2016-03-17 MED ORDER — DIVALPROEX SODIUM 500 MG PO DR TAB: DELAYED_RELEASE_TABLET | ORAL | 0 refills | Status: DC

## 2016-04-06 ENCOUNTER — Ambulatory Visit: Payer: Medicaid Other | Admitting: Neurology

## 2016-04-14 ENCOUNTER — Other Ambulatory Visit: Payer: Self-pay | Admitting: Adult Health

## 2016-04-14 ENCOUNTER — Other Ambulatory Visit: Payer: Self-pay | Admitting: Neurology

## 2016-04-18 ENCOUNTER — Other Ambulatory Visit: Payer: Self-pay | Admitting: Internal Medicine

## 2016-04-18 DIAGNOSIS — I10 Essential (primary) hypertension: Secondary | ICD-10-CM

## 2016-04-27 ENCOUNTER — Ambulatory Visit: Payer: Medicaid Other | Attending: Internal Medicine | Admitting: Internal Medicine

## 2016-04-27 ENCOUNTER — Encounter: Payer: Self-pay | Admitting: Internal Medicine

## 2016-04-27 VITALS — BP 129/78 | HR 91 | Temp 98.1°F | Resp 18 | Ht 76.0 in | Wt 212.8 lb

## 2016-04-27 DIAGNOSIS — B1920 Unspecified viral hepatitis C without hepatic coma: Secondary | ICD-10-CM | POA: Diagnosis not present

## 2016-04-27 DIAGNOSIS — I10 Essential (primary) hypertension: Secondary | ICD-10-CM | POA: Insufficient documentation

## 2016-04-27 DIAGNOSIS — G894 Chronic pain syndrome: Secondary | ICD-10-CM | POA: Insufficient documentation

## 2016-04-27 DIAGNOSIS — Z888 Allergy status to other drugs, medicaments and biological substances status: Secondary | ICD-10-CM | POA: Insufficient documentation

## 2016-04-27 DIAGNOSIS — R569 Unspecified convulsions: Secondary | ICD-10-CM | POA: Diagnosis not present

## 2016-04-27 LAB — COMPLETE METABOLIC PANEL WITH GFR
ALT: 16 U/L (ref 9–46)
AST: 42 U/L — ABNORMAL HIGH (ref 10–35)
Albumin: 3.9 g/dL (ref 3.6–5.1)
Alkaline Phosphatase: 60 U/L (ref 40–115)
BUN: 6 mg/dL — ABNORMAL LOW (ref 7–25)
CO2: 25 mmol/L (ref 20–31)
Calcium: 9.1 mg/dL (ref 8.6–10.3)
Chloride: 95 mmol/L — ABNORMAL LOW (ref 98–110)
Creat: 0.9 mg/dL (ref 0.70–1.33)
GLUCOSE: 125 mg/dL — AB (ref 65–99)
Potassium: 3.8 mmol/L (ref 3.5–5.3)
Sodium: 131 mmol/L — ABNORMAL LOW (ref 135–146)
Total Bilirubin: 1.2 mg/dL (ref 0.2–1.2)
Total Protein: 7.8 g/dL (ref 6.1–8.1)

## 2016-04-27 NOTE — Progress Notes (Signed)
Patient is here for Weakness  Patient denies pain at this time.  Patient has taken medication today. Patient has eaten today.  Patient is insistent on taking tegretol though patient is aware of tegretol being an allergen.

## 2016-04-27 NOTE — Patient Instructions (Signed)
Hypertension Hypertension, commonly called high blood pressure, is when the force of blood pumping through your arteries is too strong. Your arteries are the blood vessels that carry blood from your heart throughout your body. A blood pressure reading consists of a higher number over a lower number, such as 110/72. The higher number (systolic) is the pressure inside your arteries when your heart pumps. The lower number (diastolic) is the pressure inside your arteries when your heart relaxes. Ideally you want your blood pressure below 120/80. Hypertension forces your heart to work harder to pump blood. Your arteries may become narrow or stiff. Having untreated or uncontrolled hypertension can cause heart attack, stroke, kidney disease, and other problems. What increases the risk? Some risk factors for high blood pressure are controllable. Others are not. Risk factors you cannot control include:  Race. You may be at higher risk if you are African American.  Age. Risk increases with age.  Gender. Men are at higher risk than women before age 45 years. After age 65, women are at higher risk than men. Risk factors you can control include:  Not getting enough exercise or physical activity.  Being overweight.  Getting too much fat, sugar, calories, or salt in your diet.  Drinking too much alcohol. What are the signs or symptoms? Hypertension does not usually cause signs or symptoms. Extremely high blood pressure (hypertensive crisis) may cause headache, anxiety, shortness of breath, and nosebleed. How is this diagnosed? To check if you have hypertension, your health care provider will measure your blood pressure while you are seated, with your arm held at the level of your heart. It should be measured at least twice using the same arm. Certain conditions can cause a difference in blood pressure between your right and left arms. A blood pressure reading that is higher than normal on one occasion does  not mean that you need treatment. If it is not clear whether you have high blood pressure, you may be asked to return on a different day to have your blood pressure checked again. Or, you may be asked to monitor your blood pressure at home for 1 or more weeks. How is this treated? Treating high blood pressure includes making lifestyle changes and possibly taking medicine. Living a healthy lifestyle can help lower high blood pressure. You may need to change some of your habits. Lifestyle changes may include:  Following the DASH diet. This diet is high in fruits, vegetables, and whole grains. It is low in salt, red meat, and added sugars.  Keep your sodium intake below 2,300 mg per day.  Getting at least 30-45 minutes of aerobic exercise at least 4 times per week.  Losing weight if necessary.  Not smoking.  Limiting alcoholic beverages.  Learning ways to reduce stress. Your health care provider may prescribe medicine if lifestyle changes are not enough to get your blood pressure under control, and if one of the following is true:  You are 18-59 years of age and your systolic blood pressure is above 140.  You are 60 years of age or older, and your systolic blood pressure is above 150.  Your diastolic blood pressure is above 90.  You have diabetes, and your systolic blood pressure is over 140 or your diastolic blood pressure is over 90.  You have kidney disease and your blood pressure is above 140/90.  You have heart disease and your blood pressure is above 140/90. Your personal target blood pressure may vary depending on your medical   conditions, your age, and other factors. Follow these instructions at home:  Have your blood pressure rechecked as directed by your health care provider.  Take medicines only as directed by your health care provider. Follow the directions carefully. Blood pressure medicines must be taken as prescribed. The medicine does not work as well when you skip  doses. Skipping doses also puts you at risk for problems.  Do not smoke.  Monitor your blood pressure at home as directed by your health care provider. Contact a health care provider if:  You think you are having a reaction to medicines taken.  You have recurrent headaches or feel dizzy.  You have swelling in your ankles.  You have trouble with your vision. Get help right away if:  You develop a severe headache or confusion.  You have unusual weakness, numbness, or feel faint.  You have severe chest or abdominal pain.  You vomit repeatedly.  You have trouble breathing. This information is not intended to replace advice given to you by your health care provider. Make sure you discuss any questions you have with your health care provider. Document Released: 03/07/2005 Document Revised: 08/13/2015 Document Reviewed: 12/28/2012 Elsevier Interactive Patient Education  2017 [employer] Epilepsy Epilepsy is when a person keeps having seizures. A seizure is unusual activity in the brain. A seizure can change how you think or behave, and it can make it hard to be aware of what is happening. This condition can cause problems, such as:  Falls, accidents, and injury.  Depression.  Poor memory.  Sudden unexplained death in epilepsy (SUDEP). This is rare. Its cause is not known. Most people with epilepsy lead normal lives. Follow these instructions at home: Medicines  Take medicines only as told by your doctor.  Avoid anything that may keep your medicine from working, such as alcohol. Activity  Get enough rest. Lack of sleep can make seizures more likely to occur.  Follow your doctor's advice about driving, swimming, and doing anything else that would be dangerous if you had a seizure. Teaching others  Teach friends and family what to do if you have a seizure. They should:  Lay you on the ground to prevent a fall.  Cushion your head and body.  Loosen any tight clothing  around your neck.  Turn you on your side.  Stay with you until you are better.  Not hold you down.  Not put anything in your mouth.  Know whether or not you need emergency care. General instructions  Avoid anything that causes you to have seizures.  Keep a seizure diary. Write down what you remember about each seizure, and especially what might have caused it.  Keep all follow-up visits as told by your doctor. This is important. Contact a doctor if:  You have a change in your seizure pattern.  You get an infection or start to feel sick. You may have more seizures when you are sick. Get help right away if:  A seizure does not stop after 5 minutes.  You have more than one seizure in a row, and you do not have enough time between the seizures to feel better.  A seizure makes it harder to breathe.  A seizure is different from other seizures you have had.  A seizure makes you unable to speak or use a part of your body.  You did not wake up right after a seizure. This information is not intended to replace advice given to you by your health  care provider. Make sure you discuss any questions you have with your health care provider. Document Released: 01/02/2009 Document Revised: 10/12/2015 Document Reviewed: 09/15/2015 Elsevier Interactive Patient Education  2017 [employer].

## 2016-04-29 NOTE — Progress Notes (Signed)
[name] [name], is a 56 y.o. male  [id number]  [id number]  DOB - 1960-08-19  Chief Complaint  Patient presents with  . Fatigue       Subjective:   [name] [name] is a 56 y.o. male with history of aneurysmal rupture and subsequent seizure disorder, hypertension and hepatitis C here today for a follow up visit. Patient's major concern today again is that he would like to go back on Tegretol because that is the only medication that works for him. He claims that since being off Tegretol, his seizure frequency has increased to about 2-3 per week from 1 week. He has not seen Neurologist for long time. He did say he has not had seizure for a month but he definitely wants to start back on Tegretol. Meanwhile, this medication was stopped because of persistent hyponatremia. He has no new complain today. Patient has No headache, No chest pain, No abdominal pain - No Nausea, No new weakness tingling or numbness, No Cough - SOB.  No problems updated. ALLERGIES: Allergies  Allergen Reactions  . Carbamazepine     Hyponatremia   PAST MEDICAL HISTORY: Past Medical History:  Diagnosis Date  . Aneurysm (HCC)   . ETOHism (HCC)   . Hepatitis C   . 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 Hypertension   . Seizure [facility](HCC)    MEDICATIONS AT HOME: Prior to Admission medications   Medication Sig Start Date End Date Taking? Authorizing Provider  diclofenac sodium (VOLTAREN) 1 % GEL Apply 4 g topically 4 (four) times daily. To elbow. Please instruct in dosing. 02/11/15  Yes Linna HoffJames D Kindl, MD  FLUoxetine (PROZAC) 10 MG capsule Take by mouth daily. 02/24/15  Yes Historical Provider, MD  levETIRAcetam (KEPPRA) 750 MG tablet Take 2 tablets (1,500 mg total) by mouth in the morning and in the evening daily. 04/14/16  Yes Butch PennyMegan Millikan, NP  metoprolol tartrate (LOPRESSOR) 25 MG tablet Take 1 tablet (25 mg total) by mouth 2 (two) times daily. 04/18/16  Yes Quentin Angstlugbemiga E Jessicca Stitzer, MD    QUEtiapine (SEROQUEL) 50 MG tablet Take 50 mg by mouth 2 (two) times daily. 04/11/15  Yes Historical Provider, MD  thiamine (VITAMIN B-1) 100 MG tablet Take by mouth. 10/27/12  Yes Historical Provider, MD  Vitamin D, Ergocalciferol, (DRISDOL) 50000 units CAPS capsule Take 1 capsule (50,000 Units total) by mouth every 7 (seven) days. 10/26/15  Yes Tiffany Netta CedarsS Noel, PA-C  carbamazepine (TEGRETOL) 200 MG tablet Take 1 tablet PO TID for 1 week, 1/2 tablet PO TID for 1 week, 1/2 Tablet PO BID for 1 week then stop medication Patient not taking: Reported on 01/28/2016 05/07/15   Quentin Angstlugbemiga E Travion Ke, MD  divalproex (DEPAKOTE ER) 500 MG 24 hr tablet take 1 TABLET BY MOUTH EVERY MORNING, then 2 TABLET BY MOUTH at bedtime 02/16/16   Historical Provider, MD  folic acid (FOLVITE) 1 MG tablet Take 1 tablet (1 mg total) by mouth daily. Patient not taking: Reported on 01/28/2016 08/15/14   Doris Cheadleeepak Advani, MD  LATUDA 40 MG TABS tablet take 1 TABLET BY MOUTH with food in the morning 04/14/16   Historical Provider, MD  mirtazapine (REMERON) 15 MG tablet Take 15 mg by mouth at bedtime. 04/14/16   Historical Provider, MD  pregabalin (LYRICA) 50 MG capsule Take 2 tablets in the morning, 1 tablet at noon and 2 tablets at bedtime. Patient not taking: Reported on 01/28/2016 09/03/14   York Spanielharles K [name], MD   Objective:   Vitals:  04/27/16 1536  BP: 129/78  Pulse: 91  Resp: 18  Temp: 98.1 F (36.7 C)  TempSrc: Oral  SpO2: 94%  Weight: 212 lb 12.8 oz (96.5 kg)  Height: 6\' 4"  (1.93 m)   Exam General appearance : Awake, alert, not in any distress. Speech Clear. Not toxic looking, unkempt, strong smell of ammonia from stale urine.  HEENT: Atraumatic and Normocephalic, pupils equally reactive to light and accomodation Neck: Supple, no JVD. No cervical lymphadenopathy.  Chest: Good air entry bilaterally, no added sounds  CVS: S1 S2 regular, no murmurs.  Abdomen: Bowel sounds present, Non tender and not distended with no  gaurding, rigidity or rebound. Extremities: B/L Lower Ext shows no edema, both legs are warm to touch Neurology: Awake alert, and oriented X 3, CN II-XII intact, Non focal Skin: No Rash  Data Review Lab Results  Component Value Date   HGBA1C 5.7 (H) 01/24/2014    Assessment & Plan   1. Chronic pain syndrome  Continue current med  2. Seizures (HCC)  - Ambulatory referral to Neurology for medication management and possible Tegretol restart  3. Essential hypertension, benign  - COMPLETE METABOLIC PANEL WITH GFR   Return in about 6 months (around 10/25/2016) for Seizure Disorder, Follow up HTN.  The patient was given clear instructions to go to ER or return to medical center if symptoms don't improve, worsen or new problems develop. The patient verbalized understanding. The patient was told to call to get lab results if they haven't heard anything in the next week.   This note has been created with Education officer, environmental. Any transcriptional errors are unintentional.    Jeanann Lewandowsky, MD, MHA, FACP, FAAP, CPE [facility] [place], [place] [phone]   04/29/2016, 1:09 PM

## 2016-05-12 ENCOUNTER — Telehealth: Payer: Self-pay | Admitting: *Deleted

## 2016-05-12 NOTE — Telephone Encounter (Signed)
-----   Message from Quentin Angstlugbemiga E Jegede, MD sent at 04/29/2016  9:20 AM EST ----- Please inform patient that his sodium level is still low and therefore not safe to prescribe tegretol at this time. Please continue current seizure medications and follow up with Neurologist as referred.

## 2016-05-12 NOTE — Telephone Encounter (Signed)
MA unable to leave a message due to contact number ringing and disconnecting. MA did not receive an answer from the psychotherapeutic contact number either.

## 2016-05-25 ENCOUNTER — Ambulatory Visit: Payer: Medicaid Other | Admitting: Neurology

## 2016-05-25 ENCOUNTER — Telehealth: Payer: Self-pay | Admitting: Neurology

## 2016-05-25 NOTE — Telephone Encounter (Signed)
This patient did not show for a revisit appointment today. This represents his third no-show within the last 12 months. The patient will be discharged from our practice.

## 2016-05-26 ENCOUNTER — Ambulatory Visit (INDEPENDENT_AMBULATORY_CARE_PROVIDER_SITE_OTHER): Payer: Medicaid Other | Admitting: Neurology

## 2016-05-26 ENCOUNTER — Encounter: Payer: Self-pay | Admitting: Neurology

## 2016-05-26 VITALS — BP 138/82 | HR 56 | Ht 76.0 in | Wt 184.5 lb

## 2016-05-26 DIAGNOSIS — Z5181 Encounter for therapeutic drug level monitoring: Secondary | ICD-10-CM | POA: Diagnosis not present

## 2016-05-26 DIAGNOSIS — R569 Unspecified convulsions: Secondary | ICD-10-CM

## 2016-05-26 DIAGNOSIS — R269 Unspecified abnormalities of gait and mobility: Secondary | ICD-10-CM

## 2016-05-26 HISTORY — DX: Unspecified abnormalities of gait and mobility: R26.9

## 2016-05-26 NOTE — Progress Notes (Signed)
Reason for visit: Seizures  [name] [name] is an 56 y.o. male  History of present illness:  Mr. [name] [name] is a 56 year old right-handed black male with a history of intractable seizures. 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 The patient currently is living alone, but he does have Psychotherapeutic Services coming into help him once a week, they deliver his medications. The patient takes the medications on his own, he remains confused oftentimes about what he is actually taking. He was taken off of carbamazepine secondary to hyponatremia. The patient claims that he has had increased seizures since that time, having at least 1 a week. He has significant gait instability, he may fall on occasion, he uses a cane for ambulation. The actual history concerning the seizures is unknown, the patient is quite confused, none of his seizures have been observed. He has recently fallen and cut the right brow within the last week he claims. The patient is on Keppra and Depakote currently.  Past Medical History:  Diagnosis Date  . Aneurysm (HCC)   . ETOHism (HCC)   . Gait abnormality 05/26/2016  . Hepatitis C   . 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 Hypertension   . Seizure [facility](HCC)     Past Surgical History:  Procedure Laterality Date  . CEREBRAL AN[facility] REPAIR     [facility]  . HARDWARE REMOVAL Left 10/29/2012   Procedure: HARDWARE REMOVAL;  Surgeon: Sheral Apleyimothy D Murphy, MD;  [facility];  Service: Orthopedics;  Laterality: Left;  . I&D EXTREMITY Left 10/26/2012   Procedure: IRRIGATION AND DEBRIDEMENT Left Elbow;  Surgeon: Sheral Apleyimothy D Murphy, MD;  Location: [facility] OR;  Service: Orthopedics;  Laterality: Left;  . I&D EXTREMITY Left 10/29/2012   Procedure: IRRIGATION AND DEBRIDEMENT EXTREMITY, wound vac change, stimulan beads;  Surgeon: Sheral Apleyimothy D Murphy, MD;  Location: [facility] OR;  Service: Orthopedics;   Laterality: Left;  lateral on bean bag  . I&D EXTREMITY Left 11/02/2012   Procedure: IRRIGATION AND DEBRIDEMENT EXTREMITY;  Surgeon: Sheral Apleyimothy D Murphy, MD;  Location: [facility] OR;  Service: Orthopedics;  Laterality: Left;  . INCISION AND DRAINAGE ABSCESS Left 11/02/2012   Procedure: INCISION AND DRAINAGE ABSCESS;  Surgeon: Sheral Apleyimothy D Murphy, MD;  Location: [facility] OR;  Service: Orthopedics;  Laterality: Left;    Family History  Problem Relation Age of Onset  . Hypertension Mother   . Cancer Mother   . Hypertension Father   . Diabetes Sister   . Cancer Sister   . Cancer Sister     Social history:  reports that he has quit smoking. His smoking use included Cigarettes. He smoked 0.25 packs per day. He has never used smokeless tobacco. He reports that he does not drink alcohol or use drugs.    Allergies  Allergen Reactions  . Carbamazepine     Hyponatremia    Medications:  Prior to Admission medications   Medication Sig Start Date End Date Taking? Authorizing Provider  diclofenac sodium (VOLTAREN) 1 % GEL Apply 4 g topically 4 (four) times daily. To elbow. Please instruct in dosing. 02/11/15  Yes Linna HoffJames D Kindl, MD  divalproex (DEPAKOTE ER) 500 MG 24 hr tablet take 1 TABLET BY MOUTH EVERY MORNING 02/16/16  Yes Historical Provider, MD  levETIRAcetam (KEPPRA) 750 MG tablet Take 2 tablets (1,500 mg total) by mouth in the morning and in the evening daily. 04/14/16  Yes Butch PennyMegan Millikan, NP  metoprolol tartrate (LOPRESSOR)  25 MG tablet Take 1 tablet (25 mg total) by mouth 2 (two) times daily. 04/18/16  Yes Quentin Angst, MD  QUEtiapine (SEROQUEL) 50 MG tablet Take 50 mg by mouth daily.  04/11/15  Yes Historical Provider, MD  sertraline (ZOLOFT) 100 MG tablet Take 100 mg by mouth daily.   Yes Historical Provider, MD  Vitamin D, Ergocalciferol, (DRISDOL) 50000 units CAPS capsule Take 1 capsule (50,000 Units total) by mouth every 7 (seven) days. 10/26/15  Yes Tiffany Netta Cedars, PA-C    ROS:  Out of a  complete 14 system review of symptoms, the patient complains only of the following symptoms, and all other reviewed systems are negative.  Seizures Gait disorder Decreased activity Runny nose Cough Incontinence of the bladder Back pain, walking difficulty Memory loss, weakness, tremors Agitation, decreased concentration  Blood pressure 138/82, pulse (!) 56, height 6\' 4"  (1.93 m), weight 184 lb 8 oz (83.7 kg).  Physical Exam  General: The patient is alert and cooperative at the time of the examination.  Skin: No significant peripheral edema is noted.   Neurologic Exam  Mental status: The patient is alert and oriented x 2 at the time of the examination (not oriented to date). The Mini-Mental Status Examination done today shows a total score 20/30.   Cranial nerves: Facial symmetry is present. Speech is normal, no aphasia or dysarthria is noted. Extraocular movements are full. Visual fields are full.  Motor: The patient has good strength in all 4 extremities.  Sensory examination: Soft touch sensation is symmetric on the face, arms, and legs.  Coordination: The patient has good finger-nose-finger and heel-to-shin bilaterally.  Gait and station: The patient has a very wide-based unsteady gait, the patient uses a cane for ambulation. Tandem gait was not attempted. Romberg is negative, but is unsteady. No drift is seen.  Reflexes: Deep tendon reflexes are symmetric.   Assessment/Plan:  1. History of intractable seizures  2. Cognitive impairment, dementia  3. Gait disorder  The patient currently is living alone, is not clear that he is getting his medications in properly and it is unclear exactly how many seizures he is having. The history is coming from the patient who is quite confused, he is unsure of his medications or any chronology of events. The patient appears to be willing to go to an assisted living situation, I will fill out an FL-2 form today. The patient will have  blood work done today, he will follow-up in 6 months. I will not place him back on carbamazepine as this resulted in hyponatremia, we may need to go up on the Depakote dosing.  Psychotherapeutic services is following him, telephone number is [phone], fax number [phone]. A Child psychotherapist does work with him through this office. The FL-2 form will be faxed to this office.  Marlan Palau MD 05/26/2016 11:02 AM  [facility] [address] [place], [place] [address]  Phone [phone] Fax [phone]

## 2016-05-27 ENCOUNTER — Telehealth: Payer: Self-pay | Admitting: Neurology

## 2016-05-27 ENCOUNTER — Telehealth: Payer: Self-pay | Admitting: *Deleted

## 2016-05-27 ENCOUNTER — Encounter: Payer: Self-pay | Admitting: *Deleted

## 2016-05-27 DIAGNOSIS — Z5181 Encounter for therapeutic drug level monitoring: Secondary | ICD-10-CM

## 2016-05-27 MED ORDER — LACOSAMIDE 50 MG PO TABS
ORAL_TABLET | ORAL | 2 refills | Status: DC
Start: 2016-05-27 — End: 2016-07-11

## 2016-05-27 NOTE — Telephone Encounter (Signed)
Called and spoke with [name] [name] from [facility]. Advised tried faxing new rx vimpat and fax failed twice. He stated he actually received rx. Verified it was for 50mg  tab 1 cap 2x/day for 2 weeks then 2 caps twice daily. Qty 120, 2 refills. He verbalized understanding.

## 2016-05-27 NOTE — Telephone Encounter (Signed)
I called the psychological services line, left a message, bloodwork as shown a very low platelet level, apparently this was determined back in the summer of 2017, but was never rechecked. The patient is on medications that can cause low platelet levels such as Keppra and Depakote.  In my experience, Depakote is more likely to cause this, we stop the medication now.  I will call in a prescription for Vimpat to replace the Depakote for seizure control. We may need to come off of Keppra as well if the platelet levels do not recover.  I will recheck blood work in about 3 weeks.  I have tried to call the patient directly, his telephone does not except calls.

## 2016-05-27 NOTE — Telephone Encounter (Signed)
Helmut MusterAlicia with psychological services called office returning Dr. Anne HahnWillis' call.  Please call

## 2016-05-27 NOTE — Telephone Encounter (Signed)
I called and talked with [name] [name], I went over the plan with this patient, he will stop the Depakote, start Vimpat, come back in 3 weeks for a CBC.

## 2016-05-27 NOTE — Progress Notes (Signed)
Signed/completed form by CW,MD for [employer]  med[place] long term care services faxed to [phone]. Received confirmation.

## 2016-05-29 LAB — COMPREHENSIVE METABOLIC PANEL
ALT: 11 IU/L (ref 0–44)
AST: 31 IU/L (ref 0–40)
Albumin/Globulin Ratio: 1 — ABNORMAL LOW (ref 1.2–2.2)
Albumin: 4.1 g/dL (ref 3.5–5.5)
Alkaline Phosphatase: 55 IU/L (ref 39–117)
BUN/Creatinine Ratio: 28 — ABNORMAL HIGH (ref 9–20)
BUN: 25 mg/dL — ABNORMAL HIGH (ref 6–24)
Bilirubin Total: 1 mg/dL (ref 0.0–1.2)
CALCIUM: 8.8 mg/dL (ref 8.7–10.2)
CO2: 25 mmol/L (ref 18–29)
CREATININE: 0.9 mg/dL (ref 0.76–1.27)
Chloride: 96 mmol/L (ref 96–106)
GFR calc Af Amer: 109 mL/min/{1.73_m2} (ref >59)
GFR, EST NON AFRICAN AMERICAN: 94 mL/min/{1.73_m2} (ref >59)
Globulin, Total: 4.3 g/dL (ref 1.5–4.5)
Glucose: 82 mg/dL (ref 65–99)
POTASSIUM: 4.1 mmol/L (ref 3.5–5.2)
Sodium: 138 mmol/L (ref 134–144)
Total Protein: 8.4 g/dL (ref 6.0–8.5)

## 2016-05-29 LAB — CBC WITH DIFFERENTIAL/PLATELET
BASOS: 0 %
Basophils Absolute: 0 10*3/uL (ref 0.0–0.2)
EOS (ABSOLUTE): 0 10*3/uL (ref 0.0–0.4)
EOS: 1 %
HEMATOCRIT: 46.4 % (ref 37.5–51.0)
Hemoglobin: 15.6 g/dL (ref 13.0–17.7)
IMMATURE GRANS (ABS): 0 10*3/uL (ref 0.0–0.1)
IMMATURE GRANULOCYTES: 0 %
LYMPHS: 52 %
Lymphocytes Absolute: 2.6 10*3/uL (ref 0.7–3.1)
MCH: 34.3 pg — ABNORMAL HIGH (ref 26.6–33.0)
MCHC: 33.6 g/dL (ref 31.5–35.7)
MCV: 102 fL — ABNORMAL HIGH (ref 79–97)
Monocytes Absolute: 0.6 10*3/uL (ref 0.1–0.9)
Monocytes: 11 %
NEUTROS PCT: 36 %
Neutrophils Absolute: 1.9 10*3/uL (ref 1.4–7.0)
PLATELETS: 39 10*3/uL — AB (ref 150–379)
RBC: 4.55 x10E6/uL (ref 4.14–5.80)
RDW: 13.6 % (ref 12.3–15.4)
WBC: 5.1 10*3/uL (ref 3.4–10.8)

## 2016-05-29 LAB — LEVETIRACETAM LEVEL: LEVETIRACETAM: 34.2 ug/mL (ref 10.0–40.0)

## 2016-05-29 LAB — AMMONIA: AMMONIA: 57 ug/dL (ref 27–102)

## 2016-05-29 LAB — VALPROIC ACID LEVEL: VALPROIC ACID LVL: 89 ug/mL (ref 50–100)

## 2016-05-31 ENCOUNTER — Encounter: Payer: Self-pay | Admitting: Neurology

## 2016-06-13 ENCOUNTER — Other Ambulatory Visit: Payer: Self-pay | Admitting: Physician Assistant

## 2016-06-17 ENCOUNTER — Telehealth: Payer: Self-pay | Admitting: Neurology

## 2016-06-17 DIAGNOSIS — D696 Thrombocytopenia, unspecified: Secondary | ICD-10-CM

## 2016-06-17 NOTE — Telephone Encounter (Signed)
I called the psychological services, the patient these to come in next week to have a CBC drawn, he has come off of the Depakote. Platelet levels were extremely low, this needs to be rechecked.

## 2016-06-20 NOTE — Telephone Encounter (Signed)
Diana/Psycho Carma Leaven [phone] called said Lawson Fiscal NP asked to make an appt reg vimpat. Lafonda Mosses said she believes there is a problem with the medication but was not told what the appt would be for. Said the pt was to come back in 3 weeks but has not heard from clinic. Operator advised he was to come back for labs in 3 weeks, the orders have been entered, gave hours for the lab. Said she would relay the message. She said could RN please call reg vimpat and can ask to speak with any nurse there.

## 2016-06-21 NOTE — Telephone Encounter (Signed)
LM for them to call back and discuss further

## 2016-06-23 NOTE — Addendum Note (Signed)
Addended by: York Spaniel on: 06/23/2016 05:20 PM   Modules accepted: Orders

## 2016-06-23 NOTE — Telephone Encounter (Signed)
I called the nurse practitioner, Jacki Cones, they had concerns about the Vimpat, they are concerned that the patient is also drinking alcohol, Vimpat is mainly renally cleared.  They wish to have a urine drug screen when he comes in, I will order this.

## 2016-06-23 NOTE — Telephone Encounter (Addendum)
Took call from phone room. Spoke with Cathlean Cower with psychological services. I verified pt should come in this week for repeat labs. Advised office open until 5pm and tomorrow 8-12pm. She does not think she will come today to bring him for labs. She stopped by earlier and he had been drinking beer. Going to try to come tomorrow.   She wants to know if CW,MD willing to add on urine drug screen OR serum drug screen. Dr Guadlupe Spanish (psychologist) ordered those today and wondering if Dr Anne Hahn ok to add those to the lab orders.    Sharman Crate, NP also requesting call from CW,MD to discuss rx vimpat. She had some concerns of pt being on medication. Cathlean Cower did not know details as to what her concerns were. Phone to reach her: [phone].

## 2016-06-23 NOTE — Telephone Encounter (Signed)
Called and LVM for them to call back since no return call yet. Need to get more information regarding message

## 2016-06-24 ENCOUNTER — Other Ambulatory Visit (INDEPENDENT_AMBULATORY_CARE_PROVIDER_SITE_OTHER): Payer: Self-pay

## 2016-06-24 DIAGNOSIS — Z0289 Encounter for other administrative examinations: Secondary | ICD-10-CM

## 2016-06-24 DIAGNOSIS — Z5181 Encounter for therapeutic drug level monitoring: Secondary | ICD-10-CM

## 2016-06-24 DIAGNOSIS — D696 Thrombocytopenia, unspecified: Secondary | ICD-10-CM

## 2016-06-25 LAB — CBC WITH DIFFERENTIAL/PLATELET
BASOS ABS: 0 10*3/uL (ref 0.0–0.2)
Basos: 0 %
EOS (ABSOLUTE): 0.1 10*3/uL (ref 0.0–0.4)
Eos: 1 %
HEMOGLOBIN: 14.8 g/dL (ref 13.0–17.7)
Hematocrit: 41.6 % (ref 37.5–51.0)
IMMATURE GRANS (ABS): 0 10*3/uL (ref 0.0–0.1)
IMMATURE GRANULOCYTES: 0 %
LYMPHS: 61 %
Lymphocytes Absolute: 2.9 10*3/uL (ref 0.7–3.1)
MCH: 34.2 pg — AB (ref 26.6–33.0)
MCHC: 35.6 g/dL (ref 31.5–35.7)
MCV: 96 fL (ref 79–97)
MONOCYTES: 9 %
Monocytes Absolute: 0.4 10*3/uL (ref 0.1–0.9)
NEUTROS ABS: 1.4 10*3/uL (ref 1.4–7.0)
Neutrophils: 29 %
Platelets: 138 10*3/uL — ABNORMAL LOW (ref 150–379)
RBC: 4.33 x10E6/uL (ref 4.14–5.80)
RDW: 14 % (ref 12.3–15.4)
WBC: 4.8 10*3/uL (ref 3.4–10.8)

## 2016-06-29 LAB — DRUG SCREEN, RX+ALC+SALICYLATES, BLD
ACETONE, BLOOD: NEGATIVE % (ref 0.000–0.010)
BUTALBITAL, SERUM: NOT DETECTED ug/mL (ref 1–10)
CHLORDIAZEPOXIDE-SERODP: NOT DETECTED ug/mL (ref 0.1–0.9)
DIAZEPAM-SERODP: NOT DETECTED ug/mL (ref 0.1–0.9)
Ethanol, blood: NEGATIVE % (ref 0.000–0.010)
Isopropanol, blood: NEGATIVE % (ref 0.000–0.010)
Methanol, blood: NEGATIVE % (ref 0.000–0.010)
NORCHLORDIAZEPOXIDE-SERODP: NOT DETECTED ug/mL (ref 0.1–0.6)
Nordiazepam: NOT DETECTED ug/mL (ref 0.1–1.4)
Pentobarbital, Serum: NOT DETECTED ug/mL (ref 1–5)
Phenobarbital, Serum: NOT DETECTED ug/mL (ref 15–40)

## 2016-07-05 ENCOUNTER — Telehealth: Payer: Self-pay | Admitting: Neurology

## 2016-07-05 NOTE — Telephone Encounter (Signed)
I called the psychiatry office, urine drug screen was unremarkable, platelet level has markedly improved off of Depakote.

## 2016-07-11 ENCOUNTER — Telehealth: Payer: Self-pay | Admitting: Neurology

## 2016-07-11 MED ORDER — LACOSAMIDE 100 MG PO TABS: 100.0000 mg | ORAL_TABLET | Freq: Two times a day (BID) | ORAL | 3 refills | Status: DC

## 2016-07-11 NOTE — Addendum Note (Signed)
Addended by: York Spaniel on: 07/11/2016 05:11 PM   Modules accepted: Orders

## 2016-07-11 NOTE — Telephone Encounter (Signed)
Alice with [facility] is calling requesting a hard copy of patients lacosamide (VIMPAT) 50 MG TABS tablet.  They are trying to have patient placed in an assisted living facility [facility]).  Please call . [facility] main number [phone]

## 2016-07-11 NOTE — Telephone Encounter (Signed)
A prescription for Vimpat will be generated.

## 2016-07-11 NOTE — Telephone Encounter (Signed)
Called and spoke with [place]. She stated [facility] facility is requesting hard copy of rx Vimpat. They are trying to get him placed by this Wednesday. Advised I will make sure CW,MD okay with this and will call once ready to pick up. She verbalized understanding.   Contact info for facility: [address], [place], [place] [address] Phone: [phone]

## 2016-07-12 ENCOUNTER — Other Ambulatory Visit: Payer: Self-pay | Admitting: Internal Medicine

## 2016-07-12 ENCOUNTER — Telehealth: Payer: Self-pay | Admitting: *Deleted

## 2016-07-12 ENCOUNTER — Other Ambulatory Visit: Payer: Self-pay | Admitting: Physician Assistant

## 2016-07-12 DIAGNOSIS — I10 Essential (primary) hypertension: Secondary | ICD-10-CM

## 2016-07-12 NOTE — Telephone Encounter (Signed)
MA spoke with Rachael Darby (patients SW). She stated the [facility] facility is requesting an H&P MA contacted Nia AT [phone]. She was very rude and stated the patient was being admitted tomorrow and they needed records on the patient. MA asked if a release form had been completed and faxed over for medical records. Nia stated she did not have a release and again stated "DO you have the information or not". MA informed her of the facility having progress notes from the visit but I am not authorized at this time to release them without proper consent. Nia stated "don't worry about it" I will contact the hospital. MA contacted the SW back with this information and she stated she has a release form and will send it to the [facility] facility and have the records released to herself and take them to the [facility] facility. No further questions at this time.

## 2016-07-12 NOTE — Telephone Encounter (Signed)
Called and spoke with Alica from [facility]. Advised rx ready for pick up. She verbalized understanding. They will come and pick up printed rx for patient.

## 2016-08-31 ENCOUNTER — Telehealth: Payer: Self-pay | Admitting: Neurology

## 2016-08-31 NOTE — Telephone Encounter (Signed)
[name] [name] called office back. Took call from phone staff. Verified patient depakote stopped. He is taking Keppra and Vimpat. She verbalized understanding. Nothing further needed at this time.

## 2016-08-31 NOTE — Telephone Encounter (Signed)
Albin FellingCarla @ [facility] calls [phone] is wanting to know other than Sheralyn BoatmanKepra should pt be still taking Vimpat or Kepra and Depracote.  Based on notes they have from March of this year Pt was supposed to be taking the [place] and Depracote for anti seizures .

## 2016-08-31 NOTE — Telephone Encounter (Signed)
LVM returning call. Gave [facility] phone number for return call.

## 2016-11-28 ENCOUNTER — Telehealth: Payer: Self-pay | Admitting: *Deleted

## 2016-11-28 ENCOUNTER — Encounter (INDEPENDENT_AMBULATORY_CARE_PROVIDER_SITE_OTHER): Payer: Self-pay

## 2016-11-28 ENCOUNTER — Ambulatory Visit (INDEPENDENT_AMBULATORY_CARE_PROVIDER_SITE_OTHER): Payer: Medicaid Other | Admitting: Adult Health

## 2016-11-28 ENCOUNTER — Encounter: Payer: Self-pay | Admitting: Adult Health

## 2016-11-28 VITALS — BP 114/66 | HR 68 | Wt 224.4 lb

## 2016-11-28 DIAGNOSIS — G40919 Epilepsy, unspecified, intractable, without status epilepticus: Secondary | ICD-10-CM | POA: Diagnosis not present

## 2016-11-28 NOTE — Patient Instructions (Signed)
Your Plan:  Continue Vimpat and Keppra If you have any seizure events please let us know Please tell the staff at [facility] if you have a seizure If your symptoms worsen or you develop new symptoms please let us know.   [facility] you [facility]. I hope we have been able to provide you high quality care today.  You may receive a patient satisfaction survey over the next few weeks. We would appreciate your feedback and comments so that we may continue to improve ourselves and the health of our patients.

## 2016-11-28 NOTE — Telephone Encounter (Signed)
Noted. We will continue to monitor at this time.

## 2016-11-28 NOTE — Progress Notes (Signed)
I have read the note, and I agree with the clinical assessment and plan.  Hansford Hirt KEITH   

## 2016-11-28 NOTE — Progress Notes (Signed)
PATIENT: [name] [name] DOB: 1960/06/10  REASON FOR VISIT: follow up- intractable seizures HISTORY FROM: patient  HISTORY OF PRESENT ILLNESS: Today 11/28/16 [name] [name] is a 56 year old male with a history of intractable seizures. He returns today for follow-up. He is here alone today. The patient is unsure of his medications. However I do have a medication list from [facility]. It appears that he is taking Keppra and Vimpat. Patient states that he had a seizure last week. This was unwitnessed. He states that he was in his room. He did not notify the staff that he had had a seizure. He is unable to give me details of what happened to make him feel that he had a seizure. He does not operate a motor vehicle. He states that he does have trouble with his gait therefore he uses a cane but may consider getting a walker in the future. He returns today for an evaluation.  HISTORY 05/26/16: [name] [name] is a 56 year old right-handed black male with a history of intractable seizures. 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 The patient currently is living alone, but he does have [facility] coming into help him once a week, they deliver his medications. The patient takes the medications on his own, he remains confused oftentimes about what he is actually taking. He was taken off of carbamazepine secondary to hyponatremia. The patient claims that he has had increased seizures since that time, having at least 1 a week. He has significant gait instability, he may fall on occasion, he uses a cane for ambulation. The actual history concerning the seizures is unknown, the patient is quite confused, none of his seizures have been observed. He has recently fallen and cut the right brow within the last week he claims. The patient is on Keppra and Depakote currently.  REVIEW OF SYSTEMS: Out of a complete 14 system review of  symptoms, the patient complains only of the following symptoms, and all other reviewed systems are negative.  Unexpected weight change, excessive sweating, hearing loss, ringing in ears, runny nose, blurred vision, shortness of breath, insomnia, memory loss, dizziness, headache, seizure, passing out, bruise/bleed easily  ALLERGIES: Allergies  Allergen Reactions  . Carbamazepine     Hyponatremia    HOME MEDICATIONS: Outpatient Medications Prior to Visit  Medication Sig Dispense Refill  . diclofenac sodium (VOLTAREN) 1 % GEL Apply 4 g topically 4 (four) times daily. To elbow. Please instruct in dosing. 1 Tube 2  . Lacosamide (VIMPAT) 100 MG TABS Take 1 tablet (100 mg total) by mouth 2 (two) times daily. 60 tablet 3  . levETIRAcetam (KEPPRA) 750 MG tablet Take 2 tablets (1,500 mg total) by mouth in the morning and in the evening daily. 120 tablet 2  . metoprolol tartrate (LOPRESSOR) 25 MG tablet TAKE 1 TABLET BY MOUTH TWICE DAILY 60 tablet 3  . QUEtiapine (SEROQUEL) 50 MG tablet Take 50 mg by mouth daily.   1  . sertraline (ZOLOFT) 100 MG tablet Take 100 mg by mouth daily.    . Vitamin D, Ergocalciferol, (DRISDOL) 50000 units CAPS capsule Take 1 capsule (50,000 Units total) by mouth every 7 (seven) days. 30 capsule 0   No facility-administered medications prior to visit.     PAST MEDICAL HISTORY: Past Medical History:  Diagnosis Date  . Aneurysm (HCC)   . ETOHism (HCC)   . Gait abnormality 05/26/2016  . Hepatitis C   . 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 Hypertension   . Seizure (HCC)     PAST SURGICAL HISTORY: Past Surgical History:  Procedure Laterality Date  . CEREBRAL ANEURYSM REPAIR     At [facility]  . HARDWARE REMOVAL Left 10/29/2012   Procedure: HARDWARE REMOVAL;  Surgeon: Sheral Apley, MD;  Location: [facility] OR;  Service: Orthopedics;  Laterality: Left;  . I&D EXTREMITY Left 10/26/2012   Procedure: IRRIGATION AND DEBRIDEMENT Left Elbow;   Surgeon: Sheral Apley, MD;  Location: [facility] OR;  Service: Orthopedics;  Laterality: Left;  . I&D EXTREMITY Left 10/29/2012   Procedure: IRRIGATION AND DEBRIDEMENT EXTREMITY, wound vac change, stimulan beads;  Surgeon: Sheral Apley, MD;  Location: [facility] OR;  Service: Orthopedics;  Laterality: Left;  lateral on bean bag  . I&D EXTREMITY Left 11/02/2012   Procedure: IRRIGATION AND DEBRIDEMENT EXTREMITY;  Surgeon: Sheral Apley, MD;  Location: [facility] OR;  Service: Orthopedics;  Laterality: Left;  . INCISION AND DRAINAGE ABSCESS Left 11/02/2012   Procedure: INCISION AND DRAINAGE ABSCESS;  Surgeon: Sheral Apley, MD;  Location: [facility] OR;  Service: Orthopedics;  Laterality: Left;    FAMILY HISTORY: Family History  Problem Relation Age of Onset  . Hypertension Mother   . Cancer Mother   . Hypertension Father   . Diabetes Sister   . Cancer Sister   . Cancer Sister     SOCIAL HISTORY: Social History   Social History  . Marital status: Single    Spouse name: N/A  . Number of children: 3  . Years of education: 10 th   Occupational History  . disabled    Social History Main Topics  . Smoking status: Former Smoker    Packs/day: 0.25    Types: Cigarettes  . Smokeless tobacco: Never Used  . Alcohol use No     Comment: quit drinking 11/2011 per patient  . Drug use: No  . Sexual activity: Not Currently   Other Topics Concern  . Not on file   Social History Narrative   Patient is single and lives at home alone.   Disabled.   Education 11 th grade.   Right handed.   Caffeine none .      PHYSICAL EXAM  Vitals:   11/28/16 1313  BP: 114/66  Pulse: 68  Weight: 224 lb 6.4 oz (101.8 kg)   Body mass index is 27.31 kg/m.  Generalized: Well developed, in no acute distress   Neurological examination  Mentation: Alert oriented to time, place, history taking. Follows all commands speech and language fluent Cranial nerve II-XII: Pupils were equal round reactive to light. Extraocular  movements were full, visual field were full on confrontational test. Facial sensation and strength were normal. Uvula tongue midline. Head turning and shoulder shrug  were normal and symmetric. Motor: The motor testing reveals 5 over 5 strength of all 4 extremities. Good symmetric motor tone is noted throughout.  Sensory: Sensory testing is intact to soft touch on all 4 extremities. No evidence of extinction is noted.  Coordination: Cerebellar testing reveals good finger-nose-finger and heel-to-shin bilaterally.  Gait and station: Gait is slightly unsteady tandem gait not attended. Patient uses a cane when ambulating Reflexes: Deep tendon reflexes are symmetric and normal bilaterally.   DIAGNOSTIC DATA (LABS, IMAGING, TESTING) - I reviewed patient records, labs, notes, testing and imaging myself where available.  Lab Results  Component Value Date   WBC 4.8 06/24/2016   HGB 14.8 06/24/2016   HCT 41.6  06/24/2016   MCV 96 06/24/2016   PLT 138 (L) 06/24/2016      Component Value Date/Time   NA 138 05/26/2016 1137   K 4.1 05/26/2016 1137   CL 96 05/26/2016 1137   CO2 25 05/26/2016 1137   GLUCOSE 82 05/26/2016 1137   GLUCOSE 125 (H) 04/27/2016 1627   BUN 25 (H) 05/26/2016 1137   CREATININE 0.90 05/26/2016 1137   CREATININE 0.90 04/27/2016 1627   CALCIUM 8.8 05/26/2016 1137   PROT 8.4 05/26/2016 1137   ALBUMIN 4.1 05/26/2016 1137   AST 31 05/26/2016 1137   ALT 11 05/26/2016 1137   ALKPHOS 55 05/26/2016 1137   BILITOT 1.0 05/26/2016 1137   GFRNONAA 94 05/26/2016 1137   GFRNONAA >89 04/27/2016 1627   GFRAA 109 05/26/2016 1137   GFRAA >89 04/27/2016 1627    Lab Results  Component Value Date   HGBA1C 5.7 (H) 01/24/2014   Lab Results  Component Value Date   VITAMINB12 970 10/19/2015   Lab Results  Component Value Date   TSH 2.25 10/19/2015      ASSESSMENT AND PLAN 56 y.o. year old male  has a past medical history of Aneurysm (HCC); ETOHism (HCC); Gait abnormality  (05/26/2016); Hepatitis C; Hereditary and idiopathic peripheral neuropathy (06/03/2014); Hypertension; and Seizure (HCC). here with:  1. Intractable seizures  The patient is here alone today. It is unclear if the patient has had any additional seizures. We will call the facility to get their input on this as well. Patient will remain on Keppra and Vimpat. I have instructed the patient that if he has a seizure he should let the staff at the facility know. They will then make [place] aware. Patient voiced understanding. He is advised that if his symptoms worsen or he develops any new symptoms he should let us know. He will follow-up in 6 months or sooner if needed.  I spent 15 minutes with the patient. 50% of this time was spent discussing seizure events.      Butch Penny, MSN, NP-C 11/28/2016, 1:03 PM [facility] [address], [address] [place], [place] [address] [phone]

## 2016-11-28 NOTE — Telephone Encounter (Signed)
Per NP , called [facility] to speak with caregivers re: seizure activity. Tasha, caregiver stated she has not witnessed any seizures.  Spoke with his Med tech, [place] who stated the patient says he has seizures at night, however she has never witnessed any. She stated he stays in his room all the time, and he will say he loses a day and his train of thought.  This RN then advised her that on his MAR it states he is taking levetiracetam  750 mg, take two tablets (15000mg ) by mouth twice daily. Confirmed with Suzette BattiestVeronica he is taking 750 mg tablets and takes two tablets twice daily which is correct dosing. Suzette BattiestVeronica stated she will inform pharmacy to correct the label. She confirmed that he is receiving medication correctly. She verbalized understanding of call.

## 2016-11-29 ENCOUNTER — Telehealth: Payer: Self-pay | Admitting: Adult Health

## 2016-11-29 NOTE — Telephone Encounter (Signed)
Called [facility], LVM requesting call back.

## 2016-11-29 NOTE — Telephone Encounter (Signed)
Alecia with [facility] is calling in reference to patients visit yesterday.  She said patient is a patient of theirs and needs to know if there were any changed/updated orders they need to know about.  Please call and if any new orders please fax to [phone].

## 2016-12-01 NOTE — Telephone Encounter (Signed)
[facility] to [facility], call did not go through.

## 2017-01-10 ENCOUNTER — Encounter (HOSPITAL_COMMUNITY): Payer: Self-pay | Admitting: Emergency Medicine

## 2017-01-10 ENCOUNTER — Emergency Department (HOSPITAL_COMMUNITY): Payer: Medicaid Other

## 2017-01-10 ENCOUNTER — Inpatient Hospital Stay (HOSPITAL_COMMUNITY)
Admission: EM | Admit: 2017-01-10 | Discharge: 2017-01-20 | DRG: 309 | Disposition: A | Discharge status: Skilled Nursing Facility | Payer: Medicaid Other | Attending: Family Medicine | Admitting: Family Medicine

## 2017-01-10 DIAGNOSIS — Z9889 Other specified postprocedural states: Secondary | ICD-10-CM

## 2017-01-10 DIAGNOSIS — Z8673 Personal history of transient ischemic attack (TIA), and cerebral infarction without residual deficits: Secondary | ICD-10-CM

## 2017-01-10 DIAGNOSIS — G43909 Migraine, unspecified, not intractable, without status migrainosus: Secondary | ICD-10-CM | POA: Diagnosis present

## 2017-01-10 DIAGNOSIS — K769 Liver disease, unspecified: Secondary | ICD-10-CM | POA: Diagnosis not present

## 2017-01-10 DIAGNOSIS — I4892 Unspecified atrial flutter: Principal | ICD-10-CM | POA: Diagnosis present

## 2017-01-10 DIAGNOSIS — F03918 Unspecified dementia, unspecified severity, with other behavioral disturbance: Secondary | ICD-10-CM | POA: Diagnosis present

## 2017-01-10 DIAGNOSIS — Z8679 Personal history of other diseases of the circulatory system: Secondary | ICD-10-CM

## 2017-01-10 DIAGNOSIS — Z87891 Personal history of nicotine dependence: Secondary | ICD-10-CM

## 2017-01-10 DIAGNOSIS — I4891 Unspecified atrial fibrillation: Secondary | ICD-10-CM

## 2017-01-10 DIAGNOSIS — G4700 Insomnia, unspecified: Secondary | ICD-10-CM | POA: Diagnosis present

## 2017-01-10 DIAGNOSIS — S01111A Laceration without foreign body of right eyelid and periocular area, initial encounter: Secondary | ICD-10-CM | POA: Diagnosis present

## 2017-01-10 DIAGNOSIS — F1110 Opioid abuse, uncomplicated: Secondary | ICD-10-CM | POA: Diagnosis present

## 2017-01-10 DIAGNOSIS — R55 Syncope and collapse: Secondary | ICD-10-CM | POA: Diagnosis present

## 2017-01-10 DIAGNOSIS — G894 Chronic pain syndrome: Secondary | ICD-10-CM | POA: Diagnosis present

## 2017-01-10 DIAGNOSIS — I471 Supraventricular tachycardia: Secondary | ICD-10-CM | POA: Diagnosis present

## 2017-01-10 DIAGNOSIS — B182 Chronic viral hepatitis C: Secondary | ICD-10-CM | POA: Diagnosis present

## 2017-01-10 DIAGNOSIS — I4430 Unspecified atrioventricular block: Secondary | ICD-10-CM | POA: Diagnosis present

## 2017-01-10 DIAGNOSIS — Z538 Procedure and treatment not carried out for other reasons: Secondary | ICD-10-CM | POA: Diagnosis not present

## 2017-01-10 DIAGNOSIS — I340 Nonrheumatic mitral (valve) insufficiency: Secondary | ICD-10-CM | POA: Diagnosis present

## 2017-01-10 DIAGNOSIS — G609 Hereditary and idiopathic neuropathy, unspecified: Secondary | ICD-10-CM | POA: Diagnosis present

## 2017-01-10 DIAGNOSIS — Z8249 Family history of ischemic heart disease and other diseases of the circulatory system: Secondary | ICD-10-CM

## 2017-01-10 DIAGNOSIS — Y92009 Unspecified place in unspecified non-institutional (private) residence as the place of occurrence of the external cause: Secondary | ICD-10-CM

## 2017-01-10 DIAGNOSIS — R296 Repeated falls: Secondary | ICD-10-CM | POA: Diagnosis present

## 2017-01-10 DIAGNOSIS — Z888 Allergy status to other drugs, medicaments and biological substances status: Secondary | ICD-10-CM

## 2017-01-10 DIAGNOSIS — I440 Atrioventricular block, first degree: Secondary | ICD-10-CM | POA: Diagnosis present

## 2017-01-10 DIAGNOSIS — Z79899 Other long term (current) drug therapy: Secondary | ICD-10-CM

## 2017-01-10 DIAGNOSIS — F1020 Alcohol dependence, uncomplicated: Secondary | ICD-10-CM | POA: Diagnosis present

## 2017-01-10 DIAGNOSIS — F0391 Unspecified dementia with behavioral disturbance: Secondary | ICD-10-CM | POA: Diagnosis present

## 2017-01-10 DIAGNOSIS — F319 Bipolar disorder, unspecified: Secondary | ICD-10-CM | POA: Diagnosis present

## 2017-01-10 DIAGNOSIS — Z23 Encounter for immunization: Secondary | ICD-10-CM

## 2017-01-10 DIAGNOSIS — I119 Hypertensive heart disease without heart failure: Secondary | ICD-10-CM | POA: Diagnosis present

## 2017-01-10 DIAGNOSIS — W1830XA Fall on same level, unspecified, initial encounter: Secondary | ICD-10-CM | POA: Diagnosis present

## 2017-01-10 DIAGNOSIS — G40419 Other generalized epilepsy and epileptic syndromes, intractable, without status epilepticus: Secondary | ICD-10-CM | POA: Diagnosis present

## 2017-01-10 DIAGNOSIS — R569 Unspecified convulsions: Secondary | ICD-10-CM

## 2017-01-10 DIAGNOSIS — I200 Unstable angina: Secondary | ICD-10-CM | POA: Diagnosis present

## 2017-01-10 LAB — COMPREHENSIVE METABOLIC PANEL
ALT: 16 U/L — ABNORMAL LOW (ref 17–63)
ANION GAP: 10 (ref 5–15)
AST: 25 U/L (ref 15–41)
Albumin: 3.2 g/dL — ABNORMAL LOW (ref 3.5–5.0)
Alkaline Phosphatase: 71 U/L (ref 38–126)
BUN: 14 mg/dL (ref 6–20)
CHLORIDE: 107 mmol/L (ref 101–111)
CO2: 20 mmol/L — AB (ref 22–32)
Calcium: 8.6 mg/dL — ABNORMAL LOW (ref 8.9–10.3)
Creatinine, Ser: 0.97 mg/dL (ref 0.61–1.24)
GFR calc Af Amer: >60 mL/min (ref >60)
Glucose, Bld: 125 mg/dL — ABNORMAL HIGH (ref 65–99)
POTASSIUM: 4 mmol/L (ref 3.5–5.1)
SODIUM: 137 mmol/L (ref 135–145)
Total Bilirubin: 0.8 mg/dL (ref 0.3–1.2)
Total Protein: 7.8 g/dL (ref 6.5–8.1)

## 2017-01-10 LAB — CBC WITH DIFFERENTIAL/PLATELET
BASOS PCT: 0 %
Basophils Absolute: 0 10*3/uL (ref 0.0–0.1)
EOS ABS: 0.1 10*3/uL (ref 0.0–0.7)
EOS PCT: 1 %
HCT: 41.3 % (ref 39.0–52.0)
HEMOGLOBIN: 14.5 g/dL (ref 13.0–17.0)
LYMPHS PCT: 46 %
Lymphs Abs: 6 10*3/uL — ABNORMAL HIGH (ref 0.7–4.0)
MCH: 34.4 pg — AB (ref 26.0–34.0)
MCHC: 35.1 g/dL (ref 30.0–36.0)
MCV: 97.9 fL (ref 78.0–100.0)
Monocytes Absolute: 1.2 10*3/uL — ABNORMAL HIGH (ref 0.1–1.0)
Monocytes Relative: 9 %
NEUTROS PCT: 44 %
Neutro Abs: 5.8 10*3/uL (ref 1.7–7.7)
Platelets: 300 10*3/uL (ref 150–400)
RBC: 4.22 MIL/uL (ref 4.22–5.81)
RDW: 12.2 % (ref 11.5–15.5)
WBC: 13.1 10*3/uL — AB (ref 4.0–10.5)

## 2017-01-10 LAB — MRSA PCR SCREENING: MRSA by PCR: NEGATIVE

## 2017-01-10 LAB — I-STAT CG4 LACTIC ACID, ED: LACTIC ACID, VENOUS: 2.28 mmol/L — AB (ref 0.5–1.9)

## 2017-01-10 LAB — TROPONIN I
Troponin I: <0.03 ng/mL (ref <0.03)
Troponin I: <0.03 ng/mL (ref <0.03)

## 2017-01-10 LAB — I-STAT TROPONIN, ED: TROPONIN I, POC: 0.03 ng/mL (ref 0.00–0.08)

## 2017-01-10 LAB — TSH: TSH: 1.506 u[IU]/mL (ref 0.350–4.500)

## 2017-01-10 MED ORDER — LEVETIRACETAM 750 MG PO TABS
750.0000 mg | ORAL_TABLET | Freq: Two times a day (BID) | ORAL | Status: DC
  Administered 2017-01-10 – 2017-01-14 (×9): 750 mg via ORAL
  Filled 2017-01-10 (×9): qty 1

## 2017-01-10 MED ORDER — MELATONIN 3 MG PO TABS
3.0000 mg | ORAL_TABLET | Freq: Every day | ORAL | Status: DC
  Administered 2017-01-10 – 2017-01-19 (×10): 3 mg via ORAL
  Filled 2017-01-10 (×11): qty 1

## 2017-01-10 MED ORDER — ASPIRIN EC 81 MG PO TBEC
81.0000 mg | DELAYED_RELEASE_TABLET | Freq: Every day | ORAL | Status: DC
  Administered 2017-01-11 – 2017-01-12 (×2): 81 mg via ORAL
  Filled 2017-01-10 (×2): qty 1

## 2017-01-10 MED ORDER — SODIUM CHLORIDE 0.9 % IV SOLN: 250.0000 mL | INTRAVENOUS | Status: DC | PRN

## 2017-01-10 MED ORDER — TETANUS-DIPHTH-ACELL PERTUSSIS 5-2.5-18.5 LF-MCG/0.5 IM SUSP
0.5000 mL | Freq: Once | INTRAMUSCULAR | Status: AC
  Administered 2017-01-10: 0.5 mL via INTRAMUSCULAR
  Filled 2017-01-10: qty 0.5

## 2017-01-10 MED ORDER — SERTRALINE HCL 100 MG PO TABS
100.0000 mg | ORAL_TABLET | Freq: Every day | ORAL | Status: DC
  Administered 2017-01-10 – 2017-01-20 (×11): 100 mg via ORAL
  Filled 2017-01-10 (×11): qty 1

## 2017-01-10 MED ORDER — VITAMIN D 1000 UNITS PO TABS
1000.0000 [IU] | ORAL_TABLET | Freq: Every day | ORAL | Status: DC
  Administered 2017-01-10 – 2017-01-20 (×11): 1000 [IU] via ORAL
  Filled 2017-01-10 (×12): qty 1

## 2017-01-10 MED ORDER — SODIUM CHLORIDE 0.9% FLUSH: 3.0000 mL | INTRAVENOUS | Status: DC | PRN

## 2017-01-10 MED ORDER — SODIUM CHLORIDE 0.9% FLUSH
3.0000 mL | Freq: Two times a day (BID) | INTRAVENOUS | Status: DC
  Administered 2017-01-10 – 2017-01-12 (×3): 3 mL via INTRAVENOUS

## 2017-01-10 MED ORDER — LIDOCAINE HCL (PF) 1 % IJ SOLN
5.0000 mL | Freq: Once | INTRAMUSCULAR | Status: AC
  Administered 2017-01-10: 5 mL via INTRADERMAL
  Filled 2017-01-10: qty 5

## 2017-01-10 MED ORDER — SODIUM CHLORIDE 0.9 % IV BOLUS (SEPSIS)
1000.0000 mL | Freq: Once | INTRAVENOUS | Status: AC
  Administered 2017-01-10: 1000 mL via INTRAVENOUS

## 2017-01-10 MED ORDER — DILTIAZEM LOAD VIA INFUSION
5.0000 mg | Freq: Once | INTRAVENOUS | Status: AC
  Administered 2017-01-10: 5 mg via INTRAVENOUS
  Filled 2017-01-10: qty 5

## 2017-01-10 MED ORDER — ZOLPIDEM TARTRATE 5 MG PO TABS
10.0000 mg | ORAL_TABLET | Freq: Every day | ORAL | Status: DC
  Administered 2017-01-10 – 2017-01-19 (×10): 10 mg via ORAL
  Filled 2017-01-10 (×10): qty 2

## 2017-01-10 MED ORDER — ADENOSINE 6 MG/2ML IV SOLN
INTRAVENOUS | Status: AC
  Administered 2017-01-10: 12 mg
  Filled 2017-01-10: qty 4

## 2017-01-10 MED ORDER — QUETIAPINE FUMARATE 50 MG PO TABS
50.0000 mg | ORAL_TABLET | Freq: Every day | ORAL | Status: DC
  Administered 2017-01-10 – 2017-01-20 (×11): 50 mg via ORAL
  Filled 2017-01-10 (×11): qty 1

## 2017-01-10 MED ORDER — DILTIAZEM HCL 60 MG PO TABS
60.0000 mg | ORAL_TABLET | Freq: Three times a day (TID) | ORAL | Status: DC
  Administered 2017-01-10 (×2): 60 mg via ORAL
  Filled 2017-01-10 (×3): qty 1

## 2017-01-10 MED ORDER — DILTIAZEM HCL 100 MG IV SOLR
5.0000 mg/h | INTRAVENOUS | Status: DC
  Administered 2017-01-10: 5 mg/h via INTRAVENOUS
  Administered 2017-01-11 – 2017-01-12 (×3): 15 mg/h via INTRAVENOUS
  Filled 2017-01-10 (×6): qty 100

## 2017-01-10 MED ORDER — ACETAMINOPHEN 500 MG PO TABS
1000.0000 mg | ORAL_TABLET | Freq: Two times a day (BID) | ORAL | Status: DC
  Administered 2017-01-10 – 2017-01-14 (×9): 1000 mg via ORAL
  Filled 2017-01-10 (×9): qty 2

## 2017-01-10 NOTE — ED Notes (Addendum)
Pt having sustained episode of RVR @ rate of 200 bpm. BP systolic 80's. Pt remains a/o x4. MD paged and advised to give oral loading dose and titrate drip to next increment regardless of time lapsed since last titration.

## 2017-01-10 NOTE — ED Notes (Signed)
After 5 mg bolus diltiazem, patient rate down to 100, BP up to 113/85, patient reports resolution of chest pain.

## 2017-01-10 NOTE — H&P (Signed)
History and Physical    [name] [name] Rovira ZOX:[id number] DOB: 07-07-1960 DOA: 01/10/2017  PCP: Mortimer Friesurl, Ramesh Moan, PA  Patient coming from:  Group home  Chief Complaint:   Passed out  HPI: [name] [name] is a 56 y.o. male with medical history significant of dementia, seizure disorder, hep c, htn comes in with syncopal episode at his group home  Pt reports he "just fell out".  He is a very poor historian.  Denies any fevers.  No recent illnesses.  Earlier he was having a lot of chest pressure and his heart rate was 200 when ems arrived.  He was given adenosine twice with no results.  In ED given cardizem bolus and drip and rate dropped to 110 range and was actually aflutter which is new for pt.  Pt feels much better now that rate is down and his chest pressure is resolved.     Review of Systems: As per HPI otherwise 10 point review of systems negative.   Past Medical History:  Diagnosis Date  . Aneurysm (HCC)   . ETOHism (HCC)   . Gait abnormality 05/26/2016  . Hepatitis C   . 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 Hypertension   . Seizure [facility](HCC)     Past Surgical History:  Procedure Laterality Date  . CEREBRAL AN[facility]M REPAIR     [facility]  . HARDWARE REMOVAL Left 10/29/2012   Procedure: HARDWARE REMOVAL;  Surgeon: Sheral Apleyimothy D Murphy, MD;  [facility];  Service: Orthopedics;  Laterality: Left;  . I&D EXTREMITY Left 10/26/2012   Procedure: IRRIGATION AND DEBRIDEMENT Left Elbow;  Surgeon: Sheral Apleyimothy D Murphy, MD;  Location: [facility] OR;  Service: Orthopedics;  Laterality: Left;  . I&D EXTREMITY Left 10/29/2012   Procedure: IRRIGATION AND DEBRIDEMENT EXTREMITY, wound vac change, stimulan beads;  Surgeon: Sheral Apleyimothy D Murphy, MD;  Location: [facility] OR;  Service: Orthopedics;  Laterality: Left;  lateral on bean bag  . I&D EXTREMITY Left 11/02/2012   Procedure: IRRIGATION AND DEBRIDEMENT EXTREMITY;  Surgeon: Sheral Apleyimothy D Murphy, MD;  Location: [facility] OR;  Service: Orthopedics;  Laterality:  Left;  . INCISION AND DRAINAGE ABSCESS Left 11/02/2012   Procedure: INCISION AND DRAINAGE ABSCESS;  Surgeon: Sheral Apleyimothy D Murphy, MD;  Location: [facility] OR;  Service: Orthopedics;  Laterality: Left;     reports that he has quit smoking. His smoking use included Cigarettes. He smoked 0.25 packs per day. He has never used smokeless tobacco. He reports that he does not drink alcohol or use drugs.  Allergies  Allergen Reactions  . Carbamazepine Other (See Comments)    Hyponatremia  . Depakote [Divalproex Sodium] Other (See Comments)    Unknown - on MAR    Family History  Problem Relation Age of Onset  . Hypertension Mother   . Cancer Mother   . Hypertension Father   . Diabetes Sister   . Cancer Sister   . Cancer Sister     Prior to Admission medications   Medication Sig Start Date End Date Taking? Authorizing Provider  acetaminophen (TYLENOL) 500 MG tablet Take 1,000 mg by mouth 2 (two) times daily.   Yes [provider]  cephALEXin (KEFLEX) 500 MG capsule Take 500 mg by mouth 3 (three) times daily. For seven days   Yes [provider]  Cholecalciferol (VITAMIN D3) 1000 units CAPS Take 1,000 Units by mouth daily.   Yes [provider]  Lacosamide (VIMPAT) 100 MG TABS Take 1 tablet (100 mg total) by mouth 2 (two) times daily.  Patient taking differently: Take 100 mg by mouth 3 (three) times daily.  07/11/16  Yes York Spaniel, MD  levETIRAcetam (KEPPRA) 750 MG tablet Take 2 tablets (1,500 mg total) by mouth in the morning and in the evening daily. 04/14/16  Yes Butch Penny, NP  Melatonin 3 MG TABS Take 3 mg by mouth at bedtime.   Yes [provider]  metoprolol tartrate (LOPRESSOR) 25 MG tablet TAKE 1 TABLET BY MOUTH TWICE DAILY Patient taking differently: TAKE 1 TABLET (25mg ) BY MOUTH TWICE DAILY 07/12/16  Yes Jegede, Olugbemiga E, MD  QUEtiapine (SEROQUEL) 50 MG tablet Take 50 mg by mouth daily.  04/11/15  Yes [provider]  sertraline  (ZOLOFT) 100 MG tablet Take 100 mg by mouth daily.   Yes [provider]  zolpidem (AMBIEN) 10 MG tablet Take 10 mg by mouth at bedtime.    Yes [provider]  diclofenac sodium (VOLTAREN) 1 % GEL Apply 4 g topically 4 (four) times daily. To elbow. Please instruct in dosing. Patient not taking: Reported on 01/10/2017 02/11/15   Linna Hoff, MD    Physical Exam: Vitals:   01/10/17 1000 01/10/17 1030 01/10/17 1045 01/10/17 1115  BP: 116/83 134/81 120/69 113/85  Pulse: (!) 105 (!) 105 (!) 105 (!) 103  Resp: 20 (!) 24 17 (!) 22  SpO2: 99% 100% 96% 98%      Constitutional: NAD, calm, comfortable Vitals:   01/10/17 1000 01/10/17 1030 01/10/17 1045 01/10/17 1115  BP: 116/83 134/81 120/69 113/85  Pulse: (!) 105 (!) 105 (!) 105 (!) 103  Resp: 20 (!) 24 17 (!) 22  SpO2: 99% 100% 96% 98%   Eyes: PERRL, lids and conjunctivae normal ENMT: Mucous membranes are moist. Posterior pharynx clear of any exudate or lesions.Normal dentition.  Neck: normal, supple, no masses, no thyromegaly Respiratory: clear to auscultation bilaterally, no wheezing, no crackles. Normal respiratory effort. No accessory muscle use.  Cardiovascular: Regular rate and rhythm, no murmurs / rubs / gallops. No extremity edema. 2+ pedal pulses. No carotid bruits.  Abdomen: no tenderness, no masses palpated. No hepatosplenomegaly. Bowel sounds positive.  Musculoskeletal: no clubbing / cyanosis. No joint deformity upper and lower extremities. Good ROM, no contractures. Normal muscle tone.  Skin: no rashes, lesions, ulcers. No induration Neurologic: CN 2-12 grossly intact. Sensation intact, DTR normal. Strength 5/5 in all 4.  Psychiatric: Normal judgment and insight. Alert and oriented x 3. Normal mood.    Labs on Admission: I have personally reviewed following labs and imaging studies  CBC:  Recent Labs Lab 01/10/17 0747  WBC 13.1*  NEUTROABS 5.8  HGB 14.5  HCT 41.3  MCV 97.9  PLT 300   Basic  Metabolic Panel:  Recent Labs Lab 01/10/17 0747  NA 137  K 4.0  CL 107  CO2 20*  GLUCOSE 125*  BUN 14  CREATININE 0.97  CALCIUM 8.6*   GFR: CrCl cannot be calculated (Unknown ideal weight.). Liver Function Tests:  Recent Labs Lab 01/10/17 0747  AST 25  ALT 16*  ALKPHOS 71  BILITOT 0.8  PROT 7.8  ALBUMIN 3.2*    Radiological Exams on Admission: Ct Head Wo Contrast  Result Date: 01/10/2017 CLINICAL DATA:  Pain following fall EXAM: CT HEAD WITHOUT CONTRAST CT CERVICAL SPINE WITHOUT CONTRAST TECHNIQUE: Multidetector CT imaging of the head and cervical spine was performed following the standard protocol without intravenous contrast. Multiplanar CT image reconstructions of the cervical spine were also generated. COMPARISON:  Brain MRI November 03, 2014; head CT and cervical spine CT October 31, 2014 FINDINGS: CT HEAD FINDINGS Brain: Moderate diffuse atrophy is stable. There is no intracranial mass, hemorrhage, extra-axial fluid collection, or midline shift. 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 Elsewhere there is patchy small vessel disease in the centra semiovale bilaterally. No new gray-white compartment lesions evident. No evidence of acute infarct. Air is noted to the right of the sella, chronic and stable from the 2016 study. Vascular: There is no hyperdense vessel. There is calcification in the carotid siphon regions. Skull: Bony calvarium appears intact. Sinuses/Orbits: Old nasal fractures appear stable. There is evidence of old trauma involving the medial left orbital wall, stable. There is mucosal thickening in both maxillary antra as well as in multiple ethmoid air cells. Other visualized paranasal sinuses are clear. No intraorbital lesions are evident. Other: Mastoid air cells are clear. CT CERVICAL SPINE FINDINGS Alignment: There is no appreciable spondylolisthesis. Skull base and vertebrae: Skull base and craniocervical junction  regions appear normal. There is no acute fracture. No blastic or lytic bone lesions evident. Soft tissues and spinal canal: Prevertebral soft tissues and predental space regions are normal. There are no paraspinous lesions. There is no evident cord or canal hematoma. Disc levels: There is moderate disc space narrowing at C4-5. There is somewhat more severe disc space narrowing at C5-6, C6-7, and C7-T1. There are prominent anterior osteophytes at C4, C5, C6, and to a slightly lesser extent at C7. There is facet hypertrophy at multiple levels. There is exit foraminal narrowing due to bony hypertrophy on the left at C3-4, at C4-5 bilaterally, and at C6-7 bilaterally. No frank disc extrusion or stenosis. Upper chest: Visualized upper lung zones are clear. Other: There are foci of calcification in both subclavian arteries as well as in both carotid arteries. IMPRESSION: CT head: Stable moderate diffuse atrophy, slightly more pronounced involving the cerebellum than elsewhere. 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 No acute infarct. There is patchy periventricular small vessel disease. No mass, hemorrhage, or extra-axial fluid collection. Areas present to the right of the sella, chronic and stable. There appears to be a connection between the posterior sphenoid sinus in this area, possibly residua of old trauma. There are areas of prior nasal trauma and trauma involving the medial left orbital wall. There are foci of paranasal sinus disease currently. There are foci of arterial vascular calcification. CT cervical spine: No fracture or spondylolisthesis. Multilevel osteoarthritic change present. There are foci of calcification in both carotid and subclavian arteries. Electronically Signed   By: Bretta Bang III M.D.   On: 01/10/2017 10:54   Ct Cervical Spine Wo Contrast  Result Date: 01/10/2017 CLINICAL DATA:  Pain following fall EXAM: CT HEAD WITHOUT CONTRAST CT CERVICAL  SPINE WITHOUT CONTRAST TECHNIQUE: Multidetector CT imaging of the head and cervical spine was performed following the standard protocol without intravenous contrast. Multiplanar CT image reconstructions of the cervical spine were also generated. COMPARISON:  Brain MRI November 03, 2014; head CT and cervical spine CT October 31, 2014 FINDINGS: CT HEAD FINDINGS Brain: Moderate diffuse atrophy is stable. There is no intracranial mass, hemorrhage, extra-axial fluid collection, or midline shift. 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 Elsewhere there is patchy small vessel disease in the centra semiovale bilaterally. No new gray-white compartment lesions evident. No evidence of acute infarct. Air is  noted to the right of the sella, chronic and stable from the 2016 study. Vascular: There is no hyperdense vessel. There is calcification in the carotid siphon regions. Skull: Bony calvarium appears intact. Sinuses/Orbits: Old nasal fractures appear stable. There is evidence of old trauma involving the medial left orbital wall, stable. There is mucosal thickening in both maxillary antra as well as in multiple ethmoid air cells. Other visualized paranasal sinuses are clear. No intraorbital lesions are evident. Other: Mastoid air cells are clear. CT CERVICAL SPINE FINDINGS Alignment: There is no appreciable spondylolisthesis. Skull base and vertebrae: Skull base and craniocervical junction regions appear normal. There is no acute fracture. No blastic or lytic bone lesions evident. Soft tissues and spinal canal: Prevertebral soft tissues and predental space regions are normal. There are no paraspinous lesions. There is no evident cord or canal hematoma. Disc levels: There is moderate disc space narrowing at C4-5. There is somewhat more severe disc space narrowing at C5-6, C6-7, and C7-T1. There are prominent anterior osteophytes at C4, C5, C6, and to a slightly lesser extent at C7.  There is facet hypertrophy at multiple levels. There is exit foraminal narrowing due to bony hypertrophy on the left at C3-4, at C4-5 bilaterally, and at C6-7 bilaterally. No frank disc extrusion or stenosis. Upper chest: Visualized upper lung zones are clear. Other: There are foci of calcification in both subclavian arteries as well as in both carotid arteries. IMPRESSION: CT head: Stable moderate diffuse atrophy, slightly more pronounced involving the cerebellum than elsewhere. 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 No acute infarct. There is patchy periventricular small vessel disease. No mass, hemorrhage, or extra-axial fluid collection. Areas present to the right of the sella, chronic and stable. There appears to be a connection between the posterior sphenoid sinus in this area, possibly residua of old trauma. There are areas of prior nasal trauma and trauma involving the medial left orbital wall. There are foci of paranasal sinus disease currently. There are foci of arterial vascular calcification. CT cervical spine: No fracture or spondylolisthesis. Multilevel osteoarthritic change present. There are foci of calcification in both carotid and subclavian arteries. Electronically Signed   By: Bretta Bang III M.D.   On: 01/10/2017 10:54   Dg Chest Portable 1 View  Result Date: 01/10/2017 CLINICAL DATA:  Pain following fall.  Supraventricular tachycardia EXAM: PORTABLE CHEST 1 VIEW COMPARISON:  November 04, 2014 FINDINGS: There is no edema or consolidation. Heart is mildly enlarged with pulmonary vascularity within normal limits. No adenopathy. There is aortic atherosclerosis. No pneumothorax. There is evidence of old rib trauma on the right, unchanged. IMPRESSION: Stable cardiac prominence. Aortic atherosclerosis. No edema or consolidation. Aortic Atherosclerosis (ICD10-I70.0). Electronically Signed   By: Bretta Bang III M.D.   On: 01/10/2017 09:16    EKG:  Independently reviewed.  Aflutter with rvr Old chart reviewed Case discussed with edp Tele monitoring in ed now sinus tachy with rate less than 110  Assessment/Plan 56 yo male with syncopal episode from aflutter with rapid ventricular response  Principal Problem:   Atrial flutter with rapid ventricular response (HCC)- responding to diltiazem drip.  Load orally with diltiazem and wean drip as HR and BP tolerates.  Ck cardiac echo.  Active Problems:   Syncope- due to above.  Has h/o seizures, doubt sz place on sz precautions   Chronic liver disease- stable   Chronic hepatitis C without hepatic coma (HCC)-stable   Convulsions/seizures (HCC)- cont keppra  S/P cerebral aneurysm repair- stable, ct neg   Dementia with behavioral disturbance- stable, mild   Chronic pain syndrome- stable    DVT prophylaxis:  scds Code Status:  full Family Communication:  none Disposition Plan:  Per day team Consults called:  none Admission status:  observation   Sang Blount A MD [facility]  If 7PM-7AM, please contact night-coverage [email or web address] Password [facility]  01/10/2017, 12:23 PM

## 2017-01-10 NOTE — ED Triage Notes (Signed)
[facility] a fall with laceration to right forehead, on EMS arrival found to be in SVT at a rate of 200 bpm, BP 80's, a/o x4, verbally aggressive on arrival, complaining of chest pressure. EMS has given a total of 18 mg adenosine without any change. 324 aspirin given as well.

## 2017-01-10 NOTE — Progress Notes (Signed)
Pt. Demanding different diet. MD notified and pt will remain on cardiac diet.

## 2017-01-10 NOTE — ED Notes (Signed)
Patient is stable and ready to be transport to the floor at this time.  Report was called to 2C RN.  Belongings taken with the patient to the floor.   

## 2017-01-10 NOTE — ED Provider Notes (Signed)
[name] - [place] EMERGENCY DEPARTMENT Provider Note   CSN: [id number] Arrival date & time: 01/10/17  0865     History   Chief Complaint Chief Complaint  Patient presents with  . Tachycardia    HPI [name] is a 56 y.o. male.  HPI   Patient is a 56 year old male with past medical history significant for seizures after bringing years repair, alcohol abuse, peripheral neuropathy, hepatitis C, hypertension. He is presenting today with elevated heart rate. Patient had dizziness and falli n his group home EMS found him to have heart rate above 200. Given 6 and then 12 of adenosine prior to arrival.  Past Medical History:  Diagnosis Date  . Aneurysm (HCC)   . ETOHism (HCC)   . Gait abnormality 05/26/2016  . Hepatitis C   . 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 Hypertension   . Seizure [facility])     Patient Active Problem List   Diagnosis Date Noted  . Gait abnormality 05/26/2016  . Smoking 01/05/2015  . Chronic pain syndrome 01/05/2015  . Elbow laceration   . Bacteremia   . Fall   . MRSA bacteremia   . Paranoia (HCC)   . Dementia with behavioral disturbance   . Seizure (HCC) 10/31/2014  . Staphylococcus aureus bacteremia without sepsis 10/18/2014  . Altered mental status   . Nonunion fracture of left ulna   . Left elbow pain 10/13/2014  . Ulnar fracture 10/13/2014  . Encephalopathy, hepatic (HCC) 10/09/2014  . Tobacco dependency 10/08/2014  . History of rib fracture 10/08/2014  . 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 Seizures (HCC) 01/24/2014  . Bilateral leg weakness 01/23/2014  . 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/23/2014  . Hyponatremia 11/01/2012  . Convulsions/seizures (HCC) 10/31/2012  . Essential hypertension, benign 10/31/2012  . Septic joint of left elbow (HCC) 10/26/2012  . History of alcohol abuse 10/26/2012  . Chronic liver disease 10/26/2012  . Thrombocytopenia (HCC) 10/26/2012  .  Chronic hepatitis C without hepatic coma (HCC) 10/26/2012    Past Surgical History:  Procedure Laterality Date  . CEREBRAL ANEURYSM REPAIR     At [facility]  . HARDWARE REMOVAL Left 10/29/2012   Procedure: HARDWARE REMOVAL;  Surgeon: Sheral Apley, MD;  Location: [facility] OR;  Service: Orthopedics;  Laterality: Left;  . I&D EXTREMITY Left 10/26/2012   Procedure: IRRIGATION AND DEBRIDEMENT Left Elbow;  Surgeon: Sheral Apley, MD;  Location: [facility] OR;  Service: Orthopedics;  Laterality: Left;  . I&D EXTREMITY Left 10/29/2012   Procedure: IRRIGATION AND DEBRIDEMENT EXTREMITY, wound vac change, stimulan beads;  Surgeon: Sheral Apley, MD;  Location: [facility] OR;  Service: Orthopedics;  Laterality: Left;  lateral on bean bag  . I&D EXTREMITY Left 11/02/2012   Procedure: IRRIGATION AND DEBRIDEMENT EXTREMITY;  Surgeon: Sheral Apley, MD;  Location: [facility] OR;  Service: Orthopedics;  Laterality: Left;  . INCISION AND DRAINAGE ABSCESS Left 11/02/2012   Procedure: INCISION AND DRAINAGE ABSCESS;  Surgeon: Sheral Apley, MD;  Location: [facility] OR;  Service: Orthopedics;  Laterality: Left;       Home Medications    Prior to Admission medications   Medication Sig Start Date End Date Taking? Authorizing Provider  diclofenac sodium (VOLTAREN) 1 % GEL Apply 4 g topically 4 (four) times daily. To elbow. Please instruct in dosing. 02/11/15   Linna Hoff, MD  Lacosamide (VIMPAT) 100 MG TABS Take 1 tablet (100 mg total) by mouth 2 (two) times daily. 07/11/16  York SpanielWillis, [name], MD  levETIRAcetam (KEPPRA) 750 MG tablet Take 2 tablets (1,500 mg total) by mouth in the morning and in the evening daily. 04/14/16   Butch PennyMillikan, Megan, NP  metoprolol tartrate (LOPRESSOR) 25 MG tablet TAKE 1 TABLET BY MOUTH TWICE DAILY 07/12/16   Quentin AngstJegede, Olugbemiga E, MD  QUEtiapine (SEROQUEL) 50 MG tablet Take 50 mg by mouth daily.  04/11/15   [provider]  sertraline (ZOLOFT) 100 MG tablet Take 100 mg by mouth daily.    [provider]  Vitamin D, Ergocalciferol, (DRISDOL) 50000 units CAPS capsule Take 1 capsule (50,000 Units total) by mouth every 7 (seven) days. 10/26/15   Vivianne MasterNoel, Tiffany S, PA-C  zolpidem (AMBIEN) 5 MG tablet Take 5 mg by mouth at bedtime.    [provider]    Family History Family History  Problem Relation Age of Onset  . Hypertension Mother   . Cancer Mother   . Hypertension Father   . Diabetes Sister   . Cancer Sister   . Cancer Sister     Social History Social History  Substance Use Topics  . Smoking status: Former Smoker    Packs/day: 0.25    Types: Cigarettes  . Smokeless tobacco: Never Used  . Alcohol use No     Comment: quit drinking 11/2011 per patient     Allergies   Carbamazepine   Review of Systems Review of Systems  Constitutional: Negative for activity change, fatigue and fever.  Respiratory: Negative for shortness of breath.   Cardiovascular: Positive for chest pain.  Gastrointestinal: Negative for abdominal pain.     Physical Exam Updated Vital Signs There were no vitals taken for this visit.  Physical Exam  Constitutional: He is oriented to person, place, and time. He appears well-nourished.  HENT:  Head: Normocephalic.  Laceration to right eyebrow.  Eyes: Conjunctivae are normal. Right eye exhibits no discharge. Left eye exhibits no discharge.  Cardiovascular: Normal rate and regular rhythm.   No murmur heard. Pulmonary/Chest: Effort normal and breath sounds normal. No respiratory distress.  Abdominal: Soft. He exhibits no distension.  Neurological: He is oriented to person, place, and time. No cranial nerve deficit.  Patient loud, chatty, appears baseline.  Skin: Skin is warm and dry. He is not diaphoretic.  Psychiatric: He has a normal mood and affect. His behavior is normal.     ED Treatments / Results  Labs (all labs ordered are listed, but only abnormal results are displayed) Labs Reviewed  I-STAT CG4 LACTIC ACID, ED -  Abnormal; Notable for the following:       Result Value   Lactic Acid, Venous 2.28 (*)    All other components within normal limits  COMPREHENSIVE METABOLIC PANEL  CBC WITH DIFFERENTIAL/PLATELET  I-STAT TROPONIN, ED    EKG  EKG Interpretation  Date/Time:  Tuesday January 10 2017 09:05:33 EDT Ventricular Rate:  104 PR Interval:    QRS Duration: 87 QT Interval:  362 QTC Calculation: 477 R Axis:   -15 Text Interpretation:  Since last tracing rate slower Confirmed by Corlis LeakMackuen, Sonoma Firkus ([id number]) on 01/10/2017 11:17:27 AM       Radiology No results found.  Procedures Procedures (including critical care time)  CRITICAL CARE Performed by: Arlana Hoveourteney L Kain Milosevic Total critical care time 45 minutes Critical care time was exclusive of separately billable procedures and treating other patients. Critical care was necessary to treat or prevent imminent or life-threatening deterioration. Critical care was time spent personally by me on the  following activities: development of treatment plan with patient and/or surrogate as well as nursing, discussions with consultants, evaluation of patient's response to treatment, examination of patient, obtaining history from patient or surrogate, ordering and performing treatments and interventions, ordering and review of laboratory studies, ordering and review of radiographic studies, pulse oximetry and re-evaluation of patient's condition.  LACERATION REPAIR Performed by: Arlana Hove Authorized by: Arlana Hove Consent: Verbal consent obtained. Risks and benefits: risks, benefits and alternatives were discussed Consent given by: patient Patient identity confirmed: provided demographic data Prepped and Draped in normal sterile fashion Wound explored  Laceration Location: 6 cm R eyebrwo  No Foreign Bodies seen or palpated  Anesthesia: local infiltration  Local anesthetic: lidocaine 1 w epinephrine  Anesthetic total: 8  ml  Irrigation method: syringe Amount of cleaning: standard  Skin closure: interupted simple 4-0 ethilon  Patient tolerance: Patient tolerated the procedure well with no immediate complications.   Medications Ordered in ED Medications  diltiazem (CARDIZEM) 1 mg/mL load via infusion 5 mg (5 mg Intravenous Bolus from Bag 01/10/17 0901)    And  diltiazem (CARDIZEM) 100 mg in dextrose 5 % 100 mL (1 mg/mL) infusion (5 mg/hr Intravenous New Bag/Given 01/10/17 0902)  adenosine (ADENOCARD) 6 MG/2ML injection (not administered)  sodium chloride 0.9 % bolus 1,000 mL (1,000 mLs Intravenous New Bag/Given 01/10/17 0906)  lidocaine (PF) (XYLOCAINE) 1 % injection 5 mL (5 mLs Intradermal Given 01/10/17 0915)  Tdap (BOOSTRIX) injection 0.5 mL (0.5 mLs Intramuscular Given 01/10/17 0915)     Initial Impression / Assessment and Plan / ED Course  I have reviewed the triage vital signs and the nursing notes.  Pertinent labs & imaging results that were available during my care of the patient were reviewed by me and considered in my medical decision making (see chart for details).    Patient is a 56 year old male with past medical history significant for seizures after bringing years repair, alcohol abuse, peripheral neuropathy, hepatitis C, hypertension. He is presenting today with elevated heart rate. Patient had dizziness and falli n his group home EMS found him to have heart rate above 200. Given 6 and then 12 of adenosine prior to arrival.  9:22 AM Give additional 12 mg of adenosine. After prolonged wait, patient did have slowing of heart rate and then it sped right back up again. Rate of by cardiology, likely to be flutter with 1:1 block. Patient started on diltiazem and immediately slowed down.patient's chest pain resolved after decrease in heart rate.  Given the patient has history of aneurysm, we'll get CT head.   11:18 AM Laceration reparied. Will admit on dilt drip, serial trops.    Final  Clinical Impressions(s) / ED Diagnoses   Final diagnoses:  None    New Prescriptions New Prescriptions   No medications on file     Abelino Derrick, MD 01/10/17 1118

## 2017-01-11 ENCOUNTER — Observation Stay (HOSPITAL_BASED_OUTPATIENT_CLINIC_OR_DEPARTMENT_OTHER): Payer: Medicaid Other

## 2017-01-11 DIAGNOSIS — R55 Syncope and collapse: Secondary | ICD-10-CM | POA: Diagnosis not present

## 2017-01-11 DIAGNOSIS — I729 Aneurysm of unspecified site: Secondary | ICD-10-CM

## 2017-01-11 DIAGNOSIS — I119 Hypertensive heart disease without heart failure: Secondary | ICD-10-CM | POA: Diagnosis present

## 2017-01-11 DIAGNOSIS — F1110 Opioid abuse, uncomplicated: Secondary | ICD-10-CM | POA: Diagnosis present

## 2017-01-11 DIAGNOSIS — Z87891 Personal history of nicotine dependence: Secondary | ICD-10-CM | POA: Diagnosis not present

## 2017-01-11 DIAGNOSIS — G43001 Migraine without aura, not intractable, with status migrainosus: Secondary | ICD-10-CM | POA: Diagnosis not present

## 2017-01-11 DIAGNOSIS — G894 Chronic pain syndrome: Secondary | ICD-10-CM | POA: Diagnosis present

## 2017-01-11 DIAGNOSIS — I484 Atypical atrial flutter: Secondary | ICD-10-CM

## 2017-01-11 DIAGNOSIS — I440 Atrioventricular block, first degree: Secondary | ICD-10-CM | POA: Diagnosis present

## 2017-01-11 DIAGNOSIS — F0150 Vascular dementia without behavioral disturbance: Secondary | ICD-10-CM | POA: Diagnosis not present

## 2017-01-11 DIAGNOSIS — R42 Dizziness and giddiness: Secondary | ICD-10-CM | POA: Diagnosis present

## 2017-01-11 DIAGNOSIS — G4700 Insomnia, unspecified: Secondary | ICD-10-CM | POA: Diagnosis present

## 2017-01-11 DIAGNOSIS — B182 Chronic viral hepatitis C: Secondary | ICD-10-CM | POA: Diagnosis present

## 2017-01-11 DIAGNOSIS — F1010 Alcohol abuse, uncomplicated: Secondary | ICD-10-CM | POA: Diagnosis not present

## 2017-01-11 DIAGNOSIS — G609 Hereditary and idiopathic neuropathy, unspecified: Secondary | ICD-10-CM | POA: Diagnosis present

## 2017-01-11 DIAGNOSIS — W1830XA Fall on same level, unspecified, initial encounter: Secondary | ICD-10-CM | POA: Diagnosis present

## 2017-01-11 DIAGNOSIS — S01111A Laceration without foreign body of right eyelid and periocular area, initial encounter: Secondary | ICD-10-CM | POA: Diagnosis present

## 2017-01-11 DIAGNOSIS — I340 Nonrheumatic mitral (valve) insufficiency: Secondary | ICD-10-CM | POA: Diagnosis present

## 2017-01-11 DIAGNOSIS — Z9889 Other specified postprocedural states: Secondary | ICD-10-CM | POA: Diagnosis not present

## 2017-01-11 DIAGNOSIS — I4892 Unspecified atrial flutter: Principal | ICD-10-CM

## 2017-01-11 DIAGNOSIS — G40909 Epilepsy, unspecified, not intractable, without status epilepticus: Secondary | ICD-10-CM | POA: Diagnosis not present

## 2017-01-11 DIAGNOSIS — Z23 Encounter for immunization: Secondary | ICD-10-CM | POA: Diagnosis not present

## 2017-01-11 DIAGNOSIS — F1020 Alcohol dependence, uncomplicated: Secondary | ICD-10-CM | POA: Diagnosis present

## 2017-01-11 DIAGNOSIS — I4430 Unspecified atrioventricular block: Secondary | ICD-10-CM | POA: Diagnosis present

## 2017-01-11 DIAGNOSIS — Y92009 Unspecified place in unspecified non-institutional (private) residence as the place of occurrence of the external cause: Secondary | ICD-10-CM | POA: Diagnosis not present

## 2017-01-11 DIAGNOSIS — F0391 Unspecified dementia with behavioral disturbance: Secondary | ICD-10-CM | POA: Diagnosis not present

## 2017-01-11 DIAGNOSIS — F319 Bipolar disorder, unspecified: Secondary | ICD-10-CM | POA: Diagnosis present

## 2017-01-11 DIAGNOSIS — K769 Liver disease, unspecified: Secondary | ICD-10-CM | POA: Diagnosis not present

## 2017-01-11 DIAGNOSIS — R296 Repeated falls: Secondary | ICD-10-CM | POA: Diagnosis present

## 2017-01-11 DIAGNOSIS — I4891 Unspecified atrial fibrillation: Secondary | ICD-10-CM | POA: Diagnosis not present

## 2017-01-11 DIAGNOSIS — G43909 Migraine, unspecified, not intractable, without status migrainosus: Secondary | ICD-10-CM | POA: Diagnosis present

## 2017-01-11 DIAGNOSIS — Z8679 Personal history of other diseases of the circulatory system: Secondary | ICD-10-CM | POA: Diagnosis not present

## 2017-01-11 DIAGNOSIS — I200 Unstable angina: Secondary | ICD-10-CM | POA: Diagnosis present

## 2017-01-11 DIAGNOSIS — F0281 Dementia in other diseases classified elsewhere with behavioral disturbance: Secondary | ICD-10-CM | POA: Diagnosis not present

## 2017-01-11 DIAGNOSIS — I471 Supraventricular tachycardia: Secondary | ICD-10-CM | POA: Diagnosis present

## 2017-01-11 DIAGNOSIS — R0789 Other chest pain: Secondary | ICD-10-CM

## 2017-01-11 DIAGNOSIS — F1910 Other psychoactive substance abuse, uncomplicated: Secondary | ICD-10-CM | POA: Diagnosis not present

## 2017-01-11 DIAGNOSIS — G40419 Other generalized epilepsy and epileptic syndromes, intractable, without status epilepticus: Secondary | ICD-10-CM | POA: Diagnosis present

## 2017-01-11 DIAGNOSIS — Z8673 Personal history of transient ischemic attack (TIA), and cerebral infarction without residual deficits: Secondary | ICD-10-CM | POA: Diagnosis not present

## 2017-01-11 DIAGNOSIS — Z538 Procedure and treatment not carried out for other reasons: Secondary | ICD-10-CM | POA: Diagnosis not present

## 2017-01-11 LAB — ECHOCARDIOGRAM COMPLETE
Ao-asc: 37 cm
EWDT: 176 ms
FS: 30 % (ref 28–44)
Height: 78 in
IVS/LV PW RATIO, ED: 1.01
LA ID, A-P, ES: 36 mm
LA diam index: 1.49 cm/m2
LA vol A4C: 66.2 ml
LA vol index: 30.2 mL/m2
LAVOL: 73.1 mL
LDCA: 4.52 cm2
LEFT ATRIUM END SYS DIAM: 36 mm
LVOT diameter: 24 mm
MV Dec: 176
MV pk A vel: 57.2 m/s
MVAP: 4.23 cm2
MVPG: 3 mmHg
MVPKEVEL: 92.1 m/s
P 1/2 time: 52 ms
PV Reg vel dias: 110 cm/s
PW: 15.3 mm — AB (ref 0.6–1.1)
RV TAPSE: 23.4 mm
Weight: 3717.84 oz

## 2017-01-11 LAB — CBC
HCT: 40.5 % (ref 39.0–52.0)
HEMATOCRIT: 38.4 % — AB (ref 39.0–52.0)
HEMOGLOBIN: 13.2 g/dL (ref 13.0–17.0)
Hemoglobin: 14.1 g/dL (ref 13.0–17.0)
MCH: 33.8 pg (ref 26.0–34.0)
MCH: 33.7 pg (ref 26.0–34.0)
MCHC: 34.4 g/dL (ref 30.0–36.0)
MCHC: 34.8 g/dL (ref 30.0–36.0)
MCV: 97.1 fL (ref 78.0–100.0)
MCV: 98 fL (ref 78.0–100.0)
PLATELETS: 268 10*3/uL (ref 150–400)
Platelets: 259 10*3/uL (ref 150–400)
RBC: 4.17 MIL/uL — AB (ref 4.22–5.81)
RBC: 3.92 MIL/uL — AB (ref 4.22–5.81)
RDW: 12.4 % (ref 11.5–15.5)
RDW: 12.4 % (ref 11.5–15.5)
WBC: 9.1 10*3/uL (ref 4.0–10.5)
WBC: 10.7 10*3/uL — AB (ref 4.0–10.5)

## 2017-01-11 LAB — TROPONIN I: Troponin I: <0.03 ng/mL (ref <0.03)

## 2017-01-11 LAB — RAPID URINE DRUG SCREEN, HOSP PERFORMED
AMPHETAMINES: NOT DETECTED
Barbiturates: NOT DETECTED
Benzodiazepines: NOT DETECTED
Cocaine: NOT DETECTED
OPIATES: POSITIVE — AB
Tetrahydrocannabinol: NOT DETECTED

## 2017-01-11 LAB — BASIC METABOLIC PANEL
ANION GAP: 8 (ref 5–15)
BUN: 13 mg/dL (ref 6–20)
CALCIUM: 8.4 mg/dL — AB (ref 8.9–10.3)
CHLORIDE: 107 mmol/L (ref 101–111)
CO2: 21 mmol/L — AB (ref 22–32)
Creatinine, Ser: 0.82 mg/dL (ref 0.61–1.24)
GFR calc non Af Amer: >60 mL/min (ref >60)
GLUCOSE: 97 mg/dL (ref 65–99)
POTASSIUM: 4 mmol/L (ref 3.5–5.1)
Sodium: 136 mmol/L (ref 135–145)

## 2017-01-11 LAB — ETHANOL: Alcohol, Ethyl (B): <10 mg/dL (ref <10)

## 2017-01-11 LAB — HEPARIN LEVEL (UNFRACTIONATED): Heparin Unfractionated: 0.39 IU/mL (ref 0.30–0.70)

## 2017-01-11 LAB — PATHOLOGIST SMEAR REVIEW

## 2017-01-11 MED ORDER — HEPARIN (PORCINE) IN NACL 100-0.45 UNIT/ML-% IJ SOLN
1500.0000 [IU]/h | INTRAMUSCULAR | Status: DC
  Administered 2017-01-11: 1500 [IU]/h via INTRAVENOUS
  Filled 2017-01-11: qty 250

## 2017-01-11 MED ORDER — MORPHINE SULFATE (PF) 2 MG/ML IV SOLN
2.0000 mg | Freq: Once | INTRAVENOUS | Status: AC
  Administered 2017-01-11: 2 mg via INTRAVENOUS
  Filled 2017-01-11: qty 1

## 2017-01-11 MED ORDER — HEPARIN BOLUS VIA INFUSION
4000.0000 [IU] | Freq: Once | INTRAVENOUS | Status: AC
  Administered 2017-01-11: 4000 [IU] via INTRAVENOUS
  Filled 2017-01-11: qty 4000

## 2017-01-11 MED ORDER — NITROGLYCERIN 0.4 MG SL SUBL
0.4000 mg | SUBLINGUAL_TABLET | SUBLINGUAL | Status: DC | PRN
  Administered 2017-01-11 (×2): 0.4 mg via SUBLINGUAL
  Filled 2017-01-11: qty 1

## 2017-01-11 MED ORDER — APIXABAN 5 MG PO TABS: 5.0000 mg | ORAL_TABLET | Freq: Two times a day (BID) | ORAL | Status: DC

## 2017-01-11 MED ORDER — ACETAMINOPHEN 325 MG PO TABS
650.0000 mg | ORAL_TABLET | Freq: Four times a day (QID) | ORAL | Status: DC | PRN
  Administered 2017-01-11 – 2017-01-20 (×6): 650 mg via ORAL
  Filled 2017-01-11 (×7): qty 2

## 2017-01-11 MED ORDER — APIXABAN 5 MG PO TABS
5.0000 mg | ORAL_TABLET | Freq: Two times a day (BID) | ORAL | Status: DC
  Administered 2017-01-12 – 2017-01-20 (×17): 5 mg via ORAL
  Filled 2017-01-11 (×17): qty 1

## 2017-01-11 MED ORDER — APIXABAN 5 MG PO TABS
10.0000 mg | ORAL_TABLET | Freq: Once | ORAL | Status: AC
Start: 2017-01-11 — End: 2017-01-11
  Administered 2017-01-11: 10 mg via ORAL
  Filled 2017-01-11: qty 2

## 2017-01-11 NOTE — Progress Notes (Signed)
ANTICOAGULATION CONSULT NOTE   Pharmacy Consult for Heparin Indication: chest pain/ACS  Allergies  Allergen Reactions  . Carbamazepine Other (See Comments)    Hyponatremia  . Depakote [Divalproex Sodium] Other (See Comments)    [facility]   Patient Measurements: Height: 6\' 6"  (198.1 cm) Weight: 232 lb 5.8 oz (105.4 kg) IBW/kg (Calculated) : 91.4 Heparin Dosing Weight: 105 Kg  Vital Signs: Temp: 98.4 F (36.9 C) (10/24 0747) Temp Source: Oral (10/24 0747) BP: 108/62 (10/24 0747) Pulse Rate: 95 (10/24 0747)  Labs:  Recent Labs  01/10/17 0747  01/10/17 1822 01/11/17 0037 01/11/17 0929 01/11/17 1451  HGB 14.5  --   --  13.2  --  14.1  HCT 41.3  --   --  38.4*  --  40.5  PLT 300  --   --  259  --  268  HEPARINUNFRC  --   --   --   --   --  0.39  CREATININE 0.97  --   --  0.82  --   --   TROPONINI  --   < > <0.03 <0.03 <0.03  --   < > = values in this interval not displayed. Estimated Creatinine Clearance: 126.9 mL/min (by C-G formula based on SCr of 0.82 mg/dL).  Medical History: Past Medical History:  Diagnosis Date  . Aneurysm (HCC)   . ETOHism (HCC)   . Gait abnormality 05/26/2016  . Hepatitis C   . 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 Hypertension   . Seizure (HCC)    Medications:  Prescriptions Prior to Admission  Medication Sig Dispense Refill Last Dose  . acetaminophen (TYLENOL) 500 MG tablet Take 1,000 mg by mouth 2 (two) times daily.   01/09/2017 at Unknown time  . cephALEXin (KEFLEX) 500 MG capsule Take 500 mg by mouth 3 (three) times daily. For seven days   01/09/2017 at Unknown time  . Cholecalciferol (VITAMIN D3) 1000 units CAPS Take 1,000 Units by mouth daily.   01/09/2017 at Unknown time  . Lacosamide (VIMPAT) 100 MG TABS Take 1 tablet (100 mg total) by mouth 2 (two) times daily. (Patient taking differently: Take 100 mg by mouth 3 (three) times daily. ) 60 tablet 3 01/09/2017 at Unknown time  . levETIRAcetam (KEPPRA)  750 MG tablet Take 2 tablets (1,500 mg total) by mouth in the morning and in the evening daily. 120 tablet 2 01/09/2017 at Unknown time  . Melatonin 3 MG TABS Take 3 mg by mouth at bedtime.   01/09/2017 at Unknown time  . metoprolol tartrate (LOPRESSOR) 25 MG tablet TAKE 1 TABLET BY MOUTH TWICE DAILY (Patient taking differently: TAKE 1 TABLET (25mg ) BY MOUTH TWICE DAILY) 60 tablet 3 01/09/2017 at 2000  . QUEtiapine (SEROQUEL) 50 MG tablet Take 50 mg by mouth daily.   1 01/09/2017 at Unknown time  . sertraline (ZOLOFT) 100 MG tablet Take 100 mg by mouth daily.   01/09/2017 at Unknown time  . zolpidem (AMBIEN) 10 MG tablet Take 10 mg by mouth at bedtime.    01/09/2017 at Unknown time  . diclofenac sodium (VOLTAREN) 1 % GEL Apply 4 g topically 4 (four) times daily. To elbow. Please instruct in dosing. (Patient not taking: Reported on 01/10/2017) 1 Tube 2 Not Taking at Unknown time   Assessment: [name] [name] is a 56 y.o. male who presented from group home after he "just fell out". No anticoagulation PTA and head CT clear of any intracranial bleeding. EMS reports  patient heart rate in the 200's. Pharmacy has been asked to start heparin for ACS. CBC WNL.  Initial heparin level within goal range.  No overt bleeding or complications noted.  Goal of Therapy:  Heparin level 0.3-0.7 units/ml Monitor platelets by anticoagulation protocol: Yes   Plan:  Continue IV Heparin at current rate. Confirm heparin level in 6 hrs. Daily heparin level and CBC. F/u plans for long-term anticoagulation.  Tad Moore, BCPS  Clinical Pharmacist Pager [phone]  01/11/2017 4:01 PM

## 2017-01-11 NOTE — Consult Note (Signed)
Cardiology Consultation:   Patient ID: [name] [name]; [id number]; 1960/10/01   Admit date: 01/10/2017 Date of Consult: 01/11/2017  Primary Care Provider: Mortimer Fries, PA Primary Cardiologist: new to [facility] (none previously)   Patient Profile:   [name] [name] is a 56 y.o. 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 Royann Shivers.  History of Present Illness:   [name] [name] was brought to [facility] via EMS after a 3rd syncopal episode in the last few days suffering trauma to head, and upon waking felt palpitations and some chest discomfort found in an SVT given adenosine without change in rhythm and started on dilt gtt.  With slowing of the rhythm noted to be AFlutter with AR ate about 200bpm, V rates have maintained 100 or so.  In discussion with dr. Salena Saner, he has concerns of long term a/c for this patient and asks EP to revaluate for ablation.  In review of records, he is followed by [facility] neurology group, last seen only last month, at least back to March there were reports of weekly seizures back in march, though none witnessed, as well as falls associated with his gait instability, intermittent confusion.   LABS K+ 4.0 BUN/Creat 14/0.97 WBC 13.1 Lactic acid 2.28 ? H/H 14/41 plts 300 TSH 1.506 Trop I: <0.03 x3  In discussion with the patient, he reports years of seizures, though since switching from Tegretol to current medicine these episodes have increased in frequency to about 2 month in the last year, 3 in the last week.  He feels these are not fainting spells, says his vision gets white even though his eyes are closed, a sensation of spinning and wakes feeling like he has lost time, is confused about time/place for quite a  while, always gets the feeling that hours or days have passed by.  These have happened sitting supine or standing.  He denies N/V, no diaphoresis, palpitations associated with any of these episodes including yesterday or this week. Reports of CP this time is a new symptom for these episodes, but has had some unclear chest discomfort over the years and tells me he has had a heart cath in 2005 when they "tied off a vessel in my heart", I suspect this may have been his aneurysm intervention.  He admits to years of ETOH abuse and has used drugs on occassion over the years (not specific to what), but has not drank or used drugs in over a year.  He is not having any particular symptoms at this time.  I get the feeling he has understanding of our discussion, he asks appropriate questions and seems appropriately concerned about having an arrhythmia.  We discussed plans for TEE tomorrow, the procedure, it's indication and rational for the test, as well as risks associated with including anesthesia.  He would like to proceed.   Past Medical History:  Diagnosis Date  . Aneurysm (HCC)   . ETOHism (HCC)   . Gait abnormality 05/26/2016  . Hepatitis C   . 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 Hypertension   . Seizure [facility])     Past Surgical History:  Procedure Laterality Date  . CEREBRAL ANEURYSM REPAIR     At [facility]  . HARDWARE  REMOVAL Left 10/29/2012   Procedure: HARDWARE REMOVAL;  Surgeon: Sheral Apleyimothy D Murphy, MD;  [facility];  Service: Orthopedics;  Laterality: Left;  . I&D EXTREMITY Left 10/26/2012   Procedure: IRRIGATION AND DEBRIDEMENT Left Elbow;  Surgeon: Sheral Apleyimothy D Murphy, MD;  Location: [facility] OR;  Service: Orthopedics;  Laterality: Left;  . I&D EXTREMITY Left 10/29/2012   Procedure: IRRIGATION AND DEBRIDEMENT EXTREMITY, wound vac change, stimulan beads;  Surgeon: Sheral Apleyimothy D Murphy, MD;  Location: [facility] OR;  Service: Orthopedics;  Laterality: Left;  lateral on bean bag  .  I&D EXTREMITY Left 11/02/2012   Procedure: IRRIGATION AND DEBRIDEMENT EXTREMITY;  Surgeon: Sheral Apleyimothy D Murphy, MD;  Location: [facility] OR;  Service: Orthopedics;  Laterality: Left;  . INCISION AND DRAINAGE ABSCESS Left 11/02/2012   Procedure: INCISION AND DRAINAGE ABSCESS;  Surgeon: Sheral Apleyimothy D Murphy, MD;  Location: [facility] OR;  Service: Orthopedics;  Laterality: Left;       Inpatient Medications: Scheduled Meds: . acetaminophen  1,000 mg Oral BID  . aspirin EC  81 mg Oral Daily  . cholecalciferol  1,000 Units Oral Daily  . diltiazem  60 mg Oral Q8H  . levETIRAcetam  750 mg Oral BID  . Melatonin  3 mg Oral QHS  . QUEtiapine  50 mg Oral Daily  . sertraline  100 mg Oral Daily  . sodium chloride flush  3 mL Intravenous Q12H  . zolpidem  10 mg Oral QHS   Continuous Infusions: . sodium chloride    . diltiazem (CARDIZEM) infusion 15 mg/hr (01/11/17 0926)  . heparin 1,500 Units/hr (01/11/17 0844)   PRN Meds: sodium chloride, nitroGLYCERIN, sodium chloride flush  Allergies:    Allergies  Allergen Reactions  . Carbamazepine Other (See Comments)    Hyponatremia  . Depakote [Divalproex Sodium] Other (See Comments)    Unknown - on MAR    Social History:   Social History   Social History  . Marital status: Single    Spouse name: N/A  . Number of children: 3  . Years of education: 3911 th   Occupational History  . disabled    Social History Main Topics  . Smoking status: Former Smoker    Packs/day: 0.25    Types: Cigarettes  . Smokeless tobacco: Never Used  . Alcohol use No     Comment: quit drinking 11/2011 per patient  . Drug use: No  . Sexual activity: Not Currently   Other Topics Concern  . Not on file   Social History Narrative   Patient is single and lives at home alone.   Disabled.   Education 11 th grade.   Right handed.   Caffeine none .    Family History:   Family History  Problem Relation Age of Onset  . Hypertension Mother   . Cancer Mother   . Hypertension  Father   . Diabetes Sister   . Cancer Sister   . Cancer Sister      ROS:  Please see the history of present illness.  ROS  All other ROS reviewed and negative.     Physical Exam/Data:   Vitals:   01/11/17 0400 01/11/17 0500 01/11/17 0600 01/11/17 0747  BP: 107/77 (!) 93/38 107/74 108/62  Pulse:    95  Resp: (!) 24 (!) 26 (!) 26 20  Temp:    98.4 F (36.9 C)  TempSrc:    Oral  SpO2:    97%  Weight:      Height:  Intake/Output Summary (Last 24 hours) at 01/11/17 1127 Last data filed at 01/11/17 1034  Gross per 24 hour  Intake              510 ml  Output              400 ml  Net              110 ml   Filed Weights   01/10/17 1750  Weight: 232 lb 5.8 oz (105.4 kg)   Body mass index is 26.85 kg/m.  General:  Well nourished, well developed, in no acute distress HEENT: bandage over R eye is clean and dry, poor.missing dentitian Lymph: no adenopathy Neck: no JVD Endocrine:  No thryomegaly Vascular: No carotid bruits; FA pulses 2+ bilaterally without bruits  Cardiac:  IRRR, no murmurs, gallops or rubs Lungs:  CTA b/l, no wheezing, rhonchi or rales  Abd: soft, nontender  Ext: no edema Musculoskeletal:  No deformities, BUE and BLE strength normal and equal Skin: warm and dry  Neuro:  CNs 2-12 intact, no focal abnormalities noted Psych:  Normal affect speech pattern is somewhat unusual  EKG:  The EKG was personally reviewed and demonstrates:   Admission: SVT 202bpm, QRS 85ms Today: AFlutter, V rate 96bpm 01/24/14: SR 63bpm, PR , QRS 82ms, QTc Telemetry:  Telemetry was personally reviewed and demonstrates:   AFlutter 80's currently  Relevant CV Studies:  Echo this admit has been ordered and pending  10/19/14: TTE Study Conclusions - Left ventricle: The cavity size was normal. There was mild   concentric hypertrophy. Systolic function was normal. The   estimated ejection fraction was in the range of 55% to 60%.   Doppler parameters are consistent  with high ventricular filling   pressure. 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 - Right atrium: The atrium was mildly dilated. - Atrial septum: No defect or patent foramen ovale was identified.  Laboratory Data:  Chemistry Recent Labs Lab 01/10/17 0747 01/11/17 0037  NA 137 136  K 4.0 4.0  CL 107 107  CO2 20* 21*  GLUCOSE 125* 97  BUN 14 13  CREATININE 0.97 0.82  CALCIUM 8.6* 8.4*  GFRNONAA >60 >60  GFRAA >60 >60  ANIONGAP 10 8     Recent Labs Lab 01/10/17 0747  PROT 7.8  ALBUMIN 3.2*  AST 25  ALT 16*  ALKPHOS 71  BILITOT 0.8   Hematology Recent Labs Lab 01/10/17 0747 01/11/17 0037  WBC 13.1* 9.1  RBC 4.22 3.92*  HGB 14.5 13.2  HCT 41.3 38.4*  MCV 97.9 98.0  MCH 34.4* 33.7  MCHC 35.1 34.4  RDW 12.2 12.4  PLT 300 259   Cardiac Enzymes Recent Labs Lab 01/10/17 1324 01/10/17 1822 01/11/17 0037 01/11/17 0929  TROPONINI <0.03 <0.03 <0.03 <0.03    Recent Labs Lab 01/10/17 0908  TROPIPOC 0.03    BNPNo results for input(s): BNP, PROBNP in the last 168 hours.  DDimer No results for input(s): DDIMER in the last 168 hours.  Radiology/Studies:   Ct Head Wo Contrast= Result Date: 01/10/2017 CLINICAL DATA:  Pain following fall EXAM: CT HEAD WITHOUT CONTRAST CT CERVICAL SPINE WITHOUT CONTRAST TECHNIQUE: Multidetector CT imaging of the head and cervical spine was performed following the standard protocol without intravenous contrast. Multiplanar CT image reconstructions of the cervical spine were also generated. COMPARISON:  Brain MRI November 03, 2014; head CT and cervical spine CT October 31, 2014 FINDINGS: CT HEAD FINDINGS Brain: Moderate diffuse atrophy  is stable. There is no intracranial mass, hemorrhage, extra-axial fluid collection, or midline shift. 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 Elsewhere there is patchy small vessel disease in the centra semiovale bilaterally. No new gray-white compartment  lesions evident. No evidence of acute infarct. Air is noted to the right of the sella, chronic and stable from the 2016 study. Vascular: There is no hyperdense vessel. There is calcification in the carotid siphon regions. Skull: Bony calvarium appears intact. Sinuses/Orbits: Old nasal fractures appear stable. There is evidence of old trauma involving the medial left orbital wall, stable. There is mucosal thickening in both maxillary antra as well as in multiple ethmoid air cells. Other visualized paranasal sinuses are clear. No intraorbital lesions are evident. Other: Mastoid air cells are clear. CT CERVICAL SPINE FINDINGS Alignment: There is no appreciable spondylolisthesis. Skull base and vertebrae: Skull base and craniocervical junction regions appear normal. There is no acute fracture. No blastic or lytic bone lesions evident. Soft tissues and spinal canal: Prevertebral soft tissues and predental space regions are normal. There are no paraspinous lesions. There is no evident cord or canal hematoma. Disc levels: There is moderate disc space narrowing at C4-5. There is somewhat more severe disc space narrowing at C5-6, C6-7, and C7-T1. There are prominent anterior osteophytes at C4, C5, C6, and to a slightly lesser extent at C7. There is facet hypertrophy at multiple levels. There is exit foraminal narrowing due to bony hypertrophy on the left at C3-4, at C4-5 bilaterally, and at C6-7 bilaterally. No frank disc extrusion or stenosis. Upper chest: Visualized upper lung zones are clear. Other: There are foci of calcification in both subclavian arteries as well as in both carotid arteries. IMPRESSION: CT head: Stable moderate diffuse atrophy, slightly more pronounced involving the cerebellum than elsewhere. 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 No acute infarct. There is patchy periventricular small vessel disease. No mass, hemorrhage, or extra-axial fluid collection. Areas  present to the right of the sella, chronic and stable. There appears to be a connection between the posterior sphenoid sinus in this area, possibly residua of old trauma. There are areas of prior nasal trauma and trauma involving the medial left orbital wall. There are foci of paranasal sinus disease currently. There are foci of arterial vascular calcification. CT cervical spine: No fracture or spondylolisthesis. Multilevel osteoarthritic change present. There are foci of calcification in both carotid and subclavian arteries. Electronically Signed   By: Bretta Bang III M.D.   On: 01/10/2017 10:54     Dg Chest Portable 1 View Result Date: 01/10/2017 CLINICAL DATA:  Pain following fall.  Supraventricular tachycardia EXAM: PORTABLE CHEST 1 VIEW COMPARISON:  November 04, 2014 FINDINGS: There is no edema or consolidation. Heart is mildly enlarged with pulmonary vascularity within normal limits. No adenopathy. There is aortic atherosclerosis. No pneumothorax. There is evidence of old rib trauma on the right, unchanged. IMPRESSION: Stable cardiac prominence. Aortic atherosclerosis. No edema or consolidation. Aortic Atherosclerosis (ICD10-I70.0). Electronically Signed   By: Bretta Bang III M.D.   On: 01/10/2017 09:16    Assessment and Plan:   1.  Aflutter w/RVR 1:1 conduction       CHA2DS2Vasc is 1, He has evidence of old strokes on his CT, ? If from prior bleed, CHA2DS2Vasc 3 ?      HASBLED is 3-4      Heparin gtt currently  2  Seizures  3. Bipolar--compensated 4. Hx of polysubstance abuse, clean x  1 yr 5.  CVA old AFlutter looks typical may best be served w/ablation  Dr. Graciela Husbands will see the patient         For questions or updates, please contact [facility] Please consult [email or web address] for contact info under Cardiology/STEMI.   Signed, Sheilah Pigeon, PA-C  01/11/2017 11:27 AM  Patient seen interviewed and examined  Patient has atrial flutter with one-to-one conduction.  No prior history. Probably most reasonable to undertake catheter ablation and have reviewed this with the patient. He will undergo TEE prior.  Patient is currently scheduled for tomorrow with Dr. Fawn Kirk under anesthesia, the latter important given his neuropsychiatric diseases  He is not a great candidate for long-term anticoagulation. Neuro consultation might be helpful to look at 2 issues 1-his seizures and antiepileptic medications and 2-are the strokes on neuro imaging consistent with embolic events.

## 2017-01-11 NOTE — Progress Notes (Signed)
ANTICOAGULATION CONSULT NOTE - Initial Consult  Pharmacy Consult for Heparin Indication: chest pain/ACS  Allergies  Allergen Reactions  . Carbamazepine Other (See Comments)    Hyponatremia  . Depakote [Divalproex Sodium] Other (See Comments)    Unknown - on [facility]   Patient Measurements: Height: 6\' 6"  (198.1 cm) Weight: 232 lb 5.8 oz (105.4 kg) IBW/kg (Calculated) : 91.4 Heparin Dosing Weight: 105 Kg  Vital Signs: Temp: 98.4 F (36.9 C) (10/24 0747) Temp Source: Oral (10/24 0747) BP: 108/62 (10/24 0747) Pulse Rate: 95 (10/24 0747)  Labs:  Recent Labs  01/10/17 0747 01/10/17 1324 01/10/17 1822 01/11/17 0037  HGB 14.5  --   --  13.2  HCT 41.3  --   --  38.4*  PLT 300  --   --  259  CREATININE 0.97  --   --  0.82  TROPONINI  --  <0.03 <0.03 <0.03   Estimated Creatinine Clearance: 126.9 mL/min (by C-G formula based on SCr of 0.82 mg/dL).  Medical History: Past Medical History:  Diagnosis Date  . Aneurysm (HCC)   . ETOHism (HCC)   . Gait abnormality 05/26/2016  . Hepatitis C   . 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 Hypertension   . Seizure (HCC)    Medications:  Prescriptions Prior to Admission  Medication Sig Dispense Refill Last Dose  . acetaminophen (TYLENOL) 500 MG tablet Take 1,000 mg by mouth 2 (two) times daily.   01/09/2017 at Unknown time  . cephALEXin (KEFLEX) 500 MG capsule Take 500 mg by mouth 3 (three) times daily. For seven days   01/09/2017 at Unknown time  . Cholecalciferol (VITAMIN D3) 1000 units CAPS Take 1,000 Units by mouth daily.   01/09/2017 at Unknown time  . Lacosamide (VIMPAT) 100 MG TABS Take 1 tablet (100 mg total) by mouth 2 (two) times daily. (Patient taking differently: Take 100 mg by mouth 3 (three) times daily. ) 60 tablet 3 01/09/2017 at Unknown time  . levETIRAcetam (KEPPRA) 750 MG tablet Take 2 tablets (1,500 mg total) by mouth in the morning and in the evening daily. 120 tablet 2 01/09/2017 at Unknown time   . Melatonin 3 MG TABS Take 3 mg by mouth at bedtime.   01/09/2017 at Unknown time  . metoprolol tartrate (LOPRESSOR) 25 MG tablet TAKE 1 TABLET BY MOUTH TWICE DAILY (Patient taking differently: TAKE 1 TABLET (25mg ) BY MOUTH TWICE DAILY) 60 tablet 3 01/09/2017 at 2000  . QUEtiapine (SEROQUEL) 50 MG tablet Take 50 mg by mouth daily.   1 01/09/2017 at Unknown time  . sertraline (ZOLOFT) 100 MG tablet Take 100 mg by mouth daily.   01/09/2017 at Unknown time  . zolpidem (AMBIEN) 10 MG tablet Take 10 mg by mouth at bedtime.    01/09/2017 at Unknown time  . diclofenac sodium (VOLTAREN) 1 % GEL Apply 4 g topically 4 (four) times daily. To elbow. Please instruct in dosing. (Patient not taking: Reported on 01/10/2017) 1 Tube 2 Not Taking at Unknown time   Assessment: [name] [name] is a 56 y.o. male who presented from group home after he "just fell out". No anticoagulation PTA and head CT clear of any intracranial bleeding. EMS reports patient heart rate in the 200's. Pharmacy has been asked to start heparin for ACS. CBC WNL.  Goal of Therapy:  Heparin level 0.3-0.7 units/ml Monitor platelets by anticoagulation protocol: Yes   Plan:  Give 4000 units bolus x 1 Start heparin infusion at 1500 units/hr Check anti-Xa level  in 6 hours and daily while on heparin Continue to monitor H&H and platelets  Anastasio Wogan L Barbera Perritt 01/11/2017,8:34 AM

## 2017-01-11 NOTE — Progress Notes (Signed)
  Echocardiogram 2D Echocardiogram has been performed.  [name] [name] G [name] [name] 01/11/2017, 10:28 AM

## 2017-01-11 NOTE — Consult Note (Signed)
Cardiology Consult    Patient ID: [name] [name]; [id number]; 1960/05/04   Admit date: 01/10/2017 Date of Consult: 01/11/2017  Primary Care Provider: Mortimer Fries, PA Primary Cardiologist: New to [facility] - Dr. Royann Shivers   Patient Profile    [name] [name] is a 56 y.o. male with past medical history of Seizure disorder (secondary to prior brain aneurysm per the patient's report), Bipolar disorder, HTN, Hepatitis C, and alcohol abuse who is being seen today for the evaluation of chest pain and atrial flutter with RVR at the request of Dr. Joseph Art.   History of Present Illness    Mr. Baze presented to [facility] ED on 01/10/2017 for evaluation of a fall at which time he struck his head.   He reports having experienced 3 seizures over the past 3 days. He resides in a group home but says he stays in a room by himself, therefore these are not initially witnessed. He does note prodromal symptoms of becoming diaphoretic and "shaking" all over followed by a loss of consciousness. Reports hitting him head with each episode and feeling "very tired" after waking. He is compliant with Keppra but says this is not helping.   Following the episode yesterday, he noted pain along his chest and felt his heart racing. Upon EMS arrival, he was found to be in presumed SVT with HR in the low-200's but was given Adenosine (initially 6mg  then 12mg ) with no change in his rhythm. Was started on IV Diltiazem while in the ED with improvement in his HR into the low-100's. He remains in atrial flutter with HR in the low-100's at this time.   He denies any prior cardiac history. No known CAD, prior MI's or cardiac arrhythmias.   Initial labs show WBC of 9.1, Hgb 13.2, platelets 259, Na+ 136, K+ 4.0, creatinine 0.82. UDS pending. Ethanol negative. Initial troponin negative with repeat values pending. CXR showing aortic atherosclerosis with no edema or consolidation. 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 No acute infarct.    Past Medical History   Past Medical History:  Diagnosis Date  . Aneurysm (HCC)   . ETOHism (HCC)   . Gait abnormality 05/26/2016  . Hepatitis C   . 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 Hypertension   . Seizure (HCC)      Allergies:   Allergies  Allergen Reactions  . Carbamazepine Other (See Comments)    Hyponatremia  . Depakote [Divalproex Sodium] Other (See Comments)    Unknown - on MAR    Home Medications:   Home Medications:  Prior to Admission medications   Medication Sig Start Date End Date Taking? Authorizing Provider  acetaminophen (TYLENOL) 500 MG tablet Take 1,000 mg by mouth 2 (two) times daily.   Yes [provider]  cephALEXin (KEFLEX) 500 MG capsule Take 500 mg by mouth 3 (three) times daily. For seven days   Yes [provider]  Cholecalciferol (VITAMIN D3) 1000 units CAPS Take 1,000 Units by mouth daily.   Yes [provider]  Lacosamide (VIMPAT) 100 MG TABS Take 1 tablet (100 mg total) by mouth 2 (two) times daily. Patient taking differently: Take 100 mg by mouth 3 (three) times daily.  07/11/16  Yes York Spaniel, MD  levETIRAcetam (KEPPRA) 750 MG tablet Take 2 tablets (1,500 mg total) by mouth in the morning and in  the evening daily. 04/14/16  Yes Butch Penny, NP  Melatonin 3 MG TABS Take 3 mg by mouth at bedtime.   Yes [provider]  metoprolol tartrate (LOPRESSOR) 25 MG tablet TAKE 1 TABLET BY MOUTH TWICE DAILY Patient taking differently: TAKE 1 TABLET (25mg ) BY MOUTH TWICE DAILY 07/12/16  Yes Jegede, Olugbemiga E, MD  QUEtiapine (SEROQUEL) 50 MG tablet Take 50 mg by mouth daily.  04/11/15  Yes [provider]  sertraline (ZOLOFT) 100 MG tablet Take 100 mg by mouth daily.   Yes [provider]  zolpidem (AMBIEN) 10 MG  tablet Take 10 mg by mouth at bedtime.    Yes [provider]  diclofenac sodium (VOLTAREN) 1 % GEL Apply 4 g topically 4 (four) times daily. To elbow. Please instruct in dosing. Patient not taking: Reported on 01/10/2017 02/11/15   Linna Hoff, MD    Inpatient Medications    Scheduled Meds: . acetaminophen  1,000 mg Oral BID  . aspirin EC  81 mg Oral Daily  . cholecalciferol  1,000 Units Oral Daily  . diltiazem  60 mg Oral Q8H  . levETIRAcetam  750 mg Oral BID  . Melatonin  3 mg Oral QHS  . QUEtiapine  50 mg Oral Daily  . sertraline  100 mg Oral Daily  . sodium chloride flush  3 mL Intravenous Q12H  . zolpidem  10 mg Oral QHS   Continuous Infusions: . sodium chloride    . diltiazem (CARDIZEM) infusion 15 mg/hr (01/11/17 0926)  . heparin 1,500 Units/hr (01/11/17 0844)   PRN Meds: sodium chloride, nitroGLYCERIN, sodium chloride flush  Family History    Family History  Problem Relation Age of Onset  . Hypertension Mother   . Cancer Mother   . Hypertension Father   . Diabetes Sister   . Cancer Sister   . Cancer Sister     Social History    Social History   Social History  . Marital status: Single    Spouse name: N/A  . Number of children: 3  . Years of education: 26 th   Occupational History  . disabled    Social History Main Topics  . Smoking status: Former Smoker    Packs/day: 0.25    Types: Cigarettes  . Smokeless tobacco: Never Used  . Alcohol use No     Comment: quit drinking 11/2011 per patient  . Drug use: No  . Sexual activity: Not Currently   Other Topics Concern  . Not on file   Social History Narrative   Patient is single and lives at home alone.   Disabled.   Education 11 th grade.   Right handed.   Caffeine none .     Review of Systems    General:  No chills, fever, night sweats or weight changes.  Cardiovascular:  No dyspnea on exertion, edema, orthopnea,  paroxysmal nocturnal dyspnea. Positive for palpitations and  chest pain.  Dermatological: No rash, lesions/masses Respiratory: No cough, dyspnea Urologic: No hematuria, dysuria Abdominal:   No nausea, vomiting, diarrhea, bright red blood per rectum, melena, or hematemesis Neurologic:  No visual changes, wkns, changes in mental status.  All other systems reviewed and are otherwise negative except as noted above.  Physical Exam/Data    Blood pressure 108/62, pulse 95, temperature 98.4 F (36.9 C), temperature source Oral, resp. rate 20, height 6\' 6"  (1.981 m), weight 232 lb 5.8 oz (105.4 kg), SpO2 97 %.  General: Pleasant, African American male appearing  in NAD Psych: Normal affect. Neuro: Alert and oriented X 3. Moves all extremities spontaneously. HEENT: Normal  Neck: Supple without bruits or JVD. Lungs:  Resp regular and unlabored, CTA without wheezing or rales. Heart: Irregularly irregular no s3, s4, or murmurs. Abdomen: Soft, non-tender, non-distended, BS + x 4.  Extremities: No clubbing, cyanosis or edema. DP/PT/Radials 2+ and equal bilaterally.   EKG:  The EKG was personally reviewed and demonstrates:  Atrial flutter, HR 96.   Labs/Studies     Relevant CV Studies:  Echocardiogram: 09/2014 Study Conclusions  - Left ventricle: The cavity size was normal. There was mild   concentric hypertrophy. Systolic function was normal. The   estimated ejection fraction was in the range of 55% to 60%.   Doppler parameters are consistent with high ventricular filling   pressure. 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 - Right atrium: The atrium was mildly dilated. - Atrial septum: No defect or patent foramen ovale was identified.  Laboratory Data:  Chemistry  Recent Labs Lab 01/10/17 0747 01/11/17 0037  NA 137 136  K 4.0 4.0  CL 107 107  CO2 20* 21*  GLUCOSE 125* 97  BUN 14 13  CREATININE 0.97 0.82  CALCIUM 8.6* 8.4*  GFRNONAA >60 >60  GFRAA >60 >60  ANIONGAP 10 8     Recent Labs Lab 01/10/17 0747  PROT 7.8    ALBUMIN 3.2*  AST 25  ALT 16*  ALKPHOS 71  BILITOT 0.8   Hematology  Recent Labs Lab 01/10/17 0747 01/11/17 0037  WBC 13.1* 9.1  RBC 4.22 3.92*  HGB 14.5 13.2  HCT 41.3 38.4*  MCV 97.9 98.0  MCH 34.4* 33.7  MCHC 35.1 34.4  RDW 12.2 12.4  PLT 300 259   Cardiac Enzymes  Recent Labs Lab 01/10/17 1324 01/10/17 1822 01/11/17 0037 01/11/17 0929  TROPONINI <0.03 <0.03 <0.03 <0.03     Recent Labs Lab 01/10/17 0908  TROPIPOC 0.03    BNPNo results for input(s): BNP, PROBNP in the last 168 hours.  DDimer No results for input(s): DDIMER in the last 168 hours.  Radiology/Studies:  Ct Head Wo Contrast  Result Date: 01/10/2017 CLINICAL DATA:  Pain following fall EXAM: CT HEAD WITHOUT CONTRAST CT CERVICAL SPINE WITHOUT CONTRAST TECHNIQUE: Multidetector CT imaging of the head and cervical spine was performed following the standard protocol without intravenous contrast. Multiplanar CT image reconstructions of the cervical spine were also generated. COMPARISON:  Brain MRI November 03, 2014; head CT and cervical spine CT October 31, 2014 FINDINGS: CT HEAD FINDINGS Brain: Moderate diffuse atrophy is stable. There is no intracranial mass, hemorrhage, extra-axial fluid collection, or midline shift. 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 Elsewhere there is patchy small vessel disease in the centra semiovale bilaterally. No new gray-white compartment lesions evident. No evidence of acute infarct. Air is noted to the right of the sella, chronic and stable from the 2016 study. Vascular: There is no hyperdense vessel. There is calcification in the carotid siphon regions. Skull: Bony calvarium appears intact. Sinuses/Orbits: Old nasal fractures appear stable. There is evidence of old trauma involving the medial left orbital wall, stable. There is mucosal thickening in both maxillary antra as well as in multiple ethmoid air cells. Other visualized  paranasal sinuses are clear. No intraorbital lesions are evident. Other: Mastoid air cells are clear. CT CERVICAL SPINE FINDINGS Alignment: There is no appreciable spondylolisthesis. Skull base and vertebrae: Skull base and  craniocervical junction regions appear normal. There is no acute fracture. No blastic or lytic bone lesions evident. Soft tissues and spinal canal: Prevertebral soft tissues and predental space regions are normal. There are no paraspinous lesions. There is no evident cord or canal hematoma. Disc levels: There is moderate disc space narrowing at C4-5. There is somewhat more severe disc space narrowing at C5-6, C6-7, and C7-T1. There are prominent anterior osteophytes at C4, C5, C6, and to a slightly lesser extent at C7. There is facet hypertrophy at multiple levels. There is exit foraminal narrowing due to bony hypertrophy on the left at C3-4, at C4-5 bilaterally, and at C6-7 bilaterally. No frank disc extrusion or stenosis. Upper chest: Visualized upper lung zones are clear. Other: There are foci of calcification in both subclavian arteries as well as in both carotid arteries. IMPRESSION: CT head: Stable moderate diffuse atrophy, slightly more pronounced involving the cerebellum than elsewhere. 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 No acute infarct. There is patchy periventricular small vessel disease. No mass, hemorrhage, or extra-axial fluid collection. Areas present to the right of the sella, chronic and stable. There appears to be a connection between the posterior sphenoid sinus in this area, possibly residua of old trauma. There are areas of prior nasal trauma and trauma involving the medial left orbital wall. There are foci of paranasal sinus disease currently. There are foci of arterial vascular calcification. CT cervical spine: No fracture or spondylolisthesis. Multilevel osteoarthritic change present. There are foci of calcification in both carotid  and subclavian arteries. Electronically Signed   By: Bretta Bang III M.D.   On: 01/10/2017 10:54   Ct Cervical Spine Wo Contrast  Result Date: 01/10/2017 CLINICAL DATA:  Pain following fall EXAM: CT HEAD WITHOUT CONTRAST CT CERVICAL SPINE WITHOUT CONTRAST TECHNIQUE: Multidetector CT imaging of the head and cervical spine was performed following the standard protocol without intravenous contrast. Multiplanar CT image reconstructions of the cervical spine were also generated. COMPARISON:  Brain MRI November 03, 2014; head CT and cervical spine CT October 31, 2014 FINDINGS: CT HEAD FINDINGS Brain: Moderate diffuse atrophy is stable. There is no intracranial mass, hemorrhage, extra-axial fluid collection, or midline shift. 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 Elsewhere there is patchy small vessel disease in the centra semiovale bilaterally. No new gray-white compartment lesions evident. No evidence of acute infarct. Air is noted to the right of the sella, chronic and stable from the 2016 study. Vascular: There is no hyperdense vessel. There is calcification in the carotid siphon regions. Skull: Bony calvarium appears intact. Sinuses/Orbits: Old nasal fractures appear stable. There is evidence of old trauma involving the medial left orbital wall, stable. There is mucosal thickening in both maxillary antra as well as in multiple ethmoid air cells. Other visualized paranasal sinuses are clear. No intraorbital lesions are evident. Other: Mastoid air cells are clear. CT CERVICAL SPINE FINDINGS Alignment: There is no appreciable spondylolisthesis. Skull base and vertebrae: Skull base and craniocervical junction regions appear normal. There is no acute fracture. No blastic or lytic bone lesions evident. Soft tissues and spinal canal: Prevertebral soft tissues and predental space regions are normal. There are no paraspinous lesions. There is no evident cord or canal  hematoma. Disc levels: There is moderate disc space narrowing at C4-5. There is somewhat more severe disc space narrowing at C5-6, C6-7, and C7-T1. There are prominent anterior osteophytes at C4,  C5, C6, and to a slightly lesser extent at C7. There is facet hypertrophy at multiple levels. There is exit foraminal narrowing due to bony hypertrophy on the left at C3-4, at C4-5 bilaterally, and at C6-7 bilaterally. No frank disc extrusion or stenosis. Upper chest: Visualized upper lung zones are clear. Other: There are foci of calcification in both subclavian arteries as well as in both carotid arteries. IMPRESSION: CT head: Stable moderate diffuse atrophy, slightly more pronounced involving the cerebellum than elsewhere. 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 No acute infarct. There is patchy periventricular small vessel disease. No mass, hemorrhage, or extra-axial fluid collection. Areas present to the right of the sella, chronic and stable. There appears to be a connection between the posterior sphenoid sinus in this area, possibly residua of old trauma. There are areas of prior nasal trauma and trauma involving the medial left orbital wall. There are foci of paranasal sinus disease currently. There are foci of arterial vascular calcification. CT cervical spine: No fracture or spondylolisthesis. Multilevel osteoarthritic change present. There are foci of calcification in both carotid and subclavian arteries. Electronically Signed   By: Bretta Bang III M.D.   On: 01/10/2017 10:54   Dg Chest Portable 1 View  Result Date: 01/10/2017 CLINICAL DATA:  Pain following fall.  Supraventricular tachycardia EXAM: PORTABLE CHEST 1 VIEW COMPARISON:  November 04, 2014 FINDINGS: There is no edema or consolidation. Heart is mildly enlarged with pulmonary vascularity within normal limits. No adenopathy. There is aortic atherosclerosis. No pneumothorax. There is evidence of old rib trauma  on the right, unchanged. IMPRESSION: Stable cardiac prominence. Aortic atherosclerosis. No edema or consolidation. Aortic Atherosclerosis (ICD10-I70.0). Electronically Signed   By: Bretta Bang III M.D.   On: 01/10/2017 09:16     Assessment & Plan    1. New-onset Atrial Flutter with RVR - patient experienced an episode of diaphoresis, generalized shaking, and LOC yesterday which he accredited to his known seizure disorder. Upon EMS arrival, his HR was in the low-200's but was given Adenosine (initially 6mg  then 12mg ) with no change in his rhythm. Was started on IV Diltiazem while in the ED with improvement in his HR into the low-100's and EKG confirmed atrial flutter. TSH and K+ are within normal limits. Will check Mg.  - he remains in atrial flutter with HR in the low-100's at this time. Continue IV Cardizem for rate-control.  - This patients CHA2DS2-VASc Score and unadjusted Ischemic Stroke Rate (% per year) is equal to 3.2 % stroke rate/year from a score of 3 (HTN, CVA (2)). Not a good long-term anticoagulation candidate due to his frequent falls. Will discuss further with Dr. Royann Shivers but possible ablation candidate with his typical flutter.   2. Atypical Chest Pain - reports having episodic chest pain since his "seizure" yesterday. Pain can last for 5-10 minutes then spontaneously resolves. No association with exertion. No prior cardiac history.  - initial troponin negative with cyclic enzymes pending.  - an echocardiogram has been ordered to assess LV function and wall motion.   3. Seizure Disorder - reports having 3 "seizures" within the past 3 days with prodromal symptoms of diaphoresis and "shaking all over". His diaphoresis and LOC could possibly be secondary to his arrhythmia. - continue Keppra.  - further evaluation per admitting team.   4. History of Alcohol Abuse - reports he quit drinking 1 year ago. Ethanol level < 10 this admission. - continued cessation advised.  Signed, Ellsworth LennoxBrittany M Strader, PA-C 01/11/2017, 10:40 AM Pager: [phone]  I have seen and examined the patient along with Ellsworth LennoxBrittany M Strader, PA-C.  I have reviewed the chart, notes and new data.  I agree with PA's note.  Key new complaints: not a great historian. More preoccupied by swelling due to "infection" in his foot and once his antibiotics started. His "seizures" could be just that, but I suspect that he is actually experiencing syncope. All 3 episodes have happened after he stood up suddenly. His "shaking" sounds more like palpitations. Does not have daytime hypersomnolence, does have insomnia and snores. Smoker, but not diagnosed w COPD. Not using bronchodilators. Key examination changes: small frontal laceration, right scalp; rapid regular rhythm, otherwise normal cardiovascular exam Key new findings / data: bedside revealed echo shows normal left heart chamber size and normal LVEF. The right ventricle is only borderline dilated, but the right atrium is probably moderately dilated. There is normal right ventricular systolic function. The pulmonary artery pressure could not be estimated. There is no clear evidence of ASD on transthoracic images.he has borderline findings for mitral valve prolapse and probably only mild mitral insufficiency. The main pulmonary artery appears qualitatively dilated, was unable to measure on bedside review. All his electrocardiograms show a similar atrial arrhythmia mechanism, which is probably typical counterclockwise right atrial flutter, cavotricuspid isthmus-dependent, but remarkably long cycle length, possibly due to the right atrial dilation.  PLAN: Currently with borderline ventricular rate controlled on intravenous diltiazem and asymptomatic. I think he is best suited for early ablation of the atrial flutter.  AFlutter with 1:1 AV conduction may have actually caused his syncope. I don't think he is a good candidate for long-term anticoagulation or  antiarrhythmic medications due to questionable compliance risk of fall/injury, use of medications that prolong the QT interval. Will ask for EP evaluation. Would need a TEE before RF ablation tomorrow. Will also need a workup for cause of RA dilation  - should readily diagnose ASD on TEE, may need sleep study and PFTs.  Thurmon FairMihai Aliah Eriksson, MD, [facility] Center[facility]ACC [facility] [phone] 01/11/2017, 11:07 AM

## 2017-01-11 NOTE — Progress Notes (Signed)
PROGRESS NOTE    [name] [name]  ZOX:[id number] DOB: 09/11/1960 DOA: 01/10/2017 PCP: MortimerNena Zhao Friesurl, David, PA   Brief Narrative:  56 y.o. BM PMHx EtOH abuse, Dementia, Seizure disorder, Hep c, HTN, Hereditary and idiopathic peripheral neuropathy, aneurysm [facility]   Comes in with syncopal episode at his group home  Pt reports he "just fell out".  He is a very poor historian.  Denies any fevers.  No recent illnesses.  Earlier he was having a lot of chest pressure and his heart rate was 200 when ems arrived.  He was given adenosine twice with no results.  In ED given cardizem bolus and drip and rate dropped to 110 range and was actually aflutter which is new for pt.  Pt feels much better now that rate is down and his chest pressure is resolved.      Subjective: 10/24 A/O 4, positive CP substernal has decreased to 7/10, negative SOB, negative radiation. Negative N/V   Assessment & Plan:   Principal Problem:   Atrial flutter with rapid ventricular response (HCC) Active Problems:   Chronic liver disease   Chronic hepatitis C without hepatic coma (HCC)   Convulsions/seizures (HCC)   S/P cerebral aneurysm repair   Dementia with behavioral disturbance   Chronic pain syndrome   Syncope   A flutter with RVR -Currently sinus tachycardia: Change from yesterday which was a flutter with 2-1 AV block. -ASA 81mg  daily -Continue diltiazem drip -Echocardiogram pending -initial troponin is negativeTroponins pending      Recent Labs Lab 01/10/17 1324 01/10/17 1822 01/11/17 0037  TROPONINI <0.03 <0.03 <0.03  -Cardiology consulted  Chest pain/unstable angina -NTG 2 decrease painfrom 9/10---> 7/10 -Morphine 2 mg 1   EtOH abuse -UDS pending -Ethanol level pending.  Seizure disorder -keppra 750 mg BID -Keppra level pending  Dementia -Baseline?  Aneurysm -Negative neurological deficits on exam. [facility]       DVT prophylaxis:  heparin drip Code Status: full Family Communication: none Disposition Plan: TBD   Consultants:  cardiology  Procedures/Significant Events:  Echocardiogram pending   I have personally reviewed and interpreted all radiology studies and my findings are as above.  VENTILATOR SETTINGS:    Cultures none  Antimicrobials: none   Devices none   LINES / TUBES:  none   Continuous Infusions: . sodium chloride    . diltiazem (CARDIZEM) infusion 5 mg/hr (01/11/17 0030)     Objective: Vitals:   01/11/17 0400 01/11/17 0500 01/11/17 0600 01/11/17 0747  BP: 107/77 (!) 93/38 107/74 108/62  Pulse:    95  Resp: (!) 24 (!) 26 (!) 26 20  Temp:    98.4 F (36.9 C)  TempSrc:    Oral  SpO2:    97%  Weight:      Height:        Intake/Output Summary (Last 24 hours) at 01/11/17 0756 Last data filed at 01/11/17 0600  Gross per 24 hour  Intake              150 ml  Output              400 ml  Net             -250 ml   Filed Weights   01/10/17 1750  Weight: 232 lb 5.8 oz (105.4 kg)    Examination:  General: A/O 4,No acute respiratory distress Eyes: negative scleral hemorrhage, negative anisocoria, negative icterus ENT: Negative Runny nose, negative  gingival bleeding,poor dentation Neck:  Negative scars, masses, torticollis, lymphadenopathy, JVD Lungs: Clear to auscultation bilaterally without wheezes or crackles Cardiovascular: tachycardic,Regular rhythm without murmur gallop or rub normal S1 and S2 Abdomen: negative abdominal pain, nondistended, positive soft, bowel sounds, no rebound, no ascites, no appreciable mass Extremities: No significant cyanosis, clubbing, or edema bilateral lower extremities Skin: Negative rashes, lesions, ulcers Psychiatric:  Negative depression, negative anxiety, negative fatigue, negative mania  Central nervous system:  Cranial nerves II through XII intact, tongue/uvula midline, all extremities muscle strength 5/5, sensation intact  throughout,  negative dysarthria, negative expressive aphasia, negative receptive aphasia.  .     Data Reviewed: Care during the described time interval was provided by me .  I have reviewed this patient's available data, including medical history, events of note, physical examination, and all test results as part of my evaluation.   CBC:  Recent Labs Lab 01/10/17 0747 01/11/17 0037  WBC 13.1* 9.1  NEUTROABS 5.8  --   HGB 14.5 13.2  HCT 41.3 38.4*  MCV 97.9 98.0  PLT 300 259   Basic Metabolic Panel:  Recent Labs Lab 01/10/17 0747 01/11/17 0037  NA 137 136  K 4.0 4.0  CL 107 107  CO2 20* 21*  GLUCOSE 125* 97  BUN 14 13  CREATININE 0.97 0.82  CALCIUM 8.6* 8.4*   GFR: Estimated Creatinine Clearance: 126.9 mL/min (by C-G formula based on SCr of 0.82 mg/dL). Liver Function Tests:  Recent Labs Lab 01/10/17 0747  AST 25  ALT 16*  ALKPHOS 71  BILITOT 0.8  PROT 7.8  ALBUMIN 3.2*   No results for input(s): LIPASE, AMYLASE in the last 168 hours. No results for input(s): AMMONIA in the last 168 hours. Coagulation Profile: No results for input(s): INR, PROTIME in the last 168 hours. Cardiac Enzymes:  Recent Labs Lab 01/10/17 1324 01/10/17 1822 01/11/17 0037  TROPONINI <0.03 <0.03 <0.03   BNP (last 3 results) No results for input(s): PROBNP in the last 8760 hours. HbA1C: No results for input(s): HGBA1C in the last 72 hours. CBG: No results for input(s): GLUCAP in the last 168 hours. Lipid Profile: No results for input(s): CHOL, HDL, LDLCALC, TRIG, CHOLHDL, LDLDIRECT in the last 72 hours. Thyroid Function Tests:  Recent Labs  01/10/17 1822  TSH 1.506   Anemia Panel: No results for input(s): VITAMINB12, FOLATE, FERRITIN, TIBC, IRON, RETICCTPCT in the last 72 hours. Urine analysis:    Component Value Date/Time   COLORURINE YELLOW 10/31/2014 1840   APPEARANCEUR CLEAR 10/31/2014 1840   LABSPEC 1.007 10/31/2014 1840   PHURINE 7.0 10/31/2014 1840    GLUCOSEU NEGATIVE 10/31/2014 1840   HGBUR NEGATIVE 10/31/2014 1840   BILIRUBINUR Small 10/19/2015 1526   KETONESUR NEGATIVE 10/31/2014 1840   PROTEINUR Negative 10/19/2015 1526   PROTEINUR NEGATIVE 10/31/2014 1840   UROBILINOGEN >=8.0 10/19/2015 1526   UROBILINOGEN 0.2 10/31/2014 1840   NITRITE Negative 10/19/2015 1526   NITRITE NEGATIVE 10/31/2014 1840   LEUKOCYTESUR Negative 10/19/2015 1526   Sepsis Labs: @LABRCNTIP (procalcitonin:4,lacticidven:4)  ) Recent Results (from the past 240 hour(s))  MRSA PCR Screening     Status: None   Collection Time: 01/10/17  5:57 PM  Result Value Ref Range Status   MRSA by PCR NEGATIVE NEGATIVE Final    Comment:        The GeneXpert MRSA Assay (FDA approved for NASAL specimens only), is one component of a comprehensive MRSA colonization surveillance program. It is not intended to diagnose MRSA infection nor to guide  or monitor treatment for MRSA infections.          Radiology Studies: Ct Head Wo Contrast  Result Date: 01/10/2017 CLINICAL DATA:  Pain following fall EXAM: CT HEAD WITHOUT CONTRAST CT CERVICAL SPINE WITHOUT CONTRAST TECHNIQUE: Multidetector CT imaging of the head and cervical spine was performed following the standard protocol without intravenous contrast. Multiplanar CT image reconstructions of the cervical spine were also generated. COMPARISON:  Brain MRI November 03, 2014; head CT and cervical spine CT October 31, 2014 FINDINGS: CT HEAD FINDINGS Brain: Moderate diffuse atrophy is stable. There is no intracranial mass, hemorrhage, extra-axial fluid collection, or midline shift. 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 Elsewhere there is patchy small vessel disease in the centra semiovale bilaterally. No new gray-white compartment lesions evident. No evidence of acute infarct. Air is noted to the right of the sella, chronic and stable from the 2016 study. Vascular: There is no  hyperdense vessel. There is calcification in the carotid siphon regions. Skull: Bony calvarium appears intact. Sinuses/Orbits: Old nasal fractures appear stable. There is evidence of old trauma involving the medial left orbital wall, stable. There is mucosal thickening in both maxillary antra as well as in multiple ethmoid air cells. Other visualized paranasal sinuses are clear. No intraorbital lesions are evident. Other: Mastoid air cells are clear. CT CERVICAL SPINE FINDINGS Alignment: There is no appreciable spondylolisthesis. Skull base and vertebrae: Skull base and craniocervical junction regions appear normal. There is no acute fracture. No blastic or lytic bone lesions evident. Soft tissues and spinal canal: Prevertebral soft tissues and predental space regions are normal. There are no paraspinous lesions. There is no evident cord or canal hematoma. Disc levels: There is moderate disc space narrowing at C4-5. There is somewhat more severe disc space narrowing at C5-6, C6-7, and C7-T1. There are prominent anterior osteophytes at C4, C5, C6, and to a slightly lesser extent at C7. There is facet hypertrophy at multiple levels. There is exit foraminal narrowing due to bony hypertrophy on the left at C3-4, at C4-5 bilaterally, and at C6-7 bilaterally. No frank disc extrusion or stenosis. Upper chest: Visualized upper lung zones are clear. Other: There are foci of calcification in both subclavian arteries as well as in both carotid arteries. IMPRESSION: CT head: Stable moderate diffuse atrophy, slightly more pronounced involving the cerebellum than elsewhere. 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 No acute infarct. There is patchy periventricular small vessel disease. No mass, hemorrhage, or extra-axial fluid collection. Areas present to the right of the sella, chronic and stable. There appears to be a connection between the posterior sphenoid sinus in this area, possibly  residua of old trauma. There are areas of prior nasal trauma and trauma involving the medial left orbital wall. There are foci of paranasal sinus disease currently. There are foci of arterial vascular calcification. CT cervical spine: No fracture or spondylolisthesis. Multilevel osteoarthritic change present. There are foci of calcification in both carotid and subclavian arteries. Electronically Signed   By: Bretta Bang III M.D.   On: 01/10/2017 10:54   Ct Cervical Spine Wo Contrast  Result Date: 01/10/2017 CLINICAL DATA:  Pain following fall EXAM: CT HEAD WITHOUT CONTRAST CT CERVICAL SPINE WITHOUT CONTRAST TECHNIQUE: Multidetector CT imaging of the head and cervical spine was performed following the standard protocol without intravenous contrast. Multiplanar CT image reconstructions of the cervical spine were also generated. COMPARISON:  Brain  MRI November 03, 2014; head CT and cervical spine CT October 31, 2014 FINDINGS: CT HEAD FINDINGS Brain: Moderate diffuse atrophy is stable. There is no intracranial mass, hemorrhage, extra-axial fluid collection, or midline shift. 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 Elsewhere there is patchy small vessel disease in the centra semiovale bilaterally. No new gray-white compartment lesions evident. No evidence of acute infarct. Air is noted to the right of the sella, chronic and stable from the 2016 study. Vascular: There is no hyperdense vessel. There is calcification in the carotid siphon regions. Skull: Bony calvarium appears intact. Sinuses/Orbits: Old nasal fractures appear stable. There is evidence of old trauma involving the medial left orbital wall, stable. There is mucosal thickening in both maxillary antra as well as in multiple ethmoid air cells. Other visualized paranasal sinuses are clear. No intraorbital lesions are evident. Other: Mastoid air cells are clear. CT CERVICAL SPINE FINDINGS Alignment: There is  no appreciable spondylolisthesis. Skull base and vertebrae: Skull base and craniocervical junction regions appear normal. There is no acute fracture. No blastic or lytic bone lesions evident. Soft tissues and spinal canal: Prevertebral soft tissues and predental space regions are normal. There are no paraspinous lesions. There is no evident cord or canal hematoma. Disc levels: There is moderate disc space narrowing at C4-5. There is somewhat more severe disc space narrowing at C5-6, C6-7, and C7-T1. There are prominent anterior osteophytes at C4, C5, C6, and to a slightly lesser extent at C7. There is facet hypertrophy at multiple levels. There is exit foraminal narrowing due to bony hypertrophy on the left at C3-4, at C4-5 bilaterally, and at C6-7 bilaterally. No frank disc extrusion or stenosis. Upper chest: Visualized upper lung zones are clear. Other: There are foci of calcification in both subclavian arteries as well as in both carotid arteries. IMPRESSION: CT head: Stable moderate diffuse atrophy, slightly more pronounced involving the cerebellum than elsewhere. 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 No acute infarct. There is patchy periventricular small vessel disease. No mass, hemorrhage, or extra-axial fluid collection. Areas present to the right of the sella, chronic and stable. There appears to be a connection between the posterior sphenoid sinus in this area, possibly residua of old trauma. There are areas of prior nasal trauma and trauma involving the medial left orbital wall. There are foci of paranasal sinus disease currently. There are foci of arterial vascular calcification. CT cervical spine: No fracture or spondylolisthesis. Multilevel osteoarthritic change present. There are foci of calcification in both carotid and subclavian arteries. Electronically Signed   By: Bretta Bang III M.D.   On: 01/10/2017 10:54   Dg Chest Portable 1 View  Result Date:  01/10/2017 CLINICAL DATA:  Pain following fall.  Supraventricular tachycardia EXAM: PORTABLE CHEST 1 VIEW COMPARISON:  November 04, 2014 FINDINGS: There is no edema or consolidation. Heart is mildly enlarged with pulmonary vascularity within normal limits. No adenopathy. There is aortic atherosclerosis. No pneumothorax. There is evidence of old rib trauma on the right, unchanged. IMPRESSION: Stable cardiac prominence. Aortic atherosclerosis. No edema or consolidation. Aortic Atherosclerosis (ICD10-I70.0). Electronically Signed   By: Bretta Bang III M.D.   On: 01/10/2017 09:16        Scheduled Meds: . acetaminophen  1,000 mg Oral BID  . aspirin EC  81 mg Oral Daily  . cholecalciferol  1,000 Units Oral Daily  . diltiazem  60 mg Oral  Q8H  . levETIRAcetam  750 mg Oral BID  . Melatonin  3 mg Oral QHS  . QUEtiapine  50 mg Oral Daily  . sertraline  100 mg Oral Daily  . sodium chloride flush  3 mL Intravenous Q12H  . zolpidem  10 mg Oral QHS   Continuous Infusions: . sodium chloride    . diltiazem (CARDIZEM) infusion 5 mg/hr (01/11/17 0030)     LOS: 0 days    Time spent: 40 minutes    Velta Rockholt, Roselind Messier, MD [facility] Pager [phone]   If 7PM-7AM, please contact night-coverage [email or web address] Password [email or web address] 01/11/2017, 7:56 AM

## 2017-01-11 NOTE — Progress Notes (Signed)
Scheduled for TEE tomorrow 0800h w Dr. Nishan, in anticipation of possible RF ablation for A Flutter. MCr 

## 2017-01-11 NOTE — Clinical Social Work Note (Signed)
Clinical Social Work Assessment  Patient Details  Name: [name] [name] MRN: [id number] Date of Birth: [birth year]  Date of referral:  01/11/17               Reason for consult:  Discharge Planning                Permission sought to share information with:  Chartered certified accountant granted to share information::  Yes, Verbal Permission Granted  Name::        Agency::  [name]  Relationship::     Contact Information:     Housing/Transportation Living arrangements for the past 2 months:  [place] of Information:  Patient, Medical Team, Facility Patient Interpreter Needed:  None Criminal Activity/Legal Involvement Pertinent to Current Situation/Hospitalization:  No - Comment as needed Significant Relationships:  Adult Children Lives with:  Facility Resident Do you feel safe going back to the place where you live?  Yes Need for family participation in patient care:  Yes (Comment)  Care giving concerns:  Patient is from [name].   Social Worker assessment / plan:  CSW met with patient. No supports at bedside. CSW introduced role and explained that discharge planning would be discussed. Patient confirmed that he is from [place]. He has difficulty remembering the name of the facility. Patient went into a long discussion about his experience at the facility, his medical issues, and family life. Patient is agreeable to returning to [name] once stable for discharge. No further concerns. CSW encouraged patient to contact CSW as needed. CSW will continue to follow patient for support and facilitate discharge to [name] once medically stable.  Employment status:  Unemployed Forensic scientist:  Medicaid In [place] PT Recommendations:  Not assessed at this time Information / Referral to community resources:  Other (Comment Required) (Patient will return to [name].)  Patient/Family's Response to care:  Patient agreeable to return  to [name]. Patient's sons supportive and involved in patient's care. Patient appreciated social work intervention.  Patient/Family's Understanding of and Emotional Response to Diagnosis, Current Treatment, and Prognosis:  Patient has a good understanding of the reason for admission and his need to return to [name] once medically stable. Patient appears happy with hospital care.  Emotional Assessment Appearance:  Appears stated age Attitude/Demeanor/Rapport:  Other (Pleasant) Affect (typically observed):  Appropriate, Pleasant Orientation:  Oriented to Self, Oriented to Place, Oriented to  Time, Oriented to Situation Alcohol / Substance use:  Tobacco Use Psych involvement (Current and /or in the community):  No (Comment)  Discharge Needs  Concerns to be addressed:  Care Coordination Readmission within the last 30 days:  No Current discharge risk:  None Barriers to Discharge:  Continued Medical Work up   Candie Chroman, LCSW 01/11/2017, 1:44 PM

## 2017-01-12 ENCOUNTER — Encounter (HOSPITAL_COMMUNITY): Payer: Self-pay | Admitting: *Deleted

## 2017-01-12 ENCOUNTER — Encounter (HOSPITAL_COMMUNITY)
Admission: EM | Disposition: A | Discharge status: Skilled Nursing Facility | Payer: Self-pay | Source: Home / Self Care | Attending: Internal Medicine

## 2017-01-12 ENCOUNTER — Inpatient Hospital Stay (HOSPITAL_COMMUNITY): Payer: Medicaid Other

## 2017-01-12 DIAGNOSIS — I200 Unstable angina: Secondary | ICD-10-CM

## 2017-01-12 DIAGNOSIS — I4891 Unspecified atrial fibrillation: Secondary | ICD-10-CM

## 2017-01-12 DIAGNOSIS — F0150 Vascular dementia without behavioral disturbance: Secondary | ICD-10-CM

## 2017-01-12 DIAGNOSIS — F1910 Other psychoactive substance abuse, uncomplicated: Secondary | ICD-10-CM

## 2017-01-12 LAB — CBC
HCT: 38.2 % — ABNORMAL LOW (ref 39.0–52.0)
Hemoglobin: 13.1 g/dL (ref 13.0–17.0)
MCH: 33.5 pg (ref 26.0–34.0)
MCHC: 34.3 g/dL (ref 30.0–36.0)
MCV: 97.7 fL (ref 78.0–100.0)
PLATELETS: 274 10*3/uL (ref 150–400)
RBC: 3.91 MIL/uL — ABNORMAL LOW (ref 4.22–5.81)
RDW: 12.1 % (ref 11.5–15.5)
WBC: 8.2 10*3/uL (ref 4.0–10.5)

## 2017-01-12 LAB — LEVETIRACETAM LEVEL: Levetiracetam Lvl: 25.4 ug/mL (ref 10.0–40.0)

## 2017-01-12 LAB — MAGNESIUM: MAGNESIUM: 1.9 mg/dL (ref 1.7–2.4)

## 2017-01-12 SURGERY — INVASIVE LAB ABORTED CASE

## 2017-01-12 SURGERY — A-FLUTTER ABLATION
Anesthesia: Monitor Anesthesia Care

## 2017-01-12 MED ORDER — DILTIAZEM HCL ER COATED BEADS 360 MG PO CP24: 360.0000 mg | ORAL_CAPSULE | Freq: Every day | ORAL | Status: DC

## 2017-01-12 MED ORDER — MIDAZOLAM HCL 10 MG/2ML IJ SOLN
INTRAMUSCULAR | Status: DC | PRN
  Administered 2017-01-12 (×2): 2 mg via INTRAVENOUS
  Administered 2017-01-12: 1 mg via INTRAVENOUS
  Administered 2017-01-12: 2 mg via INTRAVENOUS

## 2017-01-12 MED ORDER — FENTANYL CITRATE (PF) 100 MCG/2ML IJ SOLN
INTRAMUSCULAR | Status: DC | PRN
  Administered 2017-01-12 (×3): 25 ug via INTRAVENOUS

## 2017-01-12 MED ORDER — MIDAZOLAM HCL 5 MG/ML IJ SOLN
INTRAMUSCULAR | Status: AC
  Filled 2017-01-12: qty 2

## 2017-01-12 MED ORDER — BUTAMBEN-TETRACAINE-BENZOCAINE 2-2-14 % EX AERO
INHALATION_SPRAY | CUTANEOUS | Status: DC | PRN
  Administered 2017-01-12: 2 via TOPICAL

## 2017-01-12 MED ORDER — DILTIAZEM HCL ER COATED BEADS 180 MG PO CP24
180.0000 mg | ORAL_CAPSULE | Freq: Every day | ORAL | Status: DC
  Administered 2017-01-12 – 2017-01-15 (×4): 180 mg via ORAL
  Filled 2017-01-12 (×4): qty 1

## 2017-01-12 MED ORDER — FENTANYL CITRATE (PF) 100 MCG/2ML IJ SOLN
INTRAMUSCULAR | Status: AC
  Filled 2017-01-12: qty 2

## 2017-01-12 MED ORDER — SODIUM CHLORIDE 0.9 % IV SOLN
INTRAVENOUS | Status: DC
  Administered 2017-01-12: 04:00:00 via INTRAVENOUS

## 2017-01-12 NOTE — Progress Notes (Signed)
PROGRESS NOTE    [name] [name]  OZH:[id number] DOB: [birth year] DOA: 01/10/2017 PCP: Mortimer Fries, PA   Brief Narrative:  57 y.o. BM PMHx Bipolar, EtOH abuse, Dementia, Seizure disorder, Hep c, HTN, Hereditary and idiopathic peripheral neuropathy, aneurysm S/P surgery at [facility]  Comes in with syncopal episode at his group home  Pt reports he "just fell out".  He is a very poor historian.  Denies any fevers.  No recent illnesses.  Earlier he was having a lot of chest pressure and his heart rate was 200 when ems arrived.  He was given adenosine twice with no results.  In ED given cardizem bolus and drip and rate dropped to 110 range and was actually aflutter which is new for pt.  Pt feels much better now that rate is down and his chest pressure is resolved.      Subjective: 10/25 A/O 4, negative CP, negative SOB, negative abdominal pain. Believes were going to cut his chest open.     Assessment & Plan:   Principal Problem:   Atrial flutter with rapid ventricular response (HCC) Active Problems:   Chronic liver disease   Chronic hepatitis C without hepatic coma (HCC)   Convulsions/seizures (HCC)   S/P cerebral aneurysm repair   Dementia with behavioral disturbance   Chronic pain syndrome   Syncope   A flutter with RVR -yesterday was a flutter with 2-1 AV block. Currently sinus tachycardia -ASA 81mg  daily -Continue diltiazem drip -Echocardiogram: Moderate LVH see results below -Attempted TEE today unsuccessfully. Will have to start patient on anticoagulant for 4 weeks. Patient a very poor candidate for long-term anticoagulation given his mental health issues, substance abuse. -Eliquis 5 mg BID -Cardizem 180 mg daily -NTG PRN -Follow-up appointment in 4 weeks with Dr. Sherryl Manges Cardiology for DCCV. 11/29 -Troponins negative     Recent Labs Lab 01/10/17 1324 01/10/17 1822 01/11/17 0037 01/11/17 0929 01/11/17 1451 01/11/17 2047  TROPONINI <0.03 <0.03 <0.03 <0.03  <0.03 <0.03   Chest pain/unstable angina -See A- flutter  EtOH abuse -UDS positive opioids (not in patient's home medication) -Ethanol level negative -Patient counseled at length on not using alcohol or other illegal substances when on anticoagulant  Seizure disorder -keppra 750 mg BID -Keppra level pending  Dementia/bipolar (per patient) -Baseline?  Aneurysm -Negative neurological deficits on exam. Repair 2006 at [facility] patient  Chronic hepatitis C     DVT prophylaxis: Eliquis Code Status: full Family Communication: none Disposition Plan: TBD   Consultants:  cardiology  Procedures/Significant Events:  10/24 Echocardiogram: Left ventricle: The cavity size was normal. Wall thickness was   increased in a pattern of moderate LVH. Systolic function was   normal. The estimated ejection fraction was in the range of 55%   to 60%. Wall motion was normal; there were no regional wall   motion abnormalities. The study is not technically sufficient to   allow evaluation of LV diastolic function. - Mitral valve: There was mild regurgitation. - Right ventricle: The cavity size was mildly dilated. - Right atrium: The atrium was mildly dilated.    I have personally reviewed and interpreted all radiology studies and my findings are as above.  VENTILATOR SETTINGS:    Cultures none  Antimicrobials: none   Devices none   LINES / TUBES:  none   Continuous Infusions: . sodium chloride    . sodium chloride 20 mL/hr at 01/12/17 0353  . diltiazem (CARDIZEM) infusion Stopped (01/12/17 0227)     Objective: Vitals:  01/12/17 0600 01/12/17 0700 01/12/17 0715 01/12/17 0739  BP: 113/81 129/82 127/80   Pulse:      Resp: 13 (!) 23 18   Temp:    98.1 F (36.7 C)  TempSrc:    Oral  SpO2:      Weight:      Height:        Intake/Output Summary (Last 24 hours) at 01/12/17 0741 Last data filed at 01/12/17 0353  Gross per 24 hour  Intake            728.3  ml  Output             1900 ml  Net          -1171.7 ml   Filed Weights   01/10/17 1750  Weight: 232 lb 5.8 oz (105.4 kg)    Physical Exam:  General: A/O 4,No acute respiratory distress Eyes: negative scleral hemorrhage, negative anisocoria, negative icterus ENT: Negative Runny nose, negative gingival bleeding,poor dentation Neck:  Negative scars, masses, torticollis, lymphadenopathy, JVD Lungs: Clear to auscultation bilaterally without wheezes or crackles Cardiovascular:  slightly tachycardic,Regular rhythm without murmur gallop or rub normal S1 and S2 Abdomen: negative abdominal pain, nondistended, positive soft, bowel sounds, no rebound, no ascites, no appreciable mass Extremities: No significant cyanosis, clubbing, or edema bilateral lower extremities Skin: Negative rashes, lesions, ulcers Psychiatric:  Negative depression, negative anxiety, negative fatigue, negative mania  Central nervous system:  Cranial nerves II through XII intact, tongue/uvula midline, all extremities muscle strength 5/5, sensation intact throughout,  negative dysarthria, negative expressive aphasia, negative receptive aphasia.  .     Data Reviewed: Care during the described time interval was provided by me .  I have reviewed this patient's available data, including medical history, events of note, physical examination, and all test results as part of my evaluation.   CBC:  Recent Labs Lab 01/10/17 0747 01/11/17 0037 01/11/17 1451 01/12/17 0220  WBC 13.1* 9.1 10.7* 8.2  NEUTROABS 5.8  --   --   --   HGB 14.5 13.2 14.1 13.1  HCT 41.3 38.4* 40.5 38.2*  MCV 97.9 98.0 97.1 97.7  PLT 300 259 268 274   Basic Metabolic Panel:  Recent Labs Lab 01/10/17 0747 01/11/17 0037 01/12/17 0220  NA 137 136  --   K 4.0 4.0  --   CL 107 107  --   CO2 20* 21*  --   GLUCOSE 125* 97  --   BUN 14 13  --   CREATININE 0.97 0.82  --   CALCIUM 8.6* 8.4*  --   MG  --   --  1.9   GFR: Estimated Creatinine  Clearance: 126.9 mL/min (by C-G formula based on SCr of 0.82 mg/dL). Liver Function Tests:  Recent Labs Lab 01/10/17 0747  AST 25  ALT 16*  ALKPHOS 71  BILITOT 0.8  PROT 7.8  ALBUMIN 3.2*   No results for input(s): LIPASE, AMYLASE in the last 168 hours. No results for input(s): AMMONIA in the last 168 hours. Coagulation Profile: No results for input(s): INR, PROTIME in the last 168 hours. Cardiac Enzymes:  Recent Labs Lab 01/10/17 1822 01/11/17 0037 01/11/17 0929 01/11/17 1451 01/11/17 2047  TROPONINI <0.03 <0.03 <0.03 <0.03 <0.03   BNP (last 3 results) No results for input(s): PROBNP in the last 8760 hours. HbA1C: No results for input(s): HGBA1C in the last 72 hours. CBG: No results for input(s): GLUCAP in the last 168 hours. Lipid Profile: No results  for input(s): CHOL, HDL, LDLCALC, TRIG, CHOLHDL, LDLDIRECT in the last 72 hours. Thyroid Function Tests:  Recent Labs  01/10/17 1822  TSH 1.506   Anemia Panel: No results for input(s): VITAMINB12, FOLATE, FERRITIN, TIBC, IRON, RETICCTPCT in the last 72 hours. Urine analysis:    Component Value Date/Time   COLORURINE YELLOW 10/31/2014 1840   APPEARANCEUR CLEAR 10/31/2014 1840   LABSPEC 1.007 10/31/2014 1840   PHURINE 7.0 10/31/2014 1840   GLUCOSEU NEGATIVE 10/31/2014 1840   HGBUR NEGATIVE 10/31/2014 1840   BILIRUBINUR Small 10/19/2015 1526   KETONESUR NEGATIVE 10/31/2014 1840   PROTEINUR Negative 10/19/2015 1526   PROTEINUR NEGATIVE 10/31/2014 1840   UROBILINOGEN >=8.0 10/19/2015 1526   UROBILINOGEN 0.2 10/31/2014 1840   NITRITE Negative 10/19/2015 1526   NITRITE NEGATIVE 10/31/2014 1840   LEUKOCYTESUR Negative 10/19/2015 1526   Sepsis Labs: @LABRCNTIP (procalcitonin:4,lacticidven:4)  ) Recent Results (from the past 240 hour(s))  MRSA PCR Screening     Status: None   Collection Time: 01/10/17  5:57 PM  Result Value Ref Range Status   MRSA by PCR NEGATIVE NEGATIVE Final    Comment:        The  GeneXpert MRSA Assay (FDA approved for NASAL specimens only), is one component of a comprehensive MRSA colonization surveillance program. It is not intended to diagnose MRSA infection nor to guide or monitor treatment for MRSA infections.          Radiology Studies: Ct Head Wo Contrast  Result Date: 01/10/2017 CLINICAL DATA:  Pain following fall EXAM: CT HEAD WITHOUT CONTRAST CT CERVICAL SPINE WITHOUT CONTRAST TECHNIQUE: Multidetector CT imaging of the head and cervical spine was performed following the standard protocol without intravenous contrast. Multiplanar CT image reconstructions of the cervical spine were also generated. COMPARISON:  Brain MRI November 03, 2014; head CT and cervical spine CT October 31, 2014 FINDINGS: CT HEAD FINDINGS Brain: Moderate diffuse atrophy is stable. There is no intracranial mass, hemorrhage, extra-axial fluid collection, or midline shift. 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 Elsewhere there is patchy small vessel disease in the centra semiovale bilaterally. No new gray-white compartment lesions evident. No evidence of acute infarct. Air is noted to the right of the sella, chronic and stable from the 2016 study. Vascular: There is no hyperdense vessel. There is calcification in the carotid siphon regions. Skull: Bony calvarium appears intact. Sinuses/Orbits: Old nasal fractures appear stable. There is evidence of old trauma involving the medial left orbital wall, stable. There is mucosal thickening in both maxillary antra as well as in multiple ethmoid air cells. Other visualized paranasal sinuses are clear. No intraorbital lesions are evident. Other: Mastoid air cells are clear. CT CERVICAL SPINE FINDINGS Alignment: There is no appreciable spondylolisthesis. Skull base and vertebrae: Skull base and craniocervical junction regions appear normal. There is no acute fracture. No blastic or lytic bone lesions evident.  Soft tissues and spinal canal: Prevertebral soft tissues and predental space regions are normal. There are no paraspinous lesions. There is no evident cord or canal hematoma. Disc levels: There is moderate disc space narrowing at C4-5. There is somewhat more severe disc space narrowing at C5-6, C6-7, and C7-T1. There are prominent anterior osteophytes at C4, C5, C6, and to a slightly lesser extent at C7. There is facet hypertrophy at multiple levels. There is exit foraminal narrowing due to bony hypertrophy on the left at C3-4, at C4-5 bilaterally, and at C6-7 bilaterally. No frank disc extrusion  or stenosis. Upper chest: Visualized upper lung zones are clear. Other: There are foci of calcification in both subclavian arteries as well as in both carotid arteries. IMPRESSION: CT head: Stable moderate diffuse atrophy, slightly more pronounced involving the cerebellum than elsewhere. 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 No acute infarct. There is patchy periventricular small vessel disease. No mass, hemorrhage, or extra-axial fluid collection. Areas present to the right of the sella, chronic and stable. There appears to be a connection between the posterior sphenoid sinus in this area, possibly residua of old trauma. There are areas of prior nasal trauma and trauma involving the medial left orbital wall. There are foci of paranasal sinus disease currently. There are foci of arterial vascular calcification. CT cervical spine: No fracture or spondylolisthesis. Multilevel osteoarthritic change present. There are foci of calcification in both carotid and subclavian arteries. Electronically Signed   By: Bretta BangWilliam  Woodruff III M.D.   On: 01/10/2017 10:54   Ct Cervical Spine Wo Contrast  Result Date: 01/10/2017 CLINICAL DATA:  Pain following fall EXAM: CT HEAD WITHOUT CONTRAST CT CERVICAL SPINE WITHOUT CONTRAST TECHNIQUE: Multidetector CT imaging of the head and cervical spine was  performed following the standard protocol without intravenous contrast. Multiplanar CT image reconstructions of the cervical spine were also generated. COMPARISON:  Brain MRI November 03, 2014; head CT and cervical spine CT October 31, 2014 FINDINGS: CT HEAD FINDINGS Brain: Moderate diffuse atrophy is stable. There is no intracranial mass, hemorrhage, extra-axial fluid collection, or midline shift. 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 Elsewhere there is patchy small vessel disease in the centra semiovale bilaterally. No new gray-white compartment lesions evident. No evidence of acute infarct. Air is noted to the right of the sella, chronic and stable from the 2016 study. Vascular: There is no hyperdense vessel. There is calcification in the carotid siphon regions. Skull: Bony calvarium appears intact. Sinuses/Orbits: Old nasal fractures appear stable. There is evidence of old trauma involving the medial left orbital wall, stable. There is mucosal thickening in both maxillary antra as well as in multiple ethmoid air cells. Other visualized paranasal sinuses are clear. No intraorbital lesions are evident. Other: Mastoid air cells are clear. CT CERVICAL SPINE FINDINGS Alignment: There is no appreciable spondylolisthesis. Skull base and vertebrae: Skull base and craniocervical junction regions appear normal. There is no acute fracture. No blastic or lytic bone lesions evident. Soft tissues and spinal canal: Prevertebral soft tissues and predental space regions are normal. There are no paraspinous lesions. There is no evident cord or canal hematoma. Disc levels: There is moderate disc space narrowing at C4-5. There is somewhat more severe disc space narrowing at C5-6, C6-7, and C7-T1. There are prominent anterior osteophytes at C4, C5, C6, and to a slightly lesser extent at C7. There is facet hypertrophy at multiple levels. There is exit foraminal narrowing due to bony  hypertrophy on the left at C3-4, at C4-5 bilaterally, and at C6-7 bilaterally. No frank disc extrusion or stenosis. Upper chest: Visualized upper lung zones are clear. Other: There are foci of calcification in both subclavian arteries as well as in both carotid arteries. IMPRESSION: CT head: Stable moderate diffuse atrophy, slightly more pronounced involving the cerebellum than elsewhere. 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 No acute infarct. There is patchy periventricular small vessel disease. No mass, hemorrhage, or extra-axial fluid collection. Areas  present to the right of the sella, chronic and stable. There appears to be a connection between the posterior sphenoid sinus in this area, possibly residua of old trauma. There are areas of prior nasal trauma and trauma involving the medial left orbital wall. There are foci of paranasal sinus disease currently. There are foci of arterial vascular calcification. CT cervical spine: No fracture or spondylolisthesis. Multilevel osteoarthritic change present. There are foci of calcification in both carotid and subclavian arteries. Electronically Signed   By: Bretta Bang III M.D.   On: 01/10/2017 10:54   Dg Chest Portable 1 View  Result Date: 01/10/2017 CLINICAL DATA:  Pain following fall.  Supraventricular tachycardia EXAM: PORTABLE CHEST 1 VIEW COMPARISON:  November 04, 2014 FINDINGS: There is no edema or consolidation. Heart is mildly enlarged with pulmonary vascularity within normal limits. No adenopathy. There is aortic atherosclerosis. No pneumothorax. There is evidence of old rib trauma on the right, unchanged. IMPRESSION: Stable cardiac prominence. Aortic atherosclerosis. No edema or consolidation. Aortic Atherosclerosis (ICD10-I70.0). Electronically Signed   By: Bretta Bang III M.D.   On: 01/10/2017 09:16        Scheduled Meds: . acetaminophen  1,000 mg Oral BID  . apixaban  5 mg Oral Q12H  . aspirin  EC  81 mg Oral Daily  . cholecalciferol  1,000 Units Oral Daily  . levETIRAcetam  750 mg Oral BID  . Melatonin  3 mg Oral QHS  . QUEtiapine  50 mg Oral Daily  . sertraline  100 mg Oral Daily  . sodium chloride flush  3 mL Intravenous Q12H  . zolpidem  10 mg Oral QHS   Continuous Infusions: . sodium chloride    . sodium chloride 20 mL/hr at 01/12/17 0353  . diltiazem (CARDIZEM) infusion Stopped (01/12/17 0227)     LOS: 1 day    Time spent: 40 minutes    Chester Sibert, Roselind Messier, MD [facility] Pager [phone]   If 7PM-7AM, please contact night-coverage [email or web address] Password [email or web address] 01/12/2017, 7:41 AM

## 2017-01-12 NOTE — CV Procedure (Signed)
TEE: Attempted Versed 7 mg Fentanyl 50 mg Unable to pass probe past 25 cm Had Dr Royann Shiversroitoru try as well and also  Unable to pass probe  Will need to cancel ablation procedure for today  [employer]ions Financial CorporationPeter Daouda Lonzo

## 2017-01-12 NOTE — OR Nursing (Signed)
Unable to pass probe after several attempts by Dr. Eden EmmsNishan and Dr. Royann Shiversroitoru

## 2017-01-12 NOTE — Interval H&P Note (Signed)
History and Physical Interval Note:  01/12/2017 8:23 AM  Nena AlexanderFennell Hemsley  has presented today for surgery, with the diagnosis of afib  The various methods of treatment have been discussed with the patient and family. After consideration of risks, benefits and other options for treatment, the patient has consented to  Procedure(s): TRANSESOPHAGEAL ECHOCARDIOGRAM (TEE) (N/A) as a surgical intervention .  The patient's history has been reviewed, patient examined, no change in status, stable for surgery.  I have reviewed the patient's chart and labs.  Questions were answered to the patient's satisfaction.     Charlton HawsPeter Sharaine Delange

## 2017-01-12 NOTE — Progress Notes (Signed)
Progress Note  Patient Name: [name] [name] Date of Encounter: 01/12/2017  Primary Cardiologist: Dr. Royann Shiversroitoru   Subjective   Resting. No complaints.   Inpatient Medications    Scheduled Meds: . acetaminophen  1,000 mg Oral BID  . apixaban  5 mg Oral Q12H  . aspirin EC  81 mg Oral Daily  . cholecalciferol  1,000 Units Oral Daily  . levETIRAcetam  750 mg Oral BID  . Melatonin  3 mg Oral QHS  . QUEtiapine  50 mg Oral Daily  . sertraline  100 mg Oral Daily  . sodium chloride flush  3 mL Intravenous Q12H  . zolpidem  10 mg Oral QHS   Continuous Infusions: . sodium chloride    . diltiazem (CARDIZEM) infusion Stopped (01/12/17 0227)   PRN Meds: sodium chloride, acetaminophen, nitroGLYCERIN, sodium chloride flush   Vital Signs    Vitals:   01/12/17 1010 01/12/17 1015 01/12/17 1018 01/12/17 1100  BP: 113/76  125/75 114/78  Pulse: 90 93 91   Resp: 14 (!) 21 13 16   Temp:    98.2 F (36.8 C)  TempSrc:    Oral  SpO2: 95% 96% 96%   Weight:      Height:        Intake/Output Summary (Last 24 hours) at 01/12/17 1218 Last data filed at 01/12/17 0800  Gross per 24 hour  Intake            368.3 ml  Output             2200 ml  Net          -1831.7 ml   Filed Weights   01/10/17 1750  Weight: 232 lb 5.8 oz (105.4 kg)    Telemetry    Atrial flutter - Personally Reviewed  ECG    Atrial flutter - Personally Reviewed  Physical Exam   GEN: No acute distress.   Neck: No JVD Cardiac: irregular rthythm, no murmurs, rubs, or gallops.  Respiratory: Clear to auscultation bilaterally. GI: Soft, nontender, non-distended  MS: No edema; No deformity. Neuro:  Nonfocal  Psych: Normal affect   Labs    Chemistry Recent Labs Lab 01/10/17 0747 01/11/17 0037  NA 137 136  K 4.0 4.0  CL 107 107  CO2 20* 21*  GLUCOSE 125* 97  BUN 14 13  CREATININE 0.97 0.82  CALCIUM 8.6* 8.4*  PROT 7.8  --   ALBUMIN 3.2*  --   AST 25  --   ALT 16*  --   ALKPHOS 71  --     BILITOT 0.8  --   GFRNONAA >60 >60  GFRAA >60 >60  ANIONGAP 10 8     Hematology Recent Labs Lab 01/11/17 0037 01/11/17 1451 01/12/17 0220  WBC 9.1 10.7* 8.2  RBC 3.92* 4.17* 3.91*  HGB 13.2 14.1 13.1  HCT 38.4* 40.5 38.2*  MCV 98.0 97.1 97.7  MCH 33.7 33.8 33.5  MCHC 34.4 34.8 34.3  RDW 12.4 12.4 12.1  PLT 259 268 274    Cardiac Enzymes Recent Labs Lab 01/11/17 0037 01/11/17 0929 01/11/17 1451 01/11/17 2047  TROPONINI <0.03 <0.03 <0.03 <0.03    Recent Labs Lab 01/10/17 0908  TROPIPOC 0.03     BNPNo results for input(s): BNP, PROBNP in the last 168 hours.   DDimer No results for input(s): DDIMER in the last 168 hours.   Radiology    No results found.  Cardiac Studies   2D Echo 01/12/17 Study Conclusions  -  Left ventricle: The cavity size was normal. Wall thickness was   increased in a pattern of moderate LVH. Systolic function was   normal. The estimated ejection fraction was in the range of 55%   to 60%. Wall motion was normal; there were no regional wall   motion abnormalities. The study is not technically sufficient to   allow evaluation of LV diastolic function. - Mitral valve: There was mild regurgitation. - Right ventricle: The cavity size was mildly dilated. - Right atrium: The atrium was mildly dilated.  Impressions:  - Normal LV function; ;moderate LVH: mildly dilated aortic root   (4.3 cm); mild MR; mild RAE and RVE.  TEE- Failed Attempt   Patient Profile     [name] [name] is a 56 y.o. male with past medical history of Seizure disorder (secondary to prior brain aneurysm per the patient's report), Bipolar disorder, HTN, Hepatitis C, and alcohol abuse who is being seen today for the evaluation of chest pain and atrial flutter with RVR at the request of Dr. Joseph Art.   Felt to be best suited for early ablation of atrial flutter>>not a good candidate for long-term anticoagulation or AADs due to questionable compliance, risk of  fall/injury and use of other medications that prolong QT interval>>Ablation cancelled for 01/12/17 as pt unable to get TEE to r/o LA thrombus (unable to pass probe)  Assessment & Plan    1. Atrial Flutter: as outlined above, best suited for ablation given he is not a good candidate for long term use of anticoagulants and AADs due to compliance and fall risk. Ablation canceled for today. Unable to perform TEE. Multiple attempts made to pass ultrasound probe. Unable to assess for LA thrombus. He will need to be fully anticoagulated with oral agent x 3-4 weeks prior to attempt at ablation. Will start Eliquis, 5 mg BID today and will plan procedure at a later date.   For questions or updates, please contact [facility] Please consult [email or web address] for contact info under Cardiology/STEMI.      Signed, Robbie Lis, PA-C  01/12/2017, 12:18 PM    I have seen and examined the patient along with Robbie Lis, PA-C .  I have reviewed the chart, notes and new data.  I agree with PA/NP's note.  Key new complaints: still too sleepy after sedation for attempted TEE to discuss plans Key examination changes: remains in rate controlled atrial flutter, variable AV block, 70-80s at rest. No evidence of CHF Key new findings / data: unable to pass TEE probe.  PLAN: Will plan for RF ablation for atrial flutter after 4 weeks of anticoagulation. Start anticoagulation. Stop aspirin. When more awake, plan to ambulate and make sure rate control remains adequate.  Thurmon Fair, MD, [facility] [phone] 01/12/2017, 12:48 PM

## 2017-01-12 NOTE — H&P (View-Only) (Signed)
Scheduled for TEE tomorrow 0800h w Dr. Eden EmmsNishan, in anticipation of possible RF ablation for A Flutter. MCr

## 2017-01-13 LAB — CBC
HEMATOCRIT: 37 % — AB (ref 39.0–52.0)
Hemoglobin: 13 g/dL (ref 13.0–17.0)
MCH: 34.1 pg — ABNORMAL HIGH (ref 26.0–34.0)
MCHC: 35.1 g/dL (ref 30.0–36.0)
MCV: 97.1 fL (ref 78.0–100.0)
Platelets: 251 10*3/uL (ref 150–400)
RBC: 3.81 MIL/uL — ABNORMAL LOW (ref 4.22–5.81)
RDW: 12.3 % (ref 11.5–15.5)
WBC: 7.4 10*3/uL (ref 4.0–10.5)

## 2017-01-13 MED ORDER — ACETAMINOPHEN 325 MG PO TABS: 650.0000 mg | ORAL_TABLET | Freq: Four times a day (QID) | ORAL | Status: DC | PRN

## 2017-01-13 MED ORDER — DILTIAZEM HCL ER COATED BEADS 180 MG PO CP24: 180.0000 mg | ORAL_CAPSULE | Freq: Every day | ORAL | 0 refills | Status: DC

## 2017-01-13 MED ORDER — METOPROLOL TARTRATE 5 MG/5ML IV SOLN
INTRAVENOUS | Status: AC
  Filled 2017-01-13: qty 5

## 2017-01-13 MED ORDER — APIXABAN 5 MG PO TABS: 5.0000 mg | ORAL_TABLET | Freq: Two times a day (BID) | ORAL | 0 refills | Status: DC

## 2017-01-13 MED ORDER — METOPROLOL TARTRATE 25 MG PO TABS
25.0000 mg | ORAL_TABLET | Freq: Two times a day (BID) | ORAL | Status: DC
  Administered 2017-01-13 – 2017-01-20 (×15): 25 mg via ORAL
  Filled 2017-01-13 (×16): qty 1

## 2017-01-13 MED ORDER — METOPROLOL TARTRATE 5 MG/5ML IV SOLN
5.0000 mg | Freq: Once | INTRAVENOUS | Status: AC
  Administered 2017-01-13: 5 mg via INTRAVENOUS

## 2017-01-13 NOTE — Progress Notes (Signed)
[facility], pt in bed watching TV, vital signs are stable.

## 2017-01-13 NOTE — Discharge Summary (Signed)
DISCHARGE SUMMARY  [name] [name]  MR#: [id number]  DOB:January 19, 1961  Date of Admission: 01/10/2017 Date of Discharge: 01/13/2017  Attending Physician:Cristoval Teall T  Patient's WUJ:[id number]:[name], [name] [place], [place]  Consults:  [facility]   Disposition: D/C back to his [name]  Follow-up Appts: Follow-up Information    Duke SalviaKlein, [name] C, MD Follow up on 02/16/2017.   Specialty:  Cardiology Why:  1:30PM Contact information: [address] [place] [address] [phone]        Mortimer [name], [name], [place] Follow up in 1 week(s).   Specialty:  Internal Medicine Contact information: [address] [place] [address] [phone]        York SpanielWillis, [name] K, MD Follow up in 1 week(s).   Specialty:  Neurology Contact information: [address] [place] [address] [phone]           Tests Needing Follow-up: -assess for occult bleeding in pt newly taking Eliquis -assure compliance w/ seizure meds -assess for rate control w/ new diagnosis of Aflutter   Discharge Diagnoses: A flutter with RVR Chest pain EtOH abuse - ?Polysusbtance abuse  Seizure disorder - Intractable Seizures  Bipolar D/O Chronic hepatitis C  Initial presentation: 56 y.o.M HxBipolar, EtOH abuse, Dementia, Seizure disorder, Hep C, HTN, Hereditary and idiopathic peripheral neuropathy, and a cerebral aneurysm [facility] who presented following a syncopal episode at his group home.  His heart rate was 200 when EMS arrived. He was given adenosine twice with no results. In ED given cardizem bolus and drip and rate dropped to 110 range and noted to be aflutter.   Hospital Course:  A flutter with RVR Care as per Cardiology - CHA2DS2-VASc is 3 - attempted TEE 10/25 unsuccessful due to inability to pass probe - plan now is to anticoagulate for 4 weeks > elective RF ablation > another month of anticoag - very poor candidate for long-term anticoagulation given  his mental health issues, substance abuse, and frequent falls/seizure activity - rate controlled on oral CCB at time of d/c   Chest pain Appears to have been related to RVR - resolved w/ control of HR   EtOH abuse - ?Polysusbtance abuse  UDS positive opioids (not in patient's home medication) - Ethanol level negative at presentation - patient counseled at length on not using alcohol or other illegal substances when on anticoagulant  Seizure disorder - Intractable Seizures  Cont current seizure medications - f/u w/ his outpt Neuro doctor asap after d/c - appears to have had significant thrombocytopenia on depakote - was to be on Keppra and Vimpat as outpt per records in Epic - Tegretol was stopped in Feb 2017 due to hyponatremia   Bipolar D/O Per pt report - appears stable at this time   Chronic hepatitis C  Allergies as of 01/13/2017      Reactions   Carbamazepine Other (See Comments)   Hyponatremia   Depakote [divalproex Sodium] Other (See Comments)   Unknown - on MAR      Medication List    STOP taking these medications   cephALEXin 500 MG capsule Commonly known as:  KEFLEX     TAKE these medications   acetaminophen 325 MG tablet Commonly known as:  TYLENOL Take 2 tablets (650 mg total) by mouth every 6 (six) hours as needed for mild pain or headache. What changed:  medication strength  how much to take  when to take this  reasons to take this   apixaban 5  MG Tabs tablet Commonly known as:  ELIQUIS Take 1 tablet (5 mg total) by mouth every 12 (twelve) hours.   diclofenac sodium 1 % Gel Commonly known as:  VOLTAREN Apply 4 g topically 4 (four) times daily. To elbow. Please instruct in dosing.   diltiazem 180 MG 24 hr capsule Commonly known as:  CARDIZEM CD Take 1 capsule (180 mg total) by mouth daily.   Lacosamide 100 MG Tabs Commonly known as:  VIMPAT Take 1 tablet (100 mg total) by mouth 2 (two) times daily. What changed:  when to take this     levETIRAcetam 750 MG tablet Commonly known as:  KEPPRA Take 2 tablets (1,500 mg total) by mouth in the morning and in the evening daily.   Melatonin 3 MG Tabs Take 3 mg by mouth at bedtime.   metoprolol tartrate 25 MG tablet Commonly known as:  LOPRESSOR TAKE 1 TABLET BY MOUTH TWICE DAILY What changed:  See the new instructions.   QUEtiapine 50 MG tablet Commonly known as:  SEROQUEL Take 50 mg by mouth daily.   sertraline 100 MG tablet Commonly known as:  ZOLOFT Take 100 mg by mouth daily.   Vitamin D3 1000 units Caps Take 1,000 Units by mouth daily.   zolpidem 10 MG tablet Commonly known as:  AMBIEN Take 10 mg by mouth at bedtime.       Day of Discharge BP 134/88   Pulse 92   Temp 98.3 F (36.8 C) (Oral)   Resp 16   Ht 6\' 6"  (1.981 m)   Wt 105.4 kg (232 lb 5.8 oz)   SpO2 98%   BMI 26.85 kg/m   Physical Exam: General: No acute respiratory distress Lungs: Clear to auscultation bilaterally without wheezes or crackles Cardiovascular: Regular rate and rhythm without murmur  Abdomen: Nontender, nondistended, soft, bowel sounds positive, no rebound, no ascites, no appreciable mass Extremities: No significant cyanosis, clubbing, or edema bilateral lower extremities  Basic Metabolic Panel:  Recent Labs Lab 01/10/17 0747 01/11/17 0037 01/12/17 0220  NA 137 136  --   K 4.0 4.0  --   CL 107 107  --   CO2 20* 21*  --   GLUCOSE 125* 97  --   BUN 14 13  --   CREATININE 0.97 0.82  --   CALCIUM 8.6* 8.4*  --   MG  --   --  1.9    Liver Function Tests:  Recent Labs Lab 01/10/17 0747  AST 25  ALT 16*  ALKPHOS 71  BILITOT 0.8  PROT 7.8  ALBUMIN 3.2*    CBC:  Recent Labs Lab 01/10/17 0747 01/11/17 0037 01/11/17 1451 01/12/17 0220 01/13/17 0235  WBC 13.1* 9.1 10.7* 8.2 7.4  NEUTROABS 5.8  --   --   --   --   HGB 14.5 13.2 14.1 13.1 13.0  HCT 41.3 38.4* 40.5 38.2* 37.0*  MCV 97.9 98.0 97.1 97.7 97.1  PLT 300 259 268 274 251    Cardiac  Enzymes:  Recent Labs Lab 01/10/17 1822 01/11/17 0037 01/11/17 0929 01/11/17 1451 01/11/17 2047  TROPONINI <0.03 <0.03 <0.03 <0.03 <0.03     Recent Results (from the past 240 hour(s))  MRSA PCR Screening     Status: None   Collection Time: 01/10/17  5:57 PM  Result Value Ref Range Status   MRSA by PCR NEGATIVE NEGATIVE Final    Comment:        The GeneXpert MRSA Assay (FDA approved for NASAL  specimens only), is one component of a comprehensive MRSA colonization surveillance program. It is not intended to diagnose MRSA infection nor to guide or monitor treatment for MRSA infections.      Time spent in discharge (includes decision making & examination of pt): 35 minutes  01/13/2017, 12:16 PM   Lonia Blood, MD [facility] Office  [phone] Pager [phone]  On-Call/Text Page:      [email or web address]      password [facility]

## 2017-01-13 NOTE — Evaluation (Signed)
Physical Therapy Evaluation Patient Details Name: [name] [name] MRN: [id number] DOB: Mar 27, 1960 Today's Date: 01/13/2017   History of Present Illness  56 y.o. male admitted after syncopal episode at [name].   He was found to have A-Flutter with RVR.  He was scheduled for an ablation, but MD was unable to pass the TEE ultrasound probe and procedure was cancelled.  He was started on oral anticoagulation.  PMH includes:  h/o brain aneurysm, seizure disorder, h/o ETOH abuse (has abstained > 1 year, per chart), hepatitis C, hereditary and ideopathic peripheral neuropathy.   Past Medical History:  Diagnosis Date  . Aneurysm (HCC)   . ETOHism (HCC)   . Gait abnormality 05/26/2016  . Hepatitis C   . 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 Hypertension   . Seizure [facility])     Clinical Impression  Pt admitted with above diagnosis. Pt currently with functional limitations due to the deficits listed below (see PT Problem List). Pt was able to ambulate with min assist and mod cues for safety with RW.  Very poor safety awareness.  Pt will need SNF and we discussed with pt the need to use a wheelchair for ultimate safety as he is very unsafe once up on feet.  Pt agrees he can use wheelchair.  Pt will benefit from skilled PT to increase their independence and safety with mobility to allow discharge to the venue listed below.      Follow Up Recommendations SNF;Supervision/Assistance - 24 hour    Equipment Recommendations  Wheelchair (measurements PT);Wheelchair cushion (measurements PT)    Recommendations for Other Services       Precautions / Restrictions Precautions Precautions: Fall Precaution Comments: Pt with h/o frequent falls  Restrictions Weight Bearing Restrictions: No      Mobility  Bed Mobility Overal bed mobility: Needs Assistance Bed Mobility: Supine to Sit;Sit to Supine     Supine to sit: Supervision Sit to supine: Supervision      Transfers Overall  transfer level: Needs assistance Equipment used: Rolling walker (2 wheeled) Transfers: Sit to/from [employer] Sit to Stand: Min assist Stand pivot transfers: Min assist;+2 safety/equipment       General transfer comment: Pt is unsteady, requiring min A for balance.  Second person utilized due to pt highly impulsive and at high risk for fall   Ambulation/Gait Ambulation/Gait assistance: Min assist;Mod assist;+2 safety/equipment Ambulation Distance (Feet): 100 Feet Assistive device: Rolling walker (2 wheeled) Gait Pattern/deviations: Step-through pattern;Decreased stride length;Staggering left;Staggering right;Drifts right/left;Trunk flexed;Wide base of support   Gait velocity interpretation: at or above normal speed for age/gender General Gait Details: Pt impulsivity limits his safety with balance especially without RW.  Pt staggering without RW and would fall if therapist hadn't been assisting and cuing him to slow down.  RW slows pt down a little but pt needs constant cues to use the RW safely.  Pt unaware of his poor safety.    Stairs            Wheelchair Mobility    Modified Rankin (Stroke Patients Only)       Balance Overall balance assessment: Needs assistance Sitting-balance support: Feet supported Sitting balance-[name]: Good Sitting balance - Comments: requires supervision    Standing balance support: Bilateral upper extremity supported;No upper extremity supported Standing balance-[name]: Poor Standing balance comment: Pt requires min A to maintain standing balance  Pertinent Vitals/Pain Pain Assessment: Faces Faces Pain Scale: No hurt    Home Living Family/patient expects to be discharged to:: Unsure                 Additional Comments: [name], per pt report     Prior Function Level of Independence: Independent with assistive device(s)          Comments: Pt reports he uses a SPC, and endorses frequent falls      Hand Dominance   Dominant Hand: Right    Extremity/Trunk Assessment   Upper Extremity Assessment Upper Extremity Assessment: Defer to OT evaluation    Lower Extremity Assessment Lower Extremity Assessment: Generalized weakness    Cervical / Trunk Assessment Cervical / Trunk Assessment: Kyphotic  Communication   Communication: No difficulties  Cognition Arousal/Alertness: Awake/alert Behavior During Therapy: WFL for tasks assessed/performed;Impulsive Overall Cognitive Status: No family/caregiver present to determine baseline cognitive functioning                                 General Comments: Pt with good recall of info MD relayed to him, but unable generalize that info into how it migh affect him functionally.  He is extremely impulsive with poor safety awareness.  Pt is very tangential and difficult historian due to this       General Comments General comments (skin integrity, edema, etc.): VSS    Exercises     Assessment/Plan    PT Assessment Patient needs continued PT services  PT Problem List Decreased activity tolerance;Decreased strength;Decreased balance;Decreased mobility;Decreased knowledge of use of DME;Decreased safety awareness;Decreased knowledge of precautions       PT Treatment Interventions DME instruction;Gait training;Functional mobility training;Therapeutic activities;Therapeutic exercise;Balance training;Cognitive remediation;Patient/family education    PT Goals (Current goals can be found in the Care Plan section)  Acute Rehab PT Goals Patient Stated Goal: Did not state  PT Goal Formulation: With patient Time For Goal Achievement: 01/27/17 Potential to Achieve Goals: Good    Frequency Min 2X/week   Barriers to discharge Decreased caregiver support      Co-evaluation PT/OT/SLP Co-Evaluation/Treatment: Yes Reason for Co-Treatment: For  patient/therapist safety PT goals addressed during session: Mobility/safety with mobility OT goals addressed during session: ADL's and self-care       AM-PAC PT "6 Clicks" Daily Activity  Outcome Measure Difficulty turning over in bed (including adjusting bedclothes, sheets and blankets)?: None Difficulty moving from lying on back to sitting on the side of the bed? : None Difficulty sitting down on and standing up from a chair with arms (e.g., wheelchair, bedside commode, etc,.)?: A Lot Help needed moving to and from a bed to chair (including a wheelchair)?: A Lot Help needed walking in hospital room?: A Lot Help needed climbing 3-5 steps with a railing? : Total 6 Click Score: 15    End of Session Equipment Utilized During Treatment: Gait belt Activity Tolerance: Patient limited by fatigue Patient left: in bed;with call bell/phone within reach;with bed alarm set Nurse Communication: Mobility status PT Visit Diagnosis: Unsteadiness on feet (R26.81);Muscle weakness (generalized) (M62.81);History of falling (Z91.81)    Time: [id number] PT Time Calculation (min) (ACUTE ONLY): 29 min   Charges:   PT Evaluation $PT Eval Moderate Complexity: 1 Mod     PT G Codes:        Thang Flett,PT Acute Rehabilitation [phone] (pager)   Berline Lopesawn F Vannie Hilgert 01/13/2017, 4:33 PM

## 2017-01-13 NOTE — Progress Notes (Signed)
Progress Note  Patient Name: [name] [name] Date of Encounter: 01/13/2017  Primary Cardiologist: New  Subjective   No cardiovascular complaints overnight No new episodes of seizure or loss of anxiousness. Rhythm remains atrial flutter with controlled ventricular rate, mostly around 80 bpm.  Inpatient Medications    Scheduled Meds: . metoprolol tartrate      . acetaminophen  1,000 mg Oral BID  . apixaban  5 mg Oral Q12H  . cholecalciferol  1,000 Units Oral Daily  . diltiazem  180 mg Oral Daily  . levETIRAcetam  750 mg Oral BID  . Melatonin  3 mg Oral QHS  . QUEtiapine  50 mg Oral Daily  . sertraline  100 mg Oral Daily  . sodium chloride flush  3 mL Intravenous Q12H  . zolpidem  10 mg Oral QHS   Continuous Infusions: . sodium chloride     PRN Meds: sodium chloride, acetaminophen, nitroGLYCERIN, sodium chloride flush   Vital Signs    Vitals:   01/13/17 0700 01/13/17 0715 01/13/17 0730 01/13/17 0800  BP: 105/65 (!) 118/98 106/61 (!) 118/91  Pulse:    77  Resp: (!) 21 20 18 16   Temp:    98.3 F (36.8 C)  TempSrc:    Oral  SpO2:    98%  Weight:      Height:        Intake/Output Summary (Last 24 hours) at 01/13/17 0942 Last data filed at 01/13/17 0600  Gross per 24 hour  Intake              240 ml  Output             1055 ml  Net             -815 ml   Filed Weights   01/10/17 1750  Weight: 232 lb 5.8 oz (105.4 kg)    Telemetry    Persistent atrial flutter- Personally Reviewed  ECG    Persistent atrial flutter (typical counterclockwise right atrial flutter) - Personally Reviewed  Physical Exam  Calm, comfortable GEN: No acute distress.   Neck: No JVD Cardiac:  irregular, no murmurs, rubs, or gallops.  Respiratory: Clear to auscultation bilaterally. GI: Soft, nontender, non-distended  MS: No edema; No deformity. Neuro:  Nonfocal  Psych: Normal affect   Labs    Chemistry Recent Labs Lab 01/10/17 0747 01/11/17 0037  NA 137 136  K 4.0  4.0  CL 107 107  CO2 20* 21*  GLUCOSE 125* 97  BUN 14 13  CREATININE 0.97 0.82  CALCIUM 8.6* 8.4*  PROT 7.8  --   ALBUMIN 3.2*  --   AST 25  --   ALT 16*  --   ALKPHOS 71  --   BILITOT 0.8  --   GFRNONAA >60 >60  GFRAA >60 >60  ANIONGAP 10 8     Hematology Recent Labs Lab 01/11/17 1451 01/12/17 0220 01/13/17 0235  WBC 10.7* 8.2 7.4  RBC 4.17* 3.91* 3.81*  HGB 14.1 13.1 13.0  HCT 40.5 38.2* 37.0*  MCV 97.1 97.7 97.1  MCH 33.8 33.5 34.1*  MCHC 34.8 34.3 35.1  RDW 12.4 12.1 12.3  PLT 268 274 251    Cardiac Enzymes Recent Labs Lab 01/11/17 0037 01/11/17 0929 01/11/17 1451 01/11/17 2047  TROPONINI <0.03 <0.03 <0.03 <0.03    Recent Labs Lab 01/10/17 0908  TROPIPOC 0.03     BNPNo results for input(s): BNP, PROBNP in the last 168 hours.   DDimer  No results for input(s): DDIMER in the last 168 hours.   Radiology    No results found.  Cardiac Studies   Relevant CV Studies:  Echocardiogram: 09/2014 Study Conclusions  - Left ventricle: The cavity size was normal. There was mild concentric hypertrophy. Systolic function was normal. The estimated ejection fraction was in the range of 55% to 60%. Doppler parameters are consistent with high ventricular filling pressure. 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 - Right atrium: The atrium was mildly dilated. - Atrial septum: No defect or patent foramen ovale was identified  Patient Profile     56 y.o. male with persistent typical counterclockwise right atrial flutter with rapid ventricular response (over 200 bpm) presenting after an episode of loss of consciousness.  He has a history of grand mal seizures after treatment for a brain aneurysm 8 years ago.  Assessment & Plan    1. New-onset Atrial Flutter with RVR - patient experienced an episode of diaphoresis, generalized shaking, and LOC yesterday which he attributed to his known seizure disorder. Upon EMS arrival, his HR was in the  low-200's, with atrial flutter with 1: 1 AV conduction, despite chronic treatment with metoprolol tartrate 25 mg twice daily for hypertension - he remains in atrial flutter with HR well-controlled.  Flutter cycle length is unusually long.  Continue oral diltiazem and metoprolol tartrate. -No evidence of structural heart disease by echocardiography, other than a mildly dilated right atrium.  No clear evidence of atrial septal defect.  History is not strongly suggestive of COPD or of sleep apnea. - This patients CHA2DS2-VASc Score and unadjusted Ischemic Stroke Rate (% per year) is equal to 3.2 % stroke rate/year from a score of 3 (HTN, CVA (2)). Not a good long-term anticoagulation candidate due to his frequent falls.  -Plan was for early radiofrequency ablation, but unfortunately TEE probe could not be passed and cannot be sure he does not have a left atrial thrombus.  Plan is now for 1 month of oral anticoagulation before elective radiofrequency ablation, followed by another month of anticoagulation.  If there is no recurrence of arrhythmia anticoagulants can then be discontinued, limiting the risk of future bleeding.  2. Seizure Disorder -He reports that his seizures are well-controlled on carbamazepine for many years but this medication was discontinued recently because of hyponatremia.  He then reports having frequent seizures, including 3 grand mal seizures in 3 days preceded his admission and would like to be back on carbamazepine.  He does not think his current seizure medicines are working as well as the carbamazepine did. - Currently on Keppra.  Was on combination of Keppra and Vimpat. - Consider updating neurology recommendations regarding anti-seizure medicine.  He was last seen by Butch PennyMegan Millikan, NP [facility] on September [facility]. - Head injury after seizure could have disastrous complications while he is on anticoagulant medication.  For this reason he is not a good  candidate for long-term oral anticoagulants.  3. History of Alcohol Abuse -Abstinent for 1 year. Ethanol level < 10 this admission. - continued cessation advised.     For questions or updates, please contact [facility] Please consult [email or web address] for contact info under Cardiology/STEMI.      Signed, Thurmon FairMihai Juleen Sorrels, MD  01/13/2017, 9:42 AM

## 2017-01-13 NOTE — Clinical Social Work Note (Signed)
PT and OT spoke with RN earlier and stated they were recommending SNF. RN called and spoke with [name] staff. They will not take him back with SNF recommendation and cannot provided level of supervision he needs. CSW met with patient to discuss SNF placement but he refuses. Patient does not appear to understand risks of going back to [name] vs. SNF placement. CSW paged MD to notify and inquired about possible psych consult for capacity.  [name] [name], [place]

## 2017-01-13 NOTE — [place] FL2 (Signed)
[place] MEDICAID FL2 LEVEL OF CARE SCREENING TOOL     IDENTIFICATION  Patient Name: [name] [name] Birthdate: 05-Nov-1960 Sex: male Admission Date (Current Location): 01/10/2017  [place] and [place] Number:  Haynes Bast [id number] R Facility and Address:  [facility], [address] [place], [place] [address]      Provider Number: [id number]  Attending Physician Name and Address:  Lonia Blood, MD  Relative Name and Phone Number:  Jettie Pagan - [phone] (w), [phone] (mobile)     Current Level of Care: Hospital Recommended Level of Care: Assisted Living Facility Prior Approval Number:    Date Approved/Denied:   PASRR Number:    Discharge Plan: Other (Comment) ([name])    Current Diagnoses: Patient Active Problem List   Diagnosis Date Noted  . Atrial flutter with rapid ventricular response (HCC) 01/10/2017  . Syncope 01/10/2017  . Gait abnormality 05/26/2016  . Smoking 01/05/2015  . Chronic pain syndrome 01/05/2015  . Elbow laceration   . Bacteremia   . Fall   . MRSA bacteremia   . Paranoia (HCC)   . Dementia with behavioral disturbance   . Seizure (HCC) 10/31/2014  . Staphylococcus aureus bacteremia without sepsis 10/18/2014  . Altered mental status   . Nonunion fracture of left ulna   . Left elbow pain 10/13/2014  . Ulnar fracture 10/13/2014  . Encephalopathy, hepatic (HCC) 10/09/2014  . Tobacco dependency 10/08/2014  . History of rib fracture 10/08/2014  . 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 Seizures (HCC) 01/24/2014  . Bilateral leg weakness 01/23/2014  . 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/23/2014  . Hyponatremia 11/01/2012  . Convulsions/seizures (HCC) 10/31/2012  . Essential hypertension, benign 10/31/2012  . Septic joint of left elbow (HCC) 10/26/2012  . History of alcohol abuse 10/26/2012  . Chronic liver disease 10/26/2012  . Thrombocytopenia (HCC) 10/26/2012  . Chronic hepatitis C  without hepatic coma (HCC) 10/26/2012    Orientation RESPIRATION BLADDER Height & Weight     Self, Time, Situation, Place  Normal Continent Weight: 232 lb 5.8 oz (105.4 kg) Height:  6\' 6"  (198.1 cm)  BEHAVIORAL SYMPTOMS/MOOD NEUROLOGICAL BOWEL NUTRITION STATUS  Other (Comment) (Cooperative, Calm, Anxious.) Convulsions/Seizures (Dementia with behavioral disturbance) Continent Diet (Heart healthy)  AMBULATORY STATUS COMMUNICATION OF NEEDS Skin   Independent Verbally Other (Comment) (Ulcer: Right big toe (Foam). Laceration: Right head (Gauze).)                       Personal Care Assistance Level of Assistance  Bathing, Feeding, Dressing Bathing Assistance: Independent Feeding assistance: Independent Dressing Assistance: Independent     Functional Limitations Info  Sight, Hearing, Speech Sight Info: Adequate Hearing Info: Adequate Speech Info: Adequate    SPECIAL CARE FACTORS FREQUENCY                       Contractures Contractures Info: Not present    Additional Factors Info  Code Status, Allergies, Psychotropic Code Status Info: Full Allergies Info: Carbamazepine, Depakote (Divalproex Sodium). Psychotropic Info: Seroquel 50 mg PO daily, Zoloft 100 mg PO daily, Ambien 10 mg PO QHS         Current Medications (01/13/2017):  This is the current hospital active medication list Current Facility-Administered Medications  Medication Dose Route Frequency Provider Last Rate Last Dose  . 0.9 %  sodium chloride infusion  250 mL Intravenous PRN Haydee Monica, MD      . acetaminophen (TYLENOL) tablet 1,000  mg  1,000 mg Oral BID Tarry Kos A, MD   1,000 mg at 01/13/17 0810  . acetaminophen (TYLENOL) tablet 650 mg  650 mg Oral Q6H PRN Sheilah Pigeon, PA-C   650 mg at 01/13/17 1610  . apixaban (ELIQUIS) tablet 5 mg  5 mg Oral Q12H Duke Salvia, MD   5 mg at 01/13/17 [phone]  . cholecalciferol (VITAMIN D) tablet 1,000 Units  1,000 Units Oral Daily Haydee Monica,  MD   1,000 Units at 01/13/17 1036  . diltiazem (CARDIZEM CD) 24 hr capsule 180 mg  180 mg Oral Daily Croitoru, Mihai, MD   180 mg at 01/13/17 1036  . levETIRAcetam (KEPPRA) tablet 750 mg  750 mg Oral BID Tarry Kos A, MD   750 mg at 01/13/17 1036  . Melatonin TABS 3 mg  3 mg Oral QHS Tarry Kos A, MD   3 mg at 01/12/17 2116  . metoprolol tartrate (LOPRESSOR) 5 MG/5ML injection           . metoprolol tartrate (LOPRESSOR) tablet 25 mg  25 mg Oral BID Croitoru, Mihai, MD   25 mg at 01/13/17 1036  . nitroGLYCERIN (NITROSTAT) SL tablet 0.4 mg  0.4 mg Sublingual Q5 min PRN Drema Dallas, MD   0.4 mg at 01/11/17 0829  . QUEtiapine (SEROQUEL) tablet 50 mg  50 mg Oral Daily Tarry Kos A, MD   50 mg at 01/13/17 1036  . sertraline (ZOLOFT) tablet 100 mg  100 mg Oral Daily Tarry Kos A, MD   100 mg at 01/13/17 1036  . sodium chloride flush (NS) 0.9 % injection 3 mL  3 mL Intravenous Q12H Tarry Kos A, MD   3 mL at 01/12/17 2117  . sodium chloride flush (NS) 0.9 % injection 3 mL  3 mL Intravenous PRN Haydee Monica, MD      . zolpidem (AMBIEN) tablet 10 mg  10 mg Oral QHS Haydee Monica, MD   10 mg at 01/12/17 2116     Discharge Medications: STOP taking these medications   cephALEXin 500 MG capsule Commonly known as:  KEFLEX     TAKE these medications   acetaminophen 325 MG tablet Commonly known as:  TYLENOL Take 2 tablets (650 mg total) by mouth every 6 (six) hours as needed for mild pain or headache. What changed:  medication strength  how much to take  when to take this  reasons to take this   apixaban 5 MG Tabs tablet Commonly known as:  ELIQUIS Take 1 tablet (5 mg total) by mouth every 12 (twelve) hours.   diclofenac sodium 1 % Gel Commonly known as:  VOLTAREN Apply 4 g topically 4 (four) times daily. To elbow. Please instruct in dosing.   diltiazem 180 MG 24 hr capsule Commonly known as:  CARDIZEM CD Take 1 capsule (180 mg total) by mouth daily.    Lacosamide 100 MG Tabs Commonly known as:  VIMPAT Take 1 tablet (100 mg total) by mouth 2 (two) times daily. What changed:  when to take this   levETIRAcetam 750 MG tablet Commonly known as:  KEPPRA Take 2 tablets (1,500 mg total) by mouth in the morning and in the evening daily.   Melatonin 3 MG Tabs Take 3 mg by mouth at bedtime.   metoprolol tartrate 25 MG tablet Commonly known as:  LOPRESSOR TAKE 1 TABLET BY MOUTH TWICE DAILY What changed:  See the new instructions.   QUEtiapine 50 MG tablet Commonly  known as:  SEROQUEL Take 50 mg by mouth daily.   sertraline 100 MG tablet Commonly known as:  ZOLOFT Take 100 mg by mouth daily.   Vitamin D3 1000 units Caps Take 1,000 Units by mouth daily.   zolpidem 10 MG tablet Commonly known as:  AMBIEN Take 10 mg by mouth at bedtime.     Relevant Imaging Results:  Relevant Lab Results:   Additional Information SS#: [id number]  Margarito LinerSarah C Prisma Decarlo, LCSW

## 2017-01-13 NOTE — Evaluation (Signed)
Occupational Therapy Evaluation Patient Details Name: [name] [name] MRN: [id number] DOB: 1960-11-16 Today's Date: 01/13/2017    History of Present Illness 56 y.o. male admitted after syncopal episode at [name].   He was found to have A-Flutter with RVR.  He was scheduled for an ablation, but MD was unable to pass the TEE ultrasound probe and procedure was cancelled.  He was started on oral anticoagulation.  PMH includes:  h/o brain aneurysm, seizure disorder, h/o ETOH abuse (has abstained > 1 year, per chart), hepatitis C, hereditary and ideopathic peripheral neuropathy.    Clinical Impression   Pt admitted with above. He demonstrates the below listed deficits and will benefit from continued OT to maximize safety and independence with BADLs.  Pt with h/o cognitive deficits - no family present to confirm if pt at baseline.  He is HIGHLY impulsive with very POOR safety awareness.  He also has significant balance deficits.  He does endorse h/o frequent falls.  Currently he requires min A for ADLs, and +2 min A for functional mobility due to pt being highly impulsive and unsafe.  He was living in [name], and was mod I with ADLs, albeit with frequent falls.  Given new anticoagulation on board, feel pt will need a higher level of care/supervision as well as follow up PT/OT  in attempts at preventing further falls.  Recommend SNF level rehab at discharge.       Follow Up Recommendations  SNF;Supervision/Assistance - 24 hour    Equipment Recommendations  None recommended by OT    Recommendations for Other Services       Precautions / Restrictions Precautions Precautions: Fall Precaution Comments: Pt with h/o frequent falls       Mobility Bed Mobility Overal bed mobility: Needs Assistance Bed Mobility: Supine to Sit;Sit to Supine     Supine to sit: Supervision Sit to supine: Supervision      Transfers Overall transfer level: Needs assistance Equipment used: Rolling walker (2  wheeled) Transfers: Sit to/from [employer] Sit to Stand: Min assist Stand pivot transfers: Min assist;+2 safety/equipment       General transfer comment: Pt is unsteady, requiring min A for balance.  Second person utilized due to pt highly impulsive and at high risk for fall     Balance Overall balance assessment: Needs assistance Sitting-balance support: Feet supported Sitting balance-[name]: Good Sitting balance - Comments: requires supervision    Standing balance support: Bilateral upper extremity supported;No upper extremity supported Standing balance-[name]: Poor Standing balance comment: Pt requires min A to maintain standing balance                            ADL either performed or assessed with clinical judgement   ADL Overall ADL's : Needs assistance/impaired Eating/Feeding: Independent   Grooming: Wash/dry face;Oral care;Applying deodorant;Minimal assistance;Standing   Upper Body Bathing: Supervision/ safety;Sitting   Lower Body Bathing: Minimal assistance;Sit to/from stand   Upper Body Dressing : Set up;Supervision/safety;Sitting   Lower Body Dressing: Minimal assistance;Sit to/from stand   Toilet Transfer: Minimal assistance;+2 for safety/equipment;Ambulation;Comfort height toilet;Grab bars;RW   Toileting- Clothing Manipulation and Hygiene: Minimal assistance;Sit to/from stand       Functional mobility during ADLs: Minimal assistance;+2 for safety/equipment;Rolling walker General ADL Comments: Pt is very unsteady, requiring min A for balance.  He is highly impulsive requiring second person for safety as he will jump up and start walking without any warning  Vision         Perception     Praxis      Pertinent Vitals/Pain Pain Assessment: Faces Faces Pain Scale: No hurt     Hand Dominance Right   Extremity/Trunk Assessment Upper Extremity Assessment Upper Extremity Assessment: Overall WFL for tasks  assessed   Lower Extremity Assessment Lower Extremity Assessment: Defer to PT evaluation   Cervical / Trunk Assessment Cervical / Trunk Assessment: Kyphotic   Communication Communication Communication: No difficulties   Cognition Arousal/Alertness: Awake/alert Behavior During Therapy: WFL for tasks assessed/performed;Impulsive Overall Cognitive Status: No family/caregiver present to determine baseline cognitive functioning                                 General Comments: Pt with good recall of info MD relayed to him, but unable generalize that info into how it migh affect him functionally.  He is extremely impulsive with poor safety awareness.  Pt is very tangential and difficult historian due to this    General Comments  VSS     Exercises     Shoulder Instructions      Home Living Family/patient expects to be discharged to:: Unsure                                 Additional Comments: Pt admitted from [name], per pt report       Prior Functioning/Environment Level of Independence: Independent with assistive device(s)        Comments: Pt reports he uses a SPC, and endorses frequent falls         OT Problem List: Decreased strength;Decreased activity tolerance;Impaired balance (sitting and/or standing);Decreased cognition;Decreased safety awareness;Decreased knowledge of use of DME or AE      OT Treatment/Interventions: Self-care/ADL training;DME and/or AE instruction;Therapeutic activities;Cognitive remediation/compensation;Patient/family education;Balance training    OT Goals(Current goals can be found in the care plan section) Acute Rehab OT Goals Patient Stated Goal: Did not state  OT Goal Formulation: With patient Time For Goal Achievement: 01/27/17 Potential to Achieve Goals: Good ADL Goals Pt Will Perform Grooming: with min guard assist;standing Pt Will Perform Lower Body Bathing: with min guard assist;sit to/from  stand Pt Will Perform Lower Body Dressing: with min guard assist;sit to/from stand Pt Will Transfer to Toilet: with min guard assist;ambulating;regular height toilet;bedside commode;grab bars Pt Will Perform Toileting - Clothing Manipulation and hygiene: with min guard assist;sit to/from stand Additional ADL Goal #1: Pt will adhere to safety precautions and will require no more than min cues to not attempt to sit to stand or transfer without staff assist.   OT Frequency: Min 2X/week   Barriers to D/C: Decreased caregiver support          Co-evaluation PT/OT/SLP Co-Evaluation/Treatment: Yes Reason for Co-Treatment: For patient/therapist safety;Necessary to address cognition/behavior during functional activity;To address functional/ADL transfers   OT goals addressed during session: ADL's and self-care      AM-PAC PT "6 Clicks" Daily Activity     Outcome Measure Help from another person eating meals?: None Help from another person taking care of personal grooming?: A Little Help from another person toileting, which includes using toliet, bedpan, or urinal?: A Little Help from another person bathing (including washing, rinsing, drying)?: A Little Help from another person to put on and taking off regular upper body clothing?: A Little Help from another person  to put on and taking off regular lower body clothing?: A Little 6 Click Score: 19   End of Session Equipment Utilized During Treatment: Rolling walker;Gait belt Nurse Communication: Mobility status  Activity Tolerance: Patient tolerated treatment well Patient left: in bed;with call bell/phone within reach;with bed alarm set;with nursing/sitter in room  OT Visit Diagnosis: Unsteadiness on feet (R26.81);Cognitive communication deficit (R41.841)                Time: 9604-5409 OT Time Calculation (min): 30 min Charges:  OT General Charges $OT Visit: 1 Visit OT Evaluation $OT Eval Moderate Complexity: 1 Mod G-Codes:     [employer], OTR/L [phone]   Jeani Hawking M 01/13/2017, 2:47 PM

## 2017-01-13 NOTE — Progress Notes (Signed)
Anticipated DC today back to facility. CM and SW on case. Pt educated on new medication regimen including increase risk of bleeding. PT/OT eval prior to DC today. Pt educated to take all medications as prescribed and be very cautious when standing and ambulating as to not fall.   All instructions given to patient and educated patient; Rx sent to pharmacy. PIV DC, hemostasis achieved. All belongings sent home with patient. Awaiting transportation via CSW.

## 2017-01-13 NOTE — Care Management Note (Signed)
Case Management Note  Patient Details  Name: [name] [name] MRN: [id number] Date of Birth: 05/26/1960  Subjective/Objective:  Pt admitted with a flutter with RVR                   Action/Plan:  PTA from [name]- CSW actively working on discharge back to facility.  No CM needs, recommendations or orders written prior to this note being written - pt has discharge orders.     Expected Discharge Date:  01/13/17               Expected Discharge Plan:  Assisted Living / Rest Home  In-House Referral:  Clinical Social Work  Discharge planning Services  CM Consult  Post Acute Care Choice:    Choice offered to:     DME Arranged:    DME Agency:     HH Arranged:    HH Agency:     Status of Service:     If discussed at [employer] Meetings, dates discussed:    Additional Comments:  Cherylann ParrClaxton, [name] Steines S, RN 01/13/2017, 1:39 PM

## 2017-01-14 DIAGNOSIS — G4700 Insomnia, unspecified: Secondary | ICD-10-CM

## 2017-01-14 DIAGNOSIS — R55 Syncope and collapse: Secondary | ICD-10-CM

## 2017-01-14 DIAGNOSIS — F0281 Dementia in other diseases classified elsewhere with behavioral disturbance: Secondary | ICD-10-CM

## 2017-01-14 DIAGNOSIS — Z87891 Personal history of nicotine dependence: Secondary | ICD-10-CM

## 2017-01-14 LAB — CBC
HEMATOCRIT: 38.4 % — AB (ref 39.0–52.0)
HEMOGLOBIN: 13.2 g/dL (ref 13.0–17.0)
MCH: 33.3 pg (ref 26.0–34.0)
MCHC: 34.4 g/dL (ref 30.0–36.0)
MCV: 97 fL (ref 78.0–100.0)
Platelets: 236 10*3/uL (ref 150–400)
RBC: 3.96 MIL/uL — AB (ref 4.22–5.81)
RDW: 12 % (ref 11.5–15.5)
WBC: 7.5 10*3/uL (ref 4.0–10.5)

## 2017-01-14 MED ORDER — LACOSAMIDE 100 MG PO TABS: 100.0000 mg | ORAL_TABLET | Freq: Two times a day (BID) | ORAL | Status: DC

## 2017-01-14 MED ORDER — ACETAMINOPHEN 325 MG PO TABS
650.0000 mg | ORAL_TABLET | Freq: Two times a day (BID) | ORAL | Status: DC
  Administered 2017-01-14 – 2017-01-19 (×10): 650 mg via ORAL
  Administered 2017-01-19: 325 mg via ORAL
  Administered 2017-01-20: 650 mg via ORAL
  Filled 2017-01-14 (×13): qty 2

## 2017-01-14 MED ORDER — LEVETIRACETAM 750 MG PO TABS
1500.0000 mg | ORAL_TABLET | Freq: Two times a day (BID) | ORAL | Status: DC
  Administered 2017-01-14 – 2017-01-20 (×12): 1500 mg via ORAL
  Filled 2017-01-14 (×2): qty 2
  Filled 2017-01-14 (×7): qty 3
  Filled 2017-01-14: qty 2
  Filled 2017-01-14: qty 3
  Filled 2017-01-14: qty 2
  Filled 2017-01-14: qty 3

## 2017-01-14 MED ORDER — LACOSAMIDE 50 MG PO TABS
100.0000 mg | ORAL_TABLET | Freq: Two times a day (BID) | ORAL | Status: DC
  Administered 2017-01-14 – 2017-01-20 (×13): 100 mg via ORAL
  Filled 2017-01-14 (×13): qty 2

## 2017-01-14 NOTE — Consult Note (Signed)
[facility] Face-to-Face Psychiatry Consult   Reason for Consult:  Competency evaluation  Referring Physician:  Unit MD Patient Identification: [name] [name] MRN:  [id number] Principal Diagnosis: Atrial flutter with rapid ventricular response [facility]) Diagnosis:   Patient Active Problem List   Diagnosis Date Noted  . Atrial flutter with rapid ventricular response (HCC) [I48.92] 01/10/2017  . Syncope [R55] 01/10/2017  . Gait abnormality [R26.9] 05/26/2016  . Smoking [F17.200] 01/05/2015  . Chronic pain syndrome [G89.4] 01/05/2015  . Elbow laceration [S51.019A]   . Bacteremia [R78.81]   . Fall [W19.XXXA]   . MRSA bacteremia [R78.81]   . Paranoia (HCC) [F22]   . Dementia with behavioral disturbance [F03.91]   . Seizure (HCC) [R56.9] 10/31/2014  . Staphylococcus aureus bacteremia without sepsis [R78.81] 10/18/2014  . Altered mental status [R41.82]   . 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 Left elbow pain [M25.522] 10/13/2014  . Ulnar fracture [S52.209A] 10/13/2014  . Encephalopathy, hepatic (HCC) [K72.90] 10/09/2014  . Tobacco dependency [F17.200] 10/08/2014  . History of rib fracture [Z87.81] 10/08/2014  . Hereditary and idiopathic peripheral neuropathy [G60.9] 06/03/2014  . Seizures (HCC) [R56.9] 01/24/2014  . Bilateral leg weakness [R29.898] 01/23/2014  . S/P cerebral aneurysm repair [W09.811, Z86.79] 01/23/2014  . Hyponatremia [E87.1] 11/01/2012  . Convulsions/seizures (HCC) [R56.9] 10/31/2012  . Essential hypertension, benign [I10] 10/31/2012  . Septic joint of left elbow (HCC) [M00.9] 10/26/2012  . History of alcohol abuse [Z87.898] 10/26/2012  . Chronic liver disease [K76.9] 10/26/2012  . Thrombocytopenia (HCC) [D69.6] 10/26/2012  . Chronic hepatitis C without hepatic coma (HCC) [B18.2] 10/26/2012    Total Time spent with patient: 45 minutes  Subjective:   [name] [name] is a 56 y.o. male patient admitted with  HxBipolar, EtOH abuse, Dementia, Seizure disorder,  Hep C, HTN, Hereditary and idiopathic peripheral neuropathy, and a cerebral aneurysm S/P surgery at [facility] who had presented following a syncopal episode at his [name]/group home.   HPI:  Consult requested for competency evaluation . States he has been diagnosed with bipolar but remains rambling and having disorganized thoughts. He feels he has 3 imaginary beings around him. Says they dont bother him but they are there States he was living home but he chose [name] so they can help him if he has seizures considering his condition. He remains off topic states mind is racing and he gets forgetful.   Insight remains low and has been diagnosed with dementia, prior etoh abuse abuse and sp cerbral aneurysm  Denies hopelessness or suicidal thoughts Has poor recall and concentration.    Past Psychiatric History: Bipolar   Risk to Self: Is patient at risk for suicide?: No Risk to Others:   Prior Inpatient Therapy:   Prior Outpatient Therapy:    Past Medical History:  Past Medical History:  Diagnosis Date  . Aneurysm (HCC)   . ETOHism (HCC)   . Gait abnormality 05/26/2016  . Hepatitis C   . 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 Hypertension   . Seizure [facility])     Past Surgical History:  Procedure Laterality Date  . CEREBRAL ANEURYSM REPAIR     At [facility]  . HARDWARE REMOVAL Left 10/29/2012   Procedure: HARDWARE REMOVAL;  Surgeon: Sheral Apley, MD;  Location: [facility] OR;  Service: Orthopedics;  Laterality: Left;  . I&D EXTREMITY Left 10/26/2012   Procedure: IRRIGATION AND DEBRIDEMENT Left Elbow;  Surgeon: Sheral Apley, MD;  Location: [facility] OR;  Service: Orthopedics;  Laterality: Left;  .  I&D EXTREMITY Left 10/29/2012   Procedure: IRRIGATION AND DEBRIDEMENT EXTREMITY, wound vac change, stimulan beads;  Surgeon: Sheral Apleyimothy D Murphy, MD;  Location: [facility] OR;  Service: Orthopedics;  Laterality: Left;  lateral on bean bag  . I&D EXTREMITY Left 11/02/2012   Procedure: IRRIGATION  AND DEBRIDEMENT EXTREMITY;  Surgeon: Sheral Apleyimothy D Murphy, MD;  Location: [facility] OR;  Service: Orthopedics;  Laterality: Left;  . INCISION AND DRAINAGE ABSCESS Left 11/02/2012   Procedure: INCISION AND DRAINAGE ABSCESS;  Surgeon: Sheral Apleyimothy D Murphy, MD;  Location: [facility] OR;  Service: Orthopedics;  Laterality: Left;   Family History:  Family History  Problem Relation Age of Onset  . Hypertension Mother   . Cancer Mother   . Hypertension Father   . Diabetes Sister   . Cancer Sister   . Cancer Sister    Family Psychiatric  History: see chart Social History:  History  Alcohol Use No    Comment: quit drinking 11/2011 per patient     History  Drug Use No    Social History   Social History  . Marital status: Single    Spouse name: N/A  . Number of children: 3  . Years of education: 6011 th   Occupational History  . disabled    Social History Main Topics  . Smoking status: Former Smoker    Packs/day: 0.25    Types: Cigarettes  . Smokeless tobacco: Never Used  . Alcohol use No     Comment: quit drinking 11/2011 per patient  . Drug use: No  . Sexual activity: Not Currently   Other Topics Concern  . None   Social History Narrative   Patient is single and lives at home alone.   Disabled.   Education 11 th grade.   Right handed.   Caffeine none .   Additional Social History:    Allergies:   Allergies  Allergen Reactions  . Carbamazepine Other (See Comments)    Hyponatremia  . Depakote [Divalproex Sodium] Other (See Comments)    Unknown - on MAR    Labs:  Results for orders placed or performed during the hospital encounter of 01/10/17 (from the past 48 hour(s))  CBC     Status: Abnormal   Collection Time: 01/13/17  2:35 AM  Result Value Ref Range   WBC 7.4 4.0 - 10.5 K/uL   RBC 3.81 (L) 4.22 - 5.81 MIL/uL   Hemoglobin 13.0 13.0 - 17.0 g/dL   HCT 16.137.0 (L) 09.639.0 - 04.552.0 %   MCV 97.1 78.0 - 100.0 fL   MCH 34.1 (H) 26.0 - 34.0 pg   MCHC 35.1 30.0 - 36.0 g/dL   RDW 40.912.3 81.111.5 -  91.415.5 %   Platelets 251 150 - 400 K/uL  CBC     Status: Abnormal   Collection Time: 01/14/17  3:03 AM  Result Value Ref Range   WBC 7.5 4.0 - 10.5 K/uL   RBC 3.96 (L) 4.22 - 5.81 MIL/uL   Hemoglobin 13.2 13.0 - 17.0 g/dL   HCT 78.238.4 (L) 95.639.0 - 21.352.0 %   MCV 97.0 78.0 - 100.0 fL   MCH 33.3 26.0 - 34.0 pg   MCHC 34.4 30.0 - 36.0 g/dL   RDW 08.612.0 57.811.5 - 46.915.5 %   Platelets 236 150 - 400 K/uL    Current Facility-Administered Medications  Medication Dose Route Frequency Provider Last Rate Last Dose  . acetaminophen (TYLENOL) tablet 650 mg  650 mg Oral Q6H PRN Sheilah PigeonUrsuy, Renee Lynn, PA-C  650 mg at 01/14/17 0521  . acetaminophen (TYLENOL) tablet 650 mg  650 mg Oral BID Lonia Blood, MD      . apixaban Everlene Balls) tablet 5 mg  5 mg Oral Q12H Duke Salvia, MD   5 mg at 01/14/17 0932  . cholecalciferol (VITAMIN D) tablet 1,000 Units  1,000 Units Oral Daily Haydee Monica, MD   1,000 Units at 01/14/17 0932  . diltiazem (CARDIZEM CD) 24 hr capsule 180 mg  180 mg Oral Daily Croitoru, Mihai, MD   180 mg at 01/14/17 0932  . lacosamide (VIMPAT) tablet 100 mg  100 mg Oral BID Jetty Duhamel T, MD      . levETIRAcetam (KEPPRA) tablet 1,500 mg  1,500 mg Oral BID Lonia Blood, MD      . Melatonin TABS 3 mg  3 mg Oral QHS Haydee Monica, MD   3 mg at 01/13/17 2104  . metoprolol tartrate (LOPRESSOR) tablet 25 mg  25 mg Oral BID Croitoru, Mihai, MD   25 mg at 01/14/17 0933  . nitroGLYCERIN (NITROSTAT) SL tablet 0.4 mg  0.4 mg Sublingual Q5 min PRN Drema Dallas, MD   0.4 mg at 01/11/17 0829  . QUEtiapine (SEROQUEL) tablet 50 mg  50 mg Oral Daily Tarry Kos A, MD   50 mg at 01/14/17 0933  . sertraline (ZOLOFT) tablet 100 mg  100 mg Oral Daily Tarry Kos A, MD   100 mg at 01/14/17 0933  . zolpidem (AMBIEN) tablet 10 mg  10 mg Oral QHS Haydee Monica, MD   10 mg at 01/13/17 2104    Musculoskeletal:   Psychiatric Specialty Exam: Physical Exam  ROS  Blood pressure 120/68, pulse 78,  temperature 98.5 F (36.9 C), temperature source Oral, resp. rate (!) 21, height 6\' 6"  (1.981 m), weight 105.4 kg (232 lb 5.8 oz), SpO2 96 %.Body mass index is 26.85 kg/m.  General Appearance: Casual  Eye Contact:  Fair  Speech:  Slow  Volume:  Normal  Mood:  States to be worriful  Affect:  Congruent  Thought Process:  Disorganized, Irrelevant and Descriptions of Associations: Circumstantial  Orientation:  Full (Time, Place, and Person)  Thought Content:  Hallucinations: Visual and Rumination  Suicidal Thoughts:  No  Homicidal Thoughts:  No  Memory:  Immediate;   Fair Recent;   Poor  Judgement:  Poor  Insight:  Shallow  Psychomotor Activity:  Decreased  Concentration:  poor  Recall:  [employer] of Knowledge:  Fair  Language:  Fair  Akathisia:  No    AIMS (if indicated):     Assets:  Financial Resources/Insurance  ADL's:  Somewhat limited  Cognition:  Impaired,  Mild  Sleep:        Treatment Plan Summary: Daily contact with patient to assess and evaluate symptoms and progress in treatment, Medication management and Plan as follows With the history available, collateral info from charts and staff and observations in cognition Patient does not have competency to determine best course for discharge or disposition. He would benefit from a higher level of care then [name].  Considering his rambling, which may be part of underlying dementia or bipolar, would be ok to increase dose and change seroquel  night to 100mg  if does not effect his medical condition.   Disposition: No evidence of imminent risk to self or others at present.   Supportive therapy provided about ongoing stressors.  Thresa Ross, MD 01/14/2017 12:27 PM

## 2017-01-14 NOTE — Progress Notes (Signed)
Received call from Ms Hurdle RN from pt's assisted living home. She called to ask if pt was going to go to SNF or come back to assisted living. I stated I was waiting for MD to make rounds and that I would call her back when I knew something. She stated pt is not capable of making his decisions and that he falls frequently at the assisted living facility. I passed this information on to Dr Sharon SellerMcClung

## 2017-01-14 NOTE — Progress Notes (Signed)
[facility] TEAM 1 - Stepdown/ICU TEAM  [name] [name]  ZOX:[id number] DOB: Mar 07, 1961 DOA: 01/10/2017 PCP: Mortimer Fries, PA    Brief Narrative:  56 y.o.M HxBipolar, EtOH abuse, Dementia, Seizure disorder, Hep C, HTN, Hereditary and idiopathic peripheral neuropathy, and a cerebral aneurysm S/P surgery at [facility] who presented following a syncopal episode at his [name]/group home. His heart rate was 200 when EMSarrived. He was given adenosine twice with no results. In ED given cardizem bolus and drip and rate dropped to 110 range and noted to be aflutter.   Subjective: The patient complains of a left temporal headache which he states is a common chronic recurring problem for him.  He denies focal neurologic deficits.  He is alert and oriented and conversant.  He is somewhat agitated.  He is adamant that he wishes to return to his prior assisted living facility.  He denies chest pain nausea vomiting or abdominal pain.  Assessment & Plan:  A flutter with RVR Care as per Cardiology - CHA2DS2-VASc is 3 - attempted TEE 10/25 unsuccessful due to inability to pass probe - plan now is to anticoagulatefor 4 weeks >elective RF ablation >another month of anticoag - very poor candidate for long-term anticoagulation given his mental health issues, substance abuse, and frequent falls/seizure activity - rate controlled on oral CCB   Chest pain Appears to have been related to RVR - resolved w/ control of HR   EtOH abuse - ?Polysusbtance abuse  UDS positive opioids (not in patient's home medication) - Ethanol level negative at presentation - patient counseled at length on not using alcohol or other illegal substances when on anticoagulant  Seizure disorder - Intractable Seizures  Cont current seizure medications - f/u w/ his outpt Neuro doctor asap after d/c - appears to have had significant thrombocytopenia on depakote - was to be on Keppra and Vimpat as outpt per records in Epic - Tegretol was  stopped in Feb 2017 due to hyponatremia   Bipolar D/O Per pt report - appears stable at this time   Chronic hepatitis C  Disposition  Initial plan was for the patient to return to his assisted living facility - PT/OT evaluated the patient however and felt that he would be most appropriate at a higher level of care/SNF - the patient is refusing SNF and wishes to return to his [name] however the [name] will not accept the patient given that it is clear he now requires a higher level of care - the patient's competency to make an appropriate decision has been called into question - psychiatry has been consulted today to evaluate the patient's competency  DVT prophylaxis: eliquis Code Status: FULL CODE Family Communication: no family present at time of exam  Disposition Plan:   Consultants:  Cardiology  Psychiatry   Antimicrobials:  none  Objective: Blood pressure (!) 146/94, pulse 71, temperature 97.6 F (36.4 C), temperature source Axillary, resp. rate (!) 21, height 6\' 6"  (1.981 m), weight 105.4 kg (232 lb 5.8 oz), SpO2 90 %.  Intake/Output Summary (Last 24 hours) at 01/14/17 1044 Last data filed at 01/14/17 0900  Gross per 24 hour  Intake              720 ml  Output             1900 ml  Net            -1180 ml   Filed Weights   01/10/17 1750  Weight: 105.4 kg (232 lb 5.8  oz)    Examination: General: No acute respiratory distress Lungs: Clear to auscultation bilaterally without wheezes or crackles Cardiovascular: Regular rate and rhythm without murmur  Abdomen: Nontender, nondistended, soft, bowel sounds positive, no rebound, no ascites, no appreciable mass Extremities: No significant edema bilateral lower extremities  CBC:  Recent Labs Lab 01/10/17 0747 01/11/17 0037 01/11/17 1451 01/12/17 0220 01/13/17 0235 01/14/17 0303  WBC 13.1* 9.1 10.7* 8.2 7.4 7.5  NEUTROABS 5.8  --   --   --   --   --   HGB 14.5 13.2 14.1 13.1 13.0 13.2  HCT 41.3 38.4* 40.5 38.2* 37.0*  38.4*  MCV 97.9 98.0 97.1 97.7 97.1 97.0  PLT 300 259 268 274 251 236   Basic Metabolic Panel:  Recent Labs Lab 01/10/17 0747 01/11/17 0037 01/12/17 0220  NA 137 136  --   K 4.0 4.0  --   CL 107 107  --   CO2 20* 21*  --   GLUCOSE 125* 97  --   BUN 14 13  --   CREATININE 0.97 0.82  --   CALCIUM 8.6* 8.4*  --   MG  --   --  1.9   GFR: Estimated Creatinine Clearance: 126.9 mL/min (by C-G formula based on SCr of 0.82 mg/dL).  Liver Function Tests:  Recent Labs Lab 01/10/17 0747  AST 25  ALT 16*  ALKPHOS 71  BILITOT 0.8  PROT 7.8  ALBUMIN 3.2*   Cardiac Enzymes:  Recent Labs Lab 01/10/17 1822 01/11/17 0037 01/11/17 0929 01/11/17 1451 01/11/17 2047  TROPONINI <0.03 <0.03 <0.03 <0.03 <0.03    HbA1C: Hgb A1c MFr Bld  Date/Time Value Ref Range Status  01/24/2014 05:00 AM 5.7 (H) <5.7 % Final    Comment:    (NOTE)                                                                       According to the ADA Clinical Practice Recommendations for 2011, when HbA1c is used as a screening test:  >=6.5%   Diagnostic of Diabetes Mellitus           (if abnormal result is confirmed) 5.7-6.4%   Increased risk of developing Diabetes Mellitus References:Diagnosis and Classification of Diabetes Mellitus,Diabetes Care,2011,34(Suppl 1):S62-S69 and Standards of Medical Care in         Diabetes - 2011,Diabetes Care,2011,34 (Suppl 1):S11-S61.     CBG: No results for input(s): GLUCAP in the last 168 hours.  Recent Results (from the past 240 hour(s))  MRSA PCR Screening     Status: None   Collection Time: 01/10/17  5:57 PM  Result Value Ref Range Status   MRSA by PCR NEGATIVE NEGATIVE Final    Comment:        The GeneXpert MRSA Assay (FDA approved for NASAL specimens only), is one component of a comprehensive MRSA colonization surveillance program. It is not intended to diagnose MRSA infection nor to guide or monitor treatment for MRSA infections.      Scheduled  Meds: . acetaminophen  1,000 mg Oral BID  . apixaban  5 mg Oral Q12H  . cholecalciferol  1,000 Units Oral Daily  . diltiazem  180 mg Oral Daily  . levETIRAcetam  750  mg Oral BID  . Melatonin  3 mg Oral QHS  . metoprolol tartrate  25 mg Oral BID  . QUEtiapine  50 mg Oral Daily  . sertraline  100 mg Oral Daily  . sodium chloride flush  3 mL Intravenous Q12H  . zolpidem  10 mg Oral QHS     LOS: 3 days   Lonia Blood, MD [facility] Office  [phone] Pager - Text Page per Amion as per below:  On-Call/Text Page:      [email or web address]      password [email or web address]  If 7PM-7AM, please contact night-coverage [email or web address] Password [email or web address] 01/14/2017, 10:44 AM

## 2017-01-14 NOTE — NC FL2 (Signed)
[place] MEDICAID FL2 LEVEL OF CARE SCREENING TOOL     IDENTIFICATION  Patient Name: [name] [name] Birthdate: [birth year] Sex: male Admission Date (Current Location): 01/10/2017  [place]  [place] [id number] R Facility and Address:  [facility], [address], [place], [place] [address]      Provider Number: [id number]  Attending Physician Name and Address:  Lonia BloodMcClung, Jeffrey T, MD  Relative Name and Phone Number:  Jettie PaganKim Snipes - [phone]- [phone] (w), [phone] (mobile)     Current Level of Care: Hospital Recommended Level of Care: Skilled Nursing Facility Prior Approval Number:    Date Approved/Denied:   PASRR Number: Pending  Discharge Plan: SNF    Current Diagnoses: Patient Active Problem List   Diagnosis Date Noted  . Atrial flutter with rapid ventricular response (HCC) 01/10/2017  . Syncope 01/10/2017  . Gait abnormality 05/26/2016  . Smoking 01/05/2015  . Chronic pain syndrome 01/05/2015  . Elbow laceration   . Bacteremia   . Fall   . MRSA bacteremia   . Paranoia (HCC)   . Dementia with behavioral disturbance   . Seizure (HCC) 10/31/2014  . Staphylococcus aureus bacteremia without sepsis 10/18/2014  . Altered mental status   . Nonunion fracture of left ulna   . Left elbow pain 10/13/2014  . Ulnar fracture 10/13/2014  . Encephalopathy, hepatic (HCC) 10/09/2014  . Tobacco dependency 10/08/2014  . History of rib fracture 10/08/2014  . 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 Seizures (HCC) 01/24/2014  . Bilateral leg weakness 01/23/2014  . 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/23/2014  . Hyponatremia 11/01/2012  . Convulsions/seizures (HCC) 10/31/2012  . Essential hypertension, benign 10/31/2012  . Septic joint of left elbow (HCC) 10/26/2012  . History of alcohol abuse 10/26/2012  . Chronic liver disease 10/26/2012  . Thrombocytopenia (HCC) 10/26/2012  . Chronic hepatitis C without hepatic  coma (HCC) 10/26/2012    Orientation RESPIRATION BLADDER Height & Weight     Self, Time, Situation, Place  Normal Continent Weight: 232 lb 5.8 oz (105.4 kg) Height:  6\' 6"  (198.1 cm)  BEHAVIORAL SYMPTOMS/MOOD NEUROLOGICAL BOWEL NUTRITION STATUS    Convulsions/Seizures (Dementia with behavioral disturbance) Continent Diet (Heart healthy)  AMBULATORY STATUS COMMUNICATION OF NEEDS Skin   Limited Assist Verbally Other (Comment) (Ulcer: Right big toe (Foam). Laceration: Right head (Gauze).)                       Personal Care Assistance Level of Assistance  Bathing, Feeding, Dressing Bathing Assistance: Limited assistance Feeding assistance: Independent Dressing Assistance: Limited assistance     Functional Limitations Info  Sight, Hearing, Speech Sight Info: Adequate Hearing Info: Adequate Speech Info: Adequate    SPECIAL CARE FACTORS FREQUENCY  PT (By licensed PT), OT (By licensed OT)     PT Frequency: 5x/wk OT Frequency: 5x/wk            Contractures Contractures Info: Not present    Additional Factors Info  Code Status, Allergies, Psychotropic Code Status Info: Full Allergies Info: Carbamazepine, Depakote (Divalproex Sodium). Psychotropic Info: Seroquel 50 mg PO daily, Zoloft 100 mg PO daily, Ambien 10 mg PO QHS         Current Medications (01/14/2017):  This is the current hospital active medication list Current Facility-Administered Medications  Medication Dose Route Frequency Provider Last Rate Last Dose  . acetaminophen (TYLENOL) tablet 650 mg  650 mg Oral Q6H PRN Sheilah PigeonUrsuy, Renee Lynn, PA-C   650 mg at  01/14/17 0521  . acetaminophen (TYLENOL) tablet 650 mg  650 mg Oral BID Lonia Blood, MD      . apixaban Everlene Balls) tablet 5 mg  5 mg Oral Q12H Duke Salvia, MD   5 mg at 01/14/17 0932  . cholecalciferol (VITAMIN D) tablet 1,000 Units  1,000 Units Oral Daily Haydee Monica, MD   1,000 Units at 01/14/17 0932  . diltiazem (CARDIZEM CD) 24 hr capsule  180 mg  180 mg Oral Daily Croitoru, Mihai, MD   180 mg at 01/14/17 0932  . lacosamide (VIMPAT) tablet 100 mg  100 mg Oral BID Lonia Blood, MD   100 mg at 01/14/17 1300  . levETIRAcetam (KEPPRA) tablet 1,500 mg  1,500 mg Oral BID Lonia Blood, MD      . Melatonin TABS 3 mg  3 mg Oral QHS Haydee Monica, MD   3 mg at 01/13/17 2104  . metoprolol tartrate (LOPRESSOR) tablet 25 mg  25 mg Oral BID Croitoru, Mihai, MD   25 mg at 01/14/17 0933  . nitroGLYCERIN (NITROSTAT) SL tablet 0.4 mg  0.4 mg Sublingual Q5 min PRN Drema Dallas, MD   0.4 mg at 01/11/17 0829  . QUEtiapine (SEROQUEL) tablet 50 mg  50 mg Oral Daily Tarry Kos A, MD   50 mg at 01/14/17 0933  . sertraline (ZOLOFT) tablet 100 mg  100 mg Oral Daily Tarry Kos A, MD   100 mg at 01/14/17 0933  . zolpidem (AMBIEN) tablet 10 mg  10 mg Oral QHS Haydee Monica, MD   10 mg at 01/13/17 2104     Discharge Medications: Please see discharge summary for a list of discharge medications.  Relevant Imaging Results:  Relevant Lab Results:   Additional Information SS#: [id number]  Baldemar Lenis, LCSW

## 2017-01-14 NOTE — Progress Notes (Signed)
Subjective:  Complains of headache.  Long rambling history about wanting to go back to his group home.  No seizure or syncope since admission.  Asking for salt.  No chest pain.  Complains of vague shortness of breath.  Objective:  Vital Signs in the last 24 hours: BP 120/68 (BP Location: Right Arm)   Pulse 78   Temp 98.5 F (36.9 C) (Oral)   Resp (!) 21   Ht 6\' 6"  (1.981 m)   Wt 105.4 kg (232 lb 5.8 oz)   SpO2 96%   BMI 26.85 kg/m   Physical Exam: Middle-age black male in no acute distress  Lungs:  Clear Cardiac:  Irregular rhythm, normal S1 and S2, no S3 Abdomen:  Soft, nontender, no masses Extremities:  No edema present  Intake/Output from previous day: 10/26 0701 - 10/27 0700 In: 480 [P.O.:480] Out: 1200 [Urine:1200]  Weight Filed Weights   01/10/17 1750  Weight: 105.4 kg (232 lb 5.8 oz)   CBC:  Recent Labs  01/13/17 0235 01/14/17 0303  WBC 7.4 7.5  HGB 13.0 13.2  HCT 37.0* 38.4*  MCV 97.1 97.0  PLT 251 236   Cardiac Panel (last 3 results)  Recent Labs  01/11/17 1451 01/11/17 2047  TROPONINI <0.03 <0.03    Telemetry: Personally reviewed.  Currently atrial flutter with controlled response  Assessment/Plan:  1.  Atrial flutter with controlled ventricular response 2.  Hypertensive heart disease 3.  Long-term anticoagulation  Recommendations:  Social issues as noted above and awaiting placement.  Plan is for him to return for an ablation of atrial flutter following a period of anticoagulation since was not able to get TEE.     Darden PalmerW. Spencer Thatcher Doberstein, Jr.  MD [facility]  01/14/2017, 11:58 AM

## 2017-01-15 LAB — BASIC METABOLIC PANEL
Anion gap: 9 (ref 5–15)
BUN: 8 mg/dL (ref 6–20)
CALCIUM: 8.4 mg/dL — AB (ref 8.9–10.3)
CO2: 23 mmol/L (ref 22–32)
CREATININE: 0.64 mg/dL (ref 0.61–1.24)
Chloride: 105 mmol/L (ref 101–111)
GFR calc non Af Amer: >60 mL/min (ref >60)
GLUCOSE: 101 mg/dL — AB (ref 65–99)
Potassium: 3.6 mmol/L (ref 3.5–5.1)
Sodium: 137 mmol/L (ref 135–145)

## 2017-01-15 LAB — CBC
HEMATOCRIT: 37.8 % — AB (ref 39.0–52.0)
Hemoglobin: 13.5 g/dL (ref 13.0–17.0)
MCH: 34.4 pg — ABNORMAL HIGH (ref 26.0–34.0)
MCHC: 35.7 g/dL (ref 30.0–36.0)
MCV: 96.2 fL (ref 78.0–100.0)
Platelets: 223 10*3/uL (ref 150–400)
RBC: 3.93 MIL/uL — ABNORMAL LOW (ref 4.22–5.81)
RDW: 12.1 % (ref 11.5–15.5)
WBC: 7.2 10*3/uL (ref 4.0–10.5)

## 2017-01-15 MED ORDER — DILTIAZEM HCL 60 MG PO TABS
60.0000 mg | ORAL_TABLET | Freq: Once | ORAL | Status: AC
  Administered 2017-01-15: 60 mg via ORAL
  Filled 2017-01-15: qty 1

## 2017-01-15 MED ORDER — DILTIAZEM HCL ER COATED BEADS 240 MG PO CP24
240.0000 mg | ORAL_CAPSULE | Freq: Every day | ORAL | Status: DC
  Administered 2017-01-16 – 2017-01-20 (×5): 240 mg via ORAL
  Filled 2017-01-15 (×5): qty 1
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
## 2017-01-15 NOTE — Progress Notes (Signed)
[facility] TEAM 1 - Stepdown/ICU TEAM  [name] [name]  JXB:[id number] DOB: 1960/08/17 DOA: 01/10/2017 PCP: Mortimer Fries, PA    Brief Narrative:  55 y.o.M HxBipolar, EtOH abuse, Dementia, Seizure disorder, Hep C, HTN, Hereditary and idiopathic peripheral neuropathy, and a cerebral aneurysm S/P surgery at [facility] who presented following a syncopal episode at his [name]/group home. His heart rate was 200 when EMSarrived. He was given adenosine twice with no results. In ED given cardizem bolus and drip and rate dropped to 110 range and noted to be aflutter.   Subjective: Had an episode of recurrent RVR this morning w/ HR to 200 and accompanying chest pressure.  Resolved spontaneously after a few minutes.  Continues to report HA and some very minimal chest pressure, but focused primarily on going back to his prior living arrangement.    Assessment & Plan:  A flutter with RVR Care as per Cardiology - CHA2DS2-VASc is 3 - attempted TEE 10/25 unsuccessful due to inability to pass probe - plan now is to anticoagulatefor 4 weeks >elective RF ablation >another month of anticoag - very poor candidate for long-term anticoagulation given his mental health issues, substance abuse, and frequent falls/seizure activity - rate controlled on oral CCB overall, but did have an episode of RVR this morning - need to follow another 24hrs on tele to assure is a limited occurence  Chest pain Appears to have been related to RVR - recurred w/ RVR but has again ceased w/ slowing of HR   EtOH abuse - ?Polysusbtance abuse  UDS positive opioids (not in patient's home medication) - Ethanol level negative at presentation - patient counseled at length on not using alcohol or other illegal substances when on anticoagulant  Seizure disorder - Intractable Seizures  Cont current seizure medications - f/u w/ his outpt Neuro doctor asap after d/c - appears to have had significant thrombocytopenia on depakote - was to be  on Keppra and Vimpat as outpt per records in Epic - Tegretol was stopped in Feb 2017 due to hyponatremia - have resumed regimen documented at time of his last Neuro appointment   Bipolar D/O Deemed incompetent per Psych - some agitation but easily redirectable at this time   Chronic hepatitis C  Disposition  Initial plan was for the patient to return to his assisted living facility - PT/OT evaluated the patient however and felt that he would be most appropriate at a higher level of care/SNF - the patient is refusing SNF and wishes to return to his [name] however the [name] will not accept the patient given that it is clear he now requires a higher level of care - the patient has been deemed incompetent to make an appropriate decision per Psychiatry - if remains medically stable will be ready for disposition 10/29 - will ask PT to see one more time before d/c as pt very strongly opposed to any dispo except returning to prior [name]  DVT prophylaxis: eliquis Code Status: FULL CODE Family Communication: no family present at time of exam  Disposition Plan: pending d/c next 24-48hrs   Consultants:  Cardiology  Psychiatry   Antimicrobials:  none  Objective: Blood pressure (!) 138/94, pulse 91, temperature 97.8 F (36.6 C), temperature source Oral, resp. rate 14, height 6\' 6"  (1.981 m), weight 105.4 kg (232 lb 5.8 oz), SpO2 97 %.  Intake/Output Summary (Last 24 hours) at 01/15/17 1205 Last data filed at 01/15/17 1100  Gross per 24 hour  Intake  480 ml  Output             2925 ml  Net            -2445 ml   Filed Weights   01/10/17 1750  Weight: 105.4 kg (232 lb 5.8 oz)    Examination: General: No acute respiratory distress - pressured speech  Lungs: Clear to auscultation bilaterally - no crackles  Cardiovascular: RRR at time of my visit - clear flutter waves on tele  Abdomen: NT/ND, soft, bs+ Extremities: No edema B LE   CBC:  Recent Labs Lab 01/10/17 0747   01/11/17 1451 01/12/17 0220 01/13/17 0235 01/14/17 0303 01/15/17 0135  WBC 13.1*  < > 10.7* 8.2 7.4 7.5 7.2  NEUTROABS 5.8  --   --   --   --   --   --   HGB 14.5  < > 14.1 13.1 13.0 13.2 13.5  HCT 41.3  < > 40.5 38.2* 37.0* 38.4* 37.8*  MCV 97.9  < > 97.1 97.7 97.1 97.0 96.2  PLT 300  < > 268 274 251 236 223  < > = values in this interval not displayed. Basic Metabolic Panel:  Recent Labs Lab 01/10/17 0747 01/11/17 0037 01/12/17 0220 01/15/17 0135  NA 137 136  --  137  K 4.0 4.0  --  3.6  CL 107 107  --  105  CO2 20* 21*  --  23  GLUCOSE 125* 97  --  101*  BUN 14 13  --  8  CREATININE 0.97 0.82  --  0.64  CALCIUM 8.6* 8.4*  --  8.4*  MG  --   --  1.9  --    GFR: Estimated Creatinine Clearance: 130.1 mL/min (by C-G formula based on SCr of 0.64 mg/dL).  Liver Function Tests:  Recent Labs Lab 01/10/17 0747  AST 25  ALT 16*  ALKPHOS 71  BILITOT 0.8  PROT 7.8  ALBUMIN 3.2*   Cardiac Enzymes:  Recent Labs Lab 01/10/17 1822 01/11/17 0037 01/11/17 0929 01/11/17 1451 01/11/17 2047  TROPONINI <0.03 <0.03 <0.03 <0.03 <0.03    HbA1C: Hgb A1c MFr Bld  Date/Time Value Ref Range Status  01/24/2014 05:00 AM 5.7 (H) <5.7 % Final    Comment:    (NOTE)                                                                       According to the ADA Clinical Practice Recommendations for 2011, when HbA1c is used as a screening test:  >=6.5%   Diagnostic of Diabetes Mellitus           (if abnormal result is confirmed) 5.7-6.4%   Increased risk of developing Diabetes Mellitus References:Diagnosis and Classification of Diabetes Mellitus,Diabetes Care,2011,34(Suppl 1):S62-S69 and Standards of Medical Care in         Diabetes - 2011,Diabetes Care,2011,34 (Suppl 1):S11-S61.     Recent Results (from the past 240 hour(s))  MRSA PCR Screening     Status: None   Collection Time: 01/10/17  5:57 PM  Result Value Ref Range Status   MRSA by PCR NEGATIVE NEGATIVE Final     Comment:        The GeneXpert MRSA Assay (FDA  approved for NASAL specimens only), is one component of a comprehensive MRSA colonization surveillance program. It is not intended to diagnose MRSA infection nor to guide or monitor treatment for MRSA infections.      Scheduled Meds: . acetaminophen  650 mg Oral BID  . apixaban  5 mg Oral Q12H  . cholecalciferol  1,000 Units Oral Daily  . diltiazem  180 mg Oral Daily  . lacosamide  100 mg Oral BID  . levETIRAcetam  1,500 mg Oral BID  . Melatonin  3 mg Oral QHS  . metoprolol tartrate  25 mg Oral BID  . QUEtiapine  50 mg Oral Daily  . sertraline  100 mg Oral Daily  . zolpidem  10 mg Oral QHS     LOS: 4 days   Lonia BloodJeffrey T. Afsheen Antony, MD [facility] Office  [phone] Pager - Text Page per Amion as per below:  On-Call/Text Page:      [email or web address]      password [email or web address]  If 7PM-7AM, please contact night-coverage [email or web address] Password [email or web address] 01/15/2017, 12:05 PM

## 2017-01-15 NOTE — Progress Notes (Signed)
Subjective:  Complains of headache.  Continues with long rambling history about wanting to go back to his group home.    No chest pain.  Complains of vague shortness of breath.  Objective:  Vital Signs in the last 24 hours: BP (!) 138/94 (BP Location: Right Wrist)   Pulse 90   Temp 97.8 F (36.6 C) (Oral)   Resp 14   Ht 6\' 6"  (1.981 m)   Wt 105.4 kg (232 lb 5.8 oz)   SpO2 97%   BMI 26.85 kg/m   Physical Exam: Middle-age black male in no acute distress , rambling historian complaining of headache Lungs:  Clear Cardiac:  Irregular rhythm, normal S1 and S2, no S3 Abdomen:  Soft, nontender, no masses Extremities:  No edema present  Intake/Output from previous day: 10/27 0701 - 10/28 0700 In: 480 [P.O.:480] Out: 3100 [Urine:3100]  Weight Filed Weights   01/10/17 1750  Weight: 105.4 kg (232 lb 5.8 oz)   CBC:  Recent Labs  01/14/17 0303 01/15/17 0135  WBC 7.5 7.2  HGB 13.2 13.5  HCT 38.4* 37.8*  MCV 97.0 96.2  PLT 236 223   Telemetry: Personally reviewed.  Currently atrial flutter with controlled response  Assessment/Plan:  1.  Atrial flutter with controlled ventricular response 2.  Hypertensive heart disease 3.  Long-term anticoagulation  Recommendations:  Social issues as noted above and awaiting placement.  Plan is for him to return for an ablation of atrial flutter following a period of anticoagulation since was not able to get TEE.  No change since yesterday.     Darden PalmerW. Spencer Danyelle Brookover, Jr.  MD [facility] Cardiology  01/15/2017, 10:42 AM

## 2017-01-16 LAB — COMPREHENSIVE METABOLIC PANEL
ALBUMIN: 2.9 g/dL — AB (ref 3.5–5.0)
ALT: 19 U/L (ref 17–63)
AST: 29 U/L (ref 15–41)
Alkaline Phosphatase: 92 U/L (ref 38–126)
Anion gap: 8 (ref 5–15)
BILIRUBIN TOTAL: 0.4 mg/dL (ref 0.3–1.2)
BUN: 11 mg/dL (ref 6–20)
CHLORIDE: 107 mmol/L (ref 101–111)
CO2: 23 mmol/L (ref 22–32)
Calcium: 8.6 mg/dL — ABNORMAL LOW (ref 8.9–10.3)
Creatinine, Ser: 0.74 mg/dL (ref 0.61–1.24)
GFR calc Af Amer: >60 mL/min (ref >60)
GFR calc non Af Amer: >60 mL/min (ref >60)
GLUCOSE: 104 mg/dL — AB (ref 65–99)
POTASSIUM: 4 mmol/L (ref 3.5–5.1)
Sodium: 138 mmol/L (ref 135–145)
TOTAL PROTEIN: 6.9 g/dL (ref 6.5–8.1)

## 2017-01-16 LAB — CBC
HEMATOCRIT: 39.3 % (ref 39.0–52.0)
Hemoglobin: 13.5 g/dL (ref 13.0–17.0)
MCH: 33.5 pg (ref 26.0–34.0)
MCHC: 34.4 g/dL (ref 30.0–36.0)
MCV: 97.5 fL (ref 78.0–100.0)
PLATELETS: 218 10*3/uL (ref 150–400)
RBC: 4.03 MIL/uL — ABNORMAL LOW (ref 4.22–5.81)
RDW: 12.2 % (ref 11.5–15.5)
WBC: 7.8 10*3/uL (ref 4.0–10.5)

## 2017-01-16 LAB — MAGNESIUM: MAGNESIUM: 1.7 mg/dL (ref 1.7–2.4)
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
## 2017-01-16 NOTE — Progress Notes (Signed)
[facility] TEAM 1 - Stepdown/ICU TEAM  [name] [name]  ATF:[id number] DOB: [birth year] DOA: 01/10/2017 PCP: Mortimer Friesurl, David, PA    Brief Narrative:  56 y.o.M HxBipolar, EtOH abuse, Dementia, Seizure disorder, Hep C, HTN, Hereditary and idiopathic peripheral neuropathy, and a [facility] who presented following a syncopal episode at his [name]/group home. His heart rate was 200 when EMSarrived. He was given adenosine twice with no results. In ED given cardizem bolus and drip and rate dropped to 110 range and noted to be aflutter.   Subjective: Patient is adamant that he wishes to return to his former assisted living facility.  Disposition will prove to be quite difficult as the patient is not competent to make his own decisions but is still very much alert and aware and  can become combative/aggressive at times.  I have agreed to ask physical therapy to reevaluate him today to assure that he still needs SNF placement versus returning to his [name].  He denies chest pain shortness of breath fevers or chills.  He has a good appetite and good intake.  Assessment & Plan:  A flutter with RVR Care as per Cardiology - CHA2DS2-VASc is 3 - attempted TEE 10/25 unsuccessful due to inability to pass probe - plan now is to anticoagulatefor 4 weeks >elective RF ablation >another month of anticoag - very poor candidate for long-term anticoagulation given his mental health issues, substance abuse, and frequent falls/seizure activity - rate controlled on oral CCB at this time   Chest pain Appears to have been related to RVR   EtOH abuse - ?Polysusbtance abuse  UDS positive opioids (not in patient's home medication) - Ethanol level negative at presentation - patient counseled at length on not using alcohol or other illegal substances when on anticoagulant  Seizure disorder - Intractable Seizures  Cont current seizure medications - f/u w/ his outpt Neuro doctor asap after d/c  - appears to have had significant thrombocytopenia on depakote - was to be on Keppra and Vimpat as outpt per records in Epic - Tegretol was stopped in Feb 2017 due to hyponatremia - have resumed regimen documented at time of his last Neuro appointment   Bipolar D/O Deemed incompetent per Psych - some agitation but easily redirectable at this time - may be challenging to force him into a d/c disposition he does not agree with   Chronic hepatitis C  Disposition  Initial plan was for the patient to return to his assisted living facility - PT/OT evaluated the patient however and felt that he would be most appropriate at a higher level of care/SNF - the patient is refusing SNF and wishes to return to his [name] however the [name] will not accept the patient given that he now requires a higher level of care - the patient has been deemed incompetent to make an appropriate medical decision per Psychiatry - will ask PT to see one more time before d/c as pt very strongly opposed to any dispo except returning to prior [name] - if PT continues to feel SNF is required, the pt is medically cleared at this point for d/c   DVT prophylaxis: eliquis Code Status: FULL CODE Family Communication: no family present at time of exam  Disposition Plan: pending d/c w/ venue dependent upon PT re-evaluation   Consultants:  Cardiology  Psychiatry   Antimicrobials:  none  Objective: Blood pressure 131/86, pulse 96, temperature 97.8 F (36.6 C), temperature source Oral, resp. rate 19, height 6'  6" (1.981 m), weight 105.4 kg (232 lb 5.8 oz), SpO2 100 %.  Intake/Output Summary (Last 24 hours) at 01/16/17 0931 Last data filed at 01/15/17 2109  Gross per 24 hour  Intake              480 ml  Output              950 ml  Net             -470 ml   Filed Weights   01/10/17 1750  Weight: 105.4 kg (232 lb 5.8 oz)    Examination: General: No acute respiratory distress - more calm today  Lungs: Clear to auscultation  bilaterally - no crackles or wheezing  Cardiovascular: RRR  Abdomen: NT/ND, soft, bs+, no rebound  Extremities: No edema B LE   CBC:  Recent Labs Lab 01/10/17 0747  01/12/17 0220 01/13/17 0235 01/14/17 0303 01/15/17 0135 01/16/17 0214  WBC 13.1*  < > 8.2 7.4 7.5 7.2 7.8  NEUTROABS 5.8  --   --   --   --   --   --   HGB 14.5  < > 13.1 13.0 13.2 13.5 13.5  HCT 41.3  < > 38.2* 37.0* 38.4* 37.8* 39.3  MCV 97.9  < > 97.7 97.1 97.0 96.2 97.5  PLT 300  < > 274 251 236 223 218  < > = values in this interval not displayed. Basic Metabolic Panel:  Recent Labs Lab 01/10/17 0747 01/11/17 0037 01/12/17 0220 01/15/17 0135 01/16/17 0214  NA 137 136  --  137 138  K 4.0 4.0  --  3.6 4.0  CL 107 107  --  105 107  CO2 20* 21*  --  23 23  GLUCOSE 125* 97  --  101* 104*  BUN 14 13  --  8 11  CREATININE 0.97 0.82  --  0.64 0.74  CALCIUM 8.6* 8.4*  --  8.4* 8.6*  MG  --   --  1.9  --  1.7   GFR: Estimated Creatinine Clearance: 130.1 mL/min (by C-G formula based on SCr of 0.74 mg/dL).  Liver Function Tests:  Recent Labs Lab 01/10/17 0747 01/16/17 0214  AST 25 29  ALT 16* 19  ALKPHOS 71 92  BILITOT 0.8 0.4  PROT 7.8 6.9  ALBUMIN 3.2* 2.9*   Cardiac Enzymes:  Recent Labs Lab 01/10/17 1822 01/11/17 0037 01/11/17 0929 01/11/17 1451 01/11/17 2047  TROPONINI <0.03 <0.03 <0.03 <0.03 <0.03    HbA1C: Hgb A1c MFr Bld  Date/Time Value Ref Range Status  01/24/2014 05:00 AM 5.7 (H) <5.7 % Final    Comment:    (NOTE)                                                                       According to the ADA Clinical Practice Recommendations for 2011, when HbA1c is used as a screening test:  >=6.5%   Diagnostic of Diabetes Mellitus           (if abnormal result is confirmed) 5.7-6.4%   Increased risk of developing Diabetes Mellitus References:Diagnosis and Classification of Diabetes Mellitus,Diabetes Care,2011,34(Suppl 1):S62-S69 and Standards of Medical Care in  Diabetes - 2011,Diabetes Care,2011,34 (Suppl 1):S11-S61.     Recent Results (from the past 240 hour(s))  MRSA PCR Screening     Status: None   Collection Time: 01/10/17  5:57 PM  Result Value Ref Range Status   MRSA by PCR NEGATIVE NEGATIVE Final    Comment:        The GeneXpert MRSA Assay (FDA approved for NASAL specimens only), is one component of a comprehensive MRSA colonization surveillance program. It is not intended to diagnose MRSA infection nor to guide or monitor treatment for MRSA infections.      Scheduled Meds: . acetaminophen  650 mg Oral BID  . apixaban  5 mg Oral Q12H  . cholecalciferol  1,000 Units Oral Daily  . diltiazem  240 mg Oral Daily  . lacosamide  100 mg Oral BID  . levETIRAcetam  1,500 mg Oral BID  . Melatonin  3 mg Oral QHS  . metoprolol tartrate  25 mg Oral BID  . QUEtiapine  50 mg Oral Daily  . sertraline  100 mg Oral Daily  . zolpidem  10 mg Oral QHS     LOS: 5 days   Lonia Blood, MD [facility] Office  [phone] Pager - Text Page per Amion as per below:  On-Call/Text Page:      [email or web address]      password [email or web address]  If 7PM-7AM, please contact night-coverage [email or web address] Password [email or web address] 01/16/2017, 9:31 AM

## 2017-01-16 NOTE — Progress Notes (Signed)
Progress Note  Patient Name: [name] [name] Date of Encounter: 01/16/2017  Primary Cardiologist: Dr. Jasper Rilingroitoru/Klein  Subjective   He has no cardiac complaints.  Only concerned about getting out to his previous residence.    Inpatient Medications    Scheduled Meds: . acetaminophen  650 mg Oral BID  . apixaban  5 mg Oral Q12H  . cholecalciferol  1,000 Units Oral Daily  . diltiazem  240 mg Oral Daily  . lacosamide  100 mg Oral BID  . levETIRAcetam  1,500 mg Oral BID  . Melatonin  3 mg Oral QHS  . metoprolol tartrate  25 mg Oral BID  . QUEtiapine  50 mg Oral Daily  . sertraline  100 mg Oral Daily  . zolpidem  10 mg Oral QHS   Continuous Infusions:  PRN Meds: acetaminophen, nitroGLYCERIN   Vital Signs    Vitals:   01/15/17 1100 01/15/17 1500 01/15/17 2103 01/16/17 0816  BP:  114/68 131/86 139/90  Pulse: 91 84 96 81  Resp:  20  19  Temp:  98.2 F (36.8 C)  97.8 F (36.6 C)  TempSrc:  Oral  Oral  SpO2:  100%    Weight:      Height:        Intake/Output Summary (Last 24 hours) at 01/16/17 1139 Last data filed at 01/15/17 2109  Gross per 24 hour  Intake              480 ml  Output              800 ml  Net             -320 ml   Filed Weights   01/10/17 1750  Weight: 232 lb 5.8 oz (105.4 kg)    Telemetry    Atrial flutter with variable rate but controlled - Personally Reviewed  ECG    NA - Personally Reviewed  Physical Exam   GEN: No acute distress.   Neck: No  JVD Cardiac: Irregular RR, no murmurs, rubs, or gallops.  Respiratory: Clear  to auscultation bilaterally. GI: Soft, nontender, non-distended  MS: No  edema; No deformity. Neuro:  Nonfocal  Psych: Normal affect   Labs    Chemistry Recent Labs Lab 01/10/17 0747 01/11/17 0037 01/15/17 0135 01/16/17 0214  NA 137 136 137 138  K 4.0 4.0 3.6 4.0  CL 107 107 105 107  CO2 20* 21* 23 23  GLUCOSE 125* 97 101* 104*  BUN 14 13 8 11   CREATININE 0.97 0.82 0.64 0.74  CALCIUM 8.6* 8.4*  8.4* 8.6*  PROT 7.8  --   --  6.9  ALBUMIN 3.2*  --   --  2.9*  AST 25  --   --  29  ALT 16*  --   --  19  ALKPHOS 71  --   --  92  BILITOT 0.8  --   --  0.4  GFRNONAA >60 >60 >60 >60  GFRAA >60 >60 >60 >60  ANIONGAP 10 8 9 8      Hematology Recent Labs Lab 01/14/17 0303 01/15/17 0135 01/16/17 0214  WBC 7.5 7.2 7.8  RBC 3.96* 3.93* 4.03*  HGB 13.2 13.5 13.5  HCT 38.4* 37.8* 39.3  MCV 97.0 96.2 97.5  MCH 33.3 34.4* 33.5  MCHC 34.4 35.7 34.4  RDW 12.0 12.1 12.2  PLT 236 223 218    Cardiac Enzymes Recent Labs Lab 01/11/17 0037 01/11/17 0929 01/11/17 1451 01/11/17 2047  TROPONINI <0.03 <  0.03 <0.03 <0.03    Recent Labs Lab 01/10/17 0908  TROPIPOC 0.03     BNPNo results for input(s): BNP, PROBNP in the last 168 hours.   DDimer No results for input(s): DDIMER in the last 168 hours.   Radiology    No results found.  Cardiac Studies   ECHO: Study Conclusions  - Left ventricle: The cavity size was normal. Wall thickness was   increased in a pattern of moderate LVH. Systolic function was   normal. The estimated ejection fraction was in the range of 55%   to 60%. Wall motion was normal; there were no regional wall   motion abnormalities. The study is not technically sufficient to   allow evaluation of LV diastolic function. - Mitral valve: There was mild regurgitation. - Right ventricle: The cavity size was mildly dilated. - Right atrium: The atrium was mildly dilated.  Patient Profile     56 y.o. male with bipolar, EtOH abuse, Dementia, Seizure disorder, Hep C, HTN, Hereditary and idiopathic peripheral neuropathy, and a cerebral aneurysm S/P surgery at [facility] who presented following a syncopal episode at his [name]/group home. His heart rate was 200 when EMSarrived. He was given adenosine twice with no results. In ED given cardizem bolus and drip and rate dropped to 110 range and noted to be aflutter.    Assessment & Plan    ATRIAL FLUTTER:    Plan  is for anticoagulation and then eventual ablation. It looks like his rate is OK and that he is on the correct dose of Cardizem and beta blocker.    Mr. [name] [name] has a CHA2DS2 - VASc score of 3.  Continue Eliquis.   Signed, Rollene Rotunda, MD  01/16/2017, 11:39 AM

## 2017-01-16 NOTE — Progress Notes (Signed)
Physical Therapy Treatment Patient Details Name: [name] [name] MRN: [id number] DOB: [birth year] Today's Date: 01/16/2017    History of Present Illness 56 y.o. male admitted after syncopal episode at [name].   He was found to have A-Flutter with RVR.  He was scheduled for an ablation, but MD was unable to pass the TEE ultrasound probe and procedure was cancelled.  He was started on oral anticoagulation.  PMH includes:  h/o brain aneurysm, seizure disorder, h/o ETOH abuse (has abstained > 1 year, per chart), hepatitis C, hereditary and ideopathic peripheral neuropathy.     PT Comments    Pt is making slow progress towards his goals but continues to be impulsive in his movement putting him at high risk for falls. Pt is supervision for bed mobility, but requires anywhere from supervision to modAx2 with transfers and ambulation of 150 feet with RW due to instability and LoB. Pt would benefit from safety and DME instruction as well as strength and balance training in a SNF environment before returning to the [name] he is currently living in.  Pt requires skilled PT in the acute setting to progress mobility and improve strength and endurance to safely navigate their discharge environment.   Follow Up Recommendations  SNF;Supervision/Assistance - 24 hour     Equipment Recommendations  Other (comment) (to be determined at next venue)    Recommendations for Other Services       Precautions / Restrictions Precautions Precautions: Fall Precaution Comments: Pt with h/o frequent falls  Restrictions Weight Bearing Restrictions: No    Mobility  Bed Mobility Overal bed mobility: Needs Assistance Bed Mobility: Sit to Supine       Sit to supine: Supervision   General bed mobility comments: supervision for safety  Transfers Overall transfer level: Needs assistance Equipment used: Rolling walker (2 wheeled) Transfers: Sit to/from Stand Sit to Stand: Min assist;Supervision         General  transfer comment: Pt was in bathroom on entry. Pt supervision for sit>stand from toilet, pt ambulated to bed and sat down with out control, falling backwards and requiring modA to stop from losing balance backwards.   Ambulation/Gait Ambulation/Gait assistance: Min assist;Mod assist;+2 safety/equipment Ambulation Distance (Feet): 150 Feet Assistive device: Rolling walker (2 wheeled) Gait Pattern/deviations: Step-through pattern;Decreased stride length;Staggering left;Staggering right;Drifts right/left;Trunk flexed;Wide base of support     General Gait Details: Pt ambulated with RW in hallways requiring second person assist due to pt highly impulsive and high risk for fall. Pt requires constant cuing to slow down, watch where he is going to manage RW around obstacles and to keep from running into wall. Pt continues to show no awareness of his poor safety even when he experiences loss of balance.          Balance Overall balance assessment: Needs assistance Sitting-balance support: Feet supported Sitting balance-[name]: Good Sitting balance - Comments: requires supervision    Standing balance support: Bilateral upper extremity supported;No upper extremity supported Standing balance-[name]: Poor Standing balance comment: Pt requires min A to maintain standing balance                             Cognition Arousal/Alertness: Awake/alert Behavior During Therapy: WFL for tasks assessed/performed;Impulsive Overall Cognitive Status: No family/caregiver present to determine baseline cognitive functioning  General Comments: Pt with good recall of info MD relayed to him, but unable generalize that info into how it migh affect him functionally.  He is extremely impulsive with poor safety awareness.  Pt is very tangential and difficult historian due to this          General Comments General comments (skin integrity, edema, etc.):  VSS throughout session      Pertinent Vitals/Pain Pain Assessment: Faces Faces Pain Scale: No hurt           PT Goals (current goals can now be found in the care plan section) Acute Rehab PT Goals Patient Stated Goal: Did not state  PT Goal Formulation: With patient Time For Goal Achievement: 01/27/17 Potential to Achieve Goals: Good Progress towards PT goals: Progressing toward goals    Frequency    Min 2X/week      PT Plan Current plan remains appropriate       AM-PAC PT "6 Clicks" Daily Activity  Outcome Measure  Difficulty turning over in bed (including adjusting bedclothes, sheets and blankets)?: None Difficulty moving from lying on back to sitting on the side of the bed? : None Difficulty sitting down on and standing up from a chair with arms (e.g., wheelchair, bedside commode, etc,.)?: A Lot Help needed moving to and from a bed to chair (including a wheelchair)?: A Lot Help needed walking in hospital room?: A Lot Help needed climbing 3-5 steps with a railing? : Total 6 Click Score: 15    End of Session Equipment Utilized During Treatment: Gait belt Activity Tolerance: Patient limited by fatigue Patient left: in bed;with call bell/phone within reach;with bed alarm set Nurse Communication: Mobility status PT Visit Diagnosis: Unsteadiness on feet (R26.81);Muscle weakness (generalized) (M62.81);History of falling (Z91.81)     Time: [id number] PT Time Calculation (min) (ACUTE ONLY): 18 min  Charges:  $Gait Training: 8-22 mins                    G Codes:       [name] [name] PT, DPT Acute Rehabilitation  [phone] Pager [phone]     [name] [name] 01/16/2017, 3:15 PM

## 2017-01-17 LAB — CBC
HCT: 39.6 % (ref 39.0–52.0)
Hemoglobin: 13.6 g/dL (ref 13.0–17.0)
MCH: 33.5 pg (ref 26.0–34.0)
MCHC: 34.3 g/dL (ref 30.0–36.0)
MCV: 97.5 fL (ref 78.0–100.0)
PLATELETS: 219 10*3/uL (ref 150–400)
RBC: 4.06 MIL/uL — AB (ref 4.22–5.81)
RDW: 12.3 % (ref 11.5–15.5)
WBC: 7.5 10*3/uL (ref 4.0–10.5)

## 2017-01-17 NOTE — Progress Notes (Signed)
[facility] TEAM 1 - Stepdown/ICU TEAM  [name] [name]  ZOX:[id number] DOB: 09-Jul-1960 DOA: 01/10/2017 PCP: Mortimer Fries, PA    Brief Narrative:  56 y.o.M HxBipolar, EtOH abuse, Dementia, Seizure disorder, Hep C, HTN, Hereditary and idiopathic peripheral neuropathy, and a cerebral aneurysm S/P surgery at [facility] who presented following a syncopal episode at his [name]/group home. His heart rate was 200 when EMSarrived. He was given adenosine twice with no results. In ED given cardizem bolus and drip and rate dropped to 110 range and noted to be aflutter.   Subjective: The patient is currently medically stable.  He is simply awaiting disposition.  This is proving to be a very complex issue.  In that my visits lead to discussion of his disposition which in turn leads to agitation, and given the fact that the patient is presently medically stable, I have elected not to visit him today.  I have reviewed his chart, and note the diligent work of CSW to place this patient appropriately.    Assessment & Plan:  A flutter with RVR Care as per Cardiology - CHA2DS2-VASc is 3 - attempted TEE 10/25 unsuccessful due to inability to pass probe - plan now is to anticoagulatefor 4 weeks >elective RF ablation >another month of anticoag - very poor candidate for long-term anticoagulation given his mental health issues, substance abuse, and frequent falls/seizure activity - rate controlled at this time   Chest pain Appears to have been related to RVR   EtOH abuse - ?Polysusbtance abuse  UDS positive opioids (not in patient's home medication) - Ethanol level negative at presentation - patient counseled at length on not using alcohol or other illegal substances when on anticoagulant  Seizure disorder - Intractable Seizures  Cont current seizure medications - f/u w/ his outpt Neuro doctor asap after d/c - appears to have had significant thrombocytopenia on depakote - Tegretol was stopped in Feb 2017 due  to hyponatremia - was to be on Keppra and Vimpat as outpt per records in [place] - have resumed regimen documented at time of his last Neuro appointment   Bipolar D/O Deemed incompetent per Psych - some agitation but easily redirectable at this time - may be challenging to force him into a d/c disposition he does not agree with - PT continues to suggest he requires a higher level of care - given that he is now on full anticoag assuring he has as much support post-discharge is of critical importance - hopefully the pt can be convinced that SNF is in his best interest  Chronic hepatitis C  Disposition  Initial plan was for the patient to return to his assisted living facility - PT/OT evaluated the patient however and felt that he would be most appropriate at a higher level of care/SNF - the patient is refusing SNF and wishes to return to his [name] however the [name] will not accept the patient given that he now requires a higher level of care - the patient has been deemed incompetent to make an appropriate medical decision per Psychiatry - PT continues to feel SNF is required - the pt is medically cleared at this point for d/c   DVT prophylaxis: eliquis Code Status: FULL CODE Family Communication:  Disposition Plan: pending d/c   Consultants:  Cardiology  Psychiatry   Antimicrobials:  none  Objective: Blood pressure 134/87, pulse 82, temperature 98.5 F (36.9 C), temperature source Oral, resp. rate (!) 29, height 6\' 6"  (1.981 m), weight 105.4 kg (232 lb 5.8  oz), SpO2 99 %.  Intake/Output Summary (Last 24 hours) at 01/17/17 1152 Last data filed at 01/17/17 0930  Gross per 24 hour  Intake              480 ml  Output             3300 ml  Net            -2820 ml   Filed Weights   01/10/17 1750  Weight: 105.4 kg (232 lb 5.8 oz)    Examination: No exam today   CBC:  Recent Labs Lab 01/13/17 0235 01/14/17 0303 01/15/17 0135 01/16/17 0214 01/17/17 0207  WBC 7.4 7.5 7.2 7.8 7.5  HGB  13.0 13.2 13.5 13.5 13.6  HCT 37.0* 38.4* 37.8* 39.3 39.6  MCV 97.1 97.0 96.2 97.5 97.5  PLT 251 236 223 218 219   Basic Metabolic Panel:  Recent Labs Lab 01/11/17 0037 01/12/17 0220 01/15/17 0135 01/16/17 0214  NA 136  --  137 138  K 4.0  --  3.6 4.0  CL 107  --  105 107  CO2 21*  --  23 23  GLUCOSE 97  --  101* 104*  BUN 13  --  8 11  CREATININE 0.82  --  0.64 0.74  CALCIUM 8.4*  --  8.4* 8.6*  MG  --  1.9  --  1.7   GFR: Estimated Creatinine Clearance: 130.1 mL/min (by C-G formula based on SCr of 0.74 mg/dL).  Liver Function Tests:  Recent Labs Lab 01/16/17 0214  AST 29  ALT 19  ALKPHOS 92  BILITOT 0.4  PROT 6.9  ALBUMIN 2.9*   Cardiac Enzymes:  Recent Labs Lab 01/10/17 1822 01/11/17 0037 01/11/17 0929 01/11/17 1451 01/11/17 2047  TROPONINI <0.03 <0.03 <0.03 <0.03 <0.03    HbA1C: Hgb A1c MFr Bld  Date/Time Value Ref Range Status  01/24/2014 05:00 AM 5.7 (H) <5.7 % Final    Comment:    (NOTE)                                                                       According to the ADA Clinical Practice Recommendations for 2011, when HbA1c is used as a screening test:  >=6.5%   Diagnostic of Diabetes Mellitus           (if abnormal result is confirmed) 5.7-6.4%   Increased risk of developing Diabetes Mellitus References:Diagnosis and Classification of Diabetes Mellitus,Diabetes Care,2011,34(Suppl 1):S62-S69 and Standards of Medical Care in         Diabetes - 2011,Diabetes Care,2011,34 (Suppl 1):S11-S61.     Recent Results (from the past 240 hour(s))  MRSA PCR Screening     Status: None   Collection Time: 01/10/17  5:57 PM  Result Value Ref Range Status   MRSA by PCR NEGATIVE NEGATIVE Final    Comment:        The GeneXpert MRSA Assay (FDA approved for NASAL specimens only), is one component of a comprehensive MRSA colonization surveillance program. It is not intended to diagnose MRSA infection nor to guide or monitor treatment for MRSA  infections.      Scheduled Meds: . acetaminophen  650 mg Oral BID  . apixaban  5 mg Oral  Q12H  . cholecalciferol  1,000 Units Oral Daily  . diltiazem  240 mg Oral Daily  . lacosamide  100 mg Oral BID  . levETIRAcetam  1,500 mg Oral BID  . Melatonin  3 mg Oral QHS  . metoprolol tartrate  25 mg Oral BID  . QUEtiapine  50 mg Oral Daily  . sertraline  100 mg Oral Daily  . zolpidem  10 mg Oral QHS     LOS: 6 days   Lonia Blood, MD [facility] Office  [phone] Pager - Text Page per Amion as per below:  On-Call/Text Page:      [email or web address]      password [email or web address]  If 7PM-7AM, please contact night-coverage [email or web address] Password [email or web address] 01/17/2017, 11:52 AM

## 2017-01-17 NOTE — Clinical Social Work Note (Addendum)
CSW called patient's friend, [name] [name]. She was in the lobby on her way to see the patient. Will discuss disposition with patient after their discussion. Patient was discussed in length of stay meeting. Per medical director, patient may not have capacity but he still has rights and [name] that is what he wants to do. CSW will have to call and discuss with [name] staff if he is still adamantly refusing SNF after discussion with friend.  [name] CourtSarah Aaryn [name], CSW [phone]  10:26 am CSW spoke with patient and [name] [name] at bedside. Patient is still adamantly refusing SNF placement and [name]. Patient states that he has two sons, one of which is in prison for 25 years in [place]. [name] [name] stated that ACTT team tried to tell him a year ago that his other son passed away but he did not understand that and still thinks he is alive. [name] [name] was seeking guardianship a year ago but lost track of patient as he was homeless. Now that she has located him, she plans to continue the guardianship process. CSW called and spoke with [name] [name] staff again. CSW explained that patient is adamantly refusing SNF and says he will only go back to the [name]. The staff member stated that MD is recommending SNF so he needs to do that. CSW expressed understanding but stated that patient will not go. She will come to the hospital to talk with the patient today.  [name] [name], CSW [phone]  12:09 pm CSW went to see patient. He is very upset that [name] [name] is refusing to take him back. He was on the phone with staff member CSW spoke with early, Mia? When he got off the phone he said she told him they were trying to "direct" him. Patient was speaking very loudly and his son, [name] [name], entered the room and asked the patient why he was speaking to CSW like that. Unit director and Chiropodistassistant director checked in  with CSW and made sure everything was okay. CSW made patient's son aware of PT recommendation. He tried to reason with patient and talk him into going to SNF but patient continued to refuse. Patient's son got upset stating that he keeps calling him to come visit but does not want his help so he was leaving. Patient continued to repeat stories that he has told CSW multiple times about his antibiotics causing the issue that led to his hospitalization and the [name] told him he would only be at the hospital for two hours but it has been six days.   [name] [name], CSW [phone]  12:48 pm CSW left message for assistant director of social work to discuss case.  [name] [name], CSW [phone]

## 2017-01-18 DIAGNOSIS — G40909 Epilepsy, unspecified, not intractable, without status epilepticus: Secondary | ICD-10-CM

## 2017-01-18 DIAGNOSIS — Z8679 Personal history of other diseases of the circulatory system: Secondary | ICD-10-CM

## 2017-01-18 DIAGNOSIS — F319 Bipolar disorder, unspecified: Secondary | ICD-10-CM

## 2017-01-18 DIAGNOSIS — Z9889 Other specified postprocedural states: Secondary | ICD-10-CM

## 2017-01-18 DIAGNOSIS — F1010 Alcohol abuse, uncomplicated: Secondary | ICD-10-CM

## 2017-01-18 DIAGNOSIS — B182 Chronic viral hepatitis C: Secondary | ICD-10-CM

## 2017-01-18 DIAGNOSIS — G43001 Migraine without aura, not intractable, with status migrainosus: Secondary | ICD-10-CM

## 2017-01-18 LAB — CBC
HCT: 39.6 % (ref 39.0–52.0)
Hemoglobin: 13.8 g/dL (ref 13.0–17.0)
MCH: 34.2 pg — AB (ref 26.0–34.0)
MCHC: 34.8 g/dL (ref 30.0–36.0)
MCV: 98.3 fL (ref 78.0–100.0)
PLATELETS: 208 10*3/uL (ref 150–400)
RBC: 4.03 MIL/uL — ABNORMAL LOW (ref 4.22–5.81)
RDW: 12.4 % (ref 11.5–15.5)
WBC: 8.1 10*3/uL (ref 4.0–10.5)

## 2017-01-18 MED ORDER — SUMATRIPTAN SUCCINATE 6 MG/0.5ML ~~LOC~~ SOLN
6.0000 mg | Freq: Once | SUBCUTANEOUS | Status: AC
  Administered 2017-01-18: 6 mg via SUBCUTANEOUS
  Filled 2017-01-18 (×2): qty 0.5

## 2017-01-18 NOTE — Progress Notes (Signed)
Pt transferred to med-surg per order in wheelchair without incident.

## 2017-01-18 NOTE — Progress Notes (Signed)
Physical Therapy Treatment Patient Details Name: [name] [name] MRN: [id number] DOB: 02-23-1961 Today's Date: 01/18/2017    History of Present Illness 56 y.o. male admitted after syncopal episode at [name].   He was found to have A-Flutter with RVR.  He was scheduled for an ablation, but MD was unable to pass the TEE ultrasound probe and procedure was cancelled.  He was started on oral anticoagulation.  PMH includes:  h/o brain aneurysm, seizure disorder, h/o ETOH abuse (has abstained > 1 year, per chart), hepatitis C, hereditary and ideopathic peripheral neuropathy.     PT Comments    Pt admitted with above diagnosis. Pt currently with functional limitations due to balance and endurance deficits. Pt sat up and then became upset that he needs SNF for safety and refused to work with PT.  Assisted back to bed.   Pt will benefit from skilled PT to increase their independence and safety with mobility to allow discharge to the venue listed below.     Follow Up Recommendations  SNF;Supervision/Assistance - 24 hour     Equipment Recommendations  Other (comment) (to be determined at next venue)    Recommendations for Other Services       Precautions / Restrictions Precautions Precautions: Fall Precaution Comments: Pt with h/o frequent falls  Restrictions Weight Bearing Restrictions: No    Mobility  Bed Mobility Overal bed mobility: Needs Assistance Bed Mobility: Supine to Sit     Supine to sit: Supervision     General bed mobility comments: supervision for safety.  Once pt sat up, decided he didn't want to walk because he got irritated that he needs to go to higher level of care sayijng he is going to lose his housing.   Transfers                    Ambulation/Gait                 Stairs            Wheelchair Mobility    Modified Rankin (Stroke Patients Only)       Balance Overall balance assessment: Needs assistance Sitting-balance support: No  upper extremity supported;Feet supported Sitting balance-[name]: Good Sitting balance - Comments: requires supervision due to impulsivity.                                    Cognition Arousal/Alertness: Awake/alert Behavior During Therapy: WFL for tasks assessed/performed;Impulsive Overall Cognitive Status: No family/caregiver present to determine baseline cognitive functioning                                 General Comments: Pt with poor understanding of plan for his medical care.  He is focused on that he has to go back to A living and not recognizing he needs more care.  He is extremely impulsive with poor safety awareness.  Pt is very tangential and difficult historian due to this       Exercises      General Comments General comments (skin integrity, edema, etc.): VSS      Pertinent Vitals/Pain Pain Assessment: No/denies pain    Home Living                      Prior Function  PT Goals (current goals can now be found in the care plan section) Acute Rehab PT Goals Patient Stated Goal: Did not state  Progress towards PT goals: Not progressing toward goals - comment (self limiting)    Frequency    Min 2X/week      PT Plan Current plan remains appropriate    Co-evaluation              AM-PAC PT "6 Clicks" Daily Activity  Outcome Measure  Difficulty turning over in bed (including adjusting bedclothes, sheets and blankets)?: None Difficulty moving from lying on back to sitting on the side of the bed? : None Difficulty sitting down on and standing up from a chair with arms (e.g., wheelchair, bedside commode, etc,.)?: A Lot Help needed moving to and from a bed to chair (including a wheelchair)?: A Lot Help needed walking in hospital room?: A Lot Help needed climbing 3-5 steps with a railing? : Total 6 Click Score: 15    End of Session   Activity Tolerance: Patient limited by fatigue Patient left: in  bed;with call bell/phone within reach;with bed alarm set Nurse Communication: Mobility status PT Visit Diagnosis: Unsteadiness on feet (R26.81);Muscle weakness (generalized) (M62.81);History of falling (Z91.81)     Time: [id number] PT Time Calculation (min) (ACUTE ONLY): 10 min  Charges:  $Therapeutic Activity: 8-22 mins                    G Codes:       Ladye Macnaughton,PT Acute Rehabilitation [phone])    Berline Lopesawn F Whyatt Klinger 01/18/2017, 9:57 PM

## 2017-01-18 NOTE — Progress Notes (Signed)
PROGRESS NOTE    [name] [name]  ZOX:[id number] DOB: [birth year] DOA: 01/10/2017 PCP: Mortimer Fries, PA   Brief Narrative:  56 y.o. BM PMHx Bipolar, EtOH abuse, Dementia, Seizure disorder, Hep c, HTN, Hereditary and idiopathic peripheral neuropathy, aneurysm S/P surgery at [facility]  Comes in with syncopal episode at his group home  Pt reports he "just fell out".  He is a very poor historian.  Denies any fevers.  No recent illnesses.  Earlier he was having a lot of chest pressure and his heart rate was 200 when ems arrived.  He was given adenosine twice with no results.  In ED given cardizem bolus and drip and rate dropped to 110 range and was actually aflutter which is new for pt.  Pt feels much better now that rate is down and his chest pressure is resolved.      Subjective: 10/31 A/O/4, negative CP, negative SOB, negative abdominal pain. Positive headache described as his chronic type headache since his aneurysm. Positive photosensitivity. States tried Tylenol without effect.       Assessment & Plan:   Principal Problem:   Atrial flutter with rapid ventricular response (HCC) Active Problems:   Chronic liver disease   Chronic hepatitis C without hepatic coma (HCC)   Convulsions/seizures (HCC)   S/P cerebral aneurysm repair   Dementia with behavioral disturbance   Chronic pain syndrome   Syncope   A flutter with RVR -Eliquis 5 mg WJX:BJYNWGNF for additional 4 weeks and then follow-up for DCCV -Diltiazem 240 mg daily -Metoprolol 25 mg BID -Echocardiogram: Moderate LVH see results below -Attempted TEE today unsuccessfully. Will have to start patient on anticoagulant for 4 weeks. Patient a very poor candidate for long-term anticoagulation given his mental health issues, substance abuse. -Follow-up appointment in 4 weeks with Dr. Sherryl Manges Cardiology for DCCV. 11/29  Chest pain/unstable angina -See A- flutter  EtOH abuse -UDS positive opioids (not in patient's home  medication) -Ethanol level negative -Patient counseled at length on not using alcohol or other illegal substances when on anticoagulant  Seizure disorder -lacosamide 100 mg BID -Keppra 1500 mg BID  Dementia/Bipolar  -Deemed incompetent per Psychiatry -Agitated secondary to feeling that assisted living facility is pointing again by having him fill out a significant amount of paperwork ensuring they did pay but now not allowing him to return. -PT recommends SNF secondary to patient's high fall risk and being anticoagulated  Aneurysm --Negative neurologic deficits on exam. Repair 2006 at [facility] patient  Migraine headache -Imitrex 6 mg subcutaneous 1  Chronic hepatitis C  Goals of care  Disposition  Initial plan was for the patient to return to his assisted living facility - PT/OT evaluated the patient however and felt that he would be most appropriate at a higher level of care/SNF.  - spoke with patient today explaining that currently ALFas refusing to allow him to return, we were trying to find a SNF which would meet his needs. Patient agitated and upset states to RN Zella Ball he does not want anticoagulation or DC CV anymore. - the patient has been deemed incompetent to make an appropriate medical decision per Psychiatry  - PT continues to feel SNF is required - the pt is medically cleared at this point for d/c     DVT prophylaxis: Eliquis Code Status: full Family Communication: none Disposition Plan: TBD: Awaiting callback from LCSW, what SNF will accept patient?Will [name] reconsider?   Consultants:  Cardiology Psychiatry    Procedures/Significant Events:  10/24 Echocardiogram:  Left ventricle: The cavity size was normal. Wall thickness was   increased in a pattern of moderate LVH. Systolic function was   normal. The estimated ejection fraction was in the range of 55%   to 60%. Wall motion was normal; there were no regional wall   motion abnormalities. The study is  not technically sufficient to   allow evaluation of LV diastolic function. - Mitral valve: There was mild regurgitation. - Right ventricle: The cavity size was mildly dilated. - Right atrium: The atrium was mildly dilated. 10/25 TEE; unsuccessful attempt TEE.Multiple attempts made to pass ultrasound probe. Unable to assess for LA thrombus   I have personally reviewed and interpreted all radiology studies and my findings are as above.  VENTILATOR SETTINGS:    Cultures none  Antimicrobials: none   Devices none   LINES / TUBES:  none   Continuous Infusions:    Objective: Vitals:   01/17/17 1542 01/17/17 2001 01/17/17 2346 01/18/17 0333  BP: 99/64 132/88 115/76 122/82  Pulse:      Resp: 16 (!) 21 18 16   Temp: 98.3 F (36.8 C) 98.5 F (36.9 C) (!) 97.1 F (36.2 C) 98.6 F (37 C)  TempSrc: Oral Oral Axillary Oral  SpO2: 97% 98% 94% 94%  Weight:      Height:        Intake/Output Summary (Last 24 hours) at 01/18/17 0719 Last data filed at 01/17/17 2350  Gross per 24 hour  Intake              840 ml  Output             2600 ml  Net            -1760 ml   Filed Weights   01/10/17 1750  Weight: 232 lb 5.8 oz (105.4 kg)    Physical Exam:  General: A/O 4,No acute respiratory distress Eyes: negative scleral hemorrhage, negative anisocoria, negative icterus, positive photophobia ENT: Negative Runny nose, negative gingival bleeding,poor dentation Neck:  Negative scars, masses, torticollis, lymphadenopathy, JVD Lungs: Clear to auscultation bilaterally without wheezes or crackles Cardiovascular:Regular rhythm and rate without murmur gallop or rub normal S1 and S2 Abdomen: negative abdominal pain, nondistended, positive soft, bowel sounds, no rebound, no ascites, no appreciable mass Extremities: No significant cyanosis, clubbing, or edema bilateral lower extremities Skin: Negative rashes, lesions, ulcers Psychiatric:  Negative depression, negative anxiety,  negative fatigue, negative mania  Central nervous system:  Cranial nerves II through XII intact, tongue/uvula midline, all extremities muscle strength 5/5, sensation intact throughout,  negative dysarthria, negative expressive aphasia, negative receptive aphasia.   .     Data Reviewed: Care during the described time interval was provided by me .  I have reviewed this patient's available data, including medical history, events of note, physical examination, and all test results as part of my evaluation.   CBC:  Recent Labs Lab 01/14/17 0303 01/15/17 0135 01/16/17 0214 01/17/17 0207 01/18/17 0325  WBC 7.5 7.2 7.8 7.5 8.1  HGB 13.2 13.5 13.5 13.6 13.8  HCT 38.4* 37.8* 39.3 39.6 39.6  MCV 97.0 96.2 97.5 97.5 98.3  PLT 236 223 218 219 208   Basic Metabolic Panel:  Recent Labs Lab 01/12/17 0220 01/15/17 0135 01/16/17 0214  NA  --  137 138  K  --  3.6 4.0  CL  --  105 107  CO2  --  23 23  GLUCOSE  --  101* 104*  BUN  --  8  11  CREATININE  --  0.64 0.74  CALCIUM  --  8.4* 8.6*  MG 1.9  --  1.7   GFR: Estimated Creatinine Clearance: 130.1 mL/min (by C-G formula based on SCr of 0.74 mg/dL). Liver Function Tests:  Recent Labs Lab 01/16/17 0214  AST 29  ALT 19  ALKPHOS 92  BILITOT 0.4  PROT 6.9  ALBUMIN 2.9*   No results for input(s): LIPASE, AMYLASE in the last 168 hours. No results for input(s): AMMONIA in the last 168 hours. Coagulation Profile: No results for input(s): INR, PROTIME in the last 168 hours. Cardiac Enzymes:  Recent Labs Lab 01/11/17 0929 01/11/17 1451 01/11/17 2047  TROPONINI <0.03 <0.03 <0.03   BNP (last 3 results) No results for input(s): PROBNP in the last 8760 hours. HbA1C: No results for input(s): HGBA1C in the last 72 hours. CBG: No results for input(s): GLUCAP in the last 168 hours. Lipid Profile: No results for input(s): CHOL, HDL, LDLCALC, TRIG, CHOLHDL, LDLDIRECT in the last 72 hours. Thyroid Function Tests: No results for  input(s): TSH, T4TOTAL, FREET4, T3FREE, THYROIDAB in the last 72 hours. Anemia Panel: No results for input(s): VITAMINB12, FOLATE, FERRITIN, TIBC, IRON, RETICCTPCT in the last 72 hours. Urine analysis:    Component Value Date/Time   COLORURINE YELLOW 10/31/2014 1840   APPEARANCEUR CLEAR 10/31/2014 1840   LABSPEC 1.007 10/31/2014 1840   PHURINE 7.0 10/31/2014 1840   GLUCOSEU NEGATIVE 10/31/2014 1840   HGBUR NEGATIVE 10/31/2014 1840   BILIRUBINUR Small 10/19/2015 1526   KETONESUR NEGATIVE 10/31/2014 1840   PROTEINUR Negative 10/19/2015 1526   PROTEINUR NEGATIVE 10/31/2014 1840   UROBILINOGEN >=8.0 10/19/2015 1526   UROBILINOGEN 0.2 10/31/2014 1840   NITRITE Negative 10/19/2015 1526   NITRITE NEGATIVE 10/31/2014 1840   LEUKOCYTESUR Negative 10/19/2015 1526   Sepsis Labs: @LABRCNTIP (procalcitonin:4,lacticidven:4)  ) Recent Results (from the past 240 hour(s))  MRSA PCR Screening     Status: None   Collection Time: 01/10/17  5:57 PM  Result Value Ref Range Status   MRSA by PCR NEGATIVE NEGATIVE Final    Comment:        The GeneXpert MRSA Assay (FDA approved for NASAL specimens only), is one component of a comprehensive MRSA colonization surveillance program. It is not intended to diagnose MRSA infection nor to guide or monitor treatment for MRSA infections.          Radiology Studies: No results found.      Scheduled Meds: . acetaminophen  650 mg Oral BID  . apixaban  5 mg Oral Q12H  . cholecalciferol  1,000 Units Oral Daily  . diltiazem  240 mg Oral Daily  . lacosamide  100 mg Oral BID  . levETIRAcetam  1,500 mg Oral BID  . Melatonin  3 mg Oral QHS  . metoprolol tartrate  25 mg Oral BID  . QUEtiapine  50 mg Oral Daily  . sertraline  100 mg Oral Daily  . zolpidem  10 mg Oral QHS   Continuous Infusions:    LOS: 7 days    Time spent: 40 minutes    Mliss Wedin, Roselind Messier, MD [facility] Pager [phone]   If 7PM-7AM, please contact  night-coverage [email or web address] Password [email or web address] 01/18/2017, 7:19 AM

## 2017-01-18 NOTE — Progress Notes (Signed)
Attempted report X1

## 2017-01-18 NOTE — Clinical Social Work Note (Addendum)
[facility]. Mia is in a conference today and the staff member covering her does not know if she came to visit the patient yesterday. CSW left voicemail for Chiropodistassistant director of social work. Awaiting call back.  Charlynn CourtSarah Moreen Piggott, CSW [phone]  11:52 am CSW spoke with Chiropodistassistant director of social work. He reviewed case and is agreeable that patient can return to [name]. He asked that CSW call [facility] liaison [facility] to facilitate [name] taking patient back.  Charlynn CourtSarah Lin Glazier, CSW [phone]  12:07 pm Clinical liaison said the decision to not take patient back was made by upper management. She said she can assist with SNF placement for the patient. CSW asked what would happen if patient signed himself out. She stated that patient would be homeless if he did that. CSW discussed with Chiropodistassistant director of social work. He wants the [name] leadership to call him. CSW left voicemail for clinical liaison with Chiropodistassistant director of social work's phone number.  Charlynn CourtSarah Mata Rowen, CSW [phone]

## 2017-01-18 NOTE — Progress Notes (Signed)
1645 Received pt via wheelchair. A&O x4. Standby assist with transfers. Calm and cooperative.

## 2017-01-19 NOTE — Progress Notes (Signed)
Progress Note  Patient Name: [name] [name] Date of Encounter: 01/19/2017  Primary Cardiologist: Dr. Royann [name]  Subjective   Pt states he didn't sleep well last night and thinks he had a seizure because he had the date backwards. He denies chest pain but states he sometimes feels his heart pounding.   Inpatient Medications    Scheduled Meds: . acetaminophen  650 mg Oral BID  . apixaban  5 mg Oral Q12H  . cholecalciferol  1,000 Units Oral Daily  . diltiazem  240 mg Oral Daily  . lacosamide  100 mg Oral BID  . levETIRAcetam  1,500 mg Oral BID  . Melatonin  3 mg Oral QHS  . metoprolol tartrate  25 mg Oral BID  . QUEtiapine  50 mg Oral Daily  . sertraline  100 mg Oral Daily  . zolpidem  10 mg Oral QHS   Continuous Infusions:  PRN Meds: acetaminophen, nitroGLYCERIN   Vital Signs    Vitals:   01/18/17 1525 01/18/17 1647 01/18/17 2040 01/19/17 0635  BP: 97/69 121/72 115/84 (!) 142/94  Pulse: 83 81 81 83  Resp: 18 18 16 18   Temp: 97.8 F (36.6 C) (!) 97.5 F (36.4 C) 98.2 F (36.8 C) 97.6 F (36.4 C)  TempSrc: Oral Oral Oral   SpO2: 98% 95% 100% 94%  Weight:      Height:        Intake/Output Summary (Last 24 hours) at 01/19/17 0802 Last data filed at 01/19/17 4098  Gross per 24 hour  Intake             1060 ml  Output             4325 ml  Net            -3265 ml   Filed Weights   01/10/17 1750  Weight: 232 lb 5.8 oz (105.4 kg)    Telemetry    Pt not on telemetry after transfer - Personally Reviewed  ECG    No new tracings - Personally Reviewed  Physical Exam   GEN: No acute distress.   Neck: No JVD Cardiac: RRR, no murmurs, rubs, or gallops.  Respiratory: Clear to auscultation bilaterally. GI: Soft, nontender, non-distended  MS: No edema; No deformity. Neuro:  Nonfocal  Psych: Normal affect   Labs    Chemistry Recent Labs Lab 01/15/17 0135 01/16/17 0214  NA 137 138  K 3.6 4.0  CL 105 107  CO2 23 23  GLUCOSE 101* 104*  BUN 8 11    CREATININE 0.64 0.74  CALCIUM 8.4* 8.6*  PROT  --  6.9  ALBUMIN  --  2.9*  AST  --  29  ALT  --  19  ALKPHOS  --  92  BILITOT  --  0.4  GFRNONAA >60 >60  GFRAA >60 >60  ANIONGAP 9 8     Hematology Recent Labs Lab 01/16/17 0214 01/17/17 0207 01/18/17 0325  WBC 7.8 7.5 8.1  RBC 4.03* 4.06* 4.03*  HGB 13.5 13.6 13.8  HCT 39.3 39.6 39.6  MCV 97.5 97.5 98.3  MCH 33.5 33.5 34.2*  MCHC 34.4 34.3 34.8  RDW 12.2 12.3 12.4  PLT 218 219 208    Cardiac EnzymesNo results for input(s): TROPONINI in the last 168 hours. No results for input(s): TROPIPOC in the last 168 hours.   BNPNo results for input(s): BNP, PROBNP in the last 168 hours.   DDimer No results for input(s): DDIMER in the last 168 hours.  Radiology    No results found.  Cardiac Studies   ECHO 01/11/17: Study Conclusions - Left ventricle: The cavity size was normal. Wall thickness was increased in a pattern of moderate LVH. Systolic function was normal. The estimated ejection fraction was in the range of 55% to 60%. Wall motion was normal; there were no regional wall motion abnormalities. The study is not technically sufficient to allow evaluation of LV diastolic function. - Mitral valve: There was mild regurgitation. - Right ventricle: The cavity size was mildly dilated. - Right atrium: The atrium was mildly dilated.   Patient Profile     56 y.o. male with bipolar, EtOH abuse, Dementia, Seizure disorder, Hep C, HTN, Hereditary and idiopathic peripheral neuropathy, and a [facility] who presented following a syncopal episode at his [name]/group home. His heart rate was 200 when EMSarrived. He was given adenosine twice with no results. In ED given cardizem bolus and drip and rate dropped to 110 range and noted to be aflutter.   Assessment & Plan    1. Atrial flutter - This patients CHA2DS2-VASc Score and unadjusted Ischemic Stroke Rate (% per year) is equal to  3.2 % stroke rate/year from a score of 3 (HTN, CVA) - continue eliquis - question longterm anticoagulation given his frequent falls - continue lopressor and cardizem - unable to perform TEE (couldn't pass probe), ablation was canceled - will plan for ablation following 4 weeks of anticoagulation with eliquis - pulse on my exam was 96 bpm - pt states he sometimes feels his "heart pounding" - consider increasing lopressor to 50 mg BID - OK to discharge from cardiology standpoint      For questions or updates, please contact [facility] Please consult [email or web address] for contact info under Cardiology/STEMI.      Signed, [name] Rutherfordngela Nicole Duke, PA  01/19/2017, 8:02 AM     History and all data above reviewed.  Patient examined.  I agree with the findings as above.  He has no cardiac complaints.   The patient exam reveals ZOX:[id number]:Irregular  ,  Lungs: Clear  ,  Abd: Positive bowel sounds, no rebound no guarding, Ext No edema  .  All available labs, radiology testing, previous records reviewed. Agree with documented assessment and plan. Atrial flutter:  Plan as previously outlined.  If he has his meds administered at a nursing home of other then I would continue Eliquis secondary to fewer drug interactions. If however, He goes home without this supervision once daily Xarelto would be better but he would not be able to be on Tegretol.  EP follow up is arranged in the office. At that appt we can plan the flutter ablation.    [name] [name]  11:15 AM  01/19/2017

## 2017-01-19 NOTE — Progress Notes (Deleted)
Discharged home today via wheelchair with daughter. Discharged instructions, personal belongings given. Prescription given with instructions. Verbalized understanding of instructions. No further questions noted

## 2017-01-19 NOTE — Progress Notes (Signed)
Occupational Therapy Treatment Patient Details Name: [name] [name] MRN: [id number] DOB: [birth year] Today's Date: 01/19/2017    History of present illness 56 y.o. male admitted after syncopal episode at [name].   He was found to have A-Flutter with RVR.  He was scheduled for an ablation, but MD was unable to pass the TEE ultrasound probe and procedure was cancelled.  He was started on oral anticoagulation.  PMH includes:  h/o brain aneurysm, seizure disorder, h/o ETOH abuse (has abstained > 1 year, per chart), hepatitis C, hereditary and ideopathic peripheral neuropathy.    OT comments  Session limited to ADL at EOB, pt refusing OOB to chair or ambulation. Poor insight.   Follow Up Recommendations  SNF;Supervision/Assistance - 24 hour    Equipment Recommendations       Recommendations for Other Services      Precautions / Restrictions Precautions Precautions: Fall Precaution Comments: Pt with h/o frequent falls, impulsive.       Mobility Bed Mobility Overal bed mobility: Needs Assistance Bed Mobility: Supine to Sit;Sit to Supine     Supine to sit: Supervision Sit to supine: Supervision   General bed mobility comments: for safety, ones at EOB, decided he did not want to ambulate or sit up in chair, he wanted to stay in bed and watch TV  Transfers                 General transfer comment: pt refused    Balance Overall balance assessment: Needs assistance   Sitting balance-[name]: Good                                     ADL either performed or assessed with clinical judgement   ADL Overall ADL's : Needs assistance/impaired     Grooming: Wash/dry hands;Wash/dry face;Sitting;Set up                                       Vision       Perception     Praxis      Cognition Arousal/Alertness: Awake/alert Behavior During Therapy: [facility] for tasks assessed/performed;Impulsive Overall Cognitive Status: History of cognitive  impairments - at baseline                                 General Comments: Pt with poor insight.        Exercises     Shoulder Instructions       General Comments      Pertinent Vitals/ Pain       Pain Assessment: No/denies pain  Home Living                                          Prior Functioning/Environment              Frequency  Min 2X/week        Progress Toward Goals  OT Goals(current goals can now be found in the care plan section)  Progress towards OT goals: Not progressing toward goals - comment (self limiting)  Acute Rehab OT Goals Patient Stated Goal: to watch TV OT Goal Formulation: With patient Time For Goal Achievement: 01/27/17 Potential  to Achieve Goals: Fair  Plan Discharge plan remains appropriate    Co-evaluation                 AM-PAC PT "6 Clicks" Daily Activity     Outcome Measure   Help from another person eating meals?: None Help from another person taking care of personal grooming?: A Little Help from another person toileting, which includes using toliet, bedpan, or urinal?: A Little Help from another person bathing (including washing, rinsing, drying)?: A Little Help from another person to put on and taking off regular upper body clothing?: A Little Help from another person to put on and taking off regular lower body clothing?: A Little 6 Click Score: 19    End of Session    OT Visit Diagnosis: Unsteadiness on feet (R26.81);Cognitive communication deficit (R41.841)   Activity Tolerance  (self limiting)   Patient Left in bed;with call bell/phone within reach;with bed alarm set;with nursing/sitter in room   Nurse Communication          Time: [id number] OT Time Calculation (min): 13 min  Charges: OT General Charges $OT Visit: 1 Visit OT Treatments $Self Care/Home Management : 8-22 mins  01/19/2017 Martie RoundJulie Adelei Scobey, OTR/L Pager: [phone]   Iran PlanasMayberry, [place] BailiffJulie Lynn 01/19/2017,  3:23 PM

## 2017-01-19 NOTE — Clinical Social Work Note (Addendum)
Surveyor, quantity of social work spoke with Nutritional therapist at [facility] this morning. They will not take patient back. Surveyor, quantity of social work plans to make report to the state. He asked that CSW speak with patient and contact patient's son. CSW spoke with patient to update him and provide SNF bed offers: [facility], [facility], and [facility]. Discussed that his Medicaid check will go to the facility minus $30. Patient appears much calmer today during discussion and wants CSW to call [name], the captain of team two with the ACTT team to see if they can help him figure out a plan. CSW left voicemail with contact information. CSW called patient's son and provided bed offers to him as well. He is not familiar with these facilities. CSW discussed patient's request for CSW to contact ACTT team. Patient's son said if patient wants ACTT team to make his decisions, he is fine with that. If they are unable to, he will step in to make decisions. Patient's son is coming to the hospital in about 30 minutes. CSW provided new room number. CSW received call from clinical liaison for [facility]. She and their administrator are coming to the hospital to meet with the patient. She stated that patient is too high functioning to be at their facility and was not sure how he had been able to stay this long. CSW asked if CSW should continue PASARR screening process for SNF. Patient has [name] PASARR. Clinical liaison said to wait until after they talk to him. They will be at the hospital in about 15 minutes.  [name] [name], [place] [phone]  12:10 pm CSW met with director of [facility], [name] [name]. She presented CSW with "Notice of Transfer/Discharge" which is dated for today. The reason on the document is listed as "For your welfare and safety, your needs cannot be met in this facility." They are going to the patient's room now to present to him and his son who is in the room. CSW spoke with patient's son on the  phone prior to them arriving and he plans on touring the SNFs that extended bed offers.  [name] [name], CSW [phone]  1:34 pm CSW met with clinical liaison. She stated patient and his son were served the discharge paper and the discussion went well. CSW started PASARR screening. PASARR under manual review due to bipolar and seizure disorder diagnoses.  [name] [name], [place] [phone]  3:02 pm Requested documentation faxed to [name] for PASARR review.  [name] [name], [place]

## 2017-01-19 NOTE — Progress Notes (Signed)
PROGRESS NOTE    [name] [name]  ZOX:[id number] DOB: October 19, 1960 DOA: 01/10/2017 PCP: Mortimer Friesurl, [place], [place]   Brief Narrative: [name] AlexanderFennell Borenstein is a 56 y.o. with a history of Bipolar, EtOH abuse, Dementia, Seizure disorder, Hep c, HTN, Hereditary and idiopathic peripheral neuropathy, aneurysm S/P [facility]  Comes in with syncopal episode at his group home Pt reports he "just fell out". He is a very poor historian. Denies any fevers. No recent illnesses. Earlier he was having a lot of chest pressure and his heart rate was 200 when ems arrived. He was given adenosine twice with no results. In ED given cardizem bolus and drip and rate dropped to 110 range and was actually aflutter which is new for pt. Pt feels much better now that rate is down and his chest pressure is improved   Assessment & Plan:   Principal Problem:   Atrial flutter with rapid ventricular response (HCC) Active Problems:   Chronic liver disease   Chronic hepatitis C without hepatic coma (HCC)   Convulsions/seizures (HCC)   S/P cerebral aneurysm repair   Dementia with behavioral disturbance   Chronic pain syndrome   Syncope   Atrial flutter with RVR Resolved. DCCV attempted and failed. -cardiology recommendations: Eliquis, repeat attempt for DCCV in 4 weeks -continue diltiazem; metoprolol  Chest pain Unstable angina Atypical. Reproducible.   Ethanol abuse Counseled.  Seizure disorder -continue lacosamide and Keppra  Dementia Bipolar disorder Patient does not have capacity  Aneurysm S/p repair in 2006.  Migraine headache Improved. S/p Imitrex  Chronic hepatitis C   DVT prophylaxis: Eliquis Code Status: Full code Family Communication: None at bedside Disposition Plan: Discharge to SNF. Medically stable for discharge.   Consultants:   Cardiology  Procedures:   DCCV (attempted)  Antimicrobials:  None    Subjective: Right sided chest pain that has improved.  Headache has also improved.  Objective: Vitals:   01/18/17 1647 01/18/17 2040 01/19/17 0635 01/19/17 1405  BP: 121/72 115/84 (!) 142/94 116/64  Pulse: 81 81 83 100  Resp: 18 16 18 18   Temp: (!) 97.5 F (36.4 C) 98.2 F (36.8 C) 97.6 F (36.4 C) 97.6 F (36.4 C)  TempSrc: Oral Oral  Oral  SpO2: 95% 100% 94% 97%  Weight:      Height:        Intake/Output Summary (Last 24 hours) at 01/19/17 1450 Last data filed at 01/19/17 1406  Gross per 24 hour  Intake              820 ml  Output             3700 ml  Net            -2880 ml   Filed Weights   01/10/17 1750  Weight: 105.4 kg (232 lb 5.8 oz)    Examination:  General exam: Appears calm and comfortable Respiratory system: Clear to auscultation. Respiratory effort normal. Cardiovascular system: S1 & S2 heard, RRR. No murmurs, rubs, gallops or clicks. Gastrointestinal system: Abdomen is nondistended, soft and nontender. No organomegaly or masses felt. Normal bowel sounds heard. Central nervous system: Alert and oriented. No focal neurological deficits. Extremities: No edema. No calf tenderness Skin: No cyanosis. No rashes Psychiatry: Judgement and insight appear impaired. Mood & affect normal and flat.     Data Reviewed: I have personally reviewed following labs and imaging studies  CBC:  Recent Labs Lab 01/14/17 0303 01/15/17 0135 01/16/17 0214 01/17/17 0207 01/18/17 0325  WBC 7.5  7.2 7.8 7.5 8.1  HGB 13.2 13.5 13.5 13.6 13.8  HCT 38.4* 37.8* 39.3 39.6 39.6  MCV 97.0 96.2 97.5 97.5 98.3  PLT 236 223 218 219 208   Basic Metabolic Panel:  Recent Labs Lab 01/15/17 0135 01/16/17 0214  NA 137 138  K 3.6 4.0  CL 105 107  CO2 23 23  GLUCOSE 101* 104*  BUN 8 11  CREATININE 0.64 0.74  CALCIUM 8.4* 8.6*  MG  --  1.7   GFR: Estimated Creatinine Clearance: 130.1 mL/min (by C-G formula based on SCr of 0.74 mg/dL). Liver Function Tests:  Recent Labs Lab 01/16/17 0214  AST 29  ALT 19  ALKPHOS 92    BILITOT 0.4  PROT 6.9  ALBUMIN 2.9*   No results for input(s): LIPASE, AMYLASE in the last 168 hours. No results for input(s): AMMONIA in the last 168 hours. Coagulation Profile: No results for input(s): INR, PROTIME in the last 168 hours. Cardiac Enzymes: No results for input(s): CKTOTAL, CKMB, CKMBINDEX, TROPONINI in the last 168 hours. BNP (last 3 results) No results for input(s): PROBNP in the last 8760 hours. HbA1C: No results for input(s): HGBA1C in the last 72 hours. CBG: No results for input(s): GLUCAP in the last 168 hours. Lipid Profile: No results for input(s): CHOL, HDL, LDLCALC, TRIG, CHOLHDL, LDLDIRECT in the last 72 hours. Thyroid Function Tests: No results for input(s): TSH, T4TOTAL, FREET4, T3FREE, THYROIDAB in the last 72 hours. Anemia Panel: No results for input(s): VITAMINB12, FOLATE, FERRITIN, TIBC, IRON, RETICCTPCT in the last 72 hours. Sepsis Labs: No results for input(s): PROCALCITON, LATICACIDVEN in the last 168 hours.  Recent Results (from the past 240 hour(s))  MRSA PCR Screening     Status: None   Collection Time: 01/10/17  5:57 PM  Result Value Ref Range Status   MRSA by PCR NEGATIVE NEGATIVE Final    Comment:        The GeneXpert MRSA Assay (FDA approved for NASAL specimens only), is one component of a comprehensive MRSA colonization surveillance program. It is not intended to diagnose MRSA infection nor to guide or monitor treatment for MRSA infections.          Radiology Studies: No results found.      Scheduled Meds: . acetaminophen  650 mg Oral BID  . apixaban  5 mg Oral Q12H  . cholecalciferol  1,000 Units Oral Daily  . diltiazem  240 mg Oral Daily  . lacosamide  100 mg Oral BID  . levETIRAcetam  1,500 mg Oral BID  . Melatonin  3 mg Oral QHS  . metoprolol tartrate  25 mg Oral BID  . QUEtiapine  50 mg Oral Daily  . sertraline  100 mg Oral Daily  . zolpidem  10 mg Oral QHS   Continuous Infusions:   LOS: 8 days      Jacquelin Hawking, MD [facility] 01/19/2017, 2:50 PM Pager: [phone]  If 7PM-7AM, please contact night-coverage [email or web address] Password [email or web address] 01/19/2017, 2:50 PM

## 2017-01-20 ENCOUNTER — Other Ambulatory Visit: Payer: Self-pay

## 2017-01-20 DIAGNOSIS — G894 Chronic pain syndrome: Secondary | ICD-10-CM

## 2017-01-20 DIAGNOSIS — K769 Liver disease, unspecified: Secondary | ICD-10-CM

## 2017-01-20 DIAGNOSIS — F0391 Unspecified dementia with behavioral disturbance: Secondary | ICD-10-CM

## 2017-01-20 MED ORDER — DICLOFENAC SODIUM 1 % TD GEL
4.0000 g | Freq: Four times a day (QID) | TRANSDERMAL | Status: DC
  Administered 2017-01-20 (×2): 4 g via TOPICAL
  Filled 2017-01-20: qty 100

## 2017-01-20 MED ORDER — LACOSAMIDE 100 MG PO TABS: 100.0000 mg | ORAL_TABLET | Freq: Two times a day (BID) | ORAL | 0 refills | Status: DC

## 2017-01-20 MED ORDER — ZOLPIDEM TARTRATE 10 MG PO TABS: 10.0000 mg | ORAL_TABLET | Freq: Every day | ORAL | 0 refills | Status: DC

## 2017-01-20 MED ORDER — DICLOFENAC SODIUM 1 % TD GEL: 4.0000 g | Freq: Four times a day (QID) | TRANSDERMAL | 2 refills | Status: DC

## 2017-01-20 MED ORDER — DICLOFENAC SODIUM 1 % TD GEL: 4.0000 g | Freq: Four times a day (QID) | TRANSDERMAL | Status: DC

## 2017-01-20 MED ORDER — SUMATRIPTAN SUCCINATE 50 MG PO TABS
50.0000 mg | ORAL_TABLET | Freq: Once | ORAL | Status: AC
  Administered 2017-01-20: 50 mg via ORAL
  Filled 2017-01-20: qty 1

## 2017-01-20 NOTE — Clinical Social Work Note (Addendum)
Patient's PASARR is under level 2 review.  Charlynn CourtSarah Greyson Peavy, CSW [phone]  11:09 am CSW received call from patient's [facility]. [facility] CSW explained that he can discharge to the facility once PASARR is obtained. Patient will need PTAR. Hospital liaison for the facility notified.  Charlynn CourtSarah Charna Neeb, CSW [phone]  12:30 pm PASARR obtained: [id number] [phone]. SNF can take patient today. CSW paged MD to notify.  Charlynn CourtSarah Lassie Demorest, CSW [phone]

## 2017-01-20 NOTE — Progress Notes (Signed)
Pt discharged [facility] via [facility] in stable condition. Pt declined to listen to discharge instructions stating 'i will read that myself'. Report called and given to receiving facility nurse. AVS and scripts given to ptar crew

## 2017-01-20 NOTE — Clinical Social Work Placement (Signed)
   CLINICAL SOCIAL WORK PLACEMENT  NOTE  Date:  01/20/2017  Patient Details  Name: [name] AlexanderFennell Rodkey MRN: [id number] Date of Birth: Oct 23, 1960  Clinical Social Work is seeking post-discharge placement for this patient at the Skilled  Nursing Facility level of care (*CSW will initial, date and re-position this form in  chart as items are completed):  Yes   Patient/family provided with [facility] Clinical Social Work Department's list of facilities offering this level of care within the geographic area requested by the patient (or if unable, by the patient's family).  Yes   Patient/family informed of their freedom to choose among providers that offer the needed level of care, that participate in Medicare, Medicaid or managed care program needed by the patient, have an available bed and are willing to accept the patient.  Yes   Patient/family informed of [facility]'s ownership interest in [facility] and [facility], as well as of the fact that they are under no obligation to receive care at these facilities.  PASRR submitted to EDS on 01/19/17     PASRR number received on 01/20/17     Existing PASRR number confirmed on       FL2 transmitted to all facilities in geographic area requested by pt/family on 01/14/17     FL2 transmitted to all facilities within larger geographic area on       Patient informed that his/her managed care company has contracts with or will negotiate with certain facilities, including the following:        Yes   Patient/family informed of bed offers received.  [facility]     Physician recommends and patient chooses bed at      [facility].  Patient to be transferred to facility by PTAR     Patient family notified on 01/20/17 of transfer.  Name of family member notified:  [name]     PHYSICIAN Please prepare prescriptions     Additional  Comment:    _______________________________________________ Margarito LinerSarah C Tannis Burstein, LCSW 01/20/2017, 2:20 PM

## 2017-01-20 NOTE — Discharge Summary (Signed)
Physician Discharge Summary  [name] [name] ZOX:[id number] DOB: [birth year] DOA: 01/10/2017  PCP: Mortimer Fries, PA  Admit date: 01/10/2017 Discharge date: 01/20/2017  Admitted From: [name] Disposition: SNF  Recommendations for Outpatient Follow-up:  1. Follow up with PCP in 1 week 2. Assess for occult bleeding in pt newly taking Eliquis 3. Assure compliance w/ seizure meds 4. Assess for rate control w/ new diagnosis of Aflutter  5. Follow up with cardiology in 4 weeks for atrial flutter 6. Please follow up on the following pending results: None  Home Health: SNF Equipment/Devices: SNF  Discharge Condition: Stable CODE STATUS: Full code Diet recommendation: Heart healthy   Brief/Interim Summary:  Admission HPI written by Haydee Monica, MD   Chief Complaint:   Passed out  HPI: [name] [name] is a 55 y.o. male with medical history significant of dementia, seizure disorder, hep c, htn comes in with syncopal episode at his group home  Pt reports he "just fell out".  He is a very poor historian.  Denies any fevers.  No recent illnesses.  Earlier he was having a lot of chest pressure and his heart rate was 200 when ems arrived.  He was given adenosine twice with no results.  In ED given cardizem bolus and drip and rate dropped to 110 range and was actually aflutter which is new for pt.  Pt feels much better now that rate is down and his chest pressure is resolved.     Hospital course:  Atrial flutter with RVR Resolved. DCCV attempted and failed. Cardiology recommended Eliquis, repeat attempt for DCCV in 4 weeks -continue diltiazem; metoprolol  Chest pain Atypical. Reproducible. Troponin negative. Voltaren gel.  Ethanol abuse Counseled.  Seizure disorder Continued lacosamide and Keppra  Dementia Bipolar disorder Patient does not have capacity per psychiatry evaluation. Continued Seroquel and Zoloft  Aneurysm S/p repair in 2006.  Migraine headache Improved with  Imitrex.   Chronic hepatitis C  Insomnia Continued melatonin and Ambien.   Discharge Diagnoses:  Principal Problem:   Atrial flutter with rapid ventricular response (HCC) Active Problems:   Chronic liver disease   Chronic hepatitis C without hepatic coma (HCC)   Convulsions/seizures (HCC)   S/P cerebral aneurysm repair   Dementia with behavioral disturbance   Chronic pain syndrome   Syncope    Discharge Instructions  Discharge Instructions    Diet - low sodium heart healthy    Complete by:  As directed    Increase activity slowly    Complete by:  As directed    Increase activity slowly    Complete by:  As directed      Allergies as of 01/20/2017      Reactions   Carbamazepine Other (See Comments)   Hyponatremia   Depakote [divalproex Sodium] Other (See Comments)   Unknown - on MAR      Medication List    STOP taking these medications   cephALEXin 500 MG capsule Commonly known as:  KEFLEX     TAKE these medications   acetaminophen 325 MG tablet Commonly known as:  TYLENOL Take 2 tablets (650 mg total) by mouth every 6 (six) hours as needed for mild pain or headache. What changed:  medication strength  how much to take  when to take this  reasons to take this   apixaban 5 MG Tabs tablet Commonly known as:  ELIQUIS Take 1 tablet (5 mg total) by mouth every 12 (twelve) hours.   diclofenac sodium 1 % Gel Commonly known as:  VOLTAREN Apply 4 g topically 4 (four) times daily. To chest. Discontinue when pain resolved. What changed:  additional instructions   diltiazem 180 MG 24 hr capsule Commonly known as:  CARDIZEM CD Take 1 capsule (180 mg total) by mouth daily.   Lacosamide 100 MG Tabs Commonly known as:  VIMPAT Take 1 tablet (100 mg total) by mouth 2 (two) times daily. What changed:  when to take this   levETIRAcetam 750 MG tablet Commonly known as:  KEPPRA Take 2 tablets (1,500 mg total) by mouth in the morning and in the evening  daily.   Melatonin 3 MG Tabs Take 3 mg by mouth at bedtime.   metoprolol tartrate 25 MG tablet Commonly known as:  LOPRESSOR TAKE 1 TABLET BY MOUTH TWICE DAILY What changed:  See the new instructions.   QUEtiapine 50 MG tablet Commonly known as:  SEROQUEL Take 50 mg by mouth daily.   sertraline 100 MG tablet Commonly known as:  ZOLOFT Take 100 mg by mouth daily.   Vitamin D3 1000 units Caps Take 1,000 Units by mouth daily.   zolpidem 10 MG tablet Commonly known as:  AMBIEN Take 10 mg by mouth at bedtime.       Contact information for follow-up providers    Duke Salvia, MD Follow up on 02/16/2017.   Specialty:  Cardiology Why:  1:30PM Contact information: [address] [place] [address] [phone]        Mortimer Fries, [place] Follow up in 1 week(s).   Specialty:  Internal Medicine Contact information: [address] [place] [address] [phone]        York Spaniel, MD Follow up in 1 week(s).   Specialty:  Neurology Contact information: [address] [place] [address] [phone]            Contact information for after-discharge care    Destination    [facility] .   Specialty:  Skilled Nursing Facility Contact information: [address] [place] [address] [phone]                 Allergies  Allergen Reactions  . Carbamazepine Other (See Comments)    Hyponatremia  . Depakote [Divalproex Sodium] Other (See Comments)    Unknown - on [facility]    Consultations:  Cardiology  Electrophysiology  Psychiatry   Procedures/Studies: Ct Head Wo Contrast  Result Date: 01/10/2017 CLINICAL DATA:  Pain following fall EXAM: CT HEAD WITHOUT CONTRAST CT CERVICAL SPINE WITHOUT CONTRAST TECHNIQUE: Multidetector CT imaging of the head and cervical spine was performed following the standard protocol without intravenous contrast. Multiplanar  CT image reconstructions of the cervical spine were also generated. COMPARISON:  Brain MRI November 03, 2014; head CT and cervical spine CT October 31, 2014 FINDINGS: CT HEAD FINDINGS Brain: Moderate diffuse atrophy is stable. There is no intracranial mass, hemorrhage, extra-axial fluid collection, or midline shift. 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 Elsewhere there is patchy small vessel disease in the centra semiovale bilaterally. No new gray-white compartment lesions evident. No evidence of acute infarct. Air is noted to the right of the sella, chronic and stable from the 2016 study. Vascular: There is no hyperdense vessel. There is calcification in the carotid siphon regions. Skull: Bony calvarium appears intact. Sinuses/Orbits: Old nasal fractures appear stable. There is evidence of old trauma involving the medial  left orbital wall, stable. There is mucosal thickening in both maxillary antra as well as in multiple ethmoid air cells. Other visualized paranasal sinuses are clear. No intraorbital lesions are evident. Other: Mastoid air cells are clear. CT CERVICAL SPINE FINDINGS Alignment: There is no appreciable spondylolisthesis. Skull base and vertebrae: Skull base and craniocervical junction regions appear normal. There is no acute fracture. No blastic or lytic bone lesions evident. Soft tissues and spinal canal: Prevertebral soft tissues and predental space regions are normal. There are no paraspinous lesions. There is no evident cord or canal hematoma. Disc levels: There is moderate disc space narrowing at C4-5. There is somewhat more severe disc space narrowing at C5-6, C6-7, and C7-T1. There are prominent anterior osteophytes at C4, C5, C6, and to a slightly lesser extent at C7. There is facet hypertrophy at multiple levels. There is exit foraminal narrowing due to bony hypertrophy on the left at C3-4, at C4-5 bilaterally, and at C6-7 bilaterally. No frank  disc extrusion or stenosis. Upper chest: Visualized upper lung zones are clear. Other: There are foci of calcification in both subclavian arteries as well as in both carotid arteries. IMPRESSION: CT head: Stable moderate diffuse atrophy, slightly more pronounced involving the cerebellum than elsewhere. 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 No acute infarct. There is patchy periventricular small vessel disease. No mass, hemorrhage, or extra-axial fluid collection. Areas present to the right of the sella, chronic and stable. There appears to be a connection between the posterior sphenoid sinus in this area, possibly residua of old trauma. There are areas of prior nasal trauma and trauma involving the medial left orbital wall. There are foci of paranasal sinus disease currently. There are foci of arterial vascular calcification. CT cervical spine: No fracture or spondylolisthesis. Multilevel osteoarthritic change present. There are foci of calcification in both carotid and subclavian arteries. Electronically Signed   By: Bretta Bang III M.D.   On: 01/10/2017 10:54   Ct Cervical Spine Wo Contrast  Result Date: 01/10/2017 CLINICAL DATA:  Pain following fall EXAM: CT HEAD WITHOUT CONTRAST CT CERVICAL SPINE WITHOUT CONTRAST TECHNIQUE: Multidetector CT imaging of the head and cervical spine was performed following the standard protocol without intravenous contrast. Multiplanar CT image reconstructions of the cervical spine were also generated. COMPARISON:  Brain MRI November 03, 2014; head CT and cervical spine CT October 31, 2014 FINDINGS: CT HEAD FINDINGS Brain: Moderate diffuse atrophy is stable. There is no intracranial mass, hemorrhage, extra-axial fluid collection, or midline shift. 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 Elsewhere there is patchy small vessel disease in the centra semiovale bilaterally. No new gray-white  compartment lesions evident. No evidence of acute infarct. Air is noted to the right of the sella, chronic and stable from the 2016 study. Vascular: There is no hyperdense vessel. There is calcification in the carotid siphon regions. Skull: Bony calvarium appears intact. Sinuses/Orbits: Old nasal fractures appear stable. There is evidence of old trauma involving the medial left orbital wall, stable. There is mucosal thickening in both maxillary antra as well as in multiple ethmoid air cells. Other visualized paranasal sinuses are clear. No intraorbital lesions are evident. Other: Mastoid air cells are clear. CT CERVICAL SPINE FINDINGS Alignment: There is no appreciable spondylolisthesis. Skull base and vertebrae: Skull base and craniocervical junction regions appear normal. There is no acute fracture. No blastic or lytic bone lesions evident. Soft tissues and  spinal canal: Prevertebral soft tissues and predental space regions are normal. There are no paraspinous lesions. There is no evident cord or canal hematoma. Disc levels: There is moderate disc space narrowing at C4-5. There is somewhat more severe disc space narrowing at C5-6, C6-7, and C7-T1. There are prominent anterior osteophytes at C4, C5, C6, and to a slightly lesser extent at C7. There is facet hypertrophy at multiple levels. There is exit foraminal narrowing due to bony hypertrophy on the left at C3-4, at C4-5 bilaterally, and at C6-7 bilaterally. No frank disc extrusion or stenosis. Upper chest: Visualized upper lung zones are clear. Other: There are foci of calcification in both subclavian arteries as well as in both carotid arteries. IMPRESSION: CT head: Stable moderate diffuse atrophy, slightly more pronounced involving the cerebellum than elsewhere. 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 No acute infarct. There is patchy periventricular small vessel disease. No mass, hemorrhage, or extra-axial fluid collection.  Areas present to the right of the sella, chronic and stable. There appears to be a connection between the posterior sphenoid sinus in this area, possibly residua of old trauma. There are areas of prior nasal trauma and trauma involving the medial left orbital wall. There are foci of paranasal sinus disease currently. There are foci of arterial vascular calcification. CT cervical spine: No fracture or spondylolisthesis. Multilevel osteoarthritic change present. There are foci of calcification in both carotid and subclavian arteries. Electronically Signed   By: Bretta BangWilliam  Woodruff III M.D.   On: 01/10/2017 10:54   Dg Chest Portable 1 View  Result Date: 01/10/2017 CLINICAL DATA:  Pain following fall.  Supraventricular tachycardia EXAM: PORTABLE CHEST 1 VIEW COMPARISON:  November 04, 2014 FINDINGS: There is no edema or consolidation. Heart is mildly enlarged with pulmonary vascularity within normal limits. No adenopathy. There is aortic atherosclerosis. No pneumothorax. There is evidence of old rib trauma on the right, unchanged. IMPRESSION: Stable cardiac prominence. Aortic atherosclerosis. No edema or consolidation. Aortic Atherosclerosis (ICD10-I70.0). Electronically Signed   By: Bretta BangWilliam  Woodruff III M.D.   On: 01/10/2017 09:16     Echocardiogram (01/11/2017)  Study Conclusions  - Left ventricle: The cavity size was normal. Wall thickness was   increased in a pattern of moderate LVH. Systolic function was   normal. The estimated ejection fraction was in the range of 55%   to 60%. Wall motion was normal; there were no regional wall   motion abnormalities. The study is not technically sufficient to   allow evaluation of LV diastolic function. - Mitral valve: There was mild regurgitation. - Right ventricle: The cavity size was mildly dilated. - Right atrium: The atrium was mildly dilated.  Impressions:  - Normal LV function; ;moderate LVH: mildly dilated aortic root   (4.3 cm); mild MR; mild  RAE and RVE.   Subjective: Headache and right sided chest pain.  Discharge Exam: Vitals:   01/20/17 0545 01/20/17 0930  BP: 117/65 126/83  Pulse: 80 (!) 103  Resp: 18 18  Temp: 98.4 F (36.9 C) 97.8 F (36.6 C)  SpO2: 96% 96%   Vitals:   01/19/17 1405 01/19/17 2248 01/20/17 0545 01/20/17 0930  BP: 116/64 (!) 136/94 117/65 126/83  Pulse: 100 86 80 (!) 103  Resp: 18 18 18 18   Temp: 97.6 F (36.4 C) 98.5 F (36.9 C) 98.4 F (36.9 C) 97.8 F (36.6 C)  TempSrc: Oral Oral Oral Oral  SpO2: 97% 96% 96% 96%  Weight:  Height:        General: Pt is alert, awake, not in acute distress Cardiovascular: RRR, S1/S2 +, no rubs, no gallops Respiratory: CTA bilaterally, no wheezing, no rhonchi Abdominal: Soft, NT, ND, bowel sounds + Extremities: no edema, no cyanosis    The results of significant diagnostics from this hospitalization (including imaging, microbiology, ancillary and laboratory) are listed below for reference.     Microbiology: Recent Results (from the past 240 hour(s))  MRSA PCR Screening     Status: None   Collection Time: 01/10/17  5:57 PM  Result Value Ref Range Status   MRSA by PCR NEGATIVE NEGATIVE Final    Comment:        The GeneXpert MRSA Assay (FDA approved for NASAL specimens only), is one component of a comprehensive MRSA colonization surveillance program. It is not intended to diagnose MRSA infection nor to guide or monitor treatment for MRSA infections.      Labs: BNP (last 3 results) No results for input(s): BNP in the last 8760 hours. Basic Metabolic Panel:  Recent Labs Lab 01/15/17 0135 01/16/17 0214  NA 137 138  K 3.6 4.0  CL 105 107  CO2 23 23  GLUCOSE 101* 104*  BUN 8 11  CREATININE 0.64 0.74  CALCIUM 8.4* 8.6*  MG  --  1.7   Liver Function Tests:  Recent Labs Lab 01/16/17 0214  AST 29  ALT 19  ALKPHOS 92  BILITOT 0.4  PROT 6.9  ALBUMIN 2.9*   No results for input(s): LIPASE, AMYLASE in the last 168  hours. No results for input(s): AMMONIA in the last 168 hours. CBC:  Recent Labs Lab 01/14/17 0303 01/15/17 0135 01/16/17 0214 01/17/17 0207 01/18/17 0325  WBC 7.5 7.2 7.8 7.5 8.1  HGB 13.2 13.5 13.5 13.6 13.8  HCT 38.4* 37.8* 39.3 39.6 39.6  MCV 97.0 96.2 97.5 97.5 98.3  PLT 236 223 218 219 208   Cardiac Enzymes: No results for input(s): CKTOTAL, CKMB, CKMBINDEX, TROPONINI in the last 168 hours. BNP: Invalid input(s): POCBNP CBG: No results for input(s): GLUCAP in the last 168 hours. D-Dimer No results for input(s): DDIMER in the last 72 hours. Hgb A1c No results for input(s): HGBA1C in the last 72 hours. Lipid Profile No results for input(s): CHOL, HDL, LDLCALC, TRIG, CHOLHDL, LDLDIRECT in the last 72 hours. Thyroid function studies No results for input(s): TSH, T4TOTAL, T3FREE, THYROIDAB in the last 72 hours.  Invalid input(s): FREET3 Anemia work up No results for input(s): VITAMINB12, FOLATE, FERRITIN, TIBC, IRON, RETICCTPCT in the last 72 hours. Urinalysis    Component Value Date/Time   COLORURINE YELLOW 10/31/2014 1840   APPEARANCEUR CLEAR 10/31/2014 1840   LABSPEC 1.007 10/31/2014 1840   PHURINE 7.0 10/31/2014 1840   GLUCOSEU NEGATIVE 10/31/2014 1840   HGBUR NEGATIVE 10/31/2014 1840   BILIRUBINUR Small 10/19/2015 1526   KETONESUR NEGATIVE 10/31/2014 1840   PROTEINUR Negative 10/19/2015 1526   PROTEINUR NEGATIVE 10/31/2014 1840   UROBILINOGEN >=8.0 10/19/2015 1526   UROBILINOGEN 0.2 10/31/2014 1840   NITRITE Negative 10/19/2015 1526   NITRITE NEGATIVE 10/31/2014 1840   LEUKOCYTESUR Negative 10/19/2015 1526   Sepsis Labs Invalid input(s): PROCALCITONIN,  WBC,  LACTICIDVEN Microbiology Recent Results (from the past 240 hour(s))  MRSA PCR Screening     Status: None   Collection Time: 01/10/17  5:57 PM  Result Value Ref Range Status   MRSA by PCR NEGATIVE NEGATIVE Final    Comment:        The  GeneXpert MRSA Assay (FDA approved for NASAL  specimens only), is one component of a comprehensive MRSA colonization surveillance program. It is not intended to diagnose MRSA infection nor to guide or monitor treatment for MRSA infections.      Time coordinating discharge: Over 30 minutes  SIGNED:   Jacquelin Hawking, MD [facility] 01/20/2017, 2:16 PM Pager [phone]  If 7PM-7AM, please contact night-coverage [email or web address] Password [email or web address]

## 2017-01-20 NOTE — Progress Notes (Signed)
PROGRESS NOTE    [name] AlexanderFennell Hartgrove  UJW:[id number] DOB: 02-12-1961 DOA: 01/10/2017 PCP: Mortimer Friesurl, [place], PA   Brief Narrative: [name] [name] is a 56 y.o. with a history of Bipolar, EtOH abuse, Dementia, Seizure disorder, Hep c, HTN, Hereditary and idiopathic peripheral neuropathy, aneurysm [facility]  Comes in with syncopal episode at his group home Pt reports he "just fell out". He is a very poor historian. Denies any fevers. No recent illnesses. Earlier he was having a lot of chest pressure and his heart rate was 200 when ems arrived. He was given adenosine twice with no results. In ED given cardizem bolus and drip and rate dropped to 110 range and was actually aflutter which is new for pt. Pt feels much better now that rate is down and his chest pressure is improved   Assessment & Plan:   Principal Problem:   Atrial flutter with rapid ventricular response (HCC) Active Problems:   Chronic liver disease   Chronic hepatitis C without hepatic coma (HCC)   Convulsions/seizures (HCC)   S/P cerebral aneurysm repair   Dementia with behavioral disturbance   Chronic pain syndrome   Syncope   Atrial flutter with RVR Resolved. DCCV attempted and failed. -cardiology recommendations: Eliquis, repeat attempt for DCCV in 4 weeks -continue diltiazem; metoprolol  Chest pain Unstable angina Atypical. Reproducible. -Voltaren gel  Ethanol abuse Counseled.  Seizure disorder -continue lacosamide and Keppra  Dementia Bipolar disorder Patient does not have capacity  Aneurysm S/p repair in 2006.  Migraine headache Persistent. S/p Imitrex subq -Imitrex 50mg  once  Chronic hepatitis C   DVT prophylaxis: Eliquis Code Status: Full code Family Communication: None at bedside Disposition Plan: Discharge to SNF. Medically stable for discharge.   Consultants:   Cardiology  Procedures:   DCCV (attempted)  Antimicrobials:  None    Subjective: Chest  pain and headache persisting.  Objective: Vitals:   01/19/17 1405 01/19/17 2248 01/20/17 0545 01/20/17 0930  BP: 116/64 (!) 136/94 117/65 126/83  Pulse: 100 86 80 (!) 103  Resp: 18 18 18 18   Temp: 97.6 F (36.4 C) 98.5 F (36.9 C) 98.4 F (36.9 C) 97.8 F (36.6 C)  TempSrc: Oral Oral Oral Oral  SpO2: 97% 96% 96% 96%  Weight:      Height:        Intake/Output Summary (Last 24 hours) at 01/20/17 1129 Last data filed at 01/20/17 0939  Gross per 24 hour  Intake              660 ml  Output             1725 ml  Net            -1065 ml   Filed Weights   01/10/17 1750  Weight: 105.4 kg (232 lb 5.8 oz)    Examination:  General exam: Appears calm and comfortable Respiratory system: Clear to auscultation. Respiratory effort normal. Cardiovascular system: S1 & S2 heard, RRR. No murmurs, rubs, gallops or clicks. Gastrointestinal system: Abdomen is nondistended, soft and nontender. No organomegaly or masses felt. Normal bowel sounds heard. Central nervous system: Alert and oriented. No focal neurological deficits. Extremities: No edema. No calf tenderness Skin: No cyanosis. No rashes Psychiatry: Judgement and insight appear impaired. Mood & affect normal and flat.     Data Reviewed: I have personally reviewed following labs and imaging studies  CBC:  Recent Labs Lab 01/14/17 0303 01/15/17 0135 01/16/17 0214 01/17/17 0207 01/18/17 0325  WBC 7.5 7.2  7.8 7.5 8.1  HGB 13.2 13.5 13.5 13.6 13.8  HCT 38.4* 37.8* 39.3 39.6 39.6  MCV 97.0 96.2 97.5 97.5 98.3  PLT 236 223 218 219 208   Basic Metabolic Panel:  Recent Labs Lab 01/15/17 0135 01/16/17 0214  NA 137 138  K 3.6 4.0  CL 105 107  CO2 23 23  GLUCOSE 101* 104*  BUN 8 11  CREATININE 0.64 0.74  CALCIUM 8.4* 8.6*  MG  --  1.7   GFR: Estimated Creatinine Clearance: 130.1 mL/min (by C-G formula based on SCr of 0.74 mg/dL). Liver Function Tests:  Recent Labs Lab 01/16/17 0214  AST 29  ALT 19  ALKPHOS 92    BILITOT 0.4  PROT 6.9  ALBUMIN 2.9*   No results for input(s): LIPASE, AMYLASE in the last 168 hours. No results for input(s): AMMONIA in the last 168 hours. Coagulation Profile: No results for input(s): INR, PROTIME in the last 168 hours. Cardiac Enzymes: No results for input(s): CKTOTAL, CKMB, CKMBINDEX, TROPONINI in the last 168 hours. BNP (last 3 results) No results for input(s): PROBNP in the last 8760 hours. HbA1C: No results for input(s): HGBA1C in the last 72 hours. CBG: No results for input(s): GLUCAP in the last 168 hours. Lipid Profile: No results for input(s): CHOL, HDL, LDLCALC, TRIG, CHOLHDL, LDLDIRECT in the last 72 hours. Thyroid Function Tests: No results for input(s): TSH, T4TOTAL, FREET4, T3FREE, THYROIDAB in the last 72 hours. Anemia Panel: No results for input(s): VITAMINB12, FOLATE, FERRITIN, TIBC, IRON, RETICCTPCT in the last 72 hours. Sepsis Labs: No results for input(s): PROCALCITON, LATICACIDVEN in the last 168 hours.  Recent Results (from the past 240 hour(s))  MRSA PCR Screening     Status: None   Collection Time: 01/10/17  5:57 PM  Result Value Ref Range Status   MRSA by PCR NEGATIVE NEGATIVE Final    Comment:        The GeneXpert MRSA Assay (FDA approved for NASAL specimens only), is one component of a comprehensive MRSA colonization surveillance program. It is not intended to diagnose MRSA infection nor to guide or monitor treatment for MRSA infections.          Radiology Studies: No results found.      Scheduled Meds: . acetaminophen  650 mg Oral BID  . apixaban  5 mg Oral Q12H  . cholecalciferol  1,000 Units Oral Daily  . diclofenac sodium  4 g Topical QID  . diltiazem  240 mg Oral Daily  . lacosamide  100 mg Oral BID  . levETIRAcetam  1,500 mg Oral BID  . Melatonin  3 mg Oral QHS  . metoprolol tartrate  25 mg Oral BID  . QUEtiapine  50 mg Oral Daily  . sertraline  100 mg Oral Daily  . zolpidem  10 mg Oral QHS    Continuous Infusions:   LOS: 9 days     Jacquelin Hawking, MD [facility] 01/20/2017, 11:29 AM Pager: [phone]  If 7PM-7AM, please contact night-coverage [email or web address] Password [facility] 01/20/2017, 11:29 AM

## 2017-01-20 NOTE — Telephone Encounter (Signed)
RX faxed to [facility] @ [phone], phone number [phone] 

## 2017-01-20 NOTE — Clinical Social Work Note (Signed)
CSW facilitated patient discharge including contacting patient family and facility to confirm patient discharge plans. Clinical information faxed to facility and family agreeable with plan. CSW arranged ambulance transport via [facility] to [facility]. RN to call report prior to discharge [phone]). Please call patient's son when transport arrives Hoy Finlay(Damero Fox: [phone])  CSW will sign off for now as social work intervention is no longer needed. Please consult us again if new needs arise.  Charlynn CourtSarah Cabell Lazenby, CSW [phone]

## 2017-01-21 ENCOUNTER — Non-Acute Institutional Stay (SKILLED_NURSING_FACILITY): Payer: Medicaid Other | Admitting: Adult Health

## 2017-01-21 ENCOUNTER — Encounter: Payer: Self-pay | Admitting: Adult Health

## 2017-01-21 ENCOUNTER — Other Ambulatory Visit: Payer: Self-pay

## 2017-01-21 DIAGNOSIS — F0151 Vascular dementia with behavioral disturbance: Secondary | ICD-10-CM

## 2017-01-21 DIAGNOSIS — F3162 Bipolar disorder, current episode mixed, moderate: Secondary | ICD-10-CM | POA: Diagnosis not present

## 2017-01-21 DIAGNOSIS — F01518 Vascular dementia, unspecified severity, with other behavioral disturbance: Secondary | ICD-10-CM

## 2017-01-21 DIAGNOSIS — B182 Chronic viral hepatitis C: Secondary | ICD-10-CM | POA: Diagnosis not present

## 2017-01-21 DIAGNOSIS — G43009 Migraine without aura, not intractable, without status migrainosus: Secondary | ICD-10-CM | POA: Diagnosis not present

## 2017-01-21 DIAGNOSIS — R569 Unspecified convulsions: Secondary | ICD-10-CM

## 2017-01-21 DIAGNOSIS — I10 Essential (primary) hypertension: Secondary | ICD-10-CM

## 2017-01-21 DIAGNOSIS — G894 Chronic pain syndrome: Secondary | ICD-10-CM

## 2017-01-21 DIAGNOSIS — I6930 Unspecified sequelae of cerebral infarction: Secondary | ICD-10-CM

## 2017-01-21 DIAGNOSIS — I4892 Unspecified atrial flutter: Secondary | ICD-10-CM

## 2017-01-21 DIAGNOSIS — G43909 Migraine, unspecified, not intractable, without status migrainosus: Secondary | ICD-10-CM | POA: Insufficient documentation

## 2017-01-21 DIAGNOSIS — F319 Bipolar disorder, unspecified: Secondary | ICD-10-CM | POA: Insufficient documentation

## 2017-01-21 MED ORDER — TRAMADOL HCL 50 MG PO TABS: 50.0000 mg | ORAL_TABLET | Freq: Four times a day (QID) | ORAL | 0 refills | Status: DC | PRN

## 2017-01-21 NOTE — Telephone Encounter (Signed)
RX faxed to [facility] @ [phone], phone number [phone] 

## 2017-01-21 NOTE — Progress Notes (Signed)
Location:   [facility] Room Number: 102 B Place of Service:  SNF (31)   CODE STATUS: Full Code  Allergies  Allergen Reactions  . Carbamazepine Other (See Comments)    Hyponatremia  . Depakote [Divalproex Sodium] Other (See Comments)    Unknown - on [facility]    Chief Complaint  Patient presents with  . Hospitalization Follow-up    Hospital follow up    HPI:  He is a 56 year old man who had been living in [name] has been hospitalized  from 01-10-17 01-20-17. He had had been admitted to the hospitalized for atrial flutter with rvr with a failed dccv; was started on eliquis. He had chest pain non-cardiac will treat voltaren gel. Has seizure disorder will continued to be treated with keppra and vampit. He has dementia and bipolar disorder he does not have capacity per psychiatry evaluation.  He tells that this will be a long term placement for him. He is complaining of chest pain and a headache.    Past Medical History:  Diagnosis Date  . Aneurysm (HCC)   . ETOHism (HCC)   . Gait abnormality 05/26/2016  . Hepatitis C   . 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 Hypertension   . Seizure [facility])     Past Surgical History:  Procedure Laterality Date  . CEREBRAL ANEURYSM REPAIR     At [facility]  . HARDWARE REMOVAL Left 10/29/2012   Procedure: HARDWARE REMOVAL;  Surgeon: Sheral Apley, MD;  Location: [facility] OR;  Service: Orthopedics;  Laterality: Left;  . I&D EXTREMITY Left 10/26/2012   Procedure: IRRIGATION AND DEBRIDEMENT Left Elbow;  Surgeon: Sheral Apley, MD;  Location: [facility] OR;  Service: Orthopedics;  Laterality: Left;  . I&D EXTREMITY Left 10/29/2012   Procedure: IRRIGATION AND DEBRIDEMENT EXTREMITY, wound vac change, stimulan beads;  Surgeon: Sheral Apley, MD;  Location: [facility] OR;  Service: Orthopedics;  Laterality: Left;  lateral on bean bag  . I&D EXTREMITY Left 11/02/2012   Procedure: IRRIGATION AND DEBRIDEMENT EXTREMITY;  Surgeon: Sheral Apley, MD;  Location: [facility] OR;  Service: Orthopedics;  Laterality: Left;  . INCISION AND DRAINAGE ABSCESS Left 11/02/2012   Procedure: INCISION AND DRAINAGE ABSCESS;  Surgeon: Sheral Apley, MD;  Location: [facility] OR;  Service: Orthopedics;  Laterality: Left;    Social History   Social History  . Marital status: Single    Spouse name: N/A  . Number of children: 3  . Years of education: 84 th   Occupational History  . disabled    Social History Main Topics  . Smoking status: Former Smoker    Packs/day: 0.25    Types: Cigarettes  . Smokeless tobacco: Never Used  . Alcohol use No     Comment: quit drinking 11/2011 per patient  . Drug use: No  . Sexual activity: Not Currently   Other Topics Concern  . Not on file   Social History Narrative   Patient is single and lives at home alone.   Disabled.   Education 11 th grade.   Right handed.   Caffeine none .   Family History  Problem Relation Age of Onset  . Hypertension Mother   . Cancer Mother   . Hypertension Father   . Diabetes Sister   . Cancer Sister   . Cancer Sister       VITAL SIGNS BP (!) 143/78   Pulse 78   Temp 98.2 F (36.8 C)  Resp 18   Ht 6\' 6"  (1.981 m)   Wt 229 lb 3.2 oz (104 kg)   SpO2 97%   BMI 26.49 kg/m   Patient's Medications  New Prescriptions   No medications on file  Previous Medications   ACETAMINOPHEN (TYLENOL) 325 MG TABLET    Take 2 tablets (650 mg total) by mouth every 6 (six) hours as needed for mild pain or headache.   APIXABAN (ELIQUIS) 5 MG TABS TABLET    Take 1 tablet (5 mg total) by mouth every 12 (twelve) hours.   CHOLECALCIFEROL (VITAMIN D3) 1000 UNITS CAPS    Take 1,000 Units by mouth daily.   DICLOFENAC SODIUM (VOLTAREN) 1 % GEL    Apply 4 g topically 4 (four) times daily. To chest. Discontinue when pain resolved.   DILTIAZEM (CARDIZEM CD) 180 MG 24 HR CAPSULE    Take 1 capsule (180 mg total) by mouth daily.   LACOSAMIDE (VIMPAT) 100 MG TABS    Take 1 tablet (100 mg  total) by mouth 2 (two) times daily.   LEVETIRACETAM (KEPPRA) 750 MG TABLET    Take 2 tablets (1,500 mg total) by mouth in the morning and in the evening daily.   MELATONIN 3 MG TABS    Take 3 mg by mouth at bedtime.   METOPROLOL SUCCINATE (TOPROL-XL) 25 MG 24 HR TABLET    Take 25 mg by mouth 2 (two) times daily.   QUETIAPINE (SEROQUEL) 50 MG TABLET    Take 50 mg by mouth daily.    SERTRALINE (ZOLOFT) 100 MG TABLET    Take 100 mg by mouth daily.   ZOLPIDEM (AMBIEN) 10 MG TABLET    Take 1 tablet (10 mg total) by mouth at bedtime.  Modified Medications   No medications on file  Discontinued Medications   METOPROLOL TARTRATE (LOPRESSOR) 25 MG TABLET    TAKE 1 TABLET BY MOUTH TWICE DAILY     SIGNIFICANT DIAGNOSTIC EXAMS  TODAY:   01-10-17: chest x-ray:Stable cardiac prominence. Aortic atherosclerosis. No edema or consolidation. Aortic Atherosclerosis   01-10-17: ct of head and cervical spine:  CT head: Stable moderate diffuse atrophy, slightly more pronounced involving the cerebellum than elsewhere. 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 No acute infarct. There is patchy periventricular small vessel disease. No mass, hemorrhage, or extra-axial fluid collection. Areas present to the right of the sella, chronic and stable. There appears to be a connection between the posterior sphenoid sinus in this area, possibly residua of old trauma. There are areas of prior nasal trauma and trauma involving the medial left orbital wall. There are foci of paranasal sinus disease currently. There are foci of arterial vascular calcification.  CT cervical spine: No fracture or spondylolisthesis. Multilevel osteoarthritic change present. There are foci of calcification in both carotid and subclavian arteries.  01-11-17:2- d echo:   - Left ventricle: The cavity size was normal. Wall thickness was increased in a pattern of moderate LVH. Systolic function was  normal. The  estimated ejection fraction was in the range of 55% to 60%. Wall motion was normal; there were no regional wall motion abnormalities. The study is not technically sufficient to allow evaluation of LV diastolic function. - Mitral valve: There was mild regurgitation. - Right ventricle: The cavity size was mildly dilated. - Right atrium: The atrium was mildly dilated.  LABS REVIEWED:  TODAY:   01-10-17: wbc 13.1; hgb 14.5; hct 41.3; mcv 97.9; plt 300;glcuose 125;  bun 14; creat 0.97; k+ 4.0; na++ 137; ca 8.6; liver normal albumin 3.2; tsh 1.506 01-11-17: keppra 25.4; drug screen: + opiates 01-12-17: wbc 8.2; hgb 13.1; hct 38.2; mcv 97.7; plt 274; mag 1.9 01-16-17: wbc 7.8; hgb 13.5; hct 39.3;mcv 97.5; plt 218; glucose 104; bun 11; creat 0.74; k+ 4.0; na++ 138; ca 8.6; liver normal albumin 2.9 01-18-17: wbc 8.1; hgb 13.8; hct 39.6; mcv 98.3; plt 208    Review of Systems  Constitutional: Negative for malaise/fatigue.  HENT:       Has headache "10/10" has had an aneurysm   Respiratory: Negative for cough and shortness of breath.   Cardiovascular: Positive for chest pain. Negative for palpitations and leg swelling.       Has chest wall pain   Gastrointestinal: Negative for abdominal pain, constipation and heartburn.  Musculoskeletal: Negative for back pain, joint pain and myalgias.  Skin: Negative.   Neurological: Negative for dizziness.  Psychiatric/Behavioral: The patient is nervous/anxious.     Physical Exam  Constitutional: He is oriented to person, place, and time. He appears well-developed. No distress.  Eyes: Pupils are equal, round, and reactive to light. Conjunctivae are normal.  Neck: Normal range of motion. Neck supple. No thyromegaly present.  Cardiovascular: Normal rate, regular rhythm, normal heart sounds and intact distal pulses.   Pulmonary/Chest: Effort normal and breath sounds normal. No respiratory distress.  Abdominal: Soft. Bowel sounds are normal. He exhibits no  distension. There is no tenderness.  Musculoskeletal: Normal range of motion.  Lymphadenopathy:    He has no cervical adenopathy.  Neurological: He is alert and oriented to person, place, and time.  Skin: Skin is warm and dry. He is not diaphoretic.  Psychiatric: He has a normal mood and affect.    ASSESSMENT/ PLAN:  TODAY:   1. Atrial flutter with RVR:  Heart rate stable: is status post failure DVVC: will continue toprol xl 25 mg twice daily and cardiazem cd 180 mg daily and is taking eliquis 5 mg twice daily   2. Benign essential hypertension: b/p143/78: is stable will continue cardiazm cd 180 mg daily and toprol xl 25 mg twice daily   3. Chronic hepatitis C without hepatic coma: ( has chronic liver disease) no change in status: will continue  4. Migraine headache (is status post cerebral aneurysm repair) is worse will begin imitrex 50  mg daily as needed may repeat in 2 hours as needed no more than 100 mg in 24 hours  5. Seizures: no reports of further seizures:  Will continue vimpat 100 mg twice daily and keppra 1500 mg twice daily   6. Bipolar disorder: no change in status: will continue zoloft 100 mg daily; seroquel 50 mg daily   7. Insomnia: stable will continue ambien 10 mg nightly and melatonin 3 mg nightly   8.  Chronic pain syndrome: chronic chest pain: no change in status: will continue voltaren gel 4 gm to chest four times daily  Will begin ultram 50 mg every 6 hours as needed for pain   9.  Vascular dementia; no change in status: weight is 229 pounds; will not make changes at this time.   10. CVA: previous (bilateral frontal lobes): no change is on elquis 5 mg twice daily will monitor   Will need to follow up with Dr. Graciela HusbandsKlein cardiology: 02-16-17 Will need to follow up with Dr. Anne HahnWillis neurology:  In one week.   MD is aware of resident's narcotic use and is in agreement with current  plan of care. We will attempt to wean resident as apropriate   Synthia Innocent  NP [facility]  Contact [phone] Monday through Friday 8am- 5pm  After hours call [phone]

## 2017-01-23 ENCOUNTER — Encounter: Payer: Self-pay | Admitting: Internal Medicine

## 2017-01-23 ENCOUNTER — Non-Acute Institutional Stay (SKILLED_NURSING_FACILITY): Payer: Medicaid Other | Admitting: Internal Medicine

## 2017-01-23 DIAGNOSIS — F3162 Bipolar disorder, current episode mixed, moderate: Secondary | ICD-10-CM

## 2017-01-23 DIAGNOSIS — R569 Unspecified convulsions: Secondary | ICD-10-CM | POA: Diagnosis not present

## 2017-01-23 DIAGNOSIS — I69351 Hemiplegia and hemiparesis following cerebral infarction affecting right dominant side: Secondary | ICD-10-CM

## 2017-01-23 DIAGNOSIS — G43009 Migraine without aura, not intractable, without status migrainosus: Secondary | ICD-10-CM | POA: Diagnosis not present

## 2017-01-23 DIAGNOSIS — F01518 Vascular dementia, unspecified severity, with other behavioral disturbance: Secondary | ICD-10-CM

## 2017-01-23 DIAGNOSIS — G894 Chronic pain syndrome: Secondary | ICD-10-CM | POA: Diagnosis not present

## 2017-01-23 DIAGNOSIS — I4892 Unspecified atrial flutter: Secondary | ICD-10-CM

## 2017-01-23 DIAGNOSIS — B182 Chronic viral hepatitis C: Secondary | ICD-10-CM

## 2017-01-23 DIAGNOSIS — I10 Essential (primary) hypertension: Secondary | ICD-10-CM | POA: Diagnosis not present

## 2017-01-23 DIAGNOSIS — F0151 Vascular dementia with behavioral disturbance: Secondary | ICD-10-CM

## 2017-01-23 NOTE — Progress Notes (Signed)
Patient ID: [name] [name], male   DOB: [birth year], 56 y.o.   MRN: [id number]     HISTORY AND PHYSICAL   DATE:  January 23, 2017  Location:   [facility] Room Number: 102 B Place of Service: SNF (31)   Extended Emergency Contact Information Primary Emergency Contact: [name] [name] [place] Work Phone: [phone] Mobile Phone: [phone] Relation: Other Secondary Emergency Contact: [name] [name] [place] Home Phone: [phone] Mobile Phone: [phone] Relation: Other  Advanced Directive information Does Patient Have a Medical Advance Directive?: No, Would patient like information on creating a medical advance directive?: No - Patient declined   Full Code  Chief Complaint  Patient presents with  . New Admit To SNF    Admission    HPI:  56 yo male seen today as a new admission into SNF following hospital stay for aflutter with RVR, dementia, sz d/o, hx aneursym repair. He presented to the ED from [name] with chest pressure and HR 200 per EMS. ECG revealed new atrial flutter with RVR not responsive to adenosine. DC cardioversion failed. He was placed on IV cardizem gtt --> po cardizem. eliquis continued. 2D echo revealed EF 55-60%; mild MR; mild dilated RV; mild dilated RA. Albumin 2.9; ALT 19; AST 29; WBC peaked 13.1K-->8.1K; UDS (+) opiates. Blood smear showed lymphocytosis. He presents to SNF for short term rehab.  Today he reports c/a personal items at previous facility. No CP, palpitations. No SOB. He is taking eliquis for anticoagulation. He is a poor historian due to psych d/o and dementia. Hx obtained from chart. No nursing issues. No falls.  Benign essential hypertension - BP stable on cardiazm cd 180 mg daily and toprol xl 25 mg twice daily   Chronic hepatitis C without hepatic coma - he has chronic liver disease. LFTs nml. tx status unknown  Migraine headache with hx cerebral aneurysm s/p repair - improved on  imitrex 50  mg daily as needed (may repeat in 2 hours as needed; max dose of 100 mg in 24 hours)  Seizure d/o - stable on vimpat 100 mg twice daily and keppra 1500 mg twice daily   Bipolar disorder - stable on zoloft 100 mg daily; seroquel 50 mg daily. Psych evaluated him in the hospital and determined that he is incompetent to make appropriate medical decisions  Insomnia - stable on ambien 10 mg nightly and melatonin 3 mg nightly   Chronic pain syndrome - stable on voltaren gel 4 gm to chest four times daily; ultram 50 mg every 6 hours as needed for pain   Vascular dementia - stable without meds for cognition.  Hx CVA involving bilateral frontal lobes - stable on eliquis 5 mg twice daily  Past Medical History:  Diagnosis Date  . Aneurysm (HCC)   . ETOHism (HCC)   . Gait abnormality 05/26/2016  . Hepatitis C   . 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 Hypertension   . Seizure [name] [name])     Past Surgical History:  Procedure Laterality Date  . CEREBRAL ANEURYSM REPAIR     At [facility]    Patient Care Team: [name] [name], [place] as PCP - General (Internal Medicine) [name] Spaniel, MD as Consulting Physician (Neurology)  Social History   Socioeconomic History  . Marital status: Single    Spouse name: Not on file  . Number of children: 3  . Years of education: 59 th  . Highest education level: Not on file  Social Needs  . Financial resource strain: Not on file  . Food insecurity - worry: Not on file  . Food insecurity - inability: Not on file  . Transportation needs - medical: Not on file  . Transportation needs - non-medical: Not on file  Occupational History  . Occupation: disabled  Tobacco Use  . Smoking status: Former Smoker    Packs/day: 0.25    Types: Cigarettes  . Smokeless tobacco: Never Used  Substance and Sexual Activity  . Alcohol use: No    Alcohol/week: 0.0 oz    Comment: quit drinking 11/2011 per patient  . Drug use: No  . Sexual  activity: Not Currently  Other Topics Concern  . Not on file  Social History Narrative   Patient is single and lives at home alone.   Disabled.   Education 11 th grade.   Right handed.   Caffeine none .     reports that he has quit smoking. His smoking use included cigarettes. He smoked 0.25 packs per day. he has never used smokeless tobacco. He reports that he does not drink alcohol or use drugs.  Family History  Problem Relation Age of Onset  . Hypertension Mother   . Cancer Mother   . Hypertension Father   . Diabetes Sister   . Cancer Sister   . Cancer Sister    Family Status  Relation Name Status  . Mother  Deceased at age 50  . Father  Deceased  . Sister  Deceased  . Son  Alive  . Brother  Alive  . Sister  Deceased  . Sister  Alive  . Sister  Alive  . Sister  Alive    Immunization History  Administered Date(s) Administered  . PPD Test 01/20/2017  . Tdap 01/10/2017    Allergies  Allergen Reactions  . Carbamazepine Other (See Comments)    Hyponatremia  . Depakote [Divalproex Sodium] Other (See Comments)    Unknown - on MAR    Medications:   Medication List        Accurate as of 01/23/17 12:23 PM. Always use your most recent med list.          acetaminophen 325 MG tablet Commonly known as:  TYLENOL Take 2 tablets (650 mg total) by mouth every 6 (six) hours as needed for mild pain or headache.   apixaban 5 MG Tabs tablet Commonly known as:  ELIQUIS Take 1 tablet (5 mg total) by mouth every 12 (twelve) hours.   diclofenac sodium 1 % Gel Commonly known as:  VOLTAREN Apply 4 g topically 4 (four) times daily. To chest. Discontinue when pain resolved.   diltiazem 180 MG 24 hr capsule Commonly known as:  CARDIZEM CD Take 1 capsule (180 mg total) by mouth daily.   Lacosamide 100 MG Tabs Commonly known as:  VIMPAT Take 1 tablet (100 mg total) by mouth 2 (two) times daily.   levETIRAcetam 750 MG tablet Commonly known as:  KEPPRA Take 2 tablets  (1,500 mg total) by mouth in the morning and in the evening daily.   Melatonin 3 MG Tabs   metoprolol succinate 25 MG 24 hr tablet Commonly known as:  TOPROL-XL   QUEtiapine 50 MG tablet Commonly known as:  SEROQUEL   sertraline 100 MG tablet Commonly known as:  ZOLOFT   traMADol 50 MG tablet Commonly known as:  ULTRAM Take 1 tablet (50 mg total) by mouth every 6 (six) hours as needed.   Vitamin D3 1000  units Caps   zolpidem 10 MG tablet Commonly known as:  AMBIEN Take 1 tablet (10 mg total) by mouth at bedtime.       Review of Systems  Unable to perform ROS: Psychiatric disorder    Vitals:   01/23/17 0929  BP: 116/85  Pulse: 74  Resp: 18  Temp: (!) 97 F (36.1 C)  SpO2: 97%  Weight: 229 lb 3.2 oz (104 kg)  Height: 6\' 6"  (1.981 m)   Body mass index is 26.49 kg/m.  Physical Exam  Constitutional: He is oriented to person, place, and time. He appears well-developed and well-nourished.  Sitting in bed in NAD  HENT:  Mouth/Throat: Oropharynx is clear and moist.  Poor dentition  Eyes: Pupils are equal, round, and reactive to light. No scleral icterus.  Neck: Neck supple. Carotid bruit is not present. No thyromegaly present.  Cardiovascular: Normal rate, regular rhythm and intact distal pulses. Exam reveals no gallop and no friction rub.  Murmur (1/6 SEM with ectopy) heard. No distal LE edema. No calf TTP  Pulmonary/Chest: Effort normal and breath sounds normal. He has no wheezes. He has no rales. He exhibits no tenderness.  Abdominal: Soft. Normal appearance and bowel sounds are normal. He exhibits no distension, no abdominal bruit, no pulsatile midline mass and no mass. There is no hepatomegaly. There is no tenderness. There is no rigidity, no rebound and no guarding. No hernia.  Lymphadenopathy:    He has no cervical adenopathy.  Neurological: He is alert and oriented to person, place, and time. He has normal reflexes. Gait (uses rolling walker) abnormal.    Right hemiparesis  Skin: Skin is warm and dry. No rash noted.  Psychiatric: He has a normal mood and affect. His behavior is normal. Judgment and thought content normal.  Speech slurred     Labs reviewed: Admission on 01/10/2017, Discharged on 01/20/2017  No results displayed because visit has over 200 results.  CBC Latest Ref Rng & Units 01/18/2017 01/17/2017 01/16/2017  WBC 4.0 - 10.5 K/uL 8.1 7.5 7.8  Hemoglobin 13.0 - 17.0 g/dL 16.1 09.6 04.5  Hematocrit 39.0 - 52.0 % 39.6 39.6 39.3  Platelets 150 - 400 K/uL 208 219 218   CMP Latest Ref Rng & Units 01/16/2017 01/15/2017 01/11/2017  Glucose 65 - 99 mg/dL 409(W) 119(J) 97  BUN 6 - 20 mg/dL 11 8 13   Creatinine 0.61 - 1.24 mg/dL 4.78 2.95 6.21  Sodium 135 - 145 mmol/L 138 137 136  Potassium 3.5 - 5.1 mmol/L 4.0 3.6 4.0  Chloride 101 - 111 mmol/L 107 105 107  CO2 22 - 32 mmol/L 23 23 21(L)  Calcium 8.9 - 10.3 mg/dL 3.0(Q) 6.5(H) 8.4(O)  Total Protein 6.5 - 8.1 g/dL 6.9 - -  Total Bilirubin 0.3 - 1.2 mg/dL 0.4 - -  Alkaline Phos 38 - 126 U/L 92 - -  AST 15 - 41 U/L 29 - -  ALT 17 - 63 U/L 19 - -   Lipid Panel  No results found for: CHOL, TRIG, HDL, CHOLHDL, VLDL, LDLCALC, LDLDIRECT  Lab Results  Component Value Date   HGBA1C 5.7 (H) 01/24/2014       Ct Head Wo Contrast  Result Date: 01/10/2017 CLINICAL DATA:  Pain following fall EXAM: CT HEAD WITHOUT CONTRAST CT CERVICAL SPINE WITHOUT CONTRAST TECHNIQUE: Multidetector CT imaging of the head and cervical spine was performed following the standard protocol without intravenous contrast. Multiplanar CT image reconstructions of the cervical spine were also generated. COMPARISON:  Brain MRI November 03, 2014; head CT and cervical spine CT October 31, 2014 FINDINGS: CT HEAD FINDINGS Brain: Moderate diffuse atrophy is stable. There is no intracranial mass, hemorrhage, extra-axial fluid collection, or midline shift. There are prior infarcts in each frontal lobe, larger on left than on  the right, stable. Elsewhere there is patchy small vessel disease in the centra semiovale bilaterally. No new gray-white compartment lesions evident. No evidence of acute infarct. Air is noted to the right of the sella, chronic and stable from the 2016 study. Vascular: There is no hyperdense vessel. There is calcification in the carotid siphon regions. Skull: Bony calvarium appears intact. Sinuses/Orbits: Old nasal fractures appear stable. There is evidence of old trauma involving the medial left orbital wall, stable. There is mucosal thickening in both maxillary antra as well as in multiple ethmoid air cells. Other visualized paranasal sinuses are clear. No intraorbital lesions are evident. Other: Mastoid air cells are clear. CT CERVICAL SPINE FINDINGS Alignment: There is no appreciable spondylolisthesis. Skull base and vertebrae: Skull base and craniocervical junction regions appear normal. There is no acute fracture. No blastic or lytic bone lesions evident. Soft tissues and spinal canal: Prevertebral soft tissues and predental space regions are normal. There are no paraspinous lesions. There is no evident cord or canal hematoma. Disc levels: There is moderate disc space narrowing at C4-5. There is somewhat more severe disc space narrowing at C5-6, C6-7, and C7-T1. There are prominent anterior osteophytes at C4, C5, C6, and to a slightly lesser extent at C7. There is facet hypertrophy at multiple levels. There is exit foraminal narrowing due to bony hypertrophy on the left at C3-4, at C4-5 bilaterally, and at C6-7 bilaterally. No frank disc extrusion or stenosis. Upper chest: Visualized upper lung zones are clear. Other: There are foci of calcification in both subclavian arteries as well as in both carotid arteries. IMPRESSION: CT head: Stable moderate diffuse atrophy, slightly more pronounced involving the cerebellum than elsewhere. 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 No  acute infarct. There is patchy periventricular small vessel disease. No mass, hemorrhage, or extra-axial fluid collection. Areas present to the right of the sella, chronic and stable. There appears to be a connection between the posterior sphenoid sinus in this area, possibly residua of old trauma. There are areas of prior nasal trauma and trauma involving the medial left orbital wall. There are foci of paranasal sinus disease currently. There are foci of arterial vascular calcification. CT cervical spine: No fracture or spondylolisthesis. Multilevel osteoarthritic change present. There are foci of calcification in both carotid and subclavian arteries. Electronically Signed   By: Bretta Bang III M.D.   On: 01/10/2017 10:54   Ct Cervical Spine Wo Contrast  Result Date: 01/10/2017 CLINICAL DATA:  Pain following fall EXAM: CT HEAD WITHOUT CONTRAST CT CERVICAL SPINE WITHOUT CONTRAST TECHNIQUE: Multidetector CT imaging of the head and cervical spine was performed following the standard protocol without intravenous contrast. Multiplanar CT image reconstructions of the cervical spine were also generated. COMPARISON:  Brain MRI November 03, 2014; head CT and cervical spine CT October 31, 2014 FINDINGS: CT HEAD FINDINGS Brain: Moderate diffuse atrophy is stable. There is no intracranial mass, hemorrhage, extra-axial fluid collection, or midline shift. 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 Elsewhere there is patchy small vessel disease in the centra semiovale bilaterally. No new gray-white compartment lesions evident. No evidence of acute  infarct. Air is noted to the right of the sella, chronic and stable from the 2016 study. Vascular: There is no hyperdense vessel. There is calcification in the carotid siphon regions. Skull: Bony calvarium appears intact. Sinuses/Orbits: Old nasal fractures appear stable. There is evidence of old trauma involving the medial left  orbital wall, stable. There is mucosal thickening in both maxillary antra as well as in multiple ethmoid air cells. Other visualized paranasal sinuses are clear. No intraorbital lesions are evident. Other: Mastoid air cells are clear. CT CERVICAL SPINE FINDINGS Alignment: There is no appreciable spondylolisthesis. Skull base and vertebrae: Skull base and craniocervical junction regions appear normal. There is no acute fracture. No blastic or lytic bone lesions evident. Soft tissues and spinal canal: Prevertebral soft tissues and predental space regions are normal. There are no paraspinous lesions. There is no evident cord or canal hematoma. Disc levels: There is moderate disc space narrowing at C4-5. There is somewhat more severe disc space narrowing at C5-6, C6-7, and C7-T1. There are prominent anterior osteophytes at C4, C5, C6, and to a slightly lesser extent at C7. There is facet hypertrophy at multiple levels. There is exit foraminal narrowing due to bony hypertrophy on the left at C3-4, at C4-5 bilaterally, and at C6-7 bilaterally. No frank disc extrusion or stenosis. Upper chest: Visualized upper lung zones are clear. Other: There are foci of calcification in both subclavian arteries as well as in both carotid arteries. IMPRESSION: CT head: Stable moderate diffuse atrophy, slightly more pronounced involving the cerebellum than elsewhere. 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 No acute infarct. There is patchy periventricular small vessel disease. No mass, hemorrhage, or extra-axial fluid collection. Areas present to the right of the sella, chronic and stable. There appears to be a connection between the posterior sphenoid sinus in this area, possibly residua of old trauma. There are areas of prior nasal trauma and trauma involving the medial left orbital wall. There are foci of paranasal sinus disease currently. There are foci of arterial vascular calcification. CT  cervical spine: No fracture or spondylolisthesis. Multilevel osteoarthritic change present. There are foci of calcification in both carotid and subclavian arteries. Electronically Signed   By: Bretta Bang III M.D.   On: 01/10/2017 10:54   Dg Chest Portable 1 View  Result Date: 01/10/2017 CLINICAL DATA:  Pain following fall.  Supraventricular tachycardia EXAM: PORTABLE CHEST 1 VIEW COMPARISON:  November 04, 2014 FINDINGS: There is no edema or consolidation. Heart is mildly enlarged with pulmonary vascularity within normal limits. No adenopathy. There is aortic atherosclerosis. No pneumothorax. There is evidence of old rib trauma on the right, unchanged. IMPRESSION: Stable cardiac prominence. Aortic atherosclerosis. No edema or consolidation. Aortic Atherosclerosis (ICD10-I70.0). Electronically Signed   By: Bretta Bang III M.D.   On: 01/10/2017 09:16     Assessment/Plan   ICD-10-CM   1. Chronic pain syndrome G89.4   2. Migraine without aura and without status migrainosus, not intractable G43.009   3. Bipolar disorder, current episode mixed, moderate (HCC) F31.62   4. Atrial flutter, unspecified type (HCC) I48.92   5. Seizures (HCC) R56.9   6. Vascular dementia with behavior disturbance F01.51   7. Hemiparesis affecting right side as late effect of cerebrovascular accident (CVA) (HCC) I69.351   8. Essential hypertension, benign I10   9. Chronic hepatitis C without hepatic coma (HCC) B18.2     Change Tylenol 650mg  TID for pain  Cont other meds as ordered  Cont nutritional supplements per facility protocol  PT/OT/ST as ordered  Follow up with specialists (cardio) as scheduled  Psych services to follow  GOAL: short term rehab and d/c home when medically appropriate. Communicated with pt and nursing.  Will follow  Vela Render S. Ancil Linseyarter, D. O., F. A. C. O. I.  [name] [address] [place], [place] [address] [phone] Cell (Monday-Friday 8  AM - 5 PM) [phone] After 5 PM and follow prompts

## 2017-01-24 ENCOUNTER — Other Ambulatory Visit: Payer: Self-pay

## 2017-01-24 MED ORDER — ZOLPIDEM TARTRATE 10 MG PO TABS: 10.0000 mg | ORAL_TABLET | Freq: Every day | ORAL | 0 refills | Status: DC

## 2017-01-24 NOTE — Telephone Encounter (Signed)
RX faxed to [facility] @ [phone], phone number [phone] 

## 2017-01-25 ENCOUNTER — Non-Acute Institutional Stay (SKILLED_NURSING_FACILITY): Payer: Medicaid Other | Admitting: Adult Health

## 2017-01-25 ENCOUNTER — Encounter: Payer: Self-pay | Admitting: Adult Health

## 2017-01-25 DIAGNOSIS — F01518 Vascular dementia, unspecified severity, with other behavioral disturbance: Secondary | ICD-10-CM

## 2017-01-25 DIAGNOSIS — F3162 Bipolar disorder, current episode mixed, moderate: Secondary | ICD-10-CM

## 2017-01-25 DIAGNOSIS — F0151 Vascular dementia with behavioral disturbance: Secondary | ICD-10-CM | POA: Diagnosis not present

## 2017-01-25 DIAGNOSIS — B182 Chronic viral hepatitis C: Secondary | ICD-10-CM | POA: Diagnosis not present

## 2017-01-25 NOTE — Progress Notes (Signed)
Location:   [facility] Room Number: 102 B Place of Service:  SNF (31)   CODE STATUS: Full Code  Allergies  Allergen Reactions  . Carbamazepine Other (See Comments)    Hyponatremia  . Depakote [Divalproex Sodium] Other (See Comments)    [facility]    Chief Complaint  Patient presents with  . Acute Visit    Care Plan Meeting    HPI:  We have come together with the care plan team and resident for his care plan meeting. He did have numerous nursing concerns which have been addressed. We did discuss his medication regimen and his medical status. There are no nursing concerns at this time. We did discuss his most form   Past Medical History:  Diagnosis Date  . Aneurysm (HCC)   . ETOHism (HCC)   . Gait abnormality 05/26/2016  . Hepatitis C   . 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 Hypertension   . Seizure [facility](HCC)     Past Surgical History:  Procedure Laterality Date  . [facility]     [facility]    Social History   Socioeconomic History  . Marital status: Single    Spouse name: Not on file  . Number of children: 3  . Years of education: 1611 th  . Highest education level: Not on file  Social Needs  . Financial resource strain: Not on file  . Food insecurity - worry: Not on file  . Food insecurity - inability: Not on file  . Transportation needs - medical: Not on file  . Transportation needs - non-medical: Not on file  Occupational History  . Occupation: disabled  Tobacco Use  . Smoking status: Former Smoker    Packs/day: 0.25    Types: Cigarettes  . Smokeless tobacco: Never Used  Substance and Sexual Activity  . Alcohol use: No    Alcohol/week: 0.0 oz    Comment: quit drinking 11/2011 per patient  . Drug use: No  . Sexual activity: Not Currently  Other Topics Concern  . Not on file  Social History Narrative   Patient is single and lives at home alone.   Disabled.   Education 11 th grade.   Right handed.   Caffeine none .   Family History  Problem Relation Age of Onset  . Hypertension Mother   . Cancer Mother   . Hypertension Father   . Diabetes Sister   . Cancer Sister   . Cancer Sister       VITAL SIGNS BP 116/85   Pulse 74   Temp (!) 97 F (36.1 C)   Resp 18   Ht 6\' 6"  (1.981 m)   Wt 229 lb 3.2 oz (104 kg)   SpO2 97%   BMI 26.49 kg/m     Medication List        Accurate as of 01/25/17 10:47 AM. Always use your most recent med list.          acetaminophen 325 MG tablet Commonly known as:  TYLENOL   apixaban 5 MG Tabs tablet Commonly known as:  ELIQUIS Take 1 tablet (5 mg total) by mouth every 12 (twelve) hours.   diclofenac sodium 1 % Gel Commonly known as:  VOLTAREN Apply 4 g topically 4 (four) times daily. To chest. Discontinue when pain resolved.   diltiazem 180 MG 24 hr capsule Commonly known as:  CARDIZEM CD Take 1 capsule (180 mg total) by mouth daily.  Lacosamide 100 MG Tabs Commonly known as:  VIMPAT Take 1 tablet (100 mg total) by mouth 2 (two) times daily.   levETIRAcetam 750 MG tablet Commonly known as:  KEPPRA Take 2 tablets (1,500 mg total) by mouth in the morning and in the evening daily.   Melatonin 3 MG Tabs   metoprolol succinate 25 MG 24 hr tablet Commonly known as:  TOPROL-XL   QUEtiapine 50 MG tablet Commonly known as:  SEROQUEL   sertraline 100 MG tablet Commonly known as:  ZOLOFT   traMADol 50 MG tablet Commonly known as:  ULTRAM Take 1 tablet (50 mg total) by mouth every 6 (six) hours as needed.   Vitamin D3 1000 units Caps   zolpidem 10 MG tablet Commonly known as:  AMBIEN Take 1 tablet (10 mg total) at bedtime by mouth.        SIGNIFICANT DIAGNOSTIC EXAMS  PREVIOUS:   01-10-17: chest x-ray:Stable cardiac prominence. Aortic atherosclerosis. No edema or consolidation. Aortic Atherosclerosis   01-10-17: ct of head and cervical spine:  CT head: Stable moderate diffuse atrophy, slightly more  pronounced involving the cerebellum than elsewhere. 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 No acute infarct. There is patchy periventricular small vessel disease. No mass, hemorrhage, or extra-axial fluid collection. Areas present to the right of the sella, chronic and stable. There appears to be a connection between the posterior sphenoid sinus in this area, possibly residua of old trauma. There are areas of prior nasal trauma and trauma involving the medial left orbital wall. There are foci of paranasal sinus disease currently. There are foci of arterial vascular calcification.  CT cervical spine: No fracture or spondylolisthesis. Multilevel osteoarthritic change present. There are foci of calcification in both carotid and subclavian arteries.  01-11-17:2- d echo:   - Left ventricle: The cavity size was normal. Wall thickness was increased in a pattern of moderate LVH. Systolic function was  normal. The estimated ejection fraction was in the range of 55% to 60%. Wall motion was normal; there were no regional wall motion abnormalities. The study is not technically sufficient to allow evaluation of LV diastolic function. - Mitral valve: There was mild regurgitation. - Right ventricle: The cavity size was mildly dilated. - Right atrium: The atrium was mildly dilated.  NO NEW EXAMS   LABS REVIEWED:  PREVIOUS:   01-10-17: wbc 13.1; hgb 14.5; hct 41.3; mcv 97.9; plt 300;glcuose 125; bun 14; creat 0.97; k+ 4.0; na++ 137; ca 8.6; liver normal albumin 3.2; tsh 1.506 01-11-17: keppra 25.4; drug screen: + opiates 01-12-17: wbc 8.2; hgb 13.1; hct 38.2; mcv 97.7; plt 274; mag 1.9 01-16-17: wbc 7.8; hgb 13.5; hct 39.3;mcv 97.5; plt 218; glucose 104; bun 11; creat 0.74; k+ 4.0; na++ 138; ca 8.6; liver normal albumin 2.9 01-18-17: wbc 8.1; hgb 13.8; hct 39.6; mcv 98.3; plt 208   NO NEW LABS    Review of Systems  Constitutional: Negative for malaise/fatigue.    Respiratory: Negative for cough and shortness of breath.   Cardiovascular: Negative for chest pain, palpitations and leg swelling.  Gastrointestinal: Negative for abdominal pain, constipation and heartburn.  Musculoskeletal: Negative for back pain, joint pain and myalgias.  Skin: Negative.   Neurological: Negative for dizziness.  Psychiatric/Behavioral: The patient is not nervous/anxious.      Physical Exam  Constitutional: He is oriented to person, place, and time. He appears well-developed and well-nourished. No distress.  Neck: Neck supple. No thyromegaly present.  Cardiovascular: Normal rate, regular rhythm, normal heart sounds and intact distal pulses.  Pulmonary/Chest: Effort normal and breath sounds normal. No respiratory distress.  Abdominal: Soft. Bowel sounds are normal. He exhibits no distension. There is no tenderness.  Musculoskeletal: Normal range of motion.  Lymphadenopathy:    He has no cervical adenopathy.  Neurological: He is alert and oriented to person, place, and time.  Skin: Skin is warm and dry. He is not diaphoretic.  Psychiatric: He has a normal mood and affect.    ASSESSMENT/ PLAN:  TODAY:   1. Chronic hepatitis C 2. Dementia  3. Bipolar disorder  Will continue his current plan of care and will not make changes Will attempt to have him placed in a less restrictive environment such as assisted living or group home environment     Time spent with patient and care plan team 40 minutes: discussed his overall medical status; medication regimen and MOST form; verbalized understanding.      MD is aware of resident's narcotic use and is in agreement with current plan of care. We will attempt to wean resident as apropriate     Synthia Innocenteborah Green NP [facility]  Contact [phone] Monday through Friday 8am- 5pm  After hours call [phone]

## 2017-02-02 ENCOUNTER — Encounter: Payer: Self-pay | Admitting: Adult Health

## 2017-02-02 NOTE — Progress Notes (Signed)
Location:   [facility] Room Number: 102 B Place of Service:  SNF (31)   CODE STATUS: Full Code  Allergies  Allergen Reactions  . Carbamazepine Other (See Comments)    Hyponatremia  . Depakote [Divalproex Sodium] Other (See Comments)    [facility]    Chief Complaint  Patient presents with  . Acute Visit    Patient Concerns    HPI:    Past Medical History:  Diagnosis Date  . Aneurysm (HCC)   . ETOHism (HCC)   . Gait abnormality 05/26/2016  . Hepatitis C   . 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 Hypertension   . Seizure [facility](HCC)     Past Surgical History:  Procedure Laterality Date  . [facility]ERE[facility]AL AN[facility]PAIR     [facility]  . HARDWARE REMOVAL Left 10/29/2012   Procedure: HARDWARE REMOVAL;  Surgeon: Sheral Apleyimothy D Murphy, MD;  [facility];  Service: Orthopedics;  Laterality: Left;  . I&D EXTREMITY Left 10/26/2012   Procedure: IRRIGATION AND DEBRIDEMENT Left Elbow;  Surgeon: Sheral Apleyimothy D Murphy, MD;  [facility]: [facility] OR;  Service: Orthopedics;  Laterality: Left;  . I&D EXTREMITY Left 10/29/2012   Procedure: IRRIGATION AND DEBRIDEMENT EXTREMITY, wound vac change, stimulan beads;  Surgeon: Sheral Apleyimothy D Murphy, MD;  Location: [facility] OR;  Service: Orthopedics;  Laterality: Left;  lateral on bean bag  . I&D EXTREMITY Left 11/02/2012   Procedure: IRRIGATION AND DEBRIDEMENT EXTREMITY;  Surgeon: Sheral Apleyimothy D Murphy, MD;  Location: [facility] OR;  Service: Orthopedics;  Laterality: Left;  . INCISION AND DRAINAGE ABSCESS Left 11/02/2012   Procedure: INCISION AND DRAINAGE ABSCESS;  Surgeon: Sheral Apleyimothy D Murphy, MD;  Location: [facility] OR;  Service: Orthopedics;  Laterality: Left;    Social History   Socioeconomic History  . Marital status: Single    Spouse name: Not on file  . Number of children: 3  . Years of education: 1411 th  . Highest education level: Not on file  Social Needs  . Financial resource strain: Not on file  . Food insecurity - worry: Not on file  .  Food insecurity - inability: Not on file  . Transportation needs - medical: Not on file  . Transportation needs - non-medical: Not on file  Occupational History  . Occupation: disabled  Tobacco Use  . Smoking status: Former Smoker    Packs/day: 0.25    Types: Cigarettes  . Smokeless tobacco: Never Used  Substance and Sexual Activity  . Alcohol use: No    Alcohol/week: 0.0 oz    Comment: quit drinking 11/2011 per patient  . Drug use: No  . Sexual activity: Not Currently  Other Topics Concern  . Not on file  Social History Narrative   Patient is single and lives at home alone.   Disabled.   Education 11 th grade.   Right handed.   Caffeine none .   Family History  Problem Relation Age of Onset  . Hypertension Mother   . Cancer Mother   . Hypertension Father   . Diabetes Sister   . Cancer Sister   . Cancer Sister       VITAL SIGNS BP 116/83   Pulse 93   Temp 98.9 F (37.2 C)   Resp 20   Ht 6\' 6"  (1.981 m)   Wt 228 lb 1.6 oz (103.5 kg)   SpO2 97%   BMI 26.36 kg/m     Medication List  Accurate as of 02/02/17 11:31 AM. Always use your most recent med list.          acetaminophen 325 MG tablet Commonly known as:  TYLENOL   apixaban 5 MG Tabs tablet Commonly known as:  ELIQUIS Take 1 tablet (5 mg total) by mouth every 12 (twelve) hours.   diltiazem 180 MG 24 hr capsule Commonly known as:  CARDIZEM CD Take 1 capsule (180 mg total) by mouth daily.   Lacosamide 100 MG Tabs Commonly known as:  VIMPAT Take 1 tablet (100 mg total) by mouth 2 (two) times daily.   levETIRAcetam 750 MG tablet Commonly known as:  KEPPRA Take 2 tablets (1,500 mg total) by mouth in the morning and in the evening daily.   Melatonin 3 MG Tabs   metoprolol succinate 25 MG 24 hr tablet Commonly known as:  TOPROL-XL   QUEtiapine 50 MG tablet Commonly known as:  SEROQUEL   sertraline 100 MG tablet Commonly known as:  ZOLOFT   traMADol 50 MG tablet Commonly known as:   ULTRAM Take 1 tablet (50 mg total) by mouth every 6 (six) hours as needed.   Vitamin D3 1000 units Caps   zolpidem 10 MG tablet Commonly known as:  AMBIEN Take 1 tablet (10 mg total) at bedtime by mouth.        SIGNIFICANT DIAGNOSTIC EXAMS       ASSESSMENT/ PLAN:    MD is aware of resident's narcotic use and is in agreement with current plan of care. We will attempt to wean resident as apropriate   Synthia Innocenteborah Green NP [facility]  Contact [phone] Monday through Friday 8am- 5pm  After hours call [phone]

## 2017-02-06 ENCOUNTER — Encounter: Payer: Self-pay | Admitting: Internal Medicine

## 2017-02-08 LAB — HEMOGLOBIN A1C: Hemoglobin A1C: 5.9

## 2017-02-08 LAB — POCT ERYTHROCYTE SEDIMENTATION RATE, NON-AUTOMATED: Sed Rate: 11

## 2017-02-14 NOTE — Progress Notes (Signed)
This encounter was created in error - please disregard.

## 2017-02-16 ENCOUNTER — Encounter: Payer: Self-pay | Admitting: Internal Medicine

## 2017-02-16 ENCOUNTER — Encounter (INDEPENDENT_AMBULATORY_CARE_PROVIDER_SITE_OTHER): Payer: Self-pay

## 2017-02-16 ENCOUNTER — Ambulatory Visit (INDEPENDENT_AMBULATORY_CARE_PROVIDER_SITE_OTHER): Payer: Medicaid Other | Admitting: Internal Medicine

## 2017-02-16 VITALS — BP 104/72 | HR 69 | Ht 76.0 in | Wt 243.2 lb

## 2017-02-16 DIAGNOSIS — I4892 Unspecified atrial flutter: Secondary | ICD-10-CM

## 2017-02-16 DIAGNOSIS — Z01812 Encounter for preprocedural laboratory examination: Secondary | ICD-10-CM | POA: Diagnosis not present

## 2017-02-16 MED ORDER — LACOSAMIDE 100 MG PO TABS: 100.0000 mg | ORAL_TABLET | Freq: Two times a day (BID) | ORAL | 2 refills | Status: DC

## 2017-02-16 NOTE — Progress Notes (Signed)
    Patient Care Team: Curl, David, PA as PCP - General (Internal Medicine) [name], [name], MD as Consulting Physician (Neurology)   HPI  [name] [name] is a 56 y.o. male Seen in follow-up for atrial flutter.  Plan was to undertake atrial flutter ablation in the hospital because of 1: 1, 2:1 conduction. Efforts to pass the TEE probe were unsuccessful and the procedure was terminated.   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 Aneurysm (HCC)   . ETOHism (HCC)   . Gait abnormality 05/26/2016  . Hepatitis C   . 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 Hypertension   . Seizure (HCC)     Past Surgical History:  Procedure Laterality Date  . CEREBRAL ANEURYSM REPAIR     At [facility]  . HARDWARE REMOVAL Left 10/29/2012   Procedure: HARDWARE REMOVAL;  Surgeon: Timothy D Murphy, MD;  Location: [facility] OR;  Service: Orthopedics;  Laterality: Left;  . I&D EXTREMITY Left 10/26/2012   Procedure: IRRIGATION AND DEBRIDEMENT Left Elbow;  Surgeon: Timothy D Murphy, MD;  Location: [facility] OR;  Service: Orthopedics;  Laterality: Left;  . I&D EXTREMITY Left 10/29/2012   Procedure: IRRIGATION AND DEBRIDEMENT EXTREMITY, wound vac change, stimulan beads;  Surgeon: Timothy D Murphy, MD;  Location: [facility] OR;  Service: Orthopedics;  Laterality: Left;  lateral on bean bag  . I&D EXTREMITY Left 11/02/2012   Procedure: IRRIGATION AND DEBRIDEMENT EXTREMITY;  Surgeon: Timothy D Murphy, MD;  Location: [facility] OR;  Service: Orthopedics;  Laterality: Left;  . INCISION AND DRAINAGE ABSCESS Left 11/02/2012   Procedure: INCISION AND DRAINAGE ABSCESS;  Surgeon: Timothy D Murphy, MD;  Location: [facility] OR;  Service: Orthopedics;  Laterality: Left;    Current Outpatient Medications  Medication Sig Dispense Refill  . acetaminophen (TYLENOL) 325 MG tablet Take 650 mg 3 (three) times daily by mouth.    . apixaban (ELIQUIS) 5 MG TABS tablet Take 1 tablet (5 mg total) by mouth every 12 (twelve) hours. 60 tablet 0  . Cholecalciferol (VITAMIN D3) 1000 units CAPS Take 1,000 Units by mouth daily.    . diltiazem (CARDIZEM CD) 180 MG 24 hr capsule Take 1 capsule (180 mg total) by mouth daily. 30 capsule 0  . Lacosamide (VIMPAT) 100 MG TABS Take 1 tablet (100 mg total) by mouth 2 (two) times daily. 60 tablet 2  . levETIRAcetam (KEPPRA) 750 MG tablet Take 1,500 mg by mouth 3 (three) times daily.    . Melatonin 3 MG TABS Take 3 mg by mouth at bedtime.    . metoprolol succinate (TOPROL-XL) 25 MG 24 hr tablet Take 25 mg by mouth 2 (two) times daily.    . QUEtiapine (SEROQUEL) 50 MG tablet Take 50 mg by mouth daily.   1  . sertraline (ZOLOFT) 100 MG tablet Take 100 mg by mouth daily.    . traMADol (ULTRAM) 50 MG tablet Take 1 tablet (50 mg total) by mouth every 6 (six) hours as needed. 120 tablet 0  . zolpidem (AMBIEN) 10 MG tablet   Take 1 tablet (10 mg total) at bedtime by mouth. 30 tablet 0   No current facility-administered medications for this visit.     Allergies  Allergen Reactions  . Carbamazepine Other (See Comments)    Hyponatremia  . Depakote [Divalproex Sodium] Other (See Comments)    Unknown - on MAR      Review of Systems negative except from HPI and PMH  Physical Exam BP 104/72   Pulse 69   Ht 6\' 4"  (1.93 m)   Wt 243 lb 3.2 oz (110.3 kg)   BMI 29.60 kg/m  Well developed and well nourished in no acute distress HENT normal E scleral and icterus clear Neck Supple JVP flat; carotids brisk and full Clear to ausculation Regular rate and rhythm, no murmurs gallops or rub Soft with active bowel  sounds No clubbing cyanosis  Edema Alert and oriented, stilted speech walks with a walker  somewhat unusual affect Skin Warm and Dry  ECG demonstrates sinus at 69 Intervals 18/09/41  Assessment and  Plan  Atrial flutter  Bipolar disorder  Seizure disorder  Inability to pass an endoscopy tube   The patient has atrial flutter and is currently tolerating anticoagulation.  The goal is to get him off of anticoagulation given issues of mental health and compliance.  We would anticipate undertaking catheter ablation with the help of anesthesia. We have reviewed risks and benefits.  I am impressed at his understandijng and grasp of his medical conditions and our previous discussions re ablation and why it was delayed ie TEE  Because of his seizure issues I called and spoke with Dr. Anne HahnWillis.  We will resume his previous antiepileptics.  I will review this with the patient when he returns for his ablation.  He may need earlier follow-up with neurology.  His bipolar disorder is apparently reasonably compensated   He worries which of his personalities might show up  Current medicines are reviewed at length with the patient today .  The patient does  have concerns regarding medicines As above .

## 2017-02-16 NOTE — Patient Instructions (Addendum)
Medication Instructions: Your physician has recommended you make the following change in your medication:  -1) STOP Diltiazem (Cardizem) -2) RESUME Vimpat 100 mg - Take 1 tablet by mouth twice daily  Labwork: Your physician has recommended that you have lab work today: BMET and CBC   Procedures/Testing: Your physician has recommended that you have an Atrial Flutter ablation. Catheter ablation is a medical procedure used to treat some cardiac arrhythmias (irregular heartbeats). During catheter ablation, a long, thin, flexible tube is put into a blood vessel in your groin (upper thigh), or neck. This tube is called an ablation catheter. It is then guided to your heart through the blood vessel. Radio frequency waves destroy small areas of heart tissue where abnormal heartbeats may cause an arrhythmia to start. Please see the instruction sheet given to you today.  Follow-Up: Your physician recommends that you schedule a follow-up appointment in: 4 weeks after 03/03/17 with Francis Dowseenee Ursuy, PA-C   If you need a refill on your cardiac medications before your next appointment, please call your pharmacy.

## 2017-02-16 NOTE — H&P (View-Only) (Signed)
Patient Care Team: Mortimer Friesurl, David, [place]PA as PCP - General (Internal Medicine) York SpanielWillis, [name], MD as Consulting Physician (Neurology)   HPI  [name] [name] is a 56 y.o. male Seen in follow-up for atrial flutter.  Plan was to undertake atrial flutter ablation in the hospital because of 1: 1, 2:1 conduction. Efforts to pass the TEE probe were unsuccessful and the procedure was terminated.   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 Aneurysm (HCC)   . ETOHism (HCC)   . Gait abnormality 05/26/2016  . Hepatitis C   . 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 Hypertension   . Seizure [facility](HCC)     Past Surgical History:  Procedure Laterality Date  . CEREBRAL AN[facility]YSM REPAIR     [facility]  . HARDWARE REMOVAL Left 10/29/2012   Procedure: HARDWARE REMOVAL;  Surgeon: Sheral Apleyimothy D Murphy, MD;  [facility];  Service: Orthopedics;  Laterality: Left;  . I&D EXTREMITY Left 10/26/2012   Procedure: IRRIGATION AND DEBRIDEMENT Left Elbow;  Surgeon: Sheral Apleyimothy D Murphy, MD;  Location: [facility] OR;  Service: Orthopedics;  Laterality: Left;  . I&D EXTREMITY Left 10/29/2012   Procedure: IRRIGATION AND DEBRIDEMENT EXTREMITY, wound vac change, stimulan beads;  Surgeon: Sheral Apleyimothy D Murphy, MD;  Location: [facility] OR;  Service: Orthopedics;  Laterality: Left;  lateral on bean bag  . I&D EXTREMITY Left 11/02/2012   Procedure: IRRIGATION AND DEBRIDEMENT EXTREMITY;  Surgeon: Sheral Apleyimothy D Murphy, MD;  Location: [facility] OR;  Service: Orthopedics;  Laterality: Left;  . INCISION AND DRAINAGE ABSCESS Left 11/02/2012   Procedure: INCISION AND DRAINAGE ABSCESS;  Surgeon: Sheral Apleyimothy D Murphy, MD;  Location: [facility] OR;  Service: Orthopedics;  Laterality: Left;    Current Outpatient Medications  Medication Sig Dispense Refill  . acetaminophen (TYLENOL) 325 MG tablet Take 650 mg 3 (three) times daily by mouth.    Marland Kitchen. apixaban (ELIQUIS) 5 MG TABS tablet Take 1 tablet (5 mg total) by mouth every 12 (twelve) hours. 60 tablet 0  . Cholecalciferol (VITAMIN D3) 1000 units CAPS Take 1,000 Units by mouth daily.    Marland Kitchen. diltiazem (CARDIZEM CD) 180 MG 24 hr capsule Take 1 capsule (180 mg total) by mouth daily. 30 capsule 0  . Lacosamide (VIMPAT) 100 MG TABS Take 1 tablet (100 mg total) by mouth 2 (two) times daily. 60 tablet 2  . levETIRAcetam (KEPPRA) 750 MG tablet Take 1,500 mg by mouth 3 (three) times daily.    . Melatonin 3 MG TABS Take 3 mg by mouth at bedtime.    . metoprolol succinate (TOPROL-XL) 25 MG 24 hr tablet Take 25 mg by mouth 2 (two) times daily.    . QUEtiapine (SEROQUEL) 50 MG tablet Take 50 mg by mouth daily.   1  . sertraline (ZOLOFT) 100 MG tablet Take 100 mg by mouth daily.    . traMADol (ULTRAM) 50 MG tablet Take 1 tablet (50 mg total) by mouth every 6 (six) hours as needed. 120 tablet 0  . zolpidem (AMBIEN) 10 MG tablet  Take 1 tablet (10 mg total) at bedtime by mouth. 30 tablet 0   No current facility-administered medications for this visit.     Allergies  Allergen Reactions  . Carbamazepine Other (See Comments)    Hyponatremia  . Depakote [Divalproex Sodium] Other (See Comments)    Unknown - on MAR      Review of Systems negative except from HPI and PMH  Physical Exam BP 104/72   Pulse 69   Ht 6\' 4"  (1.93 m)   Wt 243 lb 3.2 oz (110.3 kg)   BMI 29.60 kg/m  Well developed and well nourished in no acute distress HENT normal E scleral and icterus clear Neck Supple JVP flat; carotids brisk and full Clear to ausculation Regular rate and rhythm, no murmurs gallops or rub Soft with active bowel  sounds No clubbing cyanosis  Edema Alert and oriented, stilted speech walks with a walker  somewhat unusual affect Skin Warm and Dry  ECG demonstrates sinus at 69 Intervals 18/09/41  Assessment and  Plan  Atrial flutter  Bipolar disorder  Seizure disorder  Inability to pass an endoscopy tube   The patient has atrial flutter and is currently tolerating anticoagulation.  The goal is to get him off of anticoagulation given issues of mental health and compliance.  We would anticipate undertaking catheter ablation with the help of anesthesia. We have reviewed risks and benefits.  I am impressed at his understandijng and grasp of his medical conditions and our previous discussions re ablation and why it was delayed ie TEE  Because of his seizure issues I called and spoke with Dr. Anne HahnWillis.  We will resume his previous antiepileptics.  I will review this with the patient when he returns for his ablation.  He may need earlier follow-up with neurology.  His bipolar disorder is apparently reasonably compensated   He worries which of his personalities might show up  Current medicines are reviewed at length with the patient today .  The patient does  have concerns regarding medicines As above .

## 2017-02-17 ENCOUNTER — Telehealth: Payer: Self-pay | Admitting: Internal Medicine

## 2017-02-17 LAB — BASIC METABOLIC PANEL
BUN / CREAT RATIO: 21 — AB (ref 9–20)
BUN: 19 mg/dL (ref 6–24)
CALCIUM: 9.4 mg/dL (ref 8.7–10.2)
CO2: 23 mmol/L (ref 20–29)
CREATININE: 0.92 mg/dL (ref 0.76–1.27)
Chloride: 106 mmol/L (ref 96–106)
GFR, EST AFRICAN AMERICAN: 106 mL/min/{1.73_m2} (ref >59)
GFR, EST NON AFRICAN AMERICAN: 91 mL/min/{1.73_m2} (ref >59)
Glucose: 97 mg/dL (ref 65–99)
Potassium: 5.3 mmol/L — ABNORMAL HIGH (ref 3.5–5.2)
Sodium: 147 mmol/L — ABNORMAL HIGH (ref 134–144)

## 2017-02-17 LAB — CBC WITH DIFFERENTIAL/PLATELET
BASOS: 0 %
Basophils Absolute: 0 10*3/uL (ref 0.0–0.2)
EOS (ABSOLUTE): 0.1 10*3/uL (ref 0.0–0.4)
EOS: 1 %
HEMATOCRIT: 42.9 % (ref 37.5–51.0)
HEMOGLOBIN: 14.3 g/dL (ref 13.0–17.7)
IMMATURE GRANS (ABS): 0 10*3/uL (ref 0.0–0.1)
Immature Granulocytes: 1 %
LYMPHS ABS: 4.8 10*3/uL — AB (ref 0.7–3.1)
LYMPHS: 58 %
MCH: 32.8 pg (ref 26.6–33.0)
MCHC: 33.3 g/dL (ref 31.5–35.7)
MCV: 98 fL — ABNORMAL HIGH (ref 79–97)
MONOCYTES: 7 %
Monocytes Absolute: 0.6 10*3/uL (ref 0.1–0.9)
NEUTROS ABS: 2.7 10*3/uL (ref 1.4–7.0)
Neutrophils: 33 %
Platelets: 176 10*3/uL (ref 150–379)
RBC: 4.36 x10E6/uL (ref 4.14–5.80)
RDW: 13.4 % (ref 12.3–15.4)
WBC: 8.2 10*3/uL (ref 3.4–10.8)

## 2017-02-17 NOTE — Telephone Encounter (Signed)
Follow up      **addendum to previous note.  [facility]yr[facility] at [facility]  also requesting information on VIMPAT.  Please call

## 2017-02-17 NOTE — Telephone Encounter (Signed)
I refaxed the same information Malachy Chamberay Kenan, CMA states she faxed yesterday to [phone]

## 2017-02-17 NOTE — Telephone Encounter (Signed)
New message    Hulan Frayyrinda calling from [facility] [phone] Fax # [phone]  Hulan Frayyrinda requesting updated medication notes and information about procedure scheduled for 12/14.

## 2017-02-17 NOTE — Telephone Encounter (Signed)
Spoke with nurse at [facility] who answered for [place]. She states the paperwork that was supposed to be faxed yesterday for the patient's procedure was not received. I verified the fax number and advised that we will resend. She thanked me for the call.

## 2017-02-23 ENCOUNTER — Other Ambulatory Visit: Payer: Self-pay

## 2017-02-23 LAB — HM DIABETES EYE EXAM

## 2017-02-23 MED ORDER — LACOSAMIDE 100 MG PO TABS: 100.0000 mg | ORAL_TABLET | Freq: Two times a day (BID) | ORAL | 0 refills | Status: DC

## 2017-02-23 NOTE — Telephone Encounter (Signed)
RX faxed to [facility] @ [phone], phone number [phone] 

## 2017-03-01 ENCOUNTER — Other Ambulatory Visit: Payer: Self-pay

## 2017-03-01 MED ORDER — ZOLPIDEM TARTRATE 10 MG PO TABS: 10.0000 mg | ORAL_TABLET | Freq: Every day | ORAL | 0 refills | Status: DC

## 2017-03-01 NOTE — Telephone Encounter (Signed)
RX faxed to [facility] @ [phone], phone number [phone] 

## 2017-03-03 ENCOUNTER — Ambulatory Visit (HOSPITAL_COMMUNITY): Payer: Medicaid Other | Admitting: Certified Registered Nurse Anesthetist

## 2017-03-03 ENCOUNTER — Ambulatory Visit (HOSPITAL_COMMUNITY)
Admission: RE | Admit: 2017-03-03 | Discharge: 2017-03-04 | Disposition: A | Discharge status: Home / Self Care | Payer: Medicaid Other | Source: Ambulatory Visit | Attending: Internal Medicine | Admitting: Internal Medicine

## 2017-03-03 ENCOUNTER — Encounter (HOSPITAL_COMMUNITY)
Admission: RE | Disposition: A | Discharge status: Home / Self Care | Payer: Self-pay | Source: Ambulatory Visit | Attending: Internal Medicine

## 2017-03-03 ENCOUNTER — Encounter (HOSPITAL_COMMUNITY): Payer: Self-pay | Admitting: General Practice

## 2017-03-03 DIAGNOSIS — F319 Bipolar disorder, unspecified: Secondary | ICD-10-CM | POA: Insufficient documentation

## 2017-03-03 DIAGNOSIS — F0390 Unspecified dementia without behavioral disturbance: Secondary | ICD-10-CM | POA: Insufficient documentation

## 2017-03-03 DIAGNOSIS — Z7901 Long term (current) use of anticoagulants: Secondary | ICD-10-CM | POA: Diagnosis not present

## 2017-03-03 DIAGNOSIS — F22 Delusional disorders: Secondary | ICD-10-CM | POA: Diagnosis present

## 2017-03-03 DIAGNOSIS — B1920 Unspecified viral hepatitis C without hepatic coma: Secondary | ICD-10-CM | POA: Diagnosis not present

## 2017-03-03 DIAGNOSIS — G40909 Epilepsy, unspecified, not intractable, without status epilepticus: Secondary | ICD-10-CM | POA: Insufficient documentation

## 2017-03-03 DIAGNOSIS — I4892 Unspecified atrial flutter: Secondary | ICD-10-CM

## 2017-03-03 DIAGNOSIS — I10 Essential (primary) hypertension: Secondary | ICD-10-CM | POA: Diagnosis not present

## 2017-03-03 DIAGNOSIS — R55 Syncope and collapse: Secondary | ICD-10-CM

## 2017-03-03 DIAGNOSIS — R569 Unspecified convulsions: Secondary | ICD-10-CM | POA: Diagnosis not present

## 2017-03-03 DIAGNOSIS — I4891 Unspecified atrial fibrillation: Secondary | ICD-10-CM

## 2017-03-03 DIAGNOSIS — Z79899 Other long term (current) drug therapy: Secondary | ICD-10-CM | POA: Insufficient documentation

## 2017-03-03 HISTORY — DX: Unspecified atrial fibrillation: I48.91

## 2017-03-03 HISTORY — PX: ATRIAL FLUTTER ABLATION: SHX5733

## 2017-03-03 HISTORY — PX: A-FLUTTER ABLATION: EP1230

## 2017-03-03 LAB — COMPREHENSIVE METABOLIC PANEL
ALBUMIN: 3.7 g/dL (ref 3.5–5.0)
ALK PHOS: 70 U/L (ref 38–126)
ALT: 24 U/L (ref 17–63)
AST: 31 U/L (ref 15–41)
Anion gap: 8 (ref 5–15)
BUN: 15 mg/dL (ref 6–20)
CALCIUM: 8.7 mg/dL — AB (ref 8.9–10.3)
CO2: 22 mmol/L (ref 22–32)
CREATININE: 0.73 mg/dL (ref 0.61–1.24)
Chloride: 108 mmol/L (ref 101–111)
GFR calc Af Amer: >60 mL/min (ref >60)
GFR calc non Af Amer: >60 mL/min (ref >60)
GLUCOSE: 122 mg/dL — AB (ref 65–99)
Potassium: 4.2 mmol/L (ref 3.5–5.1)
SODIUM: 138 mmol/L (ref 135–145)
Total Bilirubin: 0.8 mg/dL (ref 0.3–1.2)
Total Protein: 7.6 g/dL (ref 6.5–8.1)

## 2017-03-03 LAB — AMMONIA: Ammonia: 40 umol/L — ABNORMAL HIGH (ref 9–35)

## 2017-03-03 SURGERY — A-FLUTTER ABLATION
Anesthesia: General

## 2017-03-03 MED ORDER — QUETIAPINE FUMARATE 50 MG PO TABS
50.0000 mg | ORAL_TABLET | Freq: Every day | ORAL | Status: DC
  Administered 2017-03-04: 10:00:00 50 mg via ORAL
  Filled 2017-03-03: qty 1

## 2017-03-03 MED ORDER — FENTANYL CITRATE (PF) 250 MCG/5ML IJ SOLN
INTRAMUSCULAR | Status: DC | PRN
  Administered 2017-03-03 (×2): 50 ug via INTRAVENOUS

## 2017-03-03 MED ORDER — VITAMIN D 1000 UNITS PO TABS
1000.0000 [IU] | ORAL_TABLET | Freq: Every day | ORAL | Status: DC
  Administered 2017-03-04: 1000 [IU] via ORAL
  Filled 2017-03-03 (×2): qty 1

## 2017-03-03 MED ORDER — SODIUM CHLORIDE 0.9 % IV SOLN
INTRAVENOUS | Status: DC
  Administered 2017-03-03: 13:00:00 via INTRAVENOUS

## 2017-03-03 MED ORDER — BUPIVACAINE HCL (PF) 0.25 % IJ SOLN
INTRAMUSCULAR | Status: AC
  Filled 2017-03-03: qty 60

## 2017-03-03 MED ORDER — EPHEDRINE SULFATE 50 MG/ML IJ SOLN
INTRAMUSCULAR | Status: DC | PRN
  Administered 2017-03-03: 10 mg via INTRAVENOUS

## 2017-03-03 MED ORDER — SODIUM CHLORIDE 0.9% FLUSH
3.0000 mL | Freq: Two times a day (BID) | INTRAVENOUS | Status: DC
  Administered 2017-03-03: 3 mL via INTRAVENOUS

## 2017-03-03 MED ORDER — SODIUM CHLORIDE 0.9% FLUSH: 3.0000 mL | INTRAVENOUS | Status: DC | PRN

## 2017-03-03 MED ORDER — BUPIVACAINE HCL (PF) 0.25 % IJ SOLN
INTRAMUSCULAR | Status: DC | PRN
  Administered 2017-03-03: 60 mL

## 2017-03-03 MED ORDER — ACETAMINOPHEN 325 MG PO TABS: 650.0000 mg | ORAL_TABLET | Freq: Three times a day (TID) | ORAL | Status: DC

## 2017-03-03 MED ORDER — OFF THE BEAT BOOK
Freq: Once | Status: AC
  Administered 2017-03-04: 03:00:00
  Filled 2017-03-03: qty 1

## 2017-03-03 MED ORDER — PROPOFOL 10 MG/ML IV BOLUS
INTRAVENOUS | Status: DC | PRN
  Administered 2017-03-03: 200 mg via INTRAVENOUS

## 2017-03-03 MED ORDER — GLYCOPYRROLATE 0.2 MG/ML IJ SOLN
INTRAMUSCULAR | Status: DC | PRN
  Administered 2017-03-03: 0.2 mg via INTRAVENOUS

## 2017-03-03 MED ORDER — TRAMADOL HCL 50 MG PO TABS
50.0000 mg | ORAL_TABLET | Freq: Four times a day (QID) | ORAL | Status: DC | PRN
  Administered 2017-03-03 – 2017-03-04 (×2): 50 mg via ORAL
  Filled 2017-03-03 (×2): qty 1

## 2017-03-03 MED ORDER — SUCCINYLCHOLINE 20MG/ML (10ML) SYRINGE FOR MEDFUSION PUMP - OPTIME
INTRAMUSCULAR | Status: DC | PRN
  Administered 2017-03-03: 120 mg via INTRAVENOUS

## 2017-03-03 MED ORDER — SODIUM CHLORIDE 0.9 % IV SOLN
INTRAVENOUS | Status: AC
  Administered 2017-03-03: 16:00:00 via INTRAVENOUS

## 2017-03-03 MED ORDER — MELATONIN 3 MG PO TABS
3.0000 mg | ORAL_TABLET | Freq: Every day | ORAL | Status: DC
  Administered 2017-03-03: 3 mg via ORAL
  Filled 2017-03-03: qty 1

## 2017-03-03 MED ORDER — ONDANSETRON HCL 4 MG/2ML IJ SOLN
INTRAMUSCULAR | Status: DC | PRN
  Administered 2017-03-03: 4 mg via INTRAVENOUS

## 2017-03-03 MED ORDER — ZOLPIDEM TARTRATE 5 MG PO TABS
10.0000 mg | ORAL_TABLET | Freq: Every day | ORAL | Status: DC
  Administered 2017-03-03: 20:00:00 10 mg via ORAL
  Filled 2017-03-03: qty 2

## 2017-03-03 MED ORDER — DEXAMETHASONE SODIUM PHOSPHATE 10 MG/ML IJ SOLN
INTRAMUSCULAR | Status: DC | PRN
  Administered 2017-03-03: 10 mg via INTRAVENOUS

## 2017-03-03 MED ORDER — SODIUM CHLORIDE 0.9 % IV SOLN: 250.0000 mL | INTRAVENOUS | Status: DC | PRN

## 2017-03-03 MED ORDER — ROCURONIUM BROMIDE 10 MG/ML (PF) SYRINGE
PREFILLED_SYRINGE | INTRAVENOUS | Status: DC | PRN
  Administered 2017-03-03: 50 mg via INTRAVENOUS
  Administered 2017-03-03: 20 mg via INTRAVENOUS

## 2017-03-03 MED ORDER — MIDAZOLAM HCL 2 MG/2ML IJ SOLN
INTRAMUSCULAR | Status: DC | PRN
  Administered 2017-03-03: 2 mg via INTRAVENOUS

## 2017-03-03 MED ORDER — SERTRALINE HCL 50 MG PO TABS
100.0000 mg | ORAL_TABLET | Freq: Every day | ORAL | Status: DC
  Administered 2017-03-04: 100 mg via ORAL
  Filled 2017-03-03 (×2): qty 2

## 2017-03-03 MED ORDER — LACOSAMIDE 50 MG PO TABS
100.0000 mg | ORAL_TABLET | Freq: Two times a day (BID) | ORAL | Status: DC
  Administered 2017-03-03 – 2017-03-04 (×2): 100 mg via ORAL
  Filled 2017-03-03 (×2): qty 2

## 2017-03-03 MED ORDER — LACTATED RINGERS IV SOLN
INTRAVENOUS | Status: DC | PRN
  Administered 2017-03-03 (×2): via INTRAVENOUS

## 2017-03-03 MED ORDER — LIDOCAINE 2% (20 MG/ML) 5 ML SYRINGE
INTRAMUSCULAR | Status: DC | PRN
  Administered 2017-03-03: 100 mg via INTRAVENOUS

## 2017-03-03 MED ORDER — ACETAMINOPHEN 500 MG PO TABS
1000.0000 mg | ORAL_TABLET | Freq: Three times a day (TID) | ORAL | Status: DC
  Administered 2017-03-03 – 2017-03-04 (×2): 1000 mg via ORAL
  Filled 2017-03-03 (×2): qty 2

## 2017-03-03 MED ORDER — PHENYLEPHRINE HCL 10 MG/ML IJ SOLN
INTRAVENOUS | Status: DC | PRN
  Administered 2017-03-03: 25 ug/min via INTRAVENOUS

## 2017-03-03 MED ORDER — SUGAMMADEX SODIUM 200 MG/2ML IV SOLN
INTRAVENOUS | Status: DC | PRN
  Administered 2017-03-03: 250 mg via INTRAVENOUS

## 2017-03-03 MED ORDER — LEVETIRACETAM 750 MG PO TABS
1500.0000 mg | ORAL_TABLET | Freq: Two times a day (BID) | ORAL | Status: DC
  Administered 2017-03-03 – 2017-03-04 (×2): 1500 mg via ORAL
  Filled 2017-03-03 (×2): qty 2

## 2017-03-03 SURGICAL SUPPLY — 14 items
BAG SNAP BAND KOVER 36X36 (MISCELLANEOUS) ×2 IMPLANT
BLANKET WARM UNDERBOD FULL ACC (MISCELLANEOUS) ×2 IMPLANT
CATH BLAZERPRIME XP LG CV 10MM (ABLATOR) ×2 IMPLANT
CATH DUODECA/ISMUS 7FR REPROC (CATHETERS) ×2 IMPLANT
CATH OCTAPOLOR 6F 125CM 2-5-2 (CATHETERS) ×2 IMPLANT
CATH QUAD COURNAND 5FR (CATHETERS) ×2 IMPLANT
PACK EP LATEX FREE (CUSTOM PROCEDURE TRAY) ×1
PACK EP LF (CUSTOM PROCEDURE TRAY) ×1 IMPLANT
PAD DEFIB LIFELINK (PAD) ×2 IMPLANT
SHEATH ATRIAL FLUTTER SAFL 8F (SHEATH) ×2 IMPLANT
SHEATH PINNACLE 6F 10CM (SHEATH) ×2 IMPLANT
SHEATH PINNACLE 7F 10CM (SHEATH) ×2 IMPLANT
SHEATH PINNACLE 8F 10CM (SHEATH) ×4 IMPLANT
SHIELD RADPAD SCOOP 12X17 (MISCELLANEOUS) ×2 IMPLANT

## 2017-03-03 NOTE — Interval H&P Note (Signed)
History and Physical Interval Note:  03/03/2017 12:22 PM  [name] [name]  has presented today for surgery, with the diagnosis of Atrial Flutter  The various methods of treatment have been discussed with the patient and family. After consideration of risks, benefits and other options for treatment, the patient has consented to  Procedure(s): A-FLUTTER ABLATION (N/A) as a surgical intervention .  The patient's history has been reviewed, patient examined, no change in status, stable for surgery.  I have reviewed the patient's chart and labs.  Questions were answered to the patient's satisfaction.     Sherryl MangesSteven Reshanda Lewey

## 2017-03-03 NOTE — Consult Note (Signed)
NEURO HOSPITALIST CONSULT NOTE   Requestig physician: Dr. Graciela HusbandsKlein   Reason for Consult: Antiepileptic medication adjustment   History obtained from: Majority from chart as patient is under general anesthesia and having difficulty answering questions.  HPI:                                                                                                                                          [name] [name] is an 56 y.o. 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 The patient has had intractable seizures. He has been on carbamazepine and Keppra, but the carbamazepine resulted in hyponatremia requiring cessation of the drug.  According to his last note from his primary neurologist Dr. Anne HahnWillis which was back in March 2018 patient claimed that he had increased seizures at least once a week.  In addition he has also had recent falls.  Patient at that time was on Keppra 1500 mg twice daily and Depakote 500 mg 24-hour tablet 1 tablet by mouth every morning.  At the end of that note it also makes mention that the history coming from the patient was not exactly clear as the patient was confused and is unsure if his medications or chronology of events was clear.  He had to have Depakote stopped due to severe thrombocytopenia.  Today neurology was consulted as he has been having increased number of seizures per patient prior to cardiac catheterization.  Upon the time that I did do the consult initially patient was still under general anesthesia and very confused.  He was perseverating on the fact that he did not feel that the Keppra was doing any good and he felt he wanted to be back on the Tegretol.  On Second attempt to talk to patient he stated he his having seizures every day to every other day. Sometimes two a day.  He does not understand why he cannot been Tegretol even though it has been explained in depth about the  hyponatremia.  Past Medical History:  Diagnosis Date  . Aneurysm (HCC)   . ETOHism (HCC)   . Gait abnormality 05/26/2016  . Hepatitis C   . 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 Hypertension   . Seizure [facility](HCC)     Past Surgical History:  Procedure Laterality Date  . CEREBRAL AN[facility]M REPAIR     [facility]  . HARDWARE REMOVAL Left 10/29/2012   Procedure: HARDWARE REMOVAL;  Surgeon: Sheral Apleyimothy D Murphy, MD;  [facility];  Service: Orthopedics;  Laterality: Left;  . I&D EXTREMITY Left 10/26/2012   Procedure: IRRIGATION AND DEBRIDEMENT Left Elbow;  Surgeon: Sheral Apleyimothy D Murphy, MD;  Location: [facility] OR;  Service: Orthopedics;  Laterality: Left;  . I&D EXTREMITY Left 10/29/2012  Procedure: IRRIGATION AND DEBRIDEMENT EXTREMITY, wound vac change, stimulan beads;  Surgeon: Sheral Apleyimothy D Murphy, MD;  Location: [facility] OR;  Service: Orthopedics;  Laterality: Left;  lateral on bean bag  . I&D EXTREMITY Left 11/02/2012   Procedure: IRRIGATION AND DEBRIDEMENT EXTREMITY;  Surgeon: Sheral Apleyimothy D Murphy, MD;  Location: [facility] OR;  Service: Orthopedics;  Laterality: Left;  . INCISION AND DRAINAGE ABSCESS Left 11/02/2012   Procedure: INCISION AND DRAINAGE ABSCESS;  Surgeon: Sheral Apleyimothy D Murphy, MD;  Location: [facility] OR;  Service: Orthopedics;  Laterality: Left;    Family History  Problem Relation Age of Onset  . Hypertension Mother   . Cancer Mother   . Hypertension Father   . Diabetes Sister   . Cancer Sister   . Cancer Sister      Social History:  reports that he has been smoking cigarettes.  He has been smoking about 0.25 packs per day. he has never used smokeless tobacco. He reports that he does not drink alcohol or use drugs.  Allergies  Allergen Reactions  . Carbamazepine Other (See Comments)    Hyponatremia  . Depakote [Divalproex Sodium] Other (See Comments)    Unknown - on MAR    MEDICATIONS:                                                                                                                      Depakote and Keppra as above Lopressor 25 mg 1 tab twice daily Seroquel 50 mg by mouth daily Zoloft 100 mg daily by mouth   ROS:                                                                                                                                       History obtained from unobtainable from patient due to Being under general anesthesia and confused    Blood pressure 131/71, pulse 65, temperature (!) 97 F (36.1 C), temperature source Temporal, resp. rate 19, height 6\' 5"  (1.956 m), weight 102.5 kg (226 lb), SpO2 97 %.   Neurologic Examination:  HEENT-  Normocephalic, no lesions, without obvious abnormality.  Normal external eye and conjunctiva.  Normal TM's bilaterally.  Normal auditory canals and external ears. Normal external nose, mucus membranes and septum.  Normal pharynx. Cardiovascular- S1, S2 normal, pulses palpable throughout   Lungs- chest clear, no wheezing, rales, normal symmetric air entry Abdomen- normal findings: bowel sounds normal Extremities- no edema Lymph-no adenopathy palpable Musculoskeletal-no joint tenderness, deformity or swelling Skin-warm and dry, no hyperpigmentation, vitiligo, or suspicious lesions  Neurological Examination Mental Status: Alert, currently not oriented.  Speech fluent without evidence of aphasia.  Able to follow simple commands without difficulty. Cranial Nerves: II: Discs flat bilaterally; Visual fields grossly normal,  III,IV, VI: ptosis not present, extra-ocular motions intact bilaterally pupils equal, round, reactive to light and accommodation V,VII: smile symmetric, facial light touch sensation normal bilaterally VIII: hearing normal bilaterally IX,X: uvula rises symmetrically XI: bilateral shoulder shrug XII: midline tongue extension Motor: Moving all extremities antigravity with good strength  5/5 Sensory: Pinprick and light touch intact throughout, bilaterally Deep Tendon Reflexes: 1+ and symmetric throughout Plantars: Right: downgoing   Left: downgoing Cerebellar: Unable to test due to confusion Gait: Not tested      Lab Results: Basic Metabolic Panel: No results for input(s): NA, K, CL, CO2, GLUCOSE, BUN, CREATININE, CALCIUM, MG, PHOS in the last 168 hours.  Liver Function Tests: No results for input(s): AST, ALT, ALKPHOS, BILITOT, PROT, ALBUMIN in the last 168 hours. No results for input(s): LIPASE, AMYLASE in the last 168 hours. No results for input(s): AMMONIA in the last 168 hours.  CBC: No results for input(s): WBC, NEUTROABS, HGB, HCT, MCV, PLT in the last 168 hours.  Cardiac Enzymes: No results for input(s): CKTOTAL, CKMB, CKMBINDEX, TROPONINI in the last 168 hours.  Lipid Panel: No results for input(s): CHOL, TRIG, HDL, CHOLHDL, VLDL, LDLCALC in the last 168 hours.  CBG: No results for input(s): GLUCAP in the last 168 hours.  Microbiology: Results for orders placed or performed during the hospital encounter of 01/10/17  MRSA PCR Screening     Status: None   Collection Time: 01/10/17  5:57 PM  Result Value Ref Range Status   MRSA by PCR NEGATIVE NEGATIVE Final    Comment:        The GeneXpert MRSA Assay (FDA approved for NASAL specimens only), is one component of a comprehensive MRSA colonization surveillance program. It is not intended to diagnose MRSA infection nor to guide or monitor treatment for MRSA infections.     Coagulation Studies: No results for input(s): LABPROT, INR in the last 72 hours.  Imaging: No results found.     Assessment and plan per attending neurologist  Felicie Morn PA-C [facility] [phone]  03/03/2017, 3:33 PM  I had a long discussion with the patient  Assessment/Plan: 56 year old male with intractable seizures due to previous aneurysm rupture.  From his description, I am not certain  whether the episodes that he has been having have been more due to atrial flutter versus actual seizures.  If he continues to have the episodes, then we can certainly go up on his Vimpat.   1) continue Vimpat 100 mg twice daily 2) continue Keppra 1500 mg twice daily 3) I will continue to follow  Ritta Slot, MD [facility] [phone]  If 7pm- 7am, please page neurology on call as listed in AMION.

## 2017-03-03 NOTE — Progress Notes (Signed)
Reviewed with Dr Sandrea HammondMK from neuro  Will stop apixoban and CCB and BB 2/2 sinus brady now that aflutter is gone  Home in am

## 2017-03-03 NOTE — Progress Notes (Signed)
Site area: left groin fv sheaths x2 Site Prior to Removal:  Level 0 Pressure Applied For:  20 minutes Manual:   yes Patient Status During Pull: stable  Post Pull Site:  Level  0 Post Pull Instructions Given:  yes Post Pull Pulses Present: palpable Dressing Applied:  Gauze and tegaderm Bedrest begins @  Comments:

## 2017-03-03 NOTE — Anesthesia Preprocedure Evaluation (Addendum)
Anesthesia Evaluation   Patient awake    Reviewed: Allergy & Precautions, NPO status , Patient's Chart, lab work & pertinent test results  History of Anesthesia Complications (+) Emergence DeliriumNegative for: history of anesthetic complications  Airway Mallampati: I  TM Distance: >3 FB Neck ROM: Full    Dental  (+) Poor Dentition, Dental Advisory Given, Missing   Pulmonary Current Smoker,    breath sounds clear to auscultation       Cardiovascular hypertension,  Rhythm:Irregular Rate:Normal     Neuro/Psych Seizures -,  Bipolar Disorder Hx of brain aneurysm  Neuromuscular disease    GI/Hepatic (+) Hepatitis -, C  Endo/Other  negative endocrine ROS  Renal/GU negative Renal ROS     Musculoskeletal   Abdominal   Peds  Hematology negative hematology ROS (+)   Anesthesia Other Findings   Reproductive/Obstetrics                            Anesthesia Physical Anesthesia Plan  ASA: III  Anesthesia Plan: General   Post-op Pain Management:    Induction: Intravenous  PONV Risk Score and Plan: 3 and Treatment may vary due to age or medical condition, Dexamethasone and Ondansetron  Airway Management Planned: Oral ETT  Additional Equipment:   Intra-op Plan:   Post-operative Plan: Extubation in OR  Informed Consent: I have reviewed the patients History and Physical, chart, labs and discussed the procedure including the risks, benefits and alternatives for the proposed anesthesia with the patient or authorized representative who has indicated his/her understanding and acceptance.   Dental advisory given  Plan Discussed with: CRNA  Anesthesia Plan Comments:         Anesthesia Quick Evaluation

## 2017-03-03 NOTE — Anesthesia Procedure Notes (Signed)
Procedure Name: Intubation Date/Time: 03/03/2017 1:07 PM Performed by: Bryson Corona, CRNA Pre-anesthesia Checklist: Patient identified, Emergency Drugs available, Suction available and Patient being monitored Patient Re-evaluated:Patient Re-evaluated prior to induction Oxygen Delivery Method: Circle System Utilized Preoxygenation: Pre-oxygenation with 100% oxygen Induction Type: IV induction Ventilation: Two handed mask ventilation required Laryngoscope Size: Mac and 4 Grade View: Grade I Tube type: Oral Tube size: 7.0 mm Number of attempts: 1 Airway Equipment and Method: Stylet and Oral airway Placement Confirmation: ETT inserted through vocal cords under direct vision,  positive ETCO2 and breath sounds checked- equal and bilateral Secured at: 23 cm Tube secured with: Tape Dental Injury: Teeth and Oropharynx as per pre-operative assessment

## 2017-03-03 NOTE — Interval H&P Note (Signed)
History and Physical Interval Note:  03/03/2017 12:21 PM  [name] [name]  has presented today for surgery, with the diagnosis of Atrial Flutter  The various methods of treatment have been discussed with the patient and family. After consideration of risks, benefits and other options for treatment, the patient has consented to  Procedure(s): A-FLUTTER ABLATION (N/A) as a surgical intervention .  The patient's history has been reviewed, patient examined, no change in status, stable for surgery.  I have reviewed the patient's chart and labs.  Questions were answered to the patient's satisfaction.     Sherryl MangesSteven Delontae Lamm

## 2017-03-03 NOTE — Transfer of Care (Signed)
Immediate Anesthesia Transfer of Care Note  Patient: [name] [name]  Procedure(s) Performed: A-FLUTTER ABLATION (N/A )  Patient Location: Cath Lab  Anesthesia Type:General  Level of Consciousness: drowsy  Airway & Oxygen Therapy: Patient Spontanous Breathing and Patient connected to face mask oxygen  Post-op Assessment: Report given to RN and Post -op Vital signs reviewed and stable  Post vital signs: Reviewed and stable  Last Vitals:  Vitals:   03/03/17 1112 03/03/17 1447  BP: 140/77   Pulse: 64   Temp: 36.9 C (!) 36.1 C  SpO2: 96%     Last Pain:  Vitals:   03/03/17 1447  TempSrc: Temporal         Complications: No apparent anesthesia complications

## 2017-03-03 NOTE — Progress Notes (Signed)
Site area: rt groin fv sheaths x2 pulled  And pressure held by Gannett CoKatelynn McAtee Site Prior to Removal:  Level 0 Pressure Applied For:  20 minutes Manual:   yes Patient Status During Pull:  stable Post Pull Site:  Level  0 Post Pull Instructions Given:  yes Post Pull Pulses Present: palpable Dressing Applied:  Gauze and tegaderm Bedrest begins @ 1530 Comments:

## 2017-03-04 ENCOUNTER — Other Ambulatory Visit: Payer: Self-pay

## 2017-03-04 DIAGNOSIS — I10 Essential (primary) hypertension: Secondary | ICD-10-CM | POA: Diagnosis not present

## 2017-03-04 DIAGNOSIS — B1920 Unspecified viral hepatitis C without hepatic coma: Secondary | ICD-10-CM | POA: Diagnosis not present

## 2017-03-04 DIAGNOSIS — G40909 Epilepsy, unspecified, not intractable, without status epilepticus: Secondary | ICD-10-CM | POA: Diagnosis not present

## 2017-03-04 DIAGNOSIS — I4892 Unspecified atrial flutter: Secondary | ICD-10-CM | POA: Diagnosis not present

## 2017-03-04 MED ORDER — ONDANSETRON HCL 4 MG/2ML IJ SOLN
4.0000 mg | Freq: Four times a day (QID) | INTRAMUSCULAR | Status: DC | PRN
  Administered 2017-03-04: 4 mg via INTRAVENOUS
  Filled 2017-03-04: qty 2

## 2017-03-04 NOTE — Discharge Summary (Addendum)
Discharge Summary    Patient ID: [name] [name],  MRN: [id number], DOB/AGE: December 13, 1960 56 y.o.  Admit date: 03/03/2017 Discharge date: 03/04/2017  Primary Care Provider: Mortimer Friesurl, David Primary Cardiologist: Dr. Graciela HusbandsKlein   Discharge Diagnoses    Active Problems:   Paranoia [facility](HCC)   Atrial flutter with rapid ventricular response (HCC)   Bipolar disorder (HCC)   Atrial flutter (HCC)   Allergies Allergies  Allergen Reactions  . Carbamazepine Other (See Comments)    Hyponatremia  . Depakote [Divalproex Sodium] Other (See Comments)    [facility]    Diagnostic Studies/Procedures    Technical Details [name] AlexanderFennell Avallone [id number] @CSN @  Preop Dx: Atrial flutter  Postop Dx same/   Procedure: EPS And RF catheter ablation  Cx: None    Dictation number [id number]    Sherryl MangesSteven Klein, MD 03/03/2017 2:53 PM      History of Present Illness     56 y.o. male with bipolar, EtOH abuse, Dementia, Seizure disorder, Hep C, HTN, Hereditary and idiopathic peripheral neuropathy, and a cerebral [facility] who recently admitted 01/2017 following a syncopal episode at his [name]/group home. His heart rate was 200 when EMSarrived. He was given adenosine twice with no results. In ED given cardizem bolus and drip and rate dropped to 110 range and noted to be aflutter. Started on Eliquis. Inable to perform TEE (couldn't pass probe), ablation was canceled. Plan made to ablation after 4 weeks of anticoagulation.   Hospital Course     Consultants: Neurology  The patient underwent successful atrial flutter ablation. Maintained sinus rhythm overnight. Neurology consulted for increased number of seizure prior to ablation. Hx was limited due to anesthesia. Plan to continue Vimpat 100 mg twice daily and Keppra 1500 mg twice daily. F/u with neurology as outpatient closely. Stop Eliquis, CCB and BB due to bradycardia and no aflutter. Stop Tramadol per neurology.   The  patient has been seen by Dr. today and deemed ready for discharge home. All follow-up appointments have been scheduled. Discharge medications are listed below.    Discharge Vitals Blood pressure (!) 124/54, pulse 66, temperature 98.2 F (36.8 C), temperature source Oral, resp. rate 16, height 6\' 5"  (1.956 m), weight 226 lb (102.5 kg), SpO2 92 %.  Filed Weights   03/03/17 1112  Weight: 226 lb (102.5 kg)   Physical EXAM   Labs & Radiologic Studies     CBC No results for input(s): WBC, NEUTROABS, HGB, HCT, MCV, PLT in the last 72 hours. Basic Metabolic Panel Recent Labs    44/05/4710/14/18 1649  NA 138  K 4.2  CL 108  CO2 22  GLUCOSE 122*  BUN 15  CREATININE 0.73  CALCIUM 8.7*   Liver Function Tests Recent Labs    03/03/17 1649  AST 31  ALT 24  ALKPHOS 70  BILITOT 0.8  PROT 7.6  ALBUMIN 3.7     Disposition   Pt is being discharged home today in good condition.  Follow-up Plans & Appointments    Follow-up Information    Sheilah PigeonUrsuy, Renee Lynn, PA-C. Go on 04/03/2017.   Specialty:  Cardiology Why:  @1pm  for post hospital  Contact information: [address] [place] [address] [phone]          Discharge Instructions    Ambulatory referral to Neurology   Complete by:  As directed    An appointment is requested in approximately: 4 weeks   Diet - low  sodium heart healthy   Complete by:  As directed    Increase activity slowly   Complete by:  As directed       Discharge Medications   Allergies as of 03/04/2017      Reactions   Carbamazepine Other (See Comments)   Hyponatremia   Depakote [divalproex Sodium] Other (See Comments)   Unknown - on MAR      Medication List    STOP taking these medications   apixaban 5 MG Tabs tablet Commonly known as:  ELIQUIS   diltiazem 180 MG 24 hr capsule Commonly known as:  CARDIZEM CD   metoprolol tartrate 25 MG tablet Commonly known as:  LOPRESSOR   traMADol 50 MG tablet Commonly known as:   ULTRAM     TAKE these medications   acetaminophen 325 MG tablet Commonly known as:  TYLENOL Take 650 mg by mouth See admin instructions. Take 2 tablets (650 mg) by mouth three times daily, may also take 2 tablets (650 mg) every 6 hours as needed for mild pain, fever or headache   Lacosamide 100 MG Tabs Commonly known as:  VIMPAT Take 1 tablet (100 mg total) by mouth 2 (two) times daily.   latanoprost 0.005 % ophthalmic solution Commonly known as:  XALATAN Place 1 drop into both eyes at bedtime.   levETIRAcetam 750 MG tablet Commonly known as:  KEPPRA Take 1,500 mg by mouth 2 (two) times daily.   Melatonin 3 MG Tabs Take 3 mg by mouth at bedtime.   QUEtiapine 50 MG tablet Commonly known as:  SEROQUEL Take 50 mg by mouth daily.   sertraline 100 MG tablet Commonly known as:  ZOLOFT Take 100 mg by mouth daily.   SUMAtriptan 50 MG tablet Commonly known as:  IMITREX Take 50 mg by mouth See admin instructions. Give 1 tablet (50 mg) by mouth every 24 hours as needed for migraine headache;  May repeat in 2 hours if headache persists or recurs. Do not exceed 100 mg in 24 hours   Vitamin D3 1000 units Caps Take 1,000 Units by mouth daily.   zolpidem 10 MG tablet Commonly known as:  AMBIEN Take 1 tablet (10 mg total) by mouth at bedtime.        Outstanding Labs/Studies   None  Duration of Discharge Encounter   Greater than 30 minutes including physician time.  Signed, Sharrell KuBhavinkumar Bhagat PA-C 03/04/2017, 10:12 AM  I have seen and examined this patient with Manson PasseyBhagat Bhavinkumar.  Agree with above, note added to reflect my findings.  On exam, RRR, no murmurs, lungs clear. Patient had atrial flutter ablation yesterday. Tolerated well without issues. Plan for discharge today with follow up in clinic.    Malijah Lietz M. Briya Lookabaugh MD 03/04/2017 10:12 AM

## 2017-03-04 NOTE — Op Note (Signed)
NAMMarland Kitchen:  Omelia BlackwaterLEXANDER, Calvyn           ACCOUNT NO.:  [id number]  MEDICAL RECORD NO.:  [id number]  LOCATION:                               FACILITY:  [facility]  PHYSICIAN:  Duke SalviaSteven C. Keslee Harrington, MD, FACCDATE OF BIRTH:  12-Jan-1961  DATE OF PROCEDURE:  03/03/2017 DATE OF DISCHARGE:  03/04/2017                              OPERATIVE REPORT   PREOPERATIVE DIAGNOSIS:  Atrial flutter.  POSTOPERATIVE DIAGNOSIS:  Atrial flutter.  PROCEDURE PERFORMED:  Invasive electrophysiological study, electrogram mapping, and RF catheter ablation.  DESCRIPTION OF PROCEDURE:  Following obtaining informed consent, the patient was brought to Electrophysiology Laboratory and placed on the fluoroscopic table in supine position.  He was submitted to general anesthesia under the care of Dr. Jacklynn BueMassagee.  After the procedure, the patient was transferred to the holding area for sheath removal in stable condition.  CATHETERS: 1. A 5-French quadripolar catheter was inserted via the left femoral     vein to the AV junction. 2. A 6-French octapolar catheter was inserted via the right femoral     vein to the coronary sinus. 3. A 7-French dual decapolar catheter was inserted via the left     femoral vein to the tricuspid annulus. 4. An 8-French 10 mm deflectable tip ablation catheter was inserted     via the right femoral vein to mapping sites in the posterior septal     space.  SURFACE LEADS:  I, aVF, and V1 were monitored continuously throughout the procedure.  Following insertion of the catheters, a stimulation protocol included incremental atrial pacing, incremental ventricular pacing, and single atrial extrastimuli at paced cycle length of 600 milliseconds.  RESULTS:  End-tidal surface electrocardiogram and intracardiac intervals:  Initial: Rhythm:  Sinus; RR interval 1004 milliseconds; PR interval 173 milliseconds; P-wave duration 138 milliseconds; QRS duration 93 milliseconds; QT interval 473 milliseconds; AH  interval 110 milliseconds; HV interval 40 milliseconds.  Final: Rhythm:  Sinus; RR interval 1175 milliseconds; PR interval 167 milliseconds; P-wave duration 126 milliseconds; QRS duration 95 milliseconds; QT interval 506 milliseconds; AH interval was not recorded; HV interval was normal.  AV nodal function: 1. AV Wenckebach was 400 milliseconds. 2. VA conduction was dissociated at 600 milliseconds. 3. AV nodal effective refractory period at 600 milliseconds was 360     milliseconds. 4. AV nodal conduction failed to demonstrate dual AV nodal physiology. Accessory pathway function:  No evidence of accessory pathway was identified.  Ventricular response programmed stimulation was normal for ventricular stimulation as described.  RADIOFREQUENCY ENERGY:  A total of 4.3 minutes of RF energy was applied across the cavotricuspid isthmus resulting in interruption of bidirectional cavotricuspid isthmus conduction and eliminating the substrate for the patient's atrial flutter.  FLUOROSCOPY TIME:  A total of 5.8 minutes of fluoroscopy time was utilized.  IMPRESSION: 1. Sinus bradycardia-likely iatrogenic. 2. Abnormal atrial function manifested by prior atrial flutter. 3. Normal AV nodal function. 4. Normal His-Purkinje system function. 5. No accessory pathway. 6. Normal ventricular response programmed stimulation.  SUMMARY:  In conclusion, results of electrophysiological testing demonstrated cavotricuspid isthmus conduction that presumed substrate for the patient's clinical typical atrial flutter.  RF energy across the cavotricuspid isthmus interrupted bidirectional cavotricuspid isthmus conduction and limited the patient's substrate.  We will discontinue the patient's anticoagulation, beta-blockers, and calcium blockers.  The patient tolerated the procedure without apparent complication.     Duke SalviaSteven C. Jolee Critcher, MD, FACC   ______________________________ Duke SalviaSteven C. Sreeja Spies, MD,  [facility] HospitalFACC    SCK/MEDQ  D:  03/03/2017  T:  03/04/2017  Job:  [id number]

## 2017-03-04 NOTE — Clinical Social Work Note (Signed)
Clinical Social Work Assessment  Patient Details  Name: [name] [name] MRN: [id number] Date of Birth: Dec 31, 1960  Date of referral:  03/04/17               Reason for consult:  Facility Placement                Permission sought to share information with:  Family Supports([name] [name] (Son) no number listed for son. Patient does not have son's phone number.  Patient does not want anyone else contacted) Permission granted to share information::  Yes, Verbal Permission Granted  Name::     [name] [name]  Agency::  [facility]  Relationship::  (son)  Contact Information:  Yes  Housing/Transportation Living arrangements for the past 2 months:  Skilled Nursing Facility Source of Information:  Patient Patient Interpreter Needed:  None Criminal Activity/Legal Involvement Pertinent to Current Situation/Hospitalization:  No - Comment as needed Significant Relationships:  Adult Children([name] [name]) Lives with:  Facility Resident Do you feel safe going back to the place where you live?  Yes Need for family participation in patient care:  No (Coment)  Care giving concerns: NA   Social Worker assessment / plan:  [facility]  Employment status:  Retired Health and safety inspectornsurance information:  [place] Recommendations:  Not assessed at this time Information / Referral to community resources:  (NA)  Patient/Family's Response to care:  Not able to contact son. Patient does not have phone number.  Patient/Family's Understanding of and Emotional Response to Diagnosis, Current Treatment, and Prognosis:  Patient is aware of his care.  Emotional Assessment Appearance:  Appears older than stated age Attitude/Demeanor/Rapport:  Reactive, Complaining Affect (typically observed):  Accepting, Irritable, Adaptable, Appropriate Orientation:  Oriented to Self, Oriented to Place, Oriented to  Time, Oriented to Situation Alcohol / Substance use:  Never Used Psych involvement  (Current and /or in the community):  No (Comment)  Discharge Needs  Concerns to be addressed:  Basic Needs Readmission within the last 30 days:  No Current discharge risk:  None Barriers to Discharge:  No Barriers Identified   [name] [name], LCSWA 03/04/2017, 10:35 AM

## 2017-03-04 NOTE — Op Note (Deleted)
  The note originally documented on this encounter has been moved the the encounter in which it belongs.  

## 2017-03-04 NOTE — Progress Notes (Signed)
Patient will Discharge To: [facility] Anticipated DC Date: 03-04-17 Family Notified: Phone number not listed for son. Pt does not want anyone else contacted Transport By: Sharin MonsPTAR   Per MD [facility] . RN, patient, patient's family (not able to contact), and facility notified of DC. Assessment, and Discharge Summary sent to facility. RN given number for report [phone]). DC packet on chart. Ambulance transport requested for patient.   CSW signing off.  Budd Palmerara Latash Nouri LCSWA [phone]

## 2017-03-06 ENCOUNTER — Encounter (HOSPITAL_COMMUNITY): Payer: Self-pay | Admitting: Internal Medicine

## 2017-03-06 ENCOUNTER — Non-Acute Institutional Stay (SKILLED_NURSING_FACILITY): Payer: Medicaid Other | Admitting: Adult Health

## 2017-03-06 DIAGNOSIS — F01518 Vascular dementia, unspecified severity, with other behavioral disturbance: Secondary | ICD-10-CM

## 2017-03-06 DIAGNOSIS — F22 Delusional disorders: Secondary | ICD-10-CM

## 2017-03-06 DIAGNOSIS — I4892 Unspecified atrial flutter: Secondary | ICD-10-CM

## 2017-03-06 DIAGNOSIS — I10 Essential (primary) hypertension: Secondary | ICD-10-CM | POA: Diagnosis not present

## 2017-03-06 DIAGNOSIS — F3162 Bipolar disorder, current episode mixed, moderate: Secondary | ICD-10-CM | POA: Diagnosis not present

## 2017-03-06 DIAGNOSIS — I6930 Unspecified sequelae of cerebral infarction: Secondary | ICD-10-CM

## 2017-03-06 DIAGNOSIS — R569 Unspecified convulsions: Secondary | ICD-10-CM

## 2017-03-06 DIAGNOSIS — G43009 Migraine without aura, not intractable, without status migrainosus: Secondary | ICD-10-CM | POA: Diagnosis not present

## 2017-03-06 DIAGNOSIS — F0151 Vascular dementia with behavioral disturbance: Secondary | ICD-10-CM

## 2017-03-06 NOTE — Anesthesia Postprocedure Evaluation (Signed)
Anesthesia Post Note  Patient: [name] [name]  Procedure(s) Performed: A-FLUTTER ABLATION (N/A )     Patient location during evaluation: PACU Anesthesia Type: General Level of consciousness: awake and alert Pain management: pain level controlled Vital Signs Assessment: post-procedure vital signs reviewed and stable Respiratory status: spontaneous breathing, nonlabored ventilation, respiratory function stable and patient connected to nasal cannula oxygen Cardiovascular status: blood pressure returned to baseline and stable Postop Assessment: no apparent nausea or vomiting Anesthetic complications: no    Last Vitals:  Vitals:   03/03/17 2022 03/04/17 0742  BP: (!) 160/85 (!) 124/54  Pulse: 89 66  Resp: 18 16  Temp: 36.6 C 36.8 C  SpO2: 96% 92%    Last Pain:  Vitals:   03/04/17 1049  TempSrc:   PainSc: 0-No pain                 [place] Straus,JAMES TERRILL

## 2017-03-06 NOTE — Progress Notes (Signed)
Location:   [facility] Room Number: 102 B Place of Service:  SNF (31)   CODE STATUS: Full Code  Allergies  Allergen Reactions  . Carbamazepine Other (See Comments)    Hyponatremia  . Depakote [Divalproex Sodium] Other (See Comments)    Unknown - on [facility]    Chief Complaint  Patient presents with  . Hospitalization Follow-up    Hospital Follow up    HPI:  He is a 56 year old long term resident of this facility who has been hospitalized for has atrial flutter. He did have a A flutter ablation on 03-03-17. This was successful; he had his lopressor; cardizem and eliquis stopped. He denies any chest pain shortness of breath and no palpitations. There are no nursing concerns at this time. His chronic illnesses will monitored including: hypertension; chronic liver disease; dementia.   Past Medical History:  Diagnosis Date  . Aneurysm (HCC)   . Atrial fibrillation with RVR (HCC) 03/03/2017  . ETOHism (HCC)   . Gait abnormality 05/26/2016  . Hepatitis C   . 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 Hypertension   . Seizure [facility])     Past Surgical History:  Procedure Laterality Date  . A-FLUTTER ABLATION N/A 03/03/2017   Procedure: A-FLUTTER ABLATION;  Surgeon: Duke Salvia, MD;  Location: [facility] INVASIVE CV LAB;  Service: Cardiovascular;  Laterality: N/A;  . ATRIAL FLUTTER ABLATION  03/03/2017  . CEREBRAL ANEURYSM REPAIR     At [facility]  . HARDWARE REMOVAL Left 10/29/2012   Procedure: HARDWARE REMOVAL;  Surgeon: Sheral Apley, MD;  Location: [facility] OR;  Service: Orthopedics;  Laterality: Left;  . I&D EXTREMITY Left 10/26/2012   Procedure: IRRIGATION AND DEBRIDEMENT Left Elbow;  Surgeon: Sheral Apley, MD;  Location: [facility] OR;  Service: Orthopedics;  Laterality: Left;  . I&D EXTREMITY Left 10/29/2012   Procedure: IRRIGATION AND DEBRIDEMENT EXTREMITY, wound vac change, stimulan beads;  Surgeon: Sheral Apley, MD;  Location: [facility] OR;  Service:  Orthopedics;  Laterality: Left;  lateral on bean bag  . I&D EXTREMITY Left 11/02/2012   Procedure: IRRIGATION AND DEBRIDEMENT EXTREMITY;  Surgeon: Sheral Apley, MD;  Location: [facility] OR;  Service: Orthopedics;  Laterality: Left;  . INCISION AND DRAINAGE ABSCESS Left 11/02/2012   Procedure: INCISION AND DRAINAGE ABSCESS;  Surgeon: Sheral Apley, MD;  Location: [facility] OR;  Service: Orthopedics;  Laterality: Left;    Social History   Socioeconomic History  . Marital status: Single    Spouse name: Not on file  . Number of children: 3  . Years of education: 80 th  . Highest education level: Not on file  Social Needs  . Financial resource strain: Not on file  . Food insecurity - worry: Not on file  . Food insecurity - inability: Not on file  . Transportation needs - medical: Not on file  . Transportation needs - non-medical: Not on file  Occupational History  . Occupation: disabled  Tobacco Use  . Smoking status: Current Every Day Smoker    Packs/day: 0.25    Types: Cigarettes  . Smokeless tobacco: Never Used  Substance and Sexual Activity  . Alcohol use: No    Alcohol/week: 0.0 oz    Comment: quit drinking 11/2011 per patient  . Drug use: No  . Sexual activity: Not Currently  Other Topics Concern  . Not on file  Social History Narrative  . Not on file   Family History  Problem Relation Age  of Onset  . Hypertension Mother   . Cancer Mother   . Hypertension Father   . Diabetes Sister   . Cancer Sister   . Cancer Sister       VITAL SIGNS BP 121/62   Pulse 63   Temp (!) 97.5 F (36.4 C)   Resp 18   Ht 6\' 6"  (1.981 m)   Wt 237 lb 3.2 oz (107.6 kg)   SpO2 98%   BMI 27.41 kg/m   Outpatient Encounter Medications as of 03/06/2017  Medication Sig  . acetaminophen (TYLENOL) 325 MG tablet Take 650 mg by mouth See admin instructions. Take 2 tablets (650 mg) by mouth three times daily, may also take 2 tablets (650 mg) every 6 hours as needed for mild pain, fever or headache    . Cholecalciferol (VITAMIN D3) 1000 units CAPS Take 1,000 Units by mouth daily.  . Lacosamide (VIMPAT) 100 MG TABS Take 1 tablet (100 mg total) by mouth 2 (two) times daily.  Marland Kitchen. latanoprost (XALATAN) 0.005 % ophthalmic solution Place 1 drop into both eyes at bedtime.  . levETIRAcetam (KEPPRA) 750 MG tablet Take 1,500 mg by mouth 2 (two) times daily.   . Melatonin 3 MG TABS Take 3 mg by mouth at bedtime.  Marland Kitchen. QUEtiapine (SEROQUEL) 50 MG tablet Take 50 mg by mouth daily.   . sertraline (ZOLOFT) 100 MG tablet Take 100 mg by mouth daily.  . SUMAtriptan (IMITREX) 50 MG tablet Take 50 mg by mouth See admin instructions. Give 1 tablet (50 mg) by mouth every 24 hours as needed for migraine headache;  May repeat in 2 hours if headache persists or recurs. Do not exceed 100 mg in 24 hours  . traMADol (ULTRAM) 50 MG tablet Take 50 mg by mouth every 6 (six) hours as needed.  . zolpidem (AMBIEN) 10 MG tablet Take 1 tablet (10 mg total) by mouth at bedtime.   No facility-administered encounter medications on file as of 03/06/2017.      SIGNIFICANT DIAGNOSTIC EXAMS  PREVIOUS:   01-10-17: chest x-ray:Stable cardiac prominence. Aortic atherosclerosis. No edema or consolidation. Aortic Atherosclerosis   01-10-17: ct of head and cervical spine:  CT head: Stable moderate diffuse atrophy, slightly more pronounced involving the cerebellum than elsewhere. 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 No acute infarct. There is patchy periventricular small vessel disease. No mass, hemorrhage, or extra-axial fluid collection. Areas present to the right of the sella, chronic and stable. There appears to be a connection between the posterior sphenoid sinus in this area, possibly residua of old trauma. There are areas of prior nasal trauma and trauma involving the medial left orbital wall. There are foci of paranasal sinus disease currently. There are foci of arterial vascular  calcification.  CT cervical spine: No fracture or spondylolisthesis. Multilevel osteoarthritic change present. There are foci of calcification in both carotid and subclavian arteries.  01-11-17:2- d echo:   - Left ventricle: The cavity size was normal. Wall thickness was increased in a pattern of moderate LVH. Systolic function was  normal. The estimated ejection fraction was in the range of 55% to 60%. Wall motion was normal; there were no regional wall motion abnormalities. The study is not technically sufficient to allow evaluation of LV diastolic function. - Mitral valve: There was mild regurgitation. - Right ventricle: The cavity size was mildly dilated. - Right atrium: The atrium was mildly dilated.  NO NEW EXAMS   LABS REVIEWED:  PREVIOUS:   01-10-17: wbc 13.1; hgb 14.5; hct 41.3; mcv 97.9; plt 300;glcuose 125; bun 14; creat 0.97; k+ 4.0; na++ 137; ca 8.6; liver normal albumin 3.2; tsh 1.506 01-11-17: keppra 25.4; drug screen: + opiates 01-12-17: wbc 8.2; hgb 13.1; hct 38.2; mcv 97.7; plt 274; mag 1.9 01-16-17: wbc 7.8; hgb 13.5; hct 39.3;mcv 97.5; plt 218; glucose 104; bun 11; creat 0.74; k+ 4.0; na++ 138; ca 8.6; liver normal albumin 2.9 01-18-17: wbc 8.1; hgb 13.8; hct 39.6; mcv 98.3; plt 208   TODAY:   02-16-17: wbc 8.2; hgb 14.3; hct 42.9; mcv 98; plt 176; glucose 97; bun 19; creat 0.92; k+ 5.3; na++ 147; ca 9.4 03-03-17: glucose 122; bun 15; creat 0.73; k+ 4.2; na++ 138; ca 8.7; liver normal albumin 3.7; ammonia 40     Review of Systems  Constitutional: Negative for malaise/fatigue.  Respiratory: Negative for cough and shortness of breath.   Cardiovascular: Negative for chest pain, palpitations and leg swelling.  Gastrointestinal: Negative for abdominal pain, constipation and heartburn.  Musculoskeletal: Negative for back pain, joint pain and myalgias.  Skin: Negative.   Neurological: Negative for dizziness.  Psychiatric/Behavioral: The patient is not nervous/anxious.       Physical Exam  Constitutional: He is oriented to person, place, and time. He appears well-developed and well-nourished. No distress.  Neck: Neck supple. No thyromegaly present.  Cardiovascular: Normal rate, regular rhythm and intact distal pulses.  Murmur heard. 1/6  Pulmonary/Chest: Effort normal and breath sounds normal. No respiratory distress.  Abdominal: Soft. Bowel sounds are normal. He exhibits no distension. There is no tenderness.  Musculoskeletal: Normal range of motion. He exhibits no edema.  Lymphadenopathy:    He has no cervical adenopathy.  Neurological: He is alert and oriented to person, place, and time.  Skin: Skin is warm and dry. He is not diaphoretic.  Psychiatric: He has a normal mood and affect.     ASSESSMENT/ PLAN:  TODAY:   1. Atrial flutter with RVR:  Heart rate stable: is status post failure DVVC: is status post A-flutter ablation his eliquis was stopped.   2. Benign essential hypertension: b/p121/62: is stable is not on medications; will not make changes will monitor  His lopressor and cardizem was stopped in the hospital   3. Chronic hepatitis C without hepatic coma: ( has chronic liver disease) no change in status: will continue  4. Migraine headache (is status post cerebral aneurysm repair) stable will continue  imitrex 50  mg daily as needed may repeat in 2 hours as needed no more than 100 mg in 24 hours  5. Seizures: no reports of further seizures:  Will continue vimpat 100 mg twice daily and keppra 1500 mg twice daily   6. Bipolar disorder: no change in status: will continue zoloft 100 mg daily; seroquel 50 mg daily   7. Insomnia: stable will continue ambien 10 mg nightly and melatonin 3 mg nightly    8.  Chronic pain syndrome: chronic chest pain: no change in status: will continue ultram 50 mg every 6 hours as needed for pain   9.  Vascular dementia; no change in status: weight is 237 pounds; will not make changes at this time.   10.  CVA: previous (bilateral frontal lobes): no change is status will monitor     MD is aware of resident's narcotic use and is in agreement with current plan of care. We will attempt to wean resident as apropriate     Synthia Innocenteborah Daaiel Starlin NP [facility]  Adult Medicine  Contact [phone] Monday through Friday 8am- 5pm  After hours call [phone]

## 2017-03-07 ENCOUNTER — Other Ambulatory Visit: Payer: Self-pay

## 2017-03-07 MED ORDER — TRAMADOL HCL 50 MG PO TABS: 50.0000 mg | ORAL_TABLET | Freq: Four times a day (QID) | ORAL | 0 refills | Status: DC | PRN

## 2017-03-07 NOTE — Telephone Encounter (Signed)
RX faxed to [facility] @ [phone], phone number [phone] 

## 2017-03-09 ENCOUNTER — Encounter: Payer: Self-pay | Admitting: Internal Medicine

## 2017-03-09 ENCOUNTER — Non-Acute Institutional Stay (SKILLED_NURSING_FACILITY): Payer: Medicaid Other | Admitting: Internal Medicine

## 2017-03-09 DIAGNOSIS — F01518 Vascular dementia, unspecified severity, with other behavioral disturbance: Secondary | ICD-10-CM

## 2017-03-09 DIAGNOSIS — F0151 Vascular dementia with behavioral disturbance: Secondary | ICD-10-CM | POA: Diagnosis not present

## 2017-03-09 DIAGNOSIS — R569 Unspecified convulsions: Secondary | ICD-10-CM

## 2017-03-09 DIAGNOSIS — Z79899 Other long term (current) drug therapy: Secondary | ICD-10-CM | POA: Diagnosis not present

## 2017-03-09 DIAGNOSIS — I4892 Unspecified atrial flutter: Secondary | ICD-10-CM

## 2017-03-09 DIAGNOSIS — F3162 Bipolar disorder, current episode mixed, moderate: Secondary | ICD-10-CM

## 2017-03-09 DIAGNOSIS — F22 Delusional disorders: Secondary | ICD-10-CM

## 2017-03-09 DIAGNOSIS — I10 Essential (primary) hypertension: Secondary | ICD-10-CM | POA: Diagnosis not present

## 2017-03-09 NOTE — Progress Notes (Signed)
Patient ID: [name] [name], male   DOB: [birth year], 56 y.o.   MRN: [id number]  Provider:  DR Elmon Kirschner Location:  [facility] Room Number: 120 B Place of Service:  SNF (31)  PCP: Mortimer Fries, PA Patient Care Team: Mortimer Fries, PA as PCP - General (Internal Medicine) York Spaniel, MD as Consulting Physician (Neurology)  Extended Emergency Contact Information Primary Emergency Contact: Izora Gala [place] Work Phone: [phone] Mobile Phone: [phone] Relation: Other Secondary Emergency Contact: Dolly Rias [place] Home Phone: [phone] Mobile Phone: [phone] Relation: Other  Code Status: FULL CODE Goals of Care: Advanced Directive information Advanced Directives 03/09/2017  Does Patient Have a Medical Advance Directive? No  Type of Advance Directive -  Copy of Healthcare Power of Attorney in Chart? -  Would patient like information on creating a medical advance directive? No - Patient declined  Pre-existing out of facility DNR order (yellow form or pink MOST form) -      Chief Complaint  Patient presents with  . Readmit To SNF    Readmit to [facility]    HPI: Patient is a 56 y.o. male seen today for re-admission to SNF following hospital stay for atrial flutter with RVR, bipolar d/o, paranoia. He underwent successful ablation for atrial flutter. He was seen by neurology for increased sz frequency prior to ablation. Eliquis, CCB and BB held due to bradycardia. sz tx with vimpat and keppra. Nh3 level 40; albumin 3.7;  Na 138 at d/c. He presents to SNF for long term care.  Today he reports no palpitations, CP or SOB. He has chronic pain in his legs. He has dizziness. He reports sz yesterday and states generic sz not working. He is a poor historian due to dementia. Hx obtained from chart.  Atrial flutter  - rate controlled s/p ablation. off eliquis.   HTN - currently diet  controlled.  lopressor and cardizem was stopped in the hospital 2/2 bradycardia  Chronic hepatitis C without hepatic coma - he has chronic liver disease; no change in status  Migraine headache  - s/p cerebral aneurysm repair; takes imitrex 50  mg daily as needed and may repeat in 2 hours as needed no more than 100 mg in 24 hours  Seizure d/o - uncontrolled on vimpat 100 mg twice daily and keppra 1500 mg twice daily   Bipolar disorder - mood stable on zoloft 100 mg daily; seroquel 50 mg daily   Insomnia - stable on ambien 10 mg nightly and melatonin 3 mg nightly    Chronic pain syndrome - he has chronic chest pain. Stable on ultram 50 mg every 6 hours as needed for pain   Vascular dementia - stable without cognitive medication   Hx CVA of bilateral frontal lobes - unchanged. stable    Past Medical History:  Diagnosis Date  . Aneurysm (HCC)   . Atrial fibrillation with RVR (HCC) 03/03/2017  . ETOHism (HCC)   . Gait abnormality 05/26/2016  . Hepatitis C   . 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 Hypertension   . Seizure [facility])    Past Surgical History:  Procedure Laterality Date  . A-FLUTTER ABLATION N/A 03/03/2017   Procedure: A-FLUTTER ABLATION;  Surgeon: Duke Salvia, MD;  Location: [facility] INVASIVE CV LAB;  Service: Cardiovascular;  Laterality: N/A;  . ATRIAL FLUTTER ABLATION  03/03/2017  . CEREBRAL ANEURYSM REPAIR     At [facility]  . HARDWARE REMOVAL Left  10/29/2012   Procedure: HARDWARE REMOVAL;  Surgeon: Sheral Apleyimothy D Murphy, MD;  [facility];  Service: Orthopedics;  Laterality: Left;  . I&D EXTREMITY Left 10/26/2012   Procedure: IRRIGATION AND DEBRIDEMENT Left Elbow;  Surgeon: Sheral Apleyimothy D Murphy, MD;  Location: [facility] OR;  Service: Orthopedics;  Laterality: Left;  . I&D EXTREMITY Left 10/29/2012   Procedure: IRRIGATION AND DEBRIDEMENT EXTREMITY, wound vac change, stimulan beads;  Surgeon: Sheral Apleyimothy D Murphy, MD;  Location: [facility] OR;  Service: Orthopedics;   Laterality: Left;  lateral on bean bag  . I&D EXTREMITY Left 11/02/2012   Procedure: IRRIGATION AND DEBRIDEMENT EXTREMITY;  Surgeon: Sheral Apleyimothy D Murphy, MD;  Location: [facility] OR;  Service: Orthopedics;  Laterality: Left;  . INCISION AND DRAINAGE ABSCESS Left 11/02/2012   Procedure: INCISION AND DRAINAGE ABSCESS;  Surgeon: Sheral Apleyimothy D Murphy, MD;  Location: [facility] OR;  Service: Orthopedics;  Laterality: Left;    reports that he has been smoking cigarettes.  He has been smoking about 0.25 packs per day. he has never used smokeless tobacco. He reports that he does not drink alcohol or use drugs. Social History   Socioeconomic History  . Marital status: Single    Spouse name: Not on file  . Number of children: 3  . Years of education: 8711 th  . Highest education level: Not on file  Social Needs  . Financial resource strain: Not on file  . Food insecurity - worry: Not on file  . Food insecurity - inability: Not on file  . Transportation needs - medical: Not on file  . Transportation needs - non-medical: Not on file  Occupational History  . Occupation: disabled  Tobacco Use  . Smoking status: Current Every Day Smoker    Packs/day: 0.25    Types: Cigarettes  . Smokeless tobacco: Never Used  Substance and Sexual Activity  . Alcohol use: No    Alcohol/week: 0.0 oz    Comment: quit drinking 11/2011 per patient  . Drug use: No  . Sexual activity: Not Currently  Other Topics Concern  . Not on file  Social History Narrative  . Not on file    Functional Status Survey:    Family History  Problem Relation Age of Onset  . Hypertension Mother   . Cancer Mother   . Hypertension Father   . Diabetes Sister   . Cancer Sister   . Cancer Sister     Health Maintenance  Topic Date Due  . COLONOSCOPY  04/28/2017 (Originally 12/06/2008)  . INFLUENZA VACCINE  01/22/2018 (Originally 10/19/2016)  . TETANUS/TDAP  01/11/2027  . Hepatitis C Screening  Completed  . HIV Screening  Completed    Allergies    Allergen Reactions  . Carbamazepine Other (See Comments)    Hyponatremia  . Depakote [Divalproex Sodium] Other (See Comments)    [facility]    Outpatient Encounter Medications as of 03/09/2017  Medication Sig  . acetaminophen (TYLENOL) 325 MG tablet Take 650 mg by mouth See admin instructions. Take 2 tablets (650 mg) by mouth three times daily, may also take 2 tablets (650 mg) every 6 hours as needed for mild pain, fever or headache  . Cholecalciferol (VITAMIN D3) 1000 units CAPS Take 1,000 Units by mouth daily.  . Lacosamide (VIMPAT) 100 MG TABS Take 1 tablet (100 mg total) by mouth 2 (two) times daily.  Marland Kitchen. latanoprost (XALATAN) 0.005 % ophthalmic solution Place 1 drop into both eyes at bedtime.  . levETIRAcetam (KEPPRA) 750 MG tablet Take 1,500  mg by mouth 2 (two) times daily.   . Melatonin 3 MG TABS Take 3 mg by mouth at bedtime.  Marland Kitchen QUEtiapine (SEROQUEL) 50 MG tablet Take 50 mg by mouth daily.   . sertraline (ZOLOFT) 100 MG tablet Take 100 mg by mouth daily.  . SUMAtriptan (IMITREX) 50 MG tablet Take 50 mg by mouth See admin instructions. Give 1 tablet (50 mg) by mouth every 24 hours as needed for migraine headache;  May repeat in 2 hours if headache persists or recurs. Do not exceed 100 mg in 24 hours  . traMADol (ULTRAM) 50 MG tablet Take 1 tablet (50 mg total) by mouth every 6 (six) hours as needed.  . zolpidem (AMBIEN) 10 MG tablet Take 1 tablet (10 mg total) by mouth at bedtime.   No facility-administered encounter medications on file as of 03/09/2017.     Review of Systems  Unable to perform ROS: Dementia    Vitals:   03/09/17 0957  BP: 121/62  Pulse: 63  Resp: 18  Temp: (!) 97.5 F (36.4 C)  TempSrc: Oral  SpO2: 98%  Weight: 237 lb 3.2 oz (107.6 kg)  Height: 6\' 6"  (1.981 m)   Body mass index is 27.41 kg/m. Physical Exam  Constitutional: He appears well-developed and well-nourished.  Sitting on edge of bed in NAD  HENT:  Mouth/Throat: Oropharynx is clear  and moist.  MMM; no oral thrush  Eyes: Pupils are equal, round, and reactive to light. No scleral icterus.  Neck: Neck supple. Carotid bruit is not present. No thyromegaly present.  Cardiovascular: Normal rate, regular rhythm and intact distal pulses. Exam reveals no gallop and no friction rub.  Murmur (1/6 SEM) heard. No distal LE edema. No calf TTP  Pulmonary/Chest: Effort normal and breath sounds normal. He has no wheezes. He has no rales. He exhibits no tenderness.  Abdominal: Soft. Normal appearance and bowel sounds are normal. He exhibits no distension, no abdominal bruit, no pulsatile midline mass and no mass. There is no hepatomegaly. There is no tenderness. There is no rigidity, no rebound and no guarding. No hernia.  Obese  Musculoskeletal: He exhibits edema.  Lymphadenopathy:    He has no cervical adenopathy.  Neurological: He is alert.  Skin: Skin is warm and dry. No rash noted.  Psychiatric: He has a normal mood and affect. His behavior is normal. Thought content normal.    Labs reviewed: Basic Metabolic Panel: Recent Labs    01/12/17 0220  01/16/17 0214 02/16/17 1431 03/03/17 1649  NA  --    < > 138 147* 138  K  --    < > 4.0 5.3* 4.2  CL  --    < > 107 106 108  CO2  --    < > 23 23 22   GLUCOSE  --    < > 104* 97 122*  BUN  --    < > 11 19 15   CREATININE  --    < > 0.74 0.92 0.73  CALCIUM  --    < > 8.6* 9.4 8.7*  MG 1.9  --  1.7  --   --    < > = values in this interval not displayed.   Liver Function Tests: Recent Labs    01/10/17 0747 01/16/17 0214 03/03/17 1649  AST 25 29 31   ALT 16* 19 24  ALKPHOS 71 92 70  BILITOT 0.8 0.4 0.8  PROT 7.8 6.9 7.6  ALBUMIN 3.2* 2.9* 3.7   No  results for input(s): LIPASE, AMYLASE in the last 8760 hours. Recent Labs    05/26/16 1137 03/03/17 1649  AMMONIA 57 40*   CBC: Recent Labs    06/24/16 1132 01/10/17 0747  01/17/17 0207 01/18/17 0325 02/16/17 1431  WBC 4.8 13.1*   < > 7.5 8.1 8.2  NEUTROABS 1.4 5.8   --   --   --  2.7  HGB 14.8 14.5   < > 13.6 13.8 14.3  HCT 41.6 41.3   < > 39.6 39.6 42.9  MCV 96 97.9   < > 97.5 98.3 98*  PLT 138* 300   < > 219 208 176   < > = values in this interval not displayed.   Cardiac Enzymes: Recent Labs    01/11/17 0929 01/11/17 1451 01/11/17 2047  TROPONINI <0.03 <0.03 <0.03   BNP: Invalid input(s): POCBNP Lab Results  Component Value Date   HGBA1C 5.7 (H) 01/24/2014   Lab Results  Component Value Date   TSH 1.506 01/10/2017   Lab Results  Component Value Date   VITAMINB12 970 10/19/2015   Lab Results  Component Value Date   FOLATE 11.5 10/19/2015   Lab Results  Component Value Date   IRON 131 03/18/2014    Imaging and Procedures obtained prior to SNF admission: No results found.  Assessment/Plan   ICD-10-CM   1. High risk medication use Z79.899   2. Seizures (HCC) R56.9   3. Atrial flutter, unspecified type (HCC) I48.92   4. Vascular dementia with behavior disturbance F01.51   5. Paranoia (HCC) F22   6. Essential hypertension, benign I10   7. Bipolar disorder, current episode mixed, moderate (HCC) F31.62     Cont current meds as ordered  F/u with neurology as scheduled  F/u with cardio as scheduled  PT/OT/ST as ordered  Psych services to follow  GOAL: short term rehab and d/c home when medically appropriate. Communicated with pt and nursing.    Labs/tests ordered: tegretol level   Ramzey Petrovic S. Ancil Linseyarter, D. O., F. A. C. O. I.  [facility] [address] [place], [place] [address] [phone] Cell (Monday-Friday 8 AM - 5 PM) [phone] After 5 PM and follow prompts

## 2017-03-16 ENCOUNTER — Other Ambulatory Visit: Payer: Self-pay

## 2017-03-16 MED ORDER — TRAMADOL HCL 50 MG PO TABS: 50.0000 mg | ORAL_TABLET | Freq: Four times a day (QID) | ORAL | 0 refills | Status: DC | PRN

## 2017-03-16 NOTE — Telephone Encounter (Signed)
RX faxed to [facility] @ [phone], phone number [phone] 

## 2017-03-27 ENCOUNTER — Other Ambulatory Visit: Payer: Self-pay

## 2017-03-27 MED ORDER — LACOSAMIDE 100 MG PO TABS: 100.0000 mg | ORAL_TABLET | Freq: Two times a day (BID) | ORAL | 0 refills | Status: DC

## 2017-03-27 NOTE — Telephone Encounter (Signed)
RX faxed to [facility] @ [phone], phone number [phone] 

## 2017-04-02 NOTE — Progress Notes (Signed)
Cardiology Office Note Date:  04/03/2017  Patient ID:  Alison StallingFennell Davidian, DOB 06/21/1960, MRN [id number] PCP:  Mortimer Friesurl, David, PA  Cardiologist:  Dr. Graciela HusbandsKlein    Chief Complaint: post ablation follow up  History of Present Illness: Nena AlexanderFennell Anselmo is a 57 y.o. male with history of HTN, Hep C, seizure disorder, hx of cerebral aneurysm rupture resulting in cognitive issues, dementia, and an unsteady gait, hx of ETOH abuse, none in a year. Currently lives in a group home.  He was suffered a syncopal event in October, found with AFlutter w/ 2:1 and 1:1 conduction in November, was planned for TEE >> ablation though unable to pass TEE probe and placed on a/c to follow with ablation.  He underwent ablation 03/03/17, his Eliquis was stopped post procedure day one, as well as his dilt and lopressor with bradycardia, he reported an increase in seizure activity pre-procedure and was seen by neurology without change in his meds and advised to see his neurologist out patient for close follow up.  I notice Dr. Odessa FlemingKlein's last OP note mentioning need for neuro f/u for his neuro/psych meds.  The patient comes in today for f/u post aflutter ablation.  He consistently circles back to his seizures.  He reports his last fainting episodes was 3 months ago, when he was brought to the hospital and found with Aflutter.  He denies any particular cardiac symptoms, he says when he get aggravated or upset (mentions he is hot blooded) he can feel his HR rise, otherwise no particular sym,ptoms that I am able to elicit he tells me he feels like his seizures are not well controlled, reports 1-2 week, in review of his neurology consult last month, this reports is less.  He reports shaking "grand mal" seizures.    Past Medical History:  Diagnosis Date  . Aneurysm (HCC)   . Atrial fibrillation with RVR (HCC) 03/03/2017  . ETOHism (HCC)   . Gait abnormality 05/26/2016  . Hepatitis C   . Hereditary and idiopathic peripheral neuropathy  06/03/2014  . Hypertension   . Seizure [facility](HCC)     Past Surgical History:  Procedure Laterality Date  . A-FLUTTER ABLATION N/A 03/03/2017   Procedure: A-FLUTTER ABLATION;  Surgeon: Duke SalviaKlein, Steven C, MD;  [facility];  Service: Cardiovascular;  Laterality: N/A;  . ATRIAL FLUTTER ABLATION  03/03/2017  . CEREBRAL ANEURYSM REPAIR     [facility]  . HARDWARE REMOVAL Left 10/29/2012   Procedure: HARDWARE REMOVAL;  Surgeon: Sheral Apleyimothy D Murphy, MD;  [facility];  Service: Orthopedics;  Laterality: Left;  . I&D EXTREMITY Left 10/26/2012   Procedure: IRRIGATION AND DEBRIDEMENT Left Elbow;  Surgeon: Sheral Apleyimothy D Murphy, MD;  Location: [facility] OR;  Service: Orthopedics;  Laterality: Left;  . I&D EXTREMITY Left 10/29/2012   Procedure: IRRIGATION AND DEBRIDEMENT EXTREMITY, wound vac change, stimulan beads;  Surgeon: Sheral Apleyimothy D Murphy, MD;  Location: [facility] OR;  Service: Orthopedics;  Laterality: Left;  lateral on bean bag  . I&D EXTREMITY Left 11/02/2012   Procedure: IRRIGATION AND DEBRIDEMENT EXTREMITY;  Surgeon: Sheral Apleyimothy D Murphy, MD;  Location: [facility] OR;  Service: Orthopedics;  Laterality: Left;  . INCISION AND DRAINAGE ABSCESS Left 11/02/2012   Procedure: INCISION AND DRAINAGE ABSCESS;  Surgeon: Sheral Apleyimothy D Murphy, MD;  Location: [facility] OR;  Service: Orthopedics;  Laterality: Left;    Current Outpatient Medications  Medication Sig Dispense Refill  . acetaminophen (TYLENOL) 325 MG tablet Take 650 mg by mouth See admin instructions. Take 2  tablets (650 mg) by mouth three times daily, may also take 2 tablets (650 mg) every 6 hours as needed for mild pain, fever or headache    . Cholecalciferol (VITAMIN D3) 1000 units CAPS Take 1,000 Units by mouth daily.    . Lacosamide (VIMPAT) 100 MG TABS Take 1 tablet (100 mg total) by mouth 2 (two) times daily. 60 tablet 0  . latanoprost (XALATAN) 0.005 % ophthalmic solution Place 1 drop into both eyes at bedtime.    . levETIRAcetam (KEPPRA) 750 MG tablet Take 1,500 mg  by mouth 2 (two) times daily.     . Melatonin 3 MG TABS Take 3 mg by mouth at bedtime.    Marland Kitchen QUEtiapine (SEROQUEL) 50 MG tablet Take 50 mg by mouth daily.   1  . sertraline (ZOLOFT) 100 MG tablet Take 100 mg by mouth daily.    . SUMAtriptan (IMITREX) 50 MG tablet Take 50 mg by mouth See admin instructions. Give 1 tablet (50 mg) by mouth every 24 hours as needed for migraine headache;  May repeat in 2 hours if headache persists or recurs. Do not exceed 100 mg in 24 hours    . traMADol (ULTRAM) 50 MG tablet Take 1 tablet (50 mg total) by mouth every 6 (six) hours as needed. 120 tablet 0  . zolpidem (AMBIEN) 10 MG tablet Take 1 tablet (10 mg total) by mouth at bedtime. 30 tablet 0   No current facility-administered medications for this visit.     Allergies:   Carbamazepine and Depakote [divalproex sodium]   Social History:  The patient  reports that he has been smoking cigarettes.  He has been smoking about 0.25 packs per day. he has never used smokeless tobacco. He reports that he does not drink alcohol or use drugs.   Family History:  The patient's family history includes Cancer in his mother, sister, and sister; Diabetes in his sister; Hypertension in his father and mother.   ROS:  Please see the history of present illness.  All other systems are reviewed and otherwise negative.   PHYSICAL EXAM:  VS:  BP 120/76   Pulse 93   Ht 6\' 6"  (1.981 m)   Wt 254 lb (115.2 kg)   BMI 29.35 kg/m  BMI: Body mass index is 29.35 kg/m. Well nourished, well developed, in no acute distress  HEENT: normocephalic, atraumatic  Neck: no JVD, carotid bruits or masses Cardiac:  RRR; no significant murmurs, no rubs, or gallops Lungs:  CTA b/l, no wheezing, rhonchi or rales  Abd: soft, nontender MS: no deformity or atrophy Ext: no edema  Skin: warm and dry, no rash Neuro:  No gross deficits appreciated Psych: euthymic mood, somewhat odd affect   EKG:  Done today and reviewed by myself is SR, 93bpm, PR  , QRS 82ms, QTc  03/04/17: EPS/Ablation, Dr. Graciela Husbands SUMMARY:  In conclusion, results of electrophysiological testing demonstrated cavotricuspid isthmus conduction that presumed substrate for the patient's clinical typical atrial flutter.  RF energy across the cavotricuspid isthmus interrupted bidirectional cavotricuspid isthmus conduction and limited the patient's substrate.  We will discontinue the patient's anticoagulation, beta-blockers, and calcium blockers.   01/11/17: TTE Study Conclusions - Left ventricle: The cavity size was normal. Wall thickness was   increased in a pattern of moderate LVH. Systolic function was   normal. The estimated ejection fraction was in the range of 55%   to 60%. Wall motion was normal; there were no regional wall   motion abnormalities. The  study is not technically sufficient to   allow evaluation of LV diastolic function. - Mitral valve: There was mild regurgitation. - Right ventricle: The cavity size was mildly dilated. - Right atrium: The atrium was mildly dilated. Impressions: - Normal LV function; ;moderate LVH: mildly dilated aortic root   (4.3 cm); mild MR; mild RAE and RVE.  Recent Labs: 01/10/2017: TSH 1.506 01/16/2017: Magnesium 1.7 02/16/2017: Hemoglobin 14.3; Platelets 176 03/03/2017: ALT 24; BUN 15; Creatinine, Ser 0.73; Potassium 4.2; Sodium 138  No results found for requested labs within last 8760 hours.   CrCl cannot be calculated (Patient's most recent lab result is older than the maximum 21 days allowed.).   Wt Readings from Last 3 Encounters:  04/03/17 254 lb (115.2 kg)  03/09/17 237 lb 3.2 oz (107.6 kg)  03/06/17 237 lb 3.2 oz (107.6 kg)     Other studies reviewed: Additional studies/records reviewed today include: summarized above  ASSESSMENT AND PLAN:  1. AFlutter     CHA2DS2Vasc is one, off a/c s/p ablation Dec 2018     EKG is SR, appears unchanged     No symptoms of bradycardia, no syncope, or  symptoms to suggest recurrent AFlutter  2. HTN     BP looks OK  3. Siezures     Vimpat still not on his med list, my MA called the group home and they confirm he is in-fact getting it      He has f/u with Dr. Anne Hahn in Feb in place    Disposition: We can see him as needed   Current medicines are reviewed at length with the patient today.  The patient did not have any concerns regarding medicines.  Norma Fredrickson, PA-C 04/03/2017 1:55 PM     [facility] [address] [place] [address] [phone] (office)  [phone] (fax)

## 2017-04-03 ENCOUNTER — Ambulatory Visit (INDEPENDENT_AMBULATORY_CARE_PROVIDER_SITE_OTHER): Payer: Medicaid Other | Admitting: Physician Assistant

## 2017-04-03 ENCOUNTER — Encounter (INDEPENDENT_AMBULATORY_CARE_PROVIDER_SITE_OTHER): Payer: Self-pay

## 2017-04-03 VITALS — BP 120/76 | HR 93 | Ht 78.0 in | Wt 254.0 lb

## 2017-04-03 DIAGNOSIS — Z79899 Other long term (current) drug therapy: Secondary | ICD-10-CM | POA: Diagnosis not present

## 2017-04-03 DIAGNOSIS — I10 Essential (primary) hypertension: Secondary | ICD-10-CM | POA: Diagnosis not present

## 2017-04-03 DIAGNOSIS — I483 Typical atrial flutter: Secondary | ICD-10-CM

## 2017-04-03 NOTE — Patient Instructions (Addendum)
,  Medication Instructions:   Your physician recommends that you continue on your current medications as directed. Please refer to the Current Medication list given to you today.   If you need a refill on your cardiac medications before your next appointment, please call your pharmacy.  Labwork: NONE ORDERED  TODAY    Testing/Procedures: NONE ORDERED  TODAY    Follow-Up: CONTACT [facility] [phone] AS NEEDED FOR  ANY CARDIAC RELATED SYMPTOMS    Any Other Special Instructions Will Be Listed Below (If Applicable).

## 2017-04-04 ENCOUNTER — Non-Acute Institutional Stay (SKILLED_NURSING_FACILITY): Payer: Medicaid Other | Admitting: Adult Health

## 2017-04-04 ENCOUNTER — Encounter: Payer: Self-pay | Admitting: Adult Health

## 2017-04-04 DIAGNOSIS — K769 Liver disease, unspecified: Secondary | ICD-10-CM

## 2017-04-04 DIAGNOSIS — I4892 Unspecified atrial flutter: Secondary | ICD-10-CM | POA: Diagnosis not present

## 2017-04-04 DIAGNOSIS — G43809 Other migraine, not intractable, without status migrainosus: Secondary | ICD-10-CM | POA: Diagnosis not present

## 2017-04-04 DIAGNOSIS — I10 Essential (primary) hypertension: Secondary | ICD-10-CM

## 2017-04-04 NOTE — Progress Notes (Signed)
Location:   [facility] Room Number: 228B Place of Service:  SNF (31)   CODE STATUS: full code   Allergies  Allergen Reactions  . Carbamazepine Other (See Comments)    Hyponatremia  . Depakote [Divalproex Sodium] Other (See Comments)    Unknown - on [facility]    Chief Complaint  Patient presents with  . Medical Management of Chronic Issues    Atrial flutter; hypertension; chronic liver disease; mirgrain    HPI:  He is a 57 year old long term resident of this facility being seen for the management of his chronic illnesses: atrial flutter; hypertension; chronic liver disease; migraines. He has had an atrial fluutter ablation performed on 03-03-17. Since that time he has taken to his wheelchair. He is very difficult to get an hpi or ros as he has a very difficult time focusing and maintaining a conversation. He has been declining therapy; today he declined as he did not like the therapist. He will hold onto his trays until the staff has to remove them for food safety. He continues to smoke and will verbally act out at time.   Past Medical History:  Diagnosis Date  . Aneurysm (HCC)   . Atrial fibrillation with RVR (HCC) 03/03/2017  . ETOHism (HCC)   . Gait abnormality 05/26/2016  . Hepatitis C   . 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 Hypertension   . Seizure [facility])     Past Surgical History:  Procedure Laterality Date  . A-FLUTTER ABLATION N/A 03/03/2017   Procedure: A-FLUTTER ABLATION;  Surgeon: Duke Salvia, MD;  Location: [facility] INVASIVE CV LAB;  Service: Cardiovascular;  Laterality: N/A;  . ATRIAL FLUTTER ABLATION  03/03/2017  . CEREBRAL ANEURYSM REPAIR     At [facility]  . HARDWARE REMOVAL Left 10/29/2012   Procedure: HARDWARE REMOVAL;  Surgeon: Sheral Apley, MD;  Location: [facility] OR;  Service: Orthopedics;  Laterality: Left;  . I&D EXTREMITY Left 10/26/2012   Procedure: IRRIGATION AND DEBRIDEMENT Left Elbow;  Surgeon: Sheral Apley,  MD;  Location: [facility] OR;  Service: Orthopedics;  Laterality: Left;  . I&D EXTREMITY Left 10/29/2012   Procedure: IRRIGATION AND DEBRIDEMENT EXTREMITY, wound vac change, stimulan beads;  Surgeon: Sheral Apley, MD;  Location: [facility] OR;  Service: Orthopedics;  Laterality: Left;  lateral on bean bag  . I&D EXTREMITY Left 11/02/2012   Procedure: IRRIGATION AND DEBRIDEMENT EXTREMITY;  Surgeon: Sheral Apley, MD;  Location: [facility] OR;  Service: Orthopedics;  Laterality: Left;  . INCISION AND DRAINAGE ABSCESS Left 11/02/2012   Procedure: INCISION AND DRAINAGE ABSCESS;  Surgeon: Sheral Apley, MD;  Location: [facility] OR;  Service: Orthopedics;  Laterality: Left;    Social History   Socioeconomic History  . Marital status: Single    Spouse name: Not on file  . Number of children: 3  . Years of education: 63 th  . Highest education level: Not on file  Social Needs  . Financial resource strain: Not on file  . Food insecurity - worry: Not on file  . Food insecurity - inability: Not on file  . Transportation needs - medical: Not on file  . Transportation needs - non-medical: Not on file  Occupational History  . Occupation: disabled  Tobacco Use  . Smoking status: Current Every Day Smoker    Packs/day: 0.25    Types: Cigarettes  . Smokeless tobacco: Never Used  Substance and Sexual Activity  . Alcohol use: No  Alcohol/week: 0.0 oz    Comment: quit drinking 11/2011 per patient  . Drug use: No  . Sexual activity: Not Currently  Other Topics Concern  . Not on file  Social History Narrative   He is currently a resident at [facility] facility    Family History  Problem Relation Age of Onset  . Hypertension Mother   . Cancer Mother   . Hypertension Father   . Diabetes Sister   . Cancer Sister   . Cancer Sister       VITAL SIGNS BP 126/70   Pulse 62   Temp 98.4 F (36.9 C)   Resp 16   Ht 6\' 6"  (1.981 m)   Wt 237 lb 3.2 oz (107.6 kg)   SpO2 97%   BMI 27.41 kg/m   Outpatient Encounter  Medications as of 04/04/2017  Medication Sig  . acetaminophen (TYLENOL) 325 MG tablet Take 650 mg by mouth See admin instructions. Take 2 tablets (650 mg) by mouth three times daily, may also take 2 tablets (650 mg) every 6 hours as needed for mild pain, fever or headache  . Cholecalciferol (VITAMIN D3) 1000 units CAPS Take 1,000 Units by mouth daily.  . Lacosamide (VIMPAT) 100 MG TABS Take 1 tablet (100 mg total) by mouth 2 (two) times daily.  Marland Kitchen latanoprost (XALATAN) 0.005 % ophthalmic solution Place 1 drop into both eyes at bedtime.  . levETIRAcetam (KEPPRA) 750 MG tablet Take 1,500 mg by mouth 2 (two) times daily.   . Melatonin 3 MG TABS Take 3 mg by mouth at bedtime.  Marland Kitchen QUEtiapine (SEROQUEL) 50 MG tablet Take 50 mg by mouth daily.   . sertraline (ZOLOFT) 100 MG tablet Take 100 mg by mouth daily.  . SUMAtriptan (IMITREX) 50 MG tablet Take 50 mg by mouth See admin instructions. Give 1 tablet (50 mg) by mouth every 24 hours as needed for migraine headache;  May repeat in 2 hours if headache persists or recurs. Do not exceed 100 mg in 24 hours  . traMADol (ULTRAM) 50 MG tablet Take 1 tablet (50 mg total) by mouth every 6 (six) hours as needed.  . zolpidem (AMBIEN) 10 MG tablet Take 1 tablet (10 mg total) by mouth at bedtime.   No facility-administered encounter medications on file as of 04/04/2017.      SIGNIFICANT DIAGNOSTIC EXAMS  PREVIOUS:   01-10-17: chest x-ray:Stable cardiac prominence. Aortic atherosclerosis. No edema or consolidation. Aortic Atherosclerosis   01-10-17: ct of head and cervical spine:  CT head: Stable moderate diffuse atrophy, slightly more pronounced involving the cerebellum than elsewhere. 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 No acute infarct. There is patchy periventricular small vessel disease. No mass, hemorrhage, or extra-axial fluid collection. Areas present to the right of the sella, chronic and stable. There appears to be  a connection between the posterior sphenoid sinus in this area, possibly residua of old trauma. There are areas of prior nasal trauma and trauma involving the medial left orbital wall. There are foci of paranasal sinus disease currently. There are foci of arterial vascular calcification.  CT cervical spine: No fracture or spondylolisthesis. Multilevel osteoarthritic change present. There are foci of calcification in both carotid and subclavian arteries.  01-11-17:2- d echo:   - Left ventricle: The cavity size was normal. Wall thickness was increased in a pattern of moderate LVH. Systolic function was  normal. The estimated ejection fraction was in the range of 55% to 60%.  Wall motion was normal; there were no regional wall motion abnormalities. The study is not technically sufficient to allow evaluation of LV diastolic function. - Mitral valve: There was mild regurgitation. - Right ventricle: The cavity size was mildly dilated. - Right atrium: The atrium was mildly dilated.  NO NEW EXAMS   LABS REVIEWED:  PREVIOUS:   01-10-17: wbc 13.1; hgb 14.5; hct 41.3; mcv 97.9; plt 300;glcuose 125; bun 14; creat 0.97; k+ 4.0; na++ 137; ca 8.6; liver normal albumin 3.2; tsh 1.506 01-11-17: keppra 25.4; drug screen: + opiates 01-12-17: wbc 8.2; hgb 13.1; hct 38.2; mcv 97.7; plt 274; mag 1.9 01-16-17: wbc 7.8; hgb 13.5; hct 39.3;mcv 97.5; plt 218; glucose 104; bun 11; creat 0.74; k+ 4.0; na++ 138; ca 8.6; liver normal albumin 2.9 01-18-17: wbc 8.1; hgb 13.8; hct 39.6; mcv 98.3; plt 208  02-16-17: wbc 8.2; hgb 14.3; hct 42.9; mcv 98; plt 176; glucose 97; bun 19; creat 0.92; k+ 5.3; na++ 147; ca 9.4 03-03-17: glucose 122; bun 15; creat 0.73; k+ 4.2; na++ 138; ca 8.7; liver normal albumin 3.7; ammonia 40  NO NEW LABS      Review of Systems  Constitutional: Negative for malaise/fatigue.  Respiratory: Negative for cough and shortness of breath.   Cardiovascular: Negative for chest pain, palpitations and leg  swelling.  Gastrointestinal: Negative for abdominal pain, constipation and heartburn.  Musculoskeletal: Positive for back pain. Negative for joint pain and myalgias.       Requires use of wheelchair   Skin: Negative.   Neurological: Negative for dizziness.  Psychiatric/Behavioral: The patient is not nervous/anxious.    Physical Exam  Constitutional: He is oriented to person, place, and time. He appears well-developed and well-nourished. No distress.  Neck: Neck supple. No thyromegaly present.  Cardiovascular: Normal rate, regular rhythm and intact distal pulses.  Murmur heard. 1/6  Pulmonary/Chest: Effort normal and breath sounds normal. No respiratory distress.  Abdominal: Soft. Bowel sounds are normal. He exhibits no distension. There is no tenderness.  Musculoskeletal: Normal range of motion. He exhibits no edema.  Lymphadenopathy:    He has no cervical adenopathy.  Neurological: He is alert and oriented to person, place, and time.  Skin: Skin is warm and dry. He is not diaphoretic.  Psychiatric: He has a normal mood and affect.    ASSESSMENT/ PLAN:  TODAY:   1. Atrial flutter with RVR:  Heart rate stable: is status post failure DVVC: is status post A-flutter ablation is off eliquis  2. Benign essential hypertension: b/p126/70: is stable is not on medications; will not make changes will monitor  His lopressor and cardizem was stopped in the hospital   3. Chronic hepatitis C without hepatic coma: ( has chronic liver disease) no change in status: will continue  4. Migraine headache (is status post cerebral aneurysm repair) stable will continue  imitrex 50  mg daily as needed may repeat in 2 hours as needed no more than 100 mg in 24 hours  PREVIOUS  5. Seizures: no reports of further seizures:  Will continue vimpat 100 mg twice daily and keppra 1500 mg twice daily   6. Bipolar disorder: has a worsening ability to focus and carry on conversation: will continue zoloft 100 mg  daily; seroquel 50 mg daily   I have put out a phone call to Dr. Montez Moritaarter for consultation   7. Insomnia: stable will continue ambien 10 mg nightly and melatonin 3 mg nightly    8.  Chronic pain syndrome: chronic back  pain: no change in status: will continue ultram 50 mg every 6 hours as needed for pain  And is taking tylenol 650 mg three times daily   9.  Vascular dementia; no change in status: weight is 237 pounds; will not make changes at this time.   10. CVA: previous (bilateral frontal lobes): no change in status will monitor        MD is aware of resident's narcotic use and is in agreement with current plan of care. We will attempt to wean resident as apropriate   Synthia Innocent NP [facility]  Contact [phone] Monday through Friday 8am- 5pm  After hours call [phone]

## 2017-04-05 ENCOUNTER — Encounter: Payer: Self-pay | Admitting: Adult Health

## 2017-04-05 NOTE — Progress Notes (Signed)
Entered in error

## 2017-04-06 ENCOUNTER — Encounter: Payer: Self-pay | Admitting: Adult Health

## 2017-04-06 ENCOUNTER — Non-Acute Institutional Stay (SKILLED_NURSING_FACILITY): Payer: Medicaid Other | Admitting: Adult Health

## 2017-04-06 DIAGNOSIS — I6930 Unspecified sequelae of cerebral infarction: Secondary | ICD-10-CM | POA: Diagnosis not present

## 2017-04-06 DIAGNOSIS — F0151 Vascular dementia with behavioral disturbance: Secondary | ICD-10-CM | POA: Diagnosis not present

## 2017-04-06 DIAGNOSIS — F3162 Bipolar disorder, current episode mixed, moderate: Secondary | ICD-10-CM | POA: Diagnosis not present

## 2017-04-06 DIAGNOSIS — F01518 Vascular dementia, unspecified severity, with other behavioral disturbance: Secondary | ICD-10-CM

## 2017-04-06 NOTE — Progress Notes (Signed)
Location:   [facility] Room Number: 228 B Place of Service:  SNF (31)   CODE STATUS: Full Code  Allergies  Allergen Reactions  . Carbamazepine Other (See Comments)    Hyponatremia  . Depakote [Divalproex Sodium] Other (See Comments)    Unknown - on [facility]    Chief Complaint  Patient presents with  . Acute Visit    Care Plan Meeting    HPI:  We have come together for his care plan meeting the team is present; along with there resident and son via phone. We did discuss his therapy progression; the interfering factors. He has agreed to be more cooperative with his therapy. His goal continues to be for him to be independent with his ambulation. He does have a fear of falling.  We did discuss his increased anger; paranoia and manic behaviors. We did discuss that he is not competent to make his own medical decisions per the hospital discharge summary. His son is going to work on getting guardianship over [place].  At this time we did not discuss his MOST form or advanced directives; the family and patient were not ready for this conversation.    Past Medical History:  Diagnosis Date  . Aneurysm (HCC)   . Atrial fibrillation with RVR (HCC) 03/03/2017  . ETOHism (HCC)   . Gait abnormality 05/26/2016  . Hepatitis C   . 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 Hypertension   . Seizure [facility])     Past Surgical History:  Procedure Laterality Date  . A-FLUTTER ABLATION N/A 03/03/2017   Procedure: A-FLUTTER ABLATION;  Surgeon: Duke Salvia, MD;  Location: [facility] INVASIVE CV LAB;  Service: Cardiovascular;  Laterality: N/A;  . ATRIAL FLUTTER ABLATION  03/03/2017  . CEREBRAL ANEURYSM REPAIR     At [facility]  . HARDWARE REMOVAL Left 10/29/2012   Procedure: HARDWARE REMOVAL;  Surgeon: Sheral Apley, MD;  Location: [facility] OR;  Service: Orthopedics;  Laterality: Left;  . I&D EXTREMITY Left 10/26/2012   Procedure: IRRIGATION AND DEBRIDEMENT Left Elbow;   Surgeon: Sheral Apley, MD;  Location: [facility] OR;  Service: Orthopedics;  Laterality: Left;  . I&D EXTREMITY Left 10/29/2012   Procedure: IRRIGATION AND DEBRIDEMENT EXTREMITY, wound vac change, stimulan beads;  Surgeon: Sheral Apley, MD;  Location: [facility] OR;  Service: Orthopedics;  Laterality: Left;  lateral on bean bag  . I&D EXTREMITY Left 11/02/2012   Procedure: IRRIGATION AND DEBRIDEMENT EXTREMITY;  Surgeon: Sheral Apley, MD;  Location: [facility] OR;  Service: Orthopedics;  Laterality: Left;  . INCISION AND DRAINAGE ABSCESS Left 11/02/2012   Procedure: INCISION AND DRAINAGE ABSCESS;  Surgeon: Sheral Apley, MD;  Location: [facility] OR;  Service: Orthopedics;  Laterality: Left;    Social History   Socioeconomic History  . Marital status: Single    Spouse name: Not on file  . Number of children: 3  . Years of education: 64 th  . Highest education level: Not on file  Social Needs  . Financial resource strain: Not on file  . Food insecurity - worry: Not on file  . Food insecurity - inability: Not on file  . Transportation needs - medical: Not on file  . Transportation needs - non-medical: Not on file  Occupational History  . Occupation: disabled  Tobacco Use  . Smoking status: Current Every Day Smoker    Packs/day: 0.25    Types: Cigarettes  . Smokeless tobacco: Never Used  Substance and Sexual Activity  .  Alcohol use: No    Alcohol/week: 0.0 oz    Comment: quit drinking 11/2011 per patient  . Drug use: No  . Sexual activity: Not Currently  Other Topics Concern  . Not on file  Social History Narrative   He is currently a resident at [facility] facility    Family History  Problem Relation Age of Onset  . Hypertension Mother   . Cancer Mother   . Hypertension Father   . Diabetes Sister   . Cancer Sister   . Cancer Sister       VITAL SIGNS BP 124/66   Pulse 70   Temp (!) 97 F (36.1 C)   Resp 18   Ht 6\' 6"  (1.981 m)   Wt 237 lb 3.2 oz (107.6 kg)   SpO2 97%   BMI 27.41  kg/m   Outpatient Encounter Medications as of 04/06/2017  Medication Sig  . acetaminophen (TYLENOL) 325 MG tablet Take 650 mg by mouth See admin instructions. Take 2 tablets (650 mg) by mouth three times daily, may also take 2 tablets (650 mg) every 6 hours as needed for mild pain, fever or headache  . Cholecalciferol (VITAMIN D3) 1000 units CAPS Take 1,000 Units by mouth daily.  . Lacosamide (VIMPAT) 100 MG TABS Take 1 tablet (100 mg total) by mouth 2 (two) times daily.  Marland Kitchen latanoprost (XALATAN) 0.005 % ophthalmic solution Place 1 drop into both eyes at bedtime.  . levETIRAcetam (KEPPRA) 750 MG tablet Take 1,500 mg by mouth 2 (two) times daily.   . Melatonin 3 MG TABS Take 3 mg by mouth at bedtime.  Marland Kitchen QUEtiapine (SEROQUEL) 50 MG tablet Take 50 mg by mouth daily.   . sertraline (ZOLOFT) 100 MG tablet Take 100 mg by mouth daily.  . SUMAtriptan (IMITREX) 50 MG tablet Take 50 mg by mouth See admin instructions. Give 1 tablet (50 mg) by mouth every 24 hours as needed for migraine headache;  May repeat in 2 hours if headache persists or recurs. Do not exceed 100 mg in 24 hours  . traMADol (ULTRAM) 50 MG tablet Take 1 tablet (50 mg total) by mouth every 6 (six) hours as needed.  . zolpidem (AMBIEN) 10 MG tablet Take 1 tablet (10 mg total) by mouth at bedtime.   No facility-administered encounter medications on file as of 04/06/2017.      SIGNIFICANT DIAGNOSTIC EXAMS  PREVIOUS:   01-10-17: chest x-ray:Stable cardiac prominence. Aortic atherosclerosis. No edema or consolidation. Aortic Atherosclerosis   01-10-17: ct of head and cervical spine:  CT head: Stable moderate diffuse atrophy, slightly more pronounced involving the cerebellum than elsewhere. 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 No acute infarct. There is patchy periventricular small vessel disease. No mass, hemorrhage, or extra-axial fluid collection. Areas present to the right of the sella, chronic  and stable. There appears to be a connection between the posterior sphenoid sinus in this area, possibly residua of old trauma. There are areas of prior nasal trauma and trauma involving the medial left orbital wall. There are foci of paranasal sinus disease currently. There are foci of arterial vascular calcification.  CT cervical spine: No fracture or spondylolisthesis. Multilevel osteoarthritic change present. There are foci of calcification in both carotid and subclavian arteries.  01-11-17:2- d echo:   - Left ventricle: The cavity size was normal. Wall thickness was increased in a pattern of moderate LVH. Systolic function was  normal. The estimated ejection fraction was  in the range of 55% to 60%. Wall motion was normal; there were no regional wall motion abnormalities. The study is not technically sufficient to allow evaluation of LV diastolic function. - Mitral valve: There was mild regurgitation. - Right ventricle: The cavity size was mildly dilated. - Right atrium: The atrium was mildly dilated.  NO NEW EXAMS   LABS REVIEWED:  PREVIOUS:   01-10-17: wbc 13.1; hgb 14.5; hct 41.3; mcv 97.9; plt 300;glcuose 125; bun 14; creat 0.97; k+ 4.0; na++ 137; ca 8.6; liver normal albumin 3.2; tsh 1.506 01-11-17: keppra 25.4; drug screen: + opiates 01-12-17: wbc 8.2; hgb 13.1; hct 38.2; mcv 97.7; plt 274; mag 1.9 01-16-17: wbc 7.8; hgb 13.5; hct 39.3;mcv 97.5; plt 218; glucose 104; bun 11; creat 0.74; k+ 4.0; na++ 138; ca 8.6; liver normal albumin 2.9 01-18-17: wbc 8.1; hgb 13.8; hct 39.6; mcv 98.3; plt 208  02-16-17: wbc 8.2; hgb 14.3; hct 42.9; mcv 98; plt 176; glucose 97; bun 19; creat 0.92; k+ 5.3; na++ 147; ca 9.4 03-03-17: glucose 122; bun 15; creat 0.73; k+ 4.2; na++ 138; ca 8.7; liver normal albumin 3.7; ammonia 40  NO NEW LABS      Review of Systems  Constitutional: Negative for malaise/fatigue.  Respiratory: Negative for cough and shortness of breath.   Cardiovascular: Negative for  chest pain, palpitations and leg swelling.  Gastrointestinal: Negative for abdominal pain, constipation and heartburn.  Musculoskeletal: Positive for back pain. Negative for joint pain and myalgias.       Now requires use of wheelchair   Skin: Negative.   Neurological: Negative for dizziness.  Psychiatric/Behavioral: The patient is not nervous/anxious.     Physical Exam  Constitutional: He is oriented to person, place, and time. He appears well-developed and well-nourished. No distress.  Neck: No thyromegaly present.  Cardiovascular: Normal rate, regular rhythm and intact distal pulses.  Murmur heard. 1/6  Pulmonary/Chest: Effort normal and breath sounds normal. No stridor. No respiratory distress.  Abdominal: Soft. Bowel sounds are normal. He exhibits no distension. There is no tenderness.  Musculoskeletal: Normal range of motion. He exhibits no edema.  Lymphadenopathy:    He has no cervical adenopathy.  Neurological: He is alert and oriented to person, place, and time.  Skin: Skin is warm and dry. He is not diaphoretic.  Psychiatric:  Is easily agitated    ASSESSMENT/ PLAN:  TODAY:   1. Bipolar disorder mixed moderate:  2. Vascular dementia with behavioral disturbance  3. Late effect of cva  Will continue therapy as directed Will continue his current plan of care Will stop his previous seroquel order Will begin seroquel xr 100 mg daily and will monitor his status.   Time spent with patient: 45 minutes: discussed his medical status; medication regimen; therapy needs; behavioral issues; and capacity . Son verbalized understanding.   MD is aware of resident's narcotic use and is in agreement with current plan of care. We will attempt to wean resident as apropriate    Synthia Innocenteborah Fanchon Papania NP [facility]  Contact [phone] Monday through Friday 8am- 5pm  After hours call [phone]

## 2017-04-12 ENCOUNTER — Non-Acute Institutional Stay (SKILLED_NURSING_FACILITY): Payer: Medicaid Other | Admitting: Adult Health

## 2017-04-12 ENCOUNTER — Encounter: Payer: Self-pay | Admitting: Adult Health

## 2017-04-12 ENCOUNTER — Other Ambulatory Visit: Payer: Self-pay

## 2017-04-12 DIAGNOSIS — J00 Acute nasopharyngitis [common cold]: Secondary | ICD-10-CM | POA: Diagnosis not present

## 2017-04-12 DIAGNOSIS — K08409 Partial loss of teeth, unspecified cause, unspecified class: Secondary | ICD-10-CM

## 2017-04-12 MED ORDER — CODEINE SULFATE 15 MG PO TABS: 15.0000 mg | ORAL_TABLET | ORAL | 0 refills | Status: DC

## 2017-04-12 NOTE — Progress Notes (Signed)
Location:   [facility] Room Number: 228 B Place of Service:  SNF (31)   CODE STATUS: Full Code  Allergies  Allergen Reactions  . Carbamazepine Other (See Comments)    Hyponatremia  . Depakote [Divalproex Sodium] Other (See Comments)    [facility]    Chief Complaint  Patient presents with  . Acute Visit    Cough and congestion    HPI:  He is complaining of cough and congestion that started yesterday. He is also complaining of pain from his teeth removal yesterday. There are no reports of fevers present. He is able to eat without difficulty. He denies any chest pain or shortness of present.    Past Medical History:  Diagnosis Date  . Aneurysm (HCC)   . Atrial fibrillation with RVR (HCC) 03/03/2017  . ETOHism (HCC)   . Gait abnormality 05/26/2016  . Hepatitis C   . 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 Hypertension   . Seizure [facility](HCC)     Past Surgical History:  Procedure Laterality Date  . A-FLUTTER ABLATION N/A 03/03/2017   Procedure: A-FLUTTER ABLATION;  Surgeon: Duke SalviaKlein, Steven C, MD;  [facility];  Service: Cardiovascular;  Laterality: N/A;  . ATRIAL FLUTTER ABLATION  03/03/2017  . CEREBRAL ANEURYSM REPAIR     [facility]  . HARDWARE REMOVAL Left 10/29/2012   Procedure: HARDWARE REMOVAL;  Surgeon: Sheral Apleyimothy D Murphy, MD;  [facility];  Service: Orthopedics;  Laterality: Left;  . I&D EXTREMITY Left 10/26/2012   Procedure: IRRIGATION AND DEBRIDEMENT Left Elbow;  Surgeon: Sheral Apleyimothy D Murphy, MD;  Location: [facility] OR;  Service: Orthopedics;  Laterality: Left;  . I&D EXTREMITY Left 10/29/2012   Procedure: IRRIGATION AND DEBRIDEMENT EXTREMITY, wound vac change, stimulan beads;  Surgeon: Sheral Apleyimothy D Murphy, MD;  Location: [facility] OR;  Service: Orthopedics;  Laterality: Left;  lateral on bean bag  . I&D EXTREMITY Left 11/02/2012   Procedure: IRRIGATION AND DEBRIDEMENT EXTREMITY;  Surgeon: Sheral Apleyimothy D Murphy, MD;  Location: [facility] OR;   Service: Orthopedics;  Laterality: Left;  . INCISION AND DRAINAGE ABSCESS Left 11/02/2012   Procedure: INCISION AND DRAINAGE ABSCESS;  Surgeon: Sheral Apleyimothy D Murphy, MD;  Location: [facility] OR;  Service: Orthopedics;  Laterality: Left;    Social History   Socioeconomic History  . Marital status: Single    Spouse name: Not on file  . Number of children: 3  . Years of education: 4511 th  . Highest education level: Not on file  Social Needs  . Financial resource strain: Not on file  . Food insecurity - worry: Not on file  . Food insecurity - inability: Not on file  . Transportation needs - medical: Not on file  . Transportation needs - non-medical: Not on file  Occupational History  . Occupation: disabled  Tobacco Use  . Smoking status: Current Every Day Smoker    Packs/day: 0.25    Types: Cigarettes  . Smokeless tobacco: Never Used  Substance and Sexual Activity  . Alcohol use: No    Alcohol/week: 0.0 oz    Comment: quit drinking 11/2011 per patient  . Drug use: No  . Sexual activity: Not Currently  Other Topics Concern  . Not on file  Social History Narrative   [facility]    Family History  Problem Relation Age of Onset  . Hypertension Mother   . Cancer Mother   . Hypertension Father   .  Diabetes Sister   . Cancer Sister   . Cancer Sister       VITAL SIGNS Ht 6\' 6"  (1.981 m)   Wt 241 lb (109.3 kg)   BMI 27.85 kg/m   Outpatient Encounter Medications as of 04/12/2017  Medication Sig  . acetaminophen (TYLENOL) 325 MG tablet Take 650 mg by mouth See admin instructions. Take 2 tablets (650 mg) by mouth three times daily  . Cholecalciferol (VITAMIN D3) 1000 units CAPS Take 1,000 Units by mouth daily.  Marland Kitchen guaiFENesin (ROBITUSSIN) 100 MG/5ML liquid Give 20 ml by mouth every 6 hours as need for cough and congestion  . Lacosamide (VIMPAT) 100 MG TABS Take 1 tablet (100 mg total) by mouth 2 (two) times daily.  Marland Kitchen latanoprost (XALATAN) 0.005 %  ophthalmic solution Place 1 drop into both eyes at bedtime.  . levETIRAcetam (KEPPRA) 750 MG tablet Take 1,500 mg by mouth 2 (two) times daily.   . Melatonin 3 MG TABS Take 3 mg by mouth at bedtime.  Marland Kitchen QUEtiapine (SEROQUEL) 100 MG tablet Take 100 mg by mouth daily.   . sertraline (ZOLOFT) 100 MG tablet Take 100 mg by mouth daily.  . SUMAtriptan (IMITREX) 50 MG tablet Take 50 mg by mouth See admin instructions. Give 1 tablet (50 mg) by mouth every 24 hours as needed for migraine headache;  May repeat in 2 hours if headache persists or recurs. Do not exceed 100 mg in 24 hours  . traMADol (ULTRAM) 50 MG tablet Take 1 tablet (50 mg total) by mouth every 6 (six) hours as needed.  . zolpidem (AMBIEN) 10 MG tablet Take 1 tablet (10 mg total) by mouth at bedtime.   No facility-administered encounter medications on file as of 04/12/2017.      SIGNIFICANT DIAGNOSTIC EXAMS  PREVIOUS:   01-10-17: chest x-ray:Stable cardiac prominence. Aortic atherosclerosis. No edema or consolidation. Aortic Atherosclerosis   01-10-17: ct of head and cervical spine:  CT head: Stable moderate diffuse atrophy, slightly more pronounced involving the cerebellum than elsewhere. 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 No acute infarct. There is patchy periventricular small vessel disease. No mass, hemorrhage, or extra-axial fluid collection. Areas present to the right of the sella, chronic and stable. There appears to be a connection between the posterior sphenoid sinus in this area, possibly residua of old trauma. There are areas of prior nasal trauma and trauma involving the medial left orbital wall. There are foci of paranasal sinus disease currently. There are foci of arterial vascular calcification.  CT cervical spine: No fracture or spondylolisthesis. Multilevel osteoarthritic change present. There are foci of calcification in both carotid and subclavian arteries.  01-11-17:2- d echo:     - Left ventricle: The cavity size was normal. Wall thickness was increased in a pattern of moderate LVH. Systolic function was  normal. The estimated ejection fraction was in the range of 55% to 60%. Wall motion was normal; there were no regional wall motion abnormalities. The study is not technically sufficient to allow evaluation of LV diastolic function. - Mitral valve: There was mild regurgitation. - Right ventricle: The cavity size was mildly dilated. - Right atrium: The atrium was mildly dilated.  NO NEW EXAMS   LABS REVIEWED:  PREVIOUS:   01-10-17: wbc 13.1; hgb 14.5; hct 41.3; mcv 97.9; plt 300;glcuose 125; bun 14; creat 0.97; k+ 4.0; na++ 137; ca 8.6; liver normal albumin 3.2; tsh 1.506 01-11-17: keppra 25.4; drug screen: + opiates  01-12-17: wbc 8.2; hgb 13.1; hct 38.2; mcv 97.7; plt 274; mag 1.9 01-16-17: wbc 7.8; hgb 13.5; hct 39.3;mcv 97.5; plt 218; glucose 104; bun 11; creat 0.74; k+ 4.0; na++ 138; ca 8.6; liver normal albumin 2.9 01-18-17: wbc 8.1; hgb 13.8; hct 39.6; mcv 98.3; plt 208  02-16-17: wbc 8.2; hgb 14.3; hct 42.9; mcv 98; plt 176; glucose 97; bun 19; creat 0.92; k+ 5.3; na++ 147; ca 9.4 03-03-17: glucose 122; bun 15; creat 0.73; k+ 4.2; na++ 138; ca 8.7; liver normal albumin 3.7; ammonia 40  NO NEW LABS      Review of Systems  Constitutional: Negative for malaise/fatigue.  HENT: Positive for congestion.        Has mouth pain due to teeth extraction  Respiratory: Positive for cough. Negative for sputum production and shortness of breath.   Cardiovascular: Negative for chest pain, palpitations and leg swelling.  Gastrointestinal: Negative for abdominal pain, constipation and heartburn.  Musculoskeletal: Negative for back pain, joint pain and myalgias.  Skin: Negative.   Neurological: Negative for dizziness.  Psychiatric/Behavioral: The patient is not nervous/anxious.       Physical Exam  Constitutional: He is oriented to person, place, and time. He appears  well-developed and well-nourished. No distress.  HENT:  Nose: Nose normal.  Mouth/Throat: Oropharynx is clear and moist.  No signs of infection in gum line   Neck: No thyromegaly present.  Cardiovascular: Normal rate, regular rhythm and intact distal pulses.  Murmur heard. 1/6  Pulmonary/Chest: Effort normal and breath sounds normal. No respiratory distress.  Abdominal: Soft. Bowel sounds are normal. He exhibits no distension. There is no tenderness.  Musculoskeletal: Normal range of motion. He exhibits no edema.  Using wheelchair   Lymphadenopathy:    He has no cervical adenopathy.  Neurological: He is alert and oriented to person, place, and time.  Skin: Skin is warm and dry. He is not diaphoretic.  Psychiatric: He has a normal mood and affect.     ASSESSMENT/ PLAN:  TODAY:   1. Common cold 2. Pain due to teeth extraction  Will begin atrovent neb every 4 hours for 2 days Robitussin DM 15 cc every 4 hours for 4 days Will being codeine 15 mg every 8 hours for 4 days then stop  MD is aware of resident's narcotic use and is in agreement with current plan of care. We will attempt to wean resident as apropriate   Synthia Innocent NP [facility]  Contact [phone] Monday through Friday 8am- 5pm  After hours call [phone]

## 2017-04-12 NOTE — Telephone Encounter (Signed)
RX faxed to [facility] @ [phone], phone number [phone] 

## 2017-04-13 ENCOUNTER — Encounter: Payer: Self-pay | Admitting: Adult Health

## 2017-04-13 ENCOUNTER — Non-Acute Institutional Stay (SKILLED_NURSING_FACILITY): Payer: Medicaid Other | Admitting: Adult Health

## 2017-04-13 DIAGNOSIS — Z20828 Contact with and (suspected) exposure to other viral communicable diseases: Secondary | ICD-10-CM | POA: Diagnosis not present

## 2017-04-13 NOTE — Progress Notes (Signed)
Location:   [facility] Room Number: 228 B Place of Service:  SNF (31)   CODE STATUS: Full code  Allergies  Allergen Reactions  . Carbamazepine Other (See Comments)    Hyponatremia  . Depakote [Divalproex Sodium] Other (See Comments)    Unknown - on [facility]    Chief Complaint  Patient presents with  . Acute Visit    Flu like symptoms    HPI:  He has been exposed to influenza A. There is an outbreak on this unit. He denies fevers; no body aches or pain. His cough is better today. No sore throat. There are no reports of fever present. There are no nursing concerns at this time.   Past Medical History:  Diagnosis Date  . Aneurysm (HCC)   . Atrial fibrillation with RVR (HCC) 03/03/2017  . ETOHism (HCC)   . Gait abnormality 05/26/2016  . Hepatitis C   . 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 Hypertension   . Seizure [facility]     Past Surgical History:  Procedure Laterality Date  . A-FLUTTER ABLATION N/A 03/03/2017   Procedure: A-FLUTTER ABLATION;  Surgeon: Duke Salvia, MD;  Location: [facility] INVASIVE CV LAB;  Service: Cardiovascular;  Laterality: N/A;  . ATRIAL FLUTTER ABLATION  03/03/2017  . CEREBRAL ANEURYSM REPAIR     At [facility]  . HARDWARE REMOVAL Left 10/29/2012   Procedure: HARDWARE REMOVAL;  Surgeon: Sheral Apley, MD;  [facility] OR;  Service: Orthopedics;  Laterality: Left;  . I&D EXTREMITY Left 10/26/2012   Procedure: IRRIGATION AND DEBRIDEMENT Left Elbow;  Surgeon: Sheral Apley, MD;  Location: [facility] OR;  Service: Orthopedics;  Laterality: Left;  . I&D EXTREMITY Left 10/29/2012   Procedure: IRRIGATION AND DEBRIDEMENT EXTREMITY, wound vac change, stimulan beads;  Surgeon: Sheral Apley, MD;  Location: [facility] OR;  Service: Orthopedics;  Laterality: Left;  lateral on bean bag  . I&D EXTREMITY Left 11/02/2012   Procedure: IRRIGATION AND DEBRIDEMENT EXTREMITY;  Surgeon: Sheral Apley, MD;  Location: [facility] OR;  Service: Orthopedics;   Laterality: Left;  . INCISION AND DRAINAGE ABSCESS Left 11/02/2012   Procedure: INCISION AND DRAINAGE ABSCESS;  Surgeon: Sheral Apley, MD;  Location: [facility] OR;  Service: Orthopedics;  Laterality: Left;    Social History   Socioeconomic History  . Marital status: Single    Spouse name: Not on file  . Number of children: 3  . Years of education: 59 th  . Highest education level: Not on file  Social Needs  . Financial resource strain: Not on file  . Food insecurity - worry: Not on file  . Food insecurity - inability: Not on file  . Transportation needs - medical: Not on file  . Transportation needs - non-medical: Not on file  Occupational History  . Occupation: disabled  Tobacco Use  . Smoking status: Current Every Day Smoker    Packs/day: 0.25    Types: Cigarettes  . Smokeless tobacco: Never Used  Substance and Sexual Activity  . Alcohol use: No    Alcohol/week: 0.0 oz    Comment: quit drinking 11/2011 per patient  . Drug use: No  . Sexual activity: Not Currently  Other Topics Concern  . Not on file  Social History Narrative   He is currently a resident at [facility]    Family History  Problem Relation Age of Onset  . Hypertension Mother   . Cancer Mother   . Hypertension Father   .  Diabetes Sister   . Cancer Sister   . Cancer Sister       VITAL SIGNS BP 124/66   Pulse 70   Temp (!) 97 F (36.1 C)   Resp 18   Ht 6\' 6"  (1.981 m)   Wt 241 lb (109.3 kg)   SpO2 97%   BMI 27.85 kg/m    Outpatient Encounter Medications as of 04/13/2017  Medication Sig  . acetaminophen (TYLENOL) 325 MG tablet Take 650 mg by mouth See admin instructions. Take 2 tablets (650 mg) by mouth three times daily  . Cholecalciferol (VITAMIN D3) 1000 units CAPS Take 1,000 Units by mouth daily.  . codeine 15 MG tablet Take 15 mg by mouth every 8 (eight) hours. X 4 days  . guaiFENesin (ROBITUSSIN) 100 MG/5ML liquid Give 20 ml by mouth every 6 hours as need for cough and congestion    . guaiFENesin-codeine 100-10 MG/5ML syrup Take 15 mLs by mouth every 4 (four) hours. X 4 days  . ipratropium-albuterol (DUONEB) 0.5-2.5 (3) MG/3ML SOLN Take 3 mLs by nebulization every 4 (four) hours. X 2 days  . Lacosamide (VIMPAT) 100 MG TABS Take 1 tablet (100 mg total) by mouth 2 (two) times daily.  Marland Kitchen latanoprost (XALATAN) 0.005 % ophthalmic solution Place 1 drop into both eyes at bedtime.  . levETIRAcetam (KEPPRA) 750 MG tablet Take 1,500 mg by mouth 2 (two) times daily.   . Melatonin 3 MG TABS Take 3 mg by mouth at bedtime.  Marland Kitchen QUEtiapine (SEROQUEL) 100 MG tablet Take 100 mg by mouth daily.   . sertraline (ZOLOFT) 100 MG tablet Take 100 mg by mouth daily.  . SUMAtriptan (IMITREX) 50 MG tablet Take 50 mg by mouth See admin instructions. Give 1 tablet (50 mg) by mouth every 24 hours as needed for migraine headache;  May repeat in 2 hours if headache persists or recurs. Do not exceed 100 mg in 24 hours  . traMADol (ULTRAM) 50 MG tablet Take 1 tablet (50 mg total) by mouth every 6 (six) hours as needed.  . zolpidem (AMBIEN) 10 MG tablet Take 1 tablet (10 mg total) by mouth at bedtime.  . [DISCONTINUED] codeine 15 MG tablet Take 1 tablet (15 mg total) by mouth every 4 (four) hours for 4 days. (Patient not taking: Reported on 04/13/2017)   No facility-administered encounter medications on file as of 04/13/2017.      SIGNIFICANT DIAGNOSTIC EXAMS  PREVIOUS:   01-10-17: chest x-ray:Stable cardiac prominence. Aortic atherosclerosis. No edema or consolidation. Aortic Atherosclerosis   01-10-17: ct of head and cervical spine:  CT head: Stable moderate diffuse atrophy, slightly more pronounced involving the cerebellum than elsewhere. 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 No acute infarct. There is patchy periventricular small vessel disease. No mass, hemorrhage, or extra-axial fluid collection. Areas present to the right of the sella, chronic and stable. There  appears to be a connection between the posterior sphenoid sinus in this area, possibly residua of old trauma. There are areas of prior nasal trauma and trauma involving the medial left orbital wall. There are foci of paranasal sinus disease currently. There are foci of arterial vascular calcification.  CT cervical spine: No fracture or spondylolisthesis. Multilevel osteoarthritic change present. There are foci of calcification in both carotid and subclavian arteries.  01-11-17:2- d echo:   - Left ventricle: The cavity size was normal. Wall thickness was increased in a pattern of moderate LVH. Systolic function was  normal. The estimated ejection fraction was in the range of 55% to 60%. Wall motion was normal; there were no regional wall motion abnormalities. The study is not technically sufficient to allow evaluation of LV diastolic function. - Mitral valve: There was mild regurgitation. - Right ventricle: The cavity size was mildly dilated. - Right atrium: The atrium was mildly dilated.  NO NEW EXAMS   LABS REVIEWED:  PREVIOUS:   01-10-17: wbc 13.1; hgb 14.5; hct 41.3; mcv 97.9; plt 300;glcuose 125; bun 14; creat 0.97; k+ 4.0; na++ 137; ca 8.6; liver normal albumin 3.2; tsh 1.506 01-11-17: keppra 25.4; drug screen: + opiates 01-12-17: wbc 8.2; hgb 13.1; hct 38.2; mcv 97.7; plt 274; mag 1.9 01-16-17: wbc 7.8; hgb 13.5; hct 39.3;mcv 97.5; plt 218; glucose 104; bun 11; creat 0.74; k+ 4.0; na++ 138; ca 8.6; liver normal albumin 2.9 01-18-17: wbc 8.1; hgb 13.8; hct 39.6; mcv 98.3; plt 208  02-16-17: wbc 8.2; hgb 14.3; hct 42.9; mcv 98; plt 176; glucose 97; bun 19; creat 0.92; k+ 5.3; na++ 147; ca 9.4 03-03-17: glucose 122; bun 15; creat 0.73; k+ 4.2; na++ 138; ca 8.7; liver normal albumin 3.7; ammonia 40  NO NEW LABS     Review of Systems  Constitutional: Negative for fever and malaise/fatigue.  Respiratory: Positive for cough. Negative for shortness of breath.        Is getting better     Cardiovascular: Negative for chest pain, palpitations and leg swelling.  Gastrointestinal: Negative for abdominal pain, constipation and heartburn.  Musculoskeletal: Negative for back pain, joint pain and myalgias.  Skin: Negative.   Neurological: Negative for dizziness.  Psychiatric/Behavioral: The patient is not nervous/anxious.       Physical Exam  Constitutional: He is oriented to person, place, and time. He appears well-developed and well-nourished. No distress.  HENT:  Nose: Nose normal.  Mouth/Throat: Oropharynx is clear and moist.  Eyes: Conjunctivae are normal.  Neck: No thyromegaly present.  Cardiovascular: Normal rate, regular rhythm and intact distal pulses.  Murmur heard. 1/6  Pulmonary/Chest: Effort normal and breath sounds normal. No respiratory distress.  Abdominal: Soft. Bowel sounds are normal. There is no tenderness.  Musculoskeletal: Normal range of motion. He exhibits no edema.  Is using wheelchair   Lymphadenopathy:    He has no cervical adenopathy.  Neurological: He is alert and oriented to person, place, and time.  Skin: Skin is warm and dry. He is not diaphoretic.  Psychiatric: He has a normal mood and affect.      ASSESSMENT/ PLAN:  TODAY:   1. Exposure to influenza: will begin tamilfu 75 mg daily for 14 days and will monitor   MD is aware of resident's narcotic use and is in agreement with current plan of care. We will attempt to wean resident as apropriate   Synthia Innocenteborah Ricardo Kayes NP [facility]  Contact [phone] Monday through Friday 8am- 5pm  After hours call [phone]

## 2017-04-23 ENCOUNTER — Encounter: Payer: Self-pay | Admitting: Internal Medicine

## 2017-05-04 ENCOUNTER — Other Ambulatory Visit: Payer: Self-pay | Admitting: *Deleted

## 2017-05-04 ENCOUNTER — Non-Acute Institutional Stay (SKILLED_NURSING_FACILITY): Payer: Medicaid Other | Admitting: Adult Health

## 2017-05-04 ENCOUNTER — Encounter: Payer: Self-pay | Admitting: Adult Health

## 2017-05-04 DIAGNOSIS — F3162 Bipolar disorder, current episode mixed, moderate: Secondary | ICD-10-CM | POA: Diagnosis not present

## 2017-05-04 DIAGNOSIS — R569 Unspecified convulsions: Secondary | ICD-10-CM

## 2017-05-04 DIAGNOSIS — F5101 Primary insomnia: Secondary | ICD-10-CM

## 2017-05-04 DIAGNOSIS — G894 Chronic pain syndrome: Secondary | ICD-10-CM | POA: Diagnosis not present

## 2017-05-04 DIAGNOSIS — F22 Delusional disorders: Secondary | ICD-10-CM | POA: Diagnosis not present

## 2017-05-04 MED ORDER — LACOSAMIDE 100 MG PO TABS: 100.0000 mg | ORAL_TABLET | Freq: Two times a day (BID) | ORAL | 0 refills | Status: DC

## 2017-05-04 MED ORDER — TRAMADOL HCL 50 MG PO TABS: 50.0000 mg | ORAL_TABLET | Freq: Four times a day (QID) | ORAL | 0 refills | Status: DC | PRN

## 2017-05-04 NOTE — Telephone Encounter (Signed)
Per Candelaria Celesteebbie Green-Document into chart. Faxed to pharmacy 05/01/17

## 2017-05-04 NOTE — Progress Notes (Signed)
Location:   [facility] Room Number: [id number] Place of Service:  SNF (31)   CODE STATUS: Full Code  Allergies  Allergen Reactions  . Carbamazepine Other (See Comments)    Hyponatremia  . Depakote [Divalproex Sodium] Other (See Comments)    Unknown - [facility]    Chief Complaint  Patient presents with  . Medical Management of Chronic Issues    Seizure; bipolar disoorder; paranoia; chronic pain; insomnia     HPI:  He is a 57 year old long term resident of this facility being seen for the management of his chronic illnesses: seizure; bipolar disorder; paranoia; chronic pain insomnia. He denies uncontrolled pain; states he is sleeping at night. No reports of further seizure activity. There are no nursing concerns at this time.   Past Medical History:  Diagnosis Date  . Aneurysm (HCC)   . Atrial fibrillation with RVR (HCC) 03/03/2017  . ETOHism (HCC)   . Gait abnormality 05/26/2016  . Hepatitis C   . 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 Hypertension   . Seizure [facility](HCC)     Past Surgical History:  Procedure Laterality Date  . A-FLUTTER ABLATION N/A 03/03/2017   Procedure: A-FLUTTER ABLATION;  Surgeon: Duke SalviaKlein, Steven C, MD;  [facility];  Service: Cardiovascular;  Laterality: N/A;  . ATRIAL FLUTTER ABLATION  03/03/2017  . CEREBRAL ANEURYSM REPAIR     At [facility]  . HARDWARE REMOVAL Left 10/29/2012   Procedure: HARDWARE REMOVAL;  Surgeon: Sheral Apleyimothy D Murphy, MD;  [facility];  Service: Orthopedics;  Laterality: Left;  . I&D EXTREMITY Left 10/26/2012   Procedure: IRRIGATION AND DEBRIDEMENT Left Elbow;  Surgeon: Sheral Apleyimothy D Murphy, MD;  Location: [facility] OR;  Service: Orthopedics;  Laterality: Left;  . I&D EXTREMITY Left 10/29/2012   Procedure: IRRIGATION AND DEBRIDEMENT EXTREMITY, wound vac change, stimulan beads;  Surgeon: Sheral Apleyimothy D Murphy, MD;  Location: [facility] OR;  Service: Orthopedics;  Laterality: Left;  lateral on bean  bag  . I&D EXTREMITY Left 11/02/2012   Procedure: IRRIGATION AND DEBRIDEMENT EXTREMITY;  Surgeon: Sheral Apleyimothy D Murphy, MD;  Location: [facility] OR;  Service: Orthopedics;  Laterality: Left;  . INCISION AND DRAINAGE ABSCESS Left 11/02/2012   Procedure: INCISION AND DRAINAGE ABSCESS;  Surgeon: Sheral Apleyimothy D Murphy, MD;  Location: [facility] OR;  Service: Orthopedics;  Laterality: Left;    Social History   Socioeconomic History  . Marital status: Single    Spouse name: Not on file  . Number of children: 3  . Years of education: 5111 th  . Highest education level: Not on file  Social Needs  . Financial resource strain: Not on file  . Food insecurity - worry: Not on file  . Food insecurity - inability: Not on file  . Transportation needs - medical: Not on file  . Transportation needs - non-medical: Not on file  Occupational History  . Occupation: disabled  Tobacco Use  . Smoking status: Current Every Day Smoker    Packs/day: 0.25    Types: Cigarettes  . Smokeless tobacco: Never Used  Substance and Sexual Activity  . Alcohol use: No    Alcohol/week: 0.0 oz    Comment: quit drinking 11/2011 per patient  . Drug use: No  . Sexual activity: Not Currently  Other Topics Concern  . Not on file  Social History Narrative   [facility]    Family History  Problem Relation Age of Onset  .  Hypertension Mother   . Cancer Mother   . Hypertension Father   . Diabetes Sister   . Cancer Sister   . Cancer Sister       VITAL SIGNS BP 120/72   Pulse 70   Temp (!) 97.2 F (36.2 C) (Oral)   Resp 18   Ht 6\' 6"  (1.981 m)   Wt 241 lb (109.3 kg)   SpO2 98%   BMI 27.85 kg/m   Outpatient Encounter Medications as of 05/04/2017  Medication Sig  . acetaminophen (TYLENOL) 325 MG tablet Take 650 mg by mouth See admin instructions. Take 2 tablets (650 mg) by mouth three times daily  . Cholecalciferol (VITAMIN D3) 1000 units CAPS Take 1,000 Units by mouth daily.  Marland Kitchen guaiFENesin  (ROBITUSSIN) 100 MG/5ML liquid Give 20 ml by mouth every 6 hours as need for cough and congestion  . Lacosamide (VIMPAT) 100 MG TABS Take 1 tablet (100 mg total) by mouth 2 (two) times daily.  Marland Kitchen latanoprost (XALATAN) 0.005 % ophthalmic solution Place 1 drop into both eyes at bedtime.  . levETIRAcetam (KEPPRA) 750 MG tablet Take 1,500 mg by mouth 2 (two) times daily.   . Melatonin 3 MG TABS Take 3 mg by mouth at bedtime.  Marland Kitchen QUEtiapine XR (SEROQUEL) 100 MG tablet Take 100 mg by mouth daily.   . sertraline (ZOLOFT) 100 MG tablet Take 100 mg by mouth daily.  . SUMAtriptan (IMITREX) 50 MG tablet Take 50 mg by mouth See admin instructions. Give 1 tablet (50 mg) by mouth every 24 hours as needed for migraine headache;  May repeat in 2 hours if headache persists or recurs. Do not exceed 100 mg in 24 hours  . traMADol (ULTRAM) 50 MG tablet Take 1 tablet (50 mg total) by mouth every 6 (six) hours as needed.  . zolpidem (AMBIEN) 10 MG tablet Take 1 tablet (10 mg total) by mouth at bedtime.   No facility-administered encounter medications on file as of 05/04/2017.      SIGNIFICANT DIAGNOSTIC EXAMS   PREVIOUS:   01-10-17: chest x-ray:Stable cardiac prominence. Aortic atherosclerosis. No edema or consolidation. Aortic Atherosclerosis   01-10-17: ct of head and cervical spine:  CT head: Stable moderate diffuse atrophy, slightly more pronounced involving the cerebellum than elsewhere. 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 No acute infarct. There is patchy periventricular small vessel disease. No mass, hemorrhage, or extra-axial fluid collection. Areas present to the right of the sella, chronic and stable. There appears to be a connection between the posterior sphenoid sinus in this area, possibly residua of old trauma. There are areas of prior nasal trauma and trauma involving the medial left orbital wall. There are foci of paranasal sinus disease currently. There are  foci of arterial vascular calcification.  CT cervical spine: No fracture or spondylolisthesis. Multilevel osteoarthritic change present. There are foci of calcification in both carotid and subclavian arteries.  01-11-17:2- d echo:   - Left ventricle: The cavity size was normal. Wall thickness was increased in a pattern of moderate LVH. Systolic function was  normal. The estimated ejection fraction was in the range of 55% to 60%. Wall motion was normal; there were no regional wall motion abnormalities. The study is not technically sufficient to allow evaluation of LV diastolic function. - Mitral valve: There was mild regurgitation. - Right ventricle: The cavity size was mildly dilated. - Right atrium: The atrium was mildly dilated.  NO NEW EXAMS  LABS REVIEWED:  PREVIOUS:   01-10-17: wbc 13.1; hgb 14.5; hct 41.3; mcv 97.9; plt 300;glcuose 125; bun 14; creat 0.97; k+ 4.0; na++ 137; ca 8.6; liver normal albumin 3.2; tsh 1.506 01-11-17: keppra 25.4; drug screen: + opiates 01-12-17: wbc 8.2; hgb 13.1; hct 38.2; mcv 97.7; plt 274; mag 1.9 01-16-17: wbc 7.8; hgb 13.5; hct 39.3;mcv 97.5; plt 218; glucose 104; bun 11; creat 0.74; k+ 4.0; na++ 138; ca 8.6; liver normal albumin 2.9 01-18-17: wbc 8.1; hgb 13.8; hct 39.6; mcv 98.3; plt 208  02-08-17: sed rate 11 hgb a1c 5.9  02-16-17: wbc 8.2; hgb 14.3; hct 42.9; mcv 98; plt 176; glucose 97; bun 19; creat 0.92; k+ 5.3; na++ 147; ca 9.4 03-03-17: glucose 122; bun 15; creat 0.73; k+ 4.2; na++ 138; ca 8.7; liver normal albumin 3.7; ammonia 40  NO NEW LABS     Review of Systems  Constitutional: Negative for malaise/fatigue.  Respiratory: Negative for cough and shortness of breath.   Cardiovascular: Negative for chest pain, palpitations and leg swelling.  Gastrointestinal: Negative for abdominal pain, constipation and heartburn.  Musculoskeletal: Negative for back pain, joint pain and myalgias.  Skin: Negative.   Neurological: Negative for dizziness.    Psychiatric/Behavioral: The patient is not nervous/anxious.     Physical Exam  Constitutional: He is oriented to person, place, and time. He appears well-developed and well-nourished. No distress.  Neck: No thyromegaly present.  Cardiovascular: Normal rate, regular rhythm and intact distal pulses.  Murmur heard. 1/6  Pulmonary/Chest: Effort normal and breath sounds normal. No respiratory distress.  Abdominal: Soft. Bowel sounds are normal. He exhibits no distension. There is no tenderness.  Musculoskeletal: Normal range of motion. He exhibits no edema.  Using wheelchair   Lymphadenopathy:    He has no cervical adenopathy.  Neurological: He is alert and oriented to person, place, and time.  Skin: Skin is warm and dry. He is not diaphoretic.  Psychiatric: He has a normal mood and affect.       ASSESSMENT/ PLAN:  TODAY:   1. Seizures: no reports of further seizures:  Will continue vimpat 100 mg twice daily and keppra 1500 mg twice daily   2. Bipolar disorder (mixed moderate, currently active) has paranoia : is without change; does have a flight of idea present: will continue zoloft 100 mg daily; and seroquel xr 100 mg daily   3. Insomnia: stable will continue ambien 10 mg nightly and melatonin 3 mg nightly    4.  Chronic pain syndrome: chronic back pain: no change in status: will continue ultram 50 mg every 6 hours as needed for pain  And is taking tylenol 1000 mg three times daily   PREVIOUS  5.  Vascular dementia; no change in status: weight is 241 pounds; will not make changes at this time.   6. CVA: previous (bilateral frontal lobes): no change in status will monitor    7.  Smoker: he continues to smoke on a daily basis; at this time he is not interested in stopping smoking.   8. Atrial flutter with RVR:  Heart rate stable: is status post failure DVVC: is status post A-flutter ablation is off eliquis  9. Benign essential hypertension: b/p120/72: is stable is not on  medications; will not make changes will monitor  His lopressor and cardizem was stopped in the hospital   10. Chronic hepatitis C without hepatic coma: ( has chronic liver disease) no change in status: will continue  11. Migraine headache (is status post  cerebral aneurysm repair) stable will continue  imitrex 50  mg daily as needed may repeat in 2 hours as needed no more than 100 mg in 24 hours    MD is aware of resident's narcotic use and is in agreement with current plan of care. We will attempt to wean resident as apropriate   Synthia Innocent NP [facility]  Contact [phone] Monday through Friday 8am- 5pm  After hours call [phone]

## 2017-05-04 NOTE — Telephone Encounter (Signed)
Per [name] [name]-document into chart. Rx faxed to pharmacy.05/01/17

## 2017-05-05 ENCOUNTER — Telehealth: Payer: Self-pay | Admitting: Neurology

## 2017-05-05 ENCOUNTER — Institutional Professional Consult (permissible substitution): Payer: Medicaid Other | Admitting: Neurology

## 2017-05-05 NOTE — Telephone Encounter (Signed)
This patient came in without his co-pay, will have to be rescheduled.

## 2017-05-07 DIAGNOSIS — G4700 Insomnia, unspecified: Secondary | ICD-10-CM | POA: Insufficient documentation

## 2017-05-18 ENCOUNTER — Non-Acute Institutional Stay (SKILLED_NURSING_FACILITY): Payer: Medicaid Other | Admitting: Adult Health

## 2017-05-18 ENCOUNTER — Encounter: Payer: Self-pay | Admitting: Adult Health

## 2017-05-18 DIAGNOSIS — F22 Delusional disorders: Secondary | ICD-10-CM

## 2017-05-18 DIAGNOSIS — R569 Unspecified convulsions: Secondary | ICD-10-CM | POA: Diagnosis not present

## 2017-05-18 DIAGNOSIS — F3162 Bipolar disorder, current episode mixed, moderate: Secondary | ICD-10-CM | POA: Diagnosis not present

## 2017-05-18 NOTE — Progress Notes (Signed)
Location:  [facility] Room Number: 228 [phone] Place of Service:  SNF (31)   CODE STATUS: Full Code  Allergies  Allergen Reactions  . Carbamazepine Other (See Comments)    Hyponatremia  . Depakote [Divalproex Sodium] Other (See Comments)    [facility]    Chief Complaint  Patient presents with  . Acute Visit    Pt is being seen discuss multiple concerns.     HPI:  He has been more agitated with staff over the past several days. He is convinced that he is having seizures; but is not reporting them to the nursing staff. He tells me that he is not receiving his medications; however they are being given as ordered. He states that his hands are cramping him. He would like to have his tegretol back; did educated once again that he cannot tolerate this medication due to hyponatremia. He has verbalized understanding.    Past Medical History:  Diagnosis Date  . Aneurysm (HCC)   . Atrial fibrillation with RVR (HCC) 03/03/2017  . ETOHism (HCC)   . Gait abnormality 05/26/2016  . Hepatitis C   . 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 Hypertension   . Seizure [facility](HCC)     Past Surgical History:  Procedure Laterality Date  . A-FLUTTER ABLATION N/A 03/03/2017   Procedure: A-FLUTTER ABLATION;  Surgeon: Duke SalviaKlein, Steven C, MD;  [facility];  Service: Cardiovascular;  Laterality: N/A;  . ATRIAL FLUTTER ABLATION  03/03/2017  . CEREBRAL ANEURYSM REPAIR     At [facility]  . HARDWARE REMOVAL Left 10/29/2012   Procedure: HARDWARE REMOVAL;  Surgeon: Sheral Apleyimothy D Murphy, MD;  [facility];  Service: Orthopedics;  Laterality: Left;  . I&D EXTREMITY Left 10/26/2012   Procedure: IRRIGATION AND DEBRIDEMENT Left Elbow;  Surgeon: Sheral Apleyimothy D Murphy, MD;  Location: [facility] OR;  Service: Orthopedics;  Laterality: Left;  . I&D EXTREMITY Left 10/29/2012   Procedure: IRRIGATION AND DEBRIDEMENT EXTREMITY, wound vac change, stimulan beads;  Surgeon: Sheral Apleyimothy D  Murphy, MD;  Location: [facility] OR;  Service: Orthopedics;  Laterality: Left;  lateral on bean bag  . I&D EXTREMITY Left 11/02/2012   Procedure: IRRIGATION AND DEBRIDEMENT EXTREMITY;  Surgeon: Sheral Apleyimothy D Murphy, MD;  Location: [facility] OR;  Service: Orthopedics;  Laterality: Left;  . INCISION AND DRAINAGE ABSCESS Left 11/02/2012   Procedure: INCISION AND DRAINAGE ABSCESS;  Surgeon: Sheral Apleyimothy D Murphy, MD;  Location: [facility] OR;  Service: Orthopedics;  Laterality: Left;    Social History   Socioeconomic History  . Marital status: Single    Spouse name: Not on file  . Number of children: 3  . Years of education: 2911 th  . Highest education level: Not on file  Social Needs  . Financial resource strain: Not on file  . Food insecurity - worry: Not on file  . Food insecurity - inability: Not on file  . Transportation needs - medical: Not on file  . Transportation needs - non-medical: Not on file  Occupational History  . Occupation: disabled  Tobacco Use  . Smoking status: Current Every Day Smoker    Packs/day: 0.25    Types: Cigarettes  . Smokeless tobacco: Never Used  Substance and Sexual Activity  . Alcohol use: No    Alcohol/week: 0.0 oz    Comment: quit drinking 11/2011 per patient  . Drug use: No  . Sexual activity: Not Currently  Other Topics Concern  . Not on file  Social  History Narrative   He is currently a resident at [facility] facility    Family History  Problem Relation Age of Onset  . Hypertension Mother   . Cancer Mother   . Hypertension Father   . Diabetes Sister   . Cancer Sister   . Cancer Sister       VITAL SIGNS BP 129/65   Pulse 65   Temp (!) 97.3 F (36.3 C)   Ht 6\' 6"  (1.981 m)   Wt 231 lb (104.8 kg)   SpO2 96%   BMI 26.69 kg/m   Outpatient Encounter Medications as of 05/18/2017  Medication Sig  . acetaminophen (TYLENOL) 325 MG tablet Take 650 mg by mouth See admin instructions. Take 2 tablets (650 mg) by mouth three times daily  . Cholecalciferol (VITAMIN D3)  1000 units CAPS Take 1,000 Units by mouth daily.  Marland Kitchen guaiFENesin (ROBITUSSIN) 100 MG/5ML liquid Give 20 ml by mouth every 6 hours as need for cough and congestion  . Lacosamide (VIMPAT) 100 MG TABS Take 1 tablet (100 mg total) by mouth 2 (two) times daily.  Marland Kitchen latanoprost (XALATAN) 0.005 % ophthalmic solution Place 1 drop into both eyes at bedtime.  . levETIRAcetam (KEPPRA) 750 MG tablet Take 1,500 mg by mouth 2 (two) times daily.   . Melatonin 3 MG TABS Take 3 mg by mouth at bedtime.  Marland Kitchen QUEtiapine (SEROQUEL) 100 MG tablet Take 100 mg by mouth daily.   . sertraline (ZOLOFT) 100 MG tablet Take 100 mg by mouth daily.  . SUMAtriptan (IMITREX) 50 MG tablet Take 50 mg by mouth See admin instructions. Give 1 tablet (50 mg) by mouth every 24 hours as needed for migraine headache;  May repeat in 2 hours if headache persists or recurs. Do not exceed 100 mg in 24 hours  . traMADol (ULTRAM) 50 MG tablet Take 1 tablet (50 mg total) by mouth every 6 (six) hours as needed.  . zolpidem (AMBIEN) 10 MG tablet Take 1 tablet (10 mg total) by mouth at bedtime.  . [DISCONTINUED] codeine 15 MG tablet Take 15 mg by mouth every 8 (eight) hours. X 4 days  . [DISCONTINUED] guaiFENesin-codeine 100-10 MG/5ML syrup Take 15 mLs by mouth every 4 (four) hours. X 4 days  . [DISCONTINUED] ipratropium-albuterol (DUONEB) 0.5-2.5 (3) MG/3ML SOLN Take 3 mLs by nebulization every 4 (four) hours. X 2 days   No facility-administered encounter medications on file as of 05/18/2017.      SIGNIFICANT DIAGNOSTIC EXAMS  PREVIOUS:   01-10-17: chest x-ray:Stable cardiac prominence. Aortic atherosclerosis. No edema or consolidation. Aortic Atherosclerosis   01-10-17: ct of head and cervical spine:  CT head: Stable moderate diffuse atrophy, slightly more pronounced involving the cerebellum than elsewhere. 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 No acute infarct. There is patchy periventricular small vessel  disease. No mass, hemorrhage, or extra-axial fluid collection. Areas present to the right of the sella, chronic and stable. There appears to be a connection between the posterior sphenoid sinus in this area, possibly residua of old trauma. There are areas of prior nasal trauma and trauma involving the medial left orbital wall. There are foci of paranasal sinus disease currently. There are foci of arterial vascular calcification.  CT cervical spine: No fracture or spondylolisthesis. Multilevel osteoarthritic change present. There are foci of calcification in both carotid and subclavian arteries.  01-11-17:2- d echo:   - Left ventricle: The cavity size was normal. Wall thickness was increased  in a pattern of moderate LVH. Systolic function was  normal. The estimated ejection fraction was in the range of 55% to 60%. Wall motion was normal; there were no regional wall motion abnormalities. The study is not technically sufficient to allow evaluation of LV diastolic function. - Mitral valve: There was mild regurgitation. - Right ventricle: The cavity size was mildly dilated. - Right atrium: The atrium was mildly dilated.  NO NEW EXAMS   LABS REVIEWED:  PREVIOUS:   01-10-17: wbc 13.1; hgb 14.5; hct 41.3; mcv 97.9; plt 300;glcuose 125; bun 14; creat 0.97; k+ 4.0; na++ 137; ca 8.6; liver normal albumin 3.2; tsh 1.506 01-11-17: keppra 25.4; drug screen: + opiates 01-12-17: wbc 8.2; hgb 13.1; hct 38.2; mcv 97.7; plt 274; mag 1.9 01-16-17: wbc 7.8; hgb 13.5; hct 39.3;mcv 97.5; plt 218; glucose 104; bun 11; creat 0.74; k+ 4.0; na++ 138; ca 8.6; liver normal albumin 2.9 01-18-17: wbc 8.1; hgb 13.8; hct 39.6; mcv 98.3; plt 208  02-08-17: sed rate 11 hgb a1c 5.9  02-16-17: wbc 8.2; hgb 14.3; hct 42.9; mcv 98; plt 176; glucose 97; bun 19; creat 0.92; k+ 5.3; na++ 147; ca 9.4 03-03-17: glucose 122; bun 15; creat 0.73; k+ 4.2; na++ 138; ca 8.7; liver normal albumin 3.7; ammonia 40  NO NEW LABS     Review of  Systems  Constitutional: Negative for malaise/fatigue.  Respiratory: Negative for cough and shortness of breath.   Cardiovascular: Negative for chest pain, palpitations and leg swelling.  Gastrointestinal: Negative for abdominal pain, constipation and heartburn.  Musculoskeletal: Positive for joint pain. Negative for back pain and myalgias.       Bilateral hand cramping.   Skin: Negative.   Neurological: Positive for seizures. Negative for dizziness.  Psychiatric/Behavioral: The patient is not nervous/anxious.    .  Physical Exam  Constitutional: He is oriented to person, place, and time. He appears well-developed and well-nourished. No distress.  Neck: No thyromegaly present.  Cardiovascular: Normal rate, regular rhythm and intact distal pulses.  Murmur heard. 1/6  Pulmonary/Chest: Effort normal and breath sounds normal. No respiratory distress.  Abdominal: Soft. Bowel sounds are normal. He exhibits no distension. There is no tenderness.  Musculoskeletal: Normal range of motion. He exhibits no edema.  Lymphadenopathy:    He has no cervical adenopathy.  Neurological: He is alert and oriented to person, place, and time.  Skin: Skin is warm and dry. He is not diaphoretic.  Psychiatric:  Is easily agitated       ASSESSMENT/ PLAN:   1. Seizures: he reports seizure activity:  Will continue keppra 1500 mg twice daily will increase vimpat to 150 mg twice daily   2. Bipolar disorder (mixed moderate, currently active) has paranoia : is without change; does have a flight of idea present: will continue zoloft 100 mg daily; and  Will change seroquel xr to 200 mg daily and will monitor    MD is aware of resident's narcotic use and is in agreement with current plan of care. We will attempt to wean resident as apropriate   Synthia Innocent NP [facility]  Contact [phone] Monday through Friday 8am- 5pm  After hours call [phone]

## 2017-05-24 ENCOUNTER — Encounter: Payer: Self-pay | Admitting: Adult Health

## 2017-05-24 ENCOUNTER — Non-Acute Institutional Stay (SKILLED_NURSING_FACILITY): Payer: Medicaid Other | Admitting: Adult Health

## 2017-05-24 DIAGNOSIS — R569 Unspecified convulsions: Secondary | ICD-10-CM

## 2017-05-24 DIAGNOSIS — F22 Delusional disorders: Secondary | ICD-10-CM | POA: Diagnosis not present

## 2017-05-24 DIAGNOSIS — F3162 Bipolar disorder, current episode mixed, moderate: Secondary | ICD-10-CM | POA: Diagnosis not present

## 2017-05-24 NOTE — Progress Notes (Signed)
Location:   [facility] Room Number: 228 B Place of Service:  SNF (31)   CODE STATUS:  Full Code  Allergies  Allergen Reactions  . Carbamazepine Other (See Comments)    Hyponatremia  . Depakote [Divalproex Sodium] Other (See Comments)    Unknown - on [facility]    Chief Complaint  Patient presents with  . Acute Visit    Care Plan Meeting    HPI:  He has been accepted by an [name] in [place]. We have had a prolonged discussion about his goals of care. He is wanting to be more independent and would like to be in his own apartment. He willing to go to assisted living; but feels as though he needs to consider his options. He denies any feelings of agitation; denies any back pain; denies any weakness in his legs. There are no nursing concerns at this time.   Past Medical History:  Diagnosis Date  . Aneurysm (HCC)   . Atrial fibrillation with RVR (HCC) 03/03/2017  . ETOHism (HCC)   . Gait abnormality 05/26/2016  . Hepatitis C   . 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 Hypertension   . Seizure [facility])     Past Surgical History:  Procedure Laterality Date  . A-FLUTTER ABLATION N/A 03/03/2017   Procedure: A-FLUTTER ABLATION;  Surgeon: Duke Salvia, MD;  Location: [facility] INVASIVE CV LAB;  Service: Cardiovascular;  Laterality: N/A;  . ATRIAL FLUTTER ABLATION  03/03/2017  . CEREBRAL ANEURYSM REPAIR     At [facility]  . HARDWARE REMOVAL Left 10/29/2012   Procedure: HARDWARE REMOVAL;  Surgeon: Sheral Apley, MD;  Location: [facility] OR;  Service: Orthopedics;  Laterality: Left;  . I&D EXTREMITY Left 10/26/2012   Procedure: IRRIGATION AND DEBRIDEMENT Left Elbow;  Surgeon: Sheral Apley, MD;  Location: [facility] OR;  Service: Orthopedics;  Laterality: Left;  . I&D EXTREMITY Left 10/29/2012   Procedure: IRRIGATION AND DEBRIDEMENT EXTREMITY, wound vac change, stimulan beads;  Surgeon: Sheral Apley, MD;  Location: [facility] OR;  Service: Orthopedics;  Laterality:  Left;  lateral on bean bag  . I&D EXTREMITY Left 11/02/2012   Procedure: IRRIGATION AND DEBRIDEMENT EXTREMITY;  Surgeon: Sheral Apley, MD;  Location: [facility] OR;  Service: Orthopedics;  Laterality: Left;  . INCISION AND DRAINAGE ABSCESS Left 11/02/2012   Procedure: INCISION AND DRAINAGE ABSCESS;  Surgeon: Sheral Apley, MD;  Location: [facility] OR;  Service: Orthopedics;  Laterality: Left;    Social History   Socioeconomic History  . Marital status: Single    Spouse name: Not on file  . Number of children: 3  . Years of education: 25 th  . Highest education level: Not on file  Social Needs  . Financial resource strain: Not on file  . Food insecurity - worry: Not on file  . Food insecurity - inability: Not on file  . Transportation needs - medical: Not on file  . Transportation needs - non-medical: Not on file  Occupational History  . Occupation: disabled  Tobacco Use  . Smoking status: Current Every Day Smoker    Packs/day: 0.25    Types: Cigarettes  . Smokeless tobacco: Never Used  Substance and Sexual Activity  . Alcohol use: No    Alcohol/week: 0.0 oz    Comment: quit drinking 11/2011 per patient  . Drug use: No  . Sexual activity: Not Currently  Other Topics Concern  . Not on file  Social History Narrative   He  is currently a resident at [facility] facility    Family History  Problem Relation Age of Onset  . Hypertension Mother   . Cancer Mother   . Hypertension Father   . Diabetes Sister   . Cancer Sister   . Cancer Sister       VITAL SIGNS BP 129/65   Pulse 65   Temp (!) 97.3 F (36.3 C)   Resp 18   Ht 6\' 6"  (1.981 m)   Wt 231 lb (104.8 kg)   SpO2 96%   BMI 26.69 kg/m   Outpatient Encounter Medications as of 05/24/2017  Medication Sig  . acetaminophen (TYLENOL) 325 MG tablet Take 650 mg by mouth See admin instructions. Take 2 tablets (650 mg) by mouth three times daily  . Cholecalciferol (VITAMIN D3) 1000 units CAPS Take 1,000 Units by mouth daily.  Marland Kitchen  guaiFENesin (ROBITUSSIN) 100 MG/5ML liquid Give 20 ml by mouth every 6 hours as need for cough and congestion  . Lacosamide (VIMPAT) 100 MG TABS Take 1 tablet (100 mg total) by mouth 2 (two) times daily.  Marland Kitchen latanoprost (XALATAN) 0.005 % ophthalmic solution Place 1 drop into both eyes at bedtime.  . levETIRAcetam (KEPPRA) 750 MG tablet Take 1,500 mg by mouth 2 (two) times daily.   . Melatonin 3 MG TABS Take 3 mg by mouth at bedtime.  Marland Kitchen QUEtiapine (SEROQUEL) 100 MG tablet Take 100 mg by mouth daily.   . sertraline (ZOLOFT) 100 MG tablet Take 100 mg by mouth daily.  . SUMAtriptan (IMITREX) 50 MG tablet Take 50 mg by mouth See admin instructions. Give 1 tablet (50 mg) by mouth every 24 hours as needed for migraine headache;  May repeat in 2 hours if headache persists or recurs. Do not exceed 100 mg in 24 hours  . traMADol (ULTRAM) 50 MG tablet Take 1 tablet (50 mg total) by mouth every 6 (six) hours as needed.  . zolpidem (AMBIEN) 10 MG tablet Take 1 tablet (10 mg total) by mouth at bedtime.   No facility-administered encounter medications on file as of 05/24/2017.      SIGNIFICANT DIAGNOSTIC EXAMS  PREVIOUS:   01-10-17: chest x-ray:Stable cardiac prominence. Aortic atherosclerosis. No edema or consolidation. Aortic Atherosclerosis   01-10-17: ct of head and cervical spine:  CT head: Stable moderate diffuse atrophy, slightly more pronounced involving the cerebellum than elsewhere. 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 No acute infarct. There is patchy periventricular small vessel disease. No mass, hemorrhage, or extra-axial fluid collection. Areas present to the right of the sella, chronic and stable. There appears to be a connection between the posterior sphenoid sinus in this area, possibly residua of old trauma. There are areas of prior nasal trauma and trauma involving the medial left orbital wall. There are foci of paranasal sinus disease currently. There  are foci of arterial vascular calcification.  CT cervical spine: No fracture or spondylolisthesis. Multilevel osteoarthritic change present. There are foci of calcification in both carotid and subclavian arteries.  01-11-17:2- d echo:   - Left ventricle: The cavity size was normal. Wall thickness was increased in a pattern of moderate LVH. Systolic function was  normal. The estimated ejection fraction was in the range of 55% to 60%. Wall motion was normal; there were no regional wall motion abnormalities. The study is not technically sufficient to allow evaluation of LV diastolic function. - Mitral valve: There was mild regurgitation. - Right ventricle: The cavity size  was mildly dilated. - Right atrium: The atrium was mildly dilated.  NO NEW EXAMS   LABS REVIEWED:  PREVIOUS:   01-10-17: wbc 13.1; hgb 14.5; hct 41.3; mcv 97.9; plt 300;glcuose 125; bun 14; creat 0.97; k+ 4.0; na++ 137; ca 8.6; liver normal albumin 3.2; tsh 1.506 01-11-17: keppra 25.4; drug screen: + opiates 01-12-17: wbc 8.2; hgb 13.1; hct 38.2; mcv 97.7; plt 274; mag 1.9 01-16-17: wbc 7.8; hgb 13.5; hct 39.3;mcv 97.5; plt 218; glucose 104; bun 11; creat 0.74; k+ 4.0; na++ 138; ca 8.6; liver normal albumin 2.9 01-18-17: wbc 8.1; hgb 13.8; hct 39.6; mcv 98.3; plt 208  02-08-17: sed rate 11 hgb a1c 5.9  02-16-17: wbc 8.2; hgb 14.3; hct 42.9; mcv 98; plt 176; glucose 97; bun 19; creat 0.92; k+ 5.3; na++ 147; ca 9.4 03-03-17: glucose 122; bun 15; creat 0.73; k+ 4.2; na++ 138; ca 8.7; liver normal albumin 3.7; ammonia 40  NO NEW LABS      Review of Systems  Constitutional: Negative for malaise/fatigue.  Respiratory: Negative for cough and shortness of breath.   Cardiovascular: Negative for chest pain, palpitations and leg swelling.  Gastrointestinal: Negative for abdominal pain, constipation and heartburn.  Musculoskeletal: Negative for back pain, joint pain and myalgias.  Skin: Negative.   Neurological: Negative for  dizziness.  Psychiatric/Behavioral: The patient is not nervous/anxious.     Physical Exam  Constitutional: He is oriented to person, place, and time. He appears well-developed and well-nourished. No distress.  Neck: No thyromegaly present.  Cardiovascular: Normal rate, regular rhythm and intact distal pulses.  Murmur heard. 1/6  Pulmonary/Chest: Effort normal and breath sounds normal. No respiratory distress.  Abdominal: Soft. Bowel sounds are normal. He exhibits no distension.  Musculoskeletal: Normal range of motion. He exhibits no edema.  Lymphadenopathy:    He has no cervical adenopathy.  Neurological: He is alert and oriented to person, place, and time.  Skin: Skin is warm and dry. He is not diaphoretic.  Psychiatric: He has a normal mood and affect.    ASSESSMENT/ PLAN: 1. Seizures:  2. Bipolar disorder (mixed moderate, currently active) has paranoia :   At this time will continue his current plan of care and will continue his current medication regimen.    Time spent with patient: 45 minutes: discussed goals of care; and future expectations he will consider his future treatment options including discharging to assisted living.  Verbalized understanding.    MD is aware of resident's narcotic use and is in agreement with current plan of care. We will attempt to wean resident as apropriate    Synthia Innocenteborah Green NP [facility]  Contact [phone] Monday through Friday 8am- 5pm  After hours call [phone]

## 2017-05-26 ENCOUNTER — Other Ambulatory Visit: Payer: Self-pay

## 2017-05-26 ENCOUNTER — Non-Acute Institutional Stay (SKILLED_NURSING_FACILITY): Payer: Medicaid Other | Admitting: Adult Health

## 2017-05-26 ENCOUNTER — Encounter: Payer: Self-pay | Admitting: Adult Health

## 2017-05-26 DIAGNOSIS — G894 Chronic pain syndrome: Secondary | ICD-10-CM

## 2017-05-26 DIAGNOSIS — F3162 Bipolar disorder, current episode mixed, moderate: Secondary | ICD-10-CM

## 2017-05-26 DIAGNOSIS — I4892 Unspecified atrial flutter: Secondary | ICD-10-CM

## 2017-05-26 MED ORDER — LACOSAMIDE 150 MG PO TABS: ORAL_TABLET | ORAL | 0 refills | Status: DC

## 2017-05-26 MED ORDER — TRAMADOL HCL 50 MG PO TABS: 50.0000 mg | ORAL_TABLET | Freq: Four times a day (QID) | ORAL | 0 refills | Status: DC | PRN

## 2017-05-26 MED ORDER — ZOLPIDEM TARTRATE 10 MG PO TABS: 10.0000 mg | ORAL_TABLET | Freq: Every day | ORAL | 0 refills | Status: DC

## 2017-05-26 NOTE — Progress Notes (Signed)
Location:   [facility] Room Number: 228 B Place of Service:  SNF (31)    CODE STATUS: Full Code  Allergies  Allergen Reactions  . Carbamazepine Other (See Comments)    Hyponatremia  . Depakote [Divalproex Sodium] Other (See Comments)    Unknown - on [facility]    Chief Complaint  Patient presents with  . Discharge Note    Discharging to [name] in [place], [place]    HPI:  He is being discharged to assisted living; as he no longer requires skilled nursing. He will need home health for pt/ot. He will need a standard wheelchair. He will need his prescriptions written and will follow up with the medical provider at the receiving facility He had been hospitalized originally for atrial flutter with rvr; and did eventually have an ablation performed.    Past Medical History:  Diagnosis Date  . Aneurysm (HCC)   . Atrial fibrillation with RVR (HCC) 03/03/2017  . ETOHism (HCC)   . Gait abnormality 05/26/2016  . Hepatitis C   . 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 Hypertension   . Seizure [facility])     Past Surgical History:  Procedure Laterality Date  . A-FLUTTER ABLATION N/A 03/03/2017   Procedure: A-FLUTTER ABLATION;  Surgeon: Duke Salvia, MD;  Location: [facility] INVASIVE CV LAB;  Service: Cardiovascular;  Laterality: N/A;  . ATRIAL FLUTTER ABLATION  03/03/2017  . CEREBRAL ANEURYSM REPAIR     At [facility]  . HARDWARE REMOVAL Left 10/29/2012   Procedure: HARDWARE REMOVAL;  Surgeon: Sheral Apley, MD;  Location: [facility] OR;  Service: Orthopedics;  Laterality: Left;  . I&D EXTREMITY Left 10/26/2012   Procedure: IRRIGATION AND DEBRIDEMENT Left Elbow;  Surgeon: Sheral Apley, MD;  Location: [facility] OR;  Service: Orthopedics;  Laterality: Left;  . I&D EXTREMITY Left 10/29/2012   Procedure: IRRIGATION AND DEBRIDEMENT EXTREMITY, wound vac change, stimulan beads;  Surgeon: Sheral Apley, MD;  Location: [facility] OR;  Service: Orthopedics;  Laterality: Left;  lateral  on bean bag  . I&D EXTREMITY Left 11/02/2012   Procedure: IRRIGATION AND DEBRIDEMENT EXTREMITY;  Surgeon: Sheral Apley, MD;  Location: [facility] OR;  Service: Orthopedics;  Laterality: Left;  . INCISION AND DRAINAGE ABSCESS Left 11/02/2012   Procedure: INCISION AND DRAINAGE ABSCESS;  Surgeon: Sheral Apley, MD;  Location: [facility] OR;  Service: Orthopedics;  Laterality: Left;    Social History   Socioeconomic History  . Marital status: Single    Spouse name: Not on file  . Number of children: 3  . Years of education: 34 th  . Highest education level: Not on file  Social Needs  . Financial resource strain: Not on file  . Food insecurity - worry: Not on file  . Food insecurity - inability: Not on file  . Transportation needs - medical: Not on file  . Transportation needs - non-medical: Not on file  Occupational History  . Occupation: disabled  Tobacco Use  . Smoking status: Current Every Day Smoker    Packs/day: 0.25    Types: Cigarettes  . Smokeless tobacco: Never Used  Substance and Sexual Activity  . Alcohol use: No    Alcohol/week: 0.0 oz    Comment: quit drinking 11/2011 per patient  . Drug use: No  . Sexual activity: Not Currently  Other Topics Concern  . Not on file  Social History Narrative   He is currently a resident at [facility] facility    Family History  Problem Relation Age of Onset  . Hypertension Mother   . Cancer Mother   . Hypertension Father   . Diabetes Sister   . Cancer Sister   . Cancer Sister     VITAL SIGNS BP 129/65   Pulse 65   Temp (!) 97.3 F (36.3 C)   Resp 18   Ht 6\' 6"  (1.981 m)   Wt 231 lb (104.8 kg)   SpO2 96%   BMI 26.69 kg/m   Patient's Medications  New Prescriptions   No medications on file  Previous Medications   ACETAMINOPHEN (TYLENOL) 500 MG TABLET    Take 1,000 mg by mouth 3 (three) times daily. Take 2 tablets (650 mg) by mouth three times daily   CHOLECALCIFEROL (VITAMIN D3) 1000 UNITS CAPS    Take 1,000 Units by mouth  daily.   GUAIFENESIN (ROBITUSSIN) 100 MG/5ML LIQUID    Give 20 ml by mouth every 6 hours as need for cough and congestion   LATANOPROST (XALATAN) 0.005 % OPHTHALMIC SOLUTION    Place 1 drop into both eyes at bedtime.   LEVETIRACETAM (KEPPRA) 750 MG TABLET    Take 1,500 mg by mouth 2 (two) times daily.    MELATONIN 3 MG TABS    Take 3 mg by mouth at bedtime.   QUETIAPINE (SEROQUEL) 200 MG TABLET    Take 200 mg by mouth daily.    SERTRALINE (ZOLOFT) 50 MG TABLET    Take 50 mg by mouth daily.    SUMATRIPTAN (IMITREX) 50 MG TABLET    Take 50 mg by mouth See admin instructions. Give 1 tablet (50 mg) by mouth every 24 hours as needed for migraine headache;  May repeat in 2 hours if headache persists or recurs. Do not exceed 100 mg in 24 hours   TRAMADOL (ULTRAM) 50 MG TABLET    Take 1 tablet (50 mg total) by mouth every 6 (six) hours as needed.   ZOLPIDEM (AMBIEN) 10 MG TABLET    Take 1 tablet (10 mg total) by mouth at bedtime.  Modified Medications   No medications on file  Discontinued Medications   LACOSAMIDE (VIMPAT) 100 MG TABS    Take 1 tablet (100 mg total) by mouth 2 (two) times daily.     SIGNIFICANT DIAGNOSTIC EXAMS  PREVIOUS:   01-10-17: chest x-ray:Stable cardiac prominence. Aortic atherosclerosis. No edema or consolidation. Aortic Atherosclerosis   01-10-17: ct of head and cervical spine:  CT head: Stable moderate diffuse atrophy, slightly more pronounced involving the cerebellum than elsewhere. 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 No acute infarct. There is patchy periventricular small vessel disease. No mass, hemorrhage, or extra-axial fluid collection. Areas present to the right of the sella, chronic and stable. There appears to be a connection between the posterior sphenoid sinus in this area, possibly residua of old trauma. There are areas of prior nasal trauma and trauma involving the medial left orbital wall. There are foci of paranasal  sinus disease currently. There are foci of arterial vascular calcification.  CT cervical spine: No fracture or spondylolisthesis. Multilevel osteoarthritic change present. There are foci of calcification in both carotid and subclavian arteries.  01-11-17:2- d echo:   - Left ventricle: The cavity size was normal. Wall thickness was increased in a pattern of moderate LVH. Systolic function was  normal. The estimated ejection fraction was in the range of 55% to 60%. Wall motion was normal; there were no regional wall  motion abnormalities. The study is not technically sufficient to allow evaluation of LV diastolic function. - Mitral valve: There was mild regurgitation. - Right ventricle: The cavity size was mildly dilated. - Right atrium: The atrium was mildly dilated.  NO NEW EXAMS   LABS REVIEWED:  PREVIOUS:   01-10-17: wbc 13.1; hgb 14.5; hct 41.3; mcv 97.9; plt 300;glcuose 125; bun 14; creat 0.97; k+ 4.0; na++ 137; ca 8.6; liver normal albumin 3.2; tsh 1.506 01-11-17: keppra 25.4; drug screen: + opiates 01-12-17: wbc 8.2; hgb 13.1; hct 38.2; mcv 97.7; plt 274; mag 1.9 01-16-17: wbc 7.8; hgb 13.5; hct 39.3;mcv 97.5; plt 218; glucose 104; bun 11; creat 0.74; k+ 4.0; na++ 138; ca 8.6; liver normal albumin 2.9 01-18-17: wbc 8.1; hgb 13.8; hct 39.6; mcv 98.3; plt 208  02-08-17: sed rate 11 hgb a1c 5.9  02-16-17: wbc 8.2; hgb 14.3; hct 42.9; mcv 98; plt 176; glucose 97; bun 19; creat 0.92; k+ 5.3; na++ 147; ca 9.4 03-03-17: glucose 122; bun 15; creat 0.73; k+ 4.2; na++ 138; ca 8.7; liver normal albumin 3.7; ammonia 40  NO NEW LABS      Review of Systems  Constitutional: Negative for malaise/fatigue.  Respiratory: Negative for cough and shortness of breath.   Cardiovascular: Negative for chest pain, palpitations and leg swelling.  Gastrointestinal: Negative for abdominal pain, constipation and heartburn.  Musculoskeletal: Negative for back pain, joint pain and myalgias.  Skin: Negative.     Neurological: Negative for dizziness.  Psychiatric/Behavioral: The patient is not nervous/anxious.        Physical Exam  Constitutional: He is oriented to person, place, and time. He appears well-developed and well-nourished. No distress.  Neck: No thyromegaly present.  Cardiovascular: Normal rate, regular rhythm and intact distal pulses.  1/6  Pulmonary/Chest: Effort normal and breath sounds normal. No respiratory distress.  Abdominal: Soft. Bowel sounds are normal.  Musculoskeletal: Normal range of motion. He exhibits no edema.  Lymphadenopathy:    He has no cervical adenopathy.  Neurological: He is alert and oriented to person, place, and time.  Skin: Skin is warm and dry. He is not diaphoretic.  Psychiatric: He has a normal mood and affect.    ASSESSMENT/ PLAN:  Patient is being discharged with the following home health services:  Pt/ot: to evaluate and treat as indicated for gait balance strength adl training   Patient is being discharged with the following durable medical equipment:  Standard wheelchair with cushion; antitippers; leg rests; brake extensions to allow resident to maintain current level of independence with adls which cannot be achieved with a walker; can self propel.   Patient has been advised to f/u with their PCP in 1-2 weeks to bring them up to date on their rehab stay.  Social services at facility was responsible for arranging this appointment.  Pt was provided with a 30 day supply of prescriptions for medications and refills must be obtained from their PCP.  For controlled substances, a more limited supply may be provided adequate until PCP appointment only.  30 day supply of prescription medications per the list above #30 ambien 10 mg tabs  #30 ultram 50 mg tabs #60 vimpat 150 mg tabs   Time spent with patient: 45 minutes: discussed; discharge plans; goals of care; medications; and expectations upon discharge. Did verbalize understanding.    Synthia Innocent NP [facility]  Contact [phone] Monday through Friday 8am- 5pm  After hours call [phone]

## 2017-05-26 NOTE — Telephone Encounter (Signed)
Discharged with patient 

## 2017-05-30 ENCOUNTER — Telehealth: Payer: Self-pay | Admitting: *Deleted

## 2017-05-30 ENCOUNTER — Ambulatory Visit: Payer: Medicaid Other | Admitting: Adult Health

## 2017-05-30 ENCOUNTER — Encounter: Payer: Self-pay | Admitting: Adult Health

## 2017-05-30 NOTE — Telephone Encounter (Signed)
Patient was no show for follow up with NP today.  

## 2017-06-26 ENCOUNTER — Emergency Department (HOSPITAL_COMMUNITY)
Admission: EM | Admit: 2017-06-26 | Discharge: 2017-06-26 | Disposition: A | Discharge status: Home / Self Care | Payer: Medicaid Other | Attending: Emergency Medicine | Admitting: Emergency Medicine

## 2017-06-26 ENCOUNTER — Encounter (HOSPITAL_COMMUNITY): Payer: Self-pay | Admitting: *Deleted

## 2017-06-26 ENCOUNTER — Other Ambulatory Visit: Payer: Self-pay

## 2017-06-26 DIAGNOSIS — Z79899 Other long term (current) drug therapy: Secondary | ICD-10-CM | POA: Diagnosis not present

## 2017-06-26 DIAGNOSIS — Z760 Encounter for issue of repeat prescription: Secondary | ICD-10-CM | POA: Diagnosis present

## 2017-06-26 DIAGNOSIS — F17210 Nicotine dependence, cigarettes, uncomplicated: Secondary | ICD-10-CM | POA: Diagnosis not present

## 2017-06-26 DIAGNOSIS — I10 Essential (primary) hypertension: Secondary | ICD-10-CM | POA: Insufficient documentation

## 2017-06-26 MED ORDER — SERTRALINE HCL 50 MG PO TABS: 50.0000 mg | ORAL_TABLET | Freq: Every day | ORAL | 0 refills | Status: DC

## 2017-06-26 MED ORDER — LACOSAMIDE 150 MG PO TABS: ORAL_TABLET | ORAL | 0 refills | Status: DC

## 2017-06-26 MED ORDER — CARBAMAZEPINE 200 MG PO TABS: ORAL_TABLET | ORAL | 0 refills | Status: DC

## 2017-06-26 MED ORDER — SUMATRIPTAN SUCCINATE 50 MG PO TABS: 50.0000 mg | ORAL_TABLET | ORAL | 0 refills | Status: AC

## 2017-06-26 MED ORDER — LEVETIRACETAM 750 MG PO TABS: 1500.0000 mg | ORAL_TABLET | Freq: Two times a day (BID) | ORAL | 0 refills | Status: DC

## 2017-06-26 MED ORDER — QUETIAPINE FUMARATE 200 MG PO TABS: 200.0000 mg | ORAL_TABLET | Freq: Every day | ORAL | 0 refills | Status: DC

## 2017-06-26 NOTE — ED Notes (Signed)
Caseworkers have been in to see PT

## 2017-06-26 NOTE — ED Triage Notes (Signed)
Pt reports recent getting out of rehab facility on Friday. Pt reports per social security office needs to get medical clearance for placement in new apartment.

## 2017-06-26 NOTE — Discharge Instructions (Signed)
Your medications have been refilled. We do not refill narcotic or controlled substance prescriptions. Follow up with appointment at [place] [facility].

## 2017-06-26 NOTE — Progress Notes (Signed)
CSW and CM met with pt in pt's room. Pt informed staff that he recently left a facility near [place]. Pt returned to [place] on Friday. When asked where he has been staying, pt did not answer, but stated his clothes are at his sister's house. Pt informed CSW and CM that he is looking to have his medication refilled and a letter completed for DSS to become his own payee again. CSW and CM explained to pt that they will check to see what medication can be refilled, but he needs to establish care with a PCP to continue refilling those medications. Explained to pt that he needs to see a PCP to have them write the letter to DSS. Provided pt with referrals to the clinics in [place].   Wendelyn Breslow, [facility]  [phone]

## 2017-06-26 NOTE — ED Provider Notes (Signed)
[facility] EMERGENCY DEPARTMENT Provider Note   CSN: [id number] Arrival date & time: 06/26/17  1148     History   Chief Complaint Chief Complaint  Patient presents with  . Medical Clearance    HPI [name] [name] is a 57 y.o. male w/ h/o ETOH abuse, HTN, atrial flutter, tobacco abuse, seizures, cerebral aneurysm s/p repair here requesting a letter from a doctor stating he is of "sound mind" to live independently in an apartment. States he recently left a rehab facility near [place] because he did not like it, went to social security services today in [place] where representative told him he needed medical clearance. Pt shows me paperwork from rehab facility "notice of discharge" where it specifies patient being discharged for "smoking in room; aggressive behavior towards other". Patient adamantly denies this and states it was a mistake. He is also requesting medication refills.   He has no physical complains including CP, cough, sob, fevers. Denies SI, HI, AVH.   HPI  Past Medical History:  Diagnosis Date  . Aneurysm ([place])   . Atrial fibrillation with RVR ([place]) 03/03/2017  . ETOHism ([place])   . Gait abnormality 05/26/2016  . Hepatitis C   . 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 Hypertension   . Seizure [facility])     Patient Active Problem List   Diagnosis Date Noted  . Insomnia 05/07/2017  . Atrial flutter ([place]) 03/03/2017  . Late effect of cerebrovascular accident (CVA) 01/21/2017  . Bipolar disorder ([place]) 01/21/2017  . Migraine headache 01/21/2017  . Atrial flutter with rapid ventricular response ([place]) 01/10/2017  . Syncope 01/10/2017  . Gait abnormality 05/26/2016  . Smoking 01/05/2015  . Chronic pain syndrome 01/05/2015  . Fall   . Paranoia ([place])   . Dementia with behavioral disturbance   . Encephalopathy, hepatic ([place]) 10/09/2014  . 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 Seizures ([place]) 01/24/2014  .  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/23/2014  . Essential hypertension, benign 10/31/2012  . History of alcohol abuse 10/26/2012  . Chronic liver disease 10/26/2012    Past Surgical History:  Procedure Laterality Date  . A-FLUTTER ABLATION N/A 03/03/2017   Procedure: A-FLUTTER ABLATION;  Surgeon: Deboraha Sprang, MD;  Location: [facility] CV LAB;  Service: Cardiovascular;  Laterality: N/A;  . ATRIAL FLUTTER ABLATION  03/03/2017  . CEREBRAL ANEURYSM REPAIR     At [place] Left 10/29/2012   Procedure: HARDWARE REMOVAL;  Surgeon: Renette Butters, MD;  Location: [facility];  Service: Orthopedics;  Laterality: Left;  . I&D EXTREMITY Left 10/26/2012   Procedure: IRRIGATION AND DEBRIDEMENT Left Elbow;  Surgeon: Renette Butters, MD;  Location: [facility];  Service: Orthopedics;  Laterality: Left;  . I&D EXTREMITY Left 10/29/2012   Procedure: IRRIGATION AND DEBRIDEMENT EXTREMITY, wound vac change, stimulan beads;  Surgeon: Renette Butters, MD;  Location: [facility];  Service: Orthopedics;  Laterality: Left;  lateral on bean bag  . I&D EXTREMITY Left 11/02/2012   Procedure: IRRIGATION AND DEBRIDEMENT EXTREMITY;  Surgeon: Renette Butters, MD;  Location: [facility];  Service: Orthopedics;  Laterality: Left;  . INCISION AND DRAINAGE ABSCESS Left 11/02/2012   Procedure: INCISION AND DRAINAGE ABSCESS;  Surgeon: Renette Butters, MD;  Location: [facility];  Service: Orthopedics;  Laterality: Left;        Home Medications    Prior to Admission medications   Medication Sig Start Date End Date Taking? Authorizing Provider  acetaminophen (  TYLENOL) 500 MG tablet Take 1,000 mg by mouth 3 (three) times daily. Take 2 tablets (650 mg) by mouth three times daily 05/20/17   [provider]  carbamazepine (TEGRETOL) 200 MG tablet 853m PO QD X 1D, then 6072mPO QD X 1D, then 40073mD X 1D, then 200m39m QD X 2D 06/26/17   GibbKinnie Feil-C  Cholecalciferol (VITAMIN D3) 1000 units CAPS Take 1,000 Units  by mouth daily.    [provider]  guaiFENesin (ROBITUSSIN) 100 MG/5ML liquid Give 20 ml by mouth every 6 hours as need for cough and congestion 04/11/17   [provider]  Lacosamide 150 MG TABS Give 1 tablet by mouth two times daily 06/26/17   GibbKinnie Feil-C  latanoprost (XALATAN) 0.005 % ophthalmic solution Place 1 drop into both eyes at bedtime.    [provider]  levETIRAcetam (KEPPRA) 750 MG tablet Take 2 tablets (1,500 mg total) by mouth 2 (two) times daily. 06/26/17   GibbKinnie Feil-C  Melatonin 3 MG TABS Take 3 mg by mouth at bedtime.    [provider]  QUEtiapine (SEROQUEL) 200 MG tablet Take 1 tablet (200 mg total) by mouth daily. 06/26/17   GibbKinnie Feil-C  sertraline (ZOLOFT) 50 MG tablet Take 1 tablet (50 mg total) by mouth daily. 06/26/17   GibbKinnie Feil-C  SUMAtriptan (IMITREX) 50 MG tablet Take 1 tablet (50 mg total) by mouth See admin instructions. Give 1 tablet (50 mg) by mouth every 24 hours as needed for migraine headache;  May repeat in 2 hours if headache persists or recurs. Do not exceed 100 mg in 24 hours 06/26/17   GibbKinnie Feil-C  traMADol (ULTRAM) 50 MG tablet Take 1 tablet (50 mg total) by mouth every 6 (six) hours as needed. 05/26/17   GreeGerlene Fee  zolpidem (AMBIEN) 10 MG tablet Take 1 tablet (10 mg total) by mouth at bedtime. 05/26/17   GreeGerlene Fee    Family History Family History  Problem Relation Age of Onset  . Hypertension Mother   . Cancer Mother   . Hypertension Father   . Diabetes Sister   . Cancer Sister   . Cancer Sister     Social History Social History   Tobacco Use  . Smoking status: Current Every Day Smoker    Packs/day: 0.25    Types: Cigarettes  . Smokeless tobacco: Never Used  Substance Use Topics  . Alcohol use: No    Alcohol/week: 0.0 oz    Comment: quit drinking 11/2011 per patient  . Drug use: No     Allergies   Carbamazepine and Depakote  [divalproex sodium]   Review of Systems Review of Systems  All other systems reviewed and are negative.    Physical Exam Updated Vital Signs BP 130/85 (BP Location: Right Arm)   Pulse (!) 116   Temp 98 F (36.7 C) (Oral)   Resp 16   SpO2 98%   Physical Exam  Constitutional: He is oriented to person, place, and time. He appears well-developed and well-nourished. No distress.  NAD.  HENT:  Head: Normocephalic and atraumatic.  Right Ear: External ear normal.  Left Ear: External ear normal.  Nose: Nose normal.  Eyes: Conjunctivae and EOM are normal. No scleral icterus.  Neck: Normal range of motion. Neck supple.  Cardiovascular: Normal heart sounds and intact distal pulses.  Tachycardic low 100s. Irregular rhythm, auscultated and palpated.  Pulmonary/Chest: Effort normal and breath sounds normal.  Musculoskeletal: Normal range of motion. He exhibits no deformity.  Neurological: He is alert and oriented to person, place, and time.  Skin: Skin is warm and dry. Capillary refill takes less than 2 seconds.  Psychiatric: He has a normal mood and affect. His behavior is normal. Judgment and thought content normal.  Nursing note and vitals reviewed.    ED Treatments / Results  Labs (all labs ordered are listed, but only abnormal results are displayed) Labs Reviewed - No data to display  EKG None  Radiology No results found.  Procedures Procedures (including critical care time)  Medications Ordered in ED Medications - No data to display   Initial Impression / Assessment and Plan / ED Course  I have reviewed the triage vital signs and the nursing notes.  Pertinent labs & imaging results that were available during my care of the patient were reviewed by me and considered in my medical decision making (see chart for details).    Spoke to case management who met with patient. Mariann Laster has provided patient with outpatient resources and referred to [facility].  He has no physical concerns and denies SI, HI, AVH to suggest acute psych decompensation or need for emergent psych evaluation. Non narcotic/controlled substance medications refilled today. He is deemed appropriate for discharge at this time.   Final Clinical Impressions(s) / ED Diagnoses   Final diagnoses:  Medication refill    ED Discharge Orders        Ordered    levETIRAcetam (KEPPRA) 750 MG tablet  2 times daily     06/26/17 1701    QUEtiapine (SEROQUEL) 200 MG tablet  Daily     06/26/17 1701    sertraline (ZOLOFT) 50 MG tablet  Daily     06/26/17 1701    SUMAtriptan (IMITREX) 50 MG tablet  See admin instructions     06/26/17 1701    carbamazepine (TEGRETOL) 200 MG tablet     06/26/17 1701    Lacosamide 150 MG TABS     06/26/17 1701       Arlean Hopping 06/26/17 2106    Jola Schmidt, MD 06/27/17 [phone]

## 2017-06-29 ENCOUNTER — Other Ambulatory Visit: Payer: Self-pay | Admitting: Adult Health

## 2017-07-03 ENCOUNTER — Emergency Department (HOSPITAL_COMMUNITY)
Admission: EM | Admit: 2017-07-03 | Discharge: 2017-07-03 | Discharge status: Against Medical Advice | Payer: Medicaid Other | Attending: Emergency Medicine | Admitting: Emergency Medicine

## 2017-07-03 ENCOUNTER — Emergency Department (HOSPITAL_COMMUNITY): Payer: Medicaid Other

## 2017-07-03 ENCOUNTER — Encounter (HOSPITAL_COMMUNITY): Payer: Self-pay | Admitting: Nurse Practitioner

## 2017-07-03 DIAGNOSIS — F0391 Unspecified dementia with behavioral disturbance: Secondary | ICD-10-CM | POA: Diagnosis not present

## 2017-07-03 DIAGNOSIS — Z9889 Other specified postprocedural states: Secondary | ICD-10-CM

## 2017-07-03 DIAGNOSIS — I10 Essential (primary) hypertension: Secondary | ICD-10-CM | POA: Insufficient documentation

## 2017-07-03 DIAGNOSIS — F17210 Nicotine dependence, cigarettes, uncomplicated: Secondary | ICD-10-CM | POA: Diagnosis not present

## 2017-07-03 DIAGNOSIS — Z79899 Other long term (current) drug therapy: Secondary | ICD-10-CM | POA: Diagnosis not present

## 2017-07-03 DIAGNOSIS — R569 Unspecified convulsions: Secondary | ICD-10-CM | POA: Diagnosis present

## 2017-07-03 DIAGNOSIS — Z8679 Personal history of other diseases of the circulatory system: Secondary | ICD-10-CM | POA: Insufficient documentation

## 2017-07-03 LAB — CBC WITH DIFFERENTIAL/PLATELET
BASOS ABS: 0 10*3/uL (ref 0.0–0.1)
BASOS PCT: 0 %
Eosinophils Absolute: 0.1 10*3/uL (ref 0.0–0.7)
Eosinophils Relative: 1 %
HEMATOCRIT: 43.1 % (ref 39.0–52.0)
HEMOGLOBIN: 14.9 g/dL (ref 13.0–17.0)
LYMPHS PCT: 49 %
Lymphs Abs: 4.2 10*3/uL — ABNORMAL HIGH (ref 0.7–4.0)
MCH: 33.8 pg (ref 26.0–34.0)
MCHC: 34.6 g/dL (ref 30.0–36.0)
MCV: 97.7 fL (ref 78.0–100.0)
Monocytes Absolute: 0.7 10*3/uL (ref 0.1–1.0)
Monocytes Relative: 8 %
NEUTROS ABS: 3.6 10*3/uL (ref 1.7–7.7)
NEUTROS PCT: 42 %
Platelets: 194 10*3/uL (ref 150–400)
RBC: 4.41 MIL/uL (ref 4.22–5.81)
RDW: 12.2 % (ref 11.5–15.5)
WBC: 8.6 10*3/uL (ref 4.0–10.5)

## 2017-07-03 LAB — I-STAT CHEM 8, ED
BUN: 12 mg/dL (ref 6–20)
CHLORIDE: 105 mmol/L (ref 101–111)
CREATININE: 0.6 mg/dL — AB (ref 0.61–1.24)
Calcium, Ion: 1.08 mmol/L — ABNORMAL LOW (ref 1.15–1.40)
GLUCOSE: 105 mg/dL — AB (ref 65–99)
HEMATOCRIT: 44 % (ref 39.0–52.0)
HEMOGLOBIN: 15 g/dL (ref 13.0–17.0)
POTASSIUM: 3.2 mmol/L — AB (ref 3.5–5.1)
Sodium: 142 mmol/L (ref 135–145)
TCO2: 24 mmol/L (ref 22–32)

## 2017-07-03 LAB — BASIC METABOLIC PANEL
ANION GAP: 11 (ref 5–15)
BUN: 14 mg/dL (ref 6–20)
CHLORIDE: 106 mmol/L (ref 101–111)
CO2: 23 mmol/L (ref 22–32)
Calcium: 8.9 mg/dL (ref 8.9–10.3)
Creatinine, Ser: 0.74 mg/dL (ref 0.61–1.24)
GFR calc Af Amer: >60 mL/min (ref >60)
GLUCOSE: 106 mg/dL — AB (ref 65–99)
POTASSIUM: 3.3 mmol/L — AB (ref 3.5–5.1)
Sodium: 140 mmol/L (ref 135–145)

## 2017-07-03 LAB — CARBAMAZEPINE LEVEL, TOTAL: CARBAMAZEPINE LVL: 6.5 ug/mL (ref 4.0–12.0)

## 2017-07-03 MED ORDER — LEVETIRACETAM IN NACL 1000 MG/100ML IV SOLN
1000.0000 mg | Freq: Once | INTRAVENOUS | Status: AC
  Administered 2017-07-03: 1000 mg via INTRAVENOUS
  Filled 2017-07-03: qty 100

## 2017-07-03 NOTE — ED Triage Notes (Signed)
Pt is presented by EMS, reported seizure and also has not been able to regularly taken his seizure medicine.

## 2017-07-03 NOTE — ED Provider Notes (Signed)
[name] [name] Provider Note   CSN: [id number] Arrival date & time: 07/03/17  0044     History   Chief Complaint Chief Complaint  Patient presents with  . Seizures    HPI [name] [name] is a 57 y.o. male.  The history is provided by the patient.  Seizures   This is a recurrent problem. The current episode started less than 1 hour ago. The problem has been resolved. There was 1 seizure. Pertinent negatives include no sleepiness, no headaches, no speech difficulty, no visual disturbance and no chest pain. The episode was not witnessed. There was no sensation of an aura present. The seizures did not continue in the ED. The seizure(s) had no focality. Possible causes do not include medication or dosage change. There has been no fever. There were no medications administered prior to arrival.  Patient reports having a seizure while walking and called 911.    Past Medical History:  Diagnosis Date  . Aneurysm ([facility])   . Atrial fibrillation with RVR ([facility]) 03/03/2017  . ETOHism ([facility])   . Gait abnormality 05/26/2016  . Hepatitis C   . 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 Hypertension   . Seizure [facility])     Patient Active Problem List   Diagnosis Date Noted  . Insomnia 05/07/2017  . Atrial flutter ([facility]) 03/03/2017  . Late effect of cerebrovascular accident (CVA) 01/21/2017  . Bipolar disorder ([facility]) 01/21/2017  . Migraine headache 01/21/2017  . Atrial flutter with rapid ventricular response ([facility]) 01/10/2017  . Syncope 01/10/2017  . Gait abnormality 05/26/2016  . Smoking 01/05/2015  . Chronic pain syndrome 01/05/2015  . Fall   . Paranoia ([facility])   . Dementia with behavioral disturbance   . Encephalopathy, hepatic ([facility]) 10/09/2014  . 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 Seizures ([facility]) 01/24/2014  . 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/23/2014  . Essential hypertension, benign 10/31/2012  . History of  alcohol abuse 10/26/2012  . Chronic liver disease 10/26/2012    Past Surgical History:  Procedure Laterality Date  . A-FLUTTER ABLATION N/A 03/03/2017   Procedure: A-FLUTTER ABLATION;  Surgeon: Duke Salvia, MD;  Location: [facility] INVASIVE CV LAB;  Service: Cardiovascular;  Laterality: N/A;  . ATRIAL FLUTTER ABLATION  03/03/2017  . CEREBRAL ANEURYSM REPAIR     At [facility]  . HARDWARE REMOVAL Left 10/29/2012   Procedure: HARDWARE REMOVAL;  Surgeon: Sheral Apley, MD;  Location: [facility] OR;  Service: Orthopedics;  Laterality: Left;  . I&D EXTREMITY Left 10/26/2012   Procedure: IRRIGATION AND DEBRIDEMENT Left Elbow;  Surgeon: Sheral Apley, MD;  Location: [facility] OR;  Service: Orthopedics;  Laterality: Left;  . I&D EXTREMITY Left 10/29/2012   Procedure: IRRIGATION AND DEBRIDEMENT EXTREMITY, wound vac change, stimulan beads;  Surgeon: Sheral Apley, MD;  Location: [facility] OR;  Service: Orthopedics;  Laterality: Left;  lateral on bean bag  . I&D EXTREMITY Left 11/02/2012   Procedure: IRRIGATION AND DEBRIDEMENT EXTREMITY;  Surgeon: Sheral Apley, MD;  Location: [facility] OR;  Service: Orthopedics;  Laterality: Left;  . INCISION AND DRAINAGE ABSCESS Left 11/02/2012   Procedure: INCISION AND DRAINAGE ABSCESS;  Surgeon: Sheral Apley, MD;  Location: [facility] OR;  Service: Orthopedics;  Laterality: Left;        Home Medications    Prior to Admission medications   Medication Sig Start Date End Date Taking? Authorizing Provider  acetaminophen (TYLENOL) 325 MG tablet Take 650 mg by mouth 3 (three) times  daily.   Yes [provider]  carbamazepine (TEGRETOL) 200 MG tablet 800mg  PO QD X 1D, then 600mg  PO QD X 1D, then 400mg  QD X 1D, then 200mg  PO QD X 2D Patient taking differently: Take 400 mg by mouth 2 (two) times daily.  06/26/17  Yes Liberty Handy, PA-C  Lacosamide 150 MG TABS Give 1 tablet by mouth two times daily Patient taking differently: Take 150 mg by mouth 2 (two) times daily.  06/26/17  Yes  Sharen Heck J, PA-C  latanoprost (XALATAN) 0.005 % ophthalmic solution Place 1 drop into both eyes at bedtime.   Yes [provider]  sertraline (ZOLOFT) 50 MG tablet Take 1 tablet (50 mg total) by mouth daily. Patient taking differently: Take 100 mg by mouth daily.  06/26/17  Yes Liberty Handy, PA-C  zolpidem (AMBIEN) 10 MG tablet Take 1 tablet (10 mg total) by mouth at bedtime. 05/26/17  Yes Sharee Holster, NP  levETIRAcetam (KEPPRA) 750 MG tablet Take 2 tablets (1,500 mg total) by mouth 2 (two) times daily. Patient not taking: Reported on 07/03/2017 06/26/17   Liberty Handy, PA-C  QUEtiapine (SEROQUEL) 200 MG tablet Take 1 tablet (200 mg total) by mouth daily. Patient not taking: Reported on 07/03/2017 06/26/17   Liberty Handy, PA-C  SUMAtriptan (IMITREX) 50 MG tablet Take 1 tablet (50 mg total) by mouth See admin instructions. Give 1 tablet (50 mg) by mouth every 24 hours as needed for migraine headache;  May repeat in 2 hours if headache persists or recurs. Do not exceed 100 mg in 24 hours Patient not taking: Reported on 07/03/2017 06/26/17   Liberty Handy, PA-C  traMADol (ULTRAM) 50 MG tablet Take 1 tablet (50 mg total) by mouth every 6 (six) hours as needed. Patient not taking: Reported on 07/03/2017 05/26/17   Sharee Holster, NP    Family History Family History  Problem Relation Age of Onset  . Hypertension Mother   . Cancer Mother   . Hypertension Father   . Diabetes Sister   . Cancer Sister   . Cancer Sister     Social History Social History   Tobacco Use  . Smoking status: Current Every Day Smoker    Packs/day: 0.25    Types: Cigarettes  . Smokeless tobacco: Never Used  Substance Use Topics  . Alcohol use: No    Alcohol/week: 0.0 oz    Comment: quit drinking 11/2011 per patient  . Drug use: No     Allergies   Carbamazepine and Depakote [divalproex sodium]   Review of Systems Review of Systems  Eyes: Negative for photophobia and visual  disturbance.  Respiratory: Negative for shortness of breath.   Cardiovascular: Negative for chest pain, palpitations and leg swelling.  Gastrointestinal: Negative for abdominal pain.  Neurological: Positive for seizures. Negative for dizziness, tremors, facial asymmetry, speech difficulty, weakness, light-headedness, numbness and headaches.  All other systems reviewed and are negative.    Physical Exam Updated Vital Signs BP (!) 141/94 (BP Location: Right Arm)   Pulse 89   Temp 98 F (36.7 C) (Oral)   Resp 14   SpO2 92%   Physical Exam  Constitutional: He is oriented to person, place, and time. He appears well-developed and well-nourished. No distress.  HENT:  Head: Normocephalic and atraumatic.  Mouth/Throat: No oropharyngeal exudate.  Eyes: Pupils are equal, round, and reactive to light. Conjunctivae and EOM are normal.  Neck: Normal range of motion. Neck supple.  Cardiovascular:  Normal rate, regular rhythm, normal heart sounds and intact distal pulses.  Pulmonary/Chest: Effort normal and breath sounds normal. No stridor. He has no wheezes. He has no rales.  Abdominal: Soft. Bowel sounds are normal. He exhibits no mass. There is no tenderness. There is no rebound and no guarding.  Musculoskeletal: Normal range of motion.  Neurological: He is alert and oriented to person, place, and time. He displays normal reflexes. No cranial nerve deficit.  Skin: Skin is warm and dry. Capillary refill takes less than 2 seconds.  Psychiatric: He has a normal mood and affect.     ED Treatments / Results  Labs (all labs ordered are listed, but only abnormal results are displayed) Results for orders placed or performed during the hospital encounter of 07/03/17  CBC with Differential/Platelet  Result Value Ref Range   WBC 8.6 4.0 - 10.5 K/uL   RBC 4.41 4.22 - 5.81 MIL/uL   Hemoglobin 14.9 13.0 - 17.0 g/dL   HCT 16.1 09.6 - 04.5 %   MCV 97.7 78.0 - 100.0 fL   MCH 33.8 26.0 - 34.0 pg    MCHC 34.6 30.0 - 36.0 g/dL   RDW 40.9 81.1 - 91.4 %   Platelets 194 150 - 400 K/uL   Neutrophils Relative % 42 %   Neutro Abs 3.6 1.7 - 7.7 K/uL   Lymphocytes Relative 49 %   Lymphs Abs 4.2 (H) 0.7 - 4.0 K/uL   Monocytes Relative 8 %   Monocytes Absolute 0.7 0.1 - 1.0 K/uL   Eosinophils Relative 1 %   Eosinophils Absolute 0.1 0.0 - 0.7 K/uL   Basophils Relative 0 %   Basophils Absolute 0.0 0.0 - 0.1 K/uL  Basic metabolic panel  Result Value Ref Range   Sodium 140 135 - 145 mmol/L   Potassium 3.3 (L) 3.5 - 5.1 mmol/L   Chloride 106 101 - 111 mmol/L   CO2 23 22 - 32 mmol/L   Glucose, Bld 106 (H) 65 - 99 mg/dL   BUN 14 6 - 20 mg/dL   Creatinine, Ser 7.82 0.61 - 1.24 mg/dL   Calcium 8.9 8.9 - 95.6 mg/dL   GFR calc non Af Amer >60 >60 mL/min   GFR calc Af Amer >60 >60 mL/min   Anion gap 11 5 - 15  I-stat chem 8, ed  Result Value Ref Range   Sodium 142 135 - 145 mmol/L   Potassium 3.2 (L) 3.5 - 5.1 mmol/L   Chloride 105 101 - 111 mmol/L   BUN 12 6 - 20 mg/dL   Creatinine, Ser 2.13 (L) 0.61 - 1.24 mg/dL   Glucose, Bld 086 (H) 65 - 99 mg/dL   Calcium, Ion 5.78 (L) 1.15 - 1.40 mmol/L   TCO2 24 22 - 32 mmol/L   Hemoglobin 15.0 13.0 - 17.0 g/dL   HCT 46.9 62.9 - 52.8 %   Ct Head Wo Contrast  Result Date: 07/03/2017 CLINICAL DATA:  Acute onset of seizure. EXAM: CT HEAD WITHOUT CONTRAST TECHNIQUE: Contiguous axial images were obtained from the base of the skull through the vertex without intravenous contrast. COMPARISON:  CT of the head performed 01/10/2017 FINDINGS: Brain: No evidence of acute infarction, hemorrhage, hydrocephalus, extra-axial collection or mass lesion / mass effect. Prominence of the ventricles and sulci reflects mild to moderate cortical volume loss. Chronic encephalomalacia is noted at the frontal lobes bilaterally, reflecting remote infarct. The brainstem and fourth ventricle are within normal limits. The basal ganglia are unremarkable in appearance. The  cerebral  hemispheres demonstrate grossly normal gray-white differentiation. No mass effect or midline shift is seen. Vascular: No hyperdense vessel or unexpected calcification. Skull: There is no evidence of fracture; visualized osseous structures are unremarkable in appearance. Sinuses/Orbits: The orbits are within normal limits. The paranasal sinuses and mastoid air cells are well-aerated. Other: No significant soft tissue abnormalities are seen. IMPRESSION: 1. No acute intracranial pathology seen on CT. 2. Mild to moderate cortical volume loss. 3. Chronic encephalomalacia at the frontal lobes bilaterally, reflecting remote infarct. Electronically Signed   By: Roanna RaiderJeffery  Chang M.D.   On: 07/03/2017 01:52    EKG EKG Interpretation  Date/Time:  Monday Myria Steenbergen 15 2019 00:59:36 EDT Ventricular Rate:  84 PR Interval:    QRS Duration: 93 QT Interval:  373 QTC Calculation: 441 R Axis:   -31 Text Interpretation:  Sinus rhythm Probable left atrial enlargement Left ventricular hypertrophy Confirmed by Charlene Cowdrey ([id number]) on 07/03/2017 3:03:06 AM   Radiology Ct Head Wo Contrast  Result Date: 07/03/2017 CLINICAL DATA:  Acute onset of seizure. EXAM: CT HEAD WITHOUT CONTRAST TECHNIQUE: Contiguous axial images were obtained from the base of the skull through the vertex without intravenous contrast. COMPARISON:  CT of the head performed 01/10/2017 FINDINGS: Brain: No evidence of acute infarction, hemorrhage, hydrocephalus, extra-axial collection or mass lesion / mass effect. Prominence of the ventricles and sulci reflects mild to moderate cortical volume loss. Chronic encephalomalacia is noted at the frontal lobes bilaterally, reflecting remote infarct. The brainstem and fourth ventricle are within normal limits. The basal ganglia are unremarkable in appearance. The cerebral hemispheres demonstrate grossly normal gray-white differentiation. No mass effect or midline shift is seen. Vascular: No hyperdense vessel or  unexpected calcification. Skull: There is no evidence of fracture; visualized osseous structures are unremarkable in appearance. Sinuses/Orbits: The orbits are within normal limits. The paranasal sinuses and mastoid air cells are well-aerated. Other: No significant soft tissue abnormalities are seen. IMPRESSION: 1. No acute intracranial pathology seen on CT. 2. Mild to moderate cortical volume loss. 3. Chronic encephalomalacia at the frontal lobes bilaterally, reflecting remote infarct. Electronically Signed   By: Roanna RaiderJeffery  Chang M.D.   On: 07/03/2017 01:52    Procedures Procedures (including critical care time)  Medications Ordered in ED Medications  levETIRAcetam (KEPPRA) IVPB 1000 mg/100 mL premix (1,000 mg Intravenous New Bag/Given 07/03/17 0225)     Was awaiting lab results including tegretol.  Informed patient pulled IV and left.    Final Clinical Impressions(s) / ED Diagnoses   Seizure, eloped.     Dennice Tindol, MD 07/03/17 [phone]

## 2017-10-17 ENCOUNTER — Emergency Department (HOSPITAL_COMMUNITY)
Admission: EM | Admit: 2017-10-17 | Discharge: 2017-10-17 | Disposition: A | Discharge status: Home / Self Care | Payer: Medicaid Other | Attending: Emergency Medicine | Admitting: Emergency Medicine

## 2017-10-17 ENCOUNTER — Encounter (HOSPITAL_COMMUNITY): Payer: Self-pay | Admitting: Emergency Medicine

## 2017-10-17 DIAGNOSIS — F0390 Unspecified dementia without behavioral disturbance: Secondary | ICD-10-CM | POA: Insufficient documentation

## 2017-10-17 DIAGNOSIS — I10 Essential (primary) hypertension: Secondary | ICD-10-CM | POA: Diagnosis not present

## 2017-10-17 DIAGNOSIS — F17210 Nicotine dependence, cigarettes, uncomplicated: Secondary | ICD-10-CM | POA: Diagnosis not present

## 2017-10-17 DIAGNOSIS — Z79899 Other long term (current) drug therapy: Secondary | ICD-10-CM | POA: Diagnosis not present

## 2017-10-17 LAB — COMPREHENSIVE METABOLIC PANEL
ALT: 41 U/L (ref 0–44)
ANION GAP: 12 (ref 5–15)
AST: 45 U/L — ABNORMAL HIGH (ref 15–41)
Albumin: 4.4 g/dL (ref 3.5–5.0)
Alkaline Phosphatase: 59 U/L (ref 38–126)
BUN: 31 mg/dL — ABNORMAL HIGH (ref 6–20)
CO2: 24 mmol/L (ref 22–32)
Calcium: 9.9 mg/dL (ref 8.9–10.3)
Chloride: 103 mmol/L (ref 98–111)
Creatinine, Ser: 0.86 mg/dL (ref 0.61–1.24)
GFR calc Af Amer: >60 mL/min (ref >60)
GFR calc non Af Amer: >60 mL/min (ref >60)
Glucose, Bld: 97 mg/dL (ref 70–99)
Potassium: 3.9 mmol/L (ref 3.5–5.1)
SODIUM: 139 mmol/L (ref 135–145)
Total Bilirubin: 1.4 mg/dL — ABNORMAL HIGH (ref 0.3–1.2)
Total Protein: 8.8 g/dL — ABNORMAL HIGH (ref 6.5–8.1)

## 2017-10-17 LAB — ETHANOL: Alcohol, Ethyl (B): <10 mg/dL (ref <10)

## 2017-10-17 LAB — AMMONIA: AMMONIA: 41 umol/L — AB (ref 9–35)

## 2017-10-17 LAB — CBC
HCT: 48.4 % (ref 39.0–52.0)
HEMOGLOBIN: 17.2 g/dL — AB (ref 13.0–17.0)
MCH: 34.7 pg — ABNORMAL HIGH (ref 26.0–34.0)
MCHC: 35.5 g/dL (ref 30.0–36.0)
MCV: 97.6 fL (ref 78.0–100.0)
Platelets: 166 10*3/uL (ref 150–400)
RBC: 4.96 MIL/uL (ref 4.22–5.81)
RDW: 11.8 % (ref 11.5–15.5)
WBC: 7.7 10*3/uL (ref 4.0–10.5)

## 2017-10-17 LAB — CARBAMAZEPINE LEVEL, TOTAL: Carbamazepine Lvl: <2 ug/mL — ABNORMAL LOW (ref 4.0–12.0)

## 2017-10-17 MED ORDER — QUETIAPINE FUMARATE 100 MG PO TABS
200.0000 mg | ORAL_TABLET | Freq: Every day | ORAL | Status: DC
  Administered 2017-10-17: 200 mg via ORAL
  Filled 2017-10-17: qty 2

## 2017-10-17 MED ORDER — CARBAMAZEPINE 200 MG PO TABS: 400.0000 mg | ORAL_TABLET | Freq: Two times a day (BID) | ORAL | Status: DC

## 2017-10-17 MED ORDER — LEVETIRACETAM 500 MG PO TABS: 1500.0000 mg | ORAL_TABLET | Freq: Two times a day (BID) | ORAL | Status: DC

## 2017-10-17 MED ORDER — SERTRALINE HCL 50 MG PO TABS
50.0000 mg | ORAL_TABLET | Freq: Every day | ORAL | Status: DC
  Administered 2017-10-17: 50 mg via ORAL
  Filled 2017-10-17: qty 1

## 2017-10-17 NOTE — ED Triage Notes (Signed)
Patient BIB [facility], reports they were initially called due to patient trespassing at motel where patient funds ran out. [facility] expects patient has not been compliant with medications as there were excessive medications in the hotel room. Patient reports hx TBI. States "I am confused on why I am even here." Patient denies SI/HI/A/V/H. Patient has no complaints.

## 2017-10-17 NOTE — ED Notes (Signed)
Bed: [facility]3 Expected date:  Expected time:  Means of arrival:  Comments: 

## 2017-10-17 NOTE — Discharge Instructions (Addendum)
Please follow-up with your primary care doctor, make sure to take your medications regularly

## 2017-10-17 NOTE — ED Notes (Signed)
Pt given bus pass ?

## 2017-10-17 NOTE — Progress Notes (Addendum)
Consult request has been received. CSW attempting to follow up at present time.  8:07 PM CSW met with pt who initially stated he did not know where he was or why he was there and could not remember where he had been living.  Pt continued on in this fashion for awhile and then pt finally stated, "Look, let me honest with you, I need a place to go where I belong". CSW stated all he could offer was shelter options and provided said shelter list to the pt.  Pt then again bgan presenting as confused before finally stating, "I can go to my sister's house, can you take me? She lives near [place]" [facility]).  CSW stated he could provide the pt with a bus pass and gave the pt instructosn on how to go to the bus station and ask to be transferred to [employer] and stated pt can just walk there in 4 minutes from the bus station.  Pt voiced understanding.  CSW at the request of the CSW contacted Thomas-Walker,Rhonda at ph: [phone] who stated APS wanted her to get guardianship in order to appropriately since pt was his own legal guardian.  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  Pt's sister, per the pt, is an alcoholic.  Pt per Miss Gilford Rile has had "neuro" issues.  CSW called Clydene Laming on pt's contact list at ph: [phone] list and received the number for the psychotherapeutic services ACTT team and left a VM asking for a return call.  8:42 PM CSW will provide the pt with the phone number for Thomas-Walker,Rhonda at ph: [phone] and ask the pt to gove her a call for help with housing and/or placement.  CSW will provide this to the pt who states he will go to his sister's house.  CSW provided pt with written instructions on how to get to [employer] on [address] by bus (pt states he can get to his sister's house from there, but does not know the address) and had the pt read the instructions back to him twice to insure  pt understands the instructions. Pt again was reminded to call Thomas-Walker,Rhonda at ph: [phone] for assistance with placement/housing.  CSW provided pt with a long-sleeve collared shirt and a large leather jacket, should pt have to remain outside for any reason.  Pt was appreciative and thanked the CSW.  RN updated he will need security to "walk the pt to the bus stop" and insure pt gets on the bus.  RN aware.  Please reconsult if future social work needs arise.  CSW signing off, as social work intervention is no longer needed.  Alphonse Guild. Codie Hainer, LCSW, LCAS, CSI Clinical Social Worker Ph: [phone]

## 2017-10-17 NOTE — ED Provider Notes (Addendum)
[facility]-EMERGENCY DEPT Provider Note   CSN: [id number] Arrival date & time: 10/17/17  1550     History   Chief Complaint Chief Complaint  Patient presents with  . Medical Clearance    HPI [name] [name] is a 57 y.o. male.  HPI Patient was brought to the emergency room for medical evaluation.  According to the EMS report the patient was living at a motel.  Apparently he was cited for trespassing because his funds ran out.  When [employer] arrived they were concerned that the patient needed a medical evaluation.  They noted that there were numerous pill bottles in the hotel room and it was unclear if the patient was taking any of them.  Patient has a history of traumatic brain injury, alcoholism and dementia.  Patient is not sure why he is here.  He thinks there is someone who is supposed to be here with him.  He denies any acute medical complaints. Past Medical History:  Diagnosis Date  . Aneurysm (HCC)   . Atrial fibrillation with RVR (HCC) 03/03/2017  . ETOHism (HCC)   . Gait abnormality 05/26/2016  . Hepatitis C   . 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 Hypertension   . Seizure [facility])     Patient Active Problem List   Diagnosis Date Noted  . Insomnia 05/07/2017  . Atrial flutter (HCC) 03/03/2017  . Late effect of cerebrovascular accident (CVA) 01/21/2017  . Bipolar disorder (HCC) 01/21/2017  . Migraine headache 01/21/2017  . Atrial flutter with rapid ventricular response (HCC) 01/10/2017  . Syncope 01/10/2017  . Gait abnormality 05/26/2016  . Smoking 01/05/2015  . Chronic pain syndrome 01/05/2015  . Fall   . Paranoia (HCC)   . Dementia with behavioral disturbance   . Encephalopathy, hepatic (HCC) 10/09/2014  . 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 Seizures (HCC) 01/24/2014  . 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/23/2014  . Essential hypertension, benign 10/31/2012  . History of alcohol abuse 10/26/2012    . Chronic liver disease 10/26/2012    Past Surgical History:  Procedure Laterality Date  . A-FLUTTER ABLATION N/A 03/03/2017   Procedure: A-FLUTTER ABLATION;  Surgeon: Duke Salvia, MD;  Location: [facility] INVASIVE CV LAB;  Service: Cardiovascular;  Laterality: N/A;  . ATRIAL FLUTTER ABLATION  03/03/2017  . CEREBRAL ANEURYSM REPAIR     At [facility]  . HARDWARE REMOVAL Left 10/29/2012   Procedure: HARDWARE REMOVAL;  Surgeon: Sheral Apley, MD;  Location: [facility] OR;  Service: Orthopedics;  Laterality: Left;  . I&D EXTREMITY Left 10/26/2012   Procedure: IRRIGATION AND DEBRIDEMENT Left Elbow;  Surgeon: Sheral Apley, MD;  Location: [facility] OR;  Service: Orthopedics;  Laterality: Left;  . I&D EXTREMITY Left 10/29/2012   Procedure: IRRIGATION AND DEBRIDEMENT EXTREMITY, wound vac change, stimulan beads;  Surgeon: Sheral Apley, MD;  Location: [facility] OR;  Service: Orthopedics;  Laterality: Left;  lateral on bean bag  . I&D EXTREMITY Left 11/02/2012   Procedure: IRRIGATION AND DEBRIDEMENT EXTREMITY;  Surgeon: Sheral Apley, MD;  Location: [facility] OR;  Service: Orthopedics;  Laterality: Left;  . INCISION AND DRAINAGE ABSCESS Left 11/02/2012   Procedure: INCISION AND DRAINAGE ABSCESS;  Surgeon: Sheral Apley, MD;  Location: [facility] OR;  Service: Orthopedics;  Laterality: Left;        Home Medications    Prior to Admission medications   Medication Sig Start Date End Date Taking? Authorizing Provider  acetaminophen (TYLENOL) 325 MG tablet Take 650  mg by mouth 3 (three) times daily.    [provider]  carbamazepine (TEGRETOL) 200 MG tablet 800mg  PO QD X 1D, then 600mg  PO QD X 1D, then 400mg  QD X 1D, then 200mg  PO QD X 2D Patient taking differently: Take 400 mg by mouth 2 (two) times daily.  06/26/17   Liberty HandyGibbons, Claudia J, PA-C  Lacosamide 150 MG TABS Give 1 tablet by mouth two times daily Patient taking differently: Take 150 mg by mouth 2 (two) times daily.  06/26/17   Liberty HandyGibbons, Claudia J, PA-C   latanoprost (XALATAN) 0.005 % ophthalmic solution Place 1 drop into both eyes at bedtime.    [provider]  levETIRAcetam (KEPPRA) 750 MG tablet Take 2 tablets (1,500 mg total) by mouth 2 (two) times daily. Patient not taking: Reported on 07/03/2017 06/26/17   Liberty HandyGibbons, Claudia J, PA-C  QUEtiapine (SEROQUEL) 200 MG tablet Take 1 tablet (200 mg total) by mouth daily. Patient not taking: Reported on 07/03/2017 06/26/17   Liberty HandyGibbons, Claudia J, PA-C  sertraline (ZOLOFT) 50 MG tablet Take 1 tablet (50 mg total) by mouth daily. Patient taking differently: Take 100 mg by mouth daily.  06/26/17   Liberty HandyGibbons, Claudia J, PA-C  SUMAtriptan (IMITREX) 50 MG tablet Take 1 tablet (50 mg total) by mouth See admin instructions. Give 1 tablet (50 mg) by mouth every 24 hours as needed for migraine headache;  May repeat in 2 hours if headache persists or recurs. Do not exceed 100 mg in 24 hours Patient not taking: Reported on 07/03/2017 06/26/17   Liberty HandyGibbons, Claudia J, PA-C  traMADol (ULTRAM) 50 MG tablet Take 1 tablet (50 mg total) by mouth every 6 (six) hours as needed. Patient not taking: Reported on 07/03/2017 05/26/17   Sharee HolsterGreen, Deborah S, NP  zolpidem (AMBIEN) 10 MG tablet Take 1 tablet (10 mg total) by mouth at bedtime. 05/26/17   Sharee HolsterGreen, Deborah S, NP    Family History Family History  Problem Relation Age of Onset  . Hypertension Mother   . Cancer Mother   . Hypertension Father   . Diabetes Sister   . Cancer Sister   . Cancer Sister     Social History Social History   Tobacco Use  . Smoking status: Current Every Day Smoker    Packs/day: 0.25    Types: Cigarettes  . Smokeless tobacco: Never Used  Substance Use Topics  . Alcohol use: No    Alcohol/week: 0.0 oz    Comment: quit drinking 11/2011 per patient  . Drug use: No     Allergies   Carbamazepine and Depakote [divalproex sodium]   Review of Systems Review of Systems  All other systems reviewed and are negative.    Physical Exam Updated  Vital Signs BP (!) 154/111 (BP Location: Right Arm)   Pulse 98   Temp 97.9 F (36.6 C) (Oral)   Resp 18   SpO2 95%   Physical Exam  Constitutional: No distress.  HENT:  Head: Normocephalic and atraumatic.  Right Ear: External ear normal.  Left Ear: External ear normal.  Eyes: Conjunctivae are normal. Right eye exhibits no discharge. Left eye exhibits no discharge. No scleral icterus.  Neck: Neck supple. No tracheal deviation present.  Cardiovascular: Normal rate, regular rhythm and intact distal pulses.  Pulmonary/Chest: Effort normal and breath sounds normal. No stridor. No respiratory distress. He has no wheezes. He has no rales.  Abdominal: Soft. Bowel sounds are normal. He exhibits no distension. There is no tenderness. There is no  rebound and no guarding.  Musculoskeletal: He exhibits no edema or tenderness.  Neurological: He is alert. He has normal strength. No cranial nerve deficit (no facial droop, extraocular movements intact, no slurred speech) or sensory deficit. He exhibits normal muscle tone. He displays no seizure activity. Coordination normal. GCS eye subscore is 4. GCS verbal subscore is 4. GCS motor subscore is 6.  Patient is disoriented about the date including the month and year he also does not recall who the president is  Skin: Skin is warm and dry. No rash noted. He is not diaphoretic.  Psychiatric: He has a normal mood and affect.  Nursing note and vitals reviewed.    ED Treatments / Results  Labs (all labs ordered are listed, but only abnormal results are displayed) Labs Reviewed  CBC - Abnormal; Notable for the following components:      Result Value   Hemoglobin 17.2 (*)    MCH 34.7 (*)    All other components within normal limits  COMPREHENSIVE METABOLIC PANEL - Abnormal; Notable for the following components:   BUN 31 (*)    Total Protein 8.8 (*)    AST 45 (*)    Total Bilirubin 1.4 (*)    All other components within normal limits  AMMONIA -  Abnormal; Notable for the following components:   Ammonia 41 (*)    All other components within normal limits  ETHANOL  RAPID URINE DRUG SCREEN, HOSP PERFORMED  CARBAMAZEPINE LEVEL, TOTAL     Procedures Procedures (including critical care time)  Medications Ordered in ED Medications  carbamazepine (TEGRETOL) tablet 400 mg (has no administration in time range)  levETIRAcetam (KEPPRA) tablet 1,500 mg (has no administration in time range)  QUEtiapine (SEROQUEL) tablet 200 mg (has no administration in time range)  sertraline (ZOLOFT) tablet 50 mg (has no administration in time range)     Initial Impression / Assessment and Plan / ED Course  I have reviewed the triage vital signs and the nursing notes.  Pertinent labs & imaging results that were available during my care of the patient were reviewed by me and considered in my medical decision making (see chart for details).  Clinical Course as of Oct 18 2002  Tue Oct 17, 2017  1701 Discussed with Ms Precious Reel.  Friend of the family.  Has helped care for him in the past.  She is going to work on placement   [JK]  2003 Labs reviewed.  No clinically significant abnormalities.   [JK]  2003 Tegretol level and urine drug screen pending   [JK]    Clinical Course User Index [JK] Linwood Dibbles, MD    Patient presented to the emergency room for evaluation after being kicked out at home motel that he was staying at.  Police felt the patient acquired medical evaluation.  Patient appears to have some chronic medical problems including memory issues.  I was able to contact someone who knows him, Ms. Precious Reel.  She indicates the patient has had problems in the past when he is not taking his medications.  Patient is alert and awake.  He denies any headache.  Have a low suspicion for any acute CNS event.  I have ordered the patient's medications.  Have consulted with social worker to see if we can try to find a safe place for this gentleman to  stay.    Final Clinical Impressions(s) / ED Diagnoses   Final diagnoses:  Dementia without behavioral disturbance, unspecified dementia type  ED Discharge Orders    None       Linwood Dibbles, MD 10/17/17 2004  Appreciate social work assistance.  Pt is going to stay with his sister this evening.   Pt was given specific written instructions on how to get to his sisters house.  Ms Precious Reel was updated on the patients status.   Linwood Dibbles, MD 10/17/17 2112

## 2017-10-18 ENCOUNTER — Emergency Department (HOSPITAL_COMMUNITY)
Admission: EM | Admit: 2017-10-18 | Discharge: 2017-10-18 | Discharge status: Absent without leave | Payer: Medicaid Other | Attending: Emergency Medicine | Admitting: Emergency Medicine

## 2017-10-18 NOTE — ED Provider Notes (Signed)
Patient arrived to the ED but left prior to triage or EDP evaluation.   [name] BeneJoshua Jakeria Caissie, MD   [name], Lewayne Pauley G, MD 10/18/17 [phone]

## 2017-10-24 ENCOUNTER — Emergency Department (HOSPITAL_COMMUNITY): Payer: Medicaid Other

## 2017-10-24 ENCOUNTER — Emergency Department (HOSPITAL_COMMUNITY)
Admission: EM | Admit: 2017-10-24 | Discharge: 2017-10-25 | Disposition: A | Discharge status: Home / Self Care | Payer: Medicaid Other | Attending: Emergency Medicine | Admitting: Emergency Medicine

## 2017-10-24 ENCOUNTER — Other Ambulatory Visit: Payer: Self-pay

## 2017-10-24 ENCOUNTER — Encounter (HOSPITAL_COMMUNITY): Payer: Self-pay | Admitting: *Deleted

## 2017-10-24 DIAGNOSIS — F0391 Unspecified dementia with behavioral disturbance: Secondary | ICD-10-CM | POA: Insufficient documentation

## 2017-10-24 DIAGNOSIS — R0789 Other chest pain: Secondary | ICD-10-CM | POA: Diagnosis not present

## 2017-10-24 DIAGNOSIS — F17210 Nicotine dependence, cigarettes, uncomplicated: Secondary | ICD-10-CM | POA: Diagnosis not present

## 2017-10-24 DIAGNOSIS — R410 Disorientation, unspecified: Secondary | ICD-10-CM

## 2017-10-24 DIAGNOSIS — R079 Chest pain, unspecified: Secondary | ICD-10-CM | POA: Diagnosis present

## 2017-10-24 DIAGNOSIS — I10 Essential (primary) hypertension: Secondary | ICD-10-CM | POA: Insufficient documentation

## 2017-10-24 NOTE — ED Triage Notes (Signed)
Pt transported from a gas station off of [address].  He called [employer] due to confusion and not knowing where he was at.  Paramedic reports pt is repetative with c/o cp with dark urine. Pt is alert.

## 2017-10-24 NOTE — ED Provider Notes (Signed)
[facility] EMERGENCY DEPARTMENT Provider Note   CSN: [id number] Arrival date & time: 10/24/17  2255     History   Chief Complaint Chief Complaint  Patient presents with  . Chest Pain  . Altered Mental Status    HPI [name] [name] is a 57 y.o. male.  HPI  This is a 57 year old male with a history of atrial fibrillation, alcohol abuse, hepatitis, hypertension, seizure, dementia with behavioral disturbance who presents with chest pain.  Per EMS, they picked the patient up from a gas station.  He appeared confused.  He was complaining of chest and arm pain.  On my exam, patient states "there is something there in my arm.  When asked what it is he states "I saw something."  He also states that he saw in his chest.  He denies any actual pain.  He appears confused and may be hallucinating.  He is very difficult to obtain collateral information from.  He denies any recent illnesses or fevers.  Denies any cough or shortness of breath.  He does report alcohol use tonight.  He is oriented to place and self.  I reviewed the patient's chart.  Recent evaluation for mental status changes after being found trespassing.  That time he was evaluated by social work after being medically cleared.  They found him a place to live.  He has a sheet of paper with his room number.  He has a history of traumatic brain injury and dementia.  During last evaluation he was only oriented x2.  Past Medical History:  Diagnosis Date  . Aneurysm (HCC)   . Atrial fibrillation with RVR (HCC) 03/03/2017  . ETOHism (HCC)   . Gait abnormality 05/26/2016  . Hepatitis C   . 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 Hypertension   . Seizure [facility])     Patient Active Problem List   Diagnosis Date Noted  . Insomnia 05/07/2017  . Atrial flutter (HCC) 03/03/2017  . Late effect of cerebrovascular accident (CVA) 01/21/2017  . Bipolar disorder (HCC) 01/21/2017  . Migraine headache  01/21/2017  . Atrial flutter with rapid ventricular response (HCC) 01/10/2017  . Syncope 01/10/2017  . Gait abnormality 05/26/2016  . Smoking 01/05/2015  . Chronic pain syndrome 01/05/2015  . Fall   . Paranoia (HCC)   . Dementia with behavioral disturbance   . Encephalopathy, hepatic (HCC) 10/09/2014  . 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 Seizures (HCC) 01/24/2014  . 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/23/2014  . Essential hypertension, benign 10/31/2012  . History of alcohol abuse 10/26/2012  . Chronic liver disease 10/26/2012    Past Surgical History:  Procedure Laterality Date  . A-FLUTTER ABLATION N/A 03/03/2017   Procedure: A-FLUTTER ABLATION;  Surgeon: Duke Salvia, MD;  Location: [facility] INVASIVE CV LAB;  Service: Cardiovascular;  Laterality: N/A;  . ATRIAL FLUTTER ABLATION  03/03/2017  . CEREBRAL ANEURYSM REPAIR     At [facility]  . HARDWARE REMOVAL Left 10/29/2012   Procedure: HARDWARE REMOVAL;  Surgeon: Sheral Apley, MD;  Location: [facility] OR;  Service: Orthopedics;  Laterality: Left;  . I&D EXTREMITY Left 10/26/2012   Procedure: IRRIGATION AND DEBRIDEMENT Left Elbow;  Surgeon: Sheral Apley, MD;  Location: [facility] OR;  Service: Orthopedics;  Laterality: Left;  . I&D EXTREMITY Left 10/29/2012   Procedure: IRRIGATION AND DEBRIDEMENT EXTREMITY, wound vac change, stimulan beads;  Surgeon: Sheral Apley, MD;  Location: [facility] OR;  Service: Orthopedics;  Laterality: Left;  lateral on bean bag  . I&D EXTREMITY Left 11/02/2012   Procedure: IRRIGATION AND DEBRIDEMENT EXTREMITY;  Surgeon: Sheral Apley, MD;  Location: [facility] OR;  Service: Orthopedics;  Laterality: Left;  . INCISION AND DRAINAGE ABSCESS Left 11/02/2012   Procedure: INCISION AND DRAINAGE ABSCESS;  Surgeon: Sheral Apley, MD;  Location: [facility] OR;  Service: Orthopedics;  Laterality: Left;        Home Medications    Prior to Admission medications   Medication Sig Start Date End Date Taking?  Authorizing Provider  carbamazepine (TEGRETOL) 200 MG tablet 800mg  PO QD X 1D, then 600mg  PO QD X 1D, then 400mg  QD X 1D, then 200mg  PO QD X 2D Patient not taking: Reported on 10/25/2017 06/26/17   Liberty Handy, PA-C  Lacosamide 150 MG TABS Give 1 tablet by mouth two times daily Patient not taking: Reported on 10/25/2017 06/26/17   Liberty Handy, PA-C  levETIRAcetam (KEPPRA) 750 MG tablet Take 2 tablets (1,500 mg total) by mouth 2 (two) times daily. Patient not taking: Reported on 07/03/2017 06/26/17   Liberty Handy, PA-C  QUEtiapine (SEROQUEL) 200 MG tablet Take 1 tablet (200 mg total) by mouth daily. Patient not taking: Reported on 07/03/2017 06/26/17   Liberty Handy, PA-C  sertraline (ZOLOFT) 50 MG tablet Take 1 tablet (50 mg total) by mouth daily. Patient not taking: Reported on 10/25/2017 06/26/17   Liberty Handy, PA-C  SUMAtriptan (IMITREX) 50 MG tablet Take 1 tablet (50 mg total) by mouth See admin instructions. Give 1 tablet (50 mg) by mouth every 24 hours as needed for migraine headache;  May repeat in 2 hours if headache persists or recurs. Do not exceed 100 mg in 24 hours Patient not taking: Reported on 07/03/2017 06/26/17   Liberty Handy, PA-C  traMADol (ULTRAM) 50 MG tablet Take 1 tablet (50 mg total) by mouth every 6 (six) hours as needed. Patient not taking: Reported on 07/03/2017 05/26/17   Sharee Holster, NP  zolpidem (AMBIEN) 10 MG tablet Take 1 tablet (10 mg total) by mouth at bedtime. Patient not taking: Reported on 10/25/2017 05/26/17   Sharee Holster, NP    Family History Family History  Problem Relation Age of Onset  . Hypertension Mother   . Cancer Mother   . Hypertension Father   . Diabetes Sister   . Cancer Sister   . Cancer Sister     Social History Social History   Tobacco Use  . Smoking status: Current Every Day Smoker    Packs/day: 0.25    Types: Cigarettes  . Smokeless tobacco: Never Used  Substance Use Topics  . Alcohol use: No     Alcohol/week: 0.0 oz    Comment: quit drinking 11/2011 per patient  . Drug use: No     Allergies   Carbamazepine and Depakote [divalproex sodium]   Review of Systems Review of Systems  Constitutional: Negative for fever.  Respiratory: Negative for shortness of breath.   Cardiovascular: Positive for chest pain.  Gastrointestinal: Negative for abdominal pain.  Genitourinary: Negative for dysuria.  Musculoskeletal: Negative for back pain.  Skin: Positive for rash.  Psychiatric/Behavioral: Positive for confusion and hallucinations.  All other systems reviewed and are negative.    Physical Exam Updated Vital Signs BP (!) 130/98 (BP Location: Left Arm)   Pulse 88   Temp 97.6 F (36.4 C) (Oral)   Resp 16   SpO2 99%   Physical Exam  Constitutional: He appears well-developed  and well-nourished.  Disheveled appearing  HENT:  Head: Normocephalic and atraumatic.  Poor dentition  Eyes: Pupils are equal, round, and reactive to light.  Cardiovascular: Normal rate, regular rhythm, normal heart sounds and normal pulses.  No murmur heard. Pulmonary/Chest: Effort normal and breath sounds normal. No respiratory distress. He has no wheezes.  Abdominal: Soft. Bowel sounds are normal. There is no tenderness. There is no rebound.  Musculoskeletal: He exhibits no edema.  Neurological: He is alert.  Oriented x2, follows commands, 5 out of 5 strength in all 4 extremities, normal speech pattern  Skin: Skin is warm and dry. Rash noted.  Rash involving mostly the back of the trunk  Psychiatric:  Tangential, difficult to obtain history from  Nursing note and vitals reviewed.    ED Treatments / Results  Labs (all labs ordered are listed, but only abnormal results are displayed) Labs Reviewed  CBC WITH DIFFERENTIAL/PLATELET - Abnormal; Notable for the following components:      Result Value   MCH 34.3 (*)    Platelets 142 (*)    All other components within normal limits  BASIC METABOLIC  PANEL - Abnormal; Notable for the following components:   Potassium 3.0 (*)    Glucose, Bld 135 (*)    BUN 21 (*)    All other components within normal limits  ETHANOL  I-STAT TROPONIN, ED    EKG EKG Interpretation  Date/Time:  Tuesday October 24 2017 23:21:09 EDT Ventricular Rate:  82 PR Interval:    QRS Duration: 89 QT Interval:  409 QTC Calculation: 478 R Axis:   31 Text Interpretation:  Sinus rhythm Borderline prolonged QT interval Confirmed by Ross Marcus ([id number]) on 10/24/2017 11:30:35 PM   Radiology Dg Chest 2 View  Result Date: 10/25/2017 CLINICAL DATA:  Acute onset of altered mental status. Confusion. EXAM: CHEST - 2 VIEW COMPARISON:  Chest radiograph performed 01/10/2017 FINDINGS: The lungs are well-aerated and clear. There is no evidence of focal opacification, pleural effusion or pneumothorax. The heart is normal in size; the mediastinal contour is within normal limits. No acute osseous abnormalities are seen. IMPRESSION: No acute cardiopulmonary process seen. Electronically Signed   By: Roanna Raider M.D.   On: 10/25/2017 00:09    Procedures Procedures (including critical care time)  Medications Ordered in ED Medications - No data to display   Initial Impression / Assessment and Plan / ED Course  I have reviewed the triage vital signs and the nursing notes.  Pertinent labs & imaging results that were available during my care of the patient were reviewed by me and considered in my medical decision making (see chart for details).     Patient presents after being picked up by EMS.  Appears confused.  Appears to be hallucinating and saying that something is in his chest and right arm.  I have assured the patient that there is no organism crawling on him.  His EKG is reassuring and his troponin is negative.  Doubt ACS.  He appears to be has baseline otherwise.  He is oriented to self and place but not time which is consistent with his prior exam.  He does not appear  to be focal in any nature.  Doubt intracranial abnormality or stroke.  Blood alcohol level is negative.  Lab work-up is largely reassuring with exception of a slightly diminished potassium.  Suspect patient likely at his baseline.  Will discharge by PTAR back to his room.    After history, exam, and medical  workup I feel the patient has been appropriately medically screened and is safe for discharge home. Pertinent diagnoses were discussed with the patient. Patient was given return precautions.   Final Clinical Impressions(s) / ED Diagnoses   Final diagnoses:  Confusion  Atypical chest pain    ED Discharge Orders    None       Cherron Blitzer, Mayer Maskerourtney F, MD 10/25/17 [phone]

## 2017-10-25 ENCOUNTER — Emergency Department (EMERGENCY_DEPARTMENT_HOSPITAL)
Admission: EM | Admit: 2017-10-25 | Discharge: 2017-10-26 | Disposition: A | Discharge status: Home / Self Care | Payer: Medicaid Other | Source: Home / Self Care | Attending: Emergency Medicine | Admitting: Emergency Medicine

## 2017-10-25 ENCOUNTER — Emergency Department (HOSPITAL_COMMUNITY): Payer: Medicaid Other

## 2017-10-25 ENCOUNTER — Other Ambulatory Visit: Payer: Self-pay

## 2017-10-25 ENCOUNTER — Encounter (HOSPITAL_COMMUNITY): Payer: Self-pay

## 2017-10-25 DIAGNOSIS — R410 Disorientation, unspecified: Secondary | ICD-10-CM

## 2017-10-25 DIAGNOSIS — E876 Hypokalemia: Secondary | ICD-10-CM

## 2017-10-25 DIAGNOSIS — F1414 Cocaine abuse with cocaine-induced mood disorder: Secondary | ICD-10-CM

## 2017-10-25 DIAGNOSIS — F329 Major depressive disorder, single episode, unspecified: Secondary | ICD-10-CM

## 2017-10-25 DIAGNOSIS — F32A Depression, unspecified: Secondary | ICD-10-CM

## 2017-10-25 LAB — COMPREHENSIVE METABOLIC PANEL
ALBUMIN: 4.4 g/dL (ref 3.5–5.0)
ALK PHOS: 77 U/L (ref 38–126)
ALT: 29 U/L (ref 0–44)
ANION GAP: 16 — AB (ref 5–15)
AST: 35 U/L (ref 15–41)
BILIRUBIN TOTAL: 1.5 mg/dL — AB (ref 0.3–1.2)
BUN: 23 mg/dL — ABNORMAL HIGH (ref 6–20)
CALCIUM: 9.9 mg/dL (ref 8.9–10.3)
CO2: 20 mmol/L — ABNORMAL LOW (ref 22–32)
Chloride: 101 mmol/L (ref 98–111)
Creatinine, Ser: 1.34 mg/dL — ABNORMAL HIGH (ref 0.61–1.24)
GFR calc Af Amer: >60 mL/min (ref >60)
GFR calc non Af Amer: 57 mL/min — ABNORMAL LOW (ref >60)
GLUCOSE: 100 mg/dL — AB (ref 70–99)
POTASSIUM: 3.2 mmol/L — AB (ref 3.5–5.1)
Sodium: 137 mmol/L (ref 135–145)
Total Protein: 8.6 g/dL — ABNORMAL HIGH (ref 6.5–8.1)

## 2017-10-25 LAB — CBC
HEMATOCRIT: 47.5 % (ref 39.0–52.0)
Hemoglobin: 16.5 g/dL (ref 13.0–17.0)
MCH: 33.7 pg (ref 26.0–34.0)
MCHC: 34.7 g/dL (ref 30.0–36.0)
MCV: 97.1 fL (ref 78.0–100.0)
PLATELETS: 178 10*3/uL (ref 150–400)
RBC: 4.89 MIL/uL (ref 4.22–5.81)
RDW: 11.7 % (ref 11.5–15.5)
WBC: 8.6 10*3/uL (ref 4.0–10.5)

## 2017-10-25 LAB — BASIC METABOLIC PANEL
Anion gap: 14 (ref 5–15)
BUN: 21 mg/dL — AB (ref 6–20)
CHLORIDE: 99 mmol/L (ref 98–111)
CO2: 25 mmol/L (ref 22–32)
CREATININE: 0.95 mg/dL (ref 0.61–1.24)
Calcium: 9.7 mg/dL (ref 8.9–10.3)
Glucose, Bld: 135 mg/dL — ABNORMAL HIGH (ref 70–99)
Potassium: 3 mmol/L — ABNORMAL LOW (ref 3.5–5.1)
SODIUM: 138 mmol/L (ref 135–145)

## 2017-10-25 LAB — I-STAT TROPONIN, ED: TROPONIN I, POC: 0.01 ng/mL (ref 0.00–0.08)

## 2017-10-25 LAB — RAPID URINE DRUG SCREEN, HOSP PERFORMED
Amphetamines: NOT DETECTED
BARBITURATES: NOT DETECTED
Benzodiazepines: NOT DETECTED
COCAINE: POSITIVE — AB
Opiates: NOT DETECTED
Tetrahydrocannabinol: NOT DETECTED

## 2017-10-25 LAB — CBG MONITORING, ED: GLUCOSE-CAPILLARY: 114 mg/dL — AB (ref 70–99)

## 2017-10-25 LAB — ETHANOL: Alcohol, Ethyl (B): <10 mg/dL (ref <10)

## 2017-10-25 LAB — CBC WITH DIFFERENTIAL/PLATELET
Abs Immature Granulocytes: 0 10*3/uL (ref 0.0–0.1)
BASOS ABS: 0 10*3/uL (ref 0.0–0.1)
Basophils Relative: 1 %
EOS PCT: 0 %
Eosinophils Absolute: 0 10*3/uL (ref 0.0–0.7)
HEMATOCRIT: 45.5 % (ref 39.0–52.0)
Hemoglobin: 16 g/dL (ref 13.0–17.0)
Immature Granulocytes: 0 %
LYMPHS ABS: 3.6 10*3/uL (ref 0.7–4.0)
Lymphocytes Relative: 52 %
MCH: 34.3 pg — ABNORMAL HIGH (ref 26.0–34.0)
MCHC: 35.2 g/dL (ref 30.0–36.0)
MCV: 97.4 fL (ref 78.0–100.0)
Monocytes Absolute: 0.9 10*3/uL (ref 0.1–1.0)
Monocytes Relative: 12 %
Neutro Abs: 2.5 10*3/uL (ref 1.7–7.7)
Neutrophils Relative %: 35 %
Platelets: 142 10*3/uL — ABNORMAL LOW (ref 150–400)
RBC: 4.67 MIL/uL (ref 4.22–5.81)
RDW: 11.7 % (ref 11.5–15.5)
WBC: 7 10*3/uL (ref 4.0–10.5)

## 2017-10-25 LAB — ACETAMINOPHEN LEVEL

## 2017-10-25 LAB — SALICYLATE LEVEL: Salicylate Lvl: <7 mg/dL (ref 2.8–30.0)

## 2017-10-25 MED ORDER — QUETIAPINE FUMARATE 200 MG PO TABS
200.0000 mg | ORAL_TABLET | Freq: Every day | ORAL | Status: DC
  Administered 2017-10-25 – 2017-10-26 (×2): 200 mg via ORAL
  Filled 2017-10-25 (×2): qty 1

## 2017-10-25 MED ORDER — LEVETIRACETAM 750 MG PO TABS
1500.0000 mg | ORAL_TABLET | Freq: Two times a day (BID) | ORAL | Status: DC
  Administered 2017-10-25 – 2017-10-26 (×2): 1500 mg via ORAL
  Filled 2017-10-25: qty 2
  Filled 2017-10-25: qty 3

## 2017-10-25 MED ORDER — SERTRALINE HCL 50 MG PO TABS
50.0000 mg | ORAL_TABLET | Freq: Every day | ORAL | Status: DC
  Administered 2017-10-25 – 2017-10-26 (×2): 50 mg via ORAL
  Filled 2017-10-25 (×2): qty 1

## 2017-10-25 NOTE — ED Notes (Signed)
Patient given a cab voucher back to [facility] via [place],RN; This RN has [facility]; Security called to escort patient out of POD; patient has become agitated  and pulled his own IV-Monique,RN

## 2017-10-25 NOTE — Progress Notes (Addendum)
CSW aware of consult. CSW will follow up with pt after TTS assessment.   Due to social situation and high number of visits, CSW reached out to [employer] representative, Carlynn SpryBrooks Ann.   Montine CircleKelsy Seba Madole, Silverio LayLCSWA [facility]  [phone]

## 2017-10-25 NOTE — ED Notes (Signed)
[facility], original placed in red folder in PODF; 3 copies remain with Primary RN on chart;copy placed in medical records-Monique,RN

## 2017-10-25 NOTE — ED Notes (Signed)
Dispatch contacted for PTAR to transport pt back [facility]

## 2017-10-25 NOTE — ED Triage Notes (Signed)
Pt IVC'd by his counsellor due to worsening BH behavior, depression x 1 month. [facility] went [facility] and was unable to find him but pt left room in disarray. [facility] Pt has been cooperative and redirectable. Has not been taking bh meds.

## 2017-10-25 NOTE — ED Notes (Signed)
Pt in purple scrubs and personal items have been placed in locker #6  ; sitter at bedside and patient in constant line of sight

## 2017-10-25 NOTE — Progress Notes (Signed)
Pt meets inpatient criteria per Donell SievertSpencer Simon, PA. Referral information has been sent to the following hospitals for review: [facility]  [facility]  [facility]  [facility]  [facility]  [facility]  [facility]  [facility]  [facility]  [facility]   CSW will continue to assist with placement needs.   Wells GuilesSarah Gar Glance, LCSW, LCAS Disposition CSW [facility] [phone]

## 2017-10-25 NOTE — ED Notes (Signed)
[facility]

## 2017-10-25 NOTE — ED Notes (Signed)
Ordered reg tray/no sharps

## 2017-10-25 NOTE — BH Assessment (Signed)
Tele Assessment Note   Patient Name: [name] [name] MRN: [id number] Referring Physician: Terance Hart, PA Location of Patient: [facility] Location of Provider: Behavioral Health TTS Department  [name] [name] is an 57 y.o. male.  -Clinician reviewed note by Terance Hart, PA.  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 Cognition and behavior have changed drastically over the last 30 days. He is not longer able to contract for own safety of take care of hygiene. He was picked up by [employer] while wandering the streets, not knowing where he lived, very confused which causes aggression". [employer] went [facility] to [facility] him and he wasn't there and his room was in disarray. The patient tells me his name and where he is. He cannot tell me why he is here but states he's hungry.  Patient says he knows he is in [place].  He looks away at times as if looking at something not in the room.  Patient does not know where he lives in [place].  At one point he thanked clinician for being in [place] because he thought he was in [place].  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    Patient denies SI, HI or A/V hallucinations.  He admits to using ETOH and cocaine but can only say "I don't use them much."  Pt is positive for cocaine on UDS.  Pt is pleasant to talk with although he is only oriented x2.  He thanks clinician for talking to him.  He talks about having had an aneurysm but can't remember when.  He said he can't concentrate much since then.    Past tx history is hard to get from patient.  Clinician called the counselor, Charlott Rakes [phone] but has not heard back from him yet.  HIPPA compliant message was left.  -Clinician discussed patient care with Donell Sievert, PA who recommends geropsych placement.  Clinician informed  Terance Hart, PA of the disposition.  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 Aneurysm (HCC)   . Atrial fibrillation with RVR (HCC) 03/03/2017  . ETOHism (HCC)   . Gait abnormality 05/26/2016  . Hepatitis C   . 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 Hypertension   . Seizure [facility])     Past Surgical History:  Procedure Laterality Date  . A-FLUTTER ABLATION N/A 03/03/2017   Procedure: A-FLUTTER ABLATION;  Surgeon: Duke Salvia, MD;  Location[facility] INVASIVE CV LAB;  Service: Cardiovascular;  Laterality: N/A;  . ATRIAL FLUTTER ABLATION  03/03/2017  . CEREBRAL ANEURYSM REPAIR     At [facility]  . HARDWARE REMOVAL Left 10/29/2012   Procedure: HARDWARE REMOVAL;  Surgeon: Sheral Apley, MD;  Location: [facility] OR;  Service: Orthopedics;  Laterality: Left;  . I&D EXTREMITY Left 10/26/2012   Procedure: IRRIGATION AND DEBRIDEMENT Left Elbow;  Surgeon: Sheral Apley, MD;  [facility] OR;  Service: Orthopedics;  Laterality: Left;  . I&D EXTREMITY Left 10/29/2012   Procedure: IRRIGATION AND DEBRIDEMENT EXTREMITY, wound vac change, stimulan beads;  Surgeon: Sheral Apley, MD;  Location: [facility] OR;  Service: Orthopedics;  Laterality: Left;  lateral on bean bag  . I&D EXTREMITY Left 11/02/2012   Procedure: IRRIGATION AND DEBRIDEMENT EXTREMITY;  Surgeon: Sheral Apleyimothy D Murphy, MD;  Location: [facility] OR;  Service: Orthopedics;  Laterality: Left;  . INCISION AND DRAINAGE ABSCESS Left 11/02/2012   Procedure: INCISION AND DRAINAGE ABSCESS;  Surgeon: Sheral Apleyimothy D Murphy, MD;  Location: [facility] OR;  Service: Orthopedics;  Laterality: Left;    Family History:  Family History  Problem Relation Age of Onset  . Hypertension Mother   . Cancer Mother   .  Hypertension Father   . Diabetes Sister   . Cancer Sister   . Cancer Sister     Social History:  reports that he has been smoking cigarettes.  He has been smoking about 0.25 packs per day. He has never used smokeless tobacco. He reports that he does not drink alcohol or use drugs.  Additional Social History:  Alcohol / Drug Use Pain Medications: Tegretol Prescriptions: Can't recall medications Over the Counter: Can't recall medications. History of alcohol / drug use?: Yes Substance #1 Name of Substance 1: ETOH 1 - Age of First Use: unknown 1 - Amount (size/oz): Unknown 1 - Frequency: "not often" 1 - Duration: off and on 1 - Last Use / Amount: Can't recall Substance #2 Name of Substance 2: Cocaine 2 - Age of First Use: unknown 2 - Amount (size/oz): Varies 2 - Frequency: Unknown 2 - Duration: off and on 2 - Last Use / Amount: Can't recall  CIWA: CIWA-Ar BP: (!) 135/94 Pulse Rate: 82 COWS:    Allergies:  Allergies  Allergen Reactions  . Carbamazepine Other (See Comments)    Hyponatremia  . Depakote [Divalproex Sodium] Other (See Comments)    Unknown - on MAR    Home Medications:  (Not in a hospital admission)  OB/GYN Status:  No LMP for male patient.  General Assessment Data [facility] Assessment: In system Is this a Tele or Face-to-Face Assessment?: Tele Assessment Is this an Initial Assessment or a Re-assessment for this encounter?: Initial Assessment Marital status: Single Is patient pregnant?: No Pregnancy Status: No Living Arrangements: Other (Comment)([facility] [place] Can pt return to current living arrangement?: Yes Admission Status: Involuntary Is patient capable of signing voluntary admission?: No Referral Source: Other(Jack Bolick [phone])     Crisis Care Plan Living Arrangements: Other (Comment)([facility] [place] Name of Psychiatrist: Unknown Name of Therapist: No  Education Status Is patient  currently in school?: No Is the patient employed, unemployed or receiving disability?: Receiving disability income  Risk to self with the past 6 months Suicidal Ideation: No Has patient been a risk to self within the past 6 months prior to admission? : No Suicidal Intent: No Has patient had any suicidal intent within the past 6 months prior to admission? : No Is patient at risk for suicide?: No Suicidal Plan?: No Has patient had any suicidal plan within the past 6 months prior to admission? : No Access to Means: No What has been your use of drugs/alcohol within the last 12 months?: Cocaine Previous Attempts/Gestures: No How many times?: (Unknown) Other Self Harm Risks: None Triggers for Past Attempts: None known Intentional Self Injurious Behavior: None Family Suicide History: No Recent stressful life event(s): Turmoil (Comment), Financial Problems(On disability; Confusion) Persecutory voices/beliefs?: No Depression: No Depression Symptoms: (Pt denies depressive symptoms) Substance abuse history and/or treatment for substance abuse?: Yes Suicide prevention information given to non-admitted patients: Not applicable  Risk to Others within the  past 6 months Homicidal Ideation: No Does patient have any lifetime risk of violence toward others beyond the six months prior to admission? : No Thoughts of Harm to Others: No Current Homicidal Intent: No Current Homicidal Plan: No Access to Homicidal Means: No Identified Victim: No one History of harm to others?: No Assessment of Violence: None Noted Violent Behavior Description: None reported Does patient have access to weapons?: No(Has a knife.) Criminal Charges Pending?: No Does patient have a court date: No Is patient on probation?: No  Psychosis Hallucinations: None noted Delusions: None noted  Mental Status Report Appearance/Hygiene: Disheveled, Poor hygiene Eye Contact: Good Motor Activity: Freedom of movement,  Unremarkable Speech: Tangential Level of Consciousness: Alert Mood: Helpless Affect: Appropriate to circumstance Anxiety Level: Moderate Thought Processes: Irrelevant, Tangential, Flight of Ideas Judgement: Impaired Orientation: Person, Place Obsessive Compulsive Thoughts/Behaviors: None  Cognitive Functioning Concentration: Poor Memory: Recent Impaired, Remote Impaired Is patient IDD: No Is patient DD?: No Insight: Poor Impulse Control: Poor Appetite: Fair Have you had any weight changes? : No Change Sleep: No Change Total Hours of Sleep: (Pt states he "sleeps good.") Vegetative Symptoms: Decreased grooming  ADLScreening [facility] Assessment Services) Patient's cognitive ability adequate to safely complete daily activities?: No Patient able to express need for assistance with ADLs?: Yes Independently performs ADLs?: Yes (appropriate for developmental age)  Prior Inpatient Therapy Prior Inpatient Therapy: Yes Prior Therapy Dates: Unknown Prior Therapy Facilty/Provider(s): Unknown Reason for Treatment: Unknown  Prior Outpatient Therapy Prior Outpatient Therapy: Yes Prior Therapy Dates: Current Prior Therapy Facilty/Provider(s): Counselor Charlott Rakes Reason for Treatment: therapy Does patient have an ACCT team?: No Does patient have Intensive In-House Services?  : No Does patient have [facility] services? : Unknown Does patient have P4CC services?: No  ADL Screening (condition at time of admission) Patient's cognitive ability adequate to safely complete daily activities?: No Is the patient deaf or have difficulty hearing?: No Does the patient have difficulty seeing, even when wearing glasses/contacts?: No Does the patient have difficulty concentrating, remembering, or making decisions?: Yes Patient able to express need for assistance with ADLs?: Yes Does the patient have difficulty dressing or bathing?: No Independently performs ADLs?: Yes (appropriate for developmental  age) Does the patient have difficulty walking or climbing stairs?: Yes Weakness of Legs: None Weakness of Arms/Hands: Right       Abuse/Neglect Assessment (Assessment to be complete while patient is alone) Abuse/Neglect Assessment Can Be Completed: Unable to assess, patient is non-responsive or altered mental status     Advance Directives (For Healthcare) Does Patient Have a Medical Advance Directive?: No Would patient like information on creating a medical advance directive?: No - Patient declined          Disposition:  Disposition Initial Assessment Completed for this Encounter: Yes Patient referred to: Other (Comment)([facility] )  This service was provided via telemedicine using a 2-way, interactive audio and video technology.  Names of all persons participating in this telemedicine service and their role in this encounter. Name: Shanard Treto Role: patient  Name: Beatriz Stallion, M.S. LCAS QP Role: clinician  Name:  Role:   Name: Role:     Alexandria Lodge 10/25/2017 9:29 PM

## 2017-10-25 NOTE — ED Provider Notes (Signed)
[facility] EMERGENCY DEPARTMENT Provider Note   CSN: [id number] Arrival date & time: 10/25/17  1554     History   Chief Complaint Chief Complaint  Patient presents with  . Manic Behavior  . IVC    HPI Jersey Ravenscroft is a 57 y.o. male who presents under IVC for mental health eval. PMH significant for A.fib with RVR, alcoholism, hx of seizures, hx of TBI and dementia, hx of depression with psychosis. The patient cannot provide any meaningful history. He was seen in the ED yesterday for chest pain and was noted to be confused at that time which is not new. 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 Cognition and behavior have changed drastically over the last 30 days. He is not longer able to contract for own safety of take care of hygiene. He was picked up by [employer] while wandering the streets, not knowing where he lived, very confused which causes aggression". [facility] him and he wasn't there and his room was in disarray. The patient tells me his name and where he is. He cannot tell me why he is here but states he's hungry.  HPI  Past Medical History:  Diagnosis Date  . Aneurysm (HCC)   . Atrial fibrillation with RVR (HCC) 03/03/2017  . ETOHism (HCC)   . Gait abnormality 05/26/2016  . Hepatitis C   . 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 Hypertension   . Seizure [facility])     Patient Active Problem List   Diagnosis Date Noted  . Insomnia 05/07/2017  . Atrial flutter (HCC) 03/03/2017  . Late effect of cerebrovascular accident (CVA) 01/21/2017  . Bipolar disorder (HCC) 01/21/2017  . Migraine headache 01/21/2017  . Atrial flutter with rapid ventricular response (HCC) 01/10/2017  . Syncope 01/10/2017  . Gait abnormality 05/26/2016  . Smoking 01/05/2015  . Chronic pain syndrome 01/05/2015  . Fall   . Paranoia (HCC)    . Dementia with behavioral disturbance   . Encephalopathy, hepatic (HCC) 10/09/2014  . 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 Seizures (HCC) 01/24/2014  . 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/23/2014  . Essential hypertension, benign 10/31/2012  . History of alcohol abuse 10/26/2012  . Chronic liver disease 10/26/2012    Past Surgical History:  Procedure Laterality Date  . A-FLUTTER ABLATION N/A 03/03/2017   Procedure: A-FLUTTER ABLATION;  Surgeon: Duke Salvia, MD;  Location: [facility] INVASIVE CV LAB;  Service: Cardiovascular;  Laterality: N/A;  . ATRIAL FLUTTER ABLATION  03/03/2017  . CEREBRAL ANEURYSM REPAIR     At [facility]  . HARDWARE REMOVAL Left 10/29/2012   Procedure: HARDWARE REMOVAL;  Surgeon: Sheral Apley, MD;  Location: [facility] OR;  Service: Orthopedics;  Laterality: Left;  . I&D EXTREMITY Left 10/26/2012   Procedure: IRRIGATION AND DEBRIDEMENT Left Elbow;  Surgeon: Sheral Apley, MD;  Location: [facility] OR;  Service: Orthopedics;  Laterality: Left;  . I&D EXTREMITY Left 10/29/2012   Procedure: IRRIGATION AND DEBRIDEMENT EXTREMITY, wound vac change, stimulan beads;  Surgeon: Sheral Apley, MD;  Location: [facility] OR;  Service: Orthopedics;  Laterality: Left;  lateral on bean bag  . I&D EXTREMITY Left 11/02/2012   Procedure: IRRIGATION AND DEBRIDEMENT EXTREMITY;  Surgeon: Sheral Apley, MD;  Location: [facility] OR;  Service: Orthopedics;  Laterality: Left;  . INCISION AND DRAINAGE ABSCESS Left 11/02/2012  Procedure: INCISION AND DRAINAGE ABSCESS;  Surgeon: Sheral Apley, MD;  Location: [facility] OR;  Service: Orthopedics;  Laterality: Left;        Home Medications    Prior to Admission medications   Medication Sig Start Date End Date Taking? Authorizing Provider  carbamazepine (TEGRETOL) 200 MG tablet 800mg  PO QD X 1D, then 600mg  PO QD X 1D, then 400mg  QD X 1D, then 200mg  PO QD X 2D Patient not taking: Reported on 10/25/2017 06/26/17   Liberty Handy, PA-C    Lacosamide 150 MG TABS Give 1 tablet by mouth two times daily Patient not taking: Reported on 10/25/2017 06/26/17   Liberty Handy, PA-C  levETIRAcetam (KEPPRA) 750 MG tablet Take 2 tablets (1,500 mg total) by mouth 2 (two) times daily. Patient not taking: Reported on 07/03/2017 06/26/17   Liberty Handy, PA-C  QUEtiapine (SEROQUEL) 200 MG tablet Take 1 tablet (200 mg total) by mouth daily. Patient not taking: Reported on 07/03/2017 06/26/17   Liberty Handy, PA-C  sertraline (ZOLOFT) 50 MG tablet Take 1 tablet (50 mg total) by mouth daily. Patient not taking: Reported on 10/25/2017 06/26/17   Liberty Handy, PA-C  SUMAtriptan (IMITREX) 50 MG tablet Take 1 tablet (50 mg total) by mouth See admin instructions. Give 1 tablet (50 mg) by mouth every 24 hours as needed for migraine headache;  May repeat in 2 hours if headache persists or recurs. Do not exceed 100 mg in 24 hours Patient not taking: Reported on 07/03/2017 06/26/17   Liberty Handy, PA-C  traMADol (ULTRAM) 50 MG tablet Take 1 tablet (50 mg total) by mouth every 6 (six) hours as needed. Patient not taking: Reported on 07/03/2017 05/26/17   Sharee Holster, NP  zolpidem (AMBIEN) 10 MG tablet Take 1 tablet (10 mg total) by mouth at bedtime. Patient not taking: Reported on 10/25/2017 05/26/17   Sharee Holster, NP    Family History Family History  Problem Relation Age of Onset  . Hypertension Mother   . Cancer Mother   . Hypertension Father   . Diabetes Sister   . Cancer Sister   . Cancer Sister     Social History Social History   Tobacco Use  . Smoking status: Current Every Day Smoker    Packs/day: 0.25    Types: Cigarettes  . Smokeless tobacco: Never Used  Substance Use Topics  . Alcohol use: No    Alcohol/week: 0.0 oz    Comment: quit drinking 11/2011 per patient  . Drug use: No     Allergies   Carbamazepine and Depakote [divalproex sodium]   Review of Systems Review of Systems  Unable to perform ROS:  Psychiatric disorder     Physical Exam Updated Vital Signs BP (!) 138/103 (BP Location: Left Arm)   Pulse (!) 144   Temp 97.9 F (36.6 C)   Resp 16   Ht 6\' 5"  (1.956 m)   Wt 83.9 kg (185 lb)   SpO2 100%   BMI 21.94 kg/m   Physical Exam  Constitutional: He appears well-developed and well-nourished. No distress.  Calm, cooperative. Disheveled. Able to tell me his name and that he's at the hospital.  HENT:  Head: Normocephalic and atraumatic.  Eyes: Pupils are equal, round, and reactive to light. Conjunctivae are normal. Right eye exhibits no discharge. Left eye exhibits no discharge. No scleral icterus.  Neck: Normal range of motion.  Cardiovascular: Normal rate and regular rhythm.  Pulmonary/Chest: Effort normal and  breath sounds normal. No respiratory distress.  Abdominal: Soft. Bowel sounds are normal. He exhibits no distension. There is no tenderness.  Neurological: He is alert. He is disoriented. GCS eye subscore is 4. GCS verbal subscore is 5. GCS motor subscore is 6.  Skin: Skin is warm and dry.  Psychiatric: His speech is normal. His affect is blunt. He is slowed.  Nursing note and vitals reviewed.    ED Treatments / Results  Labs (all labs ordered are listed, but only abnormal results are displayed) Labs Reviewed  COMPREHENSIVE METABOLIC PANEL - Abnormal; Notable for the following components:      Result Value   Potassium 3.2 (*)    CO2 20 (*)    Glucose, Bld 100 (*)    BUN 23 (*)    Creatinine, Ser 1.34 (*)    Total Protein 8.6 (*)    Total Bilirubin 1.5 (*)    GFR calc non Af Amer 57 (*)    Anion gap 16 (*)    All other components within normal limits  ACETAMINOPHEN LEVEL - Abnormal; Notable for the following components:   Acetaminophen (Tylenol), Serum <10 (*)    All other components within normal limits  CBG MONITORING, ED - Abnormal; Notable for the following components:   Glucose-Capillary 114 (*)    All other components within normal limits    ETHANOL  SALICYLATE LEVEL  CBC  RAPID URINE DRUG SCREEN, HOSP PERFORMED    EKG None  Radiology Dg Chest 2 View  Result Date: 10/25/2017 CLINICAL DATA:  Acute onset of altered mental status. Confusion. EXAM: CHEST - 2 VIEW COMPARISON:  Chest radiograph performed 01/10/2017 FINDINGS: The lungs are well-aerated and clear. There is no evidence of focal opacification, pleural effusion or pneumothorax. The heart is normal in size; the mediastinal contour is within normal limits. No acute osseous abnormalities are seen. IMPRESSION: No acute cardiopulmonary process seen. Electronically Signed   By: Roanna RaiderJeffery  Chang M.D.   On: 10/25/2017 00:09    Procedures Procedures (including critical care time)  Medications Ordered in ED Medications - No data to display   Initial Impression / Assessment and Plan / ED Course  I have reviewed the triage vital signs and the nursing notes.  Pertinent labs & imaging results that were available during my care of the patient were reviewed by me and considered in my medical decision making (see chart for details).  57 year old male presents under IVC by his counselor with concerns for his safety due to not taking his meds and deteriorating AMS. Initial HR was in the 140s. This was rechecked and normal. On my exam he had normal heart rate with irregular rhythm. Lungs CTA. Abdomen soft and non-tender. CBC is normal. CMP is remarkable for mild hypokalemia (3.2), AKI (23/1.34), mildly elevated anion gap (16). He does not appear psychotic on exam but is mildly confused, blunt, and withdrawn. Head CT obtained which is negative for acute changes. UDS positive for cocaine. He was evaluated by TTS who recommends geri-psych  Final Clinical Impressions(s) / ED Diagnoses   Final diagnoses:  Confusion  Depression, unspecified depression type  Hypokalemia    ED Discharge Orders    None       Bethel BornGekas, Otniel Hoe Marie, PA-C 10/25/17 2248    Charlynne PanderYao, David Hsienta, MD 10/26/17  [phone]

## 2017-10-26 DIAGNOSIS — F1414 Cocaine abuse with cocaine-induced mood disorder: Secondary | ICD-10-CM | POA: Diagnosis present

## 2017-10-26 NOTE — ED Notes (Signed)
Sitter at bedside; patient resting; notified patient he is to be discharged.

## 2017-10-26 NOTE — ED Notes (Addendum)
Pt resting.

## 2017-10-26 NOTE — Consult Note (Addendum)
[facility] Psych ED Discharge  10/26/2017 9:32 AM [name] [name]  MRN:  [id number] Principal Problem: Cocaine abuse with cocaine-induced mood disorder [facility]) Discharge Diagnoses:  Patient Active Problem List   Diagnosis Date Noted  . Cocaine abuse with cocaine-induced mood disorder [facility]) [F14.14] 10/26/2017    Priority: High  . Insomnia [G47.00] 05/07/2017  . Atrial flutter (HCC) [I48.92] 03/03/2017  . Late effect of cerebrovascular accident (CVA) [I69.30] 01/21/2017  . Bipolar disorder (HCC) [F31.9] 01/21/2017  . Migraine headache [G43.909] 01/21/2017  . Atrial flutter with rapid ventricular response (HCC) [I48.92] 01/10/2017  . Syncope [R55] 01/10/2017  . Gait abnormality [R26.9] 05/26/2016  . Smoking [F17.200] 01/05/2015  . Chronic pain syndrome [G89.4] 01/05/2015  . Fall [W19.XXXA]   . Paranoia (HCC) [F22]   . Dementia with behavioral disturbance [F03.91]   . Encephalopathy, hepatic (HCC) [K72.90] 10/09/2014  . Hereditary and idiopathic peripheral neuropathy [G60.9] 06/03/2014  . Seizures (HCC) [R56.9] 01/24/2014  . S/P cerebral aneurysm repair [W09.811, Z86.79] 01/23/2014  . Essential hypertension, benign [I10] 10/31/2012  . History of alcohol abuse [Z87.898] 10/26/2012  . Chronic liver disease [K76.9] 10/26/2012    Subjective: 57 yo male who presented to the ED from his ACT team due to a change in cognition and not caring for himself.  However, he has been using cocaine and difficult to ascertain the real issue of his lack of self care and change in cognition.  On assessment, he is clear and coherent with no suicidal/homicidal ideations, hallucinations, or withdrawal symptoms.  ACT team notified, stable for discharge.  Patient reviewed by Dr Sharma Covert who concurs with the treatment plan  Total Time spent with patient: 45 minutes  Past Psychiatric History: depression, substance abuse, dementia  Past Medical History:  Past Medical History:  Diagnosis Date  . Aneurysm (HCC)    . Atrial fibrillation with RVR (HCC) 03/03/2017  . ETOHism (HCC)   . Gait abnormality 05/26/2016  . Hepatitis C   . 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 Hypertension   . Seizure [facility])    Past Surgical History:  Procedure Laterality Date  . A-FLUTTER ABLATION N/A 03/03/2017   Procedure: A-FLUTTER ABLATION;  Surgeon: Duke Salvia, MD;  Location: [facility] INVASIVE CV LAB;  Service: Cardiovascular;  Laterality: N/A;  . ATRIAL FLUTTER ABLATION  03/03/2017  . CEREBRAL ANEURYSM REPAIR     At [facility]  . HARDWARE REMOVAL Left 10/29/2012   Procedure: HARDWARE REMOVAL;  Surgeon: Sheral Apley, MD;  Location: [facility] OR;  Service: Orthopedics;  Laterality: Left;  . I&D EXTREMITY Left 10/26/2012   Procedure: IRRIGATION AND DEBRIDEMENT Left Elbow;  Surgeon: Sheral Apley, MD;  Location: [facility] OR;  Service: Orthopedics;  Laterality: Left;  . I&D EXTREMITY Left 10/29/2012   Procedure: IRRIGATION AND DEBRIDEMENT EXTREMITY, wound vac change, stimulan beads;  Surgeon: Sheral Apley, MD;  Location: [facility] OR;  Service: Orthopedics;  Laterality: Left;  lateral on bean bag  . I&D EXTREMITY Left 11/02/2012   Procedure: IRRIGATION AND DEBRIDEMENT EXTREMITY;  Surgeon: Sheral Apley, MD;  Location: [facility] OR;  Service: Orthopedics;  Laterality: Left;  . INCISION AND DRAINAGE ABSCESS Left 11/02/2012   Procedure: INCISION AND DRAINAGE ABSCESS;  Surgeon: Sheral Apley, MD;  Location: [facility] OR;  Service: Orthopedics;  Laterality: Left;   Family History:  Family History  Problem Relation Age of Onset  . Hypertension Mother   . Cancer Mother   . Hypertension Father   . Diabetes Sister   .  Cancer Sister   . Cancer Sister    Family Psychiatric  History: see above  Social History:  Social History   Substance and Sexual Activity  Alcohol Use No  . Alcohol/week: 0.0 standard drinks   Comment: quit drinking 11/2011 per patient    Social History   Substance and Sexual Activity  Drug Use No    Social History   Socioeconomic History  . Marital status: Single    Spouse name: Not on file  . Number of children: 3  . Years of education: 75 th  . Highest education level: Not on file  Occupational History  . Occupation: disabled  Social Needs  . Financial resource strain: Not on file  . Food insecurity:    Worry: Not on file    Inability: Not on file  . Transportation needs:    Medical: Not on file    Non-medical: Not on file  Tobacco Use  . Smoking status: Current Every Day Smoker    Packs/day: 0.25    Types: Cigarettes  . Smokeless tobacco: Never Used  Substance and Sexual Activity  . Alcohol use: No    Alcohol/week: 0.0 standard drinks    Comment: quit drinking 11/2011 per patient  . Drug use: No  . Sexual activity: Not Currently  Lifestyle  . Physical activity:    Days per week: Not on file    Minutes per session: Not on file  . Stress: Not on file  Relationships  . Social connections:    Talks on phone: Not on file    Gets together: Not on file    Attends religious service: Not on file    Active member of club or organization: Not on file    Attends meetings of clubs or organizations: Not on file    Relationship status: Not on file  Other Topics Concern  . Not on file  Social History Narrative   He is currently a resident at [facility] facility     Has this patient used any form of tobacco in the last 30 days? (Cigarettes, Smokeless Tobacco, Cigars, and/or Pipes) A prescription for an FDA-approved tobacco cessation medication was offered at discharge and the patient refused  Current Medications: Current Facility-Administered Medications  Medication Dose Route Frequency Provider Last Rate Last Dose  . levETIRAcetam (KEPPRA) tablet 1,500 mg  1,500 mg Oral BID Bethel Born, PA-C   1,500 mg at 10/25/17 2132  . QUEtiapine (SEROQUEL) tablet 200 mg  200 mg Oral Daily Bethel Born, PA-C   200 mg at 10/25/17 2132  . sertraline (ZOLOFT) tablet 50 mg   50 mg Oral Daily Bethel Born, PA-C   50 mg at 10/25/17 2132   Current Outpatient Medications  Medication Sig Dispense Refill  . carbamazepine (TEGRETOL) 200 MG tablet 800mg  PO QD X 1D, then 600mg  PO QD X 1D, then 400mg  QD X 1D, then 200mg  PO QD X 2D (Patient not taking: Reported on 10/25/2017) 11 tablet 0  . Lacosamide 150 MG TABS Give 1 tablet by mouth two times daily (Patient not taking: Reported on 10/25/2017) 60 tablet 0  . levETIRAcetam (KEPPRA) 750 MG tablet Take 2 tablets (1,500 mg total) by mouth 2 (two) times daily. (Patient not taking: Reported on 07/03/2017) 30 tablet 0  . QUEtiapine (SEROQUEL) 200 MG tablet Take 1 tablet (200 mg total) by mouth daily. (Patient not taking: Reported on 07/03/2017) 15 tablet 0  . sertraline (ZOLOFT) 50 MG tablet Take 1 tablet (50 mg  total) by mouth daily. (Patient not taking: Reported on 10/25/2017) 15 tablet 0  . SUMAtriptan (IMITREX) 50 MG tablet Take 1 tablet (50 mg total) by mouth See admin instructions. Give 1 tablet (50 mg) by mouth every 24 hours as needed for migraine headache;  May repeat in 2 hours if headache persists or recurs. Do not exceed 100 mg in 24 hours (Patient not taking: Reported on 07/03/2017) 10 tablet 0  . traMADol (ULTRAM) 50 MG tablet Take 1 tablet (50 mg total) by mouth every 6 (six) hours as needed. (Patient not taking: Reported on 07/03/2017) 30 tablet 0  . zolpidem (AMBIEN) 10 MG tablet Take 1 tablet (10 mg total) by mouth at bedtime. (Patient not taking: Reported on 10/25/2017) 30 tablet 0   PTA Medications:  (Not in a hospital admission)  Musculoskeletal: Strength & Muscle Tone: within normal limits Gait & Station: normal Patient leans: N/A  Psychiatric Specialty Exam: Physical Exam  Nursing note and vitals reviewed. Constitutional: He is oriented to person, place, and time. He appears well-developed and well-nourished.  HENT:  Head: Normocephalic.  Neck: Normal range of motion.  Respiratory: Effort normal.   Musculoskeletal: Normal range of motion.  Neurological: He is alert and oriented to person, place, and time.  Psychiatric: His speech is normal and behavior is normal. Judgment and thought content normal. His affect is blunt. Cognition and memory are normal.    Review of Systems  Psychiatric/Behavioral: Positive for substance abuse.  All other systems reviewed and are negative.   Blood pressure (!) 153/94, pulse 86, temperature 98 F (36.7 C), temperature source Oral, resp. rate 18, height 6\' 5"  (1.956 m), weight 83.9 kg, SpO2 98 %.Body mass index is 21.94 kg/m.  General Appearance: Disheveled  Eye Contact:  Good  Speech:  Normal Rate  Volume:  Normal  Mood:  Euthymic  Affect:  Blunt  Thought Process:  Coherent and Descriptions of Associations: Intact  Orientation:  Full (Time, Place, and Person)  Thought Content:  WDL and Logical  Suicidal Thoughts:  No  Homicidal Thoughts:  No  Memory:  Immediate;   Good Recent;   Good Remote;   Good  Judgement:  Fair  Insight:  Fair  Psychomotor Activity:  Normal  Concentration:  Concentration: Good and Attention Span: Good  Recall:  Good  Fund of Knowledge:  Fair  Language:  Good  Akathisia:  No  Handed:  Right  AIMS (if indicated):     Assets:  Leisure Time Physical Health Resilience Social Support  ADL's:  Intact  Cognition:  WNL  Sleep:        Demographic Factors:  Male  Loss Factors: NA  Historical Factors: NA  Risk Reduction Factors:   Sense of responsibility to family, Living with another person, especially a relative, Positive social support and Positive therapeutic relationship  Continued Clinical Symptoms:  None  Cognitive Features That Contribute To Risk:  None    Suicide Risk:  Minimal: No identifiable suicidal ideation.  Patients presenting with no risk factors but with morbid ruminations; may be classified as minimal risk based on the severity of the depressive symptoms    Plan Of Care/Follow-up  recommendations:  Activity:  as tolerated Diet:  heart healthy diet  Disposition: discharge, follow up with ACT team Nanine MeansLORD, JAMISON, NP 10/26/2017, 9:32 AM   Agree with NP Assessment

## 2017-10-26 NOTE — ED Notes (Addendum)
PT awake and eating breakfast. Alert and cooperative. RN reoriented to day and time as patient states he likes to stay focused. Sitter at bedside.

## 2017-10-26 NOTE — ED Notes (Addendum)
ACT team called- no answer. Left voicemail he is being discharged and they can call [facility] ED if have questions. Bus pass given. Belongings returned. Non with security and no meds with pharmacy. IVC paperwork rescinded

## 2017-10-26 NOTE — Progress Notes (Signed)
Disposition CSW asked by  to contact patient's ACCT staff @[facility]  to come and pick Counselor is [name] RakesJack [name] ([phone] who is the [facility] staff person who sent the patient here for assessment.  Message left noting that patient is being d/c'd and asking him to come and pick pt up.  Disposition CSW will continue to follow until d/c.  [name] BernJean T. Kaylyn LimSutter, MSW, LCSWA Disposition Clinical Social Work [phone] (cell) [phone] (office)

## 2017-10-26 NOTE — ED Notes (Addendum)
Politely asked patient to get dressed since he is being discharged. He is refusing. Security called. Patient being verbally combative calling security "cracker"

## 2017-11-13 ENCOUNTER — Emergency Department (HOSPITAL_COMMUNITY)
Admission: EM | Admit: 2017-11-13 | Discharge: 2017-11-15 | Disposition: A | Discharge status: Home / Self Care | Payer: Medicaid Other | Attending: Emergency Medicine | Admitting: Emergency Medicine

## 2017-11-13 ENCOUNTER — Emergency Department (HOSPITAL_COMMUNITY): Payer: Medicaid Other

## 2017-11-13 ENCOUNTER — Other Ambulatory Visit: Payer: Self-pay

## 2017-11-13 DIAGNOSIS — F17210 Nicotine dependence, cigarettes, uncomplicated: Secondary | ICD-10-CM | POA: Insufficient documentation

## 2017-11-13 DIAGNOSIS — B1710 Acute hepatitis C without hepatic coma: Secondary | ICD-10-CM | POA: Diagnosis not present

## 2017-11-13 DIAGNOSIS — R4182 Altered mental status, unspecified: Secondary | ICD-10-CM | POA: Diagnosis present

## 2017-11-13 DIAGNOSIS — I10 Essential (primary) hypertension: Secondary | ICD-10-CM | POA: Insufficient documentation

## 2017-11-13 DIAGNOSIS — F0391 Unspecified dementia with behavioral disturbance: Secondary | ICD-10-CM | POA: Insufficient documentation

## 2017-11-13 LAB — COMPREHENSIVE METABOLIC PANEL
ALBUMIN: 3.5 g/dL (ref 3.5–5.0)
ALT: 16 U/L (ref 0–44)
ANION GAP: 10 (ref 5–15)
AST: 30 U/L (ref 15–41)
Alkaline Phosphatase: 70 U/L (ref 38–126)
BILIRUBIN TOTAL: 1.3 mg/dL — AB (ref 0.3–1.2)
BUN: 13 mg/dL (ref 6–20)
CO2: 24 mmol/L (ref 22–32)
Calcium: 9 mg/dL (ref 8.9–10.3)
Chloride: 101 mmol/L (ref 98–111)
Creatinine, Ser: 0.66 mg/dL (ref 0.61–1.24)
GFR calc Af Amer: >60 mL/min (ref >60)
GFR calc non Af Amer: >60 mL/min (ref >60)
GLUCOSE: 93 mg/dL (ref 70–99)
POTASSIUM: 3.7 mmol/L (ref 3.5–5.1)
Sodium: 135 mmol/L (ref 135–145)
TOTAL PROTEIN: 7.1 g/dL (ref 6.5–8.1)

## 2017-11-13 LAB — URINALYSIS, COMPLETE (UACMP) WITH MICROSCOPIC
Bacteria, UA: NONE SEEN
Bilirubin Urine: NEGATIVE
GLUCOSE, UA: NEGATIVE mg/dL
HGB URINE DIPSTICK: NEGATIVE
Ketones, ur: 5 mg/dL — AB
Leukocytes, UA: NEGATIVE
NITRITE: NEGATIVE
Protein, ur: NEGATIVE mg/dL
SPECIFIC GRAVITY, URINE: 1.011 (ref 1.005–1.030)
pH: 6 (ref 5.0–8.0)

## 2017-11-13 LAB — CBC WITH DIFFERENTIAL/PLATELET
ABS IMMATURE GRANULOCYTES: 0 10*3/uL (ref 0.0–0.1)
BASOS ABS: 0 10*3/uL (ref 0.0–0.1)
Basophils Relative: 0 %
Eosinophils Absolute: 0 10*3/uL (ref 0.0–0.7)
Eosinophils Relative: 1 %
HCT: 42.3 % (ref 39.0–52.0)
HEMOGLOBIN: 14.8 g/dL (ref 13.0–17.0)
IMMATURE GRANULOCYTES: 0 %
LYMPHS PCT: 58 %
Lymphs Abs: 4.2 10*3/uL — ABNORMAL HIGH (ref 0.7–4.0)
MCH: 33.4 pg (ref 26.0–34.0)
MCHC: 35 g/dL (ref 30.0–36.0)
MCV: 95.5 fL (ref 78.0–100.0)
Monocytes Absolute: 0.8 10*3/uL (ref 0.1–1.0)
Monocytes Relative: 10 %
NEUTROS ABS: 2.3 10*3/uL (ref 1.7–7.7)
Neutrophils Relative %: 31 %
Platelets: 179 10*3/uL (ref 150–400)
RBC: 4.43 MIL/uL (ref 4.22–5.81)
RDW: 11.5 % (ref 11.5–15.5)
WBC: 7.4 10*3/uL (ref 4.0–10.5)

## 2017-11-13 LAB — RAPID URINE DRUG SCREEN, HOSP PERFORMED
AMPHETAMINES: NOT DETECTED
BARBITURATES: NOT DETECTED
BENZODIAZEPINES: NOT DETECTED
Cocaine: NOT DETECTED
Opiates: NOT DETECTED
Tetrahydrocannabinol: NOT DETECTED

## 2017-11-13 LAB — AMMONIA: Ammonia: 28 umol/L (ref 9–35)

## 2017-11-13 LAB — PROTIME-INR
INR: 1.13
PROTHROMBIN TIME: 14.4 s (ref 11.4–15.2)

## 2017-11-13 LAB — ETHANOL

## 2017-11-13 LAB — APTT: APTT: 36 s (ref 24–36)

## 2017-11-13 LAB — CBG MONITORING, ED: Glucose-Capillary: 98 mg/dL (ref 70–99)

## 2017-11-13 MED ORDER — LEVETIRACETAM 750 MG PO TABS
1500.0000 mg | ORAL_TABLET | Freq: Two times a day (BID) | ORAL | Status: DC
  Administered 2017-11-13 – 2017-11-15 (×4): 1500 mg via ORAL
  Filled 2017-11-13 (×4): qty 2

## 2017-11-13 MED ORDER — LACOSAMIDE 50 MG PO TABS
100.0000 mg | ORAL_TABLET | Freq: Two times a day (BID) | ORAL | Status: DC
  Administered 2017-11-13 – 2017-11-15 (×4): 100 mg via ORAL
  Filled 2017-11-13 (×3): qty 2

## 2017-11-13 MED ORDER — DILTIAZEM HCL ER 120 MG PO CP24
120.0000 mg | ORAL_CAPSULE | Freq: Every day | ORAL | Status: DC
  Filled 2017-11-13: qty 1

## 2017-11-13 MED ORDER — SERTRALINE HCL 50 MG PO TABS
50.0000 mg | ORAL_TABLET | Freq: Every day | ORAL | Status: DC
  Administered 2017-11-13: 50 mg via ORAL
  Filled 2017-11-13 (×2): qty 1

## 2017-11-13 MED ORDER — SERTRALINE HCL 100 MG PO TABS
100.0000 mg | ORAL_TABLET | Freq: Every day | ORAL | Status: DC
  Administered 2017-11-14 – 2017-11-15 (×2): 100 mg via ORAL
  Filled 2017-11-13 (×4): qty 1

## 2017-11-13 MED ORDER — LACOSAMIDE 50 MG PO TABS
150.0000 mg | ORAL_TABLET | Freq: Two times a day (BID) | ORAL | Status: DC
  Filled 2017-11-13 (×2): qty 3

## 2017-11-13 MED ORDER — QUETIAPINE FUMARATE 50 MG PO TABS
50.0000 mg | ORAL_TABLET | Freq: Every day | ORAL | Status: DC
  Administered 2017-11-13 – 2017-11-14 (×2): 50 mg via ORAL
  Filled 2017-11-13 (×3): qty 1

## 2017-11-13 MED ORDER — QUETIAPINE FUMARATE 200 MG PO TABS
200.0000 mg | ORAL_TABLET | Freq: Every day | ORAL | Status: DC
  Administered 2017-11-14 – 2017-11-15 (×2): 200 mg via ORAL
  Filled 2017-11-13 (×3): qty 1

## 2017-11-13 MED ORDER — APIXABAN 2.5 MG PO TABS
2.5000 mg | ORAL_TABLET | Freq: Two times a day (BID) | ORAL | Status: DC
  Administered 2017-11-13 – 2017-11-15 (×4): 2.5 mg via ORAL
  Filled 2017-11-13 (×7): qty 1

## 2017-11-13 MED ORDER — VITAMIN D 1000 UNITS PO TABS
1000.0000 [IU] | ORAL_TABLET | Freq: Every day | ORAL | Status: DC
  Administered 2017-11-14 – 2017-11-15 (×2): 1000 [IU] via ORAL
  Filled 2017-11-13 (×3): qty 1

## 2017-11-13 MED ORDER — MELATONIN 3 MG PO TABS
3.0000 mg | ORAL_TABLET | Freq: Every day | ORAL | Status: DC
  Administered 2017-11-13 – 2017-11-14 (×2): 3 mg via ORAL
  Filled 2017-11-13 (×2): qty 1

## 2017-11-13 MED ORDER — METOPROLOL TARTRATE 25 MG PO TABS
25.0000 mg | ORAL_TABLET | Freq: Every day | ORAL | Status: DC
  Administered 2017-11-14 – 2017-11-15 (×2): 25 mg via ORAL
  Filled 2017-11-13 (×3): qty 1

## 2017-11-13 MED ORDER — SODIUM CHLORIDE 0.9 % IV SOLN: 1000.0000 mL | INTRAVENOUS | Status: DC

## 2017-11-13 MED ORDER — SODIUM CHLORIDE 0.9 % IV BOLUS (SEPSIS)
500.0000 mL | Freq: Once | INTRAVENOUS | Status: AC
  Administered 2017-11-13: 500 mL via INTRAVENOUS

## 2017-11-13 NOTE — ED Notes (Signed)
Asked pt for urine sample, pt states "I can't right now, I'm in too much pain"

## 2017-11-13 NOTE — ED Notes (Signed)
Pt pulled his IV access out.  States he is trying to figure out where he is.  He is putting his clothes back on.  He's made aware that he is in the ED and is waiting to have a place to stay.  Pt verbalized agreement.

## 2017-11-13 NOTE — Progress Notes (Signed)
CSW staffed pt's case with the CSW Asst Director who is agreeable with an attempt to place pt with an [name] if pt is agreeable due to the pt's issues with current difficulties with ADL's, and the pt's HX of dementia, TBI, depression with psychosis (per notes) and the pt's inability to thrive in the community.  Per the CSW Asst Director the pt must be willing to remain in the ED ad be agreeable to "sign over" his check and utilize his Medicaid for a long-term residential stay.  CSW review chart and noted that an FL-2 was completed on   [facility] where pt was not allowed back, per CSW note from 01/19/2017.  Pt was [facility], but notes are do not say for how long.  Per notes, [facility] had refused to allow the pt back because he was "too high-functioning" to be at their facility and the director had stated the director was "unsure" how the pt had been able to remain there as long as the pt had despite this.   CSW will continue to follow for D/C needs.  Dorothe PeaJonathan F. Enio Hornback, LCSW, LCAS, CSI Clinical Social Worker Ph: [phone]

## 2017-11-13 NOTE — Progress Notes (Signed)
CSW sent, with pt's verbal permission, [name] referrals out [name].  2nd shift ED CSW will leave handoff for 1st shift ED CSW.  CSW will continue to follow for D/C needs.  Dorothe PeaJonathan F. Oliva Montecalvo, LCSW, LCAS, CSI Clinical Social Worker Ph: [phone]

## 2017-11-13 NOTE — ED Notes (Addendum)
Breakfast orders placed, per pt. Request gravy biscuit with scrambled eggs and coffee THIS ORDER DELETE REGULAR DIET ORDERED

## 2017-11-13 NOTE — Progress Notes (Addendum)
Pt gave verabl permission for the CSW to contact all known contacts to assist the pt with contacting family and assisting with placement, including Miss Reita ClicheRhonda Thomas-Walker.  CSW contactedThomas-Walker,Rhondaat ph:[phone] stated in the previous notes from 7/31 that [facility] wanted her to get guardianship in order to appropriately since pt was his own legal guardian. This Clinical research associatewriter spoke with Miss Thomas-Walker,Rhonda at ph:[phone] on a previous visit and Miss Precious Reelhomas-Walker had expressed interest in helping the pt due to her knowing the pt before the pt's condition became so acute.  Miss Precious Reelhomas Walker is currently en route to visit the pt now.  EPD updated/RN updated at ph: [phone].  RN aware Miss Precious Reelhomas-Walker is en route.  CSW is referring pt to [name]'s facilities now on the hub.  CSW will continue to follow for D/C needs.  Dorothe PeaJonathan F. Macayla Ekdahl, LCSW, LCAS, CSI Clinical Social Worker Ph: [phone]

## 2017-11-13 NOTE — ED Notes (Signed)
Pt attempting to obtain urine specimen at this time.

## 2017-11-13 NOTE — Progress Notes (Addendum)
CSW called Jettie PaganKim Snipes on pt's contact list at ph: [phone] list and received the number for the psychotherapeutic services ACTT team crisis line at ph: [phone]  Housing Line -[phone] CST - [phone] and left a VM asking for a return call.  CSW spoke to the [facility] crisis line worker Chief Technology OfficerAngel, who stated the pt was in a facility at one time which, the [facility] worker believes was an [name], and had "done really well", but the pt had eventually refused to stay and had left.  Pt had an apartment at one point by himself but had done "really poorly", per the [facility] ACTT team member and per the worker the pt is doing "pooely" at the pt's motel, also.  Per the [facility] ACTT Team member Lawanna Kobusngel, the pt has a payee whom the worker believes pays the pt's motel room.  Lawanna Kobusngel stated she would update the [facility] supervisor Debbie tonight (8/26) and the CSW provided [facility] with the [facility] 1st shift ED CSW's phone number [phone]) for updates on 8/27.   CSW also contacted Thomas-Walker,Rhonda at ph: [phone]

## 2017-11-13 NOTE — Progress Notes (Addendum)
CSW completed the Vulnerability Index - Service Prioritization Decision Assistance Tool (VI-SPDAT) and will email the results with the pt's verbal permission to:  Bennita Curtain at [email or web address].  Miss Lorinda CreedCurtain is the MetallurgistHousing Support Specialist at [employer] [phone], Ext. [phone].  Pt scored A 9 OUT OF 17 which designated pt as in need for a assessment for Permanent Supportive Housing/Housing first.  CSW received pt's verbal permission to email Miss Curtain.  CSW called Miss Curtain and left a secure VM on her Secure VM inbox to contact the 1st shift ED CSW on 8/26 should the pt still be here.  CSW will continue to follow for D/C needs.  Dorothe PeaJonathan F. Kelsa Jaworowski, LCSW, LCAS, CSI Clinical Social Worker Ph: [phone]

## 2017-11-13 NOTE — ED Provider Notes (Signed)
[facility] EMERGENCY [facility]PARTMENT Provider Note   CSN: [id number] Arrival date & time: 11/13/17  1434     History   Chief Complaint Chief Complaint  Patient presents with  . Altered Mental Status    HPI Wilford CornerFennell Terry Veno is a 57 y.o. male.  HPI Per nursing notes, "[facility]. Reffered here for altered mental status and eval for SNF. Per EMs pt alert, oriented x4, answers question appropriately. Per caregiver, normally able to ambulate with cane but now unable to stand and pivot. Multiple falls over the last 3 weeks and increased confusion. Talking about events that haven't happened. Pt currently living in a hotel. EKG NSR, CBG 106, bp 130/91, 98HR, 95 %RA. Please contact care coordinator Eunice BlaseDebbie [phone] with questions/dispo information."  Pt states he is here because he had a seizure today.    He is complaining about his testicles.  He states his underwear is bunched up and is hurting him the way he is sitting.   "My balls hurt, they are too close together, it is the way I was sitting.  Pt states he has been falling. He does not elaborate more on that.  He denies any fevers.  He denies any headache.   Past Medical History:  Diagnosis Date  . Aneurysm (HCC)   . Atrial fibrillation with RVR (HCC) 03/03/2017  . ETOHism (HCC)   . Gait abnormality 05/26/2016  . Hepatitis C   . 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 Hypertension   . Seizure [facility](HCC)     Patient Active Problem List   Diagnosis Date Noted  . Cocaine abuse with cocaine-induced mood disorder (HCC) 10/26/2017  . Insomnia 05/07/2017  . Atrial flutter (HCC) 03/03/2017  . Late effect of cerebrovascular accident (CVA) 01/21/2017  . Bipolar disorder (HCC) 01/21/2017  . Migraine headache 01/21/2017  . Atrial flutter with rapid ventricular response (HCC) 01/10/2017  . Syncope 01/10/2017  . Gait abnormality 05/26/2016  . Smoking  01/05/2015  . Chronic pain syndrome 01/05/2015  . Fall   . Paranoia (HCC)   . Dementia with behavioral disturbance   . Encephalopathy, hepatic (HCC) 10/09/2014  . 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 Seizures (HCC) 01/24/2014  . 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/23/2014  . Essential hypertension, benign 10/31/2012  . History of alcohol abuse 10/26/2012  . Chronic liver disease 10/26/2012    Past Surgical History:  Procedure Laterality Date  . A-FLUTTER ABLATION N/A 03/03/2017   Procedure: A-FLUTTER ABLATION;  Surgeon: Duke SalviaKlein, Steven C, MD;  [facility];  Service: Cardiovascular;  Laterality: N/A;  . ATRIAL FLUTTER ABLATION  03/03/2017  . CEREBRAL ANEURYSM REPAIR     [facility]  . HARDWARE REMOVAL Left 10/29/2012   Procedure: HARDWARE REMOVAL;  Surgeon: Sheral Apleyimothy D Murphy, MD;  [facility];  Service: Orthopedics;  Laterality: Left;  . I&D EXTREMITY Left 10/26/2012   Procedure: IRRIGATION AND DEBRIDEMENT Left Elbow;  Surgeon: Sheral Apleyimothy D Murphy, MD;  Location: [facility] OR;  Service: Orthopedics;  Laterality: Left;  . I&D EXTREMITY Left 10/29/2012   Procedure: IRRIGATION AND DEBRIDEMENT EXTREMITY, wound vac change, stimulan beads;  Surgeon: Sheral Apleyimothy D Murphy, MD;  Location: [facility] OR;  Service: Orthopedics;  Laterality: Left;  lateral on bean bag  . I&D EXTREMITY Left 11/02/2012   Procedure: IRRIGATION AND DEBRIDEMENT EXTREMITY;  Surgeon: Sheral Apleyimothy D Murphy, MD;  Location: [facility] OR;  Service: Orthopedics;  Laterality: Left;  . INCISION AND  DRAINAGE ABSCESS Left 11/02/2012   Procedure: INCISION AND DRAINAGE ABSCESS;  Surgeon: Sheral Apley, MD;  Location: [facility] OR;  Service: Orthopedics;  Laterality: Left;        Home Medications    Prior to Admission medications   Medication Sig Start Date End Date Taking? Authorizing Provider  carbamazepine (TEGRETOL) 200 MG tablet 800mg  PO QD X 1D, then 600mg  PO QD X 1D, then 400mg  QD X 1D, then 200mg  PO QD X 2D Patient  not taking: Reported on 10/25/2017 06/26/17   Liberty Handy, PA-C  Lacosamide 150 MG TABS Give 1 tablet by mouth two times daily Patient not taking: Reported on 10/25/2017 06/26/17   Liberty Handy, PA-C  levETIRAcetam (KEPPRA) 750 MG tablet Take 2 tablets (1,500 mg total) by mouth 2 (two) times daily. Patient not taking: Reported on 07/03/2017 06/26/17   Liberty Handy, PA-C  QUEtiapine (SEROQUEL) 200 MG tablet Take 1 tablet (200 mg total) by mouth daily. Patient not taking: Reported on 07/03/2017 06/26/17   Liberty Handy, PA-C  sertraline (ZOLOFT) 50 MG tablet Take 1 tablet (50 mg total) by mouth daily. Patient not taking: Reported on 10/25/2017 06/26/17   Liberty Handy, PA-C  SUMAtriptan (IMITREX) 50 MG tablet Take 1 tablet (50 mg total) by mouth See admin instructions. Give 1 tablet (50 mg) by mouth every 24 hours as needed for migraine headache;  May repeat in 2 hours if headache persists or recurs. Do not exceed 100 mg in 24 hours Patient not taking: Reported on 07/03/2017 06/26/17   Liberty Handy, PA-C  traMADol (ULTRAM) 50 MG tablet Take 1 tablet (50 mg total) by mouth every 6 (six) hours as needed. Patient not taking: Reported on 07/03/2017 05/26/17   Sharee Holster, NP    Family History Family History  Problem Relation Age of Onset  . Hypertension Mother   . Cancer Mother   . Hypertension Father   . Diabetes Sister   . Cancer Sister   . Cancer Sister     Social History Social History   Tobacco Use  . Smoking status: Current Every Day Smoker    Packs/day: 0.25    Types: Cigarettes  . Smokeless tobacco: Never Used  Substance Use Topics  . Alcohol use: No    Alcohol/week: 0.0 standard drinks    Comment: quit drinking 11/2011 per patient  . Drug use: No     Allergies   Carbamazepine and Depakote [divalproex sodium]   Review of Systems Review of Systems  All other systems reviewed and are negative.    Physical Exam Updated Vital Signs BP (!) 140/98    Pulse 85   Temp 97.8 F (36.6 C) (Oral)   Resp 14   Ht 1.956 m (6\' 5" )   Wt 83 kg   SpO2 96%   BMI 21.70 kg/m   Physical Exam  Constitutional: No distress.  HENT:  Head: Normocephalic and atraumatic.  Right Ear: External ear normal.  Left Ear: External ear normal.  Eyes: Conjunctivae are normal. Right eye exhibits no discharge. Left eye exhibits no discharge. No scleral icterus.  Neck: Neck supple. No tracheal deviation present.  Cardiovascular: Normal rate, regular rhythm and intact distal pulses.  Pulmonary/Chest: Effort normal and breath sounds normal. No stridor. No respiratory distress. He has no wheezes. He has no rales.  Abdominal: Soft. Bowel sounds are normal. He exhibits no distension. There is no tenderness. There is no rebound and no guarding. Hernia confirmed negative  in the right inguinal area and confirmed negative in the left inguinal area.  Genitourinary: Penis normal. Right testis shows tenderness. Right testis shows no mass and no swelling. Left testis shows tenderness. Left testis shows no mass and no swelling. No penile erythema or penile tenderness. No discharge found.  Musculoskeletal: He exhibits no edema or tenderness.  Neurological: He is alert. No cranial nerve deficit (no facial droop, extraocular movements intact, no slurred speech) or sensory deficit. He exhibits normal muscle tone. He displays no seizure activity. GCS eye subscore is 4. GCS verbal subscore is 4. GCS motor subscore is 6.  Knows location and name, confused about the year  Skin: Skin is warm and dry. No rash noted.  Psychiatric: He has a normal mood and affect.  Nursing note and vitals reviewed.    ED Treatments / Results  Labs (all labs ordered are listed, but only abnormal results are displayed) Labs Reviewed  COMPREHENSIVE METABOLIC PANEL - Abnormal; Notable for the following components:      Result Value   Total Bilirubin 1.3 (*)    All other components within normal limits  CBC  WITH DIFFERENTIAL/PLATELET - Abnormal; Notable for the following components:   Lymphs Abs 4.2 (*)    All other components within normal limits  URINALYSIS, COMPLETE (UACMP) WITH MICROSCOPIC - Abnormal; Notable for the following components:   Ketones, ur 5 (*)    All other components within normal limits  APTT  PROTIME-INR  AMMONIA  RAPID URINE DRUG SCREEN, HOSP PERFORMED  ETHANOL  CBG MONITORING, ED    EKG EKG Interpretation  Date/Time:  Monday November 13 2017 14:58:34 EDT Ventricular Rate:  95 PR Interval:    QRS Duration: 89 QT Interval:  372 QTC Calculation: 468 R Axis:   39 Text Interpretation:  Sinus rhythm No significant change since last tracing Confirmed by Linwood Dibbles [phone]) on 11/13/2017 3:00:57 PM   Radiology Dg Chest 2 View  Result Date: 11/13/2017 CLINICAL DATA:  Confusion and AMS Pt. Brought to ED from [facility] appt. Reffered here for altered mental status and eval for Skilled Nurse Facility. Per caregiver, normally able to ambulate with cane but now unable to stand. EXAM: CHEST - 2 VIEW COMPARISON:  10/24/2017 FINDINGS: The heart is normal in size. The aorta is tortuous. The lungs are free of focal consolidations and pleural effusions. No pulmonary edema. Remote rib fractures. IMPRESSION: No evidence for acute cardiopulmonary abnormality. Electronically Signed   By: Norva Pavlov M.D.   On: 11/13/2017 16:43   Ct Head Wo Contrast  Result Date: 11/13/2017 CLINICAL DATA:  Multiple recent falls.  Altered mental status. EXAM: CT HEAD WITHOUT CONTRAST TECHNIQUE: Contiguous axial images were obtained from the base of the skull through the vertex without intravenous contrast. COMPARISON:  Head CT 10/25/2017 FINDINGS: Brain: There is no mass, hemorrhage or extra-axial collection. There is bifrontal and anterior left temporal encephalomalacia in a pattern consistent with remote traumatic brain injury. Mild ex vacuo dilatation of the lateral ventricles. There  is hypoattenuation of the periventricular white matter, most commonly indicating chronic ischemic microangiopathy. Vascular: No abnormal hyperdensity of the major intracranial arteries or dural venous sinuses. No intracranial atherosclerosis. Skull: The visualized skull base, calvarium and extracranial soft tissues are normal. Sinuses/Orbits: No fluid levels or advanced mucosal thickening of the visualized paranasal sinuses. No mastoid or middle ear effusion. The orbits are normal. IMPRESSION: 1. No acute intracranial abnormality. 2. 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 Electronically Signed  By: Deatra Robinson M.D.   On: 11/13/2017 16:31    Procedures Procedures (including critical care time)  Medications Ordered in ED Medications  sodium chloride 0.9 % bolus 500 mL (0 mLs Intravenous Stopped 11/13/17 1838)    Followed by  0.9 %  sodium chloride infusion (has no administration in time range)  Lacosamide TABS 150 mg (has no administration in time range)  levETIRAcetam (KEPPRA) tablet 1,500 mg (has no administration in time range)  QUEtiapine (SEROQUEL) tablet 200 mg (has no administration in time range)  sertraline (ZOLOFT) tablet 50 mg (has no administration in time range)     Initial Impression / Assessment and Plan / ED Course  I have reviewed the triage vital signs and the nursing notes.  Pertinent labs & imaging results that were available during my care of the patient were reviewed by me and considered in my medical decision making (see chart for details).  Clinical Course as of Nov 14 2110  Mon Nov 13, 2017  1806 No acute findings noted on head CT   [JK]  1806 Chest x-ray negative.   [JK]  1848 Labs and xrays reviewed.  No acute findings noted.      [JK]  1849 Social worker, Johnathan evaluating patient   [JK]  2039 Plan on assisted living placement.  Patient will hold in the ED   [JK]    Clinical Course User Index [JK] Linwood Dibbles, MD    Pt  presented to the ED with altered mental status complaint.  In the ED, pt is calm and appropriate.  He is answering questions clearly.  No signs of acute altered mental status.  Consistent with his dementia.  Social work involved. Will attempt to find appropriate placement as requested by his outpatient psychotherapeutic services appointment.  Final Clinical Impressions(s) / ED Diagnoses   Final diagnoses:  Dementia with behavioral disturbance, unspecified dementia type    ED Discharge Orders    None       Linwood Dibbles, MD 11/13/17 2112

## 2017-11-13 NOTE — ED Notes (Signed)
Pt yelling stating to take that out of his arm "it's cold".  Dr. Lynelle DoctorKnapp made aware.  Fluids d'cd after 500ml bolus infused.

## 2017-11-13 NOTE — ED Triage Notes (Signed)
Pt arrives [facility] EMS [facility]. Reffered here for altered mental status and eval for SNF. Per EMs pt alert, oriented x4, answers question appropriately. Per caregiver, normally able to ambulate with cane but now unable to stand and pivot. Multiple falls over the last 3 weeks and increased confusion. Talking about events that haven't happened. Pt currently living in a hotel. EKG NSR, CBG 106, bp 130/91, 98HR, 95 %RA. Please contact care coordinator Eunice BlaseDebbie [phone] with questions/dispo information.

## 2017-11-13 NOTE — Progress Notes (Signed)
PER [place] MUST pt's PASSR # is:   [id number]   CSW will create an FL-2 with pt's permission and refer pt out local [name] facilities.  CSW spoke to the pt who was agreeable to placement and to signing over his check to assist with paying for placement.   CSW spoke with pt by phone and confirmed pt's plan to be discharged to [name] to live at discharge.  CSW provided active listening and validated pt's concerns that he needed help with a place to go.   CSW will complete FL-2 and send referrals out to [name] facilities via the hub per pt's request.  Pt has been living independently at motels and shelters, per the pt, prior [facility].  CSW will continue to follow for D/C needs.  [name] PeaJonathan F. Ciarah Peace, LCSW, LCAS, CSI Clinical Social Worker Ph: [phone]

## 2017-11-13 NOTE — [place] FL2 (Signed)
[place] MEDICAID FL2 LEVEL OF CARE SCREENING TOOL     IDENTIFICATION  Patient Name: [name] [name] Birthdate: 1960-04-15 Sex: male Admission Date (Current Location): 11/13/2017  [place]  [place] [id number] Facility and Address:  [facility], [address], [place], [place] [address]      Provider Number: [id number]  Attending Physician Name and Address:  Linwood DibblesKnapp, Jon, MD  Relative Name and Phone Number:       Current Level of Care: Hospital Recommended Level of Care: Assisted Living Facility Prior Approval Number:    Date Approved/Denied:   PASRR Number: [id number]   Discharge Plan: (Assisted Living Facility)    Current Diagnoses: Patient Active Problem List   Diagnosis Date Noted  . Cocaine abuse with cocaine-induced mood disorder (HCC) 10/26/2017  . Insomnia 05/07/2017  . Atrial flutter (HCC) 03/03/2017  . Late effect of cerebrovascular accident (CVA) 01/21/2017  . Bipolar disorder (HCC) 01/21/2017  . Migraine headache 01/21/2017  . Atrial flutter with rapid ventricular response (HCC) 01/10/2017  . Syncope 01/10/2017  . Gait abnormality 05/26/2016  . Smoking 01/05/2015  . Chronic pain syndrome 01/05/2015  . Fall   . Paranoia (HCC)   . Dementia with behavioral disturbance   . Encephalopathy, hepatic (HCC) 10/09/2014  . 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 Seizures (HCC) 01/24/2014  . 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/23/2014  . Essential hypertension, benign 10/31/2012  . History of alcohol abuse 10/26/2012  . Chronic liver disease 10/26/2012    Orientation RESPIRATION BLADDER Height & Weight     Self, Situation(Fluctuating Orientation)  Normal Continent Weight: 182 lb 15.7 oz (83 kg) Height:  6\' 5"  (195.6 cm)  BEHAVIORAL SYMPTOMS/MOOD NEUROLOGICAL BOWEL NUTRITION STATUS      Continent Diet  AMBULATORY STATUS COMMUNICATION OF NEEDS Skin   Limited Assist Verbally Normal                       Personal Care Assistance Level of Assistance  Bathing, Dressing Bathing Assistance: Limited assistance   Dressing Assistance: Limited assistance     Functional Limitations Info             SPECIAL CARE FACTORS FREQUENCY  PT (By licensed PT), OT (By licensed OT)     PT Frequency: 5 OT Frequency: 5            Contractures Contractures Info: Not present    Additional Factors Info  Allergies   Allergies Info: Carbamazepine, Depakote Divalproex Sodium           Current Medications (11/13/2017):  This is the current hospital active medication list Current Facility-Administered Medications  Medication Dose Route Frequency Provider Last Rate Last Dose  . 0.9 %  sodium chloride infusion  1,000 mL Intravenous Continuous Linwood DibblesKnapp, Jon, MD       Current Outpatient Medications  Medication Sig Dispense Refill  . carbamazepine (TEGRETOL) 200 MG tablet 800mg  PO QD X 1D, then 600mg  PO QD X 1D, then 400mg  QD X 1D, then 200mg  PO QD X 2D (Patient not taking: Reported on 10/25/2017) 11 tablet 0  . Lacosamide 150 MG TABS Give 1 tablet by mouth two times daily (Patient not taking: Reported on 10/25/2017) 60 tablet 0  . levETIRAcetam (KEPPRA) 750 MG tablet Take 2 tablets (1,500 mg total) by mouth 2 (two) times daily. (Patient not taking: Reported on 07/03/2017) 30 tablet 0  . QUEtiapine (SEROQUEL) 200 MG tablet Take 1 tablet (200 mg  total) by mouth daily. (Patient not taking: Reported on 07/03/2017) 15 tablet 0  . sertraline (ZOLOFT) 50 MG tablet Take 1 tablet (50 mg total) by mouth daily. (Patient not taking: Reported on 10/25/2017) 15 tablet 0  . SUMAtriptan (IMITREX) 50 MG tablet Take 1 tablet (50 mg total) by mouth See admin instructions. Give 1 tablet (50 mg) by mouth every 24 hours as needed for migraine headache;  May repeat in 2 hours if headache persists or recurs. Do not exceed 100 mg in 24 hours (Patient not taking: Reported on 07/03/2017) 10 tablet 0  . traMADol  (ULTRAM) 50 MG tablet Take 1 tablet (50 mg total) by mouth every 6 (six) hours as needed. (Patient not taking: Reported on 07/03/2017) 30 tablet 0     Discharge Medications: Please see discharge summary for a list of discharge medications.  Relevant Imaging Results:  Relevant Lab Results:   Additional Information [id number]  Dorothe Pea Adarryl Goldammer, LCSWA

## 2017-11-13 NOTE — Progress Notes (Addendum)
Consult request has been received. CSW attempting to follow up at present time.  CSW was updated by pt's EPD who was awaiting results of the pt's CT scans/tests.  CSW called spoke to the pt by phone who stated, "I'm trying to get back to where I'm at".  CSW asked pt to explain and pt stated, "AA and NA, I'm trying to get get back to my treatment".    CSW asked pt where he is living and pt stated, "I was living in a motel".  CSW asked if pt can go back to his motel and pt stated, "I guess I can go back".  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  Pt also stated that "AA and NA were taking my blood and should be done about now".  Pt then stated, "I won't ever do that no more" and then when the CSW asked the pt to explain the pt replied, "I won't put no more narcotics in my arms, I'm lucky to be alive".  When the CSW asked if pt could return to [facility] again, the pt replied, "I guess they could put me back there".  Pt then went on to say he "had been getting blood transfusions like I'm getting now [place]".  At this point the pt continued giving answers that did not match up with the questions the CSW was posing to the pt.  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  CSW asked pt if he can "walk okay"  And pt replied, "Somewhat... And I hope to keep my health and I don't want to do that anymore".  Pt then stated, "I want someone to take these needles out of my room" and then asked the CSW if the CSW wanted to talk to them again", referring to the RN. CSW replied yes and the pt spoke to the pt's RN.  CSW updated the  RN.  CSW will continue to follow for D/C needs.  Dorothe PeaJonathan F. Jaxon Flatt, LCSW, LCAS, CSI Clinical Social Worker Ph: [phone]

## 2017-11-13 NOTE — ED Notes (Signed)
Pt in CT and xray at this time.

## 2017-11-13 NOTE — ED Notes (Signed)
Breakfast order placed Reg Diet 

## 2017-11-13 NOTE — ED Notes (Signed)
Pt care assumed, obtained verbal report.  Pt continues to complain about his IV and states that something is infusing.  Explained to pt there is nothing infusing and that he only has a saline lock.

## 2017-11-13 NOTE — ED Notes (Signed)
[place] sandwich bag given to pt Call light within reach

## 2017-11-13 NOTE — Clinical Social Work Note (Signed)
Clinical Social Work Assessment  Patient Details  Name: [name] [name] Terry Sweaney MRN: [id number] Date of Birth: 07/11/1960  Date of referral:  11/13/17               Reason for consult:  Facility Placement                Permission sought to share information with:  Facility Industrial/product designerContact Representative Permission granted to share information::  Yes, Verbal Permission Granted  Name::        Agency::     Relationship::     Contact Information:     Housing/Transportation Living arrangements for the past 2 months:  Hotel/Motel, Homeless Source of Information:  Patient(ACTT Team and chart) Patient Interpreter Needed:  None Criminal Activity/Legal Involvement Pertinent to Current Situation/Hospitalization:    Significant Relationships:  Mental Health Provider Lives with:  Self Do you feel safe going back to the place where you live?  No Need for family participation in patient care:  No (Coment)  Care giving concerns:  Pt and ACTT team now state pt cannot walk/pivot and has had multiple falls recently and pt was recently found wandering aimlessly per pt's ACTT Team. Pt is agreeable to placement and to signing over his check for [name] placement.   Social Worker assessment / plan:  CSW spoke with pt by phone and confirmed pt's plan to be discharged to [name] to live at discharge.  CSW provided active listening and validated pt's concerns that he needed help with a place to go.   CSW will complete FL-2 and send referrals out to [name] facilities via the hub per pt's request. Pt has been living independently at motels and shelters, per the pt, prior [facility].  Employment status:  Unemployed Health and safety inspectornsurance information:  [place] PT Recommendations:    Information / Referral to community resources:     Patient/Family's Response to care:  Patient alert and oriented but orientation presents as fluctuating at times.  Patient agreeable to plan.  Pt's PSI ACTT team Eunice Blase(Debbie) supportive  and strongly involved in pt.'s care.  Pt.'s ACTT team pleasant and appreciated CSW intervention.    Patient/Family's Understanding of and Emotional Response to Diagnosis, Current Treatment, and Prognosis:  CSW called pt's mother at ph: [phone] and was told that this was the wrong number. CSW contacted [name],[name] at ph: [phone] who stated in the previous notes from 7/31 that APS wanted her to get guardianship in order to appropriately since pt was his own legal guardian. This Clinical research associatewriter spoke with Miss [name],[name] at ph: [phone].    Emotional Assessment Appearance:    Attitude/Demeanor/Rapport:    Affect (typically observed):  Accepting, Adaptable, Afraid/Fearful, Anxious Orientation:  Fluctuating Orientation (Suspected and/or reported Sundowners) Alcohol / Substance use:    Psych involvement (Current and /or in the community):     Discharge Needs  Concerns to be addressed:  Mental Health Concerns Readmission within the last 30 days:    Current discharge risk:  None Barriers to Discharge:  No Barriers Identified   Mercy RidingJonathan F Tc Kapusta, LCSWA 11/13/2017, 7:58 PM

## 2017-11-14 MED ORDER — NICOTINE 14 MG/24HR TD PT24
14.0000 mg | MEDICATED_PATCH | Freq: Once | TRANSDERMAL | Status: AC
  Administered 2017-11-14: 14 mg via TRANSDERMAL
  Filled 2017-11-14: qty 1

## 2017-11-14 MED ORDER — DILTIAZEM HCL ER COATED BEADS 120 MG PO CP24
120.0000 mg | ORAL_CAPSULE | Freq: Every day | ORAL | Status: DC
  Administered 2017-11-14 – 2017-11-15 (×2): 120 mg via ORAL
  Filled 2017-11-14 (×3): qty 1

## 2017-11-14 NOTE — ED Notes (Signed)
Pt. Showered and socks and cloths changed. New bedding placed on mattress.

## 2017-11-14 NOTE — ED Notes (Signed)
Tegretol was tapered down and pt. No longer on medication per pharmacy.

## 2017-11-14 NOTE — Progress Notes (Addendum)
9:1407am- CSW spoke with Debbie [phone] from pt's PSI Team and was informed that pt was suppose to be going to [name]. CSW spoke with MVHQIO([phone] from [facility] and was informed that they are expecting pt today around 2pm. CSW was advised that facility would need chest x-ray and diet orders. CSW has obtained both orders at this time. CSW informed Eunice BlaseDebbie of this. CSW was advised by Eunice Blaseebbie that pt was unable to ambulate as of yesterday therefore Eunice BlaseDebbie sought further information on that. Debbie expressed that if pt is unable to walk either pt needs a wheelchair or would need to be placed in SNF. CSW expressed that [facility].CSW spoke with RN and was informed that pt has been unable to ambualte. RN and NT attempted to walk pt and pt declined walking. Pt was also unsteady on feet during this time. MD Lynelle DoctorKnapp has placed PT consult for pt at this time. CSW has paged PT at this time.   8:02am- CSW spoke with pt at bedside. Pt remains agreeable to CSW faxing out to other facilities. [facility] to follow up with CSW for any further needed information.   CSW received handoff via first shift ED CSW. CSW has reached out to one [name] at this time and will continue to follow up with other [name]'s once they are open for the day.   Claude MangesKierra S. Caleyah Jr, MSW, LCSW-A Emergency Department Clinical Social Worker [phone]

## 2017-11-14 NOTE — Evaluation (Signed)
Physical Therapy Evaluation Patient Details Name: [name] [name] MRN: [id number] DOB: 06-Apr-1960 Today's Date: 11/14/2017   History of Present Illness  Pt is a 57 y/o male admitted secondary to AMS and multiple falls. CT negative for acute abnormality, however, did show encephalomacia likely due to remote TBI. PMH includes a fib, alcohol abuse, HTN, seizures, hepatitis C, and dementia.   Clinical Impression  Pt presenting to ED with problem above with deficits below. Pt presenting with weakness, decreased balance, and cognitive deficits. Required cues to stay on task. Performed sit<>Stand X2 with mod A and pt heavily reliant on UE support. Unable to use urinal in standing, secondary to inability to release RW. Feel pt is at increased risk for falls and will require increased support to perform mobility tasks. Will continue to follow acutely to maximize functional mobility independence and safety.     Follow Up Recommendations SNF;Supervision/Assistance - 24 hour    Equipment Recommendations  Wheelchair (measurements PT)    Recommendations for Other Services       Precautions / Restrictions Precautions Precautions: Fall Restrictions Weight Bearing Restrictions: No      Mobility  Bed Mobility Overal bed mobility: Needs Assistance Bed Mobility: Supine to Sit     Supine to sit: Mod assist     General bed mobility comments: Mod A for trunk elevation. Increased time required to perform bed mobility.   Transfers Overall transfer level: Needs assistance Equipment used: Rolling walker (2 wheeled) Transfers: Sit to/from Stand Sit to Stand: Mod assist         General transfer comment: Pt performed sit<>stand X2. Attempted to use urinal, however, heavily reliant on UE support, and unable to release 1 UE to hold urinal. Pt "locking out" knees for balance. Unsafe to attempt ambulation.   Ambulation/Gait             General Gait Details: unable   Stairs             Wheelchair Mobility    Modified Rankin (Stroke Patients Only)       Balance Overall balance assessment: Needs assistance Sitting-balance support: No upper extremity supported;Feet supported Sitting balance-[name]: Fair     Standing balance support: Bilateral upper extremity supported;During functional activity Standing balance-[name]: Poor Standing balance comment: Heavily reliant on UE support, even in static standing.                              Pertinent Vitals/Pain Pain Assessment: No/denies pain    Home Living Family/patient expects to be discharged to:: Unsure                 Additional Comments: Pt unable to remember previous home environment and states "I was probably working." However, per [employer], [place]     Prior Function Level of Independence: Independent with assistive device(s)         Comments: Pt reports he uses cane for ambulation.      Hand Dominance        Extremity/Trunk Assessment   Upper Extremity Assessment Upper Extremity Assessment: Generalized weakness    Lower Extremity Assessment Lower Extremity Assessment: Generalized weakness    Cervical / Trunk Assessment Cervical / Trunk Assessment: Kyphotic  Communication   Communication: No difficulties  Cognition Arousal/Alertness: Awake/alert Behavior During Therapy: WFL for tasks assessed/performed Overall Cognitive Status: No family/caregiver present to determine baseline cognitive functioning  General Comments: Per chart, pt with dementia. Pt with short term memory deficits and slowed processing. Disoriented to year and situation.       General Comments      Exercises     Assessment/Plan    PT Assessment Patient needs continued PT services  PT Problem List Decreased strength;Decreased balance;Decreased mobility;Decreased cognition;Decreased knowledge of use of DME;Decreased safety  awareness;Decreased knowledge of precautions       PT Treatment Interventions DME instruction;Gait training;Therapeutic exercise;Functional mobility training;Therapeutic activities;Balance training;Patient/family education    PT Goals (Current goals can be found in the Care Plan section)  Acute Rehab PT Goals Patient Stated Goal: none stated  PT Goal Formulation: Patient unable to participate in goal setting Time For Goal Achievement: 11/28/17 Potential to Achieve Goals: Good    Frequency Min 2X/week   Barriers to discharge Decreased caregiver support      Co-evaluation               AM-PAC PT "6 Clicks" Daily Activity  Outcome Measure Difficulty turning over in bed (including adjusting bedclothes, sheets and blankets)?: A Little Difficulty moving from lying on back to sitting on the side of the bed? : Unable Difficulty sitting down on and standing up from a chair with arms (e.g., wheelchair, bedside commode, etc,.)?: Unable Help needed moving to and from a bed to chair (including a wheelchair)?: A Lot Help needed walking in hospital room?: Total Help needed climbing 3-5 steps with a railing? : Total 6 Click Score: 9    End of Session Equipment Utilized During Treatment: Gait belt Activity Tolerance: Patient tolerated treatment well Patient left: in bed;with call bell/phone within reach(sitting EOB ) Nurse Communication: Mobility status;Other (comment)(pt sitting at EOB ) PT Visit Diagnosis: Unsteadiness on feet (R26.81);History of falling (Z91.81);Muscle weakness (generalized) (M62.81);Repeated falls (R29.6);Other abnormalities of gait and mobility (R26.89)    Time: 5409-8119 PT Time Calculation (min) (ACUTE ONLY): 20 min   Charges:   PT Evaluation $PT Eval Moderate Complexity: 1 Mod          Gladys Damme, PT, DPT  Acute Rehabilitation Services  Pager: [phone]   Lehman Prom 11/14/2017, 11:19 AM

## 2017-11-14 NOTE — ED Notes (Signed)
Meal tray ordered 

## 2017-11-14 NOTE — ED Notes (Signed)
ED Provider at bedside. 

## 2017-11-14 NOTE — ED Provider Notes (Signed)
Pt will be taken to an assisted living center   [name] [name], [name] Nevitt, MD 11/14/17 [phone]

## 2017-11-14 NOTE — ED Notes (Signed)
Notified pharmacy again for missing Eliquis

## 2017-11-14 NOTE — ED Notes (Signed)
Pt states that he is cold, given a warm blanket.

## 2017-11-14 NOTE — ED Notes (Signed)
Got patient a warm blanket patient is resting with call bell in reach  

## 2017-11-14 NOTE — Progress Notes (Signed)
CSW has faxed information to [facility] at this time. Pt still waiting to be seen by PT.   Claude MangesKierra S. Rukaya Kleinschmidt, MSW, LCSW-A Emergency Department Clinical Social Worker [phone]

## 2017-11-14 NOTE — Progress Notes (Addendum)
1:10pm-CSW now being informed that pt is not appropriate for SNF. CSW has reached back out to [name] [facility] for further details on what they can offer pt regarding therapies.   12:46pm-CSW received call back from Debbie with PSI ACT Team. CSW updated her on PT recommending SNF for pt at this time. Debbie asked that wherever pt goes to please inform PSI of this. CSW agreeable to do so.  11:57am-CSW Zack who informed CSW to go ahead and search for SNF's for pt. CSW has faxed pt out to SNF's at this time.CSW attempted to reach out to [place] listed in chart-CSW left voicemail at this time asking that she call CSW.   CSW aware that pt is now in need of SNF placement. CSW will speak with pt about SNF placement options. CSW attempted to reach out to Debbie with pt's PSI ACT team-left VM informing her of recommendation at this time. CSW did update Malachi BondsGloria with [facility] that pt is now needing SNF.   Claude MangesKierra S. Preslea Rhodus, MSW, LCSW-A Emergency Department Clinical Social Worker [phone]

## 2017-11-14 NOTE — Progress Notes (Addendum)
CSW spoke with [name] [name] with pt's PSI ACT Team. [name] [name] agreed to pt going to [name] [facility]. Per [facility] pt can be sent tomorrow. CSW will leave handoff for daytime CSW to arrange transportation and update PSI ACT Team when pt leaves.   [facility] and [facility] team about providing pt with a walker.   5:15 PM CSW updated pt that he has been accepted at [facility] and will be going tomorrow. Pt agreeable. Pt has clothing at [employer]. Pt does not have anyone to pick up his clothing. CSW left voicemail for [name] [name] to see if she is able to pick up pt's clothing.   [name] [name], [name] LayLCSWA [facility]  [phone]

## 2017-11-14 NOTE — ED Notes (Signed)
Pt requesting Tegretol, notified ED MD and pharmacy to verify that pt needs this medication per MD request.

## 2017-11-14 NOTE — Progress Notes (Signed)
CSW spoke with MD. MD and CSW spoke with [name] [name] at bedside. [name] [name] was able to ambulate with walker at this time. CSW has been made aware that [name] [name]  appropriate for SNF therefore suggested that CSW look back at [name]. CSW has reached back out to VillarrealGloria with [facility] and left voicemail for her to call CSW back. CSW also reached out to Debbie [name] [name]'s ACT Team to give update. CSW will follow for call back.   Claude MangesKierra S. Jhordyn Hoopingarner, MSW, LCSW-A Emergency Department Clinical Social Worker [phone]

## 2017-11-14 NOTE — ED Notes (Signed)
Pt refusing to walk to RR, taken in wheelchair.

## 2017-11-14 NOTE — NC FL2 (Addendum)
[place] MEDICAID FL2 LEVEL OF CARE SCREENING TOOL     IDENTIFICATION  Patient Name: [name] [name] Birthdate: [birth year] Sex: male Admission Date (Current Location): 11/13/2017  [place] and [place] Number:  Haynes Bast [id number] R Facility and Address:  [facility], [address] [place], [place] [address]      Provider Number: [id number]  Attending Physician Name and Address:  Default, Provider, MD  Relative Name and Phone Number:       Current Level of Care: Hospital Recommended Level of Care: Skilled Nursing Facility Prior Approval Number:    Date Approved/Denied:   PASRR Number: [id number]   Discharge Plan: (Assisted Living Facility)    Current Diagnoses: Patient Active Problem List   Diagnosis Date Noted  . Cocaine abuse with cocaine-induced mood disorder (HCC) 10/26/2017  . Insomnia 05/07/2017  . Atrial flutter (HCC) 03/03/2017  . Late effect of cerebrovascular accident (CVA) 01/21/2017  . Bipolar disorder (HCC) 01/21/2017  . Migraine headache 01/21/2017  . Atrial flutter with rapid ventricular response (HCC) 01/10/2017  . Syncope 01/10/2017  . Gait abnormality 05/26/2016  . Smoking 01/05/2015  . Chronic pain syndrome 01/05/2015  . Fall   . Paranoia (HCC)   . Dementia with behavioral disturbance   . Encephalopathy, hepatic (HCC) 10/09/2014  . 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 Seizures (HCC) 01/24/2014  . 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/23/2014  . Essential hypertension, benign 10/31/2012  . History of alcohol abuse 10/26/2012  . Chronic liver disease 10/26/2012    Orientation RESPIRATION BLADDER Height & Weight     Self, Time, Situation, Place(varies with pt. )  Normal Continent Weight: 182 lb 15.7 oz (83 kg) Height:  6\' 5"  (195.6 cm)  BEHAVIORAL SYMPTOMS/MOOD NEUROLOGICAL BOWEL NUTRITION STATUS      Continent Diet(please see AVS.)  AMBULATORY STATUS COMMUNICATION OF NEEDS Skin    Extensive Assist Verbally Normal                       Personal Care Assistance Level of Assistance  Bathing, Feeding, Dressing Bathing Assistance: Maximum assistance Feeding assistance: Limited assistance Dressing Assistance: Maximum assistance     Functional Limitations Info  Sight, Hearing, Speech Sight Info: Adequate Hearing Info: Adequate Speech Info: Adequate    SPECIAL CARE FACTORS FREQUENCY  PT (By licensed PT), OT (By licensed OT)     PT Frequency: 5 times week  OT Frequency: 5 times a week             Contractures Contractures Info: Not present    Additional Factors Info  Code Status, Allergies Code Status Info: Full  Allergies Info: Carbamazepine, Depakote Divalproex Sodium           Current Medications (11/14/2017):  This is the current hospital active medication list Current Facility-Administered Medications  Medication Dose Route Frequency Provider Last Rate Last Dose  . apixaban (ELIQUIS) tablet 2.5 mg  2.5 mg Oral BID Linwood Dibbles, MD   2.5 mg at 11/13/17 2303  . cholecalciferol (VITAMIN D) tablet 1,000 Units  1,000 Units Oral Daily Linwood Dibbles, MD   1,000 Units at 11/14/17 1121  . diltiazem (CARDIZEM CD) 24 hr capsule 120 mg  120 mg Oral Daily Beard, Samantha N, DO   120 mg at 11/14/17 1119  . lacosamide (VIMPAT) tablet 100 mg  100 mg Oral BID Linwood Dibbles, MD   100 mg at 11/14/17 1122  . levETIRAcetam (KEPPRA) tablet 1,500 mg  1,500 mg Oral BID  Linwood DibblesKnapp, Jon, MD   1,500 mg at 11/14/17 1120  . Melatonin TABS 3 mg  3 mg Oral QHS Linwood DibblesKnapp, Jon, MD   3 mg at 11/13/17 2303  . metoprolol tartrate (LOPRESSOR) tablet 25 mg  25 mg Oral Daily Linwood DibblesKnapp, Jon, MD   25 mg at 11/14/17 1120  . nicotine (NICODERM CQ - dosed in mg/24 hours) patch 14 mg  14 mg Transdermal Once Dione BoozeGlick, David, MD   14 mg at 11/14/17 0328  . QUEtiapine (SEROQUEL) tablet 200 mg  200 mg Oral Daily Linwood DibblesKnapp, Jon, MD   200 mg at 11/14/17 1121  . QUEtiapine (SEROQUEL) tablet 50 mg  50 mg Oral QHS Linwood DibblesKnapp,  Jon, MD   50 mg at 11/13/17 2257  . sertraline (ZOLOFT) tablet 100 mg  100 mg Oral Daily Linwood DibblesKnapp, Jon, MD   100 mg at 11/14/17 1123   Current Outpatient Medications  Medication Sig Dispense Refill  . apixaban (ELIQUIS) 2.5 MG TABS tablet Take 2.5 mg by mouth 2 (two) times daily.    . cholecalciferol (VITAMIN D) 1000 units tablet Take 1,000 Units by mouth daily.    Marland Kitchen. diltiazem (DILACOR XR) 120 MG 24 hr capsule Take 120 mg by mouth daily.    . Lacosamide 100 MG TABS Take 100 mg by mouth 2 (two) times daily.     Marland Kitchen. levETIRAcetam (KEPPRA) 750 MG tablet Take 2 tablets (1,500 mg total) by mouth 2 (two) times daily. 30 tablet 0  . Melatonin 3 MG TABS Take 3 mg by mouth at bedtime.    . metoprolol tartrate (LOPRESSOR) 25 MG tablet Take 25 mg by mouth daily.    . QUEtiapine (SEROQUEL) 50 MG tablet Take 50 mg by mouth at bedtime.    . sertraline (ZOLOFT) 100 MG tablet Take 100 mg by mouth daily.    . carbamazepine (TEGRETOL) 200 MG tablet 800mg  PO QD X 1D, then 600mg  PO QD X 1D, then 400mg  QD X 1D, then 200mg  PO QD X 2D (Patient not taking: Reported on 10/25/2017) 11 tablet 0  . SUMAtriptan (IMITREX) 50 MG tablet Take 1 tablet (50 mg total) by mouth See admin instructions. Give 1 tablet (50 mg) by mouth every 24 hours as needed for migraine headache;  May repeat in 2 hours if headache persists or recurs. Do not exceed 100 mg in 24 hours (Patient not taking: Reported on 07/03/2017) 10 tablet 0  . traMADol (ULTRAM) 50 MG tablet Take 1 tablet (50 mg total) by mouth every 6 (six) hours as needed. (Patient not taking: Reported on 07/03/2017) 30 tablet 0     Discharge Medications: Please see discharge summary for a list of discharge medications.  Relevant Imaging Results:  Relevant Lab Results:   Additional Information SSN- [id number]. Pt has a PSI ACT Team.   Robb MatarKierra S Kailan Laws, LCSWA

## 2017-11-14 NOTE — Discharge Instructions (Signed)
Patient may have a REGULAR DIET

## 2017-11-14 NOTE — Progress Notes (Signed)
CSW spoke with Malachi BondsGloria from [facility] and was informed that they are able to take pt tomorrow instead of today. CSW will handoff to evening CSW as needed to update Debbie of this and to see if they would like to continue with plan for pt to go to [facility].   Claude MangesKierra S. Suzann Lazaro, MSW, LCSW-A Emergency Department Clinical Social Worker [phone]

## 2017-11-15 NOTE — ED Notes (Signed)
Ride from [facility]  Assist living here to get pt, pt assisted to w/c given belongings and, including cane and walker and pt d/c

## 2017-11-15 NOTE — ED Notes (Signed)
Pt assisted to BR.

## 2017-11-15 NOTE — ED Notes (Signed)
SW here to give me pts AVS, she is arranging transport to [facility] here in [place] ? taxi

## 2017-11-15 NOTE — Progress Notes (Addendum)
10:45am-CSW spoke with Malachi BondsGloria from [facility] and was informed that they an take to now. CSW spoke with Eunice Blaseebbie from pt's ACT team and was informed that someone would be to get pt no later than 11:30am today. CSW has updated MD Jeraldine Loots([place]) and RN at this time. RN to call report to [phone]. There are no further CSW needs. CSW will sign off.   9:59am- CSW left voicemail for [place] at this time.   8:14am- CSW followed up with Malachi BondsGloria from [facility] to get a time that pt can come to facility. CSW attempted to follow up with Debbie to alert of a time and to arrange transport for pt to facility.  CSW aware that pt can go to [facility] today. CSW has faxed over information to [place] with [facility]. CSW to follow up with Debbie from pt's PSI Team to see who and what time someone will be able to transport pt to the facility.   Claude MangesKierra S. Ruari Duggan, MSW, LCSW-A Emergency Department Clinical Social Worker [phone]

## 2017-11-17 ENCOUNTER — Encounter (HOSPITAL_COMMUNITY): Payer: Self-pay

## 2017-11-17 ENCOUNTER — Emergency Department (HOSPITAL_COMMUNITY)
Admission: EM | Admit: 2017-11-17 | Discharge: 2017-11-18 | Disposition: A | Discharge status: Skilled Nursing Facility | Payer: Medicaid Other | Attending: Emergency Medicine | Admitting: Emergency Medicine

## 2017-11-17 DIAGNOSIS — I10 Essential (primary) hypertension: Secondary | ICD-10-CM | POA: Diagnosis not present

## 2017-11-17 DIAGNOSIS — F17210 Nicotine dependence, cigarettes, uncomplicated: Secondary | ICD-10-CM | POA: Insufficient documentation

## 2017-11-17 DIAGNOSIS — R4182 Altered mental status, unspecified: Secondary | ICD-10-CM | POA: Diagnosis present

## 2017-11-17 DIAGNOSIS — F0390 Unspecified dementia without behavioral disturbance: Secondary | ICD-10-CM | POA: Diagnosis not present

## 2017-11-17 DIAGNOSIS — Z79899 Other long term (current) drug therapy: Secondary | ICD-10-CM | POA: Diagnosis not present

## 2017-11-17 NOTE — ED Triage Notes (Signed)
Pt arrived by EMS [place] [facility] [place]. Pt has been at facility for 2 days now. Staff reports they called EMS because Pt asked staff to because he felt confused and was felt like pins and needles in his arms and legs. Pt alerted on arrival, Pt also has hx of Dementia.

## 2017-11-18 NOTE — ED Provider Notes (Signed)
[facility]-EMERGENCY DEPT Provider Note   CSN: [id number] Arrival date & time: 11/17/17  2318     History   Chief Complaint Chief Complaint  Patient presents with  . Altered Mental Status   Level 5 caveat due to dementia  HPI [name] [name] is a 57 y.o. male.  HPI Patient with history of atrial fibrillation, dementia presents for altered mental status.  He has been in his facility for 2 days, and they report that he is been acting more confused.  He also reported feeling pins-and-needles in his arms and legs.  He denies any complaints at this time.  He reports he feels well.  I called the facility that he lives in, [address].  The supervisor there does not recall why the patient was sent to the ER.  She reports it was hectic earlier in the night, and he was sent in for evaluation.  She reports he has only been there for 2 days.  She did not receive any other report from tonight. Past Medical History:  Diagnosis Date  . Aneurysm (HCC)   . Atrial fibrillation with RVR (HCC) 03/03/2017  . ETOHism (HCC)   . Gait abnormality 05/26/2016  . Hepatitis C   . 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 Hypertension   . Seizure [facility])     Patient Active Problem List   Diagnosis Date Noted  . Cocaine abuse with cocaine-induced mood disorder (HCC) 10/26/2017  . Insomnia 05/07/2017  . Atrial flutter (HCC) 03/03/2017  . Late effect of cerebrovascular accident (CVA) 01/21/2017  . Bipolar disorder (HCC) 01/21/2017  . Migraine headache 01/21/2017  . Atrial flutter with rapid ventricular response (HCC) 01/10/2017  . Syncope 01/10/2017  . Gait abnormality 05/26/2016  . Smoking 01/05/2015  . Chronic pain syndrome 01/05/2015  . Fall   . Paranoia (HCC)   . Dementia with behavioral disturbance   . Encephalopathy, hepatic (HCC) 10/09/2014  . 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 Seizures (HCC) 01/24/2014  . S/P  cerebral aneurysm repair 01/23/2014  . Essential hypertension, benign 10/31/2012  . History of alcohol abuse 10/26/2012  . Chronic liver disease 10/26/2012    Past Surgical History:  Procedure Laterality Date  . A-FLUTTER ABLATION N/A 03/03/2017   Procedure: A-FLUTTER ABLATION;  Surgeon: Duke Salvia, MD;  Location: [facility] INVASIVE CV LAB;  Service: Cardiovascular;  Laterality: N/A;  . ATRIAL FLUTTER ABLATION  03/03/2017  . CEREBRAL ANEURYSM REPAIR     At [facility]  . HARDWARE REMOVAL Left 10/29/2012   Procedure: HARDWARE REMOVAL;  Surgeon: Sheral Apley, MD;  Location: [facility] OR;  Service: Orthopedics;  Laterality: Left;  . I&D EXTREMITY Left 10/26/2012   Procedure: IRRIGATION AND DEBRIDEMENT Left Elbow;  Surgeon: Sheral Apley, MD;  Location: [facility] OR;  Service: Orthopedics;  Laterality: Left;  . I&D EXTREMITY Left 10/29/2012   Procedure: IRRIGATION AND DEBRIDEMENT EXTREMITY, wound vac change, stimulan beads;  Surgeon: Sheral Apley, MD;  Location: [facility] OR;  Service: Orthopedics;  Laterality: Left;  lateral on bean bag  . I&D EXTREMITY Left 11/02/2012   Procedure: IRRIGATION AND DEBRIDEMENT EXTREMITY;  Surgeon: Sheral Apley, MD;  Location: [facility] OR;  Service: Orthopedics;  Laterality: Left;  . INCISION AND DRAINAGE ABSCESS Left 11/02/2012   Procedure: INCISION AND DRAINAGE ABSCESS;  Surgeon: Sheral Apley, MD;  Location: [facility] OR;  Service: Orthopedics;  Laterality: Left;        Home Medications    Prior  to Admission medications   Medication Sig Start Date End Date Taking? Authorizing Provider  Lacosamide (VIMPAT) 150 MG TABS Take 150 mg by mouth 2 (two) times daily.   Yes [provider]  levETIRAcetam (KEPPRA) 750 MG tablet Take 2 tablets (1,500 mg total) by mouth 2 (two) times daily. 06/26/17  Yes Sharen HeckGibbons, Claudia J, PA-C  QUEtiapine (SEROQUEL) 200 MG tablet Take 200 mg by mouth daily.   Yes [provider]  QUEtiapine (SEROQUEL) 50 MG tablet Take 50 mg by mouth  daily.    Yes [provider]  SUMAtriptan (IMITREX) 50 MG tablet Take 1 tablet (50 mg total) by mouth See admin instructions. Give 1 tablet (50 mg) by mouth every 24 hours as needed for migraine headache;  May repeat in 2 hours if headache persists or recurs. Do not exceed 100 mg in 24 hours Patient taking differently: Take 50 mg by mouth every 2 (two) hours as needed. Give 1 tablet (50 mg) by mouth every 24 hours as needed for migraine headache;  May repeat in 2 hours if headache persists or recurs. Do not exceed 100 mg in 24 hours 06/26/17  Yes Liberty HandyGibbons, Claudia J, PA-C  traMADol (ULTRAM) 50 MG tablet Take 1 tablet (50 mg total) by mouth every 6 (six) hours as needed. Patient taking differently: Take 50 mg by mouth every 6 (six) hours as needed for moderate pain.  05/26/17  Yes Sharee HolsterGreen, Deborah S, NP  carbamazepine (TEGRETOL) 200 MG tablet 800mg  PO QD X 1D, then 600mg  PO QD X 1D, then 400mg  QD X 1D, then 200mg  PO QD X 2D Patient not taking: Reported on 10/25/2017 06/26/17   Liberty HandyGibbons, Claudia J, PA-C    Family History Family History  Problem Relation Age of Onset  . Hypertension Mother   . Cancer Mother   . Hypertension Father   . Diabetes Sister   . Cancer Sister   . Cancer Sister     Social History Social History   Tobacco Use  . Smoking status: Current Every Day Smoker    Packs/day: 0.25    Types: Cigarettes  . Smokeless tobacco: Never Used  Substance Use Topics  . Alcohol use: No    Alcohol/week: 0.0 standard drinks    Comment: quit drinking 11/2011 per patient  . Drug use: No     Allergies   Carbamazepine and Depakote [divalproex sodium]   Review of Systems Review of Systems  Unable to perform ROS: Dementia     Physical Exam Updated Vital Signs BP (!) 156/96 (BP Location: Left Arm)   Pulse 97   Temp 97.8 F (36.6 C) (Oral)   Resp 16   Ht 1.93 m (6\' 4" )   Wt 95.3 kg   SpO2 99%   BMI 25.56 kg/m   Physical Exam  CONSTITUTIONAL: Elderly, no acute  distress HEAD: Normocephalic/atraumatic EYES: EOMI ENMT: Mucous membranes moist NECK: supple no meningeal signs CV: S1/S2 noted, no murmurs/rubs/gallops noted LUNGS: Lungs are clear to auscultation bilaterally, no apparent distress ABDOMEN: soft, nontender, no rebound or guarding, bowel sounds noted throughout abdomen GU:no cva tenderness NEURO: Pt is awake/alert moves all extremitiesx4.  No facial droop.  Patient appears mildly confused EXTREMITIES: pulses normal/equal, full ROM SKIN: warm, color normal  ED Treatments / Results  Labs (all labs ordered are listed, but only abnormal results are displayed) Labs Reviewed - No data to display  EKG None  Radiology No results found.  Procedures Procedures  Medications Ordered in ED Medications -  No data to display   Initial Impression / Assessment and Plan / ED Course  I have reviewed the triage vital signs and the nursing notes.      Unclear why patient was sent to the ER.  When I review his notes and recent work-up, he has had multiple CT head recently.  He does have a known history of previous TBI, has dementia.  It appears he is likely at his baseline.  He will be discharged back to [facility]  Final Clinical Impressions(s) / ED Diagnoses   Final diagnoses:  Dementia without behavioral disturbance, unspecified dementia type    ED Discharge Orders    None       Zadie Rhine, MD 11/18/17 [phone]

## 2017-11-18 NOTE — ED Notes (Signed)
Patient sleeping at this time. Will obtain vitals upon arrival of [facility]AR.

## 2017-11-18 NOTE — ED Notes (Signed)
Called [facility] to give report about Pt's return

## 2017-11-18 NOTE — ED Notes (Signed)
Pt transported by [facility] back to [facility]. Vitals stable on discharge

## 2017-11-18 NOTE — ED Notes (Signed)
Patient given a ham sandwich, graham crackers, peanut butter, and Sprite

## 2017-11-18 NOTE — ED Notes (Signed)
[facility] Expected date:  Expected time:  Means of arrival:  Comments[facility] 60[facility]

## 2017-11-18 NOTE — ED Notes (Signed)
Called [facility] to set up transport back to [facility]

## 2017-12-06 ENCOUNTER — Encounter (HOSPITAL_COMMUNITY): Payer: Self-pay

## 2017-12-06 ENCOUNTER — Emergency Department (HOSPITAL_COMMUNITY)
Admission: EM | Admit: 2017-12-06 | Discharge: 2017-12-06 | Disposition: A | Discharge status: Home / Self Care | Payer: Medicaid Other | Attending: Emergency Medicine | Admitting: Emergency Medicine

## 2017-12-06 ENCOUNTER — Emergency Department (HOSPITAL_COMMUNITY): Payer: Medicaid Other

## 2017-12-06 DIAGNOSIS — Z79899 Other long term (current) drug therapy: Secondary | ICD-10-CM | POA: Insufficient documentation

## 2017-12-06 DIAGNOSIS — R0789 Other chest pain: Secondary | ICD-10-CM

## 2017-12-06 DIAGNOSIS — F0391 Unspecified dementia with behavioral disturbance: Secondary | ICD-10-CM | POA: Diagnosis not present

## 2017-12-06 DIAGNOSIS — F17210 Nicotine dependence, cigarettes, uncomplicated: Secondary | ICD-10-CM | POA: Insufficient documentation

## 2017-12-06 DIAGNOSIS — I10 Essential (primary) hypertension: Secondary | ICD-10-CM | POA: Insufficient documentation

## 2017-12-06 LAB — BASIC METABOLIC PANEL
ANION GAP: 10 (ref 5–15)
BUN: 11 mg/dL (ref 6–20)
CHLORIDE: 103 mmol/L (ref 98–111)
CO2: 25 mmol/L (ref 22–32)
Calcium: 9.1 mg/dL (ref 8.9–10.3)
Creatinine, Ser: 0.6 mg/dL — ABNORMAL LOW (ref 0.61–1.24)
GFR calc non Af Amer: >60 mL/min (ref >60)
Glucose, Bld: 124 mg/dL — ABNORMAL HIGH (ref 70–99)
POTASSIUM: 4.2 mmol/L (ref 3.5–5.1)
Sodium: 138 mmol/L (ref 135–145)

## 2017-12-06 LAB — CBC
HEMATOCRIT: 40 % (ref 39.0–52.0)
HEMOGLOBIN: 13.7 g/dL (ref 13.0–17.0)
MCH: 33.7 pg (ref 26.0–34.0)
MCHC: 34.3 g/dL (ref 30.0–36.0)
MCV: 98.5 fL (ref 78.0–100.0)
Platelets: 173 10*3/uL (ref 150–400)
RBC: 4.06 MIL/uL — ABNORMAL LOW (ref 4.22–5.81)
RDW: 12.5 % (ref 11.5–15.5)
WBC: 7.7 10*3/uL (ref 4.0–10.5)

## 2017-12-06 LAB — I-STAT TROPONIN, ED: Troponin i, poc: 0.01 ng/mL (ref 0.00–0.08)

## 2017-12-06 NOTE — ED Provider Notes (Signed)
[facility] EMERGENCY DEPARTMENT Provider Note   CSN: [id number] Arrival date & time: 12/06/17  1837     History   Chief Complaint Chief Complaint  Patient presents with  . Chest Pain    HPI [name] [name] is a 57 y.o. male.  The history is provided by the patient and medical records. No language interpreter was used.  Chest Pain   This is a new problem. The current episode started more than 1 week ago. The problem occurs constantly. The problem has not changed since onset.The pain is present in the lateral region. The pain is moderate. The quality of the pain is described as sharp. The pain does not radiate. Pertinent negatives include no abdominal pain, no back pain, no claudication, no cough, no diaphoresis, no fever, no headaches, no nausea, no palpitations, no shortness of breath, no sputum production, no vomiting and no weakness. He has tried nothing for the symptoms. The treatment provided no relief. Risk factors include male gender.    Past Medical History:  Diagnosis Date  . Aneurysm (HCC)   . Atrial fibrillation with RVR (HCC) 03/03/2017  . ETOHism (HCC)   . Gait abnormality 05/26/2016  . Hepatitis C   . 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 Hypertension   . Seizure [facility])     Patient Active Problem List   Diagnosis Date Noted  . Cocaine abuse with cocaine-induced mood disorder (HCC) 10/26/2017  . Insomnia 05/07/2017  . Atrial flutter (HCC) 03/03/2017  . Late effect of cerebrovascular accident (CVA) 01/21/2017  . Bipolar disorder (HCC) 01/21/2017  . Migraine headache 01/21/2017  . Atrial flutter with rapid ventricular response (HCC) 01/10/2017  . Syncope 01/10/2017  . Gait abnormality 05/26/2016  . Smoking 01/05/2015  . Chronic pain syndrome 01/05/2015  . Fall   . Paranoia (HCC)   . Dementia with behavioral disturbance   . Encephalopathy, hepatic (HCC) 10/09/2014  . Hereditary and idiopathic peripheral  neuropathy 06/03/2014  . Seizures (HCC) 01/24/2014  . 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/23/2014  . Essential hypertension, benign 10/31/2012  . History of alcohol abuse 10/26/2012  . Chronic liver disease 10/26/2012    Past Surgical History:  Procedure Laterality Date  . A-FLUTTER ABLATION N/A 03/03/2017   Procedure: A-FLUTTER ABLATION;  Surgeon: Duke Salvia, MD;  Location: [facility] INVASIVE CV LAB;  Service: Cardiovascular;  Laterality: N/A;  . ATRIAL FLUTTER ABLATION  03/03/2017  . CEREBRAL ANEURYSM REPAIR     At [facility]  . HARDWARE REMOVAL Left 10/29/2012   Procedure: HARDWARE REMOVAL;  Surgeon: Sheral Apley, MD;  Location: [facility] OR;  Service: Orthopedics;  Laterality: Left;  . I&D EXTREMITY Left 10/26/2012   Procedure: IRRIGATION AND DEBRIDEMENT Left Elbow;  Surgeon: Sheral Apley, MD;  Location: [facility] OR;  Service: Orthopedics;  Laterality: Left;  . I&D EXTREMITY Left 10/29/2012   Procedure: IRRIGATION AND DEBRIDEMENT EXTREMITY, wound vac change, stimulan beads;  Surgeon: Sheral Apley, MD;  Location: [facility] OR;  Service: Orthopedics;  Laterality: Left;  lateral on bean bag  . I&D EXTREMITY Left 11/02/2012   Procedure: IRRIGATION AND DEBRIDEMENT EXTREMITY;  Surgeon: Sheral Apley, MD;  Location: [facility] OR;  Service: Orthopedics;  Laterality: Left;  . INCISION AND DRAINAGE ABSCESS Left 11/02/2012   Procedure: INCISION AND DRAINAGE ABSCESS;  Surgeon: Sheral Apley, MD;  Location: [facility] OR;  Service: Orthopedics;  Laterality: Left;        Home Medications    Prior to Admission medications  Medication Sig Start Date End Date Taking? Authorizing Provider  Lacosamide (VIMPAT) 150 MG TABS Take 150 mg by mouth 2 (two) times daily.    [provider]  levETIRAcetam (KEPPRA) 750 MG tablet Take 2 tablets (1,500 mg total) by mouth 2 (two) times daily. 06/26/17   Liberty Handy, PA-C  QUEtiapine (SEROQUEL) 200 MG tablet Take 200 mg by mouth daily.    [provider]    QUEtiapine (SEROQUEL) 50 MG tablet Take 50 mg by mouth daily.     [provider]  SUMAtriptan (IMITREX) 50 MG tablet Take 1 tablet (50 mg total) by mouth See admin instructions. Give 1 tablet (50 mg) by mouth every 24 hours as needed for migraine headache;  May repeat in 2 hours if headache persists or recurs. Do not exceed 100 mg in 24 hours Patient taking differently: Take 50 mg by mouth every 2 (two) hours as needed. Give 1 tablet (50 mg) by mouth every 24 hours as needed for migraine headache;  May repeat in 2 hours if headache persists or recurs. Do not exceed 100 mg in 24 hours 06/26/17   Liberty Handy, PA-C  traMADol (ULTRAM) 50 MG tablet Take 1 tablet (50 mg total) by mouth every 6 (six) hours as needed. Patient taking differently: Take 50 mg by mouth every 6 (six) hours as needed for moderate pain.  05/26/17   Sharee Holster, NP    Family History Family History  Problem Relation Age of Onset  . Hypertension Mother   . Cancer Mother   . Hypertension Father   . Diabetes Sister   . Cancer Sister   . Cancer Sister     Social History Social History   Tobacco Use  . Smoking status: Current Every Day Smoker    Packs/day: 0.25    Types: Cigarettes  . Smokeless tobacco: Never Used  Substance Use Topics  . Alcohol use: No    Alcohol/week: 0.0 standard drinks    Comment: quit drinking 11/2011 per patient  . Drug use: No     Allergies   Carbamazepine and Depakote [divalproex sodium]   Review of Systems Review of Systems  Constitutional: Negative for chills, diaphoresis, fatigue and fever.  HENT: Negative for congestion and rhinorrhea.   Respiratory: Negative for cough, sputum production, chest tightness, shortness of breath, wheezing and stridor.   Cardiovascular: Positive for chest pain. Negative for palpitations, claudication and leg swelling.  Gastrointestinal: Negative for abdominal pain, constipation, nausea and vomiting.  Genitourinary: Negative for flank  pain.  Musculoskeletal: Negative for back pain, neck pain and neck stiffness.  Skin: Negative for rash and wound.  Neurological: Negative for weakness, light-headedness and headaches.  Psychiatric/Behavioral: Negative for agitation and confusion.  All other systems reviewed and are negative.    Physical Exam Updated Vital Signs BP (!) 138/92 (BP Location: Left Arm)   Pulse 93   Temp 98.7 F (37.1 C) (Oral)   Resp 19   SpO2 96%   Physical Exam  Constitutional: He appears well-developed and well-nourished.  Non-toxic appearance. He does not appear ill. No distress.  HENT:  Head: Normocephalic and atraumatic.  Eyes: Pupils are equal, round, and reactive to light. Conjunctivae are normal.  Neck: Neck supple.  Cardiovascular: Normal rate and regular rhythm.  No murmur heard. Pulmonary/Chest: Effort normal and breath sounds normal. No tachypnea. No respiratory distress. He has no wheezes. He has no rhonchi. He has no rales. He exhibits no laceration.    Abdominal:  Soft. There is no tenderness.  Musculoskeletal: He exhibits no edema.  Neurological: He is alert.  Skin: Skin is warm and dry.  Psychiatric: He has a normal mood and affect.  Nursing note and vitals reviewed.    ED Treatments / Results  Labs (all labs ordered are listed, but only abnormal results are displayed) Labs Reviewed  BASIC METABOLIC PANEL - Abnormal; Notable for the following components:      Result Value   Glucose, Bld 124 (*)    Creatinine, Ser 0.60 (*)    All other components within normal limits  CBC - Abnormal; Notable for the following components:   RBC 4.06 (*)    All other components within normal limits  I-STAT TROPONIN, ED    EKG EKG Interpretation  Date/Time:  Wednesday December 06 2017 18:44:25 EDT Ventricular Rate:  93 PR Interval:  150 QRS Duration: 80 QT Interval:  362 QTC Calculation: 450 R Axis:   53 Text Interpretation:  Normal sinus rhythm Normal ECG When compared to  prior, no significant changes seen.  No STEMI Confirmed by Theda Belfastegeler, Chris ([id number]) on 12/06/2017 7:43:44 PM   Radiology Dg Chest 2 View  Result Date: 12/06/2017 CLINICAL DATA:  Chest pain. History of dementia, seizures, hepatitis, atrial fibrillation. EXAM: CHEST - 2 VIEW COMPARISON:  Chest radiograph November 13, 2017 FINDINGS: Cardiac silhouette is mildly enlarged. Mediastinal silhouette is not suspicious. No pleural effusion or focal consolidation. No pneumothorax. Bridging RIGHT rib calcifications. Soft tissue planes are non suspicious. IMPRESSION: No acute cardiopulmonary process. Electronically Signed   By: Awilda Metroourtnay  Bloomer M.D.   On: 12/06/2017 20:23    Procedures Procedures (including critical care time)  Medications Ordered in ED Medications - No data to display   Initial Impression / Assessment and Plan / ED Course  I have reviewed the triage vital signs and the nursing notes.  Pertinent labs & imaging results that were available during my care of the patient were reviewed by me and considered in my medical decision making (see chart for details).     Wilford CornerFennell Terry Kallal is a 57 y.o. male with a past medical history significant for bipolar disorder, atrial flutter, polysubstance abuse, stroke, liver disease, seizures, and paranoia who presents for chest pain.  Patient reports that he has "a stick" in his right chest wall.  He says it has been there for months.  He says that someone told him yesterday that they saw a stick in his chest and he thinks he saw it as well.  He cannot tell me about any injury and says he did not fall or have any splinter.  He denies any cough, congestion, fevers, chills, palpitations, shortness of breath.  He reports the pain is sharp in his right chest and is worse with palpation.  Patient reports that he has had this for "a minute" which she describes as several months.  He denies any new changes.  On exam, patient has tenderness in the right anterior  chest.  Normal breath sounds.  No murmur.  Patient had no crepitance.  Patient had a scar over the area he was suspecting a subcutaneous stick.  Exam otherwise unremarkable and patient is oriented to place and person.  Given the patient's report that he thinks he has a subcu tissue stick in his chest, x-ray will be obtained.  X-ray did not show any evidence of abnormality.  I did not palpate any subcutaneous mass or foreign body.  There was no evidence of skin injury  and your puncture wound seen.  Lab work otherwise reassuring.  Do not feel patient needs further imaging or work-up at this time.  Suspect patient has a delusion of a stick in his chest where he has his previous scar.  Patient instructed to follow-up with his PCP and his other medical team.  Patient understands return precautions and was discharged in good condition.   Final Clinical Impressions(s) / ED Diagnoses   Final diagnoses:  Right-sided chest wall pain    ED Discharge Orders    None     Clinical Impression: 1. Right-sided chest wall pain     Disposition: Discharge  Condition: Good  I have discussed the results, Dx and Tx plan with the pt(& family if present). He/she/they expressed understanding and agree(s) with the plan. Discharge instructions discussed at great length. Strict return precautions discussed and pt &/or family have verbalized understanding of the instructions. No further questions at time of discharge.    Discharge Medication List as of 12/06/2017 10:07 PM      Follow Up: [facility] [address] [place] [address] [phone] Schedule an appointment as soon as possible for a visit    [facility] EMERGENCY DEPARTMENT [address] [id number] mc [place] [address] [phone]       Naika Noto, Canary Brim, MD 12/07/17 [phone]

## 2017-12-06 NOTE — ED Notes (Signed)
Pt given turkey sandwich and diet coke  

## 2017-12-06 NOTE — ED Notes (Signed)
Pt. To XRAY via stretcher. 

## 2017-12-06 NOTE — ED Provider Notes (Signed)
Patient placed in Quick Look pathway, seen and evaluated   Chief Complaint: Chest pain  HPI:  Patient with a history of dementia with behavioral disturbance presents via EMS from his nursing facility for evaluation of "stick in my chest." He has a scar over his chest and points to it stating that there is a stick on his skin. No sob, vomiting or dizziness.  No SI/HI.  ROS: No sob  Physical Exam:   Gen: No distress  Neuro: Awake and Alert  Skin: Warm    Focused Exam: Heart rate regular, rhythm normal. No tenderness over anterior chest wall. There is a scar on the chest wall and patient points to right chest and states there is a stick there. No wound or trauma noted.    Initiation of care has begun. The patient has been counseled on the process, plan, and necessity for staying for the completion/evaluation, and the remainder of the medical screening examination    Lawrence MarseillesShrosbree, Kacie Huxtable J, PA-C 12/06/17 1912    Mesner, Barbara CowerJason, MD 12/06/17 [phone]

## 2017-12-06 NOTE — Discharge Instructions (Signed)
Your images today did not show evidence of a soft tissue foreign body which you were concerned of.  I suspect you are having chest wall pain given the tenderness you experience when I palpated.  Your other laboratory testing was reassuring and we did not see evidence of pneumonia or other significant chest of normalities.  Please follow-up with your primary doctor for further management.  If any symptoms change or worsen, please return to the nearest emergency department.

## 2017-12-06 NOTE — ED Triage Notes (Signed)
Pt presents for evaluation of chest pain starting today. Pt from nursing home ([facility]). States to EMS he has "stick in my chest." pt has dementia. Altered at baseline. Given 324 ASA in route.

## 2017-12-06 NOTE — ED Notes (Signed)
Patient verbalizes understanding of discharge instructions. Opportunity for questioning and answers were provided. Armband removed by staff, pt discharged from ED. Pt leaving with [facility] to [facility].

## 2017-12-08 ENCOUNTER — Emergency Department (HOSPITAL_COMMUNITY): Payer: Medicaid Other

## 2017-12-08 ENCOUNTER — Encounter (HOSPITAL_COMMUNITY): Payer: Self-pay | Admitting: Emergency Medicine

## 2017-12-08 ENCOUNTER — Other Ambulatory Visit: Payer: Self-pay

## 2017-12-08 ENCOUNTER — Emergency Department (HOSPITAL_COMMUNITY)
Admission: EM | Admit: 2017-12-08 | Discharge: 2017-12-09 | Disposition: A | Discharge status: Home / Self Care | Payer: Medicaid Other | Attending: Emergency Medicine | Admitting: Emergency Medicine

## 2017-12-08 DIAGNOSIS — I10 Essential (primary) hypertension: Secondary | ICD-10-CM | POA: Insufficient documentation

## 2017-12-08 DIAGNOSIS — Z79899 Other long term (current) drug therapy: Secondary | ICD-10-CM | POA: Diagnosis not present

## 2017-12-08 DIAGNOSIS — R202 Paresthesia of skin: Secondary | ICD-10-CM

## 2017-12-08 DIAGNOSIS — R200 Anesthesia of skin: Secondary | ICD-10-CM | POA: Diagnosis present

## 2017-12-08 DIAGNOSIS — F17210 Nicotine dependence, cigarettes, uncomplicated: Secondary | ICD-10-CM | POA: Insufficient documentation

## 2017-12-08 DIAGNOSIS — Z7901 Long term (current) use of anticoagulants: Secondary | ICD-10-CM | POA: Insufficient documentation

## 2017-12-08 DIAGNOSIS — F0391 Unspecified dementia with behavioral disturbance: Secondary | ICD-10-CM | POA: Diagnosis not present

## 2017-12-08 LAB — I-STAT CHEM 8, ED
BUN: 12 mg/dL (ref 6–20)
CHLORIDE: 101 mmol/L (ref 98–111)
Calcium, Ion: 1.14 mmol/L — ABNORMAL LOW (ref 1.15–1.40)
Creatinine, Ser: 0.7 mg/dL (ref 0.61–1.24)
GLUCOSE: 103 mg/dL — AB (ref 70–99)
HCT: 41 % (ref 39.0–52.0)
Hemoglobin: 13.9 g/dL (ref 13.0–17.0)
POTASSIUM: 4.1 mmol/L (ref 3.5–5.1)
Sodium: 137 mmol/L (ref 135–145)
TCO2: 24 mmol/L (ref 22–32)

## 2017-12-08 LAB — URINALYSIS, ROUTINE W REFLEX MICROSCOPIC
Bilirubin Urine: NEGATIVE
GLUCOSE, UA: NEGATIVE mg/dL
Hgb urine dipstick: NEGATIVE
Ketones, ur: NEGATIVE mg/dL
LEUKOCYTES UA: NEGATIVE
Nitrite: NEGATIVE
PROTEIN: NEGATIVE mg/dL
Specific Gravity, Urine: 1.008 (ref 1.005–1.030)
pH: 7 (ref 5.0–8.0)

## 2017-12-08 LAB — I-STAT TROPONIN, ED: TROPONIN I, POC: 0 ng/mL (ref 0.00–0.08)

## 2017-12-08 LAB — CBG MONITORING, ED: GLUCOSE-CAPILLARY: 102 mg/dL — AB (ref 70–99)

## 2017-12-08 NOTE — ED Provider Notes (Signed)
[name] [name] - [facility] EMERGENCY DEPARTMENT Provider Note   CSN: [id number] Arrival date & time: 12/08/17  2012     History   Chief Complaint Chief Complaint  Patient presents with  . Numbness    HPI [name] [name] is a 57 y.o. male.  The history is provided by the patient, the EMS personnel and medical records. No language interpreter was used.   [name] [name] is a 57 y.o. male who presents to the Emergency Department complaining of numbness.  Level V caveat due to dementia.  Per patient he is here because he feels paralyzed and cold in his hands.  Per EMS report he presents for chest pain.  Patient denies any chest pain in ED and states his hands feel cold and he is paralyzed and has an aneurysm.  Attempted to contact facility for additional information - no answer.  Past Medical History:  Diagnosis Date  . Aneurysm (HCC)   . Atrial fibrillation with RVR (HCC) 03/03/2017  . ETOHism (HCC)   . Gait abnormality 05/26/2016  . Hepatitis C   . 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 Hypertension   . Seizure [facility])     Patient Active Problem List   Diagnosis Date Noted  . Cocaine abuse with cocaine-induced mood disorder (HCC) 10/26/2017  . Insomnia 05/07/2017  . Atrial flutter (HCC) 03/03/2017  . Late effect of cerebrovascular accident (CVA) 01/21/2017  . Bipolar disorder (HCC) 01/21/2017  . Migraine headache 01/21/2017  . Atrial flutter with rapid ventricular response (HCC) 01/10/2017  . Syncope 01/10/2017  . Gait abnormality 05/26/2016  . Smoking 01/05/2015  . Chronic pain syndrome 01/05/2015  . Fall   . Paranoia (HCC)   . Dementia with behavioral disturbance   . Encephalopathy, hepatic (HCC) 10/09/2014  . 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 Seizures (HCC) 01/24/2014  . 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/23/2014  . Essential hypertension, benign 10/31/2012  . History of alcohol abuse 10/26/2012  .  Chronic liver disease 10/26/2012    Past Surgical History:  Procedure Laterality Date  . A-FLUTTER ABLATION N/A 03/03/2017   Procedure: A-FLUTTER ABLATION;  Surgeon: Duke Salvia, MD;  Location: [facility] INVASIVE CV LAB;  Service: Cardiovascular;  Laterality: N/A;  . ATRIAL FLUTTER ABLATION  03/03/2017  . CEREBRAL ANEURYSM REPAIR     At [facility]  . HARDWARE REMOVAL Left 10/29/2012   Procedure: HARDWARE REMOVAL;  Surgeon: Sheral Apley, MD;  Location: [facility] OR;  Service: Orthopedics;  Laterality: Left;  . I&D EXTREMITY Left 10/26/2012   Procedure: IRRIGATION AND DEBRIDEMENT Left Elbow;  Surgeon: Sheral Apley, MD;  Location: [facility] OR;  Service: Orthopedics;  Laterality: Left;  . I&D EXTREMITY Left 10/29/2012   Procedure: IRRIGATION AND DEBRIDEMENT EXTREMITY, wound vac change, stimulan beads;  Surgeon: Sheral Apley, MD;  Location: [facility] OR;  Service: Orthopedics;  Laterality: Left;  lateral on bean bag  . I&D EXTREMITY Left 11/02/2012   Procedure: IRRIGATION AND DEBRIDEMENT EXTREMITY;  Surgeon: Sheral Apley, MD;  Location: [facility] OR;  Service: Orthopedics;  Laterality: Left;  . INCISION AND DRAINAGE ABSCESS Left 11/02/2012   Procedure: INCISION AND DRAINAGE ABSCESS;  Surgeon: Sheral Apley, MD;  Location: [facility] OR;  Service: Orthopedics;  Laterality: Left;        Home Medications    Prior to Admission medications   Medication Sig Start Date End Date Taking? Authorizing Provider  apixaban (ELIQUIS) 5 MG TABS tablet Take 5 mg by mouth 2 (  two) times daily.    [provider]  cholecalciferol (VITAMIN D) 1000 units tablet Take 1,000 Units by mouth daily.    [provider]  diltiazem (DILACOR XR) 120 MG 24 hr capsule Take 120 mg by mouth daily.    [provider]  Lacosamide (VIMPAT) 150 MG TABS Take 150 mg by mouth 2 (two) times daily.    [provider]  levETIRAcetam (KEPPRA) 750 MG tablet Take 2 tablets (1,500 mg total) by mouth 2 (two) times daily.  06/26/17   Liberty HandyGibbons, Claudia J, PA-C  Melatonin 3 MG TABS Take 3 mg by mouth at bedtime.    [provider]  QUEtiapine (SEROQUEL) 200 MG tablet Take 200 mg by mouth daily.    [provider]  sertraline (ZOLOFT) 100 MG tablet Take 100 mg by mouth daily.    [provider]  SUMAtriptan (IMITREX) 50 MG tablet Take 1 tablet (50 mg total) by mouth See admin instructions. Give 1 tablet (50 mg) by mouth every 24 hours as needed for migraine headache;  May repeat in 2 hours if headache persists or recurs. Do not exceed 100 mg in 24 hours 06/26/17   Liberty HandyGibbons, Claudia J, PA-C  traMADol (ULTRAM) 50 MG tablet Take 1 tablet (50 mg total) by mouth every 6 (six) hours as needed. Patient taking differently: Take 50 mg by mouth every 6 (six) hours as needed for moderate pain.  05/26/17   Sharee HolsterGreen, Deborah S, NP    Family History Family History  Problem Relation Age of Onset  . Hypertension Mother   . Cancer Mother   . Hypertension Father   . Diabetes Sister   . Cancer Sister   . Cancer Sister     Social History Social History   Tobacco Use  . Smoking status: Current Every Day Smoker    Packs/day: 0.25    Types: Cigarettes  . Smokeless tobacco: Never Used  Substance Use Topics  . Alcohol use: No    Alcohol/week: 0.0 standard drinks    Comment: quit drinking 11/2011 per patient  . Drug use: No     Allergies   Carbamazepine and Depakote [divalproex sodium]   Review of Systems Review of Systems  Unable to perform ROS: Dementia     Physical Exam Updated Vital Signs BP 139/85   Pulse 79   Resp 19   Ht 6\' 2"  (1.88 m)   Wt 95 kg   SpO2 99%   BMI 26.89 kg/m   Physical Exam  Constitutional: He appears well-developed and well-nourished.  HENT:  Head: Normocephalic and atraumatic.  Cardiovascular: Normal rate and regular rhythm.  No murmur heard. Pulmonary/Chest: Effort normal and breath sounds normal. No respiratory distress.  Abdominal: Soft. There is no  tenderness. There is no rebound and no guarding.  Musculoskeletal: He exhibits no edema or tenderness.  Neurological: He is alert.  Disoriented to place in time. No asymmetry of facial movements. No pronator drift. Sensation to light touch intact in all four extremities. Five out of five strength in all four extremities. Visual fields are grossly intact.  Skin: Skin is warm and dry.  Psychiatric:  Anxious appearing.  Nursing note and vitals reviewed.    ED Treatments / Results  Labs (all labs ordered are listed, but only abnormal results are displayed) Labs Reviewed  URINALYSIS, ROUTINE W REFLEX MICROSCOPIC  I-STAT CHEM 8, ED  CBG MONITORING, ED    EKG EKG Interpretation  Date/Time:  Friday December 08 2017 20:39:34  EDT Ventricular Rate:  83 PR Interval:    QRS Duration: 84 QT Interval:  393 QTC Calculation: 462 R Axis:   13 Text Interpretation:  Sinus rhythm No significant change since Confirmed by Tilden Fossa [phone]) on 12/08/2017 8:57:03 PM   Radiology No results found.  Procedures Procedures (including critical care time)  Medications Ordered in ED Medications - No data to display   Initial Impression / Assessment and Plan / ED Course  I have reviewed the triage vital signs and the nursing notes.  Pertinent labs & imaging results that were available during my care of the patient were reviewed by me and considered in my medical decision making (see chart for details).     Patient with history of dementia, atrial fibrillation on anticoagulation here for evaluation of numbness per patient, chest pain per EMS report. Patient denies any pain in the emergency department. EKG without acute ischemic changes. Current presentation is not consistent with ACS, PE, dissection, CVA. Attempted to get an additional information from the facility, but no answer when called. Plan to discharge home with outpatient follow-up and return precautions. Final Clinical Impressions(s)  / ED Diagnoses   Final diagnoses:  None    ED Discharge Orders    None       Tilden Fossa, MD 12/09/17 [phone]

## 2017-12-08 NOTE — ED Notes (Signed)
Pt unable to ambulate. Upon standing he was very unsteady on his feet and could not fully stand without assistance.

## 2017-12-08 NOTE — ED Notes (Signed)
Patient transported to CT 
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
## 2017-12-08 NOTE — ED Notes (Signed)
Pt returned to room  

## 2017-12-09 NOTE — ED Notes (Signed)
Spoke with caretaker at [facility] [place] about patient's discharge. Pt verbalizes understanding of d/c instructions. Pt waiting for [facility]. All belongings with patient.

## 2017-12-09 NOTE — ED Notes (Signed)
[facility] called for transport.  

## 2017-12-12 ENCOUNTER — Emergency Department (HOSPITAL_COMMUNITY)
Admission: EM | Admit: 2017-12-12 | Discharge: 2017-12-12 | Disposition: A | Discharge status: Home / Self Care | Payer: Medicaid Other | Attending: Emergency Medicine | Admitting: Emergency Medicine

## 2017-12-12 ENCOUNTER — Encounter (HOSPITAL_COMMUNITY): Payer: Self-pay

## 2017-12-12 ENCOUNTER — Other Ambulatory Visit: Payer: Self-pay

## 2017-12-12 DIAGNOSIS — I10 Essential (primary) hypertension: Secondary | ICD-10-CM | POA: Diagnosis not present

## 2017-12-12 DIAGNOSIS — Z79899 Other long term (current) drug therapy: Secondary | ICD-10-CM | POA: Insufficient documentation

## 2017-12-12 DIAGNOSIS — F17210 Nicotine dependence, cigarettes, uncomplicated: Secondary | ICD-10-CM | POA: Diagnosis not present

## 2017-12-12 DIAGNOSIS — R109 Unspecified abdominal pain: Secondary | ICD-10-CM | POA: Insufficient documentation

## 2017-12-12 DIAGNOSIS — Z7901 Long term (current) use of anticoagulants: Secondary | ICD-10-CM | POA: Insufficient documentation

## 2017-12-12 DIAGNOSIS — F0390 Unspecified dementia without behavioral disturbance: Secondary | ICD-10-CM | POA: Diagnosis not present

## 2017-12-12 DIAGNOSIS — R339 Retention of urine, unspecified: Secondary | ICD-10-CM | POA: Diagnosis not present

## 2017-12-12 LAB — URINALYSIS, ROUTINE W REFLEX MICROSCOPIC
BILIRUBIN URINE: NEGATIVE
Glucose, UA: NEGATIVE mg/dL
HGB URINE DIPSTICK: NEGATIVE
KETONES UR: NEGATIVE mg/dL
Leukocytes, UA: NEGATIVE
Nitrite: NEGATIVE
PROTEIN: NEGATIVE mg/dL
Specific Gravity, Urine: 1.023 (ref 1.005–1.030)
pH: 6 (ref 5.0–8.0)

## 2017-12-12 NOTE — ED Notes (Signed)
Urine culture sent to lab with UA sample 

## 2017-12-12 NOTE — Discharge Instructions (Addendum)
We saw you in the ER for abdominal discomfort and urinary issues. All the results in the ER are normal -including the results of your urine and bladder scan.  We do not see any evidence of urinary retention or infection. We are not sure what is causing your symptoms. The workup in the ER is not complete, and is limited to screening for life threatening and emergent conditions only, so please see a primary care doctor for further evaluation.

## 2017-12-12 NOTE — ED Notes (Signed)
[facility] Called 

## 2017-12-12 NOTE — ED Notes (Signed)
Bed: [id number] Expected date:  Expected time:  Means of arrival:  Comments: EMS-unable to urinate

## 2017-12-12 NOTE — ED Notes (Signed)
Pt's lighter confiscated and placed at nurse's station.  Pt was agitated during the process.

## 2017-12-12 NOTE — ED Notes (Signed)
This RN Attempted to call [facility] [place]

## 2017-12-12 NOTE — ED Triage Notes (Signed)
Per EMS: Found in bathroom in wheelchair.   Pt c/o of abdominal pain and urinary retention since last night.  Pt hx of CVA.

## 2017-12-12 NOTE — ED Provider Notes (Signed)
[facility]-EMERGENCY DEPT Provider Note   CSN: [id number] Arrival date & time: 12/12/17  1914     History   Chief Complaint Chief Complaint  Patient presents with  . Abdominal Pain  . Urinary Retention    HPI [name] [name] is a 57 y.o. male.  HPI  Patient comes in with chief complaint of abdominal pain.  Level 5 caveat for dementia.  Patient has a history of A. fib, alcoholism hypertension and seizures.  Patient is not able to provide any meaningful history. I have called [facility] [place] at [phone] and spoke with Lurena Joiner. She wasn't there when EMS was called, but reports that pt had complained of not being able to urinate. I also spoke with Cassandra the med tech who reported that patient was having an episode of freaking out, as he was screaming and yelling in the hallway.  These outbursts are not new for the patient and he has a psychiatrist who is managing him.  Past Medical History:  Diagnosis Date  . Aneurysm (HCC)   . Atrial fibrillation with RVR (HCC) 03/03/2017  . ETOHism (HCC)   . Gait abnormality 05/26/2016  . Hepatitis C   . 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 Hypertension   . Seizure [facility])     Patient Active Problem List   Diagnosis Date Noted  . Cocaine abuse with cocaine-induced mood disorder (HCC) 10/26/2017  . Insomnia 05/07/2017  . Atrial flutter (HCC) 03/03/2017  . Late effect of cerebrovascular accident (CVA) 01/21/2017  . Bipolar disorder (HCC) 01/21/2017  . Migraine headache 01/21/2017  . Atrial flutter with rapid ventricular response (HCC) 01/10/2017  . Syncope 01/10/2017  . Gait abnormality 05/26/2016  . Smoking 01/05/2015  . Chronic pain syndrome 01/05/2015  . Fall   . Paranoia (HCC)   . Dementia with behavioral disturbance   . Encephalopathy, hepatic (HCC) 10/09/2014  . 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 Seizures (HCC) 01/24/2014  . S/P  cerebral aneurysm repair 01/23/2014  . Essential hypertension, benign 10/31/2012  . History of alcohol abuse 10/26/2012  . Chronic liver disease 10/26/2012    Past Surgical History:  Procedure Laterality Date  . A-FLUTTER ABLATION N/A 03/03/2017   Procedure: A-FLUTTER ABLATION;  Surgeon: Duke Salvia, MD;  Location: [facility] INVASIVE CV LAB;  Service: Cardiovascular;  Laterality: N/A;  . ATRIAL FLUTTER ABLATION  03/03/2017  . CEREBRAL ANEURYSM REPAIR     At [facility]  . HARDWARE REMOVAL Left 10/29/2012   Procedure: HARDWARE REMOVAL;  Surgeon: Sheral Apley, MD;  Location: [facility] OR;  Service: Orthopedics;  Laterality: Left;  . I&D EXTREMITY Left 10/26/2012   Procedure: IRRIGATION AND DEBRIDEMENT Left Elbow;  Surgeon: Sheral Apley, MD;  Location: [facility] OR;  Service: Orthopedics;  Laterality: Left;  . I&D EXTREMITY Left 10/29/2012   Procedure: IRRIGATION AND DEBRIDEMENT EXTREMITY, wound vac change, stimulan beads;  Surgeon: Sheral Apley, MD;  Location: [facility] OR;  Service: Orthopedics;  Laterality: Left;  lateral on bean bag  . I&D EXTREMITY Left 11/02/2012   Procedure: IRRIGATION AND DEBRIDEMENT EXTREMITY;  Surgeon: Sheral Apley, MD;  Location: [facility] OR;  Service: Orthopedics;  Laterality: Left;  . INCISION AND DRAINAGE ABSCESS Left 11/02/2012   Procedure: INCISION AND DRAINAGE ABSCESS;  Surgeon: Sheral Apley, MD;  Location: [facility] OR;  Service: Orthopedics;  Laterality: Left;        Home Medications    Prior to Admission medications   Medication Sig  Start Date End Date Taking? Authorizing Provider  apixaban (ELIQUIS) 5 MG TABS tablet Take 5 mg by mouth 2 (two) times daily.   Yes [provider]  cholecalciferol (VITAMIN D) 1000 units tablet Take 1,000 Units by mouth daily.   Yes [provider]  diltiazem (DILACOR XR) 120 MG 24 hr capsule Take 120 mg by mouth daily.   Yes [provider]  Lacosamide (VIMPAT) 150 MG TABS Take 150 mg by mouth 2 (two) times  daily.   Yes [provider]  levETIRAcetam (KEPPRA) 750 MG tablet Take 2 tablets (1,500 mg total) by mouth 2 (two) times daily. 06/26/17  Yes Liberty HandyGibbons, Claudia J, PA-C  Melatonin 3 MG TABS Take 3 mg by mouth at bedtime.   Yes [provider]  QUEtiapine (SEROQUEL) 200 MG tablet Take 200 mg by mouth daily.   Yes [provider]  sertraline (ZOLOFT) 100 MG tablet Take 100 mg by mouth daily.   Yes [provider]  SUMAtriptan (IMITREX) 50 MG tablet Take 1 tablet (50 mg total) by mouth See admin instructions. Give 1 tablet (50 mg) by mouth every 24 hours as needed for migraine headache;  May repeat in 2 hours if headache persists or recurs. Do not exceed 100 mg in 24 hours 06/26/17  Yes Liberty HandyGibbons, Claudia J, PA-C  traMADol (ULTRAM) 50 MG tablet Take 1 tablet (50 mg total) by mouth every 6 (six) hours as needed. Patient taking differently: Take 50 mg by mouth every 6 (six) hours as needed for moderate pain.  05/26/17  Yes Sharee HolsterGreen, Deborah S, NP    Family History Family History  Problem Relation Age of Onset  . Hypertension Mother   . Cancer Mother   . Hypertension Father   . Diabetes Sister   . Cancer Sister   . Cancer Sister     Social History Social History   Tobacco Use  . Smoking status: Current Every Day Smoker    Packs/day: 0.25    Types: Cigarettes  . Smokeless tobacco: Never Used  Substance Use Topics  . Alcohol use: No    Alcohol/week: 0.0 standard drinks    Comment: quit drinking 11/2011 per patient  . Drug use: No     Allergies   Carbamazepine and Depakote [divalproex sodium]   Review of Systems Review of Systems  Unable to perform ROS: Dementia     Physical Exam Updated Vital Signs Ht 6\' 2"  (1.88 m)   Wt 92 kg   SpO2 100%   BMI 26.04 kg/m   Physical Exam  Constitutional: He is oriented to person, place, and time. He appears well-developed.  HENT:  Head: Atraumatic.  Neck: Neck supple.  Cardiovascular: Normal rate.    Pulmonary/Chest: Effort normal.  Abdominal: Soft. Normal appearance. He exhibits no distension.  Neurological: He is alert and oriented to person, place, and time.  Skin: Skin is warm.  Nursing note and vitals reviewed.    ED Treatments / Results  Labs (all labs ordered are listed, but only abnormal results are displayed) Labs Reviewed  URINALYSIS, ROUTINE W REFLEX MICROSCOPIC  CBC WITH DIFFERENTIAL/PLATELET  COMPREHENSIVE METABOLIC PANEL  LIPASE, BLOOD    EKG None  Radiology No results found.  Procedures Procedures (including critical care time)  Medications Ordered in ED Medications - No data to display   Initial Impression / Assessment and Plan / ED Course  I have reviewed the triage vital signs and the nursing notes.  Pertinent labs & imaging results that  were available during my care of the patient were reviewed by me and considered in my medical decision making (see chart for details).  Clinical Course as of Dec 12 1099  Tue Dec 12, 2017  1058 Patient reassessed.  He did urinate for [place] and his urine results are normal.  In addition past postvoid residual revealed no urinary retention.  Upon reassessment, patient reports that he feels better.  He still hard to direct, but the repeat abdominal exam did not reveal any change and it continues to be nontender exam.  We will discharge patient at this time.   [AN]    Clinical Course User Index [AN] Derwood Kaplan, MD    Pt comes in with cc of urinary problems and abd pain. Patient is demented and unable to give any meaningful history.  In fact he never complained of urinary discomfort during our entire evaluation.  On exam, patient has soft abdomen without any focal tenderness.  Vital signs are within normal limits.  Final Clinical Impressions(s) / ED Diagnoses   Final diagnoses:  Abdominal pain, unspecified abdominal location  Dementia without behavioral disturbance, unspecified dementia type    ED  Discharge Orders    None       Derwood Kaplan, MD 12/12/17 1101

## 2017-12-14 ENCOUNTER — Encounter (HOSPITAL_COMMUNITY): Payer: Self-pay

## 2017-12-14 ENCOUNTER — Other Ambulatory Visit: Payer: Self-pay

## 2017-12-14 ENCOUNTER — Emergency Department (HOSPITAL_COMMUNITY)
Admission: EM | Admit: 2017-12-14 | Discharge: 2017-12-14 | Disposition: A | Discharge status: Nursing Home (Includes ICF) | Payer: Medicaid Other | Attending: Emergency Medicine | Admitting: Emergency Medicine

## 2017-12-14 DIAGNOSIS — F319 Bipolar disorder, unspecified: Secondary | ICD-10-CM | POA: Insufficient documentation

## 2017-12-14 DIAGNOSIS — R202 Paresthesia of skin: Secondary | ICD-10-CM | POA: Diagnosis present

## 2017-12-14 DIAGNOSIS — F17210 Nicotine dependence, cigarettes, uncomplicated: Secondary | ICD-10-CM | POA: Insufficient documentation

## 2017-12-14 DIAGNOSIS — R52 Pain, unspecified: Secondary | ICD-10-CM | POA: Diagnosis not present

## 2017-12-14 DIAGNOSIS — R451 Restlessness and agitation: Secondary | ICD-10-CM | POA: Insufficient documentation

## 2017-12-14 DIAGNOSIS — Z79899 Other long term (current) drug therapy: Secondary | ICD-10-CM | POA: Insufficient documentation

## 2017-12-14 DIAGNOSIS — I10 Essential (primary) hypertension: Secondary | ICD-10-CM | POA: Insufficient documentation

## 2017-12-14 MED ORDER — STERILE WATER FOR INJECTION IJ SOLN
1.0000 mL | Freq: Once | INTRAMUSCULAR | Status: AC
  Administered 2017-12-14: 1 mL via INTRAMUSCULAR

## 2017-12-14 MED ORDER — STERILE WATER FOR INJECTION IJ SOLN
INTRAMUSCULAR | Status: AC
  Administered 2017-12-14: 04:00:00
  Filled 2017-12-14: qty 10

## 2017-12-14 MED ORDER — ZIPRASIDONE MESYLATE 20 MG IM SOLR
10.0000 mg | Freq: Once | INTRAMUSCULAR | Status: AC
  Administered 2017-12-14: 10 mg via INTRAMUSCULAR
  Filled 2017-12-14: qty 20

## 2017-12-14 NOTE — ED Provider Notes (Signed)
[facility]-EMERGENCY DEPT Provider Note   CSN: [id number] Arrival date & time: 12/14/17  0229     History   Chief Complaint Chief Complaint  Patient presents with  . Tingling    bilateral hands    HPI [name] [name] is a 57 y.o. male.  Patient is a 57 year old male with past medical history of bipolar disorder, polysubstance abuse, chronic liver disease, and behavioral disturbance.  He presents for evaluation of multiple complaints.  Patient states that his entire body hurts, his hands tingle, and he is having urinary retention.  He denies any back pain, injury, or trauma.  History is somewhat difficult as this patient is very aggressive and confrontational.  The history is provided by the patient.    Past Medical History:  Diagnosis Date  . Aneurysm (HCC)   . Atrial fibrillation with RVR (HCC) 03/03/2017  . ETOHism (HCC)   . Gait abnormality 05/26/2016  . Hepatitis C   . 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 Hypertension   . Seizure [facility])     Patient Active Problem List   Diagnosis Date Noted  . Cocaine abuse with cocaine-induced mood disorder (HCC) 10/26/2017  . Insomnia 05/07/2017  . Atrial flutter (HCC) 03/03/2017  . Late effect of cerebrovascular accident (CVA) 01/21/2017  . Bipolar disorder (HCC) 01/21/2017  . Migraine headache 01/21/2017  . Atrial flutter with rapid ventricular response (HCC) 01/10/2017  . Syncope 01/10/2017  . Gait abnormality 05/26/2016  . Smoking 01/05/2015  . Chronic pain syndrome 01/05/2015  . Fall   . Paranoia (HCC)   . Dementia with behavioral disturbance   . Encephalopathy, hepatic (HCC) 10/09/2014  . 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 Seizures (HCC) 01/24/2014  . 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/23/2014  . Essential hypertension, benign 10/31/2012  . History of alcohol abuse 10/26/2012  . Chronic liver disease 10/26/2012    Past Surgical History:    Procedure Laterality Date  . A-FLUTTER ABLATION N/A 03/03/2017   Procedure: A-FLUTTER ABLATION;  Surgeon: Duke Salvia, MD;  Location[facility]VAS[facility] CV LAB;  Service: Cardiovascular;  Laterality: N/A;  . ATRIAL FLUTTER ABLATION  03/03/2017  . CEREBRAL ANEURYSM REPAIR     At [facility]  . HARDWARE REMOVAL Left 10/29/2012   Procedure: HARDWARE REMOVAL;  Surgeon: Sheral Apley, MD;  Location: [facility] OR;  Service: Orthopedics;  Laterality: Left;  . I&D EXTREMITY Left 10/26/2012   Procedure: IRRIGATION AND DEBRIDEMENT Left Elbow;  Surgeon: Sheral Apley, MD;  Location: [facility] OR;  Service: Orthopedics;  Laterality: Left;  . I&D EXTREMITY Left 10/29/2012   Procedure: IRRIGATION AND DEBRIDEMENT EXTREMITY, wound vac change, stimulan beads;  Surgeon: Sheral Apley, MD;  Location: [facility] OR;  Service: Orthopedics;  Laterality: Left;  lateral on bean bag  . I&D EXTREMITY Left 11/02/2012   Procedure: IRRIGATION AND DEBRIDEMENT EXTREMITY;  Surgeon: Sheral Apley, MD;  Location: [facility] OR;  Service: Orthopedics;  Laterality: Left;  . INCISION AND DRAINAGE ABSCESS Left 11/02/2012   Procedure: INCISION AND DRAINAGE ABSCESS;  Surgeon: Sheral Apley, MD;  Location: [facility] OR;  Service: Orthopedics;  Laterality: Left;        Home Medications    Prior to Admission medications   Medication Sig Start Date End Date Taking? Authorizing Provider  apixaban (ELIQUIS) 5 MG TABS tablet Take 5 mg by mouth 2 (two) times daily.   Yes [provider]  cholecalciferol (VITAMIN D) 1000 units tablet Take 1,000 Units  by mouth daily.   Yes [provider]  diltiazem (DILACOR XR) 120 MG 24 hr capsule Take 120 mg by mouth daily.   Yes [provider]  Lacosamide (VIMPAT) 150 MG TABS Take 150 mg by mouth 2 (two) times daily.   Yes [provider]  levETIRAcetam (KEPPRA) 750 MG tablet Take 2 tablets (1,500 mg total) by mouth 2 (two) times daily. 06/26/17  Yes Liberty Handy, PA-C  Melatonin 3  MG TABS Take 3 mg by mouth at bedtime.   Yes [provider]  QUEtiapine (SEROQUEL) 200 MG tablet Take 200 mg by mouth daily.   Yes [provider]  sertraline (ZOLOFT) 100 MG tablet Take 100 mg by mouth daily.   Yes [provider]  SUMAtriptan (IMITREX) 50 MG tablet Take 1 tablet (50 mg total) by mouth See admin instructions. Give 1 tablet (50 mg) by mouth every 24 hours as needed for migraine headache;  May repeat in 2 hours if headache persists or recurs. Do not exceed 100 mg in 24 hours 06/26/17  Yes Liberty Handy, PA-C  traMADol (ULTRAM) 50 MG tablet Take 1 tablet (50 mg total) by mouth every 6 (six) hours as needed. Patient taking differently: Take 50 mg by mouth every 6 (six) hours as needed for moderate pain.  05/26/17  Yes Sharee Holster, NP    Family History Family History  Problem Relation Age of Onset  . Hypertension Mother   . Cancer Mother   . Hypertension Father   . Diabetes Sister   . Cancer Sister   . Cancer Sister     Social History Social History   Tobacco Use  . Smoking status: Current Every Day Smoker    Packs/day: 0.25    Types: Cigarettes  . Smokeless tobacco: Never Used  Substance Use Topics  . Alcohol use: No    Alcohol/week: 0.0 standard drinks    Comment: quit drinking 11/2011 per patient  . Drug use: No     Allergies   Carbamazepine and Depakote [divalproex sodium]   Review of Systems Review of Systems  Unable to perform ROS: Psychiatric disorder     Physical Exam Updated Vital Signs BP (!) 166/87   Pulse 86   Temp (!) 97.4 F (36.3 C) (Oral)   Resp 16   SpO2 97%   Physical Exam  Constitutional: He is oriented to person, place, and time. He appears well-developed and well-nourished. No distress.  HENT:  Head: Normocephalic and atraumatic.  Mouth/Throat: Oropharynx is clear and moist.  Neck: Normal range of motion. Neck supple.  Cardiovascular: Normal rate and regular rhythm. Exam reveals no friction  rub.  No murmur heard. Pulmonary/Chest: Effort normal and breath sounds normal. No respiratory distress. He has no wheezes. He has no rales.  Abdominal: Soft. Bowel sounds are normal. He exhibits no distension. There is no tenderness.  Musculoskeletal: Normal range of motion. He exhibits no edema.  Neurological: He is alert and oriented to person, place, and time. Coordination normal.  Skin: Skin is warm and dry. He is not diaphoretic.  Nursing note and vitals reviewed.    ED Treatments / Results  Labs (all labs ordered are listed, but only abnormal results are displayed) Labs Reviewed - No data to display  EKG None  Radiology No results found.  Procedures Procedures (including critical care time)  Medications Ordered in ED Medications  ziprasidone (GEODON) injection 10 mg (10 mg Intramuscular Given 12/14/17 0401)  sterile water (preservative  free) injection 1 mL (1 mL Injection Given 12/14/17 0401)  sterile water (preservative free) injection (  Given 12/14/17 0401)     Initial Impression / Assessment and Plan / ED Course  I have reviewed the triage vital signs and the nursing notes.  Pertinent labs & imaging results that were available during my care of the patient were reviewed by me and considered in my medical decision making (see chart for details).  Patient able to void with little residual.  He also has full use of his arms and legs.  Vitals are stable and he has had laboratory studies on 2 separate occasions in the past 5 days.  I see no indication to repeat these laboratory studies.  While in the ED, this patient became extremely agitated with screaming and yelling.  When I returned to the room to discuss, he began swearing at me and made belittling remarks.  He ultimately required Geodon.   I see no indication for further work-up and feel as though he is appropriate for discharge.  There are several nurses in the ER this evening who are very familiar with this  patient.  They state that he is at his behavioral baseline.  Final Clinical Impressions(s) / ED Diagnoses   Final diagnoses:  None    ED Discharge Orders    None       Geoffery Lyons, MD 12/14/17 [phone]

## 2017-12-14 NOTE — Discharge Instructions (Addendum)
Continue medications as previously prescribed. ° °Follow-up with your primary doctor in the next week. °

## 2017-12-14 NOTE — ED Triage Notes (Signed)
Pt arrives from [facility]. Pt reports bilateral hand tingling. Pt has dementia and known behavioral tendencies. Hx ETOHism, CVA, MDD, afib, HTN, and seizure. Not ambulatory.

## 2017-12-14 NOTE — ED Notes (Signed)
Pt yelling constantly once staff leaves the room. Pt demanding staff to be in room at all times.

## 2017-12-14 NOTE — ED Notes (Signed)
ED Provider at bedside. 

## 2017-12-16 ENCOUNTER — Emergency Department (HOSPITAL_COMMUNITY)
Admission: EM | Admit: 2017-12-16 | Discharge: 2017-12-16 | Disposition: A | Discharge status: Home / Self Care | Payer: Medicaid Other | Attending: Emergency Medicine | Admitting: Emergency Medicine

## 2017-12-16 ENCOUNTER — Other Ambulatory Visit: Payer: Self-pay

## 2017-12-16 ENCOUNTER — Encounter (HOSPITAL_COMMUNITY): Payer: Self-pay

## 2017-12-16 DIAGNOSIS — F1410 Cocaine abuse, uncomplicated: Secondary | ICD-10-CM | POA: Insufficient documentation

## 2017-12-16 DIAGNOSIS — I10 Essential (primary) hypertension: Secondary | ICD-10-CM | POA: Insufficient documentation

## 2017-12-16 DIAGNOSIS — F0390 Unspecified dementia without behavioral disturbance: Secondary | ICD-10-CM | POA: Insufficient documentation

## 2017-12-16 DIAGNOSIS — Z79899 Other long term (current) drug therapy: Secondary | ICD-10-CM | POA: Insufficient documentation

## 2017-12-16 DIAGNOSIS — F1020 Alcohol dependence, uncomplicated: Secondary | ICD-10-CM | POA: Diagnosis not present

## 2017-12-16 DIAGNOSIS — Z7901 Long term (current) use of anticoagulants: Secondary | ICD-10-CM | POA: Insufficient documentation

## 2017-12-16 DIAGNOSIS — F17210 Nicotine dependence, cigarettes, uncomplicated: Secondary | ICD-10-CM | POA: Diagnosis not present

## 2017-12-16 DIAGNOSIS — F319 Bipolar disorder, unspecified: Secondary | ICD-10-CM | POA: Diagnosis not present

## 2017-12-16 DIAGNOSIS — R202 Paresthesia of skin: Secondary | ICD-10-CM | POA: Diagnosis present

## 2017-12-16 DIAGNOSIS — M79606 Pain in leg, unspecified: Secondary | ICD-10-CM | POA: Insufficient documentation

## 2017-12-16 NOTE — ED Notes (Signed)
Bed: [facility] Expected date:  Expected time:  Means of arrival:  Comments: 57 y/o Male from Nursing Home w/ psych issues

## 2017-12-16 NOTE — Discharge Instructions (Signed)
No abnormalities noted on exam.  Patient does not have any complaints in the ER. No work-up completed in the ER because of nonspecific complaints without any objective evidence of numbness or weakness.

## 2017-12-16 NOTE — ED Triage Notes (Signed)
EMS reports from [facility] [place], facility called because of reported bilateral hand numbness. Pt does not complain of pain or numbness at arrival or per EMS during transport. Pt not answering questions appropriately, changing answers and refusing to answer others. Possible new onset dementia.  BP 110/70 HR 92 RR 20 Sp02 99 RA

## 2017-12-16 NOTE — ED Provider Notes (Signed)
[facility]-EMERGENCY DEPT Provider Note   CSN: [id number] Arrival date & time: 12/16/17  1618     History   Chief Complaint Chief Complaint  Patient presents with  . Medical Clearance    HPI Other Atienza is a 57 y.o. male.  HPI 57 year old male comes in with chief complaint of pain in his leg. Patient informed me that he is having pain in his leg, described as wrapping type pain.  Patient resides at a facility, and EMS was called when patient had bilateral upper extremity numbness.  Patient denies any numbness at this time.   Past Medical History:  Diagnosis Date  . Aneurysm (HCC)   . Atrial fibrillation with RVR (HCC) 03/03/2017  . ETOHism (HCC)   . Gait abnormality 05/26/2016  . Hepatitis C   . 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 Hypertension   . Seizure [facility])     Patient Active Problem List   Diagnosis Date Noted  . Cocaine abuse with cocaine-induced mood disorder (HCC) 10/26/2017  . Insomnia 05/07/2017  . Atrial flutter (HCC) 03/03/2017  . Late effect of cerebrovascular accident (CVA) 01/21/2017  . Bipolar disorder (HCC) 01/21/2017  . Migraine headache 01/21/2017  . Atrial flutter with rapid ventricular response (HCC) 01/10/2017  . Syncope 01/10/2017  . Gait abnormality 05/26/2016  . Smoking 01/05/2015  . Chronic pain syndrome 01/05/2015  . Fall   . Paranoia (HCC)   . Dementia with behavioral disturbance   . Encephalopathy, hepatic (HCC) 10/09/2014  . 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 Seizures (HCC) 01/24/2014  . 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/23/2014  . Essential hypertension, benign 10/31/2012  . History of alcohol abuse 10/26/2012  . Chronic liver disease 10/26/2012    Past Surgical History:  Procedure Laterality Date  . A-FLUTTER ABLATION N/A 03/03/2017   Procedure: A-FLUTTER ABLATION;  Surgeon: Duke Salvia, MD;  Location: [facility] INVASIVE CV LAB;  Service:  Cardiovascular;  Laterality: N/A;  . ATRIAL FLUTTER ABLATION  03/03/2017  . CEREBRAL ANEURYSM REPAIR     At [facility]  . HARDWARE REMOVAL Left 10/29/2012   Procedure: HARDWARE REMOVAL;  Surgeon: Sheral Apley, MD;  Location: [facility] OR;  Service: Orthopedics;  Laterality: Left;  . I&D EXTREMITY Left 10/26/2012   Procedure: IRRIGATION AND DEBRIDEMENT Left Elbow;  Surgeon: Sheral Apley, MD;  Location: [facility] OR;  Service: Orthopedics;  Laterality: Left;  . I&D EXTREMITY Left 10/29/2012   Procedure: IRRIGATION AND DEBRIDEMENT EXTREMITY, wound vac change, stimulan beads;  Surgeon: Sheral Apley, MD;  Location: [facility] OR;  Service: Orthopedics;  Laterality: Left;  lateral on bean bag  . I&D EXTREMITY Left 11/02/2012   Procedure: IRRIGATION AND DEBRIDEMENT EXTREMITY;  Surgeon: Sheral Apley, MD;  Location: [facility] OR;  Service: Orthopedics;  Laterality: Left;  . INCISION AND DRAINAGE ABSCESS Left 11/02/2012   Procedure: INCISION AND DRAINAGE ABSCESS;  Surgeon: Sheral Apley, MD;  Location: [facility] OR;  Service: Orthopedics;  Laterality: Left;        Home Medications    Prior to Admission medications   Medication Sig Start Date End Date Taking? Authorizing Provider  apixaban (ELIQUIS) 5 MG TABS tablet Take 5 mg by mouth 2 (two) times daily.   Yes [provider]  cholecalciferol (VITAMIN D) 1000 units tablet Take 1,000 Units by mouth daily.   Yes [provider]  diltiazem (DILACOR XR) 120 MG 24 hr capsule Take 120 mg by mouth daily.   Yes  [provider]  Lacosamide (VIMPAT) 150 MG TABS Take 150 mg by mouth 2 (two) times daily.   Yes [provider]  levETIRAcetam (KEPPRA) 750 MG tablet Take 2 tablets (1,500 mg total) by mouth 2 (two) times daily. 06/26/17  Yes Liberty Handy, PA-C  Melatonin 3 MG TABS Take 3 mg by mouth at bedtime.   Yes [provider]  QUEtiapine (SEROQUEL) 200 MG tablet Take 200 mg by mouth daily.   Yes [provider]    sertraline (ZOLOFT) 100 MG tablet Take 100 mg by mouth daily.   Yes [provider]  SUMAtriptan (IMITREX) 50 MG tablet Take 1 tablet (50 mg total) by mouth See admin instructions. Give 1 tablet (50 mg) by mouth every 24 hours as needed for migraine headache;  May repeat in 2 hours if headache persists or recurs. Do not exceed 100 mg in 24 hours 06/26/17  Yes Liberty Handy, PA-C  traMADol (ULTRAM) 50 MG tablet Take 1 tablet (50 mg total) by mouth every 6 (six) hours as needed. Patient taking differently: Take 50 mg by mouth every 6 (six) hours as needed for moderate pain.  05/26/17  Yes Sharee Holster, NP    Family History Family History  Problem Relation Age of Onset  . Hypertension Mother   . Cancer Mother   . Hypertension Father   . Diabetes Sister   . Cancer Sister   . Cancer Sister     Social History Social History   Tobacco Use  . Smoking status: Current Every Day Smoker    Packs/day: 0.25    Types: Cigarettes  . Smokeless tobacco: Never Used  Substance Use Topics  . Alcohol use: No    Alcohol/week: 0.0 standard drinks    Comment: quit drinking 11/2011 per patient  . Drug use: No     Allergies   Carbamazepine and Depakote [divalproex sodium]   Review of Systems Review of Systems  Respiratory: Negative for shortness of breath.   Cardiovascular: Negative for chest pain.  Musculoskeletal: Positive for myalgias.  Neurological: Negative for numbness and headaches.     Physical Exam Updated Vital Signs BP 102/74 (BP Location: Left Arm)   Pulse 88   Temp 98 F (36.7 C) (Oral)   Resp 18   SpO2 97%   Physical Exam  Constitutional: He appears well-developed.  HENT:  Head: Atraumatic.  Neck: Neck supple.  Cardiovascular: Normal rate.  Pulmonary/Chest: Effort normal.  Musculoskeletal:  Moving all 4 extremities without difficulty.  No structural deformity appreciated.  Gross sensory exam of the upper extremities normal  Neurological: He is alert.   Skin: Skin is warm.  Nursing note and vitals reviewed.    ED Treatments / Results  Labs (all labs ordered are listed, but only abnormal results are displayed) Labs Reviewed - No data to display  EKG None  Radiology No results found.  Procedures Procedures (including critical care time)  Medications Ordered in ED Medications - No data to display   Initial Impression / Assessment and Plan / ED Course  I have reviewed the triage vital signs and the nursing notes.  Pertinent labs & imaging results that were available during my care of the patient were reviewed by me and considered in my medical decision making (see chart for details).     57 year old male comes in with chief complaint of numbness in his hands.  Patient initially had complaint of bilateral hand numbness, which led to EMS being called  by the facility.  Over here patient has not complained of numbness to his hand, and EMS reported that patient never complained of hand numbness to them either.  Patient is mentioning that he is having pain in his leg, however lower extremity exam does not reveal any significant edema or rash.  Neurologic exam reveals no acute findings. We will discharge patient.  It appears that there is underlying dementia or psychiatric condition that might be contributing to poor communication - but again, no objective neuro deficits appreciated.  Final Clinical Impressions(s) / ED Diagnoses   Final diagnoses:  Pain of lower extremity, unspecified laterality    ED Discharge Orders    None       Derwood Kaplan, MD 12/16/17 1729

## 2017-12-21 ENCOUNTER — Encounter (HOSPITAL_COMMUNITY): Payer: Self-pay

## 2017-12-21 ENCOUNTER — Emergency Department (HOSPITAL_COMMUNITY)
Admission: EM | Admit: 2017-12-21 | Discharge: 2017-12-21 | Disposition: A | Discharge status: Home / Self Care | Payer: Medicaid Other | Attending: Emergency Medicine | Admitting: Emergency Medicine

## 2017-12-21 ENCOUNTER — Other Ambulatory Visit: Payer: Self-pay

## 2017-12-21 DIAGNOSIS — F17210 Nicotine dependence, cigarettes, uncomplicated: Secondary | ICD-10-CM | POA: Insufficient documentation

## 2017-12-21 DIAGNOSIS — L97509 Non-pressure chronic ulcer of other part of unspecified foot with unspecified severity: Secondary | ICD-10-CM | POA: Diagnosis not present

## 2017-12-21 DIAGNOSIS — M79676 Pain in unspecified toe(s): Secondary | ICD-10-CM | POA: Diagnosis present

## 2017-12-21 DIAGNOSIS — I10 Essential (primary) hypertension: Secondary | ICD-10-CM | POA: Insufficient documentation

## 2017-12-21 DIAGNOSIS — L97501 Non-pressure chronic ulcer of other part of unspecified foot limited to breakdown of skin: Secondary | ICD-10-CM

## 2017-12-21 NOTE — ED Triage Notes (Signed)
Per EMS: Wounds on top of both big toes.  Non febrile, no other complaints.  Facility and patient unable to advise how long they"ve been there.

## 2017-12-21 NOTE — Discharge Instructions (Signed)
Follow up with a podiatrist to have your feet looked at.  You may need a special orthotic for your foot.

## 2017-12-21 NOTE — ED Notes (Signed)
Pt brought to room 26 due to disruptive behavior while waiting for [facility] transport back to [facility].  Pt denies any pain except in toe but sts its manageable.  Pt offered snack and drink.  Pt is loud but not aggrivated.  . Pt continues to ask when transport will arrive.

## 2017-12-21 NOTE — ED Notes (Signed)
Bed: [facility] Expected date:  Expected time:  Means of arrival:  Comments: 

## 2017-12-21 NOTE — ED Provider Notes (Signed)
[facility]-EMERGENCY DEPT Provider Note   CSN: [id number] Arrival date & time: 12/21/17  1717     History   Chief Complaint Chief Complaint  Patient presents with  . Toe Pain    HPI Tex Conroy is a 57 y.o. male.  57 yo M with a chief complaint of toe pain.  This is at least was reported by the nursing staff.  Patient lives in a skilled nursing facility.  Patient has no such complaint.  When I told him that he was here for toe pain he said I do not know the EMS people just came and picked me up and took me here.  When asked him if anything was bothering him he said yeah there are some things.  He further talked about feeling like there is a piece of wood stuck in his chest.  When I asked him to describe this further until we could actually see it there.  He denies any other current issues.  The history is provided by the patient.  Toe Pain  This is a chronic problem. The current episode started more than 2 days ago. The problem occurs constantly. The problem has not changed since onset.Pertinent negatives include no chest pain, no abdominal pain, no headaches and no shortness of breath. Nothing aggravates the symptoms. Nothing relieves the symptoms. He has tried nothing for the symptoms. The treatment provided no relief.    Past Medical History:  Diagnosis Date  . Aneurysm (HCC)   . Atrial fibrillation with RVR (HCC) 03/03/2017  . ETOHism (HCC)   . Gait abnormality 05/26/2016  . Hepatitis C   . 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 Hypertension   . Seizure [facility])     Patient Active Problem List   Diagnosis Date Noted  . Cocaine abuse with cocaine-induced mood disorder (HCC) 10/26/2017  . Insomnia 05/07/2017  . Atrial flutter (HCC) 03/03/2017  . Late effect of cerebrovascular accident (CVA) 01/21/2017  . Bipolar disorder (HCC) 01/21/2017  . Migraine headache 01/21/2017  . Atrial flutter with rapid ventricular response  (HCC) 01/10/2017  . Syncope 01/10/2017  . Gait abnormality 05/26/2016  . Smoking 01/05/2015  . Chronic pain syndrome 01/05/2015  . Fall   . Paranoia (HCC)   . Dementia with behavioral disturbance (HCC)   . Encephalopathy, hepatic (HCC) 10/09/2014  . 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 Seizures (HCC) 01/24/2014  . 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/23/2014  . Essential hypertension, benign 10/31/2012  . History of alcohol abuse 10/26/2012  . Chronic liver disease 10/26/2012    Past Surgical History:  Procedure Laterality Date  . A-FLUTTER ABLATION N/A 03/03/2017   Procedure: A-FLUTTER ABLATION;  Surgeon: Duke Salvia, MD;  Location: [facility] INVASIVE CV LAB;  Service: Cardiovascular;  Laterality: N/A;  . ATRIAL FLUTTER ABLATION  03/03/2017  . CEREBRAL ANEURYSM REPAIR     At [facility]  . HARDWARE REMOVAL Left 10/29/2012   Procedure: HARDWARE REMOVAL;  Surgeon: Sheral Apley, MD;  Location: [facility] OR;  Service: Orthopedics;  Laterality: Left;  . I&D EXTREMITY Left 10/26/2012   Procedure: IRRIGATION AND DEBRIDEMENT Left Elbow;  Surgeon: Sheral Apley, MD;  Location: [facility] OR;  Service: Orthopedics;  Laterality: Left;  . I&D EXTREMITY Left 10/29/2012   Procedure: IRRIGATION AND DEBRIDEMENT EXTREMITY, wound vac change, stimulan beads;  Surgeon: Sheral Apley, MD;  Location: [facility] OR;  Service: Orthopedics;  Laterality: Left;  lateral on bean bag  . I&D EXTREMITY  Left 11/02/2012   Procedure: IRRIGATION AND DEBRIDEMENT EXTREMITY;  Surgeon: Sheral Apley, MD;  Location: [facility] OR;  Service: Orthopedics;  Laterality: Left;  . INCISION AND DRAINAGE ABSCESS Left 11/02/2012   Procedure: INCISION AND DRAINAGE ABSCESS;  Surgeon: Sheral Apley, MD;  Location: [facility] OR;  Service: Orthopedics;  Laterality: Left;        Home Medications    Prior to Admission medications   Medication Sig Start Date End Date Taking? Authorizing Provider  apixaban (ELIQUIS) 5 MG TABS tablet Take  5 mg by mouth 2 (two) times daily.    [provider]  cholecalciferol (VITAMIN D) 1000 units tablet Take 1,000 Units by mouth daily.    [provider]  diltiazem (DILACOR XR) 120 MG 24 hr capsule Take 120 mg by mouth daily.    [provider]  Lacosamide (VIMPAT) 150 MG TABS Take 150 mg by mouth 2 (two) times daily.    [provider]  levETIRAcetam (KEPPRA) 750 MG tablet Take 2 tablets (1,500 mg total) by mouth 2 (two) times daily. 06/26/17   Liberty Handy, PA-C  Melatonin 3 MG TABS Take 3 mg by mouth at bedtime.    [provider]  QUEtiapine (SEROQUEL) 200 MG tablet Take 200 mg by mouth daily.    [provider]  sertraline (ZOLOFT) 100 MG tablet Take 100 mg by mouth daily.    [provider]  SUMAtriptan (IMITREX) 50 MG tablet Take 1 tablet (50 mg total) by mouth See admin instructions. Give 1 tablet (50 mg) by mouth every 24 hours as needed for migraine headache;  May repeat in 2 hours if headache persists or recurs. Do not exceed 100 mg in 24 hours 06/26/17   Liberty Handy, PA-C  traMADol (ULTRAM) 50 MG tablet Take 1 tablet (50 mg total) by mouth every 6 (six) hours as needed. Patient taking differently: Take 50 mg by mouth every 6 (six) hours as needed for moderate pain.  05/26/17   Sharee Holster, NP    Family History Family History  Problem Relation Age of Onset  . Hypertension Mother   . Cancer Mother   . Hypertension Father   . Diabetes Sister   . Cancer Sister   . Cancer Sister     Social History Social History   Tobacco Use  . Smoking status: Current Every Day Smoker    Packs/day: 0.25    Types: Cigarettes  . Smokeless tobacco: Never Used  Substance Use Topics  . Alcohol use: No    Alcohol/week: 0.0 standard drinks    Comment: quit drinking 11/2011 per patient  . Drug use: No     Allergies   Carbamazepine and Depakote [divalproex sodium]   Review of Systems Review of Systems    Constitutional: Negative for chills and fever.  HENT: Negative for congestion and facial swelling.   Eyes: Negative for discharge and visual disturbance.  Respiratory: Negative for shortness of breath.   Cardiovascular: Negative for chest pain and palpitations.       Feels that a piece of wood is stuck in his chest   Gastrointestinal: Negative for abdominal pain, diarrhea and vomiting.  Musculoskeletal: Negative for arthralgias and myalgias.  Skin: Negative for color change and rash.  Neurological: Negative for tremors, syncope and headaches.  Psychiatric/Behavioral: Negative for confusion and dysphoric mood.     Physical Exam Updated Vital Signs BP 120/85 (BP Location: Right Arm)   Pulse 83   Temp 98.2  F (36.8 C) (Oral)   Resp 16   Ht 6\' 2"  (1.88 m)   Wt 92 kg   SpO2 99%   BMI 26.04 kg/m   Physical Exam  Constitutional: He is oriented to person, place, and time. He appears well-developed and well-nourished.  HENT:  Head: Normocephalic and atraumatic.  Eyes: Pupils are equal, round, and reactive to light. EOM are normal.  Neck: Normal range of motion. Neck supple. No JVD present.  Cardiovascular: Normal rate and regular rhythm. Exam reveals no gallop and no friction rub.  No murmur heard. There is an old scar to the anterior chest wall but there is no palpable defect.  No foreign body  Pulmonary/Chest: No respiratory distress. He has no wheezes.  Abdominal: He exhibits no distension and no mass. There is no tenderness. There is no rebound and no guarding.  Musculoskeletal: Normal range of motion.  Superficial ulcerations to bilateral great toes.  There is no induration no erythema no warmth.  No drainage.  Feet bilaterally are very dry with cracked skin.  Neurological: He is alert and oriented to person, place, and time.  Skin: No rash noted. No pallor.  Psychiatric: He has a normal mood and affect. His behavior is normal.  Nursing note and vitals reviewed.    ED  Treatments / Results  Labs (all labs ordered are listed, but only abnormal results are displayed) Labs Reviewed - No data to display  EKG None  Radiology No results found.  Procedures Procedures (including critical care time)  Medications Ordered in ED Medications - No data to display   Initial Impression / Assessment and Plan / ED Course  I have reviewed the triage vital signs and the nursing notes.  Pertinent labs & imaging results that were available during my care of the patient were reviewed by me and considered in my medical decision making (see chart for details).     57 yo M here with a concern about his feet.  Patient has no concern about it he has 2 superficial ulcerations to the tips of bilateral great toes.  This is likely secondary to the footwear that he has.  I discussed with him about getting new shoes.  We will have him follow-up with a podiatrist.  6:17 PM:  I have discussed the diagnosis/risks/treatment options with the patient and family and believe the pt to be eligible for discharge home to follow-up with PCP. We also discussed returning to the ED immediately if new or worsening sx occur. We discussed the sx which are most concerning (e.g., sudden worsening pain, fever, inability to tolerate by mouth) that necessitate immediate return. Medications administered to the patient during their visit and any new prescriptions provided to the patient are listed below.  Medications given during this visit Medications - No data to display    The patient appears reasonably screen and/or stabilized for discharge and I doubt any other medical condition or other [facility] requiring further screening, evaluation, or treatment in the ED at this time prior to discharge.    Final Clinical Impressions(s) / ED Diagnoses   Final diagnoses:  Skin ulcer of toe, limited to breakdown of skin, unspecified laterality [facility])    ED Discharge Orders    None       Melene Plan,  DO 12/21/17 1817

## 2017-12-21 NOTE — ED Notes (Signed)
[facility] called for transport.  

## 2017-12-21 NOTE — ED Notes (Signed)
Pt is constantly yelling at everyone who walks by, stating he is missing thing, like cigarettes,wallets, belt, chain, wheelchair. Even though I gave him blanket, sandwich, soda, assisted him to bathroom,  he keeps cussing me out and calling me a white racist. Security at the bedside to ask pt to calm down. [facility] called to check on ETA, they said at least 2 more hours. Charge nurse is aware of situation.

## 2017-12-21 NOTE — ED Notes (Signed)
Bed: WA21 Expected date:  Expected time:  Means of arrival:  Comments: EMS toe infection, dementia from facility rm 21

## 2018-01-01 ENCOUNTER — Ambulatory Visit: Payer: Medicaid Other | Admitting: Podiatry

## 2018-01-17 ENCOUNTER — Encounter: Payer: Medicaid Other | Admitting: Podiatry

## 2018-01-24 ENCOUNTER — Other Ambulatory Visit: Payer: Self-pay

## 2018-01-24 ENCOUNTER — Encounter: Payer: Self-pay | Admitting: Neurology

## 2018-01-24 ENCOUNTER — Ambulatory Visit: Payer: Medicaid Other | Admitting: Neurology

## 2018-01-24 ENCOUNTER — Encounter

## 2018-01-24 VITALS — BP 141/89 | HR 108 | Resp 20 | Ht 74.0 in | Wt 190.0 lb

## 2018-01-24 DIAGNOSIS — R269 Unspecified abnormalities of gait and mobility: Secondary | ICD-10-CM | POA: Diagnosis not present

## 2018-01-24 DIAGNOSIS — Z9889 Other specified postprocedural states: Secondary | ICD-10-CM

## 2018-01-24 DIAGNOSIS — Z8679 Personal history of other diseases of the circulatory system: Secondary | ICD-10-CM

## 2018-01-24 DIAGNOSIS — E538 Deficiency of other specified B group vitamins: Secondary | ICD-10-CM

## 2018-01-24 DIAGNOSIS — R569 Unspecified convulsions: Secondary | ICD-10-CM

## 2018-01-24 DIAGNOSIS — F1414 Cocaine abuse with cocaine-induced mood disorder: Secondary | ICD-10-CM

## 2018-01-24 DIAGNOSIS — F01518 Vascular dementia, unspecified severity, with other behavioral disturbance: Secondary | ICD-10-CM

## 2018-01-24 DIAGNOSIS — F0151 Vascular dementia with behavioral disturbance: Secondary | ICD-10-CM

## 2018-01-24 NOTE — Progress Notes (Signed)
Reason for visit: Seizures, gait disorder, peripheral neuropathy  [name] [name] is a 57 y.o. male  History of present illness:  [name] [name] is a 57 year old right-handed black male with a history of traumatic brain injury with bifrontal encephalomalacia.  The patient has a chronic encephalopathy and a memory disorder.  The patient has a history of a significant gait disorder, he has had a documented peripheral neuropathy.  The patient has atrial fibrillation, he is on anticoagulant therapy.  He has been residing in an assisted living facility over the last 2 months.  He has not had any falls during this period time and he has not had any recurrent seizures.  He was homeless in the summer 2019, he was using cocaine at that time.  The patient has had some progression of his memory, the patient may be going to a memory disorders unit in the near future.  At times, the patient may have some agitation.  This is not a frequent occurrence.  The patient reports some numbness of the extremities, hands and legs.  He denies issues controlling the bowels or the bladder.  He does report some headaches and dizziness.  He claims that he sleeps very well.  He usually uses a cane for ambulation or a wheelchair for mobility.  The patient returns for an evaluation.  Past Medical History:  Diagnosis Date  . Aneurysm (HCC)   . Atrial fibrillation with RVR (HCC) 03/03/2017  . ETOHism (HCC)   . Gait abnormality 05/26/2016  . Hepatitis C   . 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 Hypertension   . Seizure [facility])     Past Surgical History:  Procedure Laterality Date  . A-FLUTTER ABLATION N/A 03/03/2017   Procedure: A-FLUTTER ABLATION;  Surgeon: Duke Salvia, MD;  Location: [facility] INVASIVE CV LAB;  Service: Cardiovascular;  Laterality: N/A;  . ATRIAL FLUTTER ABLATION  03/03/2017  . CEREBRAL ANEURYSM REPAIR     At [facility]  . HARDWARE REMOVAL Left 10/29/2012   Procedure:  HARDWARE REMOVAL;  Surgeon: Sheral Apley, MD;  Location: [facility] OR;  Service: Orthopedics;  Laterality: Left;  . I&D EXTREMITY Left 10/26/2012   Procedure: IRRIGATION AND DEBRIDEMENT Left Elbow;  Surgeon: Sheral Apley, MD;  Location: [facility] OR;  Service: Orthopedics;  Laterality: Left;  . I&D EXTREMITY Left 10/29/2012   Procedure: IRRIGATION AND DEBRIDEMENT EXTREMITY, wound vac change, stimulan beads;  Surgeon: Sheral Apley, MD;  Location: [facility] OR;  Service: Orthopedics;  Laterality: Left;  lateral on bean bag  . I&D EXTREMITY Left 11/02/2012   Procedure: IRRIGATION AND DEBRIDEMENT EXTREMITY;  Surgeon: Sheral Apley, MD;  Location: [facility] OR;  Service: Orthopedics;  Laterality: Left;  . INCISION AND DRAINAGE ABSCESS Left 11/02/2012   Procedure: INCISION AND DRAINAGE ABSCESS;  Surgeon: Sheral Apley, MD;  Location: [facility] OR;  Service: Orthopedics;  Laterality: Left;    Family History  Problem Relation Age of Onset  . Hypertension Mother   . Cancer Mother   . Hypertension Father   . Diabetes Sister   . Cancer Sister   . Cancer Sister     Social history:  reports that he has been smoking cigarettes. He has been smoking about 0.25 packs per day. He has never used smokeless tobacco. He reports that he does not drink alcohol or use drugs.  Medications:  Prior to Admission medications   Medication Sig Start Date End Date Taking? Authorizing Provider  apixaban (ELIQUIS) 5 MG  TABS tablet Take 5 mg by mouth 2 (two) times daily.    [provider]  cholecalciferol (VITAMIN D) 1000 units tablet Take 1,000 Units by mouth daily.    [provider]  diltiazem (DILACOR XR) 120 MG 24 hr capsule Take 120 mg by mouth daily.    [provider]  Lacosamide (VIMPAT) 150 MG TABS Take 150 mg by mouth 2 (two) times daily.    [provider]  levETIRAcetam (KEPPRA) 750 MG tablet Take 2 tablets (1,500 mg total) by mouth 2 (two) times daily. 06/26/17   Liberty Handy, PA-C    Melatonin 3 MG TABS Take 3 mg by mouth at bedtime.    [provider]  QUEtiapine (SEROQUEL) 200 MG tablet Take 200 mg by mouth daily.    [provider]  sertraline (ZOLOFT) 100 MG tablet Take 100 mg by mouth daily.    [provider]  SUMAtriptan (IMITREX) 50 MG tablet Take 1 tablet (50 mg total) by mouth See admin instructions. Give 1 tablet (50 mg) by mouth every 24 hours as needed for migraine headache;  May repeat in 2 hours if headache persists or recurs. Do not exceed 100 mg in 24 hours 06/26/17   Liberty Handy, PA-C  traMADol (ULTRAM) 50 MG tablet Take 1 tablet (50 mg total) by mouth every 6 (six) hours as needed. Patient taking differently: Take 50 mg by mouth every 6 (six) hours as needed for moderate pain.  05/26/17   Sharee Holster, NP      Allergies  Allergen Reactions  . Carbamazepine Other (See Comments)    Hyponatremia  . Depakote [Divalproex Sodium] Other (See Comments)    Unknown - on MAR    ROS:  Out of a complete 14 system review of symptoms, the patient complains only of the following symptoms, and all other reviewed systems are negative.  Memory problems Walking problems Numbness  Blood pressure (!) 141/89, pulse (!) 108, resp. rate 20, height 6\' 2"  (1.88 m), weight 190 lb (86.2 kg).  Physical Exam  General: The patient is alert and cooperative at the time of the examination.  Eyes: Pupils are equal, round, and reactive to light. Discs are flat bilaterally.  Neck: The neck is supple, no carotid bruits are noted.  Respiratory: The respiratory examination is clear.  Cardiovascular: The cardiovascular examination reveals a regular rate and rhythm, no obvious murmurs or rubs are noted.  Skin: Extremities are without significant edema.  Neurologic Exam  Mental status: The patient is alert and oriented x 2 at the time of the examination (not oriented to date). The Mini-Mental status examination done today shows a total score  18/30.  The patient is able to name 9 four-legged animals in 1 minute..  Cranial nerves: Facial symmetry is present. There is good sensation of the face to pinprick on the left, decreased on the right. The strength of the facial muscles and the muscles to head turning and shoulder shrug are normal bilaterally. Speech is well enunciated, no aphasia or dysarthria is noted. Extraocular movements are full. Visual fields are full. The tongue is midline, and the patient has symmetric elevation of the soft palate. No obvious hearing deficits are noted.  Motor: The motor testing reveals 5 over 5 strength of all 4 extremities. Good symmetric motor tone is noted throughout.  Sensory: Sensory testing is notable for some decrease in pinprick sensation on the right arm as compared to the left, decreased below the knees  on both legs, decreased vibration sensation in both feet, symmetric in the arms. No evidence of extinction is noted.  Coordination: Cerebellar testing reveals good finger-nose-finger bilaterally, the patient has some dysmetria with heel-to-shin bilaterally.  Gait and station: Gait is wide-based, ataxic.  Tandem gait was not attempted.  Romberg is negative but is unsteady.  Reflexes: Deep tendon reflexes are symmetric, but are decreased bilaterally. Toes are downgoing bilaterally.   CT head 12/08/17:  IMPRESSION: 1. No CT evidence for acute intracranial abnormality. 2. Similar appearance of bifrontal and anterior left temporal lobe encephalomalacia. 3. Atrophy  * CT scan images were reviewed online. I agree with the written report.    Assessment/Plan:  1.  History of traumatic brain injury  2.  Dementia  3.  Gait disorder  4.  Peripheral neuropathy  5.  History of seizures  The patient will have some blood work done today.  He will remain on Keppra and Vimpat.  An FL-2 form was filled out, memory disorders unit is a reasonable transfer for this patient.  He will follow-up in 6  months.  Marlan Palau MD 01/24/2018 11:07 AM  [facility] [address] [place], [place] [address]  Phone [phone] Fax [phone]

## 2018-01-24 NOTE — Progress Notes (Signed)
This encounter was created in error - please disregard.

## 2018-01-25 ENCOUNTER — Telehealth: Payer: Self-pay | Admitting: *Deleted

## 2018-01-25 LAB — HIV ANTIBODY (ROUTINE TESTING W REFLEX): HIV SCREEN 4TH GENERATION: NONREACTIVE

## 2018-01-25 LAB — RPR: RPR: NONREACTIVE

## 2018-01-25 LAB — VITAMIN B12: VITAMIN B 12: 427 pg/mL (ref 232–1245)

## 2018-01-25 NOTE — Telephone Encounter (Signed)
-----   Message from York Spaniel, MD sent at 01/25/2018  7:22 AM EST -----  The blood work results are unremarkable. Please call the patient.  ----- Message ----- From: [facility] Lab Results In Sent: 01/25/2018   5:39 AM EST To: York Spaniel, MD

## 2018-01-25 NOTE — Telephone Encounter (Signed)
LMOM that lab work done in our office was ok; no concerns noted; no need to return this call unless there are questions/fim

## 2018-03-07 NOTE — Progress Notes (Signed)
Patient Name: [name] [name]  Date of Birth: 03/21/1961  MRN: [id number]  PCP: System, Pcp Not In  Referring Provider:   Patient Active Problem List   Diagnosis Date Noted  . Cocaine abuse with cocaine-induced mood disorder (HCC) 10/26/2017  . S/P ablation of atrial fibrillation 07/03/2017  . Insomnia 05/07/2017  . Atrial flutter (HCC) 03/03/2017  . Late effect of cerebrovascular accident (CVA) 01/21/2017  . Bipolar disorder (HCC) 01/21/2017  . Migraine headache 01/21/2017  . Atrial flutter with rapid ventricular response (HCC) 01/10/2017  . Syncope 01/10/2017  . Gait abnormality 05/26/2016  . Smoking 01/05/2015  . Chronic pain syndrome 01/05/2015  . Non-healing non-surgical wound 12/12/2014  . Fall   . Paranoia (HCC)   . Dementia with behavioral disturbance (HCC)   . Encephalopathy, hepatic (HCC) 10/09/2014  . 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 Seizures (HCC) 01/24/2014  . 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/23/2014  . Fracture of olecranon process of ulna 11/08/2012  . Osteomyelitis (HCC) 11/08/2012  . Essential hypertension, benign 10/31/2012  . History of alcohol abuse 10/26/2012  . Chronic hepatitis C with hepatic coma (HCC) 10/26/2012   CC:  New patient here for evaluation for treatment of Hepatitis C infection.   HPI/ROS:  [name] [name] is a 57 y.o. male with past medical history significant for liver disease, seizures, cocaine use, dementia, bipolar disorder, atrial fibrillation, alcohol abuse and stroke. His cognitive/memory is limited and he is not a reliable historian. Nurse Tech from his facility is present today.   Review of the chart revealed Genotype 1b with Hep C RNA 4,098,119 in 2015. He denies every having received treatment. He tells me that he has in the past used injection drugs, snorted and shared intranasal straws with cocaine abuse and has also received blood transfusions in the past. Has a history of hepatic  encephalopathy on the chart.   Patient does not have documented immunity to Hepatitis A. Patient does not have documented immunity to Hepatitis B.    Constitutional: negative for fevers, fatigue, malaise and anorexia Eyes: negative for icterus Cardiovascular: negative for orthopnea, lower extremity edema Gastrointestinal: negative for diarrhea, abdominal pain and jaundice Hematologic/lymphatic: negative for bleeding, easy bruising  Musculoskeletal: negative for arthralgias, wheelchair bound  Neurological: positive for memory problems All other systems reviewed and are negative      Past Medical History:  Diagnosis Date  . Aneurysm (HCC)   . Atrial fibrillation with RVR (HCC) 03/03/2017  . ETOHism (HCC)   . Gait abnormality 05/26/2016  . Hepatitis C   . 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 Hypertension   . Seizure [facility])     Prior to Admission medications   Medication Sig Start Date End Date Taking? Authorizing Provider  apixaban (ELIQUIS) 5 MG TABS tablet Take 5 mg by mouth 2 (two) times daily.    [provider]  cholecalciferol (VITAMIN D) 1000 units tablet Take 1,000 Units by mouth daily.    [provider]  diltiazem (DILACOR XR) 120 MG 24 hr capsule Take 120 mg by mouth daily.    [provider]  Lacosamide (VIMPAT) 150 MG TABS Take 150 mg by mouth 2 (two) times daily.    [provider]  levETIRAcetam (KEPPRA) 750 MG tablet Take 2 tablets (1,500 mg total) by mouth 2 (two) times daily. 06/26/17   Liberty Handy, PA-C  Melatonin 3 MG TABS Take 3 mg by mouth at bedtime.    [provider]  QUEtiapine (SEROQUEL) 200 MG tablet Take 200 mg by mouth daily.    [provider]  sertraline (ZOLOFT) 100 MG tablet Take 100 mg by mouth daily.    [provider]  SUMAtriptan (IMITREX) 50 MG tablet Take 1 tablet (50 mg total) by mouth See admin instructions. Give 1 tablet (50 mg) by mouth every 24 hours as  needed for migraine headache;  May repeat in 2 hours if headache persists or recurs. Do not exceed 100 mg in 24 hours 06/26/17   Liberty HandyGibbons, Claudia J, PA-C  traMADol (ULTRAM) 50 MG tablet Take 1 tablet (50 mg total) by mouth every 6 (six) hours as needed. Patient taking differently: Take 50 mg by mouth every 6 (six) hours as needed for moderate pain.  05/26/17   Sharee HolsterGreen, Deborah S, NP    Allergies  Allergen Reactions  . Carbamazepine Other (See Comments)    Hyponatremia  . Depakote [Divalproex Sodium] Other (See Comments)    Unknown - on MAR    Social History   Tobacco Use  . Smoking status: Current Every Day Smoker    Packs/day: 0.25    Types: Cigarettes  . Smokeless tobacco: Never Used  Substance Use Topics  . Alcohol use: No    Alcohol/week: 0.0 standard drinks    Comment: quit drinking 11/2011 per patient  . Drug use: No    Family History  Problem Relation Age of Onset  . Hypertension Mother   . Cancer Mother   . Hypertension Father   . Diabetes Sister   . Cancer Sister   . Cancer Sister     Objective:   Vitals:   03/08/18 1123  BP: 92/60  Pulse: 86   Constitutional: in no apparent distress, non-toxic and cooperative Eyes: anicteric Cardiovascular: Cor irreg, irreg RRR and No murmurs Respiratory: clear Gastrointestinal: Bowel sounds are normal, liver is not enlarged, spleen is not enlarged Musculoskeletal: peripheral pulses normal, no pedal edema, no clubbing or cyanosis Skin: negative for - jaundice, spider hemangioma, telangiectasia, palmar erythema, ecchymosis and atrophy; no porphyria cutanea tarda Lymphatic: no cervical lymphadenopathy   Laboratory: Genotype:  Lab Results  Component Value Date   HCVGENOTYPE 1b 03/18/2014   HCV viral load:  Lab Results  Component Value Date   HCVQUANT 1,308,6574,696,324 (H) 03/18/2014   Lab Results  Component Value Date   WBC 7.7 12/06/2017   HGB 13.9 12/08/2017   HCT 41.0 12/08/2017   MCV 98.5 12/06/2017   PLT 173  12/06/2017    Lab Results  Component Value Date   CREATININE 0.70 12/08/2017   BUN 12 12/08/2017   NA 137 12/08/2017   K 4.1 12/08/2017   CL 101 12/08/2017   CO2 25 12/06/2017    Lab Results  Component Value Date   ALT 16 11/13/2017   AST 30 11/13/2017   ALKPHOS 70 11/13/2017    Lab Results  Component Value Date   INR 1.13 11/13/2017   BILITOT 1.3 (H) 11/13/2017   ALBUMIN 3.5 11/13/2017    Imaging:  10/2014 CT scan - enhanced liver imaging normal  Assessment & Plan:   Problem List Items Addressed This Visit      Unprioritized   Chronic hepatitis C with hepatic coma (HCC) - Primary    New Patient with Chronic Hepatitis C genotype 1b, treatment naive.   I discussed with the patient the lab findings that confirm chronic hepatitis C as well as the natural history and progression of disease including about 30% of people  who develop cirrhosis of the liver if left untreated and once cirrhosis is established there is a 2-7% risk per year of liver cancer and liver failure.  I discussed the importance of treatment and benefits in reducing the risk, even if significant liver fibrosis exists. I also discussed risk for re-infection following treatment should he not continue to modify risk factors.    Patient counseled extensively on limiting acetaminophen to no more than 2 grams daily, avoidance of alcohol.  Transmission discussed with patient including sexual transmission, sharing razors and toothbrush.   Will need referral to gastroenterology if concern for cirrhosis  Will prescribe appropriate medication based on genotype and coverage   Hepatitis A and B titers to be drawn today with appropriate vaccinations as needed   Pneumovax vaccine today   Further work up to include liver staging through non-invasive serum analysis with APRI and FIB4 scores and Liver Fibrosis panel; U/S to follow if discordant or concerning results.   Will bring him back in 2 weeks to review results  with our pharmacy team. Mandatory paperwork for Medicaid required. Will need to consider DDI with eliquis and preferred mavyret - will discuss with pharmacy team.         Relevant Orders   Hepatitis B surface antigen   Hepatitis B surface antibody,qualitative   Hepatitis C RNA quantitative   Liver Fibrosis, FibroTest-ActiTest   Protime-INR   CBC   Comprehensive metabolic panel   Hepatitis A Ab, Total     I spent 45 minutes with the patient including greater than 70% of time in face to face counsel of the patient re hepatitis c and the details described above and in coordination of their care.  Rexene Alberts, MSN, NP-C [facility] for Infectious Disease [facility]  [email or web address] Pager: [phone] Office: [phone]

## 2018-03-08 ENCOUNTER — Encounter: Payer: Self-pay | Admitting: Infectious Diseases

## 2018-03-08 ENCOUNTER — Ambulatory Visit (INDEPENDENT_AMBULATORY_CARE_PROVIDER_SITE_OTHER): Payer: Medicaid Other | Admitting: Infectious Diseases

## 2018-03-08 VITALS — BP 92/60 | HR 86

## 2018-03-08 DIAGNOSIS — Z23 Encounter for immunization: Secondary | ICD-10-CM | POA: Diagnosis not present

## 2018-03-08 DIAGNOSIS — B182 Chronic viral hepatitis C: Secondary | ICD-10-CM | POA: Diagnosis present

## 2018-03-08 NOTE — Patient Instructions (Signed)
Nice to meet you today!    We need to get a little more information about your hepatitis c infection before we start your treatment. I anticipate that we can get you started in a few weeks.    Until we can get you treated would recommend: - condoms with sexual encounters or abstinence - no sharing of razors, toothbrushes or anything that could potentially have blood on it.  - limit alcohol to as little as possible to less than 1 drink a day  - limit tylenol use to less than 2,000 mg daily    Please return in 2 weeks with our pharmacy team to go over your results and discuss your medications.

## 2018-03-08 NOTE — Assessment & Plan Note (Signed)
New Patient with Chronic Hepatitis C genotype 1b, treatment naive.   I discussed with the patient the lab findings that confirm chronic hepatitis C as well as the natural history and progression of disease including about 30% of people who develop cirrhosis of the liver if left untreated and once cirrhosis is established there is a 2-7% risk per year of liver cancer and liver failure.  I discussed the importance of treatment and benefits in reducing the risk, even if significant liver fibrosis exists. I also discussed risk for re-infection following treatment should he not continue to modify risk factors.    Patient counseled extensively on limiting acetaminophen to no more than 2 grams daily, avoidance of alcohol.  Transmission discussed with patient including sexual transmission, sharing razors and toothbrush.   Will need referral to gastroenterology if concern for cirrhosis  Will prescribe appropriate medication based on genotype and coverage   Hepatitis A and B titers to be drawn today with appropriate vaccinations as needed   Pneumovax vaccine today   Further work up to include liver staging through non-invasive serum analysis with APRI and FIB4 scores and Liver Fibrosis panel; U/S to follow if discordant or concerning results.   Will bring him back in 2 weeks to review results with our pharmacy team. Mandatory paperwork for Medicaid required. Will need to consider DDI with eliquis and preferred mavyret - will discuss with pharmacy team.

## 2018-03-08 NOTE — Addendum Note (Signed)
Addended by: Gerarda FractionHURTADO, Rayli Wiederhold C on: 03/08/2018 03:29 PM   Modules accepted: Orders

## 2018-03-09 NOTE — Progress Notes (Signed)
APRI 0.310 FIB-4 1.65 >> both methods of non-invasive liver staging indicate that the patient is statistically very low risk for fibrosis. Will await FibroTest for further confirmation. Patient with Medicaid and Mavyret preferred - will await hep c rna, previously identified as 1b from 2015 with current genotype pending. He is on multiple psychiatric medications and apixaban - will review at upcoming appointment to ensure no limiting DDI.

## 2018-03-15 LAB — COMPREHENSIVE METABOLIC PANEL
AG RATIO: 1.4 (calc) (ref 1.0–2.5)
ALKALINE PHOSPHATASE (APISO): 85 U/L (ref 40–115)
ALT: 15 U/L (ref 9–46)
AST: 18 U/L (ref 10–35)
Albumin: 4.2 g/dL (ref 3.6–5.1)
BUN: 17 mg/dL (ref 7–25)
CHLORIDE: 105 mmol/L (ref 98–110)
CO2: 28 mmol/L (ref 20–32)
Calcium: 9.6 mg/dL (ref 8.6–10.3)
Creat: 0.87 mg/dL (ref 0.70–1.33)
GLOBULIN: 3.1 g/dL (ref 1.9–3.7)
Glucose, Bld: 112 mg/dL — ABNORMAL HIGH (ref 65–99)
Potassium: 4.2 mmol/L (ref 3.5–5.3)
Sodium: 141 mmol/L (ref 135–146)
Total Bilirubin: 0.5 mg/dL (ref 0.2–1.2)
Total Protein: 7.3 g/dL (ref 6.1–8.1)

## 2018-03-15 LAB — LIVER FIBROSIS, FIBROTEST-ACTITEST
ALPHA-2-MACROGLOBULIN: 460 mg/dL — AB (ref 106–279)
ALT: 17 U/L (ref 9–46)
APOLIPOPROTEIN A1: 110 mg/dL (ref 94–176)
BILIRUBIN: 0.5 mg/dL (ref 0.2–1.2)
FIBROSIS SCORE: 0.74
GGT: 46 U/L (ref 3–85)
HAPTOGLOBIN: 178 mg/dL (ref 43–212)
NECROINFLAMMAT ACT SCORE: 0.11
Reference ID: 2784511

## 2018-03-15 LAB — CBC
HEMATOCRIT: 46.8 % (ref 38.5–50.0)
Hemoglobin: 16.2 g/dL (ref 13.2–17.1)
MCH: 32.7 pg (ref 27.0–33.0)
MCHC: 34.6 g/dL (ref 32.0–36.0)
MCV: 94.5 fL (ref 80.0–100.0)
MPV: 11 fL (ref 7.5–12.5)
Platelets: 166 10*3/uL (ref 140–400)
RBC: 4.95 10*6/uL (ref 4.20–5.80)
RDW: 11.7 % (ref 11.0–15.0)
WBC: 6 10*3/uL (ref 3.8–10.8)

## 2018-03-15 LAB — HEPATITIS B SURFACE ANTIGEN: HEP B S AG: NONREACTIVE

## 2018-03-15 LAB — PROTIME-INR
INR: 1.1
PROTHROMBIN TIME: 11 s (ref 9.0–11.5)

## 2018-03-15 LAB — HEPATITIS C RNA QUANTITATIVE
HCV QUANT LOG: 6.39 {Log_IU}/mL — AB
HCV RNA, PCR, QN: 2460000 [IU]/mL — AB

## 2018-03-15 LAB — HEPATITIS A ANTIBODY, TOTAL: HEPATITIS A AB,TOTAL: REACTIVE — AB

## 2018-03-15 LAB — HEPATITIS B SURFACE ANTIBODY,QUALITATIVE: HEP B S AB: NONREACTIVE

## 2018-03-27 ENCOUNTER — Telehealth: Payer: Self-pay | Admitting: Neurology

## 2018-03-27 MED ORDER — LACOSAMIDE 150 MG PO TABS: 150.0000 mg | ORAL_TABLET | Freq: Two times a day (BID) | ORAL | 2 refills | Status: DC

## 2018-03-27 NOTE — Telephone Encounter (Signed)
Pt requesting refills for Lacosamide (VIMPAT) 150 MG TABS sent to [facility] (f) [phone]

## 2018-03-27 NOTE — Telephone Encounter (Signed)
Rx printed and given to Dr. Anne Hahn to sign MB RN

## 2018-03-28 NOTE — Telephone Encounter (Signed)
Attempted to fax the Vimpat rx to [phone] 3 times and fax would not sent. I contacted the pt's facility and was advised the [facility] fax # is [phone], received confirmation of fax from the [phone] #. MB RN

## 2018-03-29 ENCOUNTER — Ambulatory Visit: Payer: Medicaid Other

## 2018-03-30 ENCOUNTER — Telehealth: Payer: Self-pay | Admitting: Infectious Diseases

## 2018-04-06 NOTE — Progress Notes (Signed)
Patient needs elastograpy to better understand baseline liver disease before initiation of treatment - unfortunately he did not show to his pharmacy appointment to arrange next steps. Will place order for elastography and try to get in touch with his facility to schedule.

## 2018-04-06 NOTE — Addendum Note (Signed)
Addended by: Blanchard Kelch on: 04/06/2018 08:48 AM   Modules accepted: Orders

## 2018-04-23 NOTE — Progress Notes (Signed)
Will forward to [facility] to schedule.

## 2018-04-23 NOTE — Progress Notes (Signed)
Perfect, thank you

## 2018-04-29 ENCOUNTER — Encounter (HOSPITAL_COMMUNITY): Payer: Self-pay

## 2018-04-29 ENCOUNTER — Emergency Department (HOSPITAL_COMMUNITY)
Admission: EM | Admit: 2018-04-29 | Discharge: 2018-04-30 | Disposition: A | Discharge status: Home / Self Care | Payer: Medicaid Other | Attending: Emergency Medicine | Admitting: Emergency Medicine

## 2018-04-29 ENCOUNTER — Other Ambulatory Visit: Payer: Self-pay

## 2018-04-29 DIAGNOSIS — F17210 Nicotine dependence, cigarettes, uncomplicated: Secondary | ICD-10-CM | POA: Insufficient documentation

## 2018-04-29 DIAGNOSIS — Z7901 Long term (current) use of anticoagulants: Secondary | ICD-10-CM | POA: Diagnosis not present

## 2018-04-29 DIAGNOSIS — F319 Bipolar disorder, unspecified: Secondary | ICD-10-CM | POA: Insufficient documentation

## 2018-04-29 DIAGNOSIS — F1410 Cocaine abuse, uncomplicated: Secondary | ICD-10-CM | POA: Diagnosis not present

## 2018-04-29 DIAGNOSIS — R519 Headache, unspecified: Secondary | ICD-10-CM

## 2018-04-29 DIAGNOSIS — F1020 Alcohol dependence, uncomplicated: Secondary | ICD-10-CM | POA: Insufficient documentation

## 2018-04-29 DIAGNOSIS — Z79899 Other long term (current) drug therapy: Secondary | ICD-10-CM | POA: Insufficient documentation

## 2018-04-29 DIAGNOSIS — R51 Headache: Secondary | ICD-10-CM | POA: Diagnosis present

## 2018-04-29 DIAGNOSIS — I10 Essential (primary) hypertension: Secondary | ICD-10-CM | POA: Insufficient documentation

## 2018-04-29 DIAGNOSIS — B356 Tinea cruris: Secondary | ICD-10-CM

## 2018-04-29 LAB — CBG MONITORING, ED: Glucose-Capillary: 111 mg/dL — ABNORMAL HIGH (ref 70–99)

## 2018-04-29 MED ORDER — DIPHENHYDRAMINE HCL 50 MG/ML IJ SOLN
25.0000 mg | Freq: Once | INTRAMUSCULAR | Status: AC
  Administered 2018-04-29: 25 mg via INTRAVENOUS
  Filled 2018-04-29: qty 1

## 2018-04-29 MED ORDER — SODIUM CHLORIDE 0.9 % IV BOLUS
1000.0000 mL | Freq: Once | INTRAVENOUS | Status: AC
  Administered 2018-04-29: 1000 mL via INTRAVENOUS

## 2018-04-29 MED ORDER — FLUCONAZOLE 150 MG PO TABS
150.0000 mg | ORAL_TABLET | Freq: Once | ORAL | Status: AC
  Administered 2018-04-29: 150 mg via ORAL
  Filled 2018-04-29: qty 1

## 2018-04-29 MED ORDER — METOCLOPRAMIDE HCL 5 MG/ML IJ SOLN
10.0000 mg | Freq: Once | INTRAMUSCULAR | Status: AC
  Administered 2018-04-29: 10 mg via INTRAVENOUS
  Filled 2018-04-29: qty 2

## 2018-04-29 NOTE — ED Triage Notes (Signed)
Pt from [employer]. He reports that his genitals burn and itch x 2 days. He wants some cream on them and states that his nursing home didn't have any. He is screaming in triage.

## 2018-04-29 NOTE — ED Notes (Signed)
Pt continues to yell. Wants to know who brought him to this place?

## 2018-04-29 NOTE — ED Notes (Signed)
Pt has asked to get a wet cloth. He was given a wash cloth and advised not to use soap at this time.

## 2018-04-29 NOTE — ED Notes (Signed)
Pt calling multiple time back to back yelling and asking for pain medications. He has been informed multiple times that he will have to wait for a provider to see him. Pt still yelling for pain medications.

## 2018-04-29 NOTE — ED Provider Notes (Signed)
[facility]-EMERGENCY DEPT Provider Note   CSN: [id number] Arrival date & time: 04/29/18  2108     History   Chief Complaint Chief Complaint  Patient presents with  . Genital Rash    itching and burning    HPI [name] [name] is a 58 y.o. male.  The history is provided by the patient.  He has history of atrial fibrillation anticoagulated on apixaban, hypertension, seizures, bipolar disorder and comes in with complaint of headaches for the last several days.  He is concerned about headaches because he has a history of cerebral aneurysm which has been clipped.  He is also complaining of a rash on his scrotum which is very itchy.  That rash has been present for the last 2 days.  He is not had any treatment for either condition.  He denies fever or chills.  He denies nausea or vomiting.  He denies visual changes.  Past Medical History:  Diagnosis Date  . Aneurysm (HCC)   . Atrial fibrillation with RVR (HCC) 03/03/2017  . ETOHism (HCC)   . Gait abnormality 05/26/2016  . Hepatitis C   . 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 Hypertension   . Seizure [facility](HCC)     Patient Active Problem List   Diagnosis Date Noted  . Cocaine abuse with cocaine-induced mood disorder (HCC) 10/26/2017  . S/P ablation of atrial fibrillation 07/03/2017  . Insomnia 05/07/2017  . Atrial flutter (HCC) 03/03/2017  . Late effect of cerebrovascular accident (CVA) 01/21/2017  . Bipolar disorder (HCC) 01/21/2017  . Migraine headache 01/21/2017  . Atrial flutter with rapid ventricular response (HCC) 01/10/2017  . Syncope 01/10/2017  . Gait abnormality 05/26/2016  . Smoking 01/05/2015  . Chronic pain syndrome 01/05/2015  . Non-healing non-surgical wound 12/12/2014  . Fall   . Paranoia (HCC)   . Dementia with behavioral disturbance (HCC)   . Encephalopathy, hepatic (HCC) 10/09/2014  . 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 Seizures  (HCC) 01/24/2014  . 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/23/2014  . Fracture of olecranon process of ulna 11/08/2012  . Osteomyelitis (HCC) 11/08/2012  . Essential hypertension, benign 10/31/2012  . History of alcohol abuse 10/26/2012  . Chronic hepatitis C with hepatic coma (HCC) 10/26/2012    Past Surgical History:  Procedure Laterality Date  . A-FLUTTER ABLATION N/A 03/03/2017   Procedure: A-FLUTTER ABLATION;  Surgeon: Duke SalviaKlein, Steven C, MD;  [facility];  Service: Cardiovascular;  Laterality: N/A;  . ATRIAL FLUTTER ABLATION  03/03/2017  . CEREBRAL ANEURYSM REPAIR     [facility]  . HARDWARE REMOVAL Left 10/29/2012   Procedure: HARDWARE REMOVAL;  Surgeon: Sheral Apleyimothy D Murphy, MD;  [facility];  Service: Orthopedics;  Laterality: Left;  . I&D EXTREMITY Left 10/26/2012   Procedure: IRRIGATION AND DEBRIDEMENT Left Elbow;  Surgeon: Sheral Apleyimothy D Murphy, MD;  Location: [facility] OR;  Service: Orthopedics;  Laterality: Left;  . I&D EXTREMITY Left 10/29/2012   Procedure: IRRIGATION AND DEBRIDEMENT EXTREMITY, wound vac change, stimulan beads;  Surgeon: Sheral Apleyimothy D Murphy, MD;  Location: [facility] OR;  Service: Orthopedics;  Laterality: Left;  lateral on bean bag  . I&D EXTREMITY Left 11/02/2012   Procedure: IRRIGATION AND DEBRIDEMENT EXTREMITY;  Surgeon: Sheral Apleyimothy D Murphy, MD;  Location: [facility] OR;  Service: Orthopedics;  Laterality: Left;  . INCISION AND DRAINAGE ABSCESS Left 11/02/2012   Procedure: INCISION AND DRAINAGE ABSCESS;  Surgeon: Sheral Apleyimothy D Murphy, MD;  Location: [facility] OR;  Service: Orthopedics;  Laterality: Left;        Home Medications    Prior to Admission medications   Medication Sig Start Date End Date Taking? Authorizing Provider  apixaban (ELIQUIS) 5 MG TABS tablet Take 5 mg by mouth 2 (two) times daily.    [provider]  cholecalciferol (VITAMIN D) 1000 units tablet Take 1,000 Units by mouth daily.    [provider]  diltiazem (DILACOR XR) 120 MG 24 hr capsule  Take 120 mg by mouth daily.    [provider]  Lacosamide (VIMPAT) 150 MG TABS Take 1 tablet (150 mg total) by mouth 2 (two) times daily. 03/27/18   York Spaniel, MD  levETIRAcetam (KEPPRA) 750 MG tablet Take 2 tablets (1,500 mg total) by mouth 2 (two) times daily. 06/26/17   Liberty Handy, PA-C  Melatonin 3 MG TABS Take 3 mg by mouth at bedtime.    [provider]  QUEtiapine (SEROQUEL) 200 MG tablet Take 200 mg by mouth daily.    [provider]  sertraline (ZOLOFT) 100 MG tablet Take 100 mg by mouth daily.    [provider]  SUMAtriptan (IMITREX) 50 MG tablet Take 1 tablet (50 mg total) by mouth See admin instructions. Give 1 tablet (50 mg) by mouth every 24 hours as needed for migraine headache;  May repeat in 2 hours if headache persists or recurs. Do not exceed 100 mg in 24 hours 06/26/17   Liberty Handy, PA-C  traMADol (ULTRAM) 50 MG tablet Take 1 tablet (50 mg total) by mouth every 6 (six) hours as needed. Patient taking differently: Take 50 mg by mouth every 6 (six) hours as needed for moderate pain.  05/26/17   Sharee Holster, NP    Family History Family History  Problem Relation Age of Onset  . Hypertension Mother   . Cancer Mother   . Hypertension Father   . Diabetes Sister   . Cancer Sister   . Cancer Sister     Social History Social History   Tobacco Use  . Smoking status: Current Every Day Smoker    Packs/day: 0.25    Types: Cigarettes  . Smokeless tobacco: Never Used  Substance Use Topics  . Alcohol use: No    Alcohol/week: 0.0 standard drinks    Comment: quit drinking 11/2011 per patient  . Drug use: No     Allergies   Carbamazepine and Depakote [divalproex sodium]   Review of Systems Review of Systems  All other systems reviewed and are negative.    Physical Exam Updated Vital Signs BP (!) 156/83   Pulse 87   Temp 97.7 F (36.5 C) (Oral)   Resp 18   SpO2 97%   Physical Exam Vitals signs and  nursing note reviewed.    58 year old male, resting comfortably and in no acute distress. Vital signs are significant for elevated systolic blood pressure. Oxygen saturation is 97%, which is normal. Head is normocephalic and atraumatic. PERRLA, EOMI. Oropharynx is clear. Neck is nontender and supple without adenopathy or JVD. Back is nontender and there is no CVA tenderness. Lungs are clear without rales, wheezes, or rhonchi. Chest is nontender. Heart has regular rate and rhythm without murmur. Abdomen is soft, flat, nontender without masses or hepatosplenomegaly and peristalsis is normoactive. Genitalia: Circumcised penis.  Testes descended without masses.  Erythema noted of the scrotum without any induration.  This is very tender.  There is no inguinal adenopathy. Extremities have no cyanosis or  edema, full range of motion is present. Skin is warm and dry without other rash. Neurologic: Mental status is normal, cranial nerves are intact, there are no motor or sensory deficits.  ED Treatments / Results  Labs (all labs ordered are listed, but only abnormal results are displayed) Labs Reviewed - No data to display  Radiology Ct Head Wo Contrast  Result Date: 04/30/2018 CLINICAL DATA:  Severe headache EXAM: CT HEAD WITHOUT CONTRAST TECHNIQUE: Contiguous axial images were obtained from the base of the skull through the vertex without intravenous contrast. COMPARISON:  12/08/2017 FINDINGS: Brain: Old bilateral frontal infarcts with encephalomalacia. Diffuse cerebral atrophy. No acute intracranial abnormality. Specifically, no hemorrhage, hydrocephalus, mass lesion, acute infarction, or significant intracranial injury. Vascular: No hyperdense vessel or unexpected calcification. Skull: No acute calvarial abnormality. Sinuses/Orbits: Visualized paranasal sinuses and mastoids clear. Orbital soft tissues unremarkable. Old left medial orbital wall blowout fracture, stable. Other: None IMPRESSION: Old  bifrontal infarcts with encephalomalacia, stable. Cerebral atrophy. No acute intracranial abnormality. Electronically Signed   By: Charlett NoseKevin  Dover M.D.   On: 04/30/2018 00:26    Procedures Procedures   Medications Ordered in ED Medications  sodium chloride 0.9 % bolus 1,000 mL (has no administration in time range)  metoCLOPramide (REGLAN) injection 10 mg (has no administration in time range)  diphenhydrAMINE (BENADRYL) injection 25 mg (has no administration in time range)  fluconazole (DIFLUCAN) tablet 150 mg (has no administration in time range)     Initial Impression / Assessment and Plan / ED Course  I have reviewed the triage vital signs and the nursing notes.  Pertinent labs & imaging results that were available during my care of the patient were reviewed by me and considered in my medical decision making (see chart for details).  Headache, uncertain cause, but appears to be benign.  Given history of cerebral aneurysm and anticoagulated state, will send in for CT of head.  Tinea cruris.  He is given a dose of fluconazole and will be sent home with prescription for same.  Will check CBG to make sure he has not developed diabetes to precipitate severe tinea cruris.  Glucose is not significantly elevated.  CT of head shows no acute process.  He had significant relief of headache with above-noted treatment.  Is given a dose of dexamethasone.  He is discharged with prescription for fluconazole to take once a week for the next 4weeks.  Follow-up with PCP.  Return precautions discussed.  Final Clinical Impressions(s) / ED Diagnoses   Final diagnoses:  Tinea cruris  Bad headache    ED Discharge Orders         Ordered    fluconazole (DIFLUCAN) 200 MG tablet  Weekly     04/30/18 0042           Dione BoozeGlick, Jamiyah Dingley, MD 04/30/18 [id number]

## 2018-04-30 ENCOUNTER — Other Ambulatory Visit (HOSPITAL_COMMUNITY): Payer: Self-pay

## 2018-04-30 ENCOUNTER — Emergency Department (HOSPITAL_COMMUNITY): Payer: Medicaid Other

## 2018-04-30 MED ORDER — DEXAMETHASONE SODIUM PHOSPHATE 10 MG/ML IJ SOLN
10.0000 mg | Freq: Once | INTRAMUSCULAR | Status: AC
  Administered 2018-04-30: 10 mg via INTRAVENOUS
  Filled 2018-04-30: qty 1

## 2018-04-30 MED ORDER — FLUCONAZOLE 200 MG PO TABS: 200.0000 mg | ORAL_TABLET | ORAL | 0 refills | Status: AC

## 2018-05-01 ENCOUNTER — Ambulatory Visit (HOSPITAL_COMMUNITY): Payer: Medicaid Other

## 2018-05-08 ENCOUNTER — Ambulatory Visit (HOSPITAL_COMMUNITY)
Admission: RE | Admit: 2018-05-08 | Discharge: 2018-05-08 | Disposition: A | Discharge status: Home / Self Care | Payer: Medicaid Other | Source: Ambulatory Visit | Attending: Infectious Diseases | Admitting: Infectious Diseases

## 2018-05-08 DIAGNOSIS — B182 Chronic viral hepatitis C: Secondary | ICD-10-CM | POA: Diagnosis not present

## 2018-05-09 ENCOUNTER — Encounter: Payer: Self-pay | Admitting: Gastroenterology

## 2018-05-09 ENCOUNTER — Other Ambulatory Visit: Payer: Self-pay | Admitting: Infectious Diseases

## 2018-05-09 DIAGNOSIS — K7460 Unspecified cirrhosis of liver: Secondary | ICD-10-CM

## 2018-05-09 NOTE — Progress Notes (Signed)
F4 on Fibrotest and F2/3 on elastography but mentions of nodular liver contour consistent with cirrhosis. Will need GI referral.  If he has cirrhosis contributing there is no signs of decompensation and would put him at [facility]ugh A.  Would like to treat with Mavyret x 8 weeks.  He is staying at a facility and may be easier to have him come back for an in person discussion about medication counseling so we can send some instructions with him to facility. Will reach out to scheduling team to get him an appointment with pharmacy as soon as possible.

## 2018-05-17 ENCOUNTER — Ambulatory Visit: Payer: Medicaid Other | Admitting: Pharmacist

## 2018-05-17 ENCOUNTER — Telehealth: Payer: Self-pay | Admitting: Pharmacy Technician

## 2018-05-17 NOTE — Telephone Encounter (Signed)
[facility] Patient Advocate Encounter    Findings of the benefits investigation conducted this morning via test claims for patient's upcoming appointment are as follows:   Insurance: [place] Medicaid is active status   Estimated copay amount: $3.00 Prior Authorization: status to be determined   [facility] Patient Advocate will follow up once patient arrives for their appointment.  Beulah Gandy, CPhT Specialty Pharmacy Patient [facility] for Infectious Disease Phone: [phone] Fax: [phone] 05/17/2018 8:24 AM

## 2018-05-17 NOTE — Progress Notes (Signed)
Patient no showed me today, FYI

## 2018-05-21 ENCOUNTER — Ambulatory Visit: Payer: Self-pay | Admitting: Cardiology

## 2018-05-25 ENCOUNTER — Ambulatory Visit: Payer: Self-pay | Admitting: Cardiology

## 2018-05-25 NOTE — Progress Notes (Deleted)
Patient is here for follow up visit.  Subjective:    ID: [name] [name], male    DOB: January 19, 1961, 58 y.o.   MRN: [id number]  No chief complaint on file.   *** HPI  58 y.o.***male with ***  *** Past Medical History:  Diagnosis Date  . Aneurysm (HCC)   . Atrial fibrillation with RVR (HCC) 03/03/2017  . ETOHism (HCC)   . Gait abnormality 05/26/2016  . Hepatitis C   . 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 Hypertension   . Seizure (HCC)     *** Past Surgical History:  Procedure Laterality Date  . A-FLUTTER ABLATION N/A 03/03/2017   Procedure: A-FLUTTER ABLATION;  Surgeon: Duke Salvia, MD;  Location: [facility] INVASIVE CV LAB;  Service: Cardiovascular;  Laterality: N/A;  . ATRIAL FLUTTER ABLATION  03/03/2017  . CEREBRAL ANEURYSM REPAIR     At [facility]  . HARDWARE REMOVAL Left 10/29/2012   Procedure: HARDWARE REMOVAL;  Surgeon: Sheral Apley, MD;  Location: [facility] OR;  Service: Orthopedics;  Laterality: Left;  . I&D EXTREMITY Left 10/26/2012   Procedure: IRRIGATION AND DEBRIDEMENT Left Elbow;  Surgeon: Sheral Apley, MD;  Location: [facility] OR;  Service: Orthopedics;  Laterality: Left;  . I&D EXTREMITY Left 10/29/2012   Procedure: IRRIGATION AND DEBRIDEMENT EXTREMITY, wound vac change, stimulan beads;  Surgeon: Sheral Apley, MD;  Location: [facility] OR;  Service: Orthopedics;  Laterality: Left;  lateral on bean bag  . I&D EXTREMITY Left 11/02/2012   Procedure: IRRIGATION AND DEBRIDEMENT EXTREMITY;  Surgeon: Sheral Apley, MD;  Location: [facility] OR;  Service: Orthopedics;  Laterality: Left;  . INCISION AND DRAINAGE ABSCESS Left 11/02/2012   Procedure: INCISION AND DRAINAGE ABSCESS;  Surgeon: Sheral Apley, MD;  Location: [facility] OR;  Service: Orthopedics;  Laterality: Left;    *** Social History   Socioeconomic History  . Marital status: Single    Spouse name: Not on file  . Number of children: 3  . Years of education: 57 th  . Highest  education level: Not on file  Occupational History  . Occupation: disabled  Social Needs  . Financial resource strain: Not on file  . Food insecurity:    Worry: Not on file    Inability: Not on file  . Transportation needs:    Medical: Not on file    Non-medical: Not on file  Tobacco Use  . Smoking status: Current Every Day Smoker    Packs/day: 0.25    Types: Cigarettes  . Smokeless tobacco: Never Used  Substance and Sexual Activity  . Alcohol use: No    Alcohol/week: 0.0 standard drinks    Comment: quit drinking 11/2011 per patient  . Drug use: No  . Sexual activity: Not Currently  Lifestyle  . Physical activity:    Days per week: Not on file    Minutes per session: Not on file  . Stress: Not on file  Relationships  . Social connections:    Talks on phone: Not on file    Gets together: Not on file    Attends religious service: Not on file    Active member of club or organization: Not on file    Attends meetings of clubs or organizations: Not on file    Relationship status: Not on file  . Intimate partner violence:    Fear of current or ex partner: Not on file    Emotionally abused: Not on file    Physically abused:  Not on file    Forced sexual activity: Not on file  Other Topics Concern  . Not on file  Social History Narrative   He is currently a resident at [facility] facility     *** Current Outpatient Medications on File Prior to Visit  Medication Sig Dispense Refill  . apixaban (ELIQUIS) 5 MG TABS tablet Take 5 mg by mouth 2 (two) times daily.    . cholecalciferol (VITAMIN D) 1000 units tablet Take 1,000 Units by mouth daily.    Marland Kitchen diltiazem (DILACOR XR) 120 MG 24 hr capsule Take 120 mg by mouth daily.    . Lacosamide (VIMPAT) 150 MG TABS Take 1 tablet (150 mg total) by mouth 2 (two) times daily. 60 tablet 2  . levETIRAcetam (KEPPRA) 750 MG tablet Take 2 tablets (1,500 mg total) by mouth 2 (two) times daily. 30 tablet 0  . Melatonin 3 MG TABS Take 3 mg by mouth  at bedtime.    Marland Kitchen QUEtiapine (SEROQUEL) 200 MG tablet Take 200 mg by mouth daily.    . sertraline (ZOLOFT) 100 MG tablet Take 100 mg by mouth daily.    . SUMAtriptan (IMITREX) 50 MG tablet Take 1 tablet (50 mg total) by mouth See admin instructions. Give 1 tablet (50 mg) by mouth every 24 hours as needed for migraine headache;  May repeat in 2 hours if headache persists or recurs. Do not exceed 100 mg in 24 hours 10 tablet 0  . traMADol (ULTRAM) 50 MG tablet Take 1 tablet (50 mg total) by mouth every 6 (six) hours as needed. (Patient taking differently: Take 50 mg by mouth every 6 (six) hours as needed for moderate pain. ) 30 tablet 0   No current facility-administered medications on file prior to visit.     Cardiovascular studies:  *** Echocardiogram 11/24/2017: Left ventricle cavity is normal in size. Moderate concentric hypertrophy of the left ventricle. Normal global wall motion. Visual EF is 55-60%. Normal diastolic filling pattern. Left atrial cavity is normal in size. Aneurysmal interatrial septum without PFO Mild mitral valve leaflet thickening. Trace mitral regurgitation. The aortic root is dilated at 4.0 cm.  EKG 12/13/2017: Sinus rhythm 79 bpm. Normal axis. Normal conduction. Left atrial enlargement. Otherwise normal EKG.  EKG 10/20/2017 (Outside EKG): Possible atypical atrial flutter 140 bpm. Normal axis. Normal conduction, Nonspecific ST-T changes.  A. Flutter Ablation 03/03/17 by Duke Salvia, MD  Recent labs:  *** Labs 09/12/2017: H/H 17/49. MCV 101, platelets 154 Glucose 99. BUN/creatinine 5/0.71. Sodium 139, potassium 5.0 Cholesterol 108, triglycerides 59, HDL 34, LDL 62 HbA1c 5.6% Review of Systems  Constitution: Negative. Negative for fever, malaise/fatigue, weight gain and weight loss.  HENT: Negative for sore throat.   Eyes: Negative for visual disturbance.  Cardiovascular: Negative.  Negative for chest pain, claudication, dyspnea on exertion, leg swelling,  palpitations and syncope.  Respiratory: Negative.  Negative for cough and shortness of breath.   Hematologic/Lymphatic: Negative.   Skin: Negative.   Musculoskeletal: Negative for back pain, joint swelling and muscle weakness.  Gastrointestinal: Negative for abdominal pain, change in bowel habit, nausea and vomiting.  Neurological: Negative.   Psychiatric/Behavioral: Negative for depression and substance abuse.  All other systems reviewed and are negative.      Objective:   *** There were no vitals filed for this visit.   Physical Exam  Constitutional: He appears well-developed and well-nourished. No distress.  HENT:  Head: Atraumatic.  Eyes: Conjunctivae are normal.  Neck: Neck supple. No JVD  present. No thyromegaly present.  Cardiovascular: Normal rate, regular rhythm, normal heart sounds and intact distal pulses. Exam reveals no gallop.  No murmur heard. Pulmonary/Chest: Effort normal and breath sounds normal.  Abdominal: Soft. Bowel sounds are normal.  Musculoskeletal: Normal range of motion.        General: No edema.  Neurological: He is alert.  Skin: Skin is warm and dry.  Psychiatric: He has a normal mood and affect.        Assessment & Recommendations:   ***  There are no diagnoses linked to this encounter.   Elder Negus, MD [facility]. [place] Pager: [phone] Office: [phone] If no answer Cell [phone]

## 2018-05-30 ENCOUNTER — Ambulatory Visit: Payer: Self-pay | Admitting: Gastroenterology

## 2018-05-30 NOTE — Progress Notes (Signed)
Patient is here for follow up visit.  Subjective:    ID: [name] [name], male    DOB: 04/14/1960, 58 y.o.   MRN: [id number]  Chief Complaint  Patient presents with  . Atrial Flutter  . Hypertension  . Follow-up    29mo    HPI  58 year old African-American male with remote history of brain aneurysm status post surgery at [facility] in [place], polysubstance abuse including tobacco, alcohol, cocaine, and marijuana, chronic hepatitis C, complex psychiatric history including depression, alcohol dependence, history of seizure disorder.   I had seen the patient in 10/2017 and 11/2017.   My note from 10/2017. While first EKG was suspoicious for atypical atrial flutter, subsequent EKG shos sinus rhythm. I do not have any EKG that defnitively shows Afib. His CHA2DS2VAsc score, if we consider CVA as a risk factor, is 3. I do have questions, if his CVA was due to his "cereberal aneurysm", in which case it should not be considered a risk factor for thrombolembolic event from possible flutter. Without this factor, his score is 1, suggestive of low risk for future CVA. Moreover, I am concerened about his polysubstance abuse and psychiatric history. Based on the limited information available to me, the risks of anticoagulation are higher than benefit.  Recommend starting low dose diltiazem 120 mg daily for hypertension, and possible atrial flutter. I will obtain previous records from [facility] [place]. I will also obtain echocardiogram. I briefly counseled the patient regarding polysubstance abuse.  Continue follow up with Dr. Julio Sicks. Continue current psychiatric care.  It appears that he has been placed on Eliquis since then.  I reviewed patient's chart in detail today.  It appears that he underwent atrial flutter ablation in 02/2017, and was recommended to stop anticoagulation subsequently.  I reviewed his EKGs performed in our office in 2019.  While 1 EKG was then  thought to show atypical atrial flutter, closer review shows that there is a clear P wave and that EKG represents sinus tachycardia.  Patient is currently staying at an assisted living facility. He is a very poor historian.  He does not know why he is here today and continues to ask me questions about why his equilibrium is off and he does not remember any events.  He is very frustrated with his inability to remember things.  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 He also tells me that he falls frequently.   He does not recollect seeing a primary care doctor and asks me if he has one. During this discussion, he randomly makes comments such as "I like your shoes, I like your watch:Marland Kitchen  He has had ED visits in the recent past with documentation regarding erratic behavior.   Past Medical History:  Diagnosis Date  . Aneurysm (HCC)   . Atrial fibrillation with RVR (HCC) 03/03/2017  . ETOHism (HCC)   . Gait abnormality 05/26/2016  . Hepatitis C   . 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 Hypertension   . Seizure [facility])      Past Surgical History:  Procedure Laterality Date  . A-FLUTTER ABLATION N/A 03/03/2017   Procedure: A-FLUTTER ABLATION;  Surgeon: Duke Salvia, MD;  Location: [facility] INVASIVE CV LAB;  Service: Cardiovascular;  Laterality: N/A;  . ATRIAL FLUTTER ABLATION  03/03/2017  . CEREBRAL ANEURYSM REPAIR     At [facility]  . HARDWARE REMOVAL Left 10/29/2012   Procedure: HARDWARE REMOVAL;  Surgeon: Sheral Apley, MD;  Location: [facility] OR;  Service: Orthopedics;  Laterality: Left;  . I&D EXTREMITY Left 10/26/2012   Procedure: IRRIGATION AND DEBRIDEMENT Left Elbow;  Surgeon: Sheral Apley, MD;  Location: [facility] OR;  Service: Orthopedics;  Laterality: Left;  . I&D EXTREMITY Left 10/29/2012   Procedure: IRRIGATION AND DEBRIDEMENT EXTREMITY, wound vac change, stimulan beads;  Surgeon: Sheral Apley, MD;  Location: [facility] OR;   Service: Orthopedics;  Laterality: Left;  lateral on bean bag  . I&D EXTREMITY Left 11/02/2012   Procedure: IRRIGATION AND DEBRIDEMENT EXTREMITY;  Surgeon: Sheral Apley, MD;  Location: [facility] OR;  Service: Orthopedics;  Laterality: Left;  . INCISION AND DRAINAGE ABSCESS Left 11/02/2012   Procedure: INCISION AND DRAINAGE ABSCESS;  Surgeon: Sheral Apley, MD;  Location: [facility] OR;  Service: Orthopedics;  Laterality: Left;     Social History   Socioeconomic History  . Marital status: Single    Spouse name: Not on file  . Number of children: 3  . Years of education: 12 th  . Highest education level: Not on file  Occupational History  . Occupation: disabled  Social Needs  . Financial resource strain: Not on file  . Food insecurity:    Worry: Not on file    Inability: Not on file  . Transportation needs:    Medical: Not on file    Non-medical: Not on file  Tobacco Use  . Smoking status: Current Some Day Smoker    Packs/day: 0.25    Types: Cigarettes  . Smokeless tobacco: Never Used  Substance and Sexual Activity  . Alcohol use: No    Alcohol/week: 0.0 standard drinks    Comment: quit drinking 11/2011 per patient  . Drug use: No  . Sexual activity: Not Currently  Lifestyle  . Physical activity:    Days per week: Not on file    Minutes per session: Not on file  . Stress: Not on file  Relationships  . Social connections:    Talks on phone: Not on file    Gets together: Not on file    Attends religious service: Not on file    Active member of club or organization: Not on file    Attends meetings of clubs or organizations: Not on file    Relationship status: Not on file  . Intimate partner violence:    Fear of current or ex partner: Not on file    Emotionally abused: Not on file    Physically abused: Not on file    Forced sexual activity: Not on file  Other Topics Concern  . Not on file  Social History Narrative   He is currently a resident at [facility] facility       Current Outpatient Medications on File Prior to Visit  Medication Sig Dispense Refill  . cholecalciferol (VITAMIN D) 1000 units tablet Take 1,000 Units by mouth daily.    Marland Kitchen diltiazem (DILACOR XR) 120 MG 24 hr capsule Take 120 mg by mouth daily.    . Lacosamide (VIMPAT) 150 MG TABS Take 1 tablet (150 mg total) by mouth 2 (two) times daily. 60 tablet 2  . Latanoprost 0.005 % EMUL Apply to eye at bedtime.    . levETIRAcetam (KEPPRA) 750 MG tablet Take 2 tablets (1,500 mg total) by mouth 2 (two) times daily. 30 tablet 0  . Melatonin 3 MG TABS Take 3 mg by mouth at bedtime.    Marland Kitchen QUEtiapine (SEROQUEL) 200 MG tablet Take 200  mg by mouth daily.    . sertraline (ZOLOFT) 100 MG tablet Take 100 mg by mouth daily.    . SUMAtriptan (IMITREX) 50 MG tablet Take 1 tablet (50 mg total) by mouth See admin instructions. Give 1 tablet (50 mg) by mouth every 24 hours as needed for migraine headache;  May repeat in 2 hours if headache persists or recurs. Do not exceed 100 mg in 24 hours 10 tablet 0   No current facility-administered medications on file prior to visit.     Cardiovascular studies:  EKG 05/31/2018: Sinus rhythm 82 bpm.  Left atrial enlargement  EKG 10/20/2017: Sinus rhythm 79 bpm. Normal axis. Normal conduction. Left atrial enlargement. Otherwise normal EKG.   EKG 10/20/2017: Sinus tachycardia. 140 bpm. Normal axis. Normal conduction, Nonspecific ST-T changes.  Echocardiogram 11/24/2017: Left ventricle cavity is normal in size. Moderate concentric hypertrophy of the left ventricle. Normal global wall motion. Visual EF is 55-60%. Normal diastolic filling pattern. Left atrial cavity is normal in size. Aneurysmal interatrial septum without PFO Mild mitral valve leaflet thickening. Trace mitral regurgitation. The aortic root is dilated at 4.0 cm.  A-FLUTTER ABLATION 03/03/17: EPS And RF catheter ablation.   Recent labs:  CBC Latest Ref Rng & Units 03/08/2018  WBC 3.8 - 10.8 Thousand/uL 6.0   Hemoglobin 13.2 - 17.1 g/dL 16.116.2  Hematocrit 09.638.5 - 50.0 % 46.8  Platelets 140 - 400 Thousand/uL 166   CMP Latest Ref Rng & Units 03/08/2018  Glucose 65 - 99 mg/dL 045(W112(H)  BUN 7 - 25 mg/dL 17  Creatinine 0.980.70 - 1.191.33 mg/dL 1.470.87  Sodium 829135 - 562146 mmol/L 141  Potassium 3.5 - 5.3 mmol/L 4.2  Chloride 98 - 110 mmol/L 105  CO2 20 - 32 mmol/L 28  Calcium 8.6 - 10.3 mg/dL 9.6  Total Protein 6.1 - 8.1 g/dL 7.3  Total Bilirubin 0.2 - 1.2 mg/dL 0.5  Alkaline Phos 38 - 126 U/L -  AST 10 - 35 U/L 18  ALT 9 - 46 U/L 15    09/12/2017: Lipids: Cholesterol 108, triglycerides 59, HDL 34, LDL 62 HbA1c 5.6%   Review of Systems  Constitution: Negative for decreased appetite, malaise/fatigue, weight gain and weight loss.  HENT: Negative for congestion.   Eyes: Negative for visual disturbance.  Cardiovascular: Negative for chest pain, dyspnea on exertion, leg swelling, palpitations and syncope.  Respiratory: Negative for shortness of breath.   Endocrine: Negative for cold intolerance.  Hematologic/Lymphatic: Does not bruise/bleed easily.  Skin: Negative for itching and rash.  Musculoskeletal: Negative for myalgias.  Gastrointestinal: Negative for abdominal pain, nausea and vomiting.  Genitourinary: Negative for dysuria.  Neurological: Negative for dizziness and weakness.  Psychiatric/Behavioral: Positive for memory loss. The patient is not nervous/anxious.   All other systems reviewed and are negative.      Objective:    Vitals:   05/31/18 1112  BP: 130/82  Pulse: 88  SpO2: 93%     Physical Exam  Constitutional: He appears well-developed and well-nourished. No distress.  HENT:  Head: Atraumatic.  Eyes: Conjunctivae are normal.  Neck: Neck supple. No JVD present. No thyromegaly present.  Cardiovascular: Normal rate, regular rhythm, normal heart sounds and intact distal pulses. Exam reveals no gallop.  No murmur heard. Pulmonary/Chest: Effort normal and breath sounds normal.   Abdominal: Soft. Bowel sounds are normal.  Musculoskeletal: Normal range of motion.        General: No edema.  Neurological: He is alert.  Skin: Skin is warm and dry.  Psychiatric:  Fight of ideas        Assessment & Recommendations:    58 year old African-American male with history of atrial flutter status post catheter ablation in 2018, remote brain aneurysm status post surgery at [facility] in [place], h/o polysubstance abuse including tobacco, alcohol, cocaine, and marijuana, chronic hepatitis C, complex psychiatric history including depression, alcohol dependence, history of seizure disorder, h/o bilateral frontal lobe infarcts (seen on CT head)  H/o atrial flutter: No recurrence of atrial flutter seen on EKG since ablation in 2018.  CHA2DS2VASc score 2, annual stroke risk 2.2% His risks of bleeding is higher with h/p cerebral aneurysm, frequent falls. I have stopped his Eliquis for this reason.  I am concerned about patient's overall cognition, flight of ideas. He will benefit from evaluation and management of underlying psychiatric illness.  I will see him on as needed basis.     Elder Negus, MD [facility] Cardiovascular. [place] Pager: [phone] Office: [phone] If no answer Cell [phone]

## 2018-05-31 ENCOUNTER — Encounter: Payer: Self-pay | Admitting: Cardiology

## 2018-05-31 ENCOUNTER — Ambulatory Visit (INDEPENDENT_AMBULATORY_CARE_PROVIDER_SITE_OTHER): Payer: Medicaid Other | Admitting: Cardiology

## 2018-05-31 ENCOUNTER — Other Ambulatory Visit: Payer: Self-pay

## 2018-05-31 VITALS — BP 130/82 | HR 88 | Ht 76.0 in | Wt 202.4 lb

## 2018-05-31 DIAGNOSIS — I4892 Unspecified atrial flutter: Secondary | ICD-10-CM

## 2018-05-31 DIAGNOSIS — Z8679 Personal history of other diseases of the circulatory system: Secondary | ICD-10-CM

## 2018-05-31 DIAGNOSIS — Z9889 Other specified postprocedural states: Secondary | ICD-10-CM | POA: Insufficient documentation

## 2018-06-07 NOTE — Telephone Encounter (Signed)
Opened in error

## 2018-08-14 ENCOUNTER — Telehealth: Payer: Self-pay | Admitting: Neurology

## 2018-08-14 NOTE — Telephone Encounter (Signed)
Due to current COVID 19 pandemic, our office is severely reducing in office visits until further notice, in order to minimize the risk to our patients and healthcare providers.   Called patient and spoke with scheduler from [facility]. She advised that the facility is on lock down due to COVID and a telephone visit is best for this appt, as this is all that is accessible at this time. I told her that she will receive 3 calls: one from RN to update pt chart, 1 from check in aprx. 30 minutes prior to appt, then finally doctor will call around appt time. She verbalized understanding.  Pt understands that although there may be some limitations with this type of visit, we will take all precautions to reduce any security or privacy concerns.  Pt understands that this will be treated like an in office visit and we will file with pt's insurance, and there may be a patient responsible charge related to this service.

## 2018-08-16 NOTE — Telephone Encounter (Signed)
Reached out to Med tech at [employer] to update EMR for 08/20/18 video visit with Dr. Anne Hahn. Med tech Elonda Husky is going to fax EMR so I can have copy on file for visit.

## 2018-08-20 ENCOUNTER — Ambulatory Visit (INDEPENDENT_AMBULATORY_CARE_PROVIDER_SITE_OTHER): Payer: Medicaid Other | Admitting: Neurology

## 2018-08-20 ENCOUNTER — Encounter: Payer: Self-pay | Admitting: Neurology

## 2018-08-20 ENCOUNTER — Other Ambulatory Visit: Payer: Self-pay

## 2018-08-20 DIAGNOSIS — F0781 Postconcussional syndrome: Secondary | ICD-10-CM

## 2018-08-20 DIAGNOSIS — R569 Unspecified convulsions: Secondary | ICD-10-CM | POA: Diagnosis not present

## 2018-08-20 HISTORY — DX: Postconcussional syndrome: F07.81

## 2018-08-20 NOTE — Progress Notes (Signed)
     Virtual Visit via Telephone Note  I connected with [name] [name] on 08/20/18 at 10:30 AM EDT by telephone and verified that I am speaking with the correct person using two identifiers.  Location: Patient: The patient is in an extended care facility Provider: Physician in office.   I discussed the limitations, risks, security and privacy concerns of performing an evaluation and management service by telephone and the availability of in person appointments. I also discussed with the patient that there may be a patient responsible charge related to this service. The patient expressed understanding and agreed to proceed.   Observations/Objective: [name] [name] is a 58 year old right-handed black male with a history of prior cocaine abuse and a history of a posttraumatic encephalopathy.  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  He has a chronic gait disorder.  The patient fortunately now is in an extended care facility where he is being cared for.  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  The patient is on Vimpat for seizures, he claims he does not know if he has had a seizure or not, he has not been told that he has had a witnessed seizure episode.  He continues to have ongoing memory issues, he admits to being confused quite a bit.  Assessment and Plan: The telephone evaluation reveals that the patient is alert and cooperative, speech is slow and deliberate, not aphasic or dysarthric.  The [facility]-blind evaluation reveals a total score of 8/22.  Follow Up Instructions: 1.  Posttraumatic encephalopathy, dementia  2.  History of seizures  3.  Gait disorder  The patient will remain on Vimpat for now.  We will follow-up in about 6 months, we will follow the memory issues over time.  The patient himself is not clear whether or not he has had a seizure event or not.  Nothing has been  reported to this office through his extended care facility.  He will follow-up in 6 months, may see nurse practitioner.   I discussed the assessment and treatment plan with the patient. The patient was provided an opportunity to ask questions and all were answered. The patient agreed with the plan and demonstrated an understanding of the instructions.   The patient was advised to call back or seek an in-person evaluation if the symptoms worsen or if the condition fails to improve as anticipated.  I provided 15 minutes of non-face-to-face time during this encounter.   York Spaniel, MD

## 2018-08-22 ENCOUNTER — Telehealth: Payer: Self-pay

## 2018-08-22 NOTE — Telephone Encounter (Signed)
Left message for Judeth Cornfield at [employer] ( pt's scheduler) asking her to call me back so we can schedule 6 month f/u. Ok for pt to see NP.

## 2018-12-27 ENCOUNTER — Telehealth: Payer: Self-pay | Admitting: Neurology

## 2018-12-27 MED ORDER — VIMPAT 150 MG PO TABS: 150.0000 mg | ORAL_TABLET | Freq: Two times a day (BID) | ORAL | 1 refills | Status: AC

## 2018-12-27 MED ORDER — LEVETIRACETAM 750 MG PO TABS: 1500.0000 mg | ORAL_TABLET | Freq: Two times a day (BID) | ORAL | 1 refills | Status: AC

## 2018-12-27 MED ORDER — LEVETIRACETAM 750 MG PO TABS: 1500.0000 mg | ORAL_TABLET | Freq: Two times a day (BID) | ORAL | 1 refills | Status: DC

## 2018-12-27 MED ORDER — VIMPAT 150 MG PO TABS: 150.0000 mg | ORAL_TABLET | Freq: Two times a day (BID) | ORAL | 1 refills | Status: DC

## 2018-12-27 NOTE — Addendum Note (Signed)
Addended by: Kathrynn Ducking on: 12/27/2018 05:36 PM   Modules accepted: Orders

## 2018-12-27 NOTE — Telephone Encounter (Signed)
Pharmacist [place] states the facility where pt lives called her stating pt is out of both levETIRAcetam (KEPPRA) 750 MG tablet And Lacosamide (VIMPAT) 150 MG TABS A refill is asking to be called into [facility] ph [phone] fax [phone]

## 2018-12-27 NOTE — Telephone Encounter (Signed)
Patient apparently has been on Keppra previously, I will call in both prescriptions, the [place] previously was sent in through another physician.

## 2019-01-14 ENCOUNTER — Other Ambulatory Visit: Payer: Self-pay | Admitting: Neurology

## 2019-02-04 ENCOUNTER — Other Ambulatory Visit: Payer: Self-pay

## 2019-02-04 DIAGNOSIS — Z20822 Contact with and (suspected) exposure to covid-19: Secondary | ICD-10-CM

## 2019-02-06 LAB — NOVEL CORONAVIRUS, NAA: SARS-CoV-2, NAA: NOT DETECTED

## 2019-03-20 ENCOUNTER — Other Ambulatory Visit: Payer: Self-pay | Admitting: Neurology

## 2021-01-02 ENCOUNTER — Other Ambulatory Visit: Payer: Self-pay

## 2021-01-02 ENCOUNTER — Encounter (HOSPITAL_COMMUNITY): Payer: Self-pay | Admitting: Emergency Medicine

## 2021-01-02 ENCOUNTER — Emergency Department (HOSPITAL_COMMUNITY)
Admission: EM | Admit: 2021-01-02 | Discharge: 2021-01-02 | Disposition: A | Discharge status: Home / Self Care | Payer: Medicaid Other | Attending: Emergency Medicine | Admitting: Emergency Medicine

## 2021-01-02 DIAGNOSIS — I454 Nonspecific intraventricular block: Secondary | ICD-10-CM | POA: Diagnosis not present

## 2021-01-02 DIAGNOSIS — I10 Essential (primary) hypertension: Secondary | ICD-10-CM | POA: Insufficient documentation

## 2021-01-02 DIAGNOSIS — F17210 Nicotine dependence, cigarettes, uncomplicated: Secondary | ICD-10-CM | POA: Diagnosis not present

## 2021-01-02 DIAGNOSIS — F0390 Unspecified dementia without behavioral disturbance: Secondary | ICD-10-CM | POA: Diagnosis not present

## 2021-01-02 DIAGNOSIS — I440 Atrioventricular block, first degree: Secondary | ICD-10-CM | POA: Diagnosis not present

## 2021-01-02 HISTORY — DX: Unspecified dementia, unspecified severity, without behavioral disturbance, psychotic disturbance, mood disturbance, and anxiety: F03.90

## 2021-01-02 LAB — COMPREHENSIVE METABOLIC PANEL WITH GFR
ALT: 25 U/L (ref 0–44)
AST: 27 U/L (ref 15–41)
Albumin: 3.6 g/dL (ref 3.5–5.0)
Alkaline Phosphatase: 65 U/L (ref 38–126)
Anion gap: 9 (ref 5–15)
BUN: 13 mg/dL (ref 8–23)
CO2: 25 mmol/L (ref 22–32)
Calcium: 8.6 mg/dL — ABNORMAL LOW (ref 8.9–10.3)
Chloride: 108 mmol/L (ref 98–111)
Creatinine, Ser: 1.2 mg/dL (ref 0.61–1.24)
GFR, Estimated: >60 mL/min
Glucose, Bld: 126 mg/dL — ABNORMAL HIGH (ref 70–99)
Potassium: 3.7 mmol/L (ref 3.5–5.1)
Sodium: 142 mmol/L (ref 135–145)
Total Bilirubin: 0.7 mg/dL (ref 0.3–1.2)
Total Protein: 7 g/dL (ref 6.5–8.1)

## 2021-01-02 LAB — CBC WITH DIFFERENTIAL/PLATELET
Abs Immature Granulocytes: 0.02 K/uL (ref 0.00–0.07)
Basophils Absolute: 0 K/uL (ref 0.0–0.1)
Basophils Relative: 0 %
Eosinophils Absolute: 0.1 K/uL (ref 0.0–0.5)
Eosinophils Relative: 1 %
HCT: 46.2 % (ref 39.0–52.0)
Hemoglobin: 15.1 g/dL (ref 13.0–17.0)
Immature Granulocytes: 0 %
Lymphocytes Relative: 71 %
Lymphs Abs: 4.7 K/uL — ABNORMAL HIGH (ref 0.7–4.0)
MCH: 28 pg (ref 26.0–34.0)
MCHC: 32.7 g/dL (ref 30.0–36.0)
MCV: 85.6 fL (ref 80.0–100.0)
Monocytes Absolute: 0.5 K/uL (ref 0.1–1.0)
Monocytes Relative: 8 %
Neutro Abs: 1.4 K/uL — ABNORMAL LOW (ref 1.7–7.7)
Neutrophils Relative %: 20 %
Platelets: 143 K/uL — ABNORMAL LOW (ref 150–400)
RBC: 5.4 MIL/uL (ref 4.22–5.81)
RDW: 15.8 % — ABNORMAL HIGH (ref 11.5–15.5)
WBC: 6.7 K/uL (ref 4.0–10.5)
nRBC: 0 % (ref 0.0–0.2)

## 2021-01-02 NOTE — ED Provider Notes (Signed)
Emergency Medicine Provider Triage Evaluation Note  [name] [name] , a 60 y.o. male  was evaluated in triage.  Level 5 caveat secondary to dementia.  History provided by caretaker who is at bedside.  Patient has been more argumentative and disagreeable over the last month.  He has been acting more confused, not following instructions well.  No changes in medicine, no recent falls or injuries.  Review of Systems  Positive: Behavior problems, agitation Negative: Fevers, pain  Physical Exam  BP 107/72 (BP Location: Right Arm)   Pulse 65   Temp 97.9 F (36.6 C) (Oral)   Resp 14   SpO2 96%  Gen:   Awake, no distress   Resp:  Normal effort  MSK:   Moves extremities without difficulty  Other:    Medical Decision Making  Medically screening exam initiated at 10:00 AM.  Appropriate orders placed.  Charleton Deyoung was informed that the remainder of the evaluation will be completed by another provider, this initial triage assessment does not replace that evaluation, and the importance of remaining in the ED until their evaluation is complete.  Behavior problems, will check basic labs and urine for signs of infection.   Theron Arista, PA-C 01/02/21 1001    Benjiman Core, MD 01/02/21 1054

## 2021-01-02 NOTE — ED Triage Notes (Signed)
Pt from group home with history of dementia.  Group home worker states that they believe dementia is worse and pt is smoking in room and not following instructions to not smoke inside.  They want pt reevaluated.

## 2021-01-02 NOTE — Discharge Instructions (Signed)
Please remember to not smoke around oxygen tanks as this could be a health hazard. If you have any acute concerns please let us or your primary care physician know.

## 2021-01-02 NOTE — ED Notes (Signed)
Facility called and stated they will send someone to pick him up

## 2021-01-02 NOTE — ED Provider Notes (Signed)
[facility] EMERGENCY DEPARTMENT Provider Note   CSN: [id number] Arrival date & time: 01/02/21  0944     History No chief complaint on file.   [name] [name] is a 60 y.o. male presenting with worsening dementia. PMH is significant for dementia, seizure, HTN, Afib. Patient presents to the ER after having been brought by a caretaker at [facility] assisted living. Patient was alone when interviewed and was unable to contact the facility using the numbers listed in the patient's chart.   Patient reports that he doesn't know if he is confused or not but that the facility people are making him confused by not answering his questions regarding his money and their operations. Patient states that he is not in any pain, feels okay overall and does not have hallucinations.      Past Medical History:  Diagnosis Date   Aneurysm (HCC)    Atrial fibrillation with RVR (HCC) 03/03/2017   Dementia (HCC)    ETOHism (HCC)    Gait abnormality 05/26/2016   Hepatitis C    Hereditary and idiopathic peripheral neuropathy 06/03/2014   Hypertension    Posttraumatic encephalopathy 08/20/2018   Seizure [facility])     Patient Active Problem List   Diagnosis Date Noted   Posttraumatic encephalopathy 08/20/2018   H/O prior ablation treatment 05/31/2018   Cocaine abuse with cocaine-induced mood disorder (HCC) 10/26/2017   S/P ablation of atrial fibrillation 07/03/2017   Insomnia 05/07/2017   Atrial flutter (HCC) 03/03/2017   Late effect of cerebrovascular accident (CVA) 01/21/2017   Bipolar disorder (HCC) 01/21/2017   Migraine headache 01/21/2017   Atrial flutter with rapid ventricular response (HCC) 01/10/2017   Syncope 01/10/2017   Gait abnormality 05/26/2016   Smoking 01/05/2015   Chronic pain syndrome 01/05/2015   Non-healing non-surgical wound 12/12/2014   Fall    Paranoia [facility]    Dementia with behavioral disturbance    Encephalopathy, hepatic 10/09/2014   Hereditary and  idiopathic peripheral neuropathy 06/03/2014   Seizures (HCC) 01/24/2014   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/23/2014   Fracture of olecranon process of ulna 11/08/2012   Osteomyelitis (HCC) 11/08/2012   Essential hypertension, benign 10/31/2012   History of alcohol abuse 10/26/2012   Chronic hepatitis C with hepatic coma (HCC) 10/26/2012    Past Surgical History:  Procedure Laterality Date   A-FLUTTER ABLATION N/A 03/03/2017   Procedure: A-FLUTTER ABLATION;  Surgeon: Duke Salvia, MD;  Location: [facility] INVASIVE CV LAB;  Service: Cardiovascular;  Laterality: N/A;   ATRIAL FLUTTER ABLATION  03/03/2017   CEREBRAL ANEURYSM REPAIR     At [facility]   HARDWARE REMOVAL Left 10/29/2012   Procedure: HARDWARE REMOVAL;  Surgeon: Sheral Apley, MD;  Location: [facility] OR;  Service: Orthopedics;  Laterality: Left;   I & D EXTREMITY Left 10/26/2012   Procedure: IRRIGATION AND DEBRIDEMENT Left Elbow;  Surgeon: Sheral Apley, MD;  Location: [facility] OR;  Service: Orthopedics;  Laterality: Left;   I & D EXTREMITY Left 10/29/2012   Procedure: IRRIGATION AND DEBRIDEMENT EXTREMITY, wound vac change, stimulan beads;  Surgeon: Sheral Apley, MD;  Location: [facility] OR;  Service: Orthopedics;  Laterality: Left;  lateral on bean bag   I & D EXTREMITY Left 11/02/2012   Procedure: IRRIGATION AND DEBRIDEMENT EXTREMITY;  Surgeon: Sheral Apley, MD;  Location: [facility] OR;  Service: Orthopedics;  Laterality: Left;   INCISION AND DRAINAGE ABSCESS Left 11/02/2012   Procedure: INCISION AND DRAINAGE ABSCESS;  Surgeon: Sheral Apley, MD;  Location: [facility] OR;  Service: Orthopedics;  Laterality: Left;       Family History  Problem Relation Age of Onset   Hypertension Mother    Cancer Mother    Hypertension Father    Diabetes Sister    Cancer Sister    Cancer Sister     Social History   Tobacco Use   Smoking status: Some Days    Packs/day: 0.25    Types: Cigarettes   Smokeless tobacco: Never  Substance Use Topics    Alcohol use: No    Alcohol/week: 0.0 standard drinks    Comment: quit drinking 11/2011 per patient   Drug use: No    Home Medications Prior to Admission medications   Medication Sig Start Date End Date Taking? Authorizing Provider  cholecalciferol (VITAMIN D) 1000 units tablet Take 1,000 Units by mouth daily.    [provider]  diltiazem (DILACOR XR) 120 MG 24 hr capsule Take 120 mg by mouth daily.    [provider]  Lacosamide (VIMPAT) 150 MG TABS Take 1 tablet (150 mg total) by mouth 2 (two) times daily. 12/27/18   York Spaniel, MD  Latanoprost 0.005 % EMUL Apply to eye at bedtime.    [provider]  levETIRAcetam (KEPPRA) 750 MG tablet Take 2 tablets (1,500 mg total) by mouth 2 (two) times daily. 12/27/18   York Spaniel, MD  Melatonin 3 MG TABS Take 3 mg by mouth at bedtime.    [provider]  QUEtiapine (SEROQUEL) 200 MG tablet Take 200 mg by mouth daily.    [provider]  sertraline (ZOLOFT) 100 MG tablet Take 100 mg by mouth daily.    [provider]  SUMAtriptan (IMITREX) 50 MG tablet Take 1 tablet (50 mg total) by mouth See admin instructions. Give 1 tablet (50 mg) by mouth every 24 hours as needed for migraine headache;  May repeat in 2 hours if headache persists or recurs. Do not exceed 100 mg in 24 hours 06/26/17   Liberty Handy, PA-C    Allergies    Carbamazepine and Depakote [divalproex sodium]  Review of Systems   Review of Systems  Constitutional:  Negative for activity change, appetite change, chills, diaphoresis and fatigue.  HENT:  Negative for congestion.   Eyes:  Negative for visual disturbance.  Respiratory:  Negative for choking, shortness of breath and wheezing.   Cardiovascular:  Negative for chest pain, palpitations and leg swelling.  Gastrointestinal:  Negative for abdominal distention, abdominal pain, constipation, diarrhea, nausea and vomiting.  Genitourinary:  Negative for difficulty  urinating.  Musculoskeletal:  Negative for arthralgias.  Neurological:  Negative for weakness and headaches.  Psychiatric/Behavioral:  Negative for agitation and hallucinations.    Physical Exam Updated Vital Signs BP 115/64   Pulse (!) 51   Temp 97.9 F (36.6 C) (Oral)   Resp (!) 21   SpO2 97%   Physical Exam Constitutional:      Appearance: Normal appearance.  HENT:     Head: Normocephalic and atraumatic.     Nose: Nose normal.     Mouth/Throat:     Mouth: Mucous membranes are moist.     Pharynx: Oropharynx is clear.  Eyes:     Pupils: Pupils are equal, round, and reactive to light.  Cardiovascular:     Rate and Rhythm: Normal rate and regular rhythm.  Pulmonary:     Effort: Pulmonary effort is normal.     Breath sounds: Normal breath sounds.  Abdominal:     General: Abdomen is flat. Bowel sounds are normal.     Palpations: Abdomen is soft.  Musculoskeletal:        General: Normal range of motion.     Cervical back: Normal range of motion and neck supple.  Skin:    General: Skin is warm and dry.     Capillary Refill: Capillary refill takes less than 2 seconds.  Neurological:     Mental Status: He is alert. Mental status is at baseline.    ED Results / Procedures / Treatments   Labs (all labs ordered are listed, but only abnormal results are displayed) Labs Reviewed  COMPREHENSIVE METABOLIC PANEL - Abnormal; Notable for the following components:      Result Value   Glucose, Bld 126 (*)    Calcium 8.6 (*)    All other components within normal limits  CBC WITH DIFFERENTIAL/PLATELET - Abnormal; Notable for the following components:   RDW 15.8 (*)    Platelets 143 (*)    Neutro Abs 1.4 (*)    Lymphs Abs 4.7 (*)    All other components within normal limits  URINALYSIS, ROUTINE W REFLEX MICROSCOPIC  PATHOLOGIST SMEAR REVIEW    EKG None Sinus rhythm with 1st degree AV block  Radiology No results found.  Procedures Procedures   Medications Ordered in  ED Medications - No data to display  ED Course  I have reviewed the triage vital signs and the nursing notes.  Pertinent labs & imaging results that were available during my care of the patient were reviewed by me and considered in my medical decision making (see chart for details).    MDM Rules/Calculators/A&P                         [name] [name] is a 60 y.o. male presenting with worsening dementia. PMH is significant for dementia, seizure, HTN, Afib. Patient presents to the ER after having been brought by a caretaker at [facility] assisted living.   Patient does not appear to have any agitation currently, he is able to converse and answer questions appropriately. No significant findings on physical exam. Unsure of why the patient was brought to the ED and not able to get in touch with the facility using the numbers listed in the patient's chart. Consulted social work to get in touch with the facility and to see if there were any other options for the patient.   Labs were obtained in triage and notable for thrombocytopenia of 143 but without concerns at this time.   Discussed case with social work, who was able to get in touch with the facility owner Dorthula Perfect who stated that she is concerned as the patient continues to smoke inside the facility, which is a health concern as there are oxygen tanks present. Patient is stable for discharge back to his facility, patient is calm and cooperative here. Does not appear to be in acute psychiatric crisis, is not a danger to himself or others at this time.   Final Clinical Impression(s) / ED Diagnoses Final diagnoses:  None     Linh Johannes, DO 01/02/21 1433    Milagros Loll, MD 01/04/21 [phone]

## 2021-01-05 LAB — PATHOLOGIST SMEAR REVIEW: Path Review: REACTIVE

## 2021-01-20 ENCOUNTER — Inpatient Hospital Stay (HOSPITAL_COMMUNITY)
Admission: EM | Admit: 2021-01-20 | Discharge: 2021-03-04 | DRG: 540 | Disposition: A | Discharge status: Skilled Nursing Facility | Payer: Medicaid Other | Attending: Family Medicine | Admitting: Family Medicine

## 2021-01-20 ENCOUNTER — Observation Stay (HOSPITAL_COMMUNITY): Payer: Medicaid Other

## 2021-01-20 ENCOUNTER — Emergency Department (HOSPITAL_COMMUNITY): Payer: Medicaid Other

## 2021-01-20 ENCOUNTER — Encounter (HOSPITAL_COMMUNITY): Payer: Self-pay | Admitting: Emergency Medicine

## 2021-01-20 ENCOUNTER — Other Ambulatory Visit: Payer: Self-pay

## 2021-01-20 DIAGNOSIS — F17210 Nicotine dependence, cigarettes, uncomplicated: Secondary | ICD-10-CM | POA: Diagnosis present

## 2021-01-20 DIAGNOSIS — F0393 Unspecified dementia, unspecified severity, with mood disturbance: Secondary | ICD-10-CM | POA: Diagnosis present

## 2021-01-20 DIAGNOSIS — M86172 Other acute osteomyelitis, left ankle and foot: Principal | ICD-10-CM | POA: Diagnosis present

## 2021-01-20 DIAGNOSIS — L03032 Cellulitis of left toe: Secondary | ICD-10-CM

## 2021-01-20 DIAGNOSIS — Z888 Allergy status to other drugs, medicaments and biological substances status: Secondary | ICD-10-CM

## 2021-01-20 DIAGNOSIS — Z79899 Other long term (current) drug therapy: Secondary | ICD-10-CM

## 2021-01-20 DIAGNOSIS — F1010 Alcohol abuse, uncomplicated: Secondary | ICD-10-CM | POA: Diagnosis present

## 2021-01-20 DIAGNOSIS — F17200 Nicotine dependence, unspecified, uncomplicated: Secondary | ICD-10-CM | POA: Diagnosis present

## 2021-01-20 DIAGNOSIS — Z8249 Family history of ischemic heart disease and other diseases of the circulatory system: Secondary | ICD-10-CM

## 2021-01-20 DIAGNOSIS — R001 Bradycardia, unspecified: Secondary | ICD-10-CM | POA: Diagnosis present

## 2021-01-20 DIAGNOSIS — B351 Tinea unguium: Secondary | ICD-10-CM | POA: Diagnosis present

## 2021-01-20 DIAGNOSIS — B182 Chronic viral hepatitis C: Secondary | ICD-10-CM | POA: Diagnosis present

## 2021-01-20 DIAGNOSIS — Z20822 Contact with and (suspected) exposure to covid-19: Secondary | ICD-10-CM | POA: Diagnosis present

## 2021-01-20 DIAGNOSIS — I10 Essential (primary) hypertension: Secondary | ICD-10-CM | POA: Diagnosis present

## 2021-01-20 DIAGNOSIS — Z7901 Long term (current) use of anticoagulants: Secondary | ICD-10-CM

## 2021-01-20 DIAGNOSIS — G40909 Epilepsy, unspecified, not intractable, without status epilepticus: Secondary | ICD-10-CM | POA: Diagnosis present

## 2021-01-20 DIAGNOSIS — I739 Peripheral vascular disease, unspecified: Secondary | ICD-10-CM

## 2021-01-20 DIAGNOSIS — K7460 Unspecified cirrhosis of liver: Secondary | ICD-10-CM | POA: Diagnosis present

## 2021-01-20 DIAGNOSIS — D6959 Other secondary thrombocytopenia: Secondary | ICD-10-CM | POA: Diagnosis present

## 2021-01-20 DIAGNOSIS — I4892 Unspecified atrial flutter: Secondary | ICD-10-CM | POA: Diagnosis present

## 2021-01-20 DIAGNOSIS — M86179 Other acute osteomyelitis, unspecified ankle and foot: Secondary | ICD-10-CM | POA: Diagnosis present

## 2021-01-20 DIAGNOSIS — Z833 Family history of diabetes mellitus: Secondary | ICD-10-CM

## 2021-01-20 DIAGNOSIS — F319 Bipolar disorder, unspecified: Secondary | ICD-10-CM | POA: Diagnosis present

## 2021-01-20 DIAGNOSIS — M869 Osteomyelitis, unspecified: Secondary | ICD-10-CM

## 2021-01-20 LAB — CBC WITH DIFFERENTIAL/PLATELET
Abs Immature Granulocytes: 0.03 10*3/uL (ref 0.00–0.07)
Basophils Absolute: 0 10*3/uL (ref 0.0–0.1)
Basophils Relative: 0 %
Eosinophils Absolute: 0 10*3/uL (ref 0.0–0.5)
Eosinophils Relative: 1 %
HCT: 44.6 % (ref 39.0–52.0)
Hemoglobin: 14.9 g/dL (ref 13.0–17.0)
Immature Granulocytes: 0 %
Lymphocytes Relative: 63 %
Lymphs Abs: 4.4 10*3/uL — ABNORMAL HIGH (ref 0.7–4.0)
MCH: 29.5 pg (ref 26.0–34.0)
MCHC: 33.4 g/dL (ref 30.0–36.0)
MCV: 88.3 fL (ref 80.0–100.0)
Monocytes Absolute: 0.9 10*3/uL (ref 0.1–1.0)
Monocytes Relative: 12 %
Neutro Abs: 1.7 10*3/uL (ref 1.7–7.7)
Neutrophils Relative %: 24 %
Platelets: 124 10*3/uL — ABNORMAL LOW (ref 150–400)
RBC: 5.05 MIL/uL (ref 4.22–5.81)
RDW: 17.3 % — ABNORMAL HIGH (ref 11.5–15.5)
WBC: 7.1 10*3/uL (ref 4.0–10.5)
nRBC: 0 % (ref 0.0–0.2)

## 2021-01-20 LAB — BASIC METABOLIC PANEL
Anion gap: 6 (ref 5–15)
BUN: 19 mg/dL (ref 8–23)
CO2: 26 mmol/L (ref 22–32)
Calcium: 9 mg/dL (ref 8.9–10.3)
Chloride: 109 mmol/L (ref 98–111)
Creatinine, Ser: 1.04 mg/dL (ref 0.61–1.24)
GFR, Estimated: >60 mL/min (ref >60)
Glucose, Bld: 80 mg/dL (ref 70–99)
Potassium: 3.9 mmol/L (ref 3.5–5.1)
Sodium: 141 mmol/L (ref 135–145)

## 2021-01-20 LAB — SEDIMENTATION RATE: Sed Rate: 5 mm/hr (ref 0–16)

## 2021-01-20 LAB — RESP PANEL BY RT-PCR (FLU A&B, COVID) ARPGX2
Influenza A by PCR: NEGATIVE
Influenza B by PCR: NEGATIVE
SARS Coronavirus 2 by RT PCR: NEGATIVE

## 2021-01-20 MED ORDER — ACETAMINOPHEN 325 MG PO TABS
650.0000 mg | ORAL_TABLET | Freq: Four times a day (QID) | ORAL | Status: DC | PRN
  Administered 2021-01-20 – 2021-03-04 (×38): 650 mg via ORAL
  Filled 2021-01-20 (×40): qty 2

## 2021-01-20 MED ORDER — ENOXAPARIN SODIUM 40 MG/0.4ML IJ SOSY
40.0000 mg | PREFILLED_SYRINGE | INTRAMUSCULAR | Status: DC
  Administered 2021-01-20 – 2021-01-23 (×4): 40 mg via SUBCUTANEOUS
  Filled 2021-01-20 (×4): qty 0.4

## 2021-01-20 MED ORDER — HYDROXYZINE HCL 25 MG PO TABS
25.0000 mg | ORAL_TABLET | Freq: Two times a day (BID) | ORAL | Status: DC
  Administered 2021-01-20 – 2021-03-04 (×85): 25 mg via ORAL
  Filled 2021-01-20 (×85): qty 1

## 2021-01-20 MED ORDER — ACETAMINOPHEN 650 MG RE SUPP
650.0000 mg | Freq: Four times a day (QID) | RECTAL | Status: DC | PRN
  Administered 2021-02-06: 650 mg via RECTAL

## 2021-01-20 MED ORDER — METOPROLOL SUCCINATE ER 25 MG PO TB24
25.0000 mg | ORAL_TABLET | Freq: Every day | ORAL | Status: DC
  Administered 2021-01-21 – 2021-03-04 (×42): 25 mg via ORAL
  Filled 2021-01-20 (×43): qty 1

## 2021-01-20 MED ORDER — QUETIAPINE FUMARATE 25 MG PO TABS
50.0000 mg | ORAL_TABLET | Freq: Two times a day (BID) | ORAL | Status: DC
  Administered 2021-01-21 – 2021-03-04 (×84): 50 mg via ORAL
  Filled 2021-01-20 (×85): qty 2

## 2021-01-20 MED ORDER — DILTIAZEM HCL 30 MG PO TABS
60.0000 mg | ORAL_TABLET | Freq: Two times a day (BID) | ORAL | Status: DC
  Administered 2021-01-20 – 2021-01-21 (×2): 60 mg via ORAL
  Filled 2021-01-20 (×4): qty 2

## 2021-01-20 MED ORDER — QUETIAPINE FUMARATE 25 MG PO TABS: 50.0000 mg | ORAL_TABLET | Freq: Two times a day (BID) | ORAL | Status: DC

## 2021-01-20 MED ORDER — QUETIAPINE FUMARATE 100 MG PO TABS: 200.0000 mg | ORAL_TABLET | Freq: Every day | ORAL | Status: DC

## 2021-01-20 MED ORDER — GADOBUTROL 1 MMOL/ML IV SOLN
10.0000 mL | Freq: Once | INTRAVENOUS | Status: AC | PRN
  Administered 2021-01-20: 10 mL via INTRAVENOUS

## 2021-01-20 MED ORDER — VANCOMYCIN HCL 1500 MG/300ML IV SOLN
1500.0000 mg | Freq: Two times a day (BID) | INTRAVENOUS | Status: DC
  Administered 2021-01-20 – 2021-01-22 (×4): 1500 mg via INTRAVENOUS
  Filled 2021-01-20 (×4): qty 300

## 2021-01-20 MED ORDER — QUETIAPINE FUMARATE 100 MG PO TABS
200.0000 mg | ORAL_TABLET | Freq: Every day | ORAL | Status: DC
  Administered 2021-01-20 – 2021-03-03 (×43): 200 mg via ORAL
  Filled 2021-01-20 (×44): qty 2

## 2021-01-20 MED ORDER — VANCOMYCIN HCL 2000 MG/400ML IV SOLN
2000.0000 mg | Freq: Once | INTRAVENOUS | Status: AC
  Administered 2021-01-20: 2000 mg via INTRAVENOUS
  Filled 2021-01-20: qty 400

## 2021-01-20 MED ORDER — SODIUM CHLORIDE 0.9 % IV SOLN
2.0000 g | Freq: Three times a day (TID) | INTRAVENOUS | Status: DC
  Administered 2021-01-20 – 2021-01-21 (×2): 2 g via INTRAVENOUS
  Filled 2021-01-20 (×2): qty 2

## 2021-01-20 NOTE — Consult Note (Signed)
Reason for Consult: Cellulitis of left second toe Referring Physician: Dr. Arbutus Leas  [name] [name] is an 60 y.o. male.  HPI: Patient is a 60 year old black male with multiple medical problems including seizure disorders, psychosis, chronic hepatitis C, atrial flutter, alcohol abuse, and cirrhosis who was referred to the ER from a nursing facility as he was getting his toenails taken care of and swelling of his left second toe was identified.  Patient is a poor historian.  Past Medical History:  Diagnosis Date   Aneurysm (HCC)    Atrial fibrillation with RVR (HCC) 03/03/2017   Dementia (HCC)    ETOHism (HCC)    Gait abnormality 05/26/2016   Hepatitis C    Hereditary and idiopathic peripheral neuropathy 06/03/2014   Hypertension    Posttraumatic encephalopathy 08/20/2018   Seizure [facility])     Past Surgical History:  Procedure Laterality Date   A-FLUTTER ABLATION N/A 03/03/2017   Procedure: A-FLUTTER ABLATION;  Surgeon: Duke Salvia, MD;  Location: [facility] INVASIVE CV LAB;  Service: Cardiovascular;  Laterality: N/A;   ATRIAL FLUTTER ABLATION  03/03/2017   CEREBRAL ANEURYSM REPAIR     At [facility]   HARDWARE REMOVAL Left 10/29/2012   Procedure: HARDWARE REMOVAL;  Surgeon: Sheral Apley, MD;  Location: [facility] OR;  Service: Orthopedics;  Laterality: Left;   I & D EXTREMITY Left 10/26/2012   Procedure: IRRIGATION AND DEBRIDEMENT Left Elbow;  Surgeon: Sheral Apley, MD;  Location: [facility] OR;  Service: Orthopedics;  Laterality: Left;   I & D EXTREMITY Left 10/29/2012   Procedure: IRRIGATION AND DEBRIDEMENT EXTREMITY, wound vac change, stimulan beads;  Surgeon: Sheral Apley, MD;  Location: [facility] OR;  Service: Orthopedics;  Laterality: Left;  lateral on bean bag   I & D EXTREMITY Left 11/02/2012   Procedure: IRRIGATION AND DEBRIDEMENT EXTREMITY;  Surgeon: Sheral Apley, MD;  Location: [facility] OR;  Service: Orthopedics;  Laterality: Left;   INCISION AND DRAINAGE ABSCESS Left 11/02/2012   Procedure:  INCISION AND DRAINAGE ABSCESS;  Surgeon: Sheral Apley, MD;  Location: [facility] OR;  Service: Orthopedics;  Laterality: Left;    Family History  Problem Relation Age of Onset   Hypertension Mother    Cancer Mother    Hypertension Father    Diabetes Sister    Cancer Sister    Cancer Sister     Social History:  reports that he has been smoking cigarettes. He has been smoking an average of .25 packs per day. He has never used smokeless tobacco. He reports that he does not drink alcohol and does not use drugs.  Allergies:  Allergies  Allergen Reactions   Carbamazepine Other (See Comments)    Hyponatremia   Depakote [Divalproex Sodium] Other (See Comments)    Unknown - on MAR    Medications: Prior to Admission: (Not in a hospital admission)   Results for orders placed or performed during the hospital encounter of 01/20/21 (from the past 48 hour(s))  Basic metabolic panel     Status: None   Collection Time: 01/20/21 12:28 PM  Result Value Ref Range   Sodium 141 135 - 145 mmol/L   Potassium 3.9 3.5 - 5.1 mmol/L   Chloride 109 98 - 111 mmol/L   CO2 26 22 - 32 mmol/L   Glucose, Bld 80 70 - 99 mg/dL    Comment: Glucose reference range applies only to samples taken after fasting for at least 8 hours.   BUN 19 8 - 23 mg/dL  Creatinine, Ser 1.04 0.61 - 1.24 mg/dL   Calcium 9.0 8.9 - 10.3 mg/dL   GFR, Estimated >60 >60 mL/min    Comment: (NOTE) Calculated using the CKD-EPI Creatinine Equation (2021)    Anion gap 6 5 - 15    Comment: Performed at [facility], [address] [place], [place] [address]  CBC with Differential     Status: Abnormal   Collection Time: 01/20/21 12:28 PM  Result Value Ref Range   WBC 7.1 4.0 - 10.5 K/uL   RBC 5.05 4.22 - 5.81 MIL/uL   Hemoglobin 14.9 13.0 - 17.0 g/dL   HCT 44.6 39.0 - 52.0 %   MCV 88.3 80.0 - 100.0 fL   MCH 29.5 26.0 - 34.0 pg   MCHC 33.4 30.0 - 36.0 g/dL   RDW 17.3 (H) 11.5 - 15.5 %   Platelets 124 (L) 150 - 400 K/uL   nRBC 0.0 0.0  - 0.2 %   Neutrophils Relative % 24 %   Neutro Abs 1.7 1.7 - 7.7 K/uL   Lymphocytes Relative 63 %   Lymphs Abs 4.4 (H) 0.7 - 4.0 K/uL   Monocytes Relative 12 %   Monocytes Absolute 0.9 0.1 - 1.0 K/uL   Eosinophils Relative 1 %   Eosinophils Absolute 0.0 0.0 - 0.5 K/uL   Basophils Relative 0 %   Basophils Absolute 0.0 0.0 - 0.1 K/uL   Immature Granulocytes 0 %   Abs Immature Granulocytes 0.03 0.00 - 0.07 K/uL    Comment: Performed at [facility], [address] [place], [place] [address]    DG Foot Complete Left  Result Date: 01/20/2021 CLINICAL DATA:  Second left toe wound EXAM: LEFT FOOT - COMPLETE 3+ VIEW COMPARISON:  None. FINDINGS: Soft tissue swelling of the distal second digit with erosion of the underlying middle and distal phalanx consistent with osteomyelitis. Erosive changes also noted in the distal tip of the distal phalanx of the great toe with overlying soft tissue abnormality suspicious for chronic osteomyelitis. Atherosclerotic changes seen throughout visualized arterial segments. IMPRESSION: Findings suspicious for osteomyelitis of the tip of the second and first toes. Electronically Signed   By: Miachel Roux M.D.   On: 01/20/2021 11:47    ROS:  Review of systems not obtained due to patient factors.  Blood pressure 103/62, pulse (!) 52, temperature 98.2 F (36.8 C), temperature source Oral, resp. rate 18, height 6\' 4"  (1.93 m), weight 112.5 kg, SpO2 100 %. Physical Exam: BM who appears in no acute distress. Extremities:  Dry scaly skin noted in both lower extremities.  Easily palpable DP pulses noted.  Left foot warmer than right.  Multiple irregular nails noted.  Left second toe with swelling and erythema noted.  Some erosion of the nail noted on second toe.    Assessment/Plan: Cellulitis of left second toe.  ?osteomyelitis Plan:  Agree with admission for workup and IV antibiotics.  Further management pending those results.  Aviva Signs 01/20/2021, 3:44 PM

## 2021-01-20 NOTE — Progress Notes (Addendum)
Patient is alert but confused.  Patient answers questions, but quickly forgets he is in hospital and is trying to find his clothing.  Explained to patient that belongings were at [facility] rest home. During this period, patient was adament about going downstairs back to emergency room to find belongings and was becoming aggressive towards staff.  Patient was deescalated when explained that his belongings were at [facility] rest home and safe in his closet at the facility.

## 2021-01-20 NOTE — ED Notes (Signed)
Pt transported to MRI 

## 2021-01-20 NOTE — Progress Notes (Addendum)
Pharmacy Antibiotic Note  [name] [name] is a 60 y.o. male admitted on 01/20/2021 with cellulitis.  Pharmacy has been consulted for Vancomycin and cefepimedosing.  Plan: Vancomycin 2000mg  IV loading dose, then 1500mg  IV q12h for AUC of 462 ( gaol AUC 400-550) Cefepime 2gm IV q8h F/u cultures and clinical progress Monitor V/S, labs and levels as indicated  Height: 6\' 4"  (193 cm) Weight: 112.5 kg (248 lb) IBW/kg (Calculated) : 86.8  Temp (24hrs), Avg:98.2 F (36.8 C), Min:98.2 F (36.8 C), Max:98.2 F (36.8 C)  No results for input(s): WBC, CREATININE, LATICACIDVEN, VANCOTROUGH, VANCOPEAK, VANCORANDOM, GENTTROUGH, GENTPEAK, GENTRANDOM, TOBRATROUGH, TOBRAPEAK, TOBRARND, AMIKACINPEAK, AMIKACINTROU, AMIKACIN in the last 168 hours.  Estimated Creatinine Clearance: 87.7 mL/min (by C-G formula based on SCr of 1.2 mg/dL).    Allergies  Allergen Reactions   Carbamazepine Other (See Comments)    Hyponatremia   Depakote [Divalproex Sodium] Other (See Comments)    Unknown - on MAR    Antimicrobials this admission: vancomycin 11/2 >>  Cefepime 11/2>>  No cultures  Thank you for allowing pharmacy to be a part of this patient's care.  , BS , 13/2 Clinical Pharmacist Pager [phone]  01/20/2021 12:27 PM

## 2021-01-20 NOTE — ED Notes (Signed)
Patient has been very agitated.  Taken all the leads from monitor off. Nurse has finally gotten patient settled and willing to stay in his room. Unable to obtain vital signs at this time.

## 2021-01-20 NOTE — H&P (Signed)
History and Physical  [name] [name] YME:[id number] DOB: 03-20-1961 DOA: 01/20/2021   PCP: [name], NP   Patient coming from: Home  Chief Complaint: toe infection  HPI:  [name] [name] is a 60 y.o. male with medical history of MDD with psychosis, seizure disorder, hypertension, tobacco abuse, cognitive impairment, atrial flutter with RVR, chronic hepatitis C, history of cocaine and alcohol abuse presenting due to abnormality with his left second toe.  The patient is a poor historian.  When asked why he is in the emergency department, he states " they tell me there is something wrong with my foot".  Apparently, the patient was getting his toenails trimmed at his assisted living facility [facility]) when it was noticed there was some abnormality and discoloration of his left second toe.  As result, the patient was sent to the emergency department for further evaluation.  The patient himself denies any pain or discomfort about his second toe.  He is unable to provide any history regarding recent injury or trauma.  He states, " I did not there was something wrong with my toe."  He denies any fevers, chills, chest pain, shortness breath, cough, hemoptysis, nausea, vomiting, diarrhea, domino pain, foot pain.  He continues to smoke tobacco.  ED In the emergency department, the patient was afebrile hemodynamically stable with oxygen saturation 100% room air.  BMP showed a sodium 141, potassium 3.9, serum creatinine 1.04.  WBC 7.1, hemoglobin 14.9, platelets 124,000.  Patient was given a dose of vancomycin.  X-ray of the left foot showed soft tissue swelling with erosion of the distal and middle phalanx of the left second toe.  Admission was requested for further work-up and treatment.  Assessment/Plan: Cellulitis left second toe/osteomyelitis -ESR -CRP -Check ABIs -Patient has palpable dorsalis pedis pulses bilateral -Surgery consult -Continue vancomycin -Add cefepime  Bipolar  disorder -Continue quetiapine, sertraline, trazodone -Continue hydroxyzine  Atrial flutter -Continue apixaban -Continue metoprolol succinate -Continue diltiazem  Seizure disorder -Continue lamotrigine, Keppra, Vimpat  Tobacco abuse -Tobacco cessation discussed  Thrombocytopenia -Likely secondary to chronic hepatitis C -Monitor for signs of bleeding  Chronic hepatitis C -Appears clinically compensated      Past Medical History:  Diagnosis Date   Aneurysm ([place])    Atrial fibrillation with RVR ([place]) 03/03/2017   Dementia ([place])    ETOHism ([place])    Gait abnormality 05/26/2016   Hepatitis C    Hereditary and idiopathic peripheral neuropathy 06/03/2014   Hypertension    Posttraumatic encephalopathy 08/20/2018   Seizure ([place])    Past Surgical History:  Procedure Laterality Date   A-FLUTTER ABLATION N/A 03/03/2017   Procedure: A-FLUTTER ABLATION;  Surgeon: Deboraha Sprang, MD;  Location: [place] [facility];  Service: Cardiovascular;  Laterality: N/A;   ATRIAL FLUTTER ABLATION  03/03/2017   CEREBRAL ANEURYSM REPAIR     At [place] REMOVAL Left 10/29/2012   Procedure: HARDWARE REMOVAL;  Surgeon: Renette Butters, MD;  Location: [place];  Service: Orthopedics;  Laterality: Left;   I & D EXTREMITY Left 10/26/2012   Procedure: IRRIGATION AND DEBRIDEMENT Left Elbow;  Surgeon: Renette Butters, MD;  Location: [place];  Service: Orthopedics;  Laterality: Left;   I & D EXTREMITY Left 10/29/2012   Procedure: IRRIGATION AND DEBRIDEMENT EXTREMITY, wound vac change, stimulan beads;  Surgeon: Renette Butters, MD;  Location: [place];  Service: Orthopedics;  Laterality: Left;  lateral on bean bag   I & D EXTREMITY Left 11/02/2012  Procedure: IRRIGATION AND DEBRIDEMENT EXTREMITY;  Surgeon: Renette Butters, MD;  Location: [place];  Service: Orthopedics;  Laterality: Left;   INCISION AND DRAINAGE ABSCESS Left 11/02/2012   Procedure: INCISION AND DRAINAGE ABSCESS;  Surgeon: Renette Butters, MD;  Location: [place];  Service: Orthopedics;  Laterality: Left;   Social History:  reports that he has been smoking cigarettes. He has been smoking an average of .25 packs per day. He has never used smokeless tobacco. He reports that he does not drink alcohol and does not use drugs.   Family History  Problem Relation Age of Onset   Hypertension Mother    Cancer Mother    Hypertension Father    Diabetes Sister    Cancer Sister    Cancer Sister      Allergies  Allergen Reactions   Carbamazepine Other (See Comments)    Hyponatremia   Depakote [Divalproex Sodium] Other (See Comments)    Unknown - on MAR     Prior to Admission medications   Medication Sig Start Date End Date Taking? Authorizing Provider  apixaban (ELIQUIS) 5 MG TABS tablet Take 5 mg by mouth 2 (two) times daily.    [provider]  cholecalciferol (VITAMIN D) 1000 units tablet Take 1,000 Units by mouth daily.    [provider]  diltiazem (CARDIZEM) 90 MG tablet Take 90 mg by mouth 2 (two) times daily.    [provider]  hydrOXYzine (VISTARIL) 25 MG capsule Take 25 mg by mouth in the morning and at bedtime.    [provider]  hydrOXYzine (VISTARIL) 50 MG capsule Take 100 mg by mouth daily as needed (agitation).    [provider]  Lacosamide (VIMPAT) 150 MG TABS Take 1 tablet (150 mg total) by mouth 2 (two) times daily. 12/27/18   Kathrynn Ducking, MD  lamoTRIgine (LAMICTAL) 25 MG tablet Take 25 mg by mouth daily.    [provider]  Latanoprost 0.005 % EMUL Place 1 drop into both eyes at bedtime.    [provider]  levETIRAcetam (KEPPRA) 750 MG tablet Take 2 tablets (1,500 mg total) by mouth 2 (two) times daily. 12/27/18   Kathrynn Ducking, MD  loperamide (IMODIUM A-D) 2 MG tablet Take 2-4 mg by mouth See admin instructions. 11m after 1st loose stool then 270mevery 6 hours as needed for diarrhea    [provider]  metoprolol succinate  (TOPROL-XL) 25 MG 24 hr tablet Take 25 mg by mouth daily.    [provider]  ondansetron (ZOFRAN) 4 MG tablet Take 4 mg by mouth every 8 (eight) hours as needed for nausea.    [provider]  QUEtiapine (SEROQUEL) 200 MG tablet Take 200 mg by mouth at bedtime.    [provider]  QUEtiapine (SEROQUEL) 50 MG tablet Take 50 mg by mouth 2 (two) times daily.    [provider]  sertraline (ZOLOFT) 50 MG tablet Take 50 mg by mouth daily.    [provider]  SUMAtriptan (IMITREX) 50 MG tablet Take 1 tablet (50 mg total) by mouth See admin instructions. Give 1 tablet (50 mg) by mouth every 24 hours as needed for migraine headache;  May repeat in 2 hours if headache persists or recurs. Do not exceed 100 mg in 24 hours Patient taking differently: Take 50 mg by mouth daily as needed for migraine. 06/26/17   GiKinnie FeilPA-C  traZODone (DESYREL) 150 MG tablet Take 150 mg  by mouth at bedtime.    [provider]    Review of Systems:  Constitutional:  No weight loss, night sweats, Fevers, chills, fatigue.  Head&Eyes: No headache.  No vision loss. ENT:  No Difficulty swallowing,Tooth/dental problems,Sore throat,   Cardio-vascular:  No chest pain, Orthopnea, PND, swelling in lower extremities,  dizziness, palpitations  GI:  No  abdominal pain, nausea, vomiting, diarrhea, loss of appetite,  Resp:  No shortness of breath with exertion or at rest. No cough. No coughing up of blood .No wheezing.No chest wall deformity  Skin:  no rash or lesions.  GU:  no dysuria, change in color of urine, no urgency or frequency. No flank pain.  Musculoskeletal:  No joint pain or swelling. No decreased range of motion. No back pain.  Psych:  No change in mood or affect. No depression or anxiety. Neurologic: No headache, no dysesthesia, no focal weakness, no vision loss.   Physical Exam: Vitals:   01/20/21 1219 01/20/21 1221 01/20/21 1238 01/20/21 1445   BP:   116/63 103/62  Pulse:   (!) 56 (!) 52  Resp:   20 18  Temp:      TempSrc:      SpO2:   96% 100%  Weight: 112.5 kg     Height:  _0  (1.93 m)     General:  A&O x 3, NAD, nontoxic, pleasant/cooperative Head/Eye: No conjunctival hemorrhage, no icterus, [place]/AT, No nystagmus ENT:  No icterus,  No thrush, good dentition, no pharyngeal exudate Neck:  No masses, no lymphadenpathy, no bruits CV:  RRR, no rub, no gallop, no S3 Lung:  CTAB, good air movement, no wheeze, no rhonchi Abdomen: soft/NT, +BS, nondistended, no peritoneal signs Ext: mild edema and discoloration of left second toe without pus or crepitance. Neuro: CNII-XII intact, strength 4/5 in bilateral upper and lower extremities, no dysmetria  Labs on Admission:  Basic Metabolic Panel: Recent Labs  Lab 01/20/21 1228  NA 141  K 3.9  CL 109  CO2 26  GLUCOSE 80  BUN 19  CREATININE 1.04  CALCIUM 9.0   Liver Function Tests: No results for input(s): AST, ALT, ALKPHOS, BILITOT, PROT, ALBUMIN in the last 168 hours. No results for input(s): LIPASE, AMYLASE in the last 168 hours. No results for input(s): AMMONIA in the last 168 hours. CBC: Recent Labs  Lab 01/20/21 1228  WBC 7.1  NEUTROABS 1.7  HGB 14.9  HCT 44.6  MCV 88.3  PLT 124*   Coagulation Profile: No results for input(s): INR, PROTIME in the last 168 hours. Cardiac Enzymes: No results for input(s): CKTOTAL, CKMB, CKMBINDEX, TROPONINI in the last 168 hours. BNP: Invalid input(s): POCBNP CBG: No results for input(s): GLUCAP in the last 168 hours. Urine analysis:    Component Value Date/Time   COLORURINE YELLOW 12/12/2017 0931   APPEARANCEUR CLEAR 12/12/2017 0931   LABSPEC 1.023 12/12/2017 0931   PHURINE 6.0 12/12/2017 0931   GLUCOSEU NEGATIVE 12/12/2017 0931   HGBUR NEGATIVE 12/12/2017 0931   BILIRUBINUR NEGATIVE 12/12/2017 0931   BILIRUBINUR Small 10/19/2015 1526   KETONESUR NEGATIVE 12/12/2017 0931   PROTEINUR NEGATIVE 12/12/2017 0931    UROBILINOGEN >=8.0 10/19/2015 1526   UROBILINOGEN 0.2 10/31/2014 1840   NITRITE NEGATIVE 12/12/2017 0931   LEUKOCYTESUR NEGATIVE 12/12/2017 0931   Sepsis Labs: _1 (procalcitonin:4,lacticidven:4) )No results found for this or any previous visit (from the past 240 hour(s)).   Radiological Exams on Admission: DG Foot Complete Left  Result Date: 01/20/2021 CLINICAL DATA:  Second left  toe wound EXAM: LEFT FOOT - COMPLETE 3+ VIEW COMPARISON:  None. FINDINGS: Soft tissue swelling of the distal second digit with erosion of the underlying middle and distal phalanx consistent with osteomyelitis. Erosive changes also noted in the distal tip of the distal phalanx of the great toe with overlying soft tissue abnormality suspicious for chronic osteomyelitis. Atherosclerotic changes seen throughout visualized arterial segments. IMPRESSION: Findings suspicious for osteomyelitis of the tip of the second and first toes. Electronically Signed   By: Miachel Roux M.D.   On: 01/20/2021 11:47       Time spent:60 minutes Code Status:   FULL Family Communication:  No Family at bedside Disposition Plan: expect 1-2 day hospitalization Consults called: general surgery DVT Prophylaxis: apixaban  Orson Eva, DO  [facility] Pager [phone]  If 7PM-7AM, please contact night-coverage [email or web address] Password [email or web address] 01/20/2021, 3:04 PM

## 2021-01-20 NOTE — ED Triage Notes (Signed)
Pt from [facility] from [facility]. Pt states he got his toenails cut and now has dried blood on the left middle toe. Pt denies pain.

## 2021-01-20 NOTE — ED Provider Notes (Signed)
[name] EMERGENCY DEPARTMENT Provider Note   CSN: [id number] Arrival date & time: 01/20/21  1047     History Chief Complaint  Patient presents with   Toe Pain    [name] [name] is a 60 y.o. male.  The history is provided by the patient. No language interpreter was used.  Toe Pain   60 year old male significant history of atrial fibrillation currently on Eliquis, alcohol abuse, hepatitis C, bipolar, substance abuse sent here from an assisted living facility via EMS for concerns of toe injury.  Patient states he had his toenails trimmed at his living facility earlier today but they were concerned of the second toe and recommend patient to come to the ER for evaluation.  Patient states "there is nothing wrong with my toe" he denies any pain.  And denies any recent injury.  He does not know why he has been sent here and he prefers to return back to his facility.  Patient states "I know my toes are ugly but it does not bother me"  Past Medical History:  Diagnosis Date   Aneurysm ([place])    Atrial fibrillation with RVR ([place]) 03/03/2017   Dementia ([place])    ETOHism ([place])    Gait abnormality 05/26/2016   Hepatitis C    Hereditary and idiopathic peripheral neuropathy 06/03/2014   Hypertension    Posttraumatic encephalopathy 08/20/2018   Seizure ([place])     Patient Active Problem List   Diagnosis Date Noted   Posttraumatic encephalopathy 08/20/2018   H/O prior ablation treatment 05/31/2018   Cocaine abuse with cocaine-induced mood disorder ([place]) 10/26/2017   S/P ablation of atrial fibrillation 07/03/2017   Insomnia 05/07/2017   Atrial flutter ([place]) 03/03/2017   Late effect of cerebrovascular accident (CVA) 01/21/2017   Bipolar disorder ([place]) 01/21/2017   Migraine headache 01/21/2017   Atrial flutter with rapid ventricular response ([place]) 01/10/2017   Syncope 01/10/2017   Gait abnormality 05/26/2016   Smoking 01/05/2015   Chronic pain syndrome 01/05/2015   Non-healing  non-surgical wound 12/12/2014   Fall    Paranoia [facility])    Dementia with behavioral disturbance    Encephalopathy, hepatic 10/09/2014   Hereditary and idiopathic peripheral neuropathy 06/03/2014   Seizures ([place]) 01/24/2014   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/23/2014   Fracture of olecranon process of ulna 11/08/2012   Osteomyelitis ([place]) 11/08/2012   Essential hypertension, benign 10/31/2012   History of alcohol abuse 10/26/2012   Chronic hepatitis C with hepatic coma ([place]) 10/26/2012    Past Surgical History:  Procedure Laterality Date   A-FLUTTER ABLATION N/A 03/03/2017   Procedure: A-FLUTTER ABLATION;  Surgeon: Deboraha Sprang, MD;  Location: [place] [facility];  Service: Cardiovascular;  Laterality: N/A;   ATRIAL FLUTTER ABLATION  03/03/2017   CEREBRAL ANEURYSM REPAIR     At [place] REMOVAL Left 10/29/2012   Procedure: HARDWARE REMOVAL;  Surgeon: Renette Butters, MD;  Location: [place];  Service: Orthopedics;  Laterality: Left;   I & D EXTREMITY Left 10/26/2012   Procedure: IRRIGATION AND DEBRIDEMENT Left Elbow;  Surgeon: Renette Butters, MD;  Location: [place];  Service: Orthopedics;  Laterality: Left;   I & D EXTREMITY Left 10/29/2012   Procedure: IRRIGATION AND DEBRIDEMENT EXTREMITY, wound vac change, stimulan beads;  Surgeon: Renette Butters, MD;  Location: [place];  Service: Orthopedics;  Laterality: Left;  lateral on bean bag   I & D EXTREMITY Left 11/02/2012   Procedure: IRRIGATION AND DEBRIDEMENT EXTREMITY;  Surgeon: Sheral Apley, MD;  Location: [facility] OR;  Service: Orthopedics;  Laterality: Left;   INCISION AND DRAINAGE ABSCESS Left 11/02/2012   Procedure: INCISION AND DRAINAGE ABSCESS;  Surgeon: Sheral Apley, MD;  Location: [facility] OR;  Service: Orthopedics;  Laterality: Left;       Family History  Problem Relation Age of Onset   Hypertension Mother    Cancer Mother    Hypertension Father    Diabetes Sister    Cancer Sister    Cancer Sister     Social  History   Tobacco Use   Smoking status: Some Days    Packs/day: 0.25    Types: Cigarettes   Smokeless tobacco: Never  Substance Use Topics   Alcohol use: No    Alcohol/week: 0.0 standard drinks    Comment: quit drinking 11/2011 per patient   Drug use: No    Home Medications Prior to Admission medications   Medication Sig Start Date End Date Taking? Authorizing Provider  apixaban (ELIQUIS) 5 MG TABS tablet Take 5 mg by mouth 2 (two) times daily.    [provider]  cholecalciferol (VITAMIN D) 1000 units tablet Take 1,000 Units by mouth daily.    [provider]  diltiazem (CARDIZEM) 90 MG tablet Take 90 mg by mouth 2 (two) times daily.    [provider]  hydrOXYzine (VISTARIL) 25 MG capsule Take 25 mg by mouth in the morning and at bedtime.    [provider]  hydrOXYzine (VISTARIL) 50 MG capsule Take 100 mg by mouth daily as needed (agitation).    [provider]  Lacosamide (VIMPAT) 150 MG TABS Take 1 tablet (150 mg total) by mouth 2 (two) times daily. 12/27/18   York Spaniel, MD  lamoTRIgine (LAMICTAL) 25 MG tablet Take 25 mg by mouth daily.    [provider]  Latanoprost 0.005 % EMUL Place 1 drop into both eyes at bedtime.    [provider]  levETIRAcetam (KEPPRA) 750 MG tablet Take 2 tablets (1,500 mg total) by mouth 2 (two) times daily. 12/27/18   York Spaniel, MD  loperamide (IMODIUM A-D) 2 MG tablet Take 2-4 mg by mouth See admin instructions. 4mg  after 1st loose stool then 2mg  every 6 hours as needed for diarrhea    [provider]  metoprolol succinate (TOPROL-XL) 25 MG 24 hr tablet Take 25 mg by mouth daily.    [provider]  ondansetron (ZOFRAN) 4 MG tablet Take 4 mg by mouth every 8 (eight) hours as needed for nausea.    [provider]  QUEtiapine (SEROQUEL) 200 MG tablet Take 200 mg by mouth at bedtime.    [provider]  QUEtiapine (SEROQUEL) 50 MG tablet Take  50 mg by mouth 2 (two) times daily.    [provider]  sertraline (ZOLOFT) 50 MG tablet Take 50 mg by mouth daily.    [provider]  SUMAtriptan (IMITREX) 50 MG tablet Take 1 tablet (50 mg total) by mouth See admin instructions. Give 1 tablet (50 mg) by mouth every 24 hours as needed for migraine headache;  May repeat in 2 hours if headache persists or recurs. Do not exceed 100 mg in 24 hours Patient taking differently: Take 50 mg by mouth daily as needed for migraine. 06/26/17   , PA-C  traZODone (DESYREL) 150 MG tablet Take 150 mg by mouth at bedtime.    [provider]    Allergies  Carbamazepine and Depakote [divalproex sodium]  Review of Systems   Review of Systems  Constitutional:  Negative for fever.  Neurological:  Negative for numbness.   Physical Exam Updated Vital Signs BP 113/72 (BP Location: Left Arm)   Pulse 73   Temp 98.2 F (36.8 C) (Oral)   Resp 17   SpO2 100%   Physical Exam Vitals and nursing note reviewed.  Constitutional:      General: He is not in acute distress.    Appearance: He is well-developed.  HENT:     Head: Atraumatic.  Eyes:     Conjunctiva/sclera: Conjunctivae normal.  Musculoskeletal:     Cervical back: Neck supple.  Skin:    Findings: No rash.     Comments: Left foot: Evidence of significant onychomycosis involving multiple toenails.  Second toe does appear hyperpigmented with intact sensation and no obvious signs of infection noted.  Recent trimmed toenails  Neurological:     Mental Status: He is alert.    ED Results / Procedures / Treatments   Labs (all labs ordered are listed, but only abnormal results are displayed) Labs Reviewed  CBC WITH DIFFERENTIAL/PLATELET - Abnormal; Notable for the following components:      Result Value   RDW 17.3 (*)    Platelets 124 (*)    Lymphs Abs 4.4 (*)    All other components within normal limits  RESP PANEL BY RT-PCR (FLU A&B, COVID) [id number]   BASIC METABOLIC PANEL    EKG None  Radiology DG Foot Complete Left  Result Date: 01/20/2021 CLINICAL DATA:  Second left toe wound EXAM: LEFT FOOT - COMPLETE 3+ VIEW COMPARISON:  None. FINDINGS: Soft tissue swelling of the distal second digit with erosion of the underlying middle and distal phalanx consistent with osteomyelitis. Erosive changes also noted in the distal tip of the distal phalanx of the great toe with overlying soft tissue abnormality suspicious for chronic osteomyelitis. Atherosclerotic changes seen throughout visualized arterial segments. IMPRESSION: Findings suspicious for osteomyelitis of the tip of the second and first toes. Electronically Signed   By: Miachel Roux M.D.   On: 01/20/2021 11:47    Procedures Procedures   Medications Ordered in ED Medications  vancomycin (VANCOREADY) IVPB 2000 mg/400 mL (0 mg Intravenous Stopped 01/20/21 1450)    Followed by  vancomycin (VANCOREADY) IVPB 1500 mg/300 mL (has no administration in time range)    ED Course  I have reviewed the triage vital signs and the nursing notes.  Pertinent labs & imaging results that were available during my care of the patient were reviewed by me and considered in my medical decision making (see chart for details).    MDM Rules/Calculators/A&P                           BP 103/62   Pulse (!) 52   Temp 98.2 F (36.8 C) (Oral)   Resp 18   Ht 6\' 4"  (1.93 m)   Wt 112.5 kg   SpO2 100%   BMI 30.19 kg/m   Final Clinical Impression(s) / ED Diagnoses Final diagnoses:  Osteomyelitis of great toe of left foot (HCC)  Osteomyelitis of second toe of left foot ([place])    Rx / DC Orders ED Discharge Orders     None      Patient had his toenails trimmed at his nursing facility today.  One of the staff voiced concern about his left second toe as it appears  discolored.  Patient denies any significant pain or injury to the affected toe.  I do appreciate hyperpigmented skin changes to the second toe  and difficult to fully assess for cap refill, and patient does have significant evidence of onychomycosis involving his toenails.  X-ray ordered to assess for potential osteomyelitis however my suspicion is low.  12:00 PM X-ray of the left foot with findings suspicious for osteomyelitis of the tip of the second and first toes.  We will check basic labs and will initiate IV antibiotic, vancomycin  1:41 PM Plan to consult general surgery in regards to evidence of osteomyelitis and will also consult medicine for admission.  2:03 PM Appreciate consultation from general surgeon Dr. Arnoldo Morale who was made aware of patient's osteomyelitis.  He will be involved in patient care.  Will consult medicine for admission.  2:50 PM Appreciate consultation from [facility] Dr. Carles Collet who agrees to see and will admit pt.  Pt is made aware of plan and agrees.     Domenic Moras, PA-C 01/20/21 1451    Milton Ferguson, MD 01/22/21 [phone]

## 2021-01-21 ENCOUNTER — Observation Stay (HOSPITAL_COMMUNITY): Payer: Medicaid Other

## 2021-01-21 ENCOUNTER — Observation Stay: Payer: Self-pay

## 2021-01-21 DIAGNOSIS — F319 Bipolar disorder, unspecified: Secondary | ICD-10-CM | POA: Diagnosis present

## 2021-01-21 DIAGNOSIS — F17200 Nicotine dependence, unspecified, uncomplicated: Secondary | ICD-10-CM | POA: Diagnosis not present

## 2021-01-21 DIAGNOSIS — I10 Essential (primary) hypertension: Secondary | ICD-10-CM | POA: Diagnosis present

## 2021-01-21 DIAGNOSIS — Z7901 Long term (current) use of anticoagulants: Secondary | ICD-10-CM | POA: Diagnosis not present

## 2021-01-21 DIAGNOSIS — K7460 Unspecified cirrhosis of liver: Secondary | ICD-10-CM | POA: Diagnosis present

## 2021-01-21 DIAGNOSIS — F3161 Bipolar disorder, current episode mixed, mild: Secondary | ICD-10-CM | POA: Diagnosis not present

## 2021-01-21 DIAGNOSIS — F310 Bipolar disorder, current episode hypomanic: Secondary | ICD-10-CM | POA: Diagnosis not present

## 2021-01-21 DIAGNOSIS — L03032 Cellulitis of left toe: Secondary | ICD-10-CM | POA: Diagnosis present

## 2021-01-21 DIAGNOSIS — I739 Peripheral vascular disease, unspecified: Secondary | ICD-10-CM | POA: Diagnosis present

## 2021-01-21 DIAGNOSIS — F1010 Alcohol abuse, uncomplicated: Secondary | ICD-10-CM | POA: Diagnosis present

## 2021-01-21 DIAGNOSIS — M869 Osteomyelitis, unspecified: Secondary | ICD-10-CM | POA: Diagnosis not present

## 2021-01-21 DIAGNOSIS — M86172 Other acute osteomyelitis, left ankle and foot: Secondary | ICD-10-CM | POA: Diagnosis present

## 2021-01-21 DIAGNOSIS — G40909 Epilepsy, unspecified, not intractable, without status epilepticus: Secondary | ICD-10-CM | POA: Diagnosis present

## 2021-01-21 DIAGNOSIS — Z8249 Family history of ischemic heart disease and other diseases of the circulatory system: Secondary | ICD-10-CM | POA: Diagnosis not present

## 2021-01-21 DIAGNOSIS — Z79899 Other long term (current) drug therapy: Secondary | ICD-10-CM | POA: Diagnosis not present

## 2021-01-21 DIAGNOSIS — F17210 Nicotine dependence, cigarettes, uncomplicated: Secondary | ICD-10-CM | POA: Diagnosis present

## 2021-01-21 DIAGNOSIS — R001 Bradycardia, unspecified: Secondary | ICD-10-CM | POA: Diagnosis present

## 2021-01-21 DIAGNOSIS — B351 Tinea unguium: Secondary | ICD-10-CM | POA: Diagnosis present

## 2021-01-21 DIAGNOSIS — B182 Chronic viral hepatitis C: Secondary | ICD-10-CM | POA: Diagnosis present

## 2021-01-21 DIAGNOSIS — Z20822 Contact with and (suspected) exposure to covid-19: Secondary | ICD-10-CM | POA: Diagnosis present

## 2021-01-21 DIAGNOSIS — I4892 Unspecified atrial flutter: Secondary | ICD-10-CM | POA: Diagnosis present

## 2021-01-21 DIAGNOSIS — D6959 Other secondary thrombocytopenia: Secondary | ICD-10-CM | POA: Diagnosis present

## 2021-01-21 DIAGNOSIS — Z833 Family history of diabetes mellitus: Secondary | ICD-10-CM | POA: Diagnosis not present

## 2021-01-21 DIAGNOSIS — Z888 Allergy status to other drugs, medicaments and biological substances status: Secondary | ICD-10-CM | POA: Diagnosis not present

## 2021-01-21 DIAGNOSIS — M86179 Other acute osteomyelitis, unspecified ankle and foot: Secondary | ICD-10-CM | POA: Diagnosis not present

## 2021-01-21 DIAGNOSIS — F0393 Unspecified dementia, unspecified severity, with mood disturbance: Secondary | ICD-10-CM | POA: Diagnosis present

## 2021-01-21 LAB — BASIC METABOLIC PANEL
Anion gap: 7 (ref 5–15)
BUN: 22 mg/dL (ref 8–23)
CO2: 23 mmol/L (ref 22–32)
Calcium: 8.8 mg/dL — ABNORMAL LOW (ref 8.9–10.3)
Chloride: 111 mmol/L (ref 98–111)
Creatinine, Ser: 0.95 mg/dL (ref 0.61–1.24)
GFR, Estimated: >60 mL/min (ref >60)
Glucose, Bld: 84 mg/dL (ref 70–99)
Potassium: 4.2 mmol/L (ref 3.5–5.1)
Sodium: 141 mmol/L (ref 135–145)

## 2021-01-21 LAB — CBC
HCT: 42.9 % (ref 39.0–52.0)
Hemoglobin: 14 g/dL (ref 13.0–17.0)
MCH: 29.4 pg (ref 26.0–34.0)
MCHC: 32.6 g/dL (ref 30.0–36.0)
MCV: 89.9 fL (ref 80.0–100.0)
Platelets: 120 10*3/uL — ABNORMAL LOW (ref 150–400)
RBC: 4.77 MIL/uL (ref 4.22–5.81)
RDW: 17.4 % — ABNORMAL HIGH (ref 11.5–15.5)
WBC: 8.3 10*3/uL (ref 4.0–10.5)
nRBC: 0 % (ref 0.0–0.2)

## 2021-01-21 LAB — C-REACTIVE PROTEIN: CRP: 0.7 mg/dL (ref <1.0)

## 2021-01-21 LAB — HIV ANTIBODY (ROUTINE TESTING W REFLEX): HIV Screen 4th Generation wRfx: NONREACTIVE

## 2021-01-21 MED ORDER — CHLORHEXIDINE GLUCONATE CLOTH 2 % EX PADS
6.0000 | MEDICATED_PAD | Freq: Every day | CUTANEOUS | Status: DC
  Administered 2021-01-22 – 2021-02-28 (×32): 6 via TOPICAL

## 2021-01-21 MED ORDER — LACOSAMIDE 50 MG PO TABS
150.0000 mg | ORAL_TABLET | Freq: Two times a day (BID) | ORAL | Status: DC
  Administered 2021-01-21 – 2021-03-04 (×84): 150 mg via ORAL
  Filled 2021-01-21 (×84): qty 3

## 2021-01-21 MED ORDER — LEVETIRACETAM 500 MG PO TABS
1500.0000 mg | ORAL_TABLET | Freq: Two times a day (BID) | ORAL | Status: DC
  Administered 2021-01-21 – 2021-03-04 (×85): 1500 mg via ORAL
  Filled 2021-01-21 (×6): qty 3
  Filled 2021-01-21: qty 6
  Filled 2021-01-21 (×4): qty 3
  Filled 2021-01-21: qty 6
  Filled 2021-01-21 (×36): qty 3
  Filled 2021-01-21: qty 6
  Filled 2021-01-21 (×10): qty 3
  Filled 2021-01-21: qty 6
  Filled 2021-01-21 (×4): qty 3
  Filled 2021-01-21: qty 6
  Filled 2021-01-21 (×21): qty 3

## 2021-01-21 MED ORDER — SODIUM CHLORIDE 0.9% FLUSH
10.0000 mL | INTRAVENOUS | Status: DC | PRN
  Administered 2021-01-21 – 2021-02-13 (×2): 10 mL

## 2021-01-21 MED ORDER — SODIUM CHLORIDE 0.9% FLUSH
10.0000 mL | Freq: Two times a day (BID) | INTRAVENOUS | Status: DC
  Administered 2021-01-21 – 2021-01-31 (×19): 10 mL
  Administered 2021-01-31: 30 mL
  Administered 2021-02-01 – 2021-02-06 (×10): 10 mL
  Administered 2021-02-06: 20 mL
  Administered 2021-02-07: 10 mL
  Administered 2021-02-07: 20 mL
  Administered 2021-02-08 – 2021-02-10 (×5): 10 mL
  Administered 2021-02-11: 30 mL
  Administered 2021-02-11 – 2021-02-21 (×17): 10 mL
  Administered 2021-02-21: 22:00:00 40 mL
  Administered 2021-02-22 – 2021-02-25 (×7): 10 mL

## 2021-01-21 MED ORDER — SODIUM CHLORIDE 0.9 % IV SOLN
2.0000 g | INTRAVENOUS | Status: DC
  Administered 2021-01-21 – 2021-02-16 (×27): 2 g via INTRAVENOUS
  Filled 2021-01-21 (×27): qty 20

## 2021-01-21 MED ORDER — LAMOTRIGINE 25 MG PO TABS
25.0000 mg | ORAL_TABLET | Freq: Every day | ORAL | Status: DC
  Administered 2021-01-21 – 2021-03-04 (×43): 25 mg via ORAL
  Filled 2021-01-21 (×43): qty 1

## 2021-01-21 NOTE — Progress Notes (Signed)
Patient continues to be alert, but confused. He keeps stating that his clothing is in the emergency room, he proceeded to pull out his IV because it was in there too long and needs to go get his clothes. We reassured him his clothing is safe at his home. Reestablished IV access, and contacted Dorthula Perfect ([phone]) to verify that he did not bring clothing with him to the hospital.

## 2021-01-21 NOTE — Progress Notes (Signed)
MRI of the left foot reveals osteomyelitis of the distal aspect of the second toe.  I discussed with the patient the 2 options which are either amputation or IV antibiotics.  He was adamant about not having an amputation.  We will defer to Dr. Arbutus Leas the length of IV antibiotic therapy.  He needs outpatient podiatry consultation.

## 2021-01-21 NOTE — Progress Notes (Signed)
Peripherally Inserted Central Catheter Placement  The IV Nurse has discussed with the patient and/or persons authorized to consent for the patient, the purpose of this procedure and the potential benefits and risks involved with this procedure.  The benefits include less needle sticks, lab draws from the catheter, and the patient may be discharged home with the catheter. Risks include, but not limited to, infection, bleeding, blood clot (thrombus formation), and puncture of an artery; nerve damage and irregular heartbeat and possibility to perform a PICC exchange if needed/ordered by physician.  Alternatives to this procedure were also discussed.  Bard Power PICC patient education guide, fact sheet on infection prevention and patient information card has been provided to patient /or left at bedside. Consent obtained from Dorthula Perfect via telephone with second verify by Youlanda Mighty, RN   PICC Placement Documentation  PICC Single Lumen 01/21/21 Right Basilic 46 cm 0 cm (Active)  Indication for Insertion or Continuance of Line Home intravenous therapies (PICC only);Prolonged intravenous therapies 01/21/21 1648  Exposed Catheter (cm) 0 cm 01/21/21 1648  Site Assessment Clean;Dry;Intact 01/21/21 1648  Line Status Flushed;Saline locked;Blood return noted 01/21/21 1648  Dressing Type Transparent 01/21/21 1648  Dressing Status Clean;Dry;Intact 01/21/21 1648  Antimicrobial disc in place? Yes 01/21/21 1648  Safety Lock Not Applicable 01/21/21 1648  Line Care Connections checked and tightened 01/21/21 1648  Dressing Intervention New dressing 01/21/21 1648  Dressing Change Due 01/28/21 01/21/21 1648       Bing Plume 01/21/2021, 4:49 PM

## 2021-01-21 NOTE — [place] FL2 (Signed)
[place] MEDICAID FL2 LEVEL OF CARE SCREENING TOOL     IDENTIFICATION  Patient Name: [name] [name] Birthdate: 12-18-1960 Sex: male Admission Date (Current Location): 01/20/2021  [facility] and [place] Number:  [employer] and Address:  [facility],  [address], [place] [address]      Provider Number: [id number]  Attending Physician Name and Address:  Catarina Hartshorn, MD  Relative Name and Phone Number:  Dorthula Perfect [facility] Owner [phone]    Current Level of Care: Hospital Recommended Level of Care: Skilled Nursing Facility Prior Approval Number:    Date Approved/Denied:   PASRR Number: [id number]  Discharge Plan: SNF    Current Diagnoses: Patient Active Problem List   Diagnosis Date Noted   Osteomyelitis of second toe of left foot (HCC)    Acute osteomyelitis of toe (HCC) 01/20/2021   Cellulitis of second toe of left foot    Posttraumatic encephalopathy 08/20/2018   H/O prior ablation treatment 05/31/2018   Cocaine abuse with cocaine-induced mood disorder (HCC) 10/26/2017   S/P ablation of atrial fibrillation 07/03/2017   Insomnia 05/07/2017   Atrial flutter (HCC) 03/03/2017   Late effect of cerebrovascular accident (CVA) 01/21/2017   Bipolar disorder (HCC) 01/21/2017   Migraine headache 01/21/2017   Atrial flutter with rapid ventricular response (HCC) 01/10/2017   Syncope 01/10/2017   Gait abnormality 05/26/2016   Smoking 01/05/2015   Chronic pain syndrome 01/05/2015   Non-healing non-surgical wound 12/12/2014   Fall    Paranoia [facility])    Dementia with behavioral disturbance    Encephalopathy, hepatic 10/09/2014   Hereditary and idiopathic peripheral neuropathy 06/03/2014   Seizures (HCC) 01/24/2014   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/23/2014   Fracture of olecranon process of ulna 11/08/2012   Osteomyelitis (HCC) 11/08/2012   Essential hypertension, benign 10/31/2012   History of alcohol abuse 10/26/2012   Chronic hepatitis  C with hepatic coma (HCC) 10/26/2012    Orientation RESPIRATION BLADDER Height & Weight     Self, Time, Situation, Place  Normal Continent Weight: 106.5 kg Height:  6\' 5"  (195.6 cm)  BEHAVIORAL SYMPTOMS/MOOD NEUROLOGICAL BOWEL NUTRITION STATUS      Continent Diet (See DC summary)  AMBULATORY STATUS COMMUNICATION OF NEEDS Skin   Limited Assist   Other (Comment) (Left Toe infection)                       Personal Care Assistance Level of Assistance  Bathing, Feeding, Dressing Bathing Assistance: Limited assistance Feeding assistance: Limited assistance Dressing Assistance: Limited assistance     Functional Limitations Info  Sight, Hearing, Speech Sight Info: Adequate Hearing Info: Adequate Speech Info: Adequate    SPECIAL CARE FACTORS FREQUENCY   (IV antibiotics)                    Contractures Contractures Info: Not present    Additional Factors Info  Code Status, Allergies Code Status Info: FULL Allergies Info: Carbamazepine, depakote           Current Medications (01/21/2021):  This is the current hospital active medication list Current Facility-Administered Medications  Medication Dose Route Frequency Provider Last Rate Last Admin   acetaminophen (TYLENOL) tablet 650 mg  650 mg Oral Q6H PRN Tat, 13/05/2020, MD   650 mg at 01/20/21 2158   Or   acetaminophen (TYLENOL) suppository 650 mg  650 mg Rectal Q6H PRN Tat, 2159, MD       ceFEPIme (MAXIPIME) 2 g in  sodium chloride 0.9 % 100 mL IVPB  2 g Intravenous Andres Labrum, MD 200 mL/hr at 01/21/21 0528 2 g at 01/21/21 0528   diltiazem (CARDIZEM) tablet 60 mg  60 mg Oral Pablo Ledger, MD   60 mg at 01/20/21 2152   enoxaparin (LOVENOX) injection 40 mg  40 mg Subcutaneous Q24H Tat, Onalee Hua, MD   40 mg at 01/20/21 1727   hydrOXYzine (ATARAX/VISTARIL) tablet 25 mg  25 mg Oral BID Tat, Onalee Hua, MD   25 mg at 01/21/21 1103   lacosamide (VIMPAT) tablet 150 mg  150 mg Oral BID Tat, Onalee Hua, MD       lamoTRIgine  (LAMICTAL) tablet 25 mg  25 mg Oral Daily Tat, David, MD       levETIRAcetam (KEPPRA) tablet 1,500 mg  1,500 mg Oral BID Tat, David, MD       metoprolol succinate (TOPROL-XL) 24 hr tablet 25 mg  25 mg Oral Daily Tat, David, MD   25 mg at 01/21/21 1103   QUEtiapine (SEROQUEL) tablet 50 mg  50 mg Oral BID Tat, Onalee Hua, MD   50 mg at 01/21/21 1104   And   QUEtiapine (SEROQUEL) tablet 200 mg  200 mg Oral Maura Crandall, MD   200 mg at 01/20/21 2151   vancomycin (VANCOREADY) IVPB 1500 mg/300 mL  1,500 mg Intravenous Pablo Ledger, MD 150 mL/hr at 01/20/21 2332 1,500 mg at 01/20/21 2332     Discharge Medications: Please see discharge summary for a list of discharge medications.  Relevant Imaging Results:  Relevant Lab Results:   Additional Information SS# [id number]  Leitha Bleak, RN

## 2021-01-21 NOTE — Progress Notes (Signed)
PROGRESS NOTE  [name] [name] CNO:[id number] DOB: [birth year] DOA: 01/20/2021 PCP: [name], NP  Brief History:   60 y.o. male with medical history of MDD with psychosis, seizure disorder, hypertension, tobacco abuse, cognitive impairment, atrial flutter with RVR, chronic hepatitis C, history of cocaine and alcohol abuse presenting due to abnormality with his left second toe.  The patient is a poor historian.  When asked why he is in the emergency department, he states " they tell me there is something wrong with my foot".  Apparently, the patient was getting his toenails trimmed at his assisted living facility [facility]) when it was noticed there was some abnormality and discoloration of his left second toe.  As result, the patient was sent to the emergency department for further evaluation.  The patient himself denies any pain or discomfort about his second toe.  He is unable to provide any history regarding recent injury or trauma.  He states, " I did not there was something wrong with my toe."  He denies any fevers, chills, chest pain, shortness breath, cough, hemoptysis, nausea, vomiting, diarrhea, domino pain, foot pain.  He continues to smoke tobacco.   ED In the emergency department, the patient was afebrile hemodynamically stable with oxygen saturation 100% room air.  BMP showed a sodium 141, potassium 3.9, serum creatinine 1.04.  WBC 7.1, hemoglobin 14.9, platelets 124,000.  Patient was given a dose of vancomycin.  X-ray of the left foot showed soft tissue swelling with erosion of the distal and middle phalanx of the left second toe.  Admission was requested for further work-up and treatment.  Assessment/Plan: Cellulitis left second toe/Acute osteomyelitis -ESR--5 -CRP--0.7 -Check ABIs--left = 1.10; right = 1.09 -Patient has palpable dorsalis pedis pulses bilateral -Surgery consult noted -Continue vancomycin -changed to ceftriaxone -MR left foot--acute osteomyelitis  middle and distal phalanges 2nd toe -PICC line -plan 4 weeks IV abx   Bipolar disorder -Continue quetiapine, sertraline, trazodone -Continue hydroxyzine  Atrial flutter -Continue apixaban -Continue metoprolol succinate -hold diltiazem due to bradycardia   Seizure disorder -Continue lamotrigine, Keppra, Vimpat   Tobacco abuse -Tobacco cessation discussed   Thrombocytopenia -Likely secondary to chronic hepatitis C -Monitor for signs of bleeding   Chronic hepatitis C -Appears clinically compensated       Status is: Observation  The patient will require care spanning > 2 midnights and should be moved to inpatient because: need for IV abx       Family Communication:   caregiver updated 11/3  Consultants:  surgery  Code Status:  FULL   DVT Prophylaxis:  [place] Lovenox   Procedures: As Listed in Progress Note Above  Antibiotics: Vanco 11/2>> Ceftriaxone 11/3>> Cefepime 11/2        Subjective: Patient complains of pain in left second toe.  Denies f/c, cp, sob, n/v/d  Objective: Vitals:   01/20/21 1445 01/20/21 1800 01/20/21 1944 01/21/21 1034  BP: 103/62 105/80 104/76 132/71  Pulse: (!) 52 (!) 39 68 (!) 56  Resp: 18 18 16 16   Temp:   98.1 F (36.7 C)   TempSrc:   Oral   SpO2: 100% 97% 100% 96%  Weight:   106.5 kg   Height:   6' 5"  (1.956 m)     Intake/Output Summary (Last 24 hours) at 01/21/2021 1234 Last data filed at 01/21/2021 0617 Gross per 24 hour  Intake 401.3 ml  Output --  Net 401.3 ml   Weight change:  Exam:  General:  Pt is alert, follows commands appropriately, not in acute distress HEENT: No icterus, No thrush, No neck mass, [place]/AT Cardiovascular: RRR, S1/S2, no rubs, no gallops Respiratory: CTA bilaterally, no wheezing, no crackles, no rhonchi Abdomen: Soft/+BS, non tender, non distended, no guarding Extremities: edema with discoloration of left second toe without crepitance;  DP pulse palpable bilateral   Data  Reviewed: I have personally reviewed following labs and imaging studies Basic Metabolic Panel: Recent Labs  Lab 01/20/21 1228 01/21/21 0543  NA 141 141  K 3.9 4.2  CL 109 111  CO2 26 23  GLUCOSE 80 84  BUN 19 22  CREATININE 1.04 0.95  CALCIUM 9.0 8.8*   Liver Function Tests: No results for input(s): AST, ALT, ALKPHOS, BILITOT, PROT, ALBUMIN in the last 168 hours. No results for input(s): LIPASE, AMYLASE in the last 168 hours. No results for input(s): AMMONIA in the last 168 hours. Coagulation Profile: No results for input(s): INR, PROTIME in the last 168 hours. CBC: Recent Labs  Lab 01/20/21 1228 01/21/21 0543  WBC 7.1 8.3  NEUTROABS 1.7  --   HGB 14.9 14.0  HCT 44.6 42.9  MCV 88.3 89.9  PLT 124* 120*   Cardiac Enzymes: No results for input(s): CKTOTAL, CKMB, CKMBINDEX, TROPONINI in the last 168 hours. BNP: Invalid input(s): POCBNP CBG: No results for input(s): GLUCAP in the last 168 hours. HbA1C: No results for input(s): HGBA1C in the last 72 hours. Urine analysis:    Component Value Date/Time   COLORURINE YELLOW 12/12/2017 [place] 12/12/2017 0931   LABSPEC 1.023 12/12/2017 0931   PHURINE 6.0 12/12/2017 0931   GLUCOSEU NEGATIVE 12/12/2017 0931   HGBUR NEGATIVE 12/12/2017 0931   BILIRUBINUR NEGATIVE 12/12/2017 0931   BILIRUBINUR [place] 10/19/2015 1526   KETONESUR NEGATIVE 12/12/2017 0931   PROTEINUR NEGATIVE 12/12/2017 0931   UROBILINOGEN >=8.0 10/19/2015 1526   UROBILINOGEN 0.2 10/31/2014 1840   NITRITE NEGATIVE 12/12/2017 0931   LEUKOCYTESUR NEGATIVE 12/12/2017 0931   Sepsis Labs: @LABRCNTIP (procalcitonin:4,lacticidven:4) ) Recent Results (from the past 240 hour(s))  Resp Panel by RT-PCR (Flu A&B, Covid) Nasopharyngeal Swab     Status: None   Collection Time: 01/20/21  1:43 PM   Specimen: Nasopharyngeal Swab; Nasopharyngeal(NP) swabs in vial transport medium  Result Value Ref Range Status   SARS Coronavirus 2 by RT PCR NEGATIVE  NEGATIVE Final    Comment: (NOTE) SARS-CoV-2 target nucleic acids are NOT DETECTED.  The SARS-CoV-2 RNA is generally detectable in upper respiratory specimens during the acute phase of infection. The lowest concentration of SARS-CoV-2 viral copies this assay can detect is 138 copies/mL. A negative result does not preclude SARS-Cov-2 infection and should not be used as the sole basis for treatment or other patient management decisions. A negative result may occur with  improper specimen collection/handling, submission of specimen other than nasopharyngeal swab, presence of viral mutation(s) within the areas targeted by this assay, and inadequate number of viral copies(<138 copies/mL). A negative result must be combined with clinical observations, patient history, and epidemiological information. The expected result is Negative.  Fact Sheet for Patients:  EntrepreneurPulse.com.au  Fact Sheet for Healthcare Providers:  IncredibleEmployment.be  This test is no t yet approved or cleared by the [place] FDA and  has been authorized for detection and/or diagnosis of SARS-CoV-2 by FDA under an Emergency Use Authorization (EUA). This EUA will remain  in effect (meaning this test can be used) for the duration of the COVID-19 declaration under Section 564(b)(1)  of the Act, 21 U.S.C.section 360bbb-3(b)(1), unless the authorization is terminated  or revoked sooner.       Influenza A by PCR NEGATIVE NEGATIVE Final   Influenza B by PCR NEGATIVE NEGATIVE Final    Comment: (NOTE) The Xpert Xpress SARS-CoV-2/FLU/RSV plus assay is intended as an aid in the diagnosis of influenza from Nasopharyngeal swab specimens and should not be used as a sole basis for treatment. Nasal washings and aspirates are unacceptable for Xpert Xpress SARS-CoV-2/FLU/RSV testing.  Fact Sheet for Patients: EntrepreneurPulse.com.au  Fact Sheet for Healthcare  Providers: IncredibleEmployment.be  This test is not yet approved or cleared by the [place] FDA and has been authorized for detection and/or diagnosis of SARS-CoV-2 by FDA under an Emergency Use Authorization (EUA). This EUA will remain in effect (meaning this test can be used) for the duration of the COVID-19 declaration under Section 564(b)(1) of the Act, 21 U.S.C. section 360bbb-3(b)(1), unless the authorization is terminated or revoked.  Performed at [facility], [address] [place], [place] [address]      Scheduled Meds:  diltiazem  60 mg Oral Q12H   enoxaparin (LOVENOX) injection  40 mg Subcutaneous Q24H   hydrOXYzine  25 mg Oral BID   lacosamide  150 mg Oral BID   lamoTRIgine  25 mg Oral Daily   levETIRAcetam  1,500 mg Oral BID   metoprolol succinate  25 mg Oral Daily   QUEtiapine  50 mg Oral BID   And   QUEtiapine  200 mg Oral QHS   Continuous Infusions:  ceFEPime (MAXIPIME) IV 2 g (01/21/21 0528)   vancomycin 1,500 mg (01/21/21 1228)    Procedures/Studies: MR FOOT LEFT W WO CONTRAST  Result Date: 01/21/2021 CLINICAL DATA:  Cellulitis of the left second toe EXAM: MRI OF THE LEFT FOREFOOT WITHOUT AND WITH CONTRAST TECHNIQUE: Multiplanar, multisequence MR imaging of the left forefoot was performed both before and after administration of intravenous contrast. CONTRAST:  50m GADAVIST GADOBUTROL 1 MMOL/ML IV SOLN COMPARISON:  01/20/2021 FINDINGS: Bones/Joint/Cartilage Diffuse edema signal and enhancement in the middle and distal phalanges of the second toe compatible with active osteomyelitis. There is deformity of the distal phalanx of the great toe with scalloping of the tuft but with little in the way of edema. This may be the result of chronic deformity or remote osteomyelitis but we do not demonstrate striking edema or enhancement in the remaining distal phalanx a thing this less likely to be from acute osteomyelitis. There is some thinning of  the overlying soft tissues of the distal toe. Correlate patient history. 5 mm degenerative subcortical cystic lesion or geode in the proximal navicular. Ligaments Lisfranc ligament intact Muscles and Tendons Regional muscular atrophy Soft tissues Subcutaneous edema and enhancement in the second toe compatible with cellulitis. Dorsal subcutaneous edema in the forefoot is not a complete by enhancement and accordingly is not necessarily infectious. IMPRESSION: 1. Active osteomyelitis in the middle and distal phalanges of the second toe with surrounding cellulitis. 2. Deformity of the distal phalanx of the great toe but without abnormal edema or enhancement either in the distal phalanx or in the surrounding soft tissues. Accordingly this is more likely to represent deformity from an old/chronic process rather than acute osteomyelitis. Electronically Signed   By: WVan ClinesM.D.   On: 01/21/2021 06:45   [place] ABI (SCREENING LOWER EXTREMITY)  Result Date: 01/21/2021 CLINICAL DATA:  Hypertension Current smoker Left second toe cellulitis Left breast pain EXAM: NONINVASIVE PHYSIOLOGIC VASCULAR STUDY OF  BILATERAL LOWER EXTREMITIES TECHNIQUE: Evaluation of both lower extremities were performed at rest, including calculation of ankle-brachial indices with single level Doppler, pressure and pulse volume recording. COMPARISON:  None. FINDINGS: Right ABI:  1.10 Left ABI:  1.09 Right Lower Extremity: Monophasic waveform seen in the right posterior tibial and dorsalis pedis arteries. Left Lower Extremity: Monophasic waveform seen in the posterior tibial and dorsalis pedis arteries. 1.0-1.4 Normal IMPRESSION: Although lower extremity ABI values are within normal limits, monophasic waveforms seen at the ankle bilaterally are suspicious for underlying arterial occlusive disease. Consider further evaluation with CT angiography of the abdominal aorta with runoff. Electronically Signed   By: Miachel Roux M.D.   On:  01/21/2021 11:43   DG Foot Complete Left  Result Date: 01/20/2021 CLINICAL DATA:  Second left toe wound EXAM: LEFT FOOT - COMPLETE 3+ VIEW COMPARISON:  None. FINDINGS: Soft tissue swelling of the distal second digit with erosion of the underlying middle and distal phalanx consistent with osteomyelitis. Erosive changes also noted in the distal tip of the distal phalanx of the great toe with overlying soft tissue abnormality suspicious for chronic osteomyelitis. Atherosclerotic changes seen throughout visualized arterial segments. IMPRESSION: Findings suspicious for osteomyelitis of the tip of the second and first toes. Electronically Signed   By: Miachel Roux M.D.   On: 01/20/2021 11:47   [place] EKG SITE RITE  Result Date: 01/21/2021 If Site Rite image not attached, placement could not be confirmed due to current cardiac rhythm.   Orson Eva, DO  [facility]  If 7PM-7AM, please contact night-coverage [email or web address] Password [email or web address] 01/21/2021, 12:34 PM   LOS: 0 days

## 2021-01-21 NOTE — TOC Initial Note (Signed)
Transition of Care [facility]) - Initial/Assessment Note    Patient Details  Name: [name] [name] MRN: [id number] Date of Birth: 07-03-1960  Transition of Care [facility]) CM/SW Contact:    Boneta Lucks, RN Phone Number: 01/21/2021, 12:47 PM  Clinical Narrative:   Patient admitted with acute osteomyelitis of toe. Patient is from [name]. TOC called [facility] owner, Patient does not have a POA, he was alert when he came to them a year ago and made his own decisions. Patient is becoming more confused per Rodena Piety, she is requesting LTC bed. She has FL2 from PCP she will send to [place]. TOC completed and sent out FL2 for the need of IV antibiotics.  TOC to follow. Weekend DC planned.                Expected Discharge Plan: Skilled Nursing Facility Barriers to Discharge: Continued Medical Work up   Patient Goals and CMS Choice Patient states their goals for this hospitalization and ongoing recovery are:: SNF for IV antibiotics CMS Medicare.gov Compare Post Acute Care list provided to:: Patient Represenative (must comment)    Expected Discharge Plan and Services Expected Discharge Plan: [place]      Living arrangements for the past 2 months: [place]                   Prior Living Arrangements/Services Living arrangements for the past 2 months: [place] Lives with:: Facility Resident Patient language and need for interpreter reviewed:: Yes        Need for Family Participation in Patient Care: Yes (Comment) Care giver support system in place?: Yes (comment)   Criminal Activity/Legal Involvement Pertinent to Current Situation/Hospitalization: No - Comment as needed  Activities of Daily Living Home Assistive Devices/Equipment: Walker (specify type) ADL Screening (condition at time of admission) Patient's cognitive ability adequate to safely complete daily activities?: No Is the patient deaf or have difficulty hearing?: No Does the  patient have difficulty seeing, even when wearing glasses/contacts?: No Patient able to express need for assistance with ADLs?: Yes Does the patient have difficulty dressing or bathing?: No Independently performs ADLs?: Yes (appropriate for developmental age) Does the patient have difficulty walking or climbing stairs?: No Weakness of Legs: Left (left toe) Weakness of Arms/Hands: None  Permission Sought/Granted    Emotional Assessment      Orientation: : Oriented to Self, Oriented to Place, Oriented to  Time, Oriented to Situation Alcohol / Substance Use: Not Applicable Psych Involvement: No (comment)  Admission diagnosis:  Cellulitis of second toe of left foot [L03.032] Acute osteomyelitis of toe ([place]) [M86.179] Osteomyelitis of great toe of left foot ([place]) [M86.9] Osteomyelitis of second toe of left foot (HCC) [M86.9] Patient Active Problem List   Diagnosis Date Noted   Osteomyelitis of second toe of left foot ([place])    Acute osteomyelitis of toe ([place]) 01/20/2021   Cellulitis of second toe of left foot    Posttraumatic encephalopathy 08/20/2018   H/O prior ablation treatment 05/31/2018   Cocaine abuse with cocaine-induced mood disorder ([place]) 10/26/2017   S/P ablation of atrial fibrillation 07/03/2017   Insomnia 05/07/2017   Atrial flutter ([place]) 03/03/2017   Late effect of cerebrovascular accident (CVA) 01/21/2017   Bipolar disorder ([place]) 01/21/2017   Migraine headache 01/21/2017   Atrial flutter with rapid ventricular response ([place]) 01/10/2017   Syncope 01/10/2017   Gait abnormality 05/26/2016   Smoking 01/05/2015   Chronic pain syndrome 01/05/2015   Non-healing non-surgical wound  12/12/2014   Fall    Paranoia [facility])    Dementia with behavioral disturbance    Encephalopathy, hepatic 10/09/2014   Hereditary and idiopathic peripheral neuropathy 06/03/2014   Seizures (HCC) 01/24/2014   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/23/2014   Fracture of olecranon process of ulna  11/08/2012   Osteomyelitis (HCC) 11/08/2012   Essential hypertension, benign 10/31/2012   History of alcohol abuse 10/26/2012   Chronic hepatitis C with hepatic coma (HCC) 10/26/2012   PCP:  Dairl Ponder, NP Pharmacy:   [facility] [place], [place] - [address] [place] [address] Phone: [phone] Fax: [phone]  [facility] [place], [place] - [address] [place] [address] Phone: [phone] Fax: [phone]    Readmission Risk Interventions No flowsheet data found.

## 2021-01-22 ENCOUNTER — Encounter (HOSPITAL_COMMUNITY): Payer: Self-pay | Admitting: Internal Medicine

## 2021-01-22 DIAGNOSIS — M86172 Other acute osteomyelitis, left ankle and foot: Secondary | ICD-10-CM | POA: Diagnosis not present

## 2021-01-22 DIAGNOSIS — M869 Osteomyelitis, unspecified: Secondary | ICD-10-CM | POA: Diagnosis not present

## 2021-01-22 DIAGNOSIS — L03032 Cellulitis of left toe: Secondary | ICD-10-CM | POA: Diagnosis not present

## 2021-01-22 DIAGNOSIS — F17200 Nicotine dependence, unspecified, uncomplicated: Secondary | ICD-10-CM | POA: Diagnosis not present

## 2021-01-22 MED ORDER — NICOTINE 14 MG/24HR TD PT24
14.0000 mg | MEDICATED_PATCH | Freq: Every day | TRANSDERMAL | Status: DC
  Administered 2021-01-22 – 2021-03-04 (×42): 14 mg via TRANSDERMAL
  Filled 2021-01-22 (×42): qty 1

## 2021-01-22 MED ORDER — SODIUM CHLORIDE 0.9 % IV SOLN
700.0000 mg | Freq: Every day | INTRAVENOUS | Status: DC
  Administered 2021-01-22 – 2021-02-16 (×26): 700 mg via INTRAVENOUS
  Filled 2021-01-22 (×27): qty 14

## 2021-01-22 NOTE — Progress Notes (Signed)
Subjective: No complaints of pain.  Objective: Vital signs in last 24 hours: Temp:  [97.3 F (36.3 C)-98.7 F (37.1 C)] 97.3 F (36.3 C) (11/04 1217) Pulse Rate:  [41-59] 41 (11/04 1217) Resp:  [16-17] 16 (11/04 1217) BP: (108-152)/(61-90) 108/61 (11/04 1217) SpO2:  [97 %-100 %] 99 % (11/04 1217) Last BM Date: 01/21/21  Intake/Output from previous day: 11/03 0701 - 11/04 0700 In: 2016.4 [P.O.:1360; IV Piggyback:656.4] Out: -  Intake/Output this shift: Total I/O In: 1057 [P.O.:720; IV Piggyback:337] Out: -   General appearance: alert, cooperative, and no distress Extremities: Left second toe slightly less swollen.  No drainage noted.  Lab Results:  Recent Labs    01/20/21 1228 01/21/21 0543  WBC 7.1 8.3  HGB 14.9 14.0  HCT 44.6 42.9  PLT 124* 120*   BMET Recent Labs    01/20/21 1228 01/21/21 0543  NA 141 141  K 3.9 4.2  CL 109 111  CO2 26 23  GLUCOSE 80 84  BUN 19 22  CREATININE 1.04 0.95  CALCIUM 9.0 8.8*   PT/INR No results for input(s): LABPROT, INR in the last 72 hours.  Studies/Results: MR FOOT LEFT W WO CONTRAST  Result Date: 01/21/2021 CLINICAL DATA:  Cellulitis of the left second toe EXAM: MRI OF THE LEFT FOREFOOT WITHOUT AND WITH CONTRAST TECHNIQUE: Multiplanar, multisequence MR imaging of the left forefoot was performed both before and after administration of intravenous contrast. CONTRAST:  61mL GADAVIST GADOBUTROL 1 MMOL/ML IV SOLN COMPARISON:  01/20/2021 FINDINGS: Bones/Joint/Cartilage Diffuse edema signal and enhancement in the middle and distal phalanges of the second toe compatible with active osteomyelitis. There is deformity of the distal phalanx of the great toe with scalloping of the tuft but with little in the way of edema. This may be the result of chronic deformity or remote osteomyelitis but we do not demonstrate striking edema or enhancement in the remaining distal phalanx a thing this less likely to be from acute osteomyelitis.  There is some thinning of the overlying soft tissues of the distal toe. Correlate patient history. 5 mm degenerative subcortical cystic lesion or geode in the proximal navicular. Ligaments Lisfranc ligament intact Muscles and Tendons Regional muscular atrophy Soft tissues Subcutaneous edema and enhancement in the second toe compatible with cellulitis. Dorsal subcutaneous edema in the forefoot is not a complete by enhancement and accordingly is not necessarily infectious. IMPRESSION: 1. Active osteomyelitis in the middle and distal phalanges of the second toe with surrounding cellulitis. 2. Deformity of the distal phalanx of the great toe but without abnormal edema or enhancement either in the distal phalanx or in the surrounding soft tissues. Accordingly this is more likely to represent deformity from an old/chronic process rather than acute osteomyelitis. Electronically Signed   By: Gaylyn Rong M.D.   On: 01/21/2021 06:45   US ARTERIAL ABI (SCREENING LOWER EXTREMITY)  Result Date: 01/21/2021 CLINICAL DATA:  Hypertension Current smoker Left second toe cellulitis Left breast pain EXAM: NONINVASIVE PHYSIOLOGIC VASCULAR STUDY OF BILATERAL LOWER EXTREMITIES TECHNIQUE: Evaluation of both lower extremities were performed at rest, including calculation of ankle-brachial indices with single level Doppler, pressure and pulse volume recording. COMPARISON:  None. FINDINGS: Right ABI:  1.10 Left ABI:  1.09 Right Lower Extremity: Monophasic waveform seen in the right posterior tibial and dorsalis pedis arteries. Left Lower Extremity: Monophasic waveform seen in the posterior tibial and dorsalis pedis arteries. 1.0-1.4 Normal IMPRESSION: Although lower extremity ABI values are within normal limits, monophasic waveforms seen at the ankle bilaterally  are suspicious for underlying arterial occlusive disease. Consider further evaluation with CT angiography of the abdominal aorta with runoff. Electronically Signed   By:  Acquanetta Belling M.D.   On: 01/21/2021 11:43   [place] EKG SITE RITE  Result Date: 01/21/2021 If Site Rite image not attached, placement could not be confirmed due to current cardiac rhythm.   Anti-infectives: Anti-infectives (From admission, onward)    Start     Dose/Rate Route Frequency Ordered Stop   01/22/21 1600  DAPTOmycin (CUBICIN) 700 mg in sodium chloride 0.9 % IVPB        700 mg 128 mL/hr over 30 Minutes Intravenous Daily 01/22/21 1248     01/21/21 1330  cefTRIAXone (ROCEPHIN) 2 g in sodium chloride 0.9 % 100 mL IVPB        2 g 200 mL/hr over 30 Minutes Intravenous Every 24 hours 01/21/21 1238     01/21/21 0030  vancomycin (VANCOREADY) IVPB 1500 mg/300 mL  Status:  Discontinued       See Hyperspace for full Linked Orders Report.   1,500 mg 150 mL/hr over 120 Minutes Intravenous Every 12 hours 01/20/21 1226 01/22/21 1248   01/20/21 1545  ceFEPIme (MAXIPIME) 2 g in sodium chloride 0.9 % 100 mL IVPB  Status:  Discontinued        2 g 200 mL/hr over 30 Minutes Intravenous Every 8 hours 01/20/21 1540 01/21/21 1238   01/20/21 1230  vancomycin (VANCOREADY) IVPB 2000 mg/400 mL       See Hyperspace for full Linked Orders Report.   2,000 mg 200 mL/hr over 120 Minutes Intravenous  Once 01/20/21 1226 01/20/21 1450       Assessment/Plan: Imp:  Left second toe cellulitis.  PICC line in place.  Looking for placement.  LOS: 1 day    Franky Macho 01/22/2021

## 2021-01-22 NOTE — TOC Progression Note (Signed)
Transition of Care [facility]) - Progression Note    Patient Details  Name: [name] [name] MRN: [id number] Date of Birth: 1961-03-18  [facility]) CM/SW Contact  Leitha Bleak, RN Phone Number: 01/22/2021, 12:50 PM  Clinical Narrative:   Eunice Blase is working with Corporate to get [facility] approved. He has community medicaid> She will have to get his last 3 months of financial information to make the change. Pharmacy is asking to change IV antibiotic to Daptomycin, Debbie said once medicaid is approved it can be bill under his insurance and will not hold him up from being admitted.  Patient will be medically ready the weekend. Discharge delay will be insurance.    Expected Discharge Plan: Skilled Nursing Facility Barriers to Discharge: Continued Medical Work up  Expected Discharge Plan and Services Expected Discharge Plan: Skilled Nursing Facility    Living arrangements for the past 2 months: Assisted Living Facility    Readmission Risk Interventions Readmission Risk Prevention Plan 01/22/2021  Transportation Screening Complete  HRI or Home Care Consult Complete  Social Work Consult for Recovery Care Planning/Counseling Complete  Palliative Care Screening Not Applicable  Medication Review [employer]) Complete  Some recent data might be hidden

## 2021-01-22 NOTE — Progress Notes (Signed)
PROGRESS NOTE  [name] [name] VCB:[id number] DOB: 08-23-1960 DOA: 01/20/2021 PCP: [name], NP  Brief History:   60 y.o. male with medical history of MDD with psychosis, seizure disorder, hypertension, tobacco abuse, cognitive impairment, atrial flutter with RVR, chronic hepatitis C, history of cocaine and alcohol abuse presenting due to abnormality with his left second toe.  The patient is a poor historian.  When asked why he is in the emergency department, he states " they tell me there is something wrong with my foot".  Apparently, the patient was getting his toenails trimmed at his assisted living facility [facility]) when it was noticed there was some abnormality and discoloration of his left second toe.  As result, the patient was sent to the emergency department for further evaluation.  The patient himself denies any pain or discomfort about his second toe.  He is unable to provide any history regarding recent injury or trauma.  He states, " I did not there was something wrong with my toe."  He denies any fevers, chills, chest pain, shortness breath, cough, hemoptysis, nausea, vomiting, diarrhea, domino pain, foot pain.  He continues to smoke tobacco.   ED In the emergency department, the patient was afebrile hemodynamically stable with oxygen saturation 100% room air.  BMP showed a sodium 141, potassium 3.9, serum creatinine 1.04.  WBC 7.1, hemoglobin 14.9, platelets 124,000.  Patient was given a dose of vancomycin.  X-ray of the left foot showed soft tissue swelling with erosion of the distal and middle phalanx of the left second toe.  Admission was requested for further work-up and treatment.   Assessment/Plan: Cellulitis left second toe/Acute osteomyelitis -ESR--5 -CRP--0.7 -Check ABIs--left = 1.10; right = 1.09 -Patient has palpable dorsalis pedis pulses bilateral -Surgery consult noted -Continue vancomycin>>changed to daptomycin -changed to ceftriaxone -MR left  foot--acute osteomyelitis middle and distal phalanges 2nd toe -PICC line placed 11/3 -plan 4 weeks IV abx   Bipolar disorder -Continue quetiapine, sertraline, trazodone -Continue hydroxyzine  Atrial flutter -Continue apixaban -Continue metoprolol succinate -hold diltiazem due to bradycardia   Seizure disorder -Continue lamotrigine, Keppra, Vimpat   Tobacco abuse -Tobacco cessation discussed   Thrombocytopenia -Likely secondary to chronic hepatitis C -Monitor for signs of bleeding   Chronic hepatitis C -Appears clinically compensated           Status is: Observation   The patient will require care spanning > 2 midnights and should be moved to inpatient because: need for IV abx             Family Communication:   caregiver updated 11/3   Consultants:  surgery   Code Status:  FULL    DVT Prophylaxis:  [place] Lovenox     Procedures: As Listed in Progress Note Above   Antibiotics: Vanco 11/2>>11/4 Cubicin 11/4>> Ceftriaxone 11/3>> Cefepime 11/2    Subjective: Patient complains of pain in left second toe.  Denies f/c, cp, sob, n/v/d  Objective: Vitals:   01/22/21 0517 01/22/21 0700 01/22/21 0905 01/22/21 1217  BP: 128/85 127/63 120/68 108/61  Pulse: (!) 57 (!) 53 (!) 58 (!) 41  Resp: 17 17  16   Temp: (!) 97.5 F (36.4 C) 98.7 F (37.1 C)  (!) 97.3 F (36.3 C)  TempSrc: Oral Oral  Oral  SpO2: 100% 100%  99%  Weight:      Height:        Intake/Output Summary (Last 24 hours) at 01/22/2021 1805 Last data filed  at 01/22/2021 1639 Gross per 24 hour  Intake 2689.6 ml  Output --  Net 2689.6 ml   Weight change:  Exam:  General:  Pt is alert, follows commands appropriately, not in acute distress HEENT: No icterus, No thrush, No neck mass, [place]/AT Cardiovascular: RRR, S1/S2, no rubs, no gallops Respiratory: diminished BS but CTA Abdomen: Soft/+BS, non tender, non distended, no guarding Extremities: mild edema and discoloration of left second toe  without crepitance   Data Reviewed: I have personally reviewed following labs and imaging studies Basic Metabolic Panel: Recent Labs  Lab 01/20/21 1228 01/21/21 0543  NA 141 141  K 3.9 4.2  CL 109 111  CO2 26 23  GLUCOSE 80 84  BUN 19 22  CREATININE 1.04 0.95  CALCIUM 9.0 8.8*   Liver Function Tests: No results for input(s): AST, ALT, ALKPHOS, BILITOT, PROT, ALBUMIN in the last 168 hours. No results for input(s): LIPASE, AMYLASE in the last 168 hours. No results for input(s): AMMONIA in the last 168 hours. Coagulation Profile: No results for input(s): INR, PROTIME in the last 168 hours. CBC: Recent Labs  Lab 01/20/21 1228 01/21/21 0543  WBC 7.1 8.3  NEUTROABS 1.7  --   HGB 14.9 14.0  HCT 44.6 42.9  MCV 88.3 89.9  PLT 124* 120*   Cardiac Enzymes: No results for input(s): CKTOTAL, CKMB, CKMBINDEX, TROPONINI in the last 168 hours. BNP: Invalid input(s): POCBNP CBG: No results for input(s): GLUCAP in the last 168 hours. HbA1C: No results for input(s): HGBA1C in the last 72 hours. Urine analysis:    Component Value Date/Time   COLORURINE YELLOW 12/12/2017 [place] 12/12/2017 0931   LABSPEC 1.023 12/12/2017 0931   PHURINE 6.0 12/12/2017 0931   GLUCOSEU NEGATIVE 12/12/2017 0931   HGBUR NEGATIVE 12/12/2017 0931   BILIRUBINUR NEGATIVE 12/12/2017 0931   BILIRUBINUR [place] 10/19/2015 1526   KETONESUR NEGATIVE 12/12/2017 0931   PROTEINUR NEGATIVE 12/12/2017 0931   UROBILINOGEN >=8.0 10/19/2015 1526   UROBILINOGEN 0.2 10/31/2014 1840   NITRITE NEGATIVE 12/12/2017 0931   LEUKOCYTESUR NEGATIVE 12/12/2017 0931   Sepsis Labs: @LABRCNTIP (procalcitonin:4,lacticidven:4) ) Recent Results (from the past 240 hour(s))  Resp Panel by RT-PCR (Flu A&B, Covid) Nasopharyngeal Swab     Status: None   Collection Time: 01/20/21  1:43 PM   Specimen: Nasopharyngeal Swab; Nasopharyngeal(NP) swabs in vial transport medium  Result Value Ref Range Status   SARS  Coronavirus 2 by RT PCR NEGATIVE NEGATIVE Final    Comment: (NOTE) SARS-CoV-2 target nucleic acids are NOT DETECTED.  The SARS-CoV-2 RNA is generally detectable in upper respiratory specimens during the acute phase of infection. The lowest concentration of SARS-CoV-2 viral copies this assay can detect is 138 copies/mL. A negative result does not preclude SARS-Cov-2 infection and should not be used as the sole basis for treatment or other patient management decisions. A negative result may occur with  improper specimen collection/handling, submission of specimen other than nasopharyngeal swab, presence of viral mutation(s) within the areas targeted by this assay, and inadequate number of viral copies(<138 copies/mL). A negative result must be combined with clinical observations, patient history, and epidemiological information. The expected result is Negative.  Fact Sheet for Patients:  EntrepreneurPulse.com.au  Fact Sheet for Healthcare Providers:  IncredibleEmployment.be  This test is no t yet approved or cleared by the [place] FDA and  has been authorized for detection and/or diagnosis of SARS-CoV-2 by FDA under an Emergency Use Authorization (EUA). This EUA will remain  in  effect (meaning this test can be used) for the duration of the COVID-19 declaration under Section 564(b)(1) of the Act, 21 U.S.C.section 360bbb-3(b)(1), unless the authorization is terminated  or revoked sooner.       Influenza A by PCR NEGATIVE NEGATIVE Final   Influenza B by PCR NEGATIVE NEGATIVE Final    Comment: (NOTE) The Xpert Xpress SARS-CoV-2/FLU/RSV plus assay is intended as an aid in the diagnosis of influenza from Nasopharyngeal swab specimens and should not be used as a sole basis for treatment. Nasal washings and aspirates are unacceptable for Xpert Xpress SARS-CoV-2/FLU/RSV testing.  Fact Sheet for  Patients: EntrepreneurPulse.com.au  Fact Sheet for Healthcare Providers: IncredibleEmployment.be  This test is not yet approved or cleared by the [place] FDA and has been authorized for detection and/or diagnosis of SARS-CoV-2 by FDA under an Emergency Use Authorization (EUA). This EUA will remain in effect (meaning this test can be used) for the duration of the COVID-19 declaration under Section 564(b)(1) of the Act, 21 U.S.C. section 360bbb-3(b)(1), unless the authorization is terminated or revoked.  Performed at [facility], [address] [place], [place] [address]      Scheduled Meds:  Chlorhexidine Gluconate Cloth  6 each Topical Daily   diltiazem  60 mg Oral Q12H   enoxaparin (LOVENOX) injection  40 mg Subcutaneous Q24H   hydrOXYzine  25 mg Oral BID   lacosamide  150 mg Oral BID   lamoTRIgine  25 mg Oral Daily   levETIRAcetam  1,500 mg Oral BID   metoprolol succinate  25 mg Oral Daily   nicotine  14 mg Transdermal Daily   QUEtiapine  50 mg Oral BID   And   QUEtiapine  200 mg Oral QHS   sodium chloride flush  10-40 mL Intracatheter Q12H   Continuous Infusions:  cefTRIAXone (ROCEPHIN)  IV 2 g (01/22/21 1338)   DAPTOmycin (CUBICIN)  IV 700 mg (01/22/21 1639)    Procedures/Studies: MR FOOT LEFT W WO CONTRAST  Result Date: 01/21/2021 CLINICAL DATA:  Cellulitis of the left second toe EXAM: MRI OF THE LEFT FOREFOOT WITHOUT AND WITH CONTRAST TECHNIQUE: Multiplanar, multisequence MR imaging of the left forefoot was performed both before and after administration of intravenous contrast. CONTRAST:  53m GADAVIST GADOBUTROL 1 MMOL/ML IV SOLN COMPARISON:  01/20/2021 FINDINGS: Bones/Joint/Cartilage Diffuse edema signal and enhancement in the middle and distal phalanges of the second toe compatible with active osteomyelitis. There is deformity of the distal phalanx of the great toe with scalloping of the tuft but with little in the way of  edema. This may be the result of chronic deformity or remote osteomyelitis but we do not demonstrate striking edema or enhancement in the remaining distal phalanx a thing this less likely to be from acute osteomyelitis. There is some thinning of the overlying soft tissues of the distal toe. Correlate patient history. 5 mm degenerative subcortical cystic lesion or geode in the proximal navicular. Ligaments Lisfranc ligament intact Muscles and Tendons Regional muscular atrophy Soft tissues Subcutaneous edema and enhancement in the second toe compatible with cellulitis. Dorsal subcutaneous edema in the forefoot is not a complete by enhancement and accordingly is not necessarily infectious. IMPRESSION: 1. Active osteomyelitis in the middle and distal phalanges of the second toe with surrounding cellulitis. 2. Deformity of the distal phalanx of the great toe but without abnormal edema or enhancement either in the distal phalanx or in the surrounding soft tissues. Accordingly this is more likely to represent deformity from an old/chronic process  rather than acute osteomyelitis. Electronically Signed   By: Van Clines M.D.   On: 01/21/2021 06:45   US ARTERIAL ABI (SCREENING LOWER EXTREMITY)  Result Date: 01/21/2021 CLINICAL DATA:  Hypertension Current smoker Left second toe cellulitis Left breast pain EXAM: NONINVASIVE PHYSIOLOGIC VASCULAR STUDY OF BILATERAL LOWER EXTREMITIES TECHNIQUE: Evaluation of both lower extremities were performed at rest, including calculation of ankle-brachial indices with single level Doppler, pressure and pulse volume recording. COMPARISON:  None. FINDINGS: Right ABI:  1.10 Left ABI:  1.09 Right Lower Extremity: Monophasic waveform seen in the right posterior tibial and dorsalis pedis arteries. Left Lower Extremity: Monophasic waveform seen in the posterior tibial and dorsalis pedis arteries. 1.0-1.4 Normal IMPRESSION: Although lower extremity ABI values are within normal limits,  monophasic waveforms seen at the ankle bilaterally are suspicious for underlying arterial occlusive disease. Consider further evaluation with CT angiography of the abdominal aorta with runoff. Electronically Signed   By: Miachel Roux M.D.   On: 01/21/2021 11:43   DG Foot Complete Left  Result Date: 01/20/2021 CLINICAL DATA:  Second left toe wound EXAM: LEFT FOOT - COMPLETE 3+ VIEW COMPARISON:  None. FINDINGS: Soft tissue swelling of the distal second digit with erosion of the underlying middle and distal phalanx consistent with osteomyelitis. Erosive changes also noted in the distal tip of the distal phalanx of the great toe with overlying soft tissue abnormality suspicious for chronic osteomyelitis. Atherosclerotic changes seen throughout visualized arterial segments. IMPRESSION: Findings suspicious for osteomyelitis of the tip of the second and first toes. Electronically Signed   By: Miachel Roux M.D.   On: 01/20/2021 11:47   [place] EKG SITE RITE  Result Date: 01/21/2021 If Site Rite image not attached, placement could not be confirmed due to current cardiac rhythm.   Orson Eva, DO  [facility]  If 7PM-7AM, please contact night-coverage [email or web address] Password [email or web address] 01/22/2021, 6:05 PM   LOS: 1 day

## 2021-01-22 NOTE — Plan of Care (Signed)

## 2021-01-23 DIAGNOSIS — F17200 Nicotine dependence, unspecified, uncomplicated: Secondary | ICD-10-CM | POA: Diagnosis not present

## 2021-01-23 DIAGNOSIS — M869 Osteomyelitis, unspecified: Secondary | ICD-10-CM

## 2021-01-23 DIAGNOSIS — M86179 Other acute osteomyelitis, unspecified ankle and foot: Secondary | ICD-10-CM

## 2021-01-23 LAB — BASIC METABOLIC PANEL
Anion gap: 5 (ref 5–15)
BUN: 12 mg/dL (ref 8–23)
CO2: 24 mmol/L (ref 22–32)
Calcium: 8.6 mg/dL — ABNORMAL LOW (ref 8.9–10.3)
Chloride: 109 mmol/L (ref 98–111)
Creatinine, Ser: 0.86 mg/dL (ref 0.61–1.24)
GFR, Estimated: >60 mL/min (ref >60)
Glucose, Bld: 92 mg/dL (ref 70–99)
Potassium: 4.3 mmol/L (ref 3.5–5.1)
Sodium: 138 mmol/L (ref 135–145)

## 2021-01-23 LAB — CK: Total CK: 55 U/L (ref 49–397)

## 2021-01-23 NOTE — Progress Notes (Signed)
PROGRESS NOTE  [name] [name] NLG:[id number] DOB: 02/08/1961 DOA: 01/20/2021 PCP: [name], NP  Brief History:   60 y.o. male with medical history of MDD with psychosis, seizure disorder, hypertension, tobacco abuse, cognitive impairment, atrial flutter with RVR, chronic hepatitis C, history of cocaine and alcohol abuse presenting due to abnormality with his left second toe.  The patient is a poor historian.  When asked why he is in the emergency department, he states " they tell me there is something wrong with my foot".  Apparently, the patient was getting his toenails trimmed at his assisted living facility [facility]) when it was noticed there was some abnormality and discoloration of his left second toe.  As result, the patient was sent to the emergency department for further evaluation.  The patient himself denies any pain or discomfort about his second toe.  He is unable to provide any history regarding recent injury or trauma.  He states, " I did not there was something wrong with my toe."  He denies any fevers, chills, chest pain, shortness breath, cough, hemoptysis, nausea, vomiting, diarrhea, domino pain, foot pain.  He continues to smoke tobacco.   ED In the emergency department, the patient was afebrile hemodynamically stable with oxygen saturation 100% room air.  BMP showed a sodium 141, potassium 3.9, serum creatinine 1.04.  WBC 7.1, hemoglobin 14.9, platelets 124,000.  Patient was given a dose of vancomycin.  X-ray of the left foot showed soft tissue swelling with erosion of the distal and middle phalanx of the left second toe.  Admission was requested for further work-up and treatment.   Assessment/Plan: Cellulitis left second toe/Acute osteomyelitis -ESR--5 -CRP--0.7 -Check ABIs--left = 1.10; right = 1.09 -Patient has palpable dorsalis pedis pulses bilateral -Surgery consult noted -Continue vancomycin>>changed to daptomycin -changed to ceftriaxone -MR left  foot--acute osteomyelitis middle and distal phalanges 2nd toe -PICC line placed 11/3 -plan 4 weeks IV abx--last day 02/17/21   Bipolar disorder -Continue quetiapine, sertraline, trazodone -Continue hydroxyzine  Atrial flutter -Continue apixaban -Continue metoprolol succinate -hold diltiazem due to bradycardia   Seizure disorder -Continue lamotrigine, Keppra, Vimpat   Tobacco abuse -Tobacco cessation discussed   Thrombocytopenia -Likely secondary to chronic hepatitis C -Monitor for signs of bleeding   Chronic hepatitis C -Appears clinically compensated           Status is: Inpatient   The patient will require care spanning > 2 midnights and should be moved to inpatient because: need for IV abx             Family Communication:   caregiver updated 11/3   Consultants:  surgery   Code Status:  FULL    DVT Prophylaxis:  [place]ellen Lovenox     Procedures: As Listed in Progress Note Above   Antibiotics: Vanco 11/2>>11/4 Cubicin 11/4>> Ceftriaxone 11/3>> Cefepime 11/2        Subjective: Patient denies fevers, chills, headache, chest pain, dyspnea, nausea, vomiting, diarrhea, abdominal pain, dysuria, hematuria, hematochezia, and melena.   Objective: Vitals:   01/22/21 2154 01/23/21 0521 01/23/21 0900 01/23/21 1334  BP: 121/67 113/77 111/89 (!) 114/101  Pulse: (!) 51 (!) 42 62 97  Resp: _0 Temp:  97.7 F (36.5 C)  98.5 F (36.9 C)  TempSrc:  Oral    SpO2: 99% 100%  100%  Weight:      Height:        Intake/Output Summary (Last 24 hours) at  01/23/2021 1758 Last data filed at 01/23/2021 1400 Gross per 24 hour  Intake 804 ml  Output --  Net 804 ml   Weight change:  Exam:  General:  Pt is alert, follows commands appropriately, not in acute distress HEENT: No icterus, No thrush, No neck mass, [place]/AT Cardiovascular: IRRR, S1/S2, no rubs, no gallops Respiratory: bibasilar rales. No wheeze Abdomen: Soft/+BS, non tender, non distended, no  guarding Extremities: mild edema left second toe.  No crepitance.   Data Reviewed: I have personally reviewed following labs and imaging studies Basic Metabolic Panel: Recent Labs  Lab 01/20/21 1228 01/21/21 0543 01/23/21 0437  NA 141 141 138  K 3.9 4.2 4.3  CL 109 111 109  CO2 _0 GLUCOSE 80 84 92  BUN _1 CREATININE 1.04 0.95 0.86  CALCIUM 9.0 8.8* 8.6*   Liver Function Tests: No results for input(s): AST, ALT, ALKPHOS, BILITOT, PROT, ALBUMIN in the last 168 hours. No results for input(s): LIPASE, AMYLASE in the last 168 hours. No results for input(s): AMMONIA in the last 168 hours. Coagulation Profile: No results for input(s): INR, PROTIME in the last 168 hours. CBC: Recent Labs  Lab 01/20/21 1228 01/21/21 0543  WBC 7.1 8.3  NEUTROABS 1.7  --   HGB 14.9 14.0  HCT 44.6 42.9  MCV 88.3 89.9  PLT 124* 120*   Cardiac Enzymes: Recent Labs  Lab 01/23/21 0437  CKTOTAL 55   BNP: Invalid input(s): POCBNP CBG: No results for input(s): GLUCAP in the last 168 hours. HbA1C: No results for input(s): HGBA1C in the last 72 hours. Urine analysis:    Component Value Date/Time   COLORURINE YELLOW 12/12/2017 [place] 12/12/2017 0931   LABSPEC 1.023 12/12/2017 0931   PHURINE 6.0 12/12/2017 0931   GLUCOSEU NEGATIVE 12/12/2017 0931   HGBUR NEGATIVE 12/12/2017 0931   BILIRUBINUR NEGATIVE 12/12/2017 0931   BILIRUBINUR [place] 10/19/2015 1526   KETONESUR NEGATIVE 12/12/2017 0931   PROTEINUR NEGATIVE 12/12/2017 0931   UROBILINOGEN >=8.0 10/19/2015 1526   UROBILINOGEN 0.2 10/31/2014 1840   NITRITE NEGATIVE 12/12/2017 0931   LEUKOCYTESUR NEGATIVE 12/12/2017 0931   Sepsis Labs: _2 (procalcitonin:4,lacticidven:4) ) Recent Results (from the past 240 hour(s))  Resp Panel by RT-PCR (Flu A&B, Covid) Nasopharyngeal Swab     Status: None   Collection Time: 01/20/21  1:43 PM   Specimen: Nasopharyngeal Swab; Nasopharyngeal(NP) swabs in vial  transport medium  Result Value Ref Range Status   SARS Coronavirus 2 by RT PCR NEGATIVE NEGATIVE Final    Comment: (NOTE) SARS-CoV-2 target nucleic acids are NOT DETECTED.  The SARS-CoV-2 RNA is generally detectable in upper respiratory specimens during the acute phase of infection. The lowest concentration of SARS-CoV-2 viral copies this assay can detect is 138 copies/mL. A negative result does not preclude SARS-Cov-2 infection and should not be used as the sole basis for treatment or other patient management decisions. A negative result may occur with  improper specimen collection/handling, submission of specimen other than nasopharyngeal swab, presence of viral mutation(s) within the areas targeted by this assay, and inadequate number of viral copies(<138 copies/mL). A negative result must be combined with clinical observations, patient history, and epidemiological information. The expected result is Negative.  Fact Sheet for Patients:  EntrepreneurPulse.com.au  Fact Sheet for Healthcare Providers:  IncredibleEmployment.be  This test is no t yet approved or cleared by the [place] FDA and  has been authorized for detection and/or diagnosis of SARS-CoV-2 by FDA under  an Emergency Use Authorization (EUA). This EUA will remain  in effect (meaning this test can be used) for the duration of the COVID-19 declaration under Section 564(b)(1) of the Act, 21 U.S.C.section 360bbb-3(b)(1), unless the authorization is terminated  or revoked sooner.       Influenza A by PCR NEGATIVE NEGATIVE Final   Influenza B by PCR NEGATIVE NEGATIVE Final    Comment: (NOTE) The Xpert Xpress SARS-CoV-2/FLU/RSV plus assay is intended as an aid in the diagnosis of influenza from Nasopharyngeal swab specimens and should not be used as a sole basis for treatment. Nasal washings and aspirates are unacceptable for Xpert Xpress SARS-CoV-2/FLU/RSV testing.  Fact  Sheet for Patients: EntrepreneurPulse.com.au  Fact Sheet for Healthcare Providers: IncredibleEmployment.be  This test is not yet approved or cleared by the [place] FDA and has been authorized for detection and/or diagnosis of SARS-CoV-2 by FDA under an Emergency Use Authorization (EUA). This EUA will remain in effect (meaning this test can be used) for the duration of the COVID-19 declaration under Section 564(b)(1) of the Act, 21 U.S.C. section 360bbb-3(b)(1), unless the authorization is terminated or revoked.  Performed at [facility], [address] [place], [place] [address]      Scheduled Meds:  Chlorhexidine Gluconate Cloth  6 each Topical Daily   enoxaparin (LOVENOX) injection  40 mg Subcutaneous Q24H   hydrOXYzine  25 mg Oral BID   lacosamide  150 mg Oral BID   lamoTRIgine  25 mg Oral Daily   levETIRAcetam  1,500 mg Oral BID   metoprolol succinate  25 mg Oral Daily   nicotine  14 mg Transdermal Daily   QUEtiapine  50 mg Oral BID   And   QUEtiapine  200 mg Oral QHS   sodium chloride flush  10-40 mL Intracatheter Q12H   Continuous Infusions:  cefTRIAXone (ROCEPHIN)  IV 2 g (01/23/21 1444)   DAPTOmycin (CUBICIN)  IV 700 mg (01/22/21 1639)    Procedures/Studies: MR FOOT LEFT W WO CONTRAST  Result Date: 01/21/2021 CLINICAL DATA:  Cellulitis of the left second toe EXAM: MRI OF THE LEFT FOREFOOT WITHOUT AND WITH CONTRAST TECHNIQUE: Multiplanar, multisequence MR imaging of the left forefoot was performed both before and after administration of intravenous contrast. CONTRAST:  3m GADAVIST GADOBUTROL 1 MMOL/ML IV SOLN COMPARISON:  01/20/2021 FINDINGS: Bones/Joint/Cartilage Diffuse edema signal and enhancement in the middle and distal phalanges of the second toe compatible with active osteomyelitis. There is deformity of the distal phalanx of the great toe with scalloping of the tuft but with little in the way of edema. This may be the  result of chronic deformity or remote osteomyelitis but we do not demonstrate striking edema or enhancement in the remaining distal phalanx a thing this less likely to be from acute osteomyelitis. There is some thinning of the overlying soft tissues of the distal toe. Correlate patient history. 5 mm degenerative subcortical cystic lesion or geode in the proximal navicular. Ligaments Lisfranc ligament intact Muscles and Tendons Regional muscular atrophy Soft tissues Subcutaneous edema and enhancement in the second toe compatible with cellulitis. Dorsal subcutaneous edema in the forefoot is not a complete by enhancement and accordingly is not necessarily infectious. IMPRESSION: 1. Active osteomyelitis in the middle and distal phalanges of the second toe with surrounding cellulitis. 2. Deformity of the distal phalanx of the great toe but without abnormal edema or enhancement either in the distal phalanx or in the surrounding soft tissues. Accordingly this is more likely to represent deformity from  an old/chronic process rather than acute osteomyelitis. Electronically Signed   By: Van Clines M.D.   On: 01/21/2021 06:45   US ARTERIAL ABI (SCREENING LOWER EXTREMITY)  Result Date: 01/21/2021 CLINICAL DATA:  Hypertension Current smoker Left second toe cellulitis Left breast pain EXAM: NONINVASIVE PHYSIOLOGIC VASCULAR STUDY OF BILATERAL LOWER EXTREMITIES TECHNIQUE: Evaluation of both lower extremities were performed at rest, including calculation of ankle-brachial indices with single level Doppler, pressure and pulse volume recording. COMPARISON:  None. FINDINGS: Right ABI:  1.10 Left ABI:  1.09 Right Lower Extremity: Monophasic waveform seen in the right posterior tibial and dorsalis pedis arteries. Left Lower Extremity: Monophasic waveform seen in the posterior tibial and dorsalis pedis arteries. 1.0-1.4 Normal IMPRESSION: Although lower extremity ABI values are within normal limits, monophasic waveforms seen  at the ankle bilaterally are suspicious for underlying arterial occlusive disease. Consider further evaluation with CT angiography of the abdominal aorta with runoff. Electronically Signed   By: Miachel Roux M.D.   On: 01/21/2021 11:43   DG Foot Complete Left  Result Date: 01/20/2021 CLINICAL DATA:  Second left toe wound EXAM: LEFT FOOT - COMPLETE 3+ VIEW COMPARISON:  None. FINDINGS: Soft tissue swelling of the distal second digit with erosion of the underlying middle and distal phalanx consistent with osteomyelitis. Erosive changes also noted in the distal tip of the distal phalanx of the great toe with overlying soft tissue abnormality suspicious for chronic osteomyelitis. Atherosclerotic changes seen throughout visualized arterial segments. IMPRESSION: Findings suspicious for osteomyelitis of the tip of the second and first toes. Electronically Signed   By: Miachel Roux M.D.   On: 01/20/2021 11:47   [place] EKG SITE RITE  Result Date: 01/21/2021 If Site Rite image not attached, placement could not be confirmed due to current cardiac rhythm.   Orson Eva, DO  [facility]  If 7PM-7AM, please contact night-coverage [email or web address] Password [email or web address] 01/23/2021, 5:58 PM   LOS: 2 days

## 2021-01-23 NOTE — Plan of Care (Signed)
  Problem: Health Behavior/Discharge Planning: Goal: Ability to manage health-related needs will improve Outcome: Progressing   Problem: Activity: Goal: Risk for activity intolerance will decrease Outcome: Progressing   Problem: Coping: Goal: Level of anxiety will decrease Outcome: Progressing   

## 2021-01-24 DIAGNOSIS — M86179 Other acute osteomyelitis, unspecified ankle and foot: Secondary | ICD-10-CM | POA: Diagnosis not present

## 2021-01-24 DIAGNOSIS — F17200 Nicotine dependence, unspecified, uncomplicated: Secondary | ICD-10-CM | POA: Diagnosis not present

## 2021-01-24 DIAGNOSIS — M869 Osteomyelitis, unspecified: Secondary | ICD-10-CM | POA: Diagnosis not present

## 2021-01-24 MED ORDER — DAPTOMYCIN 500 MG IV SOLR
700.0000 mg | Freq: Every day | INTRAVENOUS | Status: DC
Start: 2021-01-24 — End: 2021-02-21

## 2021-01-24 MED ORDER — APIXABAN 5 MG PO TABS
5.0000 mg | ORAL_TABLET | Freq: Two times a day (BID) | ORAL | Status: DC
  Administered 2021-01-24 – 2021-03-04 (×78): 5 mg via ORAL
  Filled 2021-01-24 (×78): qty 1

## 2021-01-24 MED ORDER — SODIUM CHLORIDE 0.9 % IV SOLN: 2.0000 g | INTRAVENOUS | Status: DC

## 2021-01-24 MED ORDER — DAPTOMYCIN 500 MG IV SOLR
INTRAVENOUS | Status: AC
  Filled 2021-01-24: qty 20

## 2021-01-24 NOTE — Progress Notes (Signed)
PROGRESS NOTE  [name] [name] JOA:[id number] DOB: 1960/09/18 DOA: 01/20/2021 PCP: [name], NP   Brief History:   60 y.o. male with medical history of MDD with psychosis, seizure disorder, hypertension, tobacco abuse, cognitive impairment, atrial flutter with RVR, chronic hepatitis C, history of cocaine and alcohol abuse presenting due to abnormality with his left second toe.  The patient is a poor historian.  When asked why he is in the emergency department, he states " they tell me there is something wrong with my foot".  Apparently, the patient was getting his toenails trimmed at his assisted living facility [facility]) when it was noticed there was some abnormality and discoloration of his left second toe.  As result, the patient was sent to the emergency department for further evaluation.  The patient himself denies any pain or discomfort about his second toe.  He is unable to provide any history regarding recent injury or trauma.  He states, " I did not there was something wrong with my toe."  He denies any fevers, chills, chest pain, shortness breath, cough, hemoptysis, nausea, vomiting, diarrhea, domino pain, foot pain.  He continues to smoke tobacco.   ED In the emergency department, the patient was afebrile hemodynamically stable with oxygen saturation 100% room air.  BMP showed a sodium 141, potassium 3.9, serum creatinine 1.04.  WBC 7.1, hemoglobin 14.9, platelets 124,000.  Patient was given a dose of vancomycin.  X-ray of the left foot showed soft tissue swelling with erosion of the distal and middle phalanx of the left second toe.  Admission was requested for further work-up and treatment.   Assessment/Plan: Cellulitis left second toe/Acute osteomyelitis -ESR--5 -CRP--0.7 -Check ABIs--left = 1.10; right = 1.09 -Patient has palpable dorsalis pedis pulses bilateral -Surgery consult noted -Continue vancomycin>>changed to daptomycin -changed to ceftriaxone -MR left  foot--acute osteomyelitis middle and distal phalanges 2nd toe -PICC line placed 11/3 -plan 4 weeks IV abx--last day 02/17/21   Bipolar disorder -Continue quetiapine, sertraline, trazodone -Continue hydroxyzine  Atrial flutter -Continue apixaban -Continue metoprolol succinate -hold diltiazem due to bradycardia   Seizure disorder -Continue lamotrigine, Keppra, Vimpat   Tobacco abuse -Tobacco cessation discussed   Thrombocytopenia -Likely secondary to chronic hepatitis C -Monitor for signs of bleeding   Chronic hepatitis C -Appears clinically compensated           Status is: Inpatient   The patient will require care spanning > 2 midnights and should be moved to inpatient because: need for IV abx             Family Communication:   caregiver updated 11/3   Consultants:  surgery   Code Status:  FULL    DVT Prophylaxis:  [place] Lovenox     Procedures: As Listed in Progress Note Above   Antibiotics: Vanco 11/2>>11/4 Cubicin 11/4>> Ceftriaxone 11/3>> Cefepime 11/2     Subjective: Patient denies fevers, chills, headache, chest pain, dyspnea, nausea, vomiting, diarrhea, abdominal pain, dysuria, hematuria, hematochezia, and melena.   Objective: Vitals:   01/23/21 1334 01/23/21 2144 01/24/21 0500 01/24/21 1344  BP: (!) 114/101 106/68 121/68 122/77  Pulse: 97 (!) 110 98 100  Resp: 18 18 18 18   Temp: 98.5 F (36.9 C) 98.4 F (36.9 C) 98.2 F (36.8 C) 97.8 F (36.6 C)  TempSrc:      SpO2: 100% 98% 99% 94%  Weight:      Height:        Intake/Output Summary (Last  24 hours) at 01/24/2021 1543 Last data filed at 01/24/2021 1330 Gross per 24 hour  Intake 1680 ml  Output --  Net 1680 ml   Weight change:  Exam:  General:  Pt is alert, follows commands appropriately, not in acute distress HEENT: No icterus, No thrush, No neck mass, [place]/AT Cardiovascular: RRR, S1/S2, no rubs, no gallops Respiratory: CTA bilaterally, no wheezing, no crackles, no  rhonchi Abdomen: Soft/+BS, non tender, non distended, no guarding Extremities: left second toe with mild edema.  No pus.  No crepitance.   Data Reviewed: I have personally reviewed following labs and imaging studies Basic Metabolic Panel: Recent Labs  Lab 01/20/21 1228 01/21/21 0543 01/23/21 0437  NA 141 141 138  K 3.9 4.2 4.3  CL 109 111 109  CO2 26 23 24   GLUCOSE 80 84 92  BUN 19 22 12   CREATININE 1.04 0.95 0.86  CALCIUM 9.0 8.8* 8.6*   Liver Function Tests: No results for input(s): AST, ALT, ALKPHOS, BILITOT, PROT, ALBUMIN in the last 168 hours. No results for input(s): LIPASE, AMYLASE in the last 168 hours. No results for input(s): AMMONIA in the last 168 hours. Coagulation Profile: No results for input(s): INR, PROTIME in the last 168 hours. CBC: Recent Labs  Lab 01/20/21 1228 01/21/21 0543  WBC 7.1 8.3  NEUTROABS 1.7  --   HGB 14.9 14.0  HCT 44.6 42.9  MCV 88.3 89.9  PLT 124* 120*   Cardiac Enzymes: Recent Labs  Lab 01/23/21 0437  CKTOTAL 55   BNP: Invalid input(s): POCBNP CBG: No results for input(s): GLUCAP in the last 168 hours. HbA1C: No results for input(s): HGBA1C in the last 72 hours. Urine analysis:    Component Value Date/Time   COLORURINE YELLOW 12/12/2017 [place] 12/12/2017 0931   LABSPEC 1.023 12/12/2017 0931   PHURINE 6.0 12/12/2017 0931   GLUCOSEU NEGATIVE 12/12/2017 0931   HGBUR NEGATIVE 12/12/2017 0931   BILIRUBINUR NEGATIVE 12/12/2017 0931   BILIRUBINUR [place] 10/19/2015 1526   KETONESUR NEGATIVE 12/12/2017 0931   PROTEINUR NEGATIVE 12/12/2017 0931   UROBILINOGEN >=8.0 10/19/2015 1526   UROBILINOGEN 0.2 10/31/2014 1840   NITRITE NEGATIVE 12/12/2017 0931   LEUKOCYTESUR NEGATIVE 12/12/2017 0931   Sepsis Labs: @LABRCNTIP (procalcitonin:4,lacticidven:4) ) Recent Results (from the past 240 hour(s))  Resp Panel by RT-PCR (Flu A&B, Covid) Nasopharyngeal Swab     Status: None   Collection Time: 01/20/21  1:43 PM    Specimen: Nasopharyngeal Swab; Nasopharyngeal(NP) swabs in vial transport medium  Result Value Ref Range Status   SARS Coronavirus 2 by RT PCR NEGATIVE NEGATIVE Final    Comment: (NOTE) SARS-CoV-2 target nucleic acids are NOT DETECTED.  The SARS-CoV-2 RNA is generally detectable in upper respiratory specimens during the acute phase of infection. The lowest concentration of SARS-CoV-2 viral copies this assay can detect is 138 copies/mL. A negative result does not preclude SARS-Cov-2 infection and should not be used as the sole basis for treatment or other patient management decisions. A negative result may occur with  improper specimen collection/handling, submission of specimen other than nasopharyngeal swab, presence of viral mutation(s) within the areas targeted by this assay, and inadequate number of viral copies(<138 copies/mL). A negative result must be combined with clinical observations, patient history, and epidemiological information. The expected result is Negative.  Fact Sheet for Patients:  EntrepreneurPulse.com.au  Fact Sheet for Healthcare Providers:  IncredibleEmployment.be  This test is no t yet approved or cleared by the [place] FDA and  has  been authorized for detection and/or diagnosis of SARS-CoV-2 by FDA under an Emergency Use Authorization (EUA). This EUA will remain  in effect (meaning this test can be used) for the duration of the COVID-19 declaration under Section 564(b)(1) of the Act, 21 U.S.C.section 360bbb-3(b)(1), unless the authorization is terminated  or revoked sooner.       Influenza A by PCR NEGATIVE NEGATIVE Final   Influenza B by PCR NEGATIVE NEGATIVE Final    Comment: (NOTE) The Xpert Xpress SARS-CoV-2/FLU/RSV plus assay is intended as an aid in the diagnosis of influenza from Nasopharyngeal swab specimens and should not be used as a sole basis for treatment. Nasal washings and aspirates are  unacceptable for Xpert Xpress SARS-CoV-2/FLU/RSV testing.  Fact Sheet for Patients: EntrepreneurPulse.com.au  Fact Sheet for Healthcare Providers: IncredibleEmployment.be  This test is not yet approved or cleared by the [place] FDA and has been authorized for detection and/or diagnosis of SARS-CoV-2 by FDA under an Emergency Use Authorization (EUA). This EUA will remain in effect (meaning this test can be used) for the duration of the COVID-19 declaration under Section 564(b)(1) of the Act, 21 U.S.C. section 360bbb-3(b)(1), unless the authorization is terminated or revoked.  Performed at [facility], [address], [place], [place] [address]      Scheduled Meds:  Chlorhexidine Gluconate Cloth  6 each Topical Daily   enoxaparin (LOVENOX) injection  40 mg Subcutaneous Q24H   hydrOXYzine  25 mg Oral BID   lacosamide  150 mg Oral BID   lamoTRIgine  25 mg Oral Daily   levETIRAcetam  1,500 mg Oral BID   metoprolol succinate  25 mg Oral Daily   nicotine  14 mg Transdermal Daily   QUEtiapine  50 mg Oral BID   And   QUEtiapine  200 mg Oral QHS   sodium chloride flush  10-40 mL Intracatheter Q12H   Continuous Infusions:  cefTRIAXone (ROCEPHIN)  IV 2 g (01/23/21 1444)   DAPTOmycin (CUBICIN)  IV 700 mg (01/23/21 2028)    Procedures/Studies: MR FOOT LEFT W WO CONTRAST  Result Date: 01/21/2021 CLINICAL DATA:  Cellulitis of the left second toe EXAM: MRI OF THE LEFT FOREFOOT WITHOUT AND WITH CONTRAST TECHNIQUE: Multiplanar, multisequence MR imaging of the left forefoot was performed both before and after administration of intravenous contrast. CONTRAST:  54m GADAVIST GADOBUTROL 1 MMOL/ML IV SOLN COMPARISON:  01/20/2021 FINDINGS: Bones/Joint/Cartilage Diffuse edema signal and enhancement in the middle and distal phalanges of the second toe compatible with active osteomyelitis. There is deformity of the distal phalanx of the great toe with  scalloping of the tuft but with little in the way of edema. This may be the result of chronic deformity or remote osteomyelitis but we do not demonstrate striking edema or enhancement in the remaining distal phalanx a thing this less likely to be from acute osteomyelitis. There is some thinning of the overlying soft tissues of the distal toe. Correlate patient history. 5 mm degenerative subcortical cystic lesion or geode in the proximal navicular. Ligaments Lisfranc ligament intact Muscles and Tendons Regional muscular atrophy Soft tissues Subcutaneous edema and enhancement in the second toe compatible with cellulitis. Dorsal subcutaneous edema in the forefoot is not a complete by enhancement and accordingly is not necessarily infectious. IMPRESSION: 1. Active osteomyelitis in the middle and distal phalanges of the second toe with surrounding cellulitis. 2. Deformity of the distal phalanx of the great toe but without abnormal edema or enhancement either in the distal phalanx or in the surrounding  soft tissues. Accordingly this is more likely to represent deformity from an old/chronic process rather than acute osteomyelitis. Electronically Signed   By: Van Clines M.D.   On: 01/21/2021 06:45   US ARTERIAL ABI (SCREENING LOWER EXTREMITY)  Result Date: 01/21/2021 CLINICAL DATA:  Hypertension Current smoker Left second toe cellulitis Left breast pain EXAM: NONINVASIVE PHYSIOLOGIC VASCULAR STUDY OF BILATERAL LOWER EXTREMITIES TECHNIQUE: Evaluation of both lower extremities were performed at rest, including calculation of ankle-brachial indices with single level Doppler, pressure and pulse volume recording. COMPARISON:  None. FINDINGS: Right ABI:  1.10 Left ABI:  1.09 Right Lower Extremity: Monophasic waveform seen in the right posterior tibial and dorsalis pedis arteries. Left Lower Extremity: Monophasic waveform seen in the posterior tibial and dorsalis pedis arteries. 1.0-1.4 Normal IMPRESSION: Although  lower extremity ABI values are within normal limits, monophasic waveforms seen at the ankle bilaterally are suspicious for underlying arterial occlusive disease. Consider further evaluation with CT angiography of the abdominal aorta with runoff. Electronically Signed   By: Miachel Roux M.D.   On: 01/21/2021 11:43   DG Foot Complete Left  Result Date: 01/20/2021 CLINICAL DATA:  Second left toe wound EXAM: LEFT FOOT - COMPLETE 3+ VIEW COMPARISON:  None. FINDINGS: Soft tissue swelling of the distal second digit with erosion of the underlying middle and distal phalanx consistent with osteomyelitis. Erosive changes also noted in the distal tip of the distal phalanx of the great toe with overlying soft tissue abnormality suspicious for chronic osteomyelitis. Atherosclerotic changes seen throughout visualized arterial segments. IMPRESSION: Findings suspicious for osteomyelitis of the tip of the second and first toes. Electronically Signed   By: Miachel Roux M.D.   On: 01/20/2021 11:47   [place] EKG SITE RITE  Result Date: 01/21/2021 If Site Rite image not attached, placement could not be confirmed due to current cardiac rhythm.   Orson Eva, DO  [facility]  If 7PM-7AM, please contact night-coverage [email or web address] Password [email or web address] 01/24/2021, 3:43 PM   LOS: 3 days

## 2021-01-24 NOTE — TOC Progression Note (Signed)
Transition of Care [facility]) - Progression Note    Patient Details  Name: [name] [name] MRN: [id number] Date of Birth: 1960-10-15  Transition of Care [facility]) CM/SW Contact  Barry Brunner, LCSW Phone Number: 01/24/2021, 11:06 AM  Clinical Narrative:    CSW followed up with Eunice Blase from [facility] to inquire if [employer] has approved patient. Debbie reported that they have not yet approved patient and reported that she follow up with Chessie at [employer] Monday. TOC to follow.    Expected Discharge Plan: Skilled Nursing Facility Barriers to Discharge: Continued Medical Work up  Expected Discharge Plan and Services Expected Discharge Plan: Skilled Nursing Facility       Living arrangements for the past 2 months: Assisted Living Facility                                       Social Determinants of Health (SDOH) Interventions    Readmission Risk Interventions Readmission Risk Prevention Plan 01/22/2021  Transportation Screening Complete  HRI or Home Care Consult Complete  Social Work Consult for Recovery Care Planning/Counseling Complete  Palliative Care Screening Not Applicable  Medication Review [employer]) Complete  Some recent data might be hidden

## 2021-01-24 NOTE — Discharge Summary (Signed)
Physician Discharge Summary  [name] [name] PFX:[id number] DOB: Jul 15, 1960 DOA: 01/20/2021  PCP: [name], NP  Admit date: 01/20/2021 Discharge date: 01/26/2021  Admitted From: [name] Disposition:  SNF  Recommendations for Outpatient Follow-up:  Follow up with PCP in 1-2 weeks Please obtain CK, BMP/CBC every 7 days, startin 01/28/21 Remove PICC line after last day of antibiotics on 02/17/21 -doxycycline 100 mg bid x 14 days AFTER IV antibiotics finished -amox/clav 875/125 mg bid x 14 days AFTER IV antibiotics are dose    Equipment/Devices:PICC line   Discharge Condition: Stable CODE STATUS:FULL Diet recommendation: Heart Healthy   Brief/Interim Summary:  60 y.o. male with medical history of MDD with psychosis, seizure disorder, hypertension, tobacco abuse, cognitive impairment, atrial flutter with RVR, chronic hepatitis C, history of cocaine and alcohol abuse presenting due to abnormality with his left second toe.  The patient is a poor historian.  When asked why he is in the emergency department, he states " they tell me there is something wrong with my foot".  Apparently, the patient was getting his toenails trimmed at his assisted living facility [facility]) when it was noticed there was some abnormality and discoloration of his left second toe.  As result, the patient was sent to the emergency department for further evaluation.  The patient himself denies any pain or discomfort about his second toe.  He is unable to provide any history regarding recent injury or trauma.  He states, " I did not there was something wrong with my toe."  He denies any fevers, chills, chest pain, shortness breath, cough, hemoptysis, nausea, vomiting, diarrhea, domino pain, foot pain.  He continues to smoke tobacco.   ED In the emergency department, the patient was afebrile hemodynamically stable with oxygen saturation 100% room air.  BMP showed a sodium 141, potassium 3.9, serum creatinine 1.04.  WBC  7.1, hemoglobin 14.9, platelets 124,000.  Patient was given a dose of vancomycin.  X-ray of the left foot showed soft tissue swelling with erosion of the distal and middle phalanx of the left second toe.  Admission was requested for further work-up and treatment.    Discharge Diagnoses:   Cellulitis left second toe/Acute osteomyelitis -ESR--5 -CRP--0.7 -Check ABIs--left = 1.10; right = 1.09 -Patient has palpable dorsalis pedis pulses bilateral -Surgery consult noted -Continue vancomycin>>changed to daptomycin -changed to ceftriaxone -MR left foot--acute osteomyelitis middle and distal phalanges 2nd toe -PICC line placed 11/3 -plan 4 weeks IV abx--last day 02/17/21 -Check BMP, CBC, CK every 7 days, first on 01/28/21 -remove PICC after last day of IV antibiotics -doxycycline 100 mg bid x 14 days AFTER IV antibiotics finished -amox/clav 875/125 mg bid x 14 days AFTER IV antibiotics are dose See pics of left foot from 11/8 below   Bipolar disorder -Continue quetiapine, sertraline, trazodone -Continue hydroxyzine  Atrial flutter -Continue apixaban -Continue metoprolol succinate -hold diltiazem due to bradycardia   Seizure disorder -Continue lamotrigine, Keppra, Vimpat   Tobacco abuse -Tobacco cessation discussed   Thrombocytopenia -Likely secondary to chronic hepatitis C -Monitor for signs of bleeding   Chronic hepatitis C -Appears clinically compensated      Discharge Instructions   Allergies as of 01/24/2021       Reactions   Carbamazepine Other (See Comments)   Hyponatremia   Depakote [divalproex Sodium] Other (See Comments)   Unknown - on MAR        Medication List     STOP taking these medications    diltiazem 90 MG tablet Commonly known as:  CARDIZEM       TAKE these medications    apixaban 5 MG Tabs tablet Commonly known as: ELIQUIS Take 5 mg by mouth 2 (two) times daily.   cefTRIAXone 2 g in sodium chloride 0.9 % 100 mL Inject 2 g into  the vein daily. Last dose on 02/17/21 Start taking on: January 25, 2021   cholecalciferol 25 MCG (1000 UNIT) tablet Commonly known as: VITAMIN D Take 1,000 Units by mouth daily.   DAPTOmycin 700 mg in sodium chloride 0.9 % 50 mL Inject 700 mg into the vein daily at 8 pm. Last dose on 02/17/21   hydrOXYzine 25 MG capsule Commonly known as: VISTARIL Take 25 mg by mouth in the morning and at bedtime.   hydrOXYzine 50 MG capsule Commonly known as: VISTARIL Take 100 mg by mouth daily as needed (agitation).   lamoTRIgine 25 MG tablet Commonly known as: LAMICTAL Take 25 mg by mouth daily.   Latanoprost 0.005 % Emul Place 1 drop into both eyes at bedtime.   levETIRAcetam 750 MG tablet Commonly known as: KEPPRA Take 2 tablets (1,500 mg total) by mouth 2 (two) times daily.   loperamide 2 MG tablet Commonly known as: IMODIUM A-D Take 2-4 mg by mouth See admin instructions. 26m after 1st loose stool then 268mevery 6 hours as needed for diarrhea   metoprolol succinate 25 MG 24 hr tablet Commonly known as: TOPROL-XL Take 25 mg by mouth daily.   ondansetron 4 MG tablet Commonly known as: ZOFRAN Take 4 mg by mouth every 8 (eight) hours as needed for nausea.   QUEtiapine 200 MG tablet Commonly known as: SEROQUEL Take 200 mg by mouth at bedtime.   QUEtiapine 50 MG tablet Commonly known as: SEROQUEL Take 50 mg by mouth 2 (two) times daily.   sertraline 50 MG tablet Commonly known as: ZOLOFT Take 50 mg by mouth daily.   SUMAtriptan 50 MG tablet Commonly known as: IMITREX Take 1 tablet (50 mg total) by mouth See admin instructions. Give 1 tablet (50 mg) by mouth every 24 hours as needed for migraine headache;  May repeat in 2 hours if headache persists or recurs. Do not exceed 100 mg in 24 hours What changed:  when to take this reasons to take this additional instructions   traZODone 150 MG tablet Commonly known as: DESYREL Take 150 mg by mouth at bedtime.   Vimpat 150 MG  Tabs Generic drug: Lacosamide Take 1 tablet (150 mg total) by mouth 2 (two) times daily.        Allergies  Allergen Reactions   Carbamazepine Other (See Comments)    Hyponatremia   Depakote [Divalproex Sodium] Other (See Comments)    Unknown - on MAR    Consultations: General surgery   Procedures/Studies: MR FOOT LEFT W WO CONTRAST  Result Date: 01/21/2021 CLINICAL DATA:  Cellulitis of the left second toe EXAM: MRI OF THE LEFT FOREFOOT WITHOUT AND WITH CONTRAST TECHNIQUE: Multiplanar, multisequence MR imaging of the left forefoot was performed both before and after administration of intravenous contrast. CONTRAST:  1064mADAVIST GADOBUTROL 1 MMOL/ML IV SOLN COMPARISON:  01/20/2021 FINDINGS: Bones/Joint/Cartilage Diffuse edema signal and enhancement in the middle and distal phalanges of the second toe compatible with active osteomyelitis. There is deformity of the distal phalanx of the great toe with scalloping of the tuft but with little in the way of edema. This may be the result of chronic deformity or remote osteomyelitis but we do not demonstrate striking edema  or enhancement in the remaining distal phalanx a thing this less likely to be from acute osteomyelitis. There is some thinning of the overlying soft tissues of the distal toe. Correlate patient history. 5 mm degenerative subcortical cystic lesion or geode in the proximal navicular. Ligaments Lisfranc ligament intact Muscles and Tendons Regional muscular atrophy Soft tissues Subcutaneous edema and enhancement in the second toe compatible with cellulitis. Dorsal subcutaneous edema in the forefoot is not a complete by enhancement and accordingly is not necessarily infectious. IMPRESSION: 1. Active osteomyelitis in the middle and distal phalanges of the second toe with surrounding cellulitis. 2. Deformity of the distal phalanx of the great toe but without abnormal edema or enhancement either in the distal phalanx or in the surrounding  soft tissues. Accordingly this is more likely to represent deformity from an old/chronic process rather than acute osteomyelitis. Electronically Signed   By: Van Clines M.D.   On: 01/21/2021 06:45   US ARTERIAL ABI (SCREENING LOWER EXTREMITY)  Result Date: 01/21/2021 CLINICAL DATA:  Hypertension Current smoker Left second toe cellulitis Left breast pain EXAM: NONINVASIVE PHYSIOLOGIC VASCULAR STUDY OF BILATERAL LOWER EXTREMITIES TECHNIQUE: Evaluation of both lower extremities were performed at rest, including calculation of ankle-brachial indices with single level Doppler, pressure and pulse volume recording. COMPARISON:  None. FINDINGS: Right ABI:  1.10 Left ABI:  1.09 Right Lower Extremity: Monophasic waveform seen in the right posterior tibial and dorsalis pedis arteries. Left Lower Extremity: Monophasic waveform seen in the posterior tibial and dorsalis pedis arteries. 1.0-1.4 Normal IMPRESSION: Although lower extremity ABI values are within normal limits, monophasic waveforms seen at the ankle bilaterally are suspicious for underlying arterial occlusive disease. Consider further evaluation with CT angiography of the abdominal aorta with runoff. Electronically Signed   By: Miachel Roux M.D.   On: 01/21/2021 11:43   DG Foot Complete Left  Result Date: 01/20/2021 CLINICAL DATA:  Second left toe wound EXAM: LEFT FOOT - COMPLETE 3+ VIEW COMPARISON:  None. FINDINGS: Soft tissue swelling of the distal second digit with erosion of the underlying middle and distal phalanx consistent with osteomyelitis. Erosive changes also noted in the distal tip of the distal phalanx of the great toe with overlying soft tissue abnormality suspicious for chronic osteomyelitis. Atherosclerotic changes seen throughout visualized arterial segments. IMPRESSION: Findings suspicious for osteomyelitis of the tip of the second and first toes. Electronically Signed   By: Miachel Roux M.D.   On: 01/20/2021 11:47   [place] EKG SITE  RITE  Result Date: 01/21/2021 If Site Rite image not attached, placement could not be confirmed due to current cardiac rhythm.       Discharge Exam: Vitals:   01/24/21 0500 01/24/21 1344  BP: 121/68 122/77  Pulse: 98 100  Resp: 18 18  Temp: 98.2 F (36.8 C) 97.8 F (36.6 C)  SpO2: 99% 94%   Vitals:   01/23/21 1334 01/23/21 2144 01/24/21 0500 01/24/21 1344  BP: (!) 114/101 106/68 121/68 122/77  Pulse: 97 (!) 110 98 100  Resp: _0 Temp: 98.5 F (36.9 C) 98.4 F (36.9 C) 98.2 F (36.8 C) 97.8 F (36.6 C)  TempSrc:      SpO2: 100% 98% 99% 94%  Weight:      Height:        General: Pt is alert, awake, not in acute distress Cardiovascular: RRR, S1/S2 +, no rubs, no gallops Respiratory: CTA bilaterally, no wheezing, no rhonchi Abdominal: Soft, NT, ND, bowel sounds + Extremities: no edema,  no cyanosis        The results of significant diagnostics from this hospitalization (including imaging, microbiology, ancillary and laboratory) are listed below for reference.    Significant Diagnostic Studies: MR FOOT LEFT W WO CONTRAST  Result Date: 01/21/2021 CLINICAL DATA:  Cellulitis of the left second toe EXAM: MRI OF THE LEFT FOREFOOT WITHOUT AND WITH CONTRAST TECHNIQUE: Multiplanar, multisequence MR imaging of the left forefoot was performed both before and after administration of intravenous contrast. CONTRAST:  19m GADAVIST GADOBUTROL 1 MMOL/ML IV SOLN COMPARISON:  01/20/2021 FINDINGS: Bones/Joint/Cartilage Diffuse edema signal and enhancement in the middle and distal phalanges of the second toe compatible with active osteomyelitis. There is deformity of the distal phalanx of the great toe with scalloping of the tuft but with little in the way of edema. This may be the result of chronic deformity or remote osteomyelitis but we do not demonstrate striking edema or enhancement in the remaining distal phalanx a thing this less likely to be from acute osteomyelitis. There  is some thinning of the overlying soft tissues of the distal toe. Correlate patient history. 5 mm degenerative subcortical cystic lesion or geode in the proximal navicular. Ligaments Lisfranc ligament intact Muscles and Tendons Regional muscular atrophy Soft tissues Subcutaneous edema and enhancement in the second toe compatible with cellulitis. Dorsal subcutaneous edema in the forefoot is not a complete by enhancement and accordingly is not necessarily infectious. IMPRESSION: 1. Active osteomyelitis in the middle and distal phalanges of the second toe with surrounding cellulitis. 2. Deformity of the distal phalanx of the great toe but without abnormal edema or enhancement either in the distal phalanx or in the surrounding soft tissues. Accordingly this is more likely to represent deformity from an old/chronic process rather than acute osteomyelitis. Electronically Signed   By: WVan ClinesM.D.   On: 01/21/2021 06:45   [place] ABI (SCREENING LOWER EXTREMITY)  Result Date: 01/21/2021 CLINICAL DATA:  Hypertension Current smoker Left second toe cellulitis Left breast pain EXAM: NONINVASIVE PHYSIOLOGIC VASCULAR STUDY OF BILATERAL LOWER EXTREMITIES TECHNIQUE: Evaluation of both lower extremities were performed at rest, including calculation of ankle-brachial indices with single level Doppler, pressure and pulse volume recording. COMPARISON:  None. FINDINGS: Right ABI:  1.10 Left ABI:  1.09 Right Lower Extremity: Monophasic waveform seen in the right posterior tibial and dorsalis pedis arteries. Left Lower Extremity: Monophasic waveform seen in the posterior tibial and dorsalis pedis arteries. 1.0-1.4 Normal IMPRESSION: Although lower extremity ABI values are within normal limits, monophasic waveforms seen at the ankle bilaterally are suspicious for underlying arterial occlusive disease. Consider further evaluation with CT angiography of the abdominal aorta with runoff. Electronically Signed   By: FMiachel RouxM.D.   On: 01/21/2021 11:43   DG Foot Complete Left  Result Date: 01/20/2021 CLINICAL DATA:  Second left toe wound EXAM: LEFT FOOT - COMPLETE 3+ VIEW COMPARISON:  None. FINDINGS: Soft tissue swelling of the distal second digit with erosion of the underlying middle and distal phalanx consistent with osteomyelitis. Erosive changes also noted in the distal tip of the distal phalanx of the great toe with overlying soft tissue abnormality suspicious for chronic osteomyelitis. Atherosclerotic changes seen throughout visualized arterial segments. IMPRESSION: Findings suspicious for osteomyelitis of the tip of the second and first toes. Electronically Signed   By: FMiachel RouxM.D.   On: 01/20/2021 11:47   [place] SITE R[place]  Result Date: 01/21/2021 If Site Rite image not attached, placement could not be confirmed due  to current cardiac rhythm.   Microbiology: Recent Results (from the past 240 hour(s))  Resp Panel by RT-PCR (Flu A&B, Covid) Nasopharyngeal Swab     Status: None   Collection Time: 01/20/21  1:43 PM   Specimen: Nasopharyngeal Swab; Nasopharyngeal(NP) swabs in vial transport medium  Result Value Ref Range Status   SARS Coronavirus 2 by RT PCR NEGATIVE NEGATIVE Final    Comment: (NOTE) SARS-CoV-2 target nucleic acids are NOT DETECTED.  The SARS-CoV-2 RNA is generally detectable in upper respiratory specimens during the acute phase of infection. The lowest concentration of SARS-CoV-2 viral copies this assay can detect is 138 copies/mL. A negative result does not preclude SARS-Cov-2 infection and should not be used as the sole basis for treatment or other patient management decisions. A negative result may occur with  improper specimen collection/handling, submission of specimen other than nasopharyngeal swab, presence of viral mutation(s) within the areas targeted by this assay, and inadequate number of viral copies(<138 copies/mL). A negative result must be combined with clinical  observations, patient history, and epidemiological information. The expected result is Negative.  Fact Sheet for Patients:  EntrepreneurPulse.com.au  Fact Sheet for Healthcare Providers:  IncredibleEmployment.be  This test is no t yet approved or cleared by the [place] FDA and  has been authorized for detection and/or diagnosis of SARS-CoV-2 by FDA under an Emergency Use Authorization (EUA). This EUA will remain  in effect (meaning this test can be used) for the duration of the COVID-19 declaration under Section 564(b)(1) of the Act, 21 U.S.C.section 360bbb-3(b)(1), unless the authorization is terminated  or revoked sooner.       Influenza A by PCR NEGATIVE NEGATIVE Final   Influenza B by PCR NEGATIVE NEGATIVE Final    Comment: (NOTE) The Xpert Xpress SARS-CoV-2/FLU/RSV plus assay is intended as an aid in the diagnosis of influenza from Nasopharyngeal swab specimens and should not be used as a sole basis for treatment. Nasal washings and aspirates are unacceptable for Xpert Xpress SARS-CoV-2/FLU/RSV testing.  Fact Sheet for Patients: EntrepreneurPulse.com.au  Fact Sheet for Healthcare Providers: IncredibleEmployment.be  This test is not yet approved or cleared by the [place] FDA and has been authorized for detection and/or diagnosis of SARS-CoV-2 by FDA under an Emergency Use Authorization (EUA). This EUA will remain in effect (meaning this test can be used) for the duration of the COVID-19 declaration under Section 564(b)(1) of the Act, 21 U.S.C. section 360bbb-3(b)(1), unless the authorization is terminated or revoked.  Performed at [facility], [address], [place], [place] [address]      Labs: Basic Metabolic Panel: Recent Labs  Lab 01/20/21 1228 01/21/21 0543 01/23/21 0437  NA 141 141 138  K 3.9 4.2 4.3  CL 109 111 109  CO2 _0 GLUCOSE 80 84 92  BUN _1 CREATININE 1.04 0.95 0.86  CALCIUM 9.0 8.8* 8.6*   Liver Function Tests: No results for input(s): AST, ALT, ALKPHOS, BILITOT, PROT, ALBUMIN in the last 168 hours. No results for input(s): LIPASE, AMYLASE in the last 168 hours. No results for input(s): AMMONIA in the last 168 hours. CBC: Recent Labs  Lab 01/20/21 1228 01/21/21 0543  WBC 7.1 8.3  NEUTROABS 1.7  --   HGB 14.9 14.0  HCT 44.6 42.9  MCV 88.3 89.9  PLT 124* 120*   Cardiac Enzymes: Recent Labs  Lab 01/23/21 0437  CKTOTAL 55   BNP: Invalid input(s): POCBNP CBG: No results for input(s): GLUCAP in the  last 168 hours.  Time coordinating discharge:  36 minutes  Signed:  Orson Eva, DO [facility] Pager: [phone] 01/24/2021, 4:17 PM

## 2021-01-25 DIAGNOSIS — F17200 Nicotine dependence, unspecified, uncomplicated: Secondary | ICD-10-CM | POA: Diagnosis not present

## 2021-01-25 DIAGNOSIS — M86179 Other acute osteomyelitis, unspecified ankle and foot: Secondary | ICD-10-CM | POA: Diagnosis not present

## 2021-01-25 MED ORDER — HALOPERIDOL LACTATE 5 MG/ML IJ SOLN
5.0000 mg | Freq: Four times a day (QID) | INTRAMUSCULAR | Status: DC | PRN
  Administered 2021-01-25: 5 mg via INTRAVENOUS
  Filled 2021-01-25: qty 1

## 2021-01-25 NOTE — Progress Notes (Signed)
PROGRESS NOTE  [name] [name] UCJ:[id number] DOB: 09-06-1960 DOA: 01/20/2021 PCP: [name], NP  Brief History:   60 y.o. male with medical history of MDD with psychosis, seizure disorder, hypertension, tobacco abuse, cognitive impairment, atrial flutter with RVR, chronic hepatitis C, history of cocaine and alcohol abuse presenting due to abnormality with his left second toe.  The patient is a poor historian.  When asked why he is in the emergency department, he states " they tell me there is something wrong with my foot".  Apparently, the patient was getting his toenails trimmed at his assisted living facility [facility]) when it was noticed there was some abnormality and discoloration of his left second toe.  As result, the patient was sent to the emergency department for further evaluation.  The patient himself denies any pain or discomfort about his second toe.  He is unable to provide any history regarding recent injury or trauma.  He states, " I did not there was something wrong with my toe."  He denies any fevers, chills, chest pain, shortness breath, cough, hemoptysis, nausea, vomiting, diarrhea, domino pain, foot pain.  He continues to smoke tobacco.   ED In the emergency department, the patient was afebrile hemodynamically stable with oxygen saturation 100% room air.  BMP showed a sodium 141, potassium 3.9, serum creatinine 1.04.  WBC 7.1, hemoglobin 14.9, platelets 124,000.  Patient was given a dose of vancomycin.  X-ray of the left foot showed soft tissue swelling with erosion of the distal and middle phalanx of the left second toe.  Admission was requested for further work-up and treatment.    Assessment/Plan: Cellulitis left second toe/Acute osteomyelitis -ESR--5 -CRP--0.7 -Check ABIs--left = 1.10; right = 1.09 -Patient has palpable dorsalis pedis pulses bilateral -Surgery consult noted -Continue vancomycin>>changed to daptomycin -changed to ceftriaxone -MR left  foot--acute osteomyelitis middle and distal phalanges 2nd toe -PICC line placed 11/3 -plan 4 weeks IV abx--last day 02/17/21 -Check BMP, CBC, CK every 7 days, first on 01/28/21   Bipolar disorder -Continue quetiapine, sertraline, trazodone -Continue hydroxyzine  Atrial flutter -Continue apixaban -Continue metoprolol succinate -hold diltiazem due to bradycardia   Seizure disorder -Continue lamotrigine, Keppra, Vimpat   Tobacco abuse -Tobacco cessation discussed   Thrombocytopenia -Likely secondary to chronic hepatitis C -Monitor for signs of bleeding   Chronic hepatitis C -Appears clinically compensated         Status is: Inpatient  Remains inpatient appropriate because: severity of illness requiring IV abx        Family Communication:  no Family at bedside  Consultants:  general surgery  Code Status:  FULL   DVT Prophylaxis:  apixaban   Procedures: As Listed in Progress Note Above  Antibiotics:  Vanco 11/2>>11/4 Cubicin 11/4>> Ceftriaxone 11/3>> Cefepime 11/2      Subjective: Patient denies fevers, chills, headache, chest pain, dyspnea, nausea, vomiting, diarrhea, abdominal pain, dysuria, hematuria, hematochezia, and melena.   Objective: Vitals:   01/24/21 1344 01/24/21 2024 01/25/21 0454 01/25/21 1421  BP: 122/77 135/82 (!) 101/55 (!) 152/109  Pulse: 100 82 (!) 55 76  Resp: 18 20 20 18   Temp: 97.8 F (36.6 C) 98.2 F (36.8 C) 98 F (36.7 C) 98 F (36.7 C)  TempSrc:    Oral  SpO2: 94% 100% 100% 100%  Weight:      Height:        Intake/Output Summary (Last 24 hours) at 01/25/2021 1713 Last data filed at 01/25/2021 1300  Gross per 24 hour  Intake 1030 ml  Output --  Net 1030 ml   Weight change:  Exam:  General:  Pt is alert, follows commands appropriately, not in acute distress HEENT: No icterus, No thrush, No neck mass, [place]/AT Cardiovascular: RRR, S1/S2, no rubs, no gallops Respiratory: CTA bilaterally, no wheezing, no  crackles, no rhonchi Abdomen: Soft/+BS, non tender, non distended, no guarding Extremities: left second toe without necrosis or pus.  No crepitance   Data Reviewed: I have personally reviewed following labs and imaging studies Basic Metabolic Panel: Recent Labs  Lab 01/20/21 1228 01/21/21 0543 01/23/21 0437  NA 141 141 138  K 3.9 4.2 4.3  CL 109 111 109  CO2 26 23 24   GLUCOSE 80 84 92  BUN 19 22 12   CREATININE 1.04 0.95 0.86  CALCIUM 9.0 8.8* 8.6*   Liver Function Tests: No results for input(s): AST, ALT, ALKPHOS, BILITOT, PROT, ALBUMIN in the last 168 hours. No results for input(s): LIPASE, AMYLASE in the last 168 hours. No results for input(s): AMMONIA in the last 168 hours. Coagulation Profile: No results for input(s): INR, PROTIME in the last 168 hours. CBC: Recent Labs  Lab 01/20/21 1228 01/21/21 0543  WBC 7.1 8.3  NEUTROABS 1.7  --   HGB 14.9 14.0  HCT 44.6 42.9  MCV 88.3 89.9  PLT 124* 120*   Cardiac Enzymes: Recent Labs  Lab 01/23/21 0437  CKTOTAL 55   BNP: Invalid input(s): POCBNP CBG: No results for input(s): GLUCAP in the last 168 hours. HbA1C: No results for input(s): HGBA1C in the last 72 hours. Urine analysis:    Component Value Date/Time   COLORURINE YELLOW 12/12/2017 [place] 12/12/2017 0931   LABSPEC 1.023 12/12/2017 0931   PHURINE 6.0 12/12/2017 0931   GLUCOSEU NEGATIVE 12/12/2017 0931   HGBUR NEGATIVE 12/12/2017 0931   BILIRUBINUR NEGATIVE 12/12/2017 0931   BILIRUBINUR [place] 10/19/2015 1526   KETONESUR NEGATIVE 12/12/2017 0931   PROTEINUR NEGATIVE 12/12/2017 0931   UROBILINOGEN >=8.0 10/19/2015 1526   UROBILINOGEN 0.2 10/31/2014 1840   NITRITE NEGATIVE 12/12/2017 0931   LEUKOCYTESUR NEGATIVE 12/12/2017 0931   Sepsis Labs: @LABRCNTIP (procalcitonin:4,lacticidven:4) ) Recent Results (from the past 240 hour(s))  Resp Panel by RT-PCR (Flu A&B, Covid) Nasopharyngeal Swab     Status: None   Collection Time:  01/20/21  1:43 PM   Specimen: Nasopharyngeal Swab; Nasopharyngeal(NP) swabs in vial transport medium  Result Value Ref Range Status   SARS Coronavirus 2 by RT PCR NEGATIVE NEGATIVE Final    Comment: (NOTE) SARS-CoV-2 target nucleic acids are NOT DETECTED.  The SARS-CoV-2 RNA is generally detectable in upper respiratory specimens during the acute phase of infection. The lowest concentration of SARS-CoV-2 viral copies this assay can detect is 138 copies/mL. A negative result does not preclude SARS-Cov-2 infection and should not be used as the sole basis for treatment or other patient management decisions. A negative result may occur with  improper specimen collection/handling, submission of specimen other than nasopharyngeal swab, presence of viral mutation(s) within the areas targeted by this assay, and inadequate number of viral copies(<138 copies/mL). A negative result must be combined with clinical observations, patient history, and epidemiological information. The expected result is Negative.  Fact Sheet for Patients:  EntrepreneurPulse.com.au  Fact Sheet for Healthcare Providers:  IncredibleEmployment.be  This test is no t yet approved or cleared by the [place] FDA and  has been authorized for detection and/or diagnosis of SARS-CoV-2 by FDA under an Emergency  Use Authorization (EUA). This EUA will remain  in effect (meaning this test can be used) for the duration of the COVID-19 declaration under Section 564(b)(1) of the Act, 21 U.S.C.section 360bbb-3(b)(1), unless the authorization is terminated  or revoked sooner.       Influenza A by PCR NEGATIVE NEGATIVE Final   Influenza B by PCR NEGATIVE NEGATIVE Final    Comment: (NOTE) The Xpert Xpress SARS-CoV-2/FLU/RSV plus assay is intended as an aid in the diagnosis of influenza from Nasopharyngeal swab specimens and should not be used as a sole basis for treatment. Nasal washings  and aspirates are unacceptable for Xpert Xpress SARS-CoV-2/FLU/RSV testing.  Fact Sheet for Patients: EntrepreneurPulse.com.au  Fact Sheet for Healthcare Providers: IncredibleEmployment.be  This test is not yet approved or cleared by the [place] FDA and has been authorized for detection and/or diagnosis of SARS-CoV-2 by FDA under an Emergency Use Authorization (EUA). This EUA will remain in effect (meaning this test can be used) for the duration of the COVID-19 declaration under Section 564(b)(1) of the Act, 21 U.S.C. section 360bbb-3(b)(1), unless the authorization is terminated or revoked.  Performed at [facility], [address] [place], [place] [address]      Scheduled Meds:  apixaban  5 mg Oral BID   Chlorhexidine Gluconate Cloth  6 each Topical Daily   hydrOXYzine  25 mg Oral BID   lacosamide  150 mg Oral BID   lamoTRIgine  25 mg Oral Daily   levETIRAcetam  1,500 mg Oral BID   metoprolol succinate  25 mg Oral Daily   nicotine  14 mg Transdermal Daily   QUEtiapine  50 mg Oral BID   And   QUEtiapine  200 mg Oral QHS   sodium chloride flush  10-40 mL Intracatheter Q12H   Continuous Infusions:  cefTRIAXone (ROCEPHIN)  IV 2 g (01/25/21 1437)   DAPTOmycin (CUBICIN)  IV 700 mg (01/24/21 2148)    Procedures/Studies: MR FOOT LEFT W WO CONTRAST  Result Date: 01/21/2021 CLINICAL DATA:  Cellulitis of the left second toe EXAM: MRI OF THE LEFT FOREFOOT WITHOUT AND WITH CONTRAST TECHNIQUE: Multiplanar, multisequence MR imaging of the left forefoot was performed both before and after administration of intravenous contrast. CONTRAST:  78m GADAVIST GADOBUTROL 1 MMOL/ML IV SOLN COMPARISON:  01/20/2021 FINDINGS: Bones/Joint/Cartilage Diffuse edema signal and enhancement in the middle and distal phalanges of the second toe compatible with active osteomyelitis. There is deformity of the distal phalanx of the great toe with scalloping of the tuft  but with little in the way of edema. This may be the result of chronic deformity or remote osteomyelitis but we do not demonstrate striking edema or enhancement in the remaining distal phalanx a thing this less likely to be from acute osteomyelitis. There is some thinning of the overlying soft tissues of the distal toe. Correlate patient history. 5 mm degenerative subcortical cystic lesion or geode in the proximal navicular. Ligaments Lisfranc ligament intact Muscles and Tendons Regional muscular atrophy Soft tissues Subcutaneous edema and enhancement in the second toe compatible with cellulitis. Dorsal subcutaneous edema in the forefoot is not a complete by enhancement and accordingly is not necessarily infectious. IMPRESSION: 1. Active osteomyelitis in the middle and distal phalanges of the second toe with surrounding cellulitis. 2. Deformity of the distal phalanx of the great toe but without abnormal edema or enhancement either in the distal phalanx or in the surrounding soft tissues. Accordingly this is more likely to represent deformity from an old/chronic process rather  than acute osteomyelitis. Electronically Signed   By: Van Clines M.D.   On: 01/21/2021 06:45   US ARTERIAL ABI (SCREENING LOWER EXTREMITY)  Result Date: 01/21/2021 CLINICAL DATA:  Hypertension Current smoker Left second toe cellulitis Left breast pain EXAM: NONINVASIVE PHYSIOLOGIC VASCULAR STUDY OF BILATERAL LOWER EXTREMITIES TECHNIQUE: Evaluation of both lower extremities were performed at rest, including calculation of ankle-brachial indices with single level Doppler, pressure and pulse volume recording. COMPARISON:  None. FINDINGS: Right ABI:  1.10 Left ABI:  1.09 Right Lower Extremity: Monophasic waveform seen in the right posterior tibial and dorsalis pedis arteries. Left Lower Extremity: Monophasic waveform seen in the posterior tibial and dorsalis pedis arteries. 1.0-1.4 Normal IMPRESSION: Although lower extremity ABI values  are within normal limits, monophasic waveforms seen at the ankle bilaterally are suspicious for underlying arterial occlusive disease. Consider further evaluation with CT angiography of the abdominal aorta with runoff. Electronically Signed   By: Miachel Roux M.D.   On: 01/21/2021 11:43   DG Foot Complete Left  Result Date: 01/20/2021 CLINICAL DATA:  Second left toe wound EXAM: LEFT FOOT - COMPLETE 3+ VIEW COMPARISON:  None. FINDINGS: Soft tissue swelling of the distal second digit with erosion of the underlying middle and distal phalanx consistent with osteomyelitis. Erosive changes also noted in the distal tip of the distal phalanx of the great toe with overlying soft tissue abnormality suspicious for chronic osteomyelitis. Atherosclerotic changes seen throughout visualized arterial segments. IMPRESSION: Findings suspicious for osteomyelitis of the tip of the second and first toes. Electronically Signed   By: Miachel Roux M.D.   On: 01/20/2021 11:47   [place] EKG SITE RITE  Result Date: 01/21/2021 If Site Rite image not attached, placement could not be confirmed due to current cardiac rhythm.   Orson Eva, DO  [facility]  If 7PM-7AM, please contact night-coverage [email or web address] Password [email or web address] 01/25/2021, 5:13 PM   LOS: 4 days

## 2021-01-25 NOTE — TOC Progression Note (Signed)
Transition of Care [facility]) - Progression Note    Patient Details  Name: Lothar Prehn MRN: [id number] Date of Birth: [birth year]  Transition of Care [facility]) CM/SW Contact  Barry Brunner, LCSW Phone Number: 01/25/2021, 1:23 PM  Clinical Narrative:    Eunice Blase notified CSW that patient will require an application for [facility] due to patient's medicaid. Debbie reported that it is a lengthy process. CSW faxed patient out to additional [place] SNF's accepting medicaid. TOC to follow.    Expected Discharge Plan: Skilled Nursing Facility Barriers to Discharge: Continued Medical Work up  Expected Discharge Plan and Services Expected Discharge Plan: Skilled Nursing Facility       Living arrangements for the past 2 months: Assisted Living Facility                                       Social Determinants of Health (SDOH) Interventions    Readmission Risk Interventions Readmission Risk Prevention Plan 01/22/2021  Transportation Screening Complete  HRI or Home Care Consult Complete  Social Work Consult for Recovery Care Planning/Counseling Complete  Palliative Care Screening Not Applicable  Medication Review Oceanographer) Complete  Some recent data might be hidden

## 2021-01-26 DIAGNOSIS — M869 Osteomyelitis, unspecified: Secondary | ICD-10-CM | POA: Diagnosis not present

## 2021-01-26 DIAGNOSIS — M86179 Other acute osteomyelitis, unspecified ankle and foot: Secondary | ICD-10-CM | POA: Diagnosis not present

## 2021-01-26 NOTE — TOC Progression Note (Signed)
Transition of Care [facility]) - Progression Note    Patient Details  Name: [name] [name] MRN: [id number] Date of Birth: 10/10/1960  Transition of Care [facility]) CM/SW Contact  Karn Cassis, [place] Phone Number: 01/26/2021, 11:34 AM  Clinical Narrative:  LCSW faxed out to SNFs in [facility] and [facility]. Discussed possibility of pt coming to outpatient infusion clinic for once daily IV antibiotics. However, infusion clinic is only open 3 days a week. TOC will continue to work on placement.      Expected Discharge Plan: Skilled Nursing Facility Barriers to Discharge: Continued Medical Work up  Expected Discharge Plan and Services Expected Discharge Plan: Skilled Nursing Facility       Living arrangements for the past 2 months: Assisted Living Facility Expected Discharge Date: 01/26/21                                     Social Determinants of Health (SDOH) Interventions    Readmission Risk Interventions Readmission Risk Prevention Plan 01/22/2021  Transportation Screening Complete  HRI or Home Care Consult Complete  Social Work Consult for Recovery Care Planning/Counseling Complete  Palliative Care Screening Not Applicable  Medication Review Oceanographer) Complete  Some recent data might be hidden

## 2021-01-26 NOTE — TOC Progression Note (Signed)
Transition of Care [facility]) - Progression Note    Patient Details  Name: Cardarius Senat MRN: [id number] Date of Birth: 10/19/1960  Transition of Care [facility]) CM/SW Contact  Elliot Gault, LCSW Phone Number: 01/26/2021, 2:46 PM  Clinical Narrative:     TOC following. Spoke with Eunice Blase at [facility] to follow up on referral. Per Eunice Blase, she will talk to pt's [facility] owner, Synetta Fail, to try and gather the information she needs to apply for LTC medicaid for pt. Eunice Blase says that if they can get the Medicaid changed over and her corporate approves, she will accept pt. Timeframe in which they will have decision not known. TOC will follow.  Expected Discharge Plan: Skilled Nursing Facility Barriers to Discharge: Continued Medical Work up  Expected Discharge Plan and Services Expected Discharge Plan: Skilled Nursing Facility       Living arrangements for the past 2 months: Assisted Living Facility Expected Discharge Date: 01/26/21                                     Social Determinants of Health (SDOH) Interventions    Readmission Risk Interventions Readmission Risk Prevention Plan 01/22/2021  Transportation Screening Complete  HRI or Home Care Consult Complete  Social Work Consult for Recovery Care Planning/Counseling Complete  Palliative Care Screening Not Applicable  Medication Review Oceanographer) Complete  Some recent data might be hidden

## 2021-01-27 DIAGNOSIS — L03032 Cellulitis of left toe: Secondary | ICD-10-CM | POA: Diagnosis not present

## 2021-01-27 DIAGNOSIS — M869 Osteomyelitis, unspecified: Secondary | ICD-10-CM | POA: Diagnosis not present

## 2021-01-27 DIAGNOSIS — M86179 Other acute osteomyelitis, unspecified ankle and foot: Secondary | ICD-10-CM | POA: Diagnosis not present

## 2021-01-27 MED ORDER — OXYCODONE HCL 5 MG PO TABS
5.0000 mg | ORAL_TABLET | Freq: Once | ORAL | Status: AC
  Administered 2021-01-27: 5 mg via ORAL
  Filled 2021-01-27: qty 1

## 2021-01-27 MED ORDER — DAPTOMYCIN 500 MG IV SOLR
INTRAVENOUS | Status: AC
  Filled 2021-01-27: qty 20

## 2021-01-27 NOTE — Progress Notes (Signed)
PROGRESS NOTE  [name] [name] ZLD:[id number] DOB: 04/17/1960 DOA: 01/20/2021 PCP: [name], NP   LOS: 6 days   Brief Narrative / Interim history: 60 y.o. male with medical history of MDD with psychosis, seizure disorder, hypertension, tobacco abuse, cognitive impairment, atrial flutter with RVR, chronic hepatitis C, history of cocaine and alcohol abuse presenting due to abnormality with his left second toe.  The patient is a poor historian.  When asked why he is in the emergency department, he states " they tell me there is something wrong with my foot".  Apparently, the patient was getting his toenails trimmed at his assisted living facility [facility]) when it was noticed there was some abnormality and discoloration of his left second toe.  As result, the patient was sent to the emergency department for further evaluation.  An MRI showed active osteomyelitis in the middle and distal phalanges of the second toe with surrounding cellulitis.  Subjective / 24h Interval events: No complaints, he is doing well this morning.  Denies any fever or chills.  Assessment & Plan: Principal Problem Cellulitis of the left second toe/acute osteomyelitis-ESR was 5 and CRP was 0.7.  ABIs were checked and it was 1.1 on the left and 1.09 on the right.  Surgery consulted recommending conservative management for now.  Patient was placed on daptomycin and ceftriaxone with plans for 4 weeks of IV antibiotics with the last 02/17/2021.  A PICC line was placed on 11/3. Check BMP, CBC, CK every 7 days, first on 01/28/21  Active Problems Bipolar disorder-continue quetiapine, sertraline, trazodone, hydroxyzine  History of a flutter-continue metoprolol, apixaban.  Diltiazem is on hold due to bradycardia  Seizure disorder-continue lamotrigine, Keppra, Vimpat  Tobacco abuse-cessation has been discussed  Thrombocytopenia-probably due to hep C  Chronic hep C-compensated  Patient is stable for discharge, awaiting  placement as his [name] cannot accept him with IV antibiotics and will need SNF level of care.  Scheduled Meds:  apixaban  5 mg Oral BID   Chlorhexidine Gluconate Cloth  6 each Topical Daily   hydrOXYzine  25 mg Oral BID   lacosamide  150 mg Oral BID   lamoTRIgine  25 mg Oral Daily   levETIRAcetam  1,500 mg Oral BID   metoprolol succinate  25 mg Oral Daily   nicotine  14 mg Transdermal Daily   QUEtiapine  50 mg Oral BID   And   QUEtiapine  200 mg Oral QHS   sodium chloride flush  10-40 mL Intracatheter Q12H   Continuous Infusions:  cefTRIAXone (ROCEPHIN)  IV 200 mL/hr at 01/27/21 0602   DAPTOmycin (CUBICIN)  IV 128 mL/hr at 01/27/21 0602   PRN Meds:.acetaminophen **OR** acetaminophen, haloperidol lactate, sodium chloride flush  Diet Orders (From admission, onward)     Start     Ordered   01/20/21 1612  Diet Heart Room service appropriate? Yes; Fluid consistency: Thin  Diet effective now       Question Answer Comment  Room service appropriate? Yes   Fluid consistency: Thin      01/20/21 1611            DVT prophylaxis:  apixaban (ELIQUIS) tablet 5 mg     Code Status: Full Code  Family Communication: No family at bedside  Status is: Inpatient  Remains inpatient appropriate because: unsafe DC, placement pending  Level of care: Med-Surg  Consultants:  General surgery   Procedures:  none  Microbiology  None   Antimicrobials: Daptomycin  Ceftriaxone   Objective: Vitals:  01/26/21 0515 01/26/21 1350 01/26/21 1958 01/27/21 0308  BP: 114/65 (!) 158/88 (!) 137/92 131/80  Pulse: 75 73 72 65  Resp: 20 17 20 20   Temp: 98.6 F (37 C) 98.1 F (36.7 C) 98.4 F (36.9 C) 98.3 F (36.8 C)  TempSrc:      SpO2: 94% 100% 100% 93%  Weight:      Height:        Intake/Output Summary (Last 24 hours) at 01/27/2021 1230 Last data filed at 01/27/2021 0900 Gross per 24 hour  Intake 1740.29 ml  Output --  Net 1740.29 ml   Filed Weights   01/20/21 1219 01/20/21  1944  Weight: 112.5 kg 106.5 kg    Examination:  Constitutional: NAD Eyes: no scleral icterus ENMT: Mucous membranes are moist.  Neck: normal, supple Respiratory: clear to auscultation bilaterally, no wheezing, no crackles.  Cardiovascular: Regular rate and rhythm, no murmurs / rubs / gallops. No LE edema.  Abdomen: non distended, no tenderness. Bowel sounds positive.  Musculoskeletal: no clubbing / cyanosis.  Skin: no rashes Neurologic: Nonfocal  Data Reviewed: I have independently reviewed following labs and imaging studies   CBC: Recent Labs  Lab 01/21/21 0543  WBC 8.3  HGB 14.0  HCT 42.9  MCV 89.9  PLT 028*   Basic Metabolic Panel: Recent Labs  Lab 01/21/21 0543 01/23/21 0437  NA 141 138  K 4.2 4.3  CL 111 109  CO2 23 24  GLUCOSE 84 92  BUN 22 12  CREATININE 0.95 0.86  CALCIUM 8.8* 8.6*   Liver Function Tests: No results for input(s): AST, ALT, ALKPHOS, BILITOT, PROT, ALBUMIN in the last 168 hours. Coagulation Profile: No results for input(s): INR, PROTIME in the last 168 hours. HbA1C: No results for input(s): HGBA1C in the last 72 hours. CBG: No results for input(s): GLUCAP in the last 168 hours.  Recent Results (from the past 240 hour(s))  Resp Panel by RT-PCR (Flu A&B, Covid) Nasopharyngeal Swab     Status: None   Collection Time: 01/20/21  1:43 PM   Specimen: Nasopharyngeal Swab; Nasopharyngeal(NP) swabs in vial transport medium  Result Value Ref Range Status   SARS Coronavirus 2 by RT PCR NEGATIVE NEGATIVE Final    Comment: (NOTE) SARS-CoV-2 target nucleic acids are NOT DETECTED.  The SARS-CoV-2 RNA is generally detectable in upper respiratory specimens during the acute phase of infection. The lowest concentration of SARS-CoV-2 viral copies this assay can detect is 138 copies/mL. A negative result does not preclude SARS-Cov-2 infection and should not be used as the sole basis for treatment or other patient management decisions. A negative  result may occur with  improper specimen collection/handling, submission of specimen other than nasopharyngeal swab, presence of viral mutation(s) within the areas targeted by this assay, and inadequate number of viral copies(<138 copies/mL). A negative result must be combined with clinical observations, patient history, and epidemiological information. The expected result is Negative.  Fact Sheet for Patients:  EntrepreneurPulse.com.au  Fact Sheet for Healthcare Providers:  IncredibleEmployment.be  This test is no t yet approved or cleared by the [place] FDA and  has been authorized for detection and/or diagnosis of SARS-CoV-2 by FDA under an Emergency Use Authorization (EUA). This EUA will remain  in effect (meaning this test can be used) for the duration of the COVID-19 declaration under Section 564(b)(1) of the Act, 21 U.S.C.section 360bbb-3(b)(1), unless the authorization is terminated  or revoked sooner.       Influenza A by PCR NEGATIVE  NEGATIVE Final   Influenza B by PCR NEGATIVE NEGATIVE Final    Comment: (NOTE) The Xpert Xpress SARS-CoV-2/FLU/RSV plus assay is intended as an aid in the diagnosis of influenza from Nasopharyngeal swab specimens and should not be used as a sole basis for treatment. Nasal washings and aspirates are unacceptable for Xpert Xpress SARS-CoV-2/FLU/RSV testing.  Fact Sheet for Patients: EntrepreneurPulse.com.au  Fact Sheet for Healthcare Providers: IncredibleEmployment.be  This test is not yet approved or cleared by the [place] FDA and has been authorized for detection and/or diagnosis of SARS-CoV-2 by FDA under an Emergency Use Authorization (EUA). This EUA will remain in effect (meaning this test can be used) for the duration of the COVID-19 declaration under Section 564(b)(1) of the Act, 21 U.S.C. section 360bbb-3(b)(1), unless the authorization is  terminated or revoked.  Performed at [facility], [address] [place], [place] [address]      Radiology Studies: No results found.   Marzetta Board, MD, PhD [facility]  Between 7 am - 7 pm I am available, please contact me via Amion (for emergencies) or Securechat (non urgent messages)  Between 7 pm - 7 am I am not available, please contact night coverage MD/APP via Amion

## 2021-01-27 NOTE — Plan of Care (Signed)

## 2021-01-28 DIAGNOSIS — M86179 Other acute osteomyelitis, unspecified ankle and foot: Secondary | ICD-10-CM | POA: Diagnosis not present

## 2021-01-28 LAB — COMPREHENSIVE METABOLIC PANEL
ALT: 34 U/L (ref 0–44)
AST: 32 U/L (ref 15–41)
Albumin: 3.4 g/dL — ABNORMAL LOW (ref 3.5–5.0)
Alkaline Phosphatase: 70 U/L (ref 38–126)
Anion gap: 8 (ref 5–15)
BUN: 20 mg/dL (ref 8–23)
CO2: 29 mmol/L (ref 22–32)
Calcium: 8.8 mg/dL — ABNORMAL LOW (ref 8.9–10.3)
Chloride: 103 mmol/L (ref 98–111)
Creatinine, Ser: 0.94 mg/dL (ref 0.61–1.24)
GFR, Estimated: >60 mL/min (ref >60)
Glucose, Bld: 106 mg/dL — ABNORMAL HIGH (ref 70–99)
Potassium: 4.7 mmol/L (ref 3.5–5.1)
Sodium: 140 mmol/L (ref 135–145)
Total Bilirubin: 0.5 mg/dL (ref 0.3–1.2)
Total Protein: 6.7 g/dL (ref 6.5–8.1)

## 2021-01-28 LAB — CBC
HCT: 46.8 % (ref 39.0–52.0)
Hemoglobin: 15.3 g/dL (ref 13.0–17.0)
MCH: 29 pg (ref 26.0–34.0)
MCHC: 32.7 g/dL (ref 30.0–36.0)
MCV: 88.8 fL (ref 80.0–100.0)
Platelets: 133 10*3/uL — ABNORMAL LOW (ref 150–400)
RBC: 5.27 MIL/uL (ref 4.22–5.81)
RDW: 16.9 % — ABNORMAL HIGH (ref 11.5–15.5)
WBC: 7 10*3/uL (ref 4.0–10.5)
nRBC: 0 % (ref 0.0–0.2)

## 2021-01-28 LAB — CK: Total CK: 59 U/L (ref 49–397)

## 2021-01-28 NOTE — Plan of Care (Signed)

## 2021-01-28 NOTE — Progress Notes (Signed)
Patient alert, but not fully oriented. Very forgetful, though pleasant. Asks questions repeatedly, but took meds without any trouble. Vitals stable. Has been encouraged to let the staff get him clean, but has been declining. The CNA caring for him has asked an older tech to assist later as patient may be more agreeable to her.

## 2021-01-28 NOTE — Progress Notes (Signed)
PROGRESS NOTE  [name] [name] MAU:[id number] DOB: 1961-03-03 DOA: 01/20/2021 PCP: [name], NP   LOS: 7 days   Brief Narrative / Interim history: 60 y.o. male with medical history of MDD with psychosis, seizure disorder, hypertension, tobacco abuse, cognitive impairment, atrial flutter with RVR, chronic hepatitis C, history of cocaine and alcohol abuse presenting due to abnormality with his left second toe.  The patient is a poor historian.  When asked why he is in the emergency department, he states " they tell me there is something wrong with my foot".  Apparently, the patient was getting his toenails trimmed at his assisted living facility [facility]) when it was noticed there was some abnormality and discoloration of his left second toe.  As result, the patient was sent to the emergency department for further evaluation.  An MRI showed active osteomyelitis in the middle and distal phalanges of the second toe with surrounding cellulitis.  Subjective / 24h Interval events: No complaints, doing well  Assessment & Plan: Principal Problem Cellulitis of the left second toe/acute osteomyelitis-ESR was 5 and CRP was 0.7.  ABIs were checked and it was 1.1 on the left and 1.09 on the right.  Surgery consulted recommending conservative management for now.  Patient was placed on daptomycin and ceftriaxone with plans for 4 weeks of IV antibiotics with the last 02/17/2021.  A PICC line was placed on 11/3. Check BMP, CBC, CK every 7 days, first on 01/28/21  Active Problems Bipolar disorder-continue quetiapine, sertraline, trazodone, hydroxyzine  History of a flutter-continue metoprolol, apixaban.  Diltiazem is on hold due to bradycardia  Seizure disorder-continue lamotrigine, Keppra, Vimpat  Tobacco abuse-cessation has been discussed  Thrombocytopenia-probably due to hep C  Chronic hep C-compensated  Patient is stable for discharge, awaiting placement as his [name] cannot accept him with IV  antibiotics and will need SNF level of care.  Scheduled Meds:  apixaban  5 mg Oral BID   Chlorhexidine Gluconate Cloth  6 each Topical Daily   hydrOXYzine  25 mg Oral BID   lacosamide  150 mg Oral BID   lamoTRIgine  25 mg Oral Daily   levETIRAcetam  1,500 mg Oral BID   metoprolol succinate  25 mg Oral Daily   nicotine  14 mg Transdermal Daily   QUEtiapine  50 mg Oral BID   And   QUEtiapine  200 mg Oral QHS   sodium chloride flush  10-40 mL Intracatheter Q12H   Continuous Infusions:  cefTRIAXone (ROCEPHIN)  IV 2 g (01/27/21 1442)   DAPTOmycin (CUBICIN)  IV Stopped (01/27/21 2235)   PRN Meds:.acetaminophen **OR** acetaminophen, haloperidol lactate, sodium chloride flush  Diet Orders (From admission, onward)     Start     Ordered   01/20/21 1612  Diet Heart Room service appropriate? Yes; Fluid consistency: Thin  Diet effective now       Question Answer Comment  Room service appropriate? Yes   Fluid consistency: Thin      01/20/21 1611            DVT prophylaxis:  apixaban (ELIQUIS) tablet 5 mg     Code Status: Full Code  Family Communication: No family at bedside  Status is: Inpatient  Remains inpatient appropriate because: unsafe DC, placement pending  Level of care: Med-Surg  Consultants:  General surgery   Procedures:  none  Microbiology  None   Antimicrobials: Daptomycin  Ceftriaxone   Objective: Vitals:   01/27/21 0308 01/27/21 1530 01/27/21 2151 01/28/21 0456  BP: 131/80  134/90 106/86 120/82  Pulse: 65 71 (!) 57 (!) 56  Resp: 20 16 18 18   Temp: 98.3 F (36.8 C)  97.6 F (36.4 C) 97.7 F (36.5 C)  TempSrc:   Oral Oral  SpO2: 93% 100% 97% 100%  Weight:      Height:        Intake/Output Summary (Last 24 hours) at 01/28/2021 1111 Last data filed at 01/28/2021 2831 Gross per 24 hour  Intake 544 ml  Output 350 ml  Net 194 ml    Filed Weights   01/20/21 1219 01/20/21 1944  Weight: 112.5 kg 106.5 kg     Examination:  Constitutional: No distress Respiratory: CTA Cardiovascular: Regular rate and rhythm, no edema  Data Reviewed: I have independently reviewed following labs and imaging studies   CBC: Recent Labs  Lab 01/28/21 0507  WBC 7.0  HGB 15.3  HCT 46.8  MCV 88.8  PLT 133*    Basic Metabolic Panel: Recent Labs  Lab 01/23/21 0437 01/28/21 0507  NA 138 140  K 4.3 4.7  CL 109 103  CO2 24 29  GLUCOSE 92 106*  BUN 12 20  CREATININE 0.86 0.94  CALCIUM 8.6* 8.8*    Liver Function Tests: Recent Labs  Lab 01/28/21 0507  AST 32  ALT 34  ALKPHOS 70  BILITOT 0.5  PROT 6.7  ALBUMIN 3.4*   Coagulation Profile: No results for input(s): INR, PROTIME in the last 168 hours. HbA1C: No results for input(s): HGBA1C in the last 72 hours. CBG: No results for input(s): GLUCAP in the last 168 hours.  Recent Results (from the past 240 hour(s))  Resp Panel by RT-PCR (Flu A&B, Covid) Nasopharyngeal Swab     Status: None   Collection Time: 01/20/21  1:43 PM   Specimen: Nasopharyngeal Swab; Nasopharyngeal(NP) swabs in vial transport medium  Result Value Ref Range Status   SARS Coronavirus 2 by RT PCR NEGATIVE NEGATIVE Final    Comment: (NOTE) SARS-CoV-2 target nucleic acids are NOT DETECTED.  The SARS-CoV-2 RNA is generally detectable in upper respiratory specimens during the acute phase of infection. The lowest concentration of SARS-CoV-2 viral copies this assay can detect is 138 copies/mL. A negative result does not preclude SARS-Cov-2 infection and should not be used as the sole basis for treatment or other patient management decisions. A negative result may occur with  improper specimen collection/handling, submission of specimen other than nasopharyngeal swab, presence of viral mutation(s) within the areas targeted by this assay, and inadequate number of viral copies(<138 copies/mL). A negative result must be combined with clinical observations, patient history,  and epidemiological information. The expected result is Negative.  Fact Sheet for Patients:  EntrepreneurPulse.com.au  Fact Sheet for Healthcare Providers:  IncredibleEmployment.be  This test is no t yet approved or cleared by the [place] FDA and  has been authorized for detection and/or diagnosis of SARS-CoV-2 by FDA under an Emergency Use Authorization (EUA). This EUA will remain  in effect (meaning this test can be used) for the duration of the COVID-19 declaration under Section 564(b)(1) of the Act, 21 U.S.C.section 360bbb-3(b)(1), unless the authorization is terminated  or revoked sooner.       Influenza A by PCR NEGATIVE NEGATIVE Final   Influenza B by PCR NEGATIVE NEGATIVE Final    Comment: (NOTE) The Xpert Xpress SARS-CoV-2/FLU/RSV plus assay is intended as an aid in the diagnosis of influenza from Nasopharyngeal swab specimens and should not be used as a sole basis for treatment.  Nasal washings and aspirates are unacceptable for Xpert Xpress SARS-CoV-2/FLU/RSV testing.  Fact Sheet for Patients: EntrepreneurPulse.com.au  Fact Sheet for Healthcare Providers: IncredibleEmployment.be  This test is not yet approved or cleared by the [place] FDA and has been authorized for detection and/or diagnosis of SARS-CoV-2 by FDA under an Emergency Use Authorization (EUA). This EUA will remain in effect (meaning this test can be used) for the duration of the COVID-19 declaration under Section 564(b)(1) of the Act, 21 U.S.C. section 360bbb-3(b)(1), unless the authorization is terminated or revoked.  Performed at [facility], [address] [place], [place] [address]       Radiology Studies: No results found.   Marzetta Board, MD, PhD [facility]  Between 7 am - 7 pm I am available, please contact me via Amion (for emergencies) or Securechat (non urgent messages)  Between 7 pm - 7  am I am not available, please contact night coverage MD/APP via Amion

## 2021-01-29 DIAGNOSIS — M86179 Other acute osteomyelitis, unspecified ankle and foot: Secondary | ICD-10-CM | POA: Diagnosis not present

## 2021-01-29 NOTE — Plan of Care (Signed)

## 2021-01-29 NOTE — Progress Notes (Signed)
PROGRESS NOTE  [name] [name] PQA:[id number] DOB: 1960/12/01 DOA: 01/20/2021 PCP: [name], NP   LOS: 8 days   Brief Narrative / Interim history: 60 y.o. male with medical history of MDD with psychosis, seizure disorder, hypertension, tobacco abuse, cognitive impairment, atrial flutter with RVR, chronic hepatitis C, history of cocaine and alcohol abuse presenting due to abnormality with his left second toe.  The patient is a poor historian.  When asked why he is in the emergency department, he states " they tell me there is something wrong with my foot".  Apparently, the patient was getting his toenails trimmed at his assisted living facility [facility]) when it was noticed there was some abnormality and discoloration of his left second toe.  As result, the patient was sent to the emergency department for further evaluation.  An MRI showed active osteomyelitis in the middle and distal phalanges of the second toe with surrounding cellulitis.  Subjective / 24h Interval events: No complaints, awaiting breakfast  Assessment & Plan: Principal Problem Cellulitis of the left second toe/acute osteomyelitis-ESR was 5 and CRP was 0.7.  ABIs were checked and it was 1.1 on the left and 1.09 on the right.  Surgery consulted recommending conservative management for now.  Patient was placed on daptomycin and ceftriaxone with plans for 4 weeks of IV antibiotics with the last 02/17/2021.  A PICC line was placed on 11/3. Check BMP, CBC, CK every 7 days, within acceptable parameters on 11/10, recheck again 11/17  Active Problems Bipolar disorder-continue quetiapine, sertraline, trazodone, hydroxyzine  History of a flutter-continue metoprolol, apixaban.  Diltiazem is on hold due to bradycardia  Seizure disorder-continue lamotrigine, Keppra, Vimpat  Tobacco abuse-cessation has been discussed  Thrombocytopenia-probably due to hep C  Chronic hep C-compensated  Patient is stable for discharge, awaiting  placement as his [name] cannot accept him with IV antibiotics and will need SNF level of care.  Scheduled Meds:  apixaban  5 mg Oral BID   Chlorhexidine Gluconate Cloth  6 each Topical Daily   hydrOXYzine  25 mg Oral BID   lacosamide  150 mg Oral BID   lamoTRIgine  25 mg Oral Daily   levETIRAcetam  1,500 mg Oral BID   metoprolol succinate  25 mg Oral Daily   nicotine  14 mg Transdermal Daily   QUEtiapine  50 mg Oral BID   And   QUEtiapine  200 mg Oral QHS   sodium chloride flush  10-40 mL Intracatheter Q12H   Continuous Infusions:  cefTRIAXone (ROCEPHIN)  IV 2 g (01/28/21 1440)   DAPTOmycin (CUBICIN)  IV 700 mg (01/28/21 2119)   PRN Meds:.acetaminophen **OR** acetaminophen, haloperidol lactate, sodium chloride flush  Diet Orders (From admission, onward)     Start     Ordered   01/20/21 1612  Diet Heart Room service appropriate? Yes; Fluid consistency: Thin  Diet effective now       Question Answer Comment  Room service appropriate? Yes   Fluid consistency: Thin      01/20/21 1611            DVT prophylaxis:  apixaban (ELIQUIS) tablet 5 mg     Code Status: Full Code  Family Communication: No family at bedside  Status is: Inpatient  Remains inpatient appropriate because: unsafe DC, placement pending  Level of care: Med-Surg  Consultants:  General surgery   Procedures:  none  Microbiology  None   Antimicrobials: Daptomycin  Ceftriaxone   Objective: Vitals:   01/28/21 0456 01/28/21 1400 01/28/21  2028 01/29/21 0509  BP: 120/82 130/66 116/77 117/72  Pulse: (!) 56 60 67 (!) 56  Resp: 18 20 20 20   Temp: 97.7 F (36.5 C) 97.9 F (36.6 C) 98.1 F (36.7 C) (!) 97.5 F (36.4 C)  TempSrc: Oral Oral Oral Oral  SpO2: 100% 100% 100% 99%  Weight:      Height:        Intake/Output Summary (Last 24 hours) at 01/29/2021 1214 Last data filed at 01/29/2021 0900 Gross per 24 hour  Intake 1610 ml  Output --  Net 1610 ml    Filed Weights   01/20/21 1219  01/20/21 1944  Weight: 112.5 kg 106.5 kg    Examination:  Constitutional: He is in no distress  Data Reviewed: I have independently reviewed following labs and imaging studies   CBC: Recent Labs  Lab 01/28/21 0507  WBC 7.0  HGB 15.3  HCT 46.8  MCV 88.8  PLT 133*    Basic Metabolic Panel: Recent Labs  Lab 01/23/21 0437 01/28/21 0507  NA 138 140  K 4.3 4.7  CL 109 103  CO2 24 29  GLUCOSE 92 106*  BUN 12 20  CREATININE 0.86 0.94  CALCIUM 8.6* 8.8*    Liver Function Tests: Recent Labs  Lab 01/28/21 0507  AST 32  ALT 34  ALKPHOS 70  BILITOT 0.5  PROT 6.7  ALBUMIN 3.4*    Coagulation Profile: No results for input(s): INR, PROTIME in the last 168 hours. HbA1C: No results for input(s): HGBA1C in the last 72 hours. CBG: No results for input(s): GLUCAP in the last 168 hours.  Recent Results (from the past 240 hour(s))  Resp Panel by RT-PCR (Flu A&B, Covid) Nasopharyngeal Swab     Status: None   Collection Time: 01/20/21  1:43 PM   Specimen: Nasopharyngeal Swab; Nasopharyngeal(NP) swabs in vial transport medium  Result Value Ref Range Status   SARS Coronavirus 2 by RT PCR NEGATIVE NEGATIVE Final    Comment: (NOTE) SARS-CoV-2 target nucleic acids are NOT DETECTED.  The SARS-CoV-2 RNA is generally detectable in upper respiratory specimens during the acute phase of infection. The lowest concentration of SARS-CoV-2 viral copies this assay can detect is 138 copies/mL. A negative result does not preclude SARS-Cov-2 infection and should not be used as the sole basis for treatment or other patient management decisions. A negative result may occur with  improper specimen collection/handling, submission of specimen other than nasopharyngeal swab, presence of viral mutation(s) within the areas targeted by this assay, and inadequate number of viral copies(<138 copies/mL). A negative result must be combined with clinical observations, patient history, and  epidemiological information. The expected result is Negative.  Fact Sheet for Patients:  EntrepreneurPulse.com.au  Fact Sheet for Healthcare Providers:  IncredibleEmployment.be  This test is no t yet approved or cleared by the [place] FDA and  has been authorized for detection and/or diagnosis of SARS-CoV-2 by FDA under an Emergency Use Authorization (EUA). This EUA will remain  in effect (meaning this test can be used) for the duration of the COVID-19 declaration under Section 564(b)(1) of the Act, 21 U.S.C.section 360bbb-3(b)(1), unless the authorization is terminated  or revoked sooner.       Influenza A by PCR NEGATIVE NEGATIVE Final   Influenza B by PCR NEGATIVE NEGATIVE Final    Comment: (NOTE) The Xpert Xpress SARS-CoV-2/FLU/RSV plus assay is intended as an aid in the diagnosis of influenza from Nasopharyngeal swab specimens and should not be used as a  sole basis for treatment. Nasal washings and aspirates are unacceptable for Xpert Xpress SARS-CoV-2/FLU/RSV testing.  Fact Sheet for Patients: EntrepreneurPulse.com.au  Fact Sheet for Healthcare Providers: IncredibleEmployment.be  This test is not yet approved or cleared by the [place] FDA and has been authorized for detection and/or diagnosis of SARS-CoV-2 by FDA under an Emergency Use Authorization (EUA). This EUA will remain in effect (meaning this test can be used) for the duration of the COVID-19 declaration under Section 564(b)(1) of the Act, 21 U.S.C. section 360bbb-3(b)(1), unless the authorization is terminated or revoked.  Performed at [facility], [address], [place], [place] [address]       Radiology Studies: No results found.   Marzetta Board, MD, PhD [facility]  Between 7 am - 7 pm I am available, please contact me via Amion (for emergencies) or Securechat (non urgent messages)  Between 7 pm - 7 am I  am not available, please contact night coverage MD/APP via Amion

## 2021-01-30 DIAGNOSIS — M86179 Other acute osteomyelitis, unspecified ankle and foot: Secondary | ICD-10-CM | POA: Diagnosis not present

## 2021-01-30 NOTE — Progress Notes (Signed)
PROGRESS NOTE  [name] [name] [id number] DOB: Nov 14, 1960 DOA: 01/20/2021 PCP: [name], NP   LOS: 9 days   Brief Narrative / Interim history: 60 y.o. male with medical history of MDD with psychosis, seizure disorder, hypertension, tobacco abuse, cognitive impairment, atrial flutter with RVR, chronic hepatitis C, history of cocaine and alcohol abuse presenting due to abnormality with his left second toe.  The patient is a poor historian.  When asked why he is in the emergency department, he states " they tell me there is something wrong with my foot".  Apparently, the patient was getting his toenails trimmed at his assisted living facility [facility]) when it was noticed there was some abnormality and discoloration of his left second toe.  As result, the patient was sent to the emergency department for further evaluation.  An MRI showed active osteomyelitis in the middle and distal phalanges of the second toe with surrounding cellulitis.  Subjective / 24h Interval events: No complaints  Assessment & Plan: Principal Problem Cellulitis of the left second toe/acute osteomyelitis-ESR was 5 and CRP was 0.7.  ABIs were checked and it was 1.1 on the left and 1.09 on the right.  Surgery consulted recommending conservative management for now.  Patient was placed on daptomycin and ceftriaxone with plans for 4 weeks of IV antibiotics with the last 02/17/2021.  A PICC line was placed on 11/3. Check BMP, CBC, CK every 7 days, within acceptable parameters on 11/10, recheck again 11/17  Active Problems Bipolar disorder-continue quetiapine, sertraline, trazodone, hydroxyzine  History of a flutter-continue metoprolol, apixaban.  Diltiazem is on hold due to bradycardia  Seizure disorder-continue lamotrigine, Keppra, Vimpat  Tobacco abuse-cessation has been discussed  Thrombocytopenia-probably due to hep C  Chronic hep C-compensated  Patient is stable for discharge, awaiting placement as his [name]  cannot accept him with IV antibiotics and will need SNF level of care.  Scheduled Meds:  apixaban  5 mg Oral BID   Chlorhexidine Gluconate Cloth  6 each Topical Daily   hydrOXYzine  25 mg Oral BID   lacosamide  150 mg Oral BID   lamoTRIgine  25 mg Oral Daily   levETIRAcetam  1,500 mg Oral BID   metoprolol succinate  25 mg Oral Daily   nicotine  14 mg Transdermal Daily   QUEtiapine  50 mg Oral BID   And   QUEtiapine  200 mg Oral QHS   sodium chloride flush  10-40 mL Intracatheter Q12H   Continuous Infusions:  cefTRIAXone (ROCEPHIN)  IV 2 g (01/29/21 1252)   DAPTOmycin (CUBICIN)  IV 700 mg (01/29/21 2111)   PRN Meds:.acetaminophen **OR** acetaminophen, haloperidol lactate, sodium chloride flush  Diet Orders (From admission, onward)     Start     Ordered   01/20/21 1612  Diet Heart Room service appropriate? Yes; Fluid consistency: Thin  Diet effective now       Question Answer Comment  Room service appropriate? Yes   Fluid consistency: Thin      01/20/21 1611            DVT prophylaxis:  apixaban (ELIQUIS) tablet 5 mg     Code Status: Full Code  Family Communication: No family at bedside  Status is: Inpatient  Remains inpatient appropriate because: unsafe DC, placement pending  Level of care: Med-Surg  Consultants:  General surgery   Procedures:  none  Microbiology  None   Antimicrobials: Daptomycin  Ceftriaxone   Objective: Vitals:   01/29/21 1417 01/29/21 2023 01/30/21 0603 01/30/21  0800  BP: 115/72 110/84 113/61 111/73  Pulse: 62 (!) 57 (!) 54 (!) 54  Resp: 19 18 18 18   Temp: 97.8 F (36.6 C) 98.1 F (36.7 C) 97.9 F (36.6 C)   TempSrc: Oral     SpO2: 98% 98% 98% 100%  Weight:      Height:        Intake/Output Summary (Last 24 hours) at 01/30/2021 1041 Last data filed at 01/30/2021 0900 Gross per 24 hour  Intake 1781.08 ml  Output 400 ml  Net 1381.08 ml    Filed Weights   01/20/21 1219 01/20/21 1944  Weight: 112.5 kg 106.5 kg     Examination:  Constitutional: NAD  Data Reviewed: I have independently reviewed following labs and imaging studies   CBC: Recent Labs  Lab 01/28/21 0507  WBC 7.0  HGB 15.3  HCT 46.8  MCV 88.8  PLT 133*    Basic Metabolic Panel: Recent Labs  Lab 01/28/21 0507  NA 140  K 4.7  CL 103  CO2 29  GLUCOSE 106*  BUN 20  CREATININE 0.94  CALCIUM 8.8*    Liver Function Tests: Recent Labs  Lab 01/28/21 0507  AST 32  ALT 34  ALKPHOS 70  BILITOT 0.5  PROT 6.7  ALBUMIN 3.4*    Coagulation Profile: No results for input(s): INR, PROTIME in the last 168 hours. HbA1C: No results for input(s): HGBA1C in the last 72 hours. CBG: No results for input(s): GLUCAP in the last 168 hours.  Recent Results (from the past 240 hour(s))  Resp Panel by RT-PCR (Flu A&B, Covid) Nasopharyngeal Swab     Status: None   Collection Time: 01/20/21  1:43 PM   Specimen: Nasopharyngeal Swab; Nasopharyngeal(NP) swabs in vial transport medium  Result Value Ref Range Status   SARS Coronavirus 2 by RT PCR NEGATIVE NEGATIVE Final    Comment: (NOTE) SARS-CoV-2 target nucleic acids are NOT DETECTED.  The SARS-CoV-2 RNA is generally detectable in upper respiratory specimens during the acute phase of infection. The lowest concentration of SARS-CoV-2 viral copies this assay can detect is 138 copies/mL. A negative result does not preclude SARS-Cov-2 infection and should not be used as the sole basis for treatment or other patient management decisions. A negative result may occur with  improper specimen collection/handling, submission of specimen other than nasopharyngeal swab, presence of viral mutation(s) within the areas targeted by this assay, and inadequate number of viral copies(<138 copies/mL). A negative result must be combined with clinical observations, patient history, and epidemiological information. The expected result is Negative.  Fact Sheet for Patients:   EntrepreneurPulse.com.au  Fact Sheet for Healthcare Providers:  IncredibleEmployment.be  This test is no t yet approved or cleared by the [place] FDA and  has been authorized for detection and/or diagnosis of SARS-CoV-2 by FDA under an Emergency Use Authorization (EUA). This EUA will remain  in effect (meaning this test can be used) for the duration of the COVID-19 declaration under Section 564(b)(1) of the Act, 21 U.S.C.section 360bbb-3(b)(1), unless the authorization is terminated  or revoked sooner.       Influenza A by PCR NEGATIVE NEGATIVE Final   Influenza B by PCR NEGATIVE NEGATIVE Final    Comment: (NOTE) The Xpert Xpress SARS-CoV-2/FLU/RSV plus assay is intended as an aid in the diagnosis of influenza from Nasopharyngeal swab specimens and should not be used as a sole basis for treatment. Nasal washings and aspirates are unacceptable for Xpert Xpress SARS-CoV-2/FLU/RSV testing.  Fact Sheet  for Patients: EntrepreneurPulse.com.au  Fact Sheet for Healthcare Providers: IncredibleEmployment.be  This test is not yet approved or cleared by the [place] FDA and has been authorized for detection and/or diagnosis of SARS-CoV-2 by FDA under an Emergency Use Authorization (EUA). This EUA will remain in effect (meaning this test can be used) for the duration of the COVID-19 declaration under Section 564(b)(1) of the Act, 21 U.S.C. section 360bbb-3(b)(1), unless the authorization is terminated or revoked.  Performed at [facility], [address], [place], [place] [address]       Radiology Studies: No results found.   Marzetta Board, MD, PhD [facility]  Between 7 am - 7 pm I am available, please contact me via Amion (for emergencies) or Securechat (non urgent messages)  Between 7 pm - 7 am I am not available, please contact night coverage MD/APP via Amion

## 2021-01-30 NOTE — Progress Notes (Signed)
Pt is alert to self and disoriented to place, time and situation. Pt is easy to redirect.

## 2021-01-31 DIAGNOSIS — M86179 Other acute osteomyelitis, unspecified ankle and foot: Secondary | ICD-10-CM | POA: Diagnosis not present

## 2021-01-31 NOTE — Progress Notes (Signed)
PROGRESS NOTE  [name] [name] [id number] DOB: [birth year] DOA: 01/20/2021 PCP: [name], NP   LOS: 10 days   Brief Narrative / Interim history: 60 y.o. male with medical history of MDD with psychosis, seizure disorder, hypertension, tobacco abuse, cognitive impairment, atrial flutter with RVR, chronic hepatitis C, history of cocaine and alcohol abuse presenting due to abnormality with his left second toe.  The patient is a poor historian.  When asked why he is in the emergency department, he states " they tell me there is something wrong with my foot".  Apparently, the patient was getting his toenails trimmed at his assisted living facility [facility]) when it was noticed there was some abnormality and discoloration of his left second toe.  As result, the patient was sent to the emergency department for further evaluation.  An MRI showed active osteomyelitis in the middle and distal phalanges of the second toe with surrounding cellulitis.  Subjective / 24h Interval events: He tells me he does not know why he is in the hospital, does not know how he got here and wondering why is he not at his assisted living facility.  No complaints otherwise  Assessment & Plan: Principal Problem Cellulitis of the left second toe/acute osteomyelitis-ESR was 5 and CRP was 0.7.  ABIs were checked and it was 1.1 on the left and 1.09 on the right.  Surgery consulted recommending conservative management for now.  Patient was placed on daptomycin and ceftriaxone with plans for 4 weeks of IV antibiotics with the last 02/17/2021.  A PICC line was placed on 11/3. Check BMP, CBC, CK every 7 days, within acceptable parameters on 11/10, recheck again 11/17  Active Problems Bipolar disorder-continue quetiapine, sertraline, trazodone, hydroxyzine  History of a flutter-continue metoprolol, apixaban.  Diltiazem is on hold due to bradycardia  Seizure disorder-continue lamotrigine, Keppra, Vimpat  Tobacco abuse-cessation  has been discussed  Thrombocytopenia-probably due to hep C  Chronic hep C-compensated  History of mild cognitive impairment-confused at times but easily redirectable  Patient is stable for discharge, awaiting placement as his [name] cannot accept him with IV antibiotics and will need SNF level of care.  Scheduled Meds:  apixaban  5 mg Oral BID   Chlorhexidine Gluconate Cloth  6 each Topical Daily   hydrOXYzine  25 mg Oral BID   lacosamide  150 mg Oral BID   lamoTRIgine  25 mg Oral Daily   levETIRAcetam  1,500 mg Oral BID   metoprolol succinate  25 mg Oral Daily   nicotine  14 mg Transdermal Daily   QUEtiapine  50 mg Oral BID   And   QUEtiapine  200 mg Oral QHS   sodium chloride flush  10-40 mL Intracatheter Q12H   Continuous Infusions:  cefTRIAXone (ROCEPHIN)  IV Stopped (01/30/21 1415)   DAPTOmycin (CUBICIN)  IV Stopped (01/30/21 2015)   PRN Meds:.acetaminophen **OR** acetaminophen, haloperidol lactate, sodium chloride flush  Diet Orders (From admission, onward)     Start     Ordered   01/20/21 1612  Diet Heart Room service appropriate? Yes; Fluid consistency: Thin  Diet effective now       Question Answer Comment  Room service appropriate? Yes   Fluid consistency: Thin      01/20/21 1611            DVT prophylaxis:  apixaban (ELIQUIS) tablet 5 mg     Code Status: Full Code  Family Communication: No family at bedside  Status is: Inpatient  Remains inpatient appropriate because:  unsafe DC, placement pending  Level of care: Med-Surg  Consultants:  General surgery   Procedures:  none  Microbiology  None   Antimicrobials: Daptomycin  Ceftriaxone   Objective: Vitals:   01/30/21 1419 01/30/21 2125 01/31/21 0535 01/31/21 0808  BP: 127/75 121/76 109/77 116/76  Pulse: 64 63 (!) 59 72  Resp: 18 19 16    Temp: 98.5 F (36.9 C) 97.6 F (36.4 C) 98.4 F (36.9 C)   TempSrc:  Oral    SpO2: 97% 97% 95%   Weight:      Height:        Intake/Output  Summary (Last 24 hours) at 01/31/2021 1009 Last data filed at 01/31/2021 0900 Gross per 24 hour  Intake 715.46 ml  Output 500 ml  Net 215.46 ml    Filed Weights   01/20/21 1219 01/20/21 1944  Weight: 112.5 kg 106.5 kg    Examination:  Constitutional: No distress, at the edge of the bed  Data Reviewed: I have independently reviewed following labs and imaging studies   CBC: Recent Labs  Lab 01/28/21 0507  WBC 7.0  HGB 15.3  HCT 46.8  MCV 88.8  PLT 133*    Basic Metabolic Panel: Recent Labs  Lab 01/28/21 0507  NA 140  K 4.7  CL 103  CO2 29  GLUCOSE 106*  BUN 20  CREATININE 0.94  CALCIUM 8.8*    Liver Function Tests: Recent Labs  Lab 01/28/21 0507  AST 32  ALT 34  ALKPHOS 70  BILITOT 0.5  PROT 6.7  ALBUMIN 3.4*    Coagulation Profile: No results for input(s): INR, PROTIME in the last 168 hours. HbA1C: No results for input(s): HGBA1C in the last 72 hours. CBG: No results for input(s): GLUCAP in the last 168 hours.  No results found for this or any previous visit (from the past 240 hour(s)).     Radiology Studies: No results found.   Marzetta Board, MD, PhD [facility]  Between 7 am - 7 pm I am available, please contact me via Amion (for emergencies) or Securechat (non urgent messages)  Between 7 pm - 7 am I am not available, please contact night coverage MD/APP via Amion

## 2021-02-01 DIAGNOSIS — M86179 Other acute osteomyelitis, unspecified ankle and foot: Secondary | ICD-10-CM | POA: Diagnosis not present

## 2021-02-01 NOTE — Progress Notes (Signed)
PROGRESS NOTE  [name] [name] [id number] DOB: January 16, 1961 DOA: 01/20/2021 PCP: [name], NP   LOS: 11 days   Brief Narrative / Interim history: 60 y.o. male with medical history of MDD with psychosis, seizure disorder, hypertension, tobacco abuse, cognitive impairment, atrial flutter with RVR, chronic hepatitis C, history of cocaine and alcohol abuse presenting due to abnormality with his left second toe.  The patient is a poor historian.  When asked why he is in the emergency department, he states " they tell me there is something wrong with my foot".  Apparently, the patient was getting his toenails trimmed at his assisted living facility [facility]) when it was noticed there was some abnormality and discoloration of his left second toe.  As result, the patient was sent to the emergency department for further evaluation.  An MRI showed active osteomyelitis in the middle and distal phalanges of the second toe with surrounding cellulitis.  Subjective / 24h Interval events: This morning he wants some peanut butter and crackers, no other complaints   Assessment & Plan: Principal Problem Cellulitis of the left second toe/acute osteomyelitis-ESR was 5 and CRP was 0.7.  ABIs were checked and it was 1.1 on the left and 1.09 on the right.  Surgery consulted recommending conservative management for now.  Patient was placed on daptomycin and ceftriaxone with plans for 4 weeks of IV antibiotics with the last 02/17/2021.  A PICC line was placed on 11/3. Check BMP, CBC, CK every 7 days, within acceptable parameters on 11/10, recheck again 11/17  Active Problems Bipolar disorder-continue quetiapine, sertraline, trazodone, hydroxyzine  History of a flutter-continue metoprolol, apixaban.  Diltiazem is on hold due to bradycardia  Seizure disorder-continue lamotrigine, Keppra, Vimpat  Tobacco abuse-cessation has been discussed  Thrombocytopenia-probably due to hep C  Chronic hep  C-compensated  History of mild cognitive impairment-confused at times but easily redirectable  Patient is stable for discharge, awaiting placement as his [name] cannot accept him with IV antibiotics and will need SNF level of care.  Scheduled Meds:  apixaban  5 mg Oral BID   Chlorhexidine Gluconate Cloth  6 each Topical Daily   hydrOXYzine  25 mg Oral BID   lacosamide  150 mg Oral BID   lamoTRIgine  25 mg Oral Daily   levETIRAcetam  1,500 mg Oral BID   metoprolol succinate  25 mg Oral Daily   nicotine  14 mg Transdermal Daily   QUEtiapine  50 mg Oral BID   And   QUEtiapine  200 mg Oral QHS   sodium chloride flush  10-40 mL Intracatheter Q12H   Continuous Infusions:  cefTRIAXone (ROCEPHIN)  IV Stopped (01/31/21 1436)   DAPTOmycin (CUBICIN)  IV Stopped (01/31/21 2050)   PRN Meds:.acetaminophen **OR** acetaminophen, haloperidol lactate, sodium chloride flush  Diet Orders (From admission, onward)     Start     Ordered   01/20/21 1612  Diet Heart Room service appropriate? Yes; Fluid consistency: Thin  Diet effective now       Question Answer Comment  Room service appropriate? Yes   Fluid consistency: Thin      01/20/21 1611            DVT prophylaxis:  apixaban (ELIQUIS) tablet 5 mg     Code Status: Full Code  Family Communication: No family at bedside  Status is: Inpatient  Remains inpatient appropriate because: unsafe DC, placement pending  Level of care: Med-Surg  Consultants:  General surgery   Procedures:  none  Microbiology  None   Antimicrobials: Daptomycin  Ceftriaxone   Objective: Vitals:   01/31/21 0808 01/31/21 1437 01/31/21 2239 02/01/21 0636  BP: 116/76 114/73 (!) 130/91 118/70  Pulse: 72 64 74 68  Resp:  20 20 18   Temp:   98 F (36.7 C) 97.9 F (36.6 C)  TempSrc:   Oral   SpO2:  96% 98% 94%  Weight:      Height:        Intake/Output Summary (Last 24 hours) at 02/01/2021 1018 Last data filed at 01/31/2021 1900 Gross per 24 hour   Intake 700 ml  Output 900 ml  Net -200 ml    Filed Weights   01/20/21 1219 01/20/21 1944  Weight: 112.5 kg 106.5 kg    Examination:  Constitutional: No distress  Data Reviewed: I have independently reviewed following labs and imaging studies   CBC: Recent Labs  Lab 01/28/21 0507  WBC 7.0  HGB 15.3  HCT 46.8  MCV 88.8  PLT 133*    Basic Metabolic Panel: Recent Labs  Lab 01/28/21 0507  NA 140  K 4.7  CL 103  CO2 29  GLUCOSE 106*  BUN 20  CREATININE 0.94  CALCIUM 8.8*    Liver Function Tests: Recent Labs  Lab 01/28/21 0507  AST 32  ALT 34  ALKPHOS 70  BILITOT 0.5  PROT 6.7  ALBUMIN 3.4*    Coagulation Profile: No results for input(s): INR, PROTIME in the last 168 hours. HbA1C: No results for input(s): HGBA1C in the last 72 hours. CBG: No results for input(s): GLUCAP in the last 168 hours.  No results found for this or any previous visit (from the past 240 hour(s)).     Radiology Studies: No results found.   Marzetta Board, MD, PhD [facility]  Between 7 am - 7 pm I am available, please contact me via Amion (for emergencies) or Securechat (non urgent messages)  Between 7 pm - 7 am I am not available, please contact night coverage MD/APP via Amion

## 2021-02-02 DIAGNOSIS — M86179 Other acute osteomyelitis, unspecified ankle and foot: Secondary | ICD-10-CM | POA: Diagnosis not present

## 2021-02-02 NOTE — TOC Progression Note (Signed)
Transition of Care [facility]) - Progression Note    Patient Details  Name: [name] [name] MRN: [id number] Date of Birth: 1960/12/10  Transition of Care [facility]) CM/SW Contact  Barry Brunner, LCSW Phone Number: 02/02/2021, 2:31 PM  Clinical Narrative:    CSW contacted Debbie with [employer] to inquire about application status. Debbie reported that she is still working to gather information from patient and family for application. TOC to follow.   Expected Discharge Plan: Skilled Nursing Facility Barriers to Discharge: Continued Medical Work up  Expected Discharge Plan and Services Expected Discharge Plan: Skilled Nursing Facility       Living arrangements for the past 2 months: Assisted Living Facility Expected Discharge Date: 01/26/21                                     Social Determinants of Health (SDOH) Interventions    Readmission Risk Interventions Readmission Risk Prevention Plan 01/22/2021  Transportation Screening Complete  HRI or Home Care Consult Complete  Social Work Consult for Recovery Care Planning/Counseling Complete  Palliative Care Screening Not Applicable  Medication Review Ocean[employer]) Complete  Some recent data might be hidden

## 2021-02-02 NOTE — Progress Notes (Signed)
PROGRESS NOTE  [name] [name] XID:[id number] DOB: 1960/05/23 DOA: 01/20/2021 PCP: [name], NP   LOS: 12 days   Brief Narrative / Interim history: 60 y.o. male with medical history of MDD with psychosis, seizure disorder, hypertension, tobacco abuse, cognitive impairment, atrial flutter with RVR, chronic hepatitis C, history of cocaine and alcohol abuse presenting due to abnormality with his left second toe.  The patient is a poor historian.  When asked why he is in the emergency department, he states " they tell me there is something wrong with my foot".  Apparently, the patient was getting his toenails trimmed at his assisted living facility [facility]) when it was noticed there was some abnormality and discoloration of his left second toe.  As result, the patient was sent to the emergency department for further evaluation.  An MRI showed active osteomyelitis in the middle and distal phalanges of the second toe with surrounding cellulitis.  Subjective / 24h Interval events: No complaints this morning  Assessment & Plan: Principal Problem Cellulitis of the left second toe/acute osteomyelitis-ESR was 5 and CRP was 0.7.  ABIs were checked and it was 1.1 on the left and 1.09 on the right.  Surgery consulted recommending conservative management for now.  Patient was placed on daptomycin and ceftriaxone with plans for 4 weeks of IV antibiotics with the last 02/17/2021.  A PICC line was placed on 11/3. Check BMP, CBC, CK every 7 days, within acceptable parameters on 11/10, recheck again 11/17  Active Problems Bipolar disorder-continue quetiapine, sertraline, trazodone, hydroxyzine  History of a flutter-continue metoprolol, apixaban.  Diltiazem is on hold due to bradycardia  Seizure disorder-continue lamotrigine, Keppra, Vimpat  Tobacco abuse-cessation has been discussed  Thrombocytopenia-probably due to hep C  Chronic hep C-compensated  History of mild cognitive impairment-confused at  times but easily redirectable  Patient is stable for discharge, awaiting placement as his [name] cannot accept him with IV antibiotics and will need SNF level of care.  Scheduled Meds:  apixaban  5 mg Oral BID   Chlorhexidine Gluconate Cloth  6 each Topical Daily   hydrOXYzine  25 mg Oral BID   lacosamide  150 mg Oral BID   lamoTRIgine  25 mg Oral Daily   levETIRAcetam  1,500 mg Oral BID   metoprolol succinate  25 mg Oral Daily   nicotine  14 mg Transdermal Daily   QUEtiapine  50 mg Oral BID   And   QUEtiapine  200 mg Oral QHS   sodium chloride flush  10-40 mL Intracatheter Q12H   Continuous Infusions:  cefTRIAXone (ROCEPHIN)  IV Stopped (02/01/21 1500)   DAPTOmycin (CUBICIN)  IV Stopped (02/01/21 2040)   PRN Meds:.acetaminophen **OR** acetaminophen, haloperidol lactate, sodium chloride flush  Diet Orders (From admission, onward)     Start     Ordered   01/20/21 1612  Diet Heart Room service appropriate? Yes; Fluid consistency: Thin  Diet effective now       Question Answer Comment  Room service appropriate? Yes   Fluid consistency: Thin      01/20/21 1611            DVT prophylaxis:  apixaban (ELIQUIS) tablet 5 mg     Code Status: Full Code  Family Communication: No family at bedside  Status is: Inpatient  Remains inpatient appropriate because: unsafe DC, placement pending  Level of care: Med-Surg  Consultants:  General surgery   Procedures:  none  Microbiology  None   Antimicrobials: Daptomycin  Ceftriaxone  Objective: Vitals:   02/01/21 0636 02/01/21 1606 02/01/21 2123 02/02/21 0547  BP: 118/70 111/77 (!) 152/80 (!) 101/52  Pulse: 68 69 81 71  Resp: 18 20 17 17   Temp: 97.9 F (36.6 C) 97.6 F (36.4 C) 98.2 F (36.8 C) 98.7 F (37.1 C)  TempSrc:  Oral    SpO2: 94% 99% 100% 92%  Weight:      Height:        Intake/Output Summary (Last 24 hours) at 02/02/2021 0751 Last data filed at 02/01/2021 3235 Gross per 24 hour  Intake 240 ml   Output --  Net 240 ml    Filed Weights   01/20/21 1219 01/20/21 1944  Weight: 112.5 kg 106.5 kg    Examination:  Constitutional: No distress  Data Reviewed: I have independently reviewed following labs and imaging studies   CBC: Recent Labs  Lab 01/28/21 0507  WBC 7.0  HGB 15.3  HCT 46.8  MCV 88.8  PLT 133*    Basic Metabolic Panel: Recent Labs  Lab 01/28/21 0507  NA 140  K 4.7  CL 103  CO2 29  GLUCOSE 106*  BUN 20  CREATININE 0.94  CALCIUM 8.8*    Liver Function Tests: Recent Labs  Lab 01/28/21 0507  AST 32  ALT 34  ALKPHOS 70  BILITOT 0.5  PROT 6.7  ALBUMIN 3.4*    Coagulation Profile: No results for input(s): INR, PROTIME in the last 168 hours. HbA1C: No results for input(s): HGBA1C in the last 72 hours. CBG: No results for input(s): GLUCAP in the last 168 hours.  No results found for this or any previous visit (from the past 240 hour(s)).     Radiology Studies: No results found.   Marzetta Board, MD, PhD [facility]  Between 7 am - 7 pm I am available, please contact me via Amion (for emergencies) or Securechat (non urgent messages)  Between 7 pm - 7 am I am not available, please contact night coverage MD/APP via Amion

## 2021-02-03 DIAGNOSIS — M869 Osteomyelitis, unspecified: Secondary | ICD-10-CM | POA: Diagnosis not present

## 2021-02-03 DIAGNOSIS — I739 Peripheral vascular disease, unspecified: Secondary | ICD-10-CM | POA: Diagnosis not present

## 2021-02-03 DIAGNOSIS — F310 Bipolar disorder, current episode hypomanic: Secondary | ICD-10-CM

## 2021-02-03 DIAGNOSIS — M86172 Other acute osteomyelitis, left ankle and foot: Secondary | ICD-10-CM | POA: Diagnosis not present

## 2021-02-03 MED ORDER — DAPTOMYCIN 500 MG IV SOLR
INTRAVENOUS | Status: AC
  Filled 2021-02-03: qty 20

## 2021-02-03 NOTE — TOC Progression Note (Signed)
Transition of Care [facility]) - Progression Note    Patient Details  Name: Nicola Quesnell MRN: [id number] Date of Birth: 1960-04-01  Transition of Care [facility]) CM/SW Contact  Elliot Gault, LCSW Phone Number: 02/03/2021, 2:42 PM  Clinical Narrative:     TOC following. Spoke with Eunice Blase again at [facility] to inquire on status of admission. Per Eunice Blase, she is continuing to work on pt's admission application. No estimated timeframe for dc. TOC will follow.  Expected Discharge Plan: Skilled Nursing Facility Barriers to Discharge: Continued Medical Work up  Expected Discharge Plan and Services Expected Discharge Plan: Skilled Nursing Facility       Living arrangements for the past 2 months: Assisted Living Facility Expected Discharge Date: 01/26/21                                     Social Determinants of Health (SDOH) Interventions    Readmission Risk Interventions Readmission Risk Prevention Plan 01/22/2021  Transportation Screening Complete  HRI or Home Care Consult Complete  Social Work Consult for Recovery Care Planning/Counseling Complete  Palliative Care Screening Not Applicable  Medication Review [facility]) Complete  Some recent data might be hidden

## 2021-02-03 NOTE — Progress Notes (Signed)
[facility]                                                                              Patient Demographics  [name] [name], is a 60 y.o. male, DOB - Apr 27, 1960, [id number]  Admit date - 01/20/2021   Admitting Physician Orson Eva, MD  Outpatient Primary MD for the patient is [name], NP  Outpatient specialists:   LOS - 13  days   Medical records reviewed and are as summarized below:    Chief Complaint  Patient presents with   Toe Pain       Brief summary   60 y.o. male with medical history of MDD with psychosis, seizure disorder, hypertension, tobacco abuse, cognitive impairment, atrial flutter with RVR, chronic hepatitis C, history of cocaine and alcohol abuse presenting due to abnormality with his left second toe.  The patient is a poor historian.  When asked why he is in the emergency department, he states " they tell me there is something wrong with my foot".  Apparently, the patient was getting his toenails trimmed at his assisted living facility [facility]) when it was noticed there was some abnormality and discoloration of his left second toe.  As result, the patient was sent to ED for further evaluation.  MRI showed active osteomyelitis in the middle and distal phalanges of the second toe with surrounding cellulitis.   Assessment & Plan     Acute osteomyelitis of toe ([place]), second toe of the left foot, cellulitis -ABI 1.1 on the left, 1.09 on the right. -Surgery was consulted, recommended conservative management -Placed on daptomycin and ceftriaxone with plan for 4 weeks of IV antibiotics, last dose on 02/17/2021 -PICC line placed on 11/3, followed BMET, CBC, CK weekly, will recheck in a.m. on 11/17 -Awaiting skilled nursing facility placement for completion of antibiotics  Bipolar disorder -Stable, continue quetiapine, sertraline, trazodone, hydroxyzine  History of atrial flutter -Continue metoprolol, apixaban.  Diltiazem on hold due  to bradycardia  Seizure disorder -Continue lamotrigine, Keppra, Vimpat  Tobacco use -Counseled on smoking cessation  Chronic hepatitis C -No acute issues  Chronic thrombocytopenia -Likely due to hepatitis C    Code Status: Full code DVT Prophylaxis:   apixaban (ELIQUIS) tablet 5 mg   Level of Care: Level of care: Med-Surg Family Communication: Discussed all imaging results, lab results, explained to the patient   Disposition Plan:     Status is: Inpatient  Remains inpatient appropriate because: Awaiting skilled nursing facility otherwise medically clear      Time Spent in minutes   25 minutes   Procedures:  None  Consultants:   Surgery  Antimicrobials:   Anti-infectives (From admission, onward)    Start     Dose/Rate Route Frequency Ordered Stop   01/25/21 0000  cefTRIAXone 2 g in sodium chloride 0.9 % 100 mL        2 g Intravenous Every 24 hours 01/24/21 1616     01/24/21 0000  DAPTOmycin 700 mg in sodium chloride 0.9 % 50 mL        700 mg Intravenous Daily 01/24/21 1616     01/22/21 1600  DAPTOmycin (CUBICIN) 700 mg in sodium chloride 0.9 % IVPB        700 mg 128 mL/hr over 30 Minutes Intravenous Daily 01/22/21 1248     01/21/21 1330  cefTRIAXone (ROCEPHIN) 2 g in sodium chloride 0.9 % 100 mL IVPB        2 g 200 mL/hr over 30 Minutes Intravenous Every 24 hours 01/21/21 1238     01/21/21 0030  vancomycin (VANCOREADY) IVPB 1500 mg/300 mL  Status:  Discontinued       See Hyperspace for full Linked Orders Report.   1,500 mg 150 mL/hr over 120 Minutes Intravenous Every 12 hours 01/20/21 1226 01/22/21 1248   01/20/21 1545  ceFEPIme (MAXIPIME) 2 g in sodium chloride 0.9 % 100 mL IVPB  Status:  Discontinued        2 g 200 mL/hr over 30 Minutes Intravenous Every 8 hours 01/20/21 1540 01/21/21 1238   01/20/21 1230  vancomycin (VANCOREADY) IVPB 2000 mg/400 mL       See Hyperspace for full Linked Orders Report.   2,000 mg 200 mL/hr over 120 Minutes Intravenous   Once 01/20/21 1226 01/20/21 1450          Medications  Scheduled Meds:  apixaban  5 mg Oral BID   Chlorhexidine Gluconate Cloth  6 each Topical Daily   hydrOXYzine  25 mg Oral BID   lacosamide  150 mg Oral BID   lamoTRIgine  25 mg Oral Daily   levETIRAcetam  1,500 mg Oral BID   metoprolol succinate  25 mg Oral Daily   nicotine  14 mg Transdermal Daily   QUEtiapine  50 mg Oral BID   And   QUEtiapine  200 mg Oral QHS   sodium chloride flush  10-40 mL Intracatheter Q12H   Continuous Infusions:  cefTRIAXone (ROCEPHIN)  IV Stopped (02/02/21 1420)   DAPTOmycin (CUBICIN)  IV Stopped (02/02/21 2000)   PRN Meds:.acetaminophen **OR** acetaminophen, haloperidol lactate, sodium chloride flush      Subjective:   [name] [name] was seen and examined today.  No acute complaints, afebrile.  Patient denies dizziness, chest pain, shortness of breath, abdominal pain, N/V,  No acute events overnight.    Objective:   Vitals:   02/02/21 0547 02/02/21 1332 02/02/21 2044 02/03/21 1253  BP: (!) 101/52 120/86 114/88 134/90  Pulse: 71 65 62 (!) 59  Resp: 17 19 18 17   Temp: 98.7 F (37.1 C) 97.6 F (36.4 C) 97.6 F (36.4 C) 98.4 F (36.9 C)  TempSrc:  Oral Oral Oral  SpO2: 92% 98% 98% 99%  Weight:      Height:        Intake/Output Summary (Last 24 hours) at 02/03/2021 1400 Last data filed at 02/03/2021 [id number] Gross per 24 hour  Intake 1352 ml  Output 1850 ml  Net -498 ml     Wt Readings from Last 3 Encounters:  01/20/21 106.5 kg  05/31/18 91.8 kg  01/24/18 86.2 kg     Exam General: Alert and oriented x 3, NAD Cardiovascular: S1 S2 auscultated, RRR Respiratory: Clear to auscultation bilaterally Gastrointestinal: Soft, nontender, nondistended, + bowel sounds Ext: no pedal edema bilaterally Neuro: no new deficits Skin: Wound/gangrene of left second toe Psych: Normal affect and demeanor, alert and oriented x3    Data Reviewed:  I have personally reviewed following  labs and imaging studies  Micro Results No results found for this or any previous visit (from the past 240 hour(s)).  Radiology Reports MR FOOT  LEFT W WO CONTRAST  Result Date: 01/21/2021 CLINICAL DATA:  Cellulitis of the left second toe EXAM: MRI OF THE LEFT FOREFOOT WITHOUT AND WITH CONTRAST TECHNIQUE: Multiplanar, multisequence MR imaging of the left forefoot was performed both before and after administration of intravenous contrast. CONTRAST:  28mL GADAVIST GADOBUTROL 1 MMOL/ML IV SOLN COMPARISON:  01/20/2021 FINDINGS: Bones/Joint/Cartilage Diffuse edema signal and enhancement in the middle and distal phalanges of the second toe compatible with active osteomyelitis. There is deformity of the distal phalanx of the great toe with scalloping of the tuft but with little in the way of edema. This may be the result of chronic deformity or remote osteomyelitis but we do not demonstrate striking edema or enhancement in the remaining distal phalanx a thing this less likely to be from acute osteomyelitis. There is some thinning of the overlying soft tissues of the distal toe. Correlate patient history. 5 mm degenerative subcortical cystic lesion or geode in the proximal navicular. Ligaments Lisfranc ligament intact Muscles and Tendons Regional muscular atrophy Soft tissues Subcutaneous edema and enhancement in the second toe compatible with cellulitis. Dorsal subcutaneous edema in the forefoot is not a complete by enhancement and accordingly is not necessarily infectious. IMPRESSION: 1. Active osteomyelitis in the middle and distal phalanges of the second toe with surrounding cellulitis. 2. Deformity of the distal phalanx of the great toe but without abnormal edema or enhancement either in the distal phalanx or in the surrounding soft tissues. Accordingly this is more likely to represent deformity from an old/chronic process rather than acute osteomyelitis. Electronically Signed   By: Van Clines M.D.   On:  01/21/2021 06:45   US ARTERIAL ABI (SCREENING LOWER EXTREMITY)  Result Date: 01/21/2021 CLINICAL DATA:  Hypertension Current smoker Left second toe cellulitis Left breast pain EXAM: NONINVASIVE PHYSIOLOGIC VASCULAR STUDY OF BILATERAL LOWER EXTREMITIES TECHNIQUE: Evaluation of both lower extremities were performed at rest, including calculation of ankle-brachial indices with single level Doppler, pressure and pulse volume recording. COMPARISON:  None. FINDINGS: Right ABI:  1.10 Left ABI:  1.09 Right Lower Extremity: Monophasic waveform seen in the right posterior tibial and dorsalis pedis arteries. Left Lower Extremity: Monophasic waveform seen in the posterior tibial and dorsalis pedis arteries. 1.0-1.4 Normal IMPRESSION: Although lower extremity ABI values are within normal limits, monophasic waveforms seen at the ankle bilaterally are suspicious for underlying arterial occlusive disease. Consider further evaluation with CT angiography of the abdominal aorta with runoff. Electronically Signed   By: Miachel Roux M.D.   On: 01/21/2021 11:43   DG Foot Complete Left  Result Date: 01/20/2021 CLINICAL DATA:  Second left toe wound EXAM: LEFT FOOT - COMPLETE 3+ VIEW COMPARISON:  None. FINDINGS: Soft tissue swelling of the distal second digit with erosion of the underlying middle and distal phalanx consistent with osteomyelitis. Erosive changes also noted in the distal tip of the distal phalanx of the great toe with overlying soft tissue abnormality suspicious for chronic osteomyelitis. Atherosclerotic changes seen throughout visualized arterial segments. IMPRESSION: Findings suspicious for osteomyelitis of the tip of the second and first toes. Electronically Signed   By: Miachel Roux M.D.   On: 01/20/2021 11:47   [place] EKG SITE RITE  Result Date: 01/21/2021 If Site Rite image not attached, placement could not be confirmed due to current cardiac rhythm.   Lab Data:  CBC: Recent Labs  Lab 01/28/21 0507  WBC  7.0  HGB 15.3  HCT 46.8  MCV 88.8  PLT [id number]*   Basic Metabolic Panel:  Recent Labs  Lab 01/28/21 0507  NA 140  K 4.7  CL 103  CO2 29  GLUCOSE 106*  BUN 20  CREATININE 0.94  CALCIUM 8.8*   GFR: Estimated Creatinine Clearance: 102.7 mL/min (by C-G formula based on SCr of 0.94 mg/dL). Liver Function Tests: Recent Labs  Lab 01/28/21 0507  AST 32  ALT 34  ALKPHOS 70  BILITOT 0.5  PROT 6.7  ALBUMIN 3.4*   No results for input(s): LIPASE, AMYLASE in the last 168 hours. No results for input(s): AMMONIA in the last 168 hours. Coagulation Profile: No results for input(s): INR, PROTIME in the last 168 hours. Cardiac Enzymes: Recent Labs  Lab 01/28/21 0507  CKTOTAL 59   BNP (last 3 results) No results for input(s): PROBNP in the last 8760 hours. HbA1C: No results for input(s): HGBA1C in the last 72 hours. CBG: No results for input(s): GLUCAP in the last 168 hours. Lipid Profile: No results for input(s): CHOL, HDL, LDLCALC, TRIG, CHOLHDL, LDLDIRECT in the last 72 hours. Thyroid Function Tests: No results for input(s): TSH, T4TOTAL, FREET4, T3FREE, THYROIDAB in the last 72 hours. Anemia Panel: No results for input(s): VITAMINB12, FOLATE, FERRITIN, TIBC, IRON, RETICCTPCT in the last 72 hours. Urine analysis:    Component Value Date/Time   COLORURINE YELLOW 12/12/2017 0931   APPEARANCEUR CLEAR 12/12/2017 0931   LABSPEC 1.023 12/12/2017 0931   PHURINE 6.0 12/12/2017 0931   GLUCOSEU NEGATIVE 12/12/2017 0931   HGBUR NEGATIVE 12/12/2017 0931   BILIRUBINUR NEGATIVE 12/12/2017 0931   BILIRUBINUR Small 10/19/2015 1526   KETONESUR NEGATIVE 12/12/2017 0931   PROTEINUR NEGATIVE 12/12/2017 0931   UROBILINOGEN >=8.0 10/19/2015 1526   UROBILINOGEN 0.2 10/31/2014 1840   NITRITE NEGATIVE 12/12/2017 0931   LEUKOCYTESUR NEGATIVE 12/12/2017 0931     Deniss Wormley M.D. [facility] 02/03/2021, 2:00 PM  Available via Epic secure chat 7am-7pm After 7 pm, please refer to  night coverage provider listed on amion.

## 2021-02-04 DIAGNOSIS — I739 Peripheral vascular disease, unspecified: Secondary | ICD-10-CM | POA: Diagnosis not present

## 2021-02-04 DIAGNOSIS — F310 Bipolar disorder, current episode hypomanic: Secondary | ICD-10-CM | POA: Diagnosis not present

## 2021-02-04 DIAGNOSIS — M86172 Other acute osteomyelitis, left ankle and foot: Secondary | ICD-10-CM | POA: Diagnosis not present

## 2021-02-04 DIAGNOSIS — M869 Osteomyelitis, unspecified: Secondary | ICD-10-CM | POA: Diagnosis not present

## 2021-02-04 LAB — CBC
HCT: 47.1 % (ref 39.0–52.0)
Hemoglobin: 15.3 g/dL (ref 13.0–17.0)
MCH: 29.5 pg (ref 26.0–34.0)
MCHC: 32.5 g/dL (ref 30.0–36.0)
MCV: 90.9 fL (ref 80.0–100.0)
Platelets: 130 10*3/uL — ABNORMAL LOW (ref 150–400)
RBC: 5.18 MIL/uL (ref 4.22–5.81)
RDW: 17.4 % — ABNORMAL HIGH (ref 11.5–15.5)
WBC: 6.9 10*3/uL (ref 4.0–10.5)
nRBC: 0 % (ref 0.0–0.2)

## 2021-02-04 LAB — COMPREHENSIVE METABOLIC PANEL
ALT: 40 U/L (ref 0–44)
AST: 40 U/L (ref 15–41)
Albumin: 3.5 g/dL (ref 3.5–5.0)
Alkaline Phosphatase: 104 U/L (ref 38–126)
Anion gap: 9 (ref 5–15)
BUN: 12 mg/dL (ref 8–23)
CO2: 25 mmol/L (ref 22–32)
Calcium: 8.6 mg/dL — ABNORMAL LOW (ref 8.9–10.3)
Chloride: 104 mmol/L (ref 98–111)
Creatinine, Ser: 0.8 mg/dL (ref 0.61–1.24)
GFR, Estimated: >60 mL/min (ref >60)
Glucose, Bld: 122 mg/dL — ABNORMAL HIGH (ref 70–99)
Potassium: 3.7 mmol/L (ref 3.5–5.1)
Sodium: 138 mmol/L (ref 135–145)
Total Bilirubin: 0.6 mg/dL (ref 0.3–1.2)
Total Protein: 7.1 g/dL (ref 6.5–8.1)

## 2021-02-04 LAB — CK: Total CK: 71 U/L (ref 49–397)

## 2021-02-04 NOTE — Progress Notes (Signed)
[facility]                                                                              Patient Demographics  [name] [name], is a 60 y.o. male, DOB - [birth year], YYF:[id number]  Admit date - 01/20/2021   Admitting Physician Orson Eva, MD  Outpatient Primary MD for the patient is [name], NP  Outpatient specialists:   LOS - 14  days   Medical records reviewed and are as summarized below:    Chief Complaint  Patient presents with   Toe Pain       Brief summary   60 y.o. male with medical history of MDD with psychosis, seizure disorder, hypertension, tobacco abuse, cognitive impairment, atrial flutter with RVR, chronic hepatitis C, history of cocaine and alcohol abuse presenting due to abnormality with his left second toe.  The patient is a poor historian.  When asked why he is in the emergency department, he states " they tell me there is something wrong with my foot".  Apparently, the patient was getting his toenails trimmed at his assisted living facility [facility]) when it was noticed there was some abnormality and discoloration of his left second toe.  As result, the patient was sent to ED for further evaluation.  MRI showed active osteomyelitis in the middle and distal phalanges of the second toe with surrounding cellulitis.   Assessment & Plan     Acute osteomyelitis of toe ([place]), second toe of the left foot, cellulitis -ABI 1.1 on the left, 1.09 on the right. -Surgery was consulted, recommended conservative management -Placed on daptomycin and ceftriaxone with plan for 4 weeks of IV antibiotics, last dose on 02/17/2021 -PICC line placed on 11/3, -CBC, c-Met, stable, platelets 130 K, CK 71, next labs on 11/24  -Awaiting skilled nursing facility placement for completion of antibiotics  Bipolar disorder -Stable, continue quetiapine, sertraline, trazodone, hydroxyzine  History of atrial flutter -Continue metoprolol, apixaban.  Diltiazem on  hold due to bradycardia  Seizure disorder -Continue lamotrigine, Keppra, Vimpat  Tobacco use -Counseled on smoking cessation  Chronic hepatitis C -No acute issues  Chronic thrombocytopenia -Likely due to hepatitis C, platelet count stable although on lower side.    Code Status: Full code DVT Prophylaxis:   apixaban (ELIQUIS) tablet 5 mg   Level of Care: Level of care: Med-Surg Family Communication: Discussed all imaging results, lab results, explained to the patient   Disposition Plan:     Status is: Inpatient  Remains inpatient appropriate because: Awaiting skilled nursing facility otherwise medically clear    Time Spent in minutes 15 minutes  Procedures:  None  Consultants:   Surgery  Antimicrobials:   Anti-infectives (From admission, onward)    Start     Dose/Rate Route Frequency Ordered Stop   01/25/21 0000  cefTRIAXone 2 g in sodium chloride 0.9 % 100 mL        2 g Intravenous Every 24 hours 01/24/21 1616     01/24/21 0000  DAPTOmycin 700 mg in sodium chloride 0.9 % 50 mL        700 mg Intravenous Daily 01/24/21 1616  01/22/21 1600  DAPTOmycin (CUBICIN) 700 mg in sodium chloride 0.9 % IVPB        700 mg 128 mL/hr over 30 Minutes Intravenous Daily 01/22/21 1248     01/21/21 1330  cefTRIAXone (ROCEPHIN) 2 g in sodium chloride 0.9 % 100 mL IVPB        2 g 200 mL/hr over 30 Minutes Intravenous Every 24 hours 01/21/21 1238     01/21/21 0030  vancomycin (VANCOREADY) IVPB 1500 mg/300 mL  Status:  Discontinued       See Hyperspace for full Linked Orders Report.   1,500 mg 150 mL/hr over 120 Minutes Intravenous Every 12 hours 01/20/21 1226 01/22/21 1248   01/20/21 1545  ceFEPIme (MAXIPIME) 2 g in sodium chloride 0.9 % 100 mL IVPB  Status:  Discontinued        2 g 200 mL/hr over 30 Minutes Intravenous Every 8 hours 01/20/21 1540 01/21/21 1238   01/20/21 1230  vancomycin (VANCOREADY) IVPB 2000 mg/400 mL       See Hyperspace for full Linked Orders Report.    2,000 mg 200 mL/hr over 120 Minutes Intravenous  Once 01/20/21 1226 01/20/21 1450          Medications  Scheduled Meds:  apixaban  5 mg Oral BID   Chlorhexidine Gluconate Cloth  6 each Topical Daily   hydrOXYzine  25 mg Oral BID   lacosamide  150 mg Oral BID   lamoTRIgine  25 mg Oral Daily   levETIRAcetam  1,500 mg Oral BID   metoprolol succinate  25 mg Oral Daily   nicotine  14 mg Transdermal Daily   QUEtiapine  50 mg Oral BID   And   QUEtiapine  200 mg Oral QHS   sodium chloride flush  10-40 mL Intracatheter Q12H   Continuous Infusions:  cefTRIAXone (ROCEPHIN)  IV 2 g (02/03/21 1618)   DAPTOmycin (CUBICIN)  IV 700 mg (02/03/21 2136)   PRN Meds:.acetaminophen **OR** acetaminophen, haloperidol lactate, sodium chloride flush      Subjective:   [name] [name] was seen and examined today.  No acute complaints, overnight slept well.  Awaiting skilled nursing facility.    Objective:   Vitals:   02/03/21 1253 02/03/21 2114 02/04/21 0442 02/04/21 1329  BP: 134/90 129/90 115/74 130/73  Pulse: (!) 59 (!) 55 62 60  Resp: 17 20 18 20   Temp: 98.4 F (36.9 C) (!) 97.4 F (36.3 C) 98.6 F (37 C) 97.7 F (36.5 C)  TempSrc: Oral Oral  Oral  SpO2: 99% 99% 100% 98%  Weight:      Height:        Intake/Output Summary (Last 24 hours) at 02/04/2021 1452 Last data filed at 02/04/2021 1300 Gross per 24 hour  Intake 1680 ml  Output 1350 ml  Net 330 ml     Wt Readings from Last 3 Encounters:  01/20/21 106.5 kg  05/31/18 91.8 kg  01/24/18 86.2 kg    Physical Exam General: Alert and oriented x 3, NAD Cardiovascular: S1 S2 clear, RRR.  Respiratory: CTAB, no wheezing, rales or rhonchi Gastrointestinal: Soft, nontender, nondistended, NBS Ext: no pedal edema bilaterally, PICC line right upper arm Neuro: no new deficits Skin: Wound left second toe Psych: Normal affect and demeanor, alert and oriented x3    Data Reviewed:  I have personally reviewed following labs  and imaging studies  Micro Results No results found for this or any previous visit (from the past 240 hour(s)).  Radiology Reports MR  FOOT LEFT W WO CONTRAST  Result Date: 01/21/2021 CLINICAL DATA:  Cellulitis of the left second toe EXAM: MRI OF THE LEFT FOREFOOT WITHOUT AND WITH CONTRAST TECHNIQUE: Multiplanar, multisequence MR imaging of the left forefoot was performed both before and after administration of intravenous contrast. CONTRAST:  86m GADAVIST GADOBUTROL 1 MMOL/ML IV SOLN COMPARISON:  01/20/2021 FINDINGS: Bones/Joint/Cartilage Diffuse edema signal and enhancement in the middle and distal phalanges of the second toe compatible with active osteomyelitis. There is deformity of the distal phalanx of the great toe with scalloping of the tuft but with little in the way of edema. This may be the result of chronic deformity or remote osteomyelitis but we do not demonstrate striking edema or enhancement in the remaining distal phalanx a thing this less likely to be from acute osteomyelitis. There is some thinning of the overlying soft tissues of the distal toe. Correlate patient history. 5 mm degenerative subcortical cystic lesion or geode in the proximal navicular. Ligaments Lisfranc ligament intact Muscles and Tendons Regional muscular atrophy Soft tissues Subcutaneous edema and enhancement in the second toe compatible with cellulitis. Dorsal subcutaneous edema in the forefoot is not a complete by enhancement and accordingly is not necessarily infectious. IMPRESSION: 1. Active osteomyelitis in the middle and distal phalanges of the second toe with surrounding cellulitis. 2. Deformity of the distal phalanx of the great toe but without abnormal edema or enhancement either in the distal phalanx or in the surrounding soft tissues. Accordingly this is more likely to represent deformity from an old/chronic process rather than acute osteomyelitis. Electronically Signed   By: WVan ClinesM.D.   On:  01/21/2021 06:45   [place] ABI (SCREENING LOWER EXTREMITY)  Result Date: 01/21/2021 CLINICAL DATA:  Hypertension Current smoker Left second toe cellulitis Left breast pain EXAM: NONINVASIVE PHYSIOLOGIC VASCULAR STUDY OF BILATERAL LOWER EXTREMITIES TECHNIQUE: Evaluation of both lower extremities were performed at rest, including calculation of ankle-brachial indices with single level Doppler, pressure and pulse volume recording. COMPARISON:  None. FINDINGS: Right ABI:  1.10 Left ABI:  1.09 Right Lower Extremity: Monophasic waveform seen in the right posterior tibial and dorsalis pedis arteries. Left Lower Extremity: Monophasic waveform seen in the posterior tibial and dorsalis pedis arteries. 1.0-1.4 Normal IMPRESSION: Although lower extremity ABI values are within normal limits, monophasic waveforms seen at the ankle bilaterally are suspicious for underlying arterial occlusive disease. Consider further evaluation with CT angiography of the abdominal aorta with runoff. Electronically Signed   By: FMiachel RouxM.D.   On: 01/21/2021 11:43   DG Foot Complete Left  Result Date: 01/20/2021 CLINICAL DATA:  Second left toe wound EXAM: LEFT FOOT - COMPLETE 3+ VIEW COMPARISON:  None. FINDINGS: Soft tissue swelling of the distal second digit with erosion of the underlying middle and distal phalanx consistent with osteomyelitis. Erosive changes also noted in the distal tip of the distal phalanx of the great toe with overlying soft tissue abnormality suspicious for chronic osteomyelitis. Atherosclerotic changes seen throughout visualized arterial segments. IMPRESSION: Findings suspicious for osteomyelitis of the tip of the second and first toes. Electronically Signed   By: FMiachel RouxM.D.   On: 01/20/2021 11:47   [place] SITE RITE  Result Date: 01/21/2021 If Site Rite image not attached, placement could not be confirmed due to current cardiac rhythm.   Lab Data:  CBC: Recent Labs  Lab 02/04/21 0627  WBC  6.9  HGB 15.3  HCT 47.1  MCV 90.9  PLT 1938   Basic Metabolic  Panel: Recent Labs  Lab 02/04/21 0627  NA 138  K 3.7  CL 104  CO2 25  GLUCOSE 122*  BUN 12  CREATININE 0.80  CALCIUM 8.6*   GFR: Estimated Creatinine Clearance: 120.7 mL/min (by C-G formula based on SCr of 0.8 mg/dL). Liver Function Tests: Recent Labs  Lab 02/04/21 0627  AST 40  ALT 40  ALKPHOS 104  BILITOT 0.6  PROT 7.1  ALBUMIN 3.5   No results for input(s): LIPASE, AMYLASE in the last 168 hours. No results for input(s): AMMONIA in the last 168 hours. Coagulation Profile: No results for input(s): INR, PROTIME in the last 168 hours. Cardiac Enzymes: Recent Labs  Lab 02/04/21 0627  CKTOTAL 71   BNP (last 3 results) No results for input(s): PROBNP in the last 8760 hours. HbA1C: No results for input(s): HGBA1C in the last 72 hours. CBG: No results for input(s): GLUCAP in the last 168 hours. Lipid Profile: No results for input(s): CHOL, HDL, LDLCALC, TRIG, CHOLHDL, LDLDIRECT in the last 72 hours. Thyroid Function Tests: No results for input(s): TSH, T4TOTAL, FREET4, T3FREE, THYROIDAB in the last 72 hours. Anemia Panel: No results for input(s): VITAMINB12, FOLATE, FERRITIN, TIBC, IRON, RETICCTPCT in the last 72 hours. Urine analysis:    Component Value Date/Time   COLORURINE YELLOW 12/12/2017 [place] 12/12/2017 0931   LABSPEC 1.023 12/12/2017 0931   PHURINE 6.0 12/12/2017 0931   GLUCOSEU NEGATIVE 12/12/2017 0931   HGBUR NEGATIVE 12/12/2017 0931   BILIRUBINUR NEGATIVE 12/12/2017 0931   BILIRUBINUR Small 10/19/2015 1526   KETONESUR NEGATIVE 12/12/2017 0931   PROTEINUR NEGATIVE 12/12/2017 0931   UROBILINOGEN >=8.0 10/19/2015 1526   UROBILINOGEN 0.2 10/31/2014 1840   NITRITE NEGATIVE 12/12/2017 0931   LEUKOCYTESUR NEGATIVE 12/12/2017 0931     Samiya Mervin M.D. [facility] 02/04/2021, 2:52 PM  Available via Epic secure chat 7am-7pm After 7 pm, please refer to  night coverage provider listed on amion.

## 2021-02-05 DIAGNOSIS — F310 Bipolar disorder, current episode hypomanic: Secondary | ICD-10-CM | POA: Diagnosis not present

## 2021-02-05 DIAGNOSIS — I739 Peripheral vascular disease, unspecified: Secondary | ICD-10-CM | POA: Diagnosis not present

## 2021-02-05 DIAGNOSIS — M86172 Other acute osteomyelitis, left ankle and foot: Secondary | ICD-10-CM | POA: Diagnosis not present

## 2021-02-05 DIAGNOSIS — M869 Osteomyelitis, unspecified: Secondary | ICD-10-CM | POA: Diagnosis not present

## 2021-02-05 NOTE — Progress Notes (Signed)
[facility]                                                                              Patient Demographics  [name] [name], is a 60 y.o. male, DOB - 27-Oct-1960, [id number]  Admit date - 01/20/2021   Admitting Physician Catarina Hartshorn, MD  Outpatient Primary MD for the patient is Dairl Ponder, NP  Outpatient specialists:   LOS - 15  days   Medical records reviewed and are as summarized below:    Chief Complaint  Patient presents with   Toe Pain       Brief summary   60 y.o. male with medical history of MDD with psychosis, seizure disorder, hypertension, tobacco abuse, cognitive impairment, atrial flutter with RVR, chronic hepatitis C, history of cocaine and alcohol abuse presenting due to abnormality with his left second toe.  The patient is a poor historian.  When asked why he is in the emergency department, he states " they tell me there is something wrong with my foot".  Apparently, the patient was getting his toenails trimmed at his assisted living facility [facility]) when it was noticed there was some abnormality and discoloration of his left second toe.  As result, the patient was sent to ED for further evaluation.  MRI showed active osteomyelitis in the middle and distal phalanges of the second toe with surrounding cellulitis.   Assessment & Plan     Acute osteomyelitis of toe ([facility]), second toe of the left foot, cellulitis -ABI 1.1 on the left, 1.09 on the right. -Surgery was consulted, recommended conservative management -Placed on daptomycin and ceftriaxone with plan for 4 weeks of IV antibiotics, last dose on 02/17/2021 -PICC line placed on 11/3, -Labs on 11/17 CBC, CMET stable, CK 71, next labs on 11/24  -Awaiting skilled nursing facility placement for completion of antibiotics  Bipolar disorder -Stable, no acute issues.  Continue Seroquel, sertraline, trazodone, hydroxyzine.    History of atrial flutter -Continue metoprolol, apixaban.    -Diltiazem on hold due to bradycardia   Seizure disorder -Continue lamotrigine, Keppra, Vimpat  Tobacco use -Counseled on smoking cessation  Chronic hepatitis C -No acute issues  Chronic thrombocytopenia -Likely due to hepatitis C, platelet count has been stable.  Code Status: Full code DVT Prophylaxis:   apixaban (ELIQUIS) tablet 5 mg   Level of Care: Level of care: Med-Surg Family Communication: Discussed all imaging results, lab results, explained to the patient   Disposition Plan:     Status is: Inpatient  Remains inpatient appropriate because: Awaiting skilled nursing facility otherwise medically clear    Time Spent in minutes 15 minutes  Procedures:  None  Consultants:   Surgery  Antimicrobials:   Anti-infectives (From admission, onward)    Start     Dose/Rate Route Frequency Ordered Stop   01/25/21 0000  cefTRIAXone 2 g in sodium chloride 0.9 % 100 mL        2 g Intravenous Every 24 hours 01/24/21 1616     01/24/21 0000  DAPTOmycin 700 mg in sodium chloride 0.9 % 50 mL        700 mg Intravenous Daily 01/24/21  1616     01/22/21 1600  DAPTOmycin (CUBICIN) 700 mg in sodium chloride 0.9 % IVPB        700 mg 128 mL/hr over 30 Minutes Intravenous Daily 01/22/21 1248     01/21/21 1330  cefTRIAXone (ROCEPHIN) 2 g in sodium chloride 0.9 % 100 mL IVPB        2 g 200 mL/hr over 30 Minutes Intravenous Every 24 hours 01/21/21 1238     01/21/21 0030  vancomycin (VANCOREADY) IVPB 1500 mg/300 mL  Status:  Discontinued       See Hyperspace for full Linked Orders Report.   1,500 mg 150 mL/hr over 120 Minutes Intravenous Every 12 hours 01/20/21 1226 01/22/21 1248   01/20/21 1545  ceFEPIme (MAXIPIME) 2 g in sodium chloride 0.9 % 100 mL IVPB  Status:  Discontinued        2 g 200 mL/hr over 30 Minutes Intravenous Every 8 hours 01/20/21 1540 01/21/21 1238   01/20/21 1230  vancomycin (VANCOREADY) IVPB 2000 mg/400 mL       See Hyperspace for full Linked Orders Report.    2,000 mg 200 mL/hr over 120 Minutes Intravenous  Once 01/20/21 1226 01/20/21 1450          Medications  Scheduled Meds:  apixaban  5 mg Oral BID   Chlorhexidine Gluconate Cloth  6 each Topical Daily   hydrOXYzine  25 mg Oral BID   lacosamide  150 mg Oral BID   lamoTRIgine  25 mg Oral Daily   levETIRAcetam  1,500 mg Oral BID   metoprolol succinate  25 mg Oral Daily   nicotine  14 mg Transdermal Daily   QUEtiapine  50 mg Oral BID   And   QUEtiapine  200 mg Oral QHS   sodium chloride flush  10-40 mL Intracatheter Q12H   Continuous Infusions:  cefTRIAXone (ROCEPHIN)  IV Stopped (02/04/21 1540)   DAPTOmycin (CUBICIN)  IV 700 mg (02/04/21 2108)   PRN Meds:.acetaminophen **OR** acetaminophen, haloperidol lactate, sodium chloride flush      Subjective:   [name] [name] was seen and examined today.  No acute complaints.  No fevers or chills, overnight no issues.  Awaiting skilled nursing facility for IV antibiotics.  Objective:   Vitals:   02/04/21 1329 02/04/21 2005 02/05/21 0454 02/05/21 0830  BP: 130/73 123/74 115/61 108/83  Pulse: 60 64 73 66  Resp: 20 20 20    Temp: 97.7 F (36.5 C) 98.2 F (36.8 C) 98.5 F (36.9 C)   TempSrc: Oral Oral    SpO2: 98% 96% 97%   Weight:      Height:        Intake/Output Summary (Last 24 hours) at 02/05/2021 1103 Last data filed at 02/05/2021 0900 Gross per 24 hour  Intake 1407.46 ml  Output 900 ml  Net 507.46 ml     Wt Readings from Last 3 Encounters:  01/20/21 106.5 kg  05/31/18 91.8 kg  01/24/18 86.2 kg   Physical Exam General: Alert and oriented x 3, NAD Cardiovascular: S1 S2 clear, RRR.  Respiratory: CTAB, no wheezing, rales or rhonchi Gastrointestinal: Soft, nontender, nondistended, NBS Ext: no pedal edema bilaterally, PICC line right upper arm Skin: Wound left second toe   Data Reviewed:  I have personally reviewed following labs and imaging studies  Micro Results No results found for this or any  previous visit (from the past 240 hour(s)).  Radiology Reports MR FOOT LEFT W WO CONTRAST  Result Date: 01/21/2021 CLINICAL DATA:  Cellulitis of the left second toe EXAM: MRI OF THE LEFT FOREFOOT WITHOUT AND WITH CONTRAST TECHNIQUE: Multiplanar, multisequence MR imaging of the left forefoot was performed both before and after administration of intravenous contrast. CONTRAST:  54mL GADAVIST GADOBUTROL 1 MMOL/ML IV SOLN COMPARISON:  01/20/2021 FINDINGS: Bones/Joint/Cartilage Diffuse edema signal and enhancement in the middle and distal phalanges of the second toe compatible with active osteomyelitis. There is deformity of the distal phalanx of the great toe with scalloping of the tuft but with little in the way of edema. This may be the result of chronic deformity or remote osteomyelitis but we do not demonstrate striking edema or enhancement in the remaining distal phalanx a thing this less likely to be from acute osteomyelitis. There is some thinning of the overlying soft tissues of the distal toe. Correlate patient history. 5 mm degenerative subcortical cystic lesion or geode in the proximal navicular. Ligaments Lisfranc ligament intact Muscles and Tendons Regional muscular atrophy Soft tissues Subcutaneous edema and enhancement in the second toe compatible with cellulitis. Dorsal subcutaneous edema in the forefoot is not a complete by enhancement and accordingly is not necessarily infectious. IMPRESSION: 1. Active osteomyelitis in the middle and distal phalanges of the second toe with surrounding cellulitis. 2. Deformity of the distal phalanx of the great toe but without abnormal edema or enhancement either in the distal phalanx or in the surrounding soft tissues. Accordingly this is more likely to represent deformity from an old/chronic process rather than acute osteomyelitis. Electronically Signed   By: Gaylyn Rong M.D.   On: 01/21/2021 06:45   US ARTERIAL ABI (SCREENING LOWER EXTREMITY)  Result  Date: 01/21/2021 CLINICAL DATA:  Hypertension Current smoker Left second toe cellulitis Left breast pain EXAM: NONINVASIVE PHYSIOLOGIC VASCULAR STUDY OF BILATERAL LOWER EXTREMITIES TECHNIQUE: Evaluation of both lower extremities were performed at rest, including calculation of ankle-brachial indices with single level Doppler, pressure and pulse volume recording. COMPARISON:  None. FINDINGS: Right ABI:  1.10 Left ABI:  1.09 Right Lower Extremity: Monophasic waveform seen in the right posterior tibial and dorsalis pedis arteries. Left Lower Extremity: Monophasic waveform seen in the posterior tibial and dorsalis pedis arteries. 1.0-1.4 Normal IMPRESSION: Although lower extremity ABI values are within normal limits, monophasic waveforms seen at the ankle bilaterally are suspicious for underlying arterial occlusive disease. Consider further evaluation with CT angiography of the abdominal aorta with runoff. Electronically Signed   By: Acquanetta Belling M.D.   On: 01/21/2021 11:43   DG Foot Complete Left  Result Date: 01/20/2021 CLINICAL DATA:  Second left toe wound EXAM: LEFT FOOT - COMPLETE 3+ VIEW COMPARISON:  None. FINDINGS: Soft tissue swelling of the distal second digit with erosion of the underlying middle and distal phalanx consistent with osteomyelitis. Erosive changes also noted in the distal tip of the distal phalanx of the great toe with overlying soft tissue abnormality suspicious for chronic osteomyelitis. Atherosclerotic changes seen throughout visualized arterial segments. IMPRESSION: Findings suspicious for osteomyelitis of the tip of the second and first toes. Electronically Signed   By: Acquanetta Belling M.D.   On: 01/20/2021 11:47   [place] EKG SITE RITE  Result Date: 01/21/2021 If Site Rite image not attached, placement could not be confirmed due to current cardiac rhythm.   Lab Data:  CBC: Recent Labs  Lab 02/04/21 0627  WBC 6.9  HGB 15.3  HCT 47.1  MCV 90.9  PLT 130*   Basic Metabolic  Panel: Recent Labs  Lab 02/04/21 0627  NA 138  K  3.7  CL 104  CO2 25  GLUCOSE 122*  BUN 12  CREATININE 0.80  CALCIUM 8.6*   GFR: Estimated Creatinine Clearance: 120.7 mL/min (by C-G formula based on SCr of 0.8 mg/dL). Liver Function Tests: Recent Labs  Lab 02/04/21 0627  AST 40  ALT 40  ALKPHOS 104  BILITOT 0.6  PROT 7.1  ALBUMIN 3.5   No results for input(s): LIPASE, AMYLASE in the last 168 hours. No results for input(s): AMMONIA in the last 168 hours. Coagulation Profile: No results for input(s): INR, PROTIME in the last 168 hours. Cardiac Enzymes: Recent Labs  Lab 02/04/21 0627  CKTOTAL 71   BNP (last 3 results) No results for input(s): PROBNP in the last 8760 hours. HbA1C: No results for input(s): HGBA1C in the last 72 hours. CBG: No results for input(s): GLUCAP in the last 168 hours. Lipid Profile: No results for input(s): CHOL, HDL, LDLCALC, TRIG, CHOLHDL, LDLDIRECT in the last 72 hours. Thyroid Function Tests: No results for input(s): TSH, T4TOTAL, FREET4, T3FREE, THYROIDAB in the last 72 hours. Anemia Panel: No results for input(s): VITAMINB12, FOLATE, FERRITIN, TIBC, IRON, RETICCTPCT in the last 72 hours. Urine analysis:    Component Value Date/Time   COLORURINE YELLOW 12/12/2017 [place] 12/12/2017 0931   LABSPEC 1.023 12/12/2017 0931   PHURINE 6.0 12/12/2017 0931   GLUCOSEU NEGATIVE 12/12/2017 0931   HGBUR NEGATIVE 12/12/2017 0931   BILIRUBINUR NEGATIVE 12/12/2017 0931   BILIRUBINUR Small 10/19/2015 1526   KETONESUR NEGATIVE 12/12/2017 0931   PROTEINUR NEGATIVE 12/12/2017 0931   UROBILINOGEN >=8.0 10/19/2015 1526   UROBILINOGEN 0.2 10/31/2014 1840   NITRITE NEGATIVE 12/12/2017 0931   LEUKOCYTESUR NEGATIVE 12/12/2017 0931     Effie Janoski M.D. [facility] 02/05/2021, 11:03 AM  Available via Epic secure chat 7am-7pm After 7 pm, please refer to night coverage provider listed on amion.

## 2021-02-05 NOTE — Plan of Care (Signed)

## 2021-02-06 DIAGNOSIS — F310 Bipolar disorder, current episode hypomanic: Secondary | ICD-10-CM | POA: Diagnosis not present

## 2021-02-06 DIAGNOSIS — L03032 Cellulitis of left toe: Secondary | ICD-10-CM | POA: Diagnosis not present

## 2021-02-06 DIAGNOSIS — M869 Osteomyelitis, unspecified: Secondary | ICD-10-CM | POA: Diagnosis not present

## 2021-02-06 NOTE — Progress Notes (Signed)
[facility]                                                                              Patient Demographics  [name] [name], is a 60 y.o. male, DOB - Mar 15, 1961, YHC:[id number]  Admit date - 01/20/2021   Admitting Physician Catarina Hartshorn, MD  Outpatient Primary MD for the patient is Dairl Ponder, NP  Outpatient specialists:   LOS - 16  days   Medical records reviewed and are as summarized below:    Chief Complaint  Patient presents with   Toe Pain       Brief summary   60 y.o. male with medical history of MDD with psychosis, seizure disorder, hypertension, tobacco abuse, cognitive impairment, atrial flutter with RVR, chronic hepatitis C, history of cocaine and alcohol abuse presenting due to abnormality with his left second toe.  The patient is a poor historian.  When asked why he is in the emergency department, he states " they tell me there is something wrong with my foot".  Apparently, the patient was getting his toenails trimmed at his assisted living facility [facility]) when it was noticed there was some abnormality and discoloration of his left second toe.  As result, the patient was sent to ED for further evaluation.  MRI showed active osteomyelitis in the middle and distal phalanges of the second toe with surrounding cellulitis.   Assessment & Plan     Acute osteomyelitis of toe (HCC), second toe of the left foot, cellulitis -ABI 1.1 on the left, 1.09 on the right. -Surgery was consulted, recommended conservative management -Placed on daptomycin and ceftriaxone with plan for 4 weeks of IV antibiotics, last dose on 02/17/2021 -PICC line placed on 11/3, -Labs on 11/17 CBC, CMET stable, CK 71, next labs on 11/24 -No acute complaints, awaiting SNF for completion of IV antibiotics   Bipolar disorder -Stable, no acute issues.  Continue Seroquel, sertraline, trazodone, hydroxyzine.    History of atrial flutter -Continue metoprolol, apixaban.    -Diltiazem on hold due to bradycardia   Seizure disorder -Continue lamotrigine, Keppra, Vimpat  Tobacco use -Counseled on smoking cessation  Chronic hepatitis C -No acute issues  Chronic thrombocytopenia -Likely due to hepatitis C, platelet count has been stable.  Code Status: Full code DVT Prophylaxis:   apixaban (ELIQUIS) tablet 5 mg   Level of Care: Level of care: Med-Surg Family Communication: Discussed all imaging results, lab results, explained to the patient   Disposition Plan:     Status is: Inpatient  Remains inpatient appropriate because: Awaiting skilled nursing facility otherwise medically clear.  No acute issues.    Time Spent in minutes 15 minutes  Procedures:  None  Consultants:   Surgery  Antimicrobials:   Anti-infectives (From admission, onward)    Start     Dose/Rate Route Frequency Ordered Stop   01/25/21 0000  cefTRIAXone 2 g in sodium chloride 0.9 % 100 mL        2 g Intravenous Every 24 hours 01/24/21 1616     01/24/21 0000  DAPTOmycin 700 mg in sodium chloride 0.9 % 50 mL  700 mg Intravenous Daily 01/24/21 1616     01/22/21 1600  DAPTOmycin (CUBICIN) 700 mg in sodium chloride 0.9 % IVPB        700 mg 128 mL/hr over 30 Minutes Intravenous Daily 01/22/21 1248     01/21/21 1330  cefTRIAXone (ROCEPHIN) 2 g in sodium chloride 0.9 % 100 mL IVPB        2 g 200 mL/hr over 30 Minutes Intravenous Every 24 hours 01/21/21 1238     01/21/21 0030  vancomycin (VANCOREADY) IVPB 1500 mg/300 mL  Status:  Discontinued       See Hyperspace for full Linked Orders Report.   1,500 mg 150 mL/hr over 120 Minutes Intravenous Every 12 hours 01/20/21 1226 01/22/21 1248   01/20/21 1545  ceFEPIme (MAXIPIME) 2 g in sodium chloride 0.9 % 100 mL IVPB  Status:  Discontinued        2 g 200 mL/hr over 30 Minutes Intravenous Every 8 hours 01/20/21 1540 01/21/21 1238   01/20/21 1230  vancomycin (VANCOREADY) IVPB 2000 mg/400 mL       See Hyperspace for full Linked  Orders Report.   2,000 mg 200 mL/hr over 120 Minutes Intravenous  Once 01/20/21 1226 01/20/21 1450          Medications  Scheduled Meds:  apixaban  5 mg Oral BID   Chlorhexidine Gluconate Cloth  6 each Topical Daily   hydrOXYzine  25 mg Oral BID   lacosamide  150 mg Oral BID   lamoTRIgine  25 mg Oral Daily   levETIRAcetam  1,500 mg Oral BID   metoprolol succinate  25 mg Oral Daily   nicotine  14 mg Transdermal Daily   QUEtiapine  50 mg Oral BID   And   QUEtiapine  200 mg Oral QHS   sodium chloride flush  10-40 mL Intracatheter Q12H   Continuous Infusions:  cefTRIAXone (ROCEPHIN)  IV 2 g (02/06/21 1143)   DAPTOmycin (CUBICIN)  IV 700 mg (02/05/21 2108)   PRN Meds:.acetaminophen **OR** acetaminophen, haloperidol lactate, sodium chloride flush      Subjective:   [name] [name] was seen and examined today.  Doing well, no acute complaints, no acute overnight issues.  Tired of being in the hospital, wants to watch a movie on TV.  Awaiting SNF  Objective:   Vitals:   02/05/21 0830 02/05/21 2058 02/06/21 0514 02/06/21 1400  BP: 108/83 (!) 139/96 114/70 138/81  Pulse: 66 73 60 64  Resp:  20 20 20   Temp:  97.8 F (36.6 C) 98.6 F (37 C) 97.6 F (36.4 C)  TempSrc:  Oral  Oral  SpO2:  98% 97% 100%  Weight:      Height:        Intake/Output Summary (Last 24 hours) at 02/06/2021 1407 Last data filed at 02/06/2021 0900 Gross per 24 hour  Intake 720 ml  Output 900 ml  Net -180 ml     Wt Readings from Last 3 Encounters:  01/20/21 106.5 kg  05/31/18 91.8 kg  01/24/18 86.2 kg   Physical Exam General: Alert and oriented x 3, NAD Cardiovascular: S1 S2 clear, RRR.  Respiratory: CTAB, no wheezing, rales or rhonchi Gastrointestinal: Soft, nontender, nondistended, NBS Ext: no pedal edema bilaterally, wound left second toe    Data Reviewed:  I have personally reviewed following labs and imaging studies  Micro Results No results found for this or any  previous visit (from the past 240 hour(s)).  Radiology Reports MR FOOT LEFT  W WO CONTRAST  Result Date: 01/21/2021 CLINICAL DATA:  Cellulitis of the left second toe EXAM: MRI OF THE LEFT FOREFOOT WITHOUT AND WITH CONTRAST TECHNIQUE: Multiplanar, multisequence MR imaging of the left forefoot was performed both before and after administration of intravenous contrast. CONTRAST:  30mL GADAVIST GADOBUTROL 1 MMOL/ML IV SOLN COMPARISON:  01/20/2021 FINDINGS: Bones/Joint/Cartilage Diffuse edema signal and enhancement in the middle and distal phalanges of the second toe compatible with active osteomyelitis. There is deformity of the distal phalanx of the great toe with scalloping of the tuft but with little in the way of edema. This may be the result of chronic deformity or remote osteomyelitis but we do not demonstrate striking edema or enhancement in the remaining distal phalanx a thing this less likely to be from acute osteomyelitis. There is some thinning of the overlying soft tissues of the distal toe. Correlate patient history. 5 mm degenerative subcortical cystic lesion or geode in the proximal navicular. Ligaments Lisfranc ligament intact Muscles and Tendons Regional muscular atrophy Soft tissues Subcutaneous edema and enhancement in the second toe compatible with cellulitis. Dorsal subcutaneous edema in the forefoot is not a complete by enhancement and accordingly is not necessarily infectious. IMPRESSION: 1. Active osteomyelitis in the middle and distal phalanges of the second toe with surrounding cellulitis. 2. Deformity of the distal phalanx of the great toe but without abnormal edema or enhancement either in the distal phalanx or in the surrounding soft tissues. Accordingly this is more likely to represent deformity from an old/chronic process rather than acute osteomyelitis. Electronically Signed   By: Van Clines M.D.   On: 01/21/2021 06:45   US ARTERIAL ABI (SCREENING LOWER EXTREMITY)  Result  Date: 01/21/2021 CLINICAL DATA:  Hypertension Current smoker Left second toe cellulitis Left breast pain EXAM: NONINVASIVE PHYSIOLOGIC VASCULAR STUDY OF BILATERAL LOWER EXTREMITIES TECHNIQUE: Evaluation of both lower extremities were performed at rest, including calculation of ankle-brachial indices with single level Doppler, pressure and pulse volume recording. COMPARISON:  None. FINDINGS: Right ABI:  1.10 Left ABI:  1.09 Right Lower Extremity: Monophasic waveform seen in the right posterior tibial and dorsalis pedis arteries. Left Lower Extremity: Monophasic waveform seen in the posterior tibial and dorsalis pedis arteries. 1.0-1.4 Normal IMPRESSION: Although lower extremity ABI values are within normal limits, monophasic waveforms seen at the ankle bilaterally are suspicious for underlying arterial occlusive disease. Consider further evaluation with CT angiography of the abdominal aorta with runoff. Electronically Signed   By: Miachel Roux M.D.   On: 01/21/2021 11:43   DG Foot Complete Left  Result Date: 01/20/2021 CLINICAL DATA:  Second left toe wound EXAM: LEFT FOOT - COMPLETE 3+ VIEW COMPARISON:  None. FINDINGS: Soft tissue swelling of the distal second digit with erosion of the underlying middle and distal phalanx consistent with osteomyelitis. Erosive changes also noted in the distal tip of the distal phalanx of the great toe with overlying soft tissue abnormality suspicious for chronic osteomyelitis. Atherosclerotic changes seen throughout visualized arterial segments. IMPRESSION: Findings suspicious for osteomyelitis of the tip of the second and first toes. Electronically Signed   By: Miachel Roux M.D.   On: 01/20/2021 11:47   [place] EKG SITE RITE  Result Date: 01/21/2021 If Site Rite image not attached, placement could not be confirmed due to current cardiac rhythm.   Lab Data:  CBC: Recent Labs  Lab 02/04/21 0627  WBC 6.9  HGB 15.3  HCT 47.1  MCV 90.9  PLT [id number]   Basic Metabolic  Panel: Recent  Labs  Lab 02/04/21 0627  NA 138  K 3.7  CL 104  CO2 25  GLUCOSE 122*  BUN 12  CREATININE 0.80  CALCIUM 8.6*   GFR: Estimated Creatinine Clearance: 120.7 mL/min (by C-G formula based on SCr of 0.8 mg/dL). Liver Function Tests: Recent Labs  Lab 02/04/21 0627  AST 40  ALT 40  ALKPHOS 104  BILITOT 0.6  PROT 7.1  ALBUMIN 3.5   No results for input(s): LIPASE, AMYLASE in the last 168 hours. No results for input(s): AMMONIA in the last 168 hours. Coagulation Profile: No results for input(s): INR, PROTIME in the last 168 hours. Cardiac Enzymes: Recent Labs  Lab 02/04/21 0627  CKTOTAL 71   BNP (last 3 results) No results for input(s): PROBNP in the last 8760 hours. HbA1C: No results for input(s): HGBA1C in the last 72 hours. CBG: No results for input(s): GLUCAP in the last 168 hours. Lipid Profile: No results for input(s): CHOL, HDL, LDLCALC, TRIG, CHOLHDL, LDLDIRECT in the last 72 hours. Thyroid Function Tests: No results for input(s): TSH, T4TOTAL, FREET4, T3FREE, THYROIDAB in the last 72 hours. Anemia Panel: No results for input(s): VITAMINB12, FOLATE, FERRITIN, TIBC, IRON, RETICCTPCT in the last 72 hours. Urine analysis:    Component Value Date/Time   COLORURINE YELLOW 12/12/2017 [place] 12/12/2017 0931   LABSPEC 1.023 12/12/2017 0931   PHURINE 6.0 12/12/2017 0931   GLUCOSEU NEGATIVE 12/12/2017 0931   HGBUR NEGATIVE 12/12/2017 0931   BILIRUBINUR NEGATIVE 12/12/2017 0931   BILIRUBINUR Small 10/19/2015 1526   KETONESUR NEGATIVE 12/12/2017 0931   PROTEINUR NEGATIVE 12/12/2017 0931   UROBILINOGEN >=8.0 10/19/2015 1526   UROBILINOGEN 0.2 10/31/2014 1840   NITRITE NEGATIVE 12/12/2017 0931   LEUKOCYTESUR NEGATIVE 12/12/2017 0931     Sarafina Puthoff M.D. [facility] 02/06/2021, 2:07 PM  Available via Epic secure chat 7am-7pm After 7 pm, please refer to night coverage provider listed on amion.

## 2021-02-07 DIAGNOSIS — F310 Bipolar disorder, current episode hypomanic: Secondary | ICD-10-CM | POA: Diagnosis not present

## 2021-02-07 DIAGNOSIS — M86172 Other acute osteomyelitis, left ankle and foot: Secondary | ICD-10-CM | POA: Diagnosis not present

## 2021-02-07 DIAGNOSIS — L03032 Cellulitis of left toe: Secondary | ICD-10-CM | POA: Diagnosis not present

## 2021-02-07 DIAGNOSIS — M869 Osteomyelitis, unspecified: Secondary | ICD-10-CM | POA: Diagnosis not present

## 2021-02-07 NOTE — Progress Notes (Signed)
[facility]                                                                              Patient Demographics  [name] [name], is a 60 y.o. male, DOB - 05-Jan-1961, [id number]  Admit date - 01/20/2021   Admitting Physician [name] Eva, MD  Outpatient Primary MD for the patient is [name] Mc, NP  Outpatient specialists:   LOS - 17  days   Medical records reviewed and are as summarized below:    Chief Complaint  Patient presents with   Toe Pain       Brief summary   60 y.o. male with medical history of MDD with psychosis, seizure disorder, hypertension, tobacco abuse, cognitive impairment, atrial flutter with RVR, chronic hepatitis C, history of cocaine and alcohol abuse presenting due to abnormality with his left second toe.  The patient is a poor historian.  When asked why he is in the emergency department, he states " they tell me there is something wrong with my foot".  Apparently, the patient was getting his toenails trimmed at his assisted living facility [name]) when it was noticed there was some abnormality and discoloration of his left second toe.  As result, the patient was sent to ED for further evaluation.  MRI showed active osteomyelitis in the middle and distal phalanges of the second toe with surrounding cellulitis.   Assessment & Plan     Acute osteomyelitis of toe ([place]), second toe of the left foot, cellulitis -ABI 1.1 on the left, 1.09 on the right. -Surgery was consulted, recommended conservative management -Placed on daptomycin and ceftriaxone with plan for 4 weeks of IV antibiotics, last dose on 02/17/2021 -PICC line placed on 11/3, -Labs on 11/17 CBC, CMET stable, CK 71, next labs on 11/24 -Awaiting skilled nursing facility   Bipolar disorder -Stable, no acute issues.  Continue Seroquel, sertraline, trazodone, hydroxyzine.    History of atrial flutter -Continue metoprolol, apixaban.   -Diltiazem on hold due to bradycardia    Seizure disorder -Continue lamotrigine, Keppra, Vimpat  Tobacco use -Counseled on smoking cessation  Chronic hepatitis C -No acute issues  Chronic thrombocytopenia -Likely due to hepatitis C, platelet count has been stable.  Code Status: Full code DVT Prophylaxis:   apixaban (ELIQUIS) tablet 5 mg   Level of Care: Level of care: Med-Surg Family Communication: Discussed all imaging results, lab results, explained to the patient   Disposition Plan:     Status is: Inpatient  Remains inpatient appropriate because: Awaiting SNF, medically clear, no acute issues.  Difficult disposition.  His group home is unable to take him with IV antibiotics   Time Spent in minutes 15 minutes  Procedures:  None  Consultants:   Surgery  Antimicrobials:   Anti-infectives (From admission, onward)    Start     Dose/Rate Route Frequency Ordered Stop   01/25/21 0000  cefTRIAXone 2 g in sodium chloride 0.9 % 100 mL        2 g Intravenous Every 24 hours 01/24/21 1616     01/24/21 0000  DAPTOmycin 700 mg in sodium chloride 0.9 % 50 mL  700 mg Intravenous Daily 01/24/21 1616     01/22/21 1600  DAPTOmycin (CUBICIN) 700 mg in sodium chloride 0.9 % IVPB        700 mg 128 mL/hr over 30 Minutes Intravenous Daily 01/22/21 1248     01/21/21 1330  cefTRIAXone (ROCEPHIN) 2 g in sodium chloride 0.9 % 100 mL IVPB        2 g 200 mL/hr over 30 Minutes Intravenous Every 24 hours 01/21/21 1238     01/21/21 0030  vancomycin (VANCOREADY) IVPB 1500 mg/300 mL  Status:  Discontinued       See Hyperspace for full Linked Orders Report.   1,500 mg 150 mL/hr over 120 Minutes Intravenous Every 12 hours 01/20/21 1226 01/22/21 1248   01/20/21 1545  ceFEPIme (MAXIPIME) 2 g in sodium chloride 0.9 % 100 mL IVPB  Status:  Discontinued        2 g 200 mL/hr over 30 Minutes Intravenous Every 8 hours 01/20/21 1540 01/21/21 1238   01/20/21 1230  vancomycin (VANCOREADY) IVPB 2000 mg/400 mL       See Hyperspace for full  Linked Orders Report.   2,000 mg 200 mL/hr over 120 Minutes Intravenous  Once 01/20/21 1226 01/20/21 1450          Medications  Scheduled Meds:  apixaban  5 mg Oral BID   Chlorhexidine Gluconate Cloth  6 each Topical Daily   hydrOXYzine  25 mg Oral BID   lacosamide  150 mg Oral BID   lamoTRIgine  25 mg Oral Daily   levETIRAcetam  1,500 mg Oral BID   metoprolol succinate  25 mg Oral Daily   nicotine  14 mg Transdermal Daily   QUEtiapine  50 mg Oral BID   And   QUEtiapine  200 mg Oral QHS   sodium chloride flush  10-40 mL Intracatheter Q12H   Continuous Infusions:  cefTRIAXone (ROCEPHIN)  IV 2 g (02/06/21 1143)   DAPTOmycin (CUBICIN)  IV 128 mL/hr at 02/07/21 0641   PRN Meds:.acetaminophen **OR** acetaminophen, haloperidol lactate, sodium chloride flush      Subjective:   [name] [name] was seen and examined today.  Eating breakfast and watching TV, no acute complaints at this time.  No fevers, overnight no acute issues.  Objective:   Vitals:   02/06/21 0514 02/06/21 1400 02/06/21 1937 02/07/21 0528  BP: 114/70 138/81 (!) 141/99 120/75  Pulse: 60 64 76 72  Resp: 20 20 20 19   Temp: 98.6 F (37 C) 97.6 F (36.4 C) 98 F (36.7 C) 98.4 F (36.9 C)  TempSrc:  Oral Oral   SpO2: 97% 100% 96% 91%  Weight:      Height:        Intake/Output Summary (Last 24 hours) at 02/07/2021 1308 Last data filed at 02/07/2021 0900 Gross per 24 hour  Intake 814 ml  Output 1000 ml  Net -186 ml     Wt Readings from Last 3 Encounters:  01/20/21 106.5 kg  05/31/18 91.8 kg  01/24/18 86.2 kg   Physical Exam General: Alert and oriented x 3, NAD Cardiovascular: S1 S2 clear, RRR. No pedal edema b/l Respiratory: CTAB, no wheezing, rales or rhonchi Gastrointestinal: Soft, nontender, nondistended, NBS Ext: no pedal edema bilaterally, PICC line right upper arm, wound left second toe Neuro: no new deficits   Data Reviewed:  I have personally reviewed following labs and  imaging studies  Micro Results No results found for this or any previous visit (from the past 240  hour(s)).  Radiology Reports MR FOOT LEFT W WO CONTRAST  Result Date: 01/21/2021 CLINICAL DATA:  Cellulitis of the left second toe EXAM: MRI OF THE LEFT FOREFOOT WITHOUT AND WITH CONTRAST TECHNIQUE: Multiplanar, multisequence MR imaging of the left forefoot was performed both before and after administration of intravenous contrast. CONTRAST:  41mL GADAVIST GADOBUTROL 1 MMOL/ML IV SOLN COMPARISON:  01/20/2021 FINDINGS: Bones/Joint/Cartilage Diffuse edema signal and enhancement in the middle and distal phalanges of the second toe compatible with active osteomyelitis. There is deformity of the distal phalanx of the great toe with scalloping of the tuft but with little in the way of edema. This may be the result of chronic deformity or remote osteomyelitis but we do not demonstrate striking edema or enhancement in the remaining distal phalanx a thing this less likely to be from acute osteomyelitis. There is some thinning of the overlying soft tissues of the distal toe. Correlate patient history. 5 mm degenerative subcortical cystic lesion or geode in the proximal navicular. Ligaments Lisfranc ligament intact Muscles and Tendons Regional muscular atrophy Soft tissues Subcutaneous edema and enhancement in the second toe compatible with cellulitis. Dorsal subcutaneous edema in the forefoot is not a complete by enhancement and accordingly is not necessarily infectious. IMPRESSION: 1. Active osteomyelitis in the middle and distal phalanges of the second toe with surrounding cellulitis. 2. Deformity of the distal phalanx of the great toe but without abnormal edema or enhancement either in the distal phalanx or in the surrounding soft tissues. Accordingly this is more likely to represent deformity from an old/chronic process rather than acute osteomyelitis. Electronically Signed   By: Gaylyn Rong M.D.   On:  01/21/2021 06:45   US ARTERIAL ABI (SCREENING LOWER EXTREMITY)  Result Date: 01/21/2021 CLINICAL DATA:  Hypertension Current smoker Left second toe cellulitis Left breast pain EXAM: NONINVASIVE PHYSIOLOGIC VASCULAR STUDY OF BILATERAL LOWER EXTREMITIES TECHNIQUE: Evaluation of both lower extremities were performed at rest, including calculation of ankle-brachial indices with single level Doppler, pressure and pulse volume recording. COMPARISON:  None. FINDINGS: Right ABI:  1.10 Left ABI:  1.09 Right Lower Extremity: Monophasic waveform seen in the right posterior tibial and dorsalis pedis arteries. Left Lower Extremity: Monophasic waveform seen in the posterior tibial and dorsalis pedis arteries. 1.0-1.4 Normal IMPRESSION: Although lower extremity ABI values are within normal limits, monophasic waveforms seen at the ankle bilaterally are suspicious for underlying arterial occlusive disease. Consider further evaluation with CT angiography of the abdominal aorta with runoff. Electronically Signed   By: Acquanetta Belling M.D.   On: 01/21/2021 11:43   DG Foot Complete Left  Result Date: 01/20/2021 CLINICAL DATA:  Second left toe wound EXAM: LEFT FOOT - COMPLETE 3+ VIEW COMPARISON:  None. FINDINGS: Soft tissue swelling of the distal second digit with erosion of the underlying middle and distal phalanx consistent with osteomyelitis. Erosive changes also noted in the distal tip of the distal phalanx of the great toe with overlying soft tissue abnormality suspicious for chronic osteomyelitis. Atherosclerotic changes seen throughout visualized arterial segments. IMPRESSION: Findings suspicious for osteomyelitis of the tip of the second and first toes. Electronically Signed   By: Acquanetta Belling M.D.   On: 01/20/2021 11:47   [place] EKG SITE RITE  Result Date: 01/21/2021 If Site Rite image not attached, placement could not be confirmed due to current cardiac rhythm.   Lab Data:  CBC: Recent Labs  Lab 02/04/21 0627  WBC  6.9  HGB 15.3  HCT 47.1  MCV 90.9  PLT  [id number]*   Basic Metabolic Panel: Recent Labs  Lab 02/04/21 0627  NA 138  K 3.7  CL 104  CO2 25  GLUCOSE 122*  BUN 12  CREATININE 0.80  CALCIUM 8.6*   GFR: Estimated Creatinine Clearance: 120.7 mL/min (by C-G formula based on SCr of 0.8 mg/dL). Liver Function Tests: Recent Labs  Lab 02/04/21 0627  AST 40  ALT 40  ALKPHOS 104  BILITOT 0.6  PROT 7.1  ALBUMIN 3.5   No results for input(s): LIPASE, AMYLASE in the last 168 hours. No results for input(s): AMMONIA in the last 168 hours. Coagulation Profile: No results for input(s): INR, PROTIME in the last 168 hours. Cardiac Enzymes: Recent Labs  Lab 02/04/21 0627  CKTOTAL 71   BNP (last 3 results) No results for input(s): PROBNP in the last 8760 hours. HbA1C: No results for input(s): HGBA1C in the last 72 hours. CBG: No results for input(s): GLUCAP in the last 168 hours. Lipid Profile: No results for input(s): CHOL, HDL, LDLCALC, TRIG, CHOLHDL, LDLDIRECT in the last 72 hours. Thyroid Function Tests: No results for input(s): TSH, T4TOTAL, FREET4, T3FREE, THYROIDAB in the last 72 hours. Anemia Panel: No results for input(s): VITAMINB12, FOLATE, FERRITIN, TIBC, IRON, RETICCTPCT in the last 72 hours. Urine analysis:    Component Value Date/Time   COLORURINE YELLOW 12/12/2017 [place] 12/12/2017 0931   LABSPEC 1.023 12/12/2017 0931   PHURINE 6.0 12/12/2017 0931   GLUCOSEU NEGATIVE 12/12/2017 0931   HGBUR NEGATIVE 12/12/2017 0931   BILIRUBINUR NEGATIVE 12/12/2017 0931   BILIRUBINUR Small 10/19/2015 1526   KETONESUR NEGATIVE 12/12/2017 0931   PROTEINUR NEGATIVE 12/12/2017 0931   UROBILINOGEN >=8.0 10/19/2015 1526   UROBILINOGEN 0.2 10/31/2014 1840   NITRITE NEGATIVE 12/12/2017 0931   LEUKOCYTESUR NEGATIVE 12/12/2017 0931     Zella Dewan M.D. [facility] 02/07/2021, 1:08 PM  Available via Epic secure chat 7am-7pm After 7 pm, please refer to  night coverage provider listed on amion.

## 2021-02-08 DIAGNOSIS — F310 Bipolar disorder, current episode hypomanic: Secondary | ICD-10-CM | POA: Diagnosis not present

## 2021-02-08 DIAGNOSIS — M86172 Other acute osteomyelitis, left ankle and foot: Secondary | ICD-10-CM | POA: Diagnosis not present

## 2021-02-08 DIAGNOSIS — L03032 Cellulitis of left toe: Secondary | ICD-10-CM | POA: Diagnosis not present

## 2021-02-08 DIAGNOSIS — M869 Osteomyelitis, unspecified: Secondary | ICD-10-CM | POA: Diagnosis not present

## 2021-02-08 MED ORDER — MELATONIN 3 MG PO TABS
6.0000 mg | ORAL_TABLET | Freq: Every day | ORAL | Status: DC
  Administered 2021-02-08 – 2021-03-03 (×24): 6 mg via ORAL
  Filled 2021-02-08 (×24): qty 2

## 2021-02-08 NOTE — Progress Notes (Signed)
[facility]                                                                              Patient Demographics  [name] [name], is a 60 y.o. male, DOB - 31-Oct-1960, [id number]  Admit date - 01/20/2021   Admitting Physician Orson Eva, MD  Outpatient Primary MD for the patient is [name], NP  Outpatient specialists:   LOS - 18  days   Medical records reviewed and are as summarized below:    Chief Complaint  Patient presents with   Toe Pain       Brief summary   60 y.o. male with medical history of MDD with psychosis, seizure disorder, hypertension, tobacco abuse, cognitive impairment, atrial flutter with RVR, chronic hepatitis C, history of cocaine and alcohol abuse presenting due to abnormality with his left second toe.  The patient is a poor historian.  When asked why he is in the emergency department, he states " they tell me there is something wrong with my foot".  Apparently, the patient was getting his toenails trimmed at his assisted living facility [facility]) when it was noticed there was some abnormality and discoloration of his left second toe.  As result, the patient was sent to ED for further evaluation.  MRI showed active osteomyelitis in the middle and distal phalanges of the second toe with surrounding cellulitis.   Assessment & Plan     Acute osteomyelitis of toe ([place]), second toe of the left foot, cellulitis -ABI 1.1 on the left, 1.09 on the right. -Surgery was consulted, recommended conservative management -Placed on daptomycin and ceftriaxone with plan for 4 weeks of IV antibiotics, last dose on 02/17/2021 -PICC line placed on 11/3, -Labs on 11/17 CBC, CMET stable, CK 71, next labs on 11/24 -No complaints except not sleeping well  Bipolar disorder -Stable, no acute issues.  Continue Seroquel, sertraline, trazodone, hydroxyzine.    History of atrial flutter -Continue metoprolol, apixaban.   -Diltiazem on hold due to  bradycardia   Seizure disorder -Continue lamotrigine, Keppra, Vimpat  Tobacco use -Counseled on smoking cessation  Chronic hepatitis C -No acute issues  Chronic thrombocytopenia -Likely due to hepatitis C, platelet count has been stable.  Code Status: Full code DVT Prophylaxis:   apixaban (ELIQUIS) tablet 5 mg   Level of Care: Level of care: Med-Surg Family Communication: Discussed all imaging results, lab results, explained to the patient   Disposition Plan:     Status is: Inpatient  Remains inpatient appropriate because: Awaiting skilled nursing facility, medically cleared.  No acute issues.   Time Spent in minutes 15 minutes  Procedures:  None  Consultants:   Surgery  Antimicrobials:   Anti-infectives (From admission, onward)    Start     Dose/Rate Route Frequency Ordered Stop   01/25/21 0000  cefTRIAXone 2 g in sodium chloride 0.9 % 100 mL        2 g Intravenous Every 24 hours 01/24/21 1616     01/24/21 0000  DAPTOmycin 700 mg in sodium chloride 0.9 % 50 mL        700 mg Intravenous Daily 01/24/21 1616  01/22/21 1600  DAPTOmycin (CUBICIN) 700 mg in sodium chloride 0.9 % IVPB        700 mg 128 mL/hr over 30 Minutes Intravenous Daily 01/22/21 1248     01/21/21 1330  cefTRIAXone (ROCEPHIN) 2 g in sodium chloride 0.9 % 100 mL IVPB        2 g 200 mL/hr over 30 Minutes Intravenous Every 24 hours 01/21/21 1238     01/21/21 0030  vancomycin (VANCOREADY) IVPB 1500 mg/300 mL  Status:  Discontinued       See Hyperspace for full Linked Orders Report.   1,500 mg 150 mL/hr over 120 Minutes Intravenous Every 12 hours 01/20/21 1226 01/22/21 1248   01/20/21 1545  ceFEPIme (MAXIPIME) 2 g in sodium chloride 0.9 % 100 mL IVPB  Status:  Discontinued        2 g 200 mL/hr over 30 Minutes Intravenous Every 8 hours 01/20/21 1540 01/21/21 1238   01/20/21 1230  vancomycin (VANCOREADY) IVPB 2000 mg/400 mL       See Hyperspace for full Linked Orders Report.   2,000 mg 200 mL/hr  over 120 Minutes Intravenous  Once 01/20/21 1226 01/20/21 1450          Medications  Scheduled Meds:  apixaban  5 mg Oral BID   Chlorhexidine Gluconate Cloth  6 each Topical Daily   hydrOXYzine  25 mg Oral BID   lacosamide  150 mg Oral BID   lamoTRIgine  25 mg Oral Daily   levETIRAcetam  1,500 mg Oral BID   melatonin  6 mg Oral QHS   metoprolol succinate  25 mg Oral Daily   nicotine  14 mg Transdermal Daily   QUEtiapine  50 mg Oral BID   And   QUEtiapine  200 mg Oral QHS   sodium chloride flush  10-40 mL Intracatheter Q12H   Continuous Infusions:  cefTRIAXone (ROCEPHIN)  IV 2 g (02/07/21 1403)   DAPTOmycin (CUBICIN)  IV 700 mg (02/07/21 2004)   PRN Meds:.acetaminophen **OR** acetaminophen, haloperidol lactate, sodium chloride flush      Subjective:   [name] [name] was seen and examined today.  No acute complaints except did not sleep well last night.  Awaiting skilled nursing facility  Objective:   Vitals:   02/07/21 0528 02/07/21 1550 02/07/21 2000 02/08/21 0340  BP: 120/75 136/70 125/84 (!) 147/91  Pulse: 72 (!) 58 83 69  Resp: 19 20 18 16   Temp: 98.4 F (36.9 C) 97.8 F (36.6 C) 98 F (36.7 C) 98.4 F (36.9 C)  TempSrc:  Oral Oral Oral  SpO2: 91% 98% 96% 99%  Weight:      Height:        Intake/Output Summary (Last 24 hours) at 02/08/2021 1400 Last data filed at 02/08/2021 1100 Gross per 24 hour  Intake 1600 ml  Output 1500 ml  Net 100 ml     Wt Readings from Last 3 Encounters:  01/20/21 106.5 kg  05/31/18 91.8 kg  01/24/18 86.2 kg   Physical Exam General: Alert and oriented x 3, NAD Cardiovascular: S1 S2 clear, RRR.  Respiratory: CTAB Gastrointestinal: Soft, nontender, nondistended, NBS Ext: no pedal edema bilaterally Skin: Wound to left second toe   Data Reviewed:  I have personally reviewed following labs and imaging studies  Micro Results No results found for this or any previous visit (from the past 240  hour(s)).  Radiology Reports MR FOOT LEFT W WO CONTRAST  Result Date: 01/21/2021 CLINICAL DATA:  Cellulitis of the  left second toe EXAM: MRI OF THE LEFT FOREFOOT WITHOUT AND WITH CONTRAST TECHNIQUE: Multiplanar, multisequence MR imaging of the left forefoot was performed both before and after administration of intravenous contrast. CONTRAST:  48mL GADAVIST GADOBUTROL 1 MMOL/ML IV SOLN COMPARISON:  01/20/2021 FINDINGS: Bones/Joint/Cartilage Diffuse edema signal and enhancement in the middle and distal phalanges of the second toe compatible with active osteomyelitis. There is deformity of the distal phalanx of the great toe with scalloping of the tuft but with little in the way of edema. This may be the result of chronic deformity or remote osteomyelitis but we do not demonstrate striking edema or enhancement in the remaining distal phalanx a thing this less likely to be from acute osteomyelitis. There is some thinning of the overlying soft tissues of the distal toe. Correlate patient history. 5 mm degenerative subcortical cystic lesion or geode in the proximal navicular. Ligaments Lisfranc ligament intact Muscles and Tendons Regional muscular atrophy Soft tissues Subcutaneous edema and enhancement in the second toe compatible with cellulitis. Dorsal subcutaneous edema in the forefoot is not a complete by enhancement and accordingly is not necessarily infectious. IMPRESSION: 1. Active osteomyelitis in the middle and distal phalanges of the second toe with surrounding cellulitis. 2. Deformity of the distal phalanx of the great toe but without abnormal edema or enhancement either in the distal phalanx or in the surrounding soft tissues. Accordingly this is more likely to represent deformity from an old/chronic process rather than acute osteomyelitis. Electronically Signed   By: Van Clines M.D.   On: 01/21/2021 06:45   US ARTERIAL ABI (SCREENING LOWER EXTREMITY)  Result Date: 01/21/2021 CLINICAL DATA:   Hypertension Current smoker Left second toe cellulitis Left breast pain EXAM: NONINVASIVE PHYSIOLOGIC VASCULAR STUDY OF BILATERAL LOWER EXTREMITIES TECHNIQUE: Evaluation of both lower extremities were performed at rest, including calculation of ankle-brachial indices with single level Doppler, pressure and pulse volume recording. COMPARISON:  None. FINDINGS: Right ABI:  1.10 Left ABI:  1.09 Right Lower Extremity: Monophasic waveform seen in the right posterior tibial and dorsalis pedis arteries. Left Lower Extremity: Monophasic waveform seen in the posterior tibial and dorsalis pedis arteries. 1.0-1.4 Normal IMPRESSION: Although lower extremity ABI values are within normal limits, monophasic waveforms seen at the ankle bilaterally are suspicious for underlying arterial occlusive disease. Consider further evaluation with CT angiography of the abdominal aorta with runoff. Electronically Signed   By: Miachel Roux M.D.   On: 01/21/2021 11:43   DG Foot Complete Left  Result Date: 01/20/2021 CLINICAL DATA:  Second left toe wound EXAM: LEFT FOOT - COMPLETE 3+ VIEW COMPARISON:  None. FINDINGS: Soft tissue swelling of the distal second digit with erosion of the underlying middle and distal phalanx consistent with osteomyelitis. Erosive changes also noted in the distal tip of the distal phalanx of the great toe with overlying soft tissue abnormality suspicious for chronic osteomyelitis. Atherosclerotic changes seen throughout visualized arterial segments. IMPRESSION: Findings suspicious for osteomyelitis of the tip of the second and first toes. Electronically Signed   By: Miachel Roux M.D.   On: 01/20/2021 11:47   [place] EKG SITE RITE  Result Date: 01/21/2021 If Site Rite image not attached, placement could not be confirmed due to current cardiac rhythm.   Lab Data:  CBC: Recent Labs  Lab 02/04/21 0627  WBC 6.9  HGB 15.3  HCT 47.1  MCV 90.9  PLT [id number]   Basic Metabolic Panel: Recent Labs  Lab 02/04/21 0627   NA 138  K 3.7  CL  104  CO2 25  GLUCOSE 122*  BUN 12  CREATININE 0.80  CALCIUM 8.6*   GFR: Estimated Creatinine Clearance: 120.7 mL/min (by C-G formula based on SCr of 0.8 mg/dL). Liver Function Tests: Recent Labs  Lab 02/04/21 0627  AST 40  ALT 40  ALKPHOS 104  BILITOT 0.6  PROT 7.1  ALBUMIN 3.5   No results for input(s): LIPASE, AMYLASE in the last 168 hours. No results for input(s): AMMONIA in the last 168 hours. Coagulation Profile: No results for input(s): INR, PROTIME in the last 168 hours. Cardiac Enzymes: Recent Labs  Lab 02/04/21 0627  CKTOTAL 71   BNP (last 3 results) No results for input(s): PROBNP in the last 8760 hours. HbA1C: No results for input(s): HGBA1C in the last 72 hours. CBG: No results for input(s): GLUCAP in the last 168 hours. Lipid Profile: No results for input(s): CHOL, HDL, LDLCALC, TRIG, CHOLHDL, LDLDIRECT in the last 72 hours. Thyroid Function Tests: No results for input(s): TSH, T4TOTAL, FREET4, T3FREE, THYROIDAB in the last 72 hours. Anemia Panel: No results for input(s): VITAMINB12, FOLATE, FERRITIN, TIBC, IRON, RETICCTPCT in the last 72 hours. Urine analysis:    Component Value Date/Time   COLORURINE YELLOW 12/12/2017 [place] 12/12/2017 0931   LABSPEC 1.023 12/12/2017 0931   PHURINE 6.0 12/12/2017 0931   GLUCOSEU NEGATIVE 12/12/2017 0931   HGBUR NEGATIVE 12/12/2017 0931   BILIRUBINUR NEGATIVE 12/12/2017 0931   BILIRUBINUR [place] 10/19/2015 1526   KETONESUR NEGATIVE 12/12/2017 0931   PROTEINUR NEGATIVE 12/12/2017 0931   UROBILINOGEN >=8.0 10/19/2015 1526   UROBILINOGEN 0.2 10/31/2014 1840   NITRITE NEGATIVE 12/12/2017 0931   LEUKOCYTESUR NEGATIVE 12/12/2017 0931     Loren Vicens M.D. [facility] 02/08/2021, 2:00 PM  Available via Epic secure chat 7am-7pm After 7 pm, please refer to night coverage provider listed on amion.

## 2021-02-08 NOTE — TOC Progression Note (Signed)
Transition of Care [facility]) - Progression Note    Patient Details  Name: [name] [name] MRN: [id number] Date of Birth: 1960/09/02  [facility] CM/SW Contact  Barry Brunner, LCSW Phone Number: 02/08/2021, 1:01 PM  Clinical Narrative:    Eunice Blase reported that she is still working on patient's application. TOC to follow.   Expected Discharge Plan: Skilled Nursing Facility Barriers to Discharge: SNF Pending Medicaid  Expected Discharge Plan and Services Expected Discharge Plan: Skilled Nursing Facility       Living arrangements for the past 2 months: Assisted Living Facility Expected Discharge Date: 01/26/21                                     Social Determinants of Health (SDOH) Interventions    Readmission Risk Interventions Readmission Risk Prevention Plan 01/22/2021  Transportation Screening Complete  HRI or Home Care Consult Complete  Social Work Consult for Recovery Care Planning/Counseling Complete  Palliative Care Screening Not Applicable  Medication Review [employer]) Complete  Some recent data might be hidden

## 2021-02-09 DIAGNOSIS — I739 Peripheral vascular disease, unspecified: Secondary | ICD-10-CM | POA: Diagnosis not present

## 2021-02-09 DIAGNOSIS — M86172 Other acute osteomyelitis, left ankle and foot: Secondary | ICD-10-CM | POA: Diagnosis not present

## 2021-02-09 DIAGNOSIS — F310 Bipolar disorder, current episode hypomanic: Secondary | ICD-10-CM | POA: Diagnosis not present

## 2021-02-09 DIAGNOSIS — M869 Osteomyelitis, unspecified: Secondary | ICD-10-CM | POA: Diagnosis not present

## 2021-02-09 NOTE — Progress Notes (Signed)
[facility]                                                                              Patient Demographics  [name] [name], is a 60 y.o. male, DOB - [birth year], [id number]  Admit date - 01/20/2021   Admitting Physician [name] Eva, MD  Outpatient Primary MD for the patient is [name] Mc, NP  Outpatient specialists:   LOS - 19  days   Medical records reviewed and are as summarized below:    Chief Complaint  Patient presents with   Toe Pain       Brief summary   60 y.o. male with medical history of MDD with psychosis, seizure disorder, hypertension, tobacco abuse, cognitive impairment, atrial flutter with RVR, chronic hepatitis C, history of cocaine and alcohol abuse presenting due to abnormality with his left second toe.  The patient is a poor historian.  When asked why he is in the emergency department, he states " they tell me there is something wrong with my foot".  Apparently, the patient was getting his toenails trimmed at his assisted living facility [facility]) when it was noticed there was some abnormality and discoloration of his left second toe.  As result, the patient was sent to ED for further evaluation.  MRI showed active osteomyelitis in the middle and distal phalanges of the second toe with surrounding cellulitis.  11/16- 11/22: Medically stable, awaiting for skilled nursing facility for IV antibiotics.  Difficult placement.   Assessment & Plan     Acute osteomyelitis of toe ([name]), second toe of the left foot, cellulitis -ABI 1.1 on the left, 1.09 on the right. -Surgery was consulted, recommended conservative management -Placed on daptomycin and ceftriaxone with plan for 4 weeks of IV antibiotics, last dose on 02/17/2021 -PICC line placed on 11/3, -Labs on 11/17 CBC, CMET stable, CK 71, next labs on 11/24 -Medically stable, awaiting skilled nursing facility  Bipolar disorder -Stable, no acute issues.  Continue Seroquel, sertraline,  trazodone, hydroxyzine.    History of atrial flutter -Continue metoprolol, apixaban.   -Diltiazem on hold due to bradycardia   Seizure disorder -Continue lamotrigine, Keppra, Vimpat  Tobacco use -Counseled on smoking cessation  Chronic hepatitis C -No acute issues  Chronic thrombocytopenia -Likely due to hepatitis C, platelet count has been stable.  Code Status: Full code DVT Prophylaxis:   apixaban (ELIQUIS) tablet 5 mg   Level of Care: Level of care: Med-Surg Family Communication: Discussed all imaging results, lab results, explained to the patient   Disposition Plan:     Status is: Inpatient  Remains inpatient appropriate because: Medically clear, no acute complaints.  Awaiting skilled nursing facility for IV antibiotics.  His group home is unable to take him with IV antibiotics.   Time Spent in minutes 15 minutes  Procedures:  None  Consultants:   Surgery  Antimicrobials:   Anti-infectives (From admission, onward)    Start     Dose/Rate Route Frequency Ordered Stop   01/25/21 0000  cefTRIAXone 2 g in sodium chloride 0.9 % 100 mL        2 g Intravenous Every 24 hours 01/24/21 1616  01/24/21 0000  DAPTOmycin 700 mg in sodium chloride 0.9 % 50 mL        700 mg Intravenous Daily 01/24/21 1616     01/22/21 1600  DAPTOmycin (CUBICIN) 700 mg in sodium chloride 0.9 % IVPB        700 mg 128 mL/hr over 30 Minutes Intravenous Daily 01/22/21 1248     01/21/21 1330  cefTRIAXone (ROCEPHIN) 2 g in sodium chloride 0.9 % 100 mL IVPB        2 g 200 mL/hr over 30 Minutes Intravenous Every 24 hours 01/21/21 1238     01/21/21 0030  vancomycin (VANCOREADY) IVPB 1500 mg/300 mL  Status:  Discontinued       See Hyperspace for full Linked Orders Report.   1,500 mg 150 mL/hr over 120 Minutes Intravenous Every 12 hours 01/20/21 1226 01/22/21 1248   01/20/21 1545  ceFEPIme (MAXIPIME) 2 g in sodium chloride 0.9 % 100 mL IVPB  Status:  Discontinued        2 g 200 mL/hr over 30  Minutes Intravenous Every 8 hours 01/20/21 1540 01/21/21 1238   01/20/21 1230  vancomycin (VANCOREADY) IVPB 2000 mg/400 mL       See Hyperspace for full Linked Orders Report.   2,000 mg 200 mL/hr over 120 Minutes Intravenous  Once 01/20/21 1226 01/20/21 1450          Medications  Scheduled Meds:  apixaban  5 mg Oral BID   Chlorhexidine Gluconate Cloth  6 each Topical Daily   hydrOXYzine  25 mg Oral BID   lacosamide  150 mg Oral BID   lamoTRIgine  25 mg Oral Daily   levETIRAcetam  1,500 mg Oral BID   melatonin  6 mg Oral QHS   metoprolol succinate  25 mg Oral Daily   nicotine  14 mg Transdermal Daily   QUEtiapine  50 mg Oral BID   And   QUEtiapine  200 mg Oral QHS   sodium chloride flush  10-40 mL Intracatheter Q12H   Continuous Infusions:  cefTRIAXone (ROCEPHIN)  IV 2 g (02/09/21 1356)   DAPTOmycin (CUBICIN)  IV 700 mg (02/08/21 2214)   PRN Meds:.acetaminophen **OR** acetaminophen, haloperidol lactate, sodium chloride flush      Subjective:   [name] [name] was seen and examined today.  No complaints, slept well.  Watching TV.  Awaiting SNF.    Objective:   Vitals:   02/07/21 2000 02/08/21 0340 02/09/21 0349 02/09/21 1257  BP: 125/84 (!) 147/91 112/83 (!) 113/97  Pulse: 83 69 68 65  Resp: 18 16 20 20   Temp: 98 F (36.7 C) 98.4 F (36.9 C) 98.5 F (36.9 C) (!) 97.5 F (36.4 C)  TempSrc: Oral Oral  Oral  SpO2: 96% 99% 94% 95%  Weight:      Height:        Intake/Output Summary (Last 24 hours) at 02/09/2021 1632 Last data filed at 02/09/2021 1300 Gross per 24 hour  Intake 1240 ml  Output 1100 ml  Net 140 ml     Wt Readings from Last 3 Encounters:  01/20/21 106.5 kg  05/31/18 91.8 kg  01/24/18 86.2 kg   Physical Exam General: Alert and oriented x 3, NAD Cardiovascular: S1 S2 clear, RRR. Respiratory: CTAB, no wheezing, rales or rhonchi Gastrointestinal: Soft, nontender, nondistended, NBS Ext: no pedal edema bilaterally Neuro: no new  deficits Skin: Wound left second toe Psych: Normal affect and demeanor, alert and oriented x3    Data Reviewed:  I have personally reviewed following labs and imaging studies  Micro Results No results found for this or any previous visit (from the past 240 hour(s)).  Radiology Reports MR FOOT LEFT W WO CONTRAST  Result Date: 01/21/2021 CLINICAL DATA:  Cellulitis of the left second toe EXAM: MRI OF THE LEFT FOREFOOT WITHOUT AND WITH CONTRAST TECHNIQUE: Multiplanar, multisequence MR imaging of the left forefoot was performed both before and after administration of intravenous contrast. CONTRAST:  41mL GADAVIST GADOBUTROL 1 MMOL/ML IV SOLN COMPARISON:  01/20/2021 FINDINGS: Bones/Joint/Cartilage Diffuse edema signal and enhancement in the middle and distal phalanges of the second toe compatible with active osteomyelitis. There is deformity of the distal phalanx of the great toe with scalloping of the tuft but with little in the way of edema. This may be the result of chronic deformity or remote osteomyelitis but we do not demonstrate striking edema or enhancement in the remaining distal phalanx a thing this less likely to be from acute osteomyelitis. There is some thinning of the overlying soft tissues of the distal toe. Correlate patient history. 5 mm degenerative subcortical cystic lesion or geode in the proximal navicular. Ligaments Lisfranc ligament intact Muscles and Tendons Regional muscular atrophy Soft tissues Subcutaneous edema and enhancement in the second toe compatible with cellulitis. Dorsal subcutaneous edema in the forefoot is not a complete by enhancement and accordingly is not necessarily infectious. IMPRESSION: 1. Active osteomyelitis in the middle and distal phalanges of the second toe with surrounding cellulitis. 2. Deformity of the distal phalanx of the great toe but without abnormal edema or enhancement either in the distal phalanx or in the surrounding soft tissues. Accordingly this  is more likely to represent deformity from an old/chronic process rather than acute osteomyelitis. Electronically Signed   By: Gaylyn Rong M.D.   On: 01/21/2021 06:45   US ARTERIAL ABI (SCREENING LOWER EXTREMITY)  Result Date: 01/21/2021 CLINICAL DATA:  Hypertension Current smoker Left second toe cellulitis Left breast pain EXAM: NONINVASIVE PHYSIOLOGIC VASCULAR STUDY OF BILATERAL LOWER EXTREMITIES TECHNIQUE: Evaluation of both lower extremities were performed at rest, including calculation of ankle-brachial indices with single level Doppler, pressure and pulse volume recording. COMPARISON:  None. FINDINGS: Right ABI:  1.10 Left ABI:  1.09 Right Lower Extremity: Monophasic waveform seen in the right posterior tibial and dorsalis pedis arteries. Left Lower Extremity: Monophasic waveform seen in the posterior tibial and dorsalis pedis arteries. 1.0-1.4 Normal IMPRESSION: Although lower extremity ABI values are within normal limits, monophasic waveforms seen at the ankle bilaterally are suspicious for underlying arterial occlusive disease. Consider further evaluation with CT angiography of the abdominal aorta with runoff. Electronically Signed   By: Acquanetta Belling M.D.   On: 01/21/2021 11:43   DG Foot Complete Left  Result Date: 01/20/2021 CLINICAL DATA:  Second left toe wound EXAM: LEFT FOOT - COMPLETE 3+ VIEW COMPARISON:  None. FINDINGS: Soft tissue swelling of the distal second digit with erosion of the underlying middle and distal phalanx consistent with osteomyelitis. Erosive changes also noted in the distal tip of the distal phalanx of the great toe with overlying soft tissue abnormality suspicious for chronic osteomyelitis. Atherosclerotic changes seen throughout visualized arterial segments. IMPRESSION: Findings suspicious for osteomyelitis of the tip of the second and first toes. Electronically Signed   By: Acquanetta Belling M.D.   On: 01/20/2021 11:47   [place] EKG SITE RITE  Result Date: 01/21/2021 If  Site Rite image not attached, placement could not be confirmed due to current cardiac rhythm.  Lab Data:  CBC: Recent Labs  Lab 02/04/21 0627  WBC 6.9  HGB 15.3  HCT 47.1  MCV 90.9  PLT [id number]   Basic Metabolic Panel: Recent Labs  Lab 02/04/21 0627  NA 138  K 3.7  CL 104  CO2 25  GLUCOSE 122*  BUN 12  CREATININE 0.80  CALCIUM 8.6*   GFR: Estimated Creatinine Clearance: 120.7 mL/min (by C-G formula based on SCr of 0.8 mg/dL). Liver Function Tests: Recent Labs  Lab 02/04/21 0627  AST 40  ALT 40  ALKPHOS 104  BILITOT 0.6  PROT 7.1  ALBUMIN 3.5   No results for input(s): LIPASE, AMYLASE in the last 168 hours. No results for input(s): AMMONIA in the last 168 hours. Coagulation Profile: No results for input(s): INR, PROTIME in the last 168 hours. Cardiac Enzymes: Recent Labs  Lab 02/04/21 0627  CKTOTAL 71   BNP (last 3 results) No results for input(s): PROBNP in the last 8760 hours. HbA1C: No results for input(s): HGBA1C in the last 72 hours. CBG: No results for input(s): GLUCAP in the last 168 hours. Lipid Profile: No results for input(s): CHOL, HDL, LDLCALC, TRIG, CHOLHDL, LDLDIRECT in the last 72 hours. Thyroid Function Tests: No results for input(s): TSH, T4TOTAL, FREET4, T3FREE, THYROIDAB in the last 72 hours. Anemia Panel: No results for input(s): VITAMINB12, FOLATE, FERRITIN, TIBC, IRON, RETICCTPCT in the last 72 hours. Urine analysis:    Component Value Date/Time   COLORURINE YELLOW 12/12/2017 [place] 12/12/2017 0931   LABSPEC 1.023 12/12/2017 0931   PHURINE 6.0 12/12/2017 0931   GLUCOSEU NEGATIVE 12/12/2017 0931   HGBUR NEGATIVE 12/12/2017 0931   BILIRUBINUR NEGATIVE 12/12/2017 0931   BILIRUBINUR [place] 10/19/2015 1526   KETONESUR NEGATIVE 12/12/2017 0931   PROTEINUR NEGATIVE 12/12/2017 0931   UROBILINOGEN >=8.0 10/19/2015 1526   UROBILINOGEN 0.2 10/31/2014 1840   NITRITE NEGATIVE 12/12/2017 0931   LEUKOCYTESUR NEGATIVE  12/12/2017 0931     Jacqualyn Sedgwick M.D. [facility] 02/09/2021, 4:32 PM  Available via Epic secure chat 7am-7pm After 7 pm, please refer to night coverage provider listed on amion.

## 2021-02-09 NOTE — TOC Progression Note (Signed)
Transition of Care [facility]) - Progression Note    Patient Details  Name: [name] [name] MRN: [id number] Date of Birth: 1960-11-18  Transition of Care [facility]) CM/SW Contact  Barry Brunner, LCSW Phone Number: 02/09/2021, 2:59 PM  Clinical Narrative:    [name] agreeable to also review patient. CSW recent referral. TOC to follow.   Expected Discharge Plan: Skilled Nursing Facility Barriers to Discharge: SNF Pending Medicaid  Expected Discharge Plan and Services Expected Discharge Plan: Skilled Nursing Facility       Living arrangements for the past 2 months: Assisted Living Facility Expected Discharge Date: 01/26/21                                     Social Determinants of Health (SDOH) Interventions    Readmission Risk Interventions Readmission Risk Prevention Plan 01/22/2021  Transportation Screening Complete  HRI or Home Care Consult Complete  Social Work Consult for Recovery Care Planning/Counseling Complete  Palliative Care Screening Not Applicable  Medication Review Oceanographer) Complete  Some recent data might be hidden

## 2021-02-10 DIAGNOSIS — M86172 Other acute osteomyelitis, left ankle and foot: Secondary | ICD-10-CM | POA: Diagnosis not present

## 2021-02-10 DIAGNOSIS — F310 Bipolar disorder, current episode hypomanic: Secondary | ICD-10-CM | POA: Diagnosis not present

## 2021-02-10 NOTE — NC FL2 (Signed)
[place] LEVEL OF CARE SCREENING TOOL     IDENTIFICATION  Patient Name: [name] [name] Birthdate: 07/05/1960 Sex: male Admission Date (Current Location): 01/20/2021  [facility] and [place] Number:  [employer] and Address:  [place] [address], [place]      Provider Number: [id number]  Attending Physician Name and Address:  Barb Merino, MD  Relative Name and Phone Number:  Fabio Asa [facility] Owner [phone]    Current Level of Care: Hospital Recommended Level of Care: [place], [place] Prior Approval Number:    Date Approved/Denied:   PASRR Number:    Discharge Plan: Other (Comment) (Group home)    Current Diagnoses: Patient Active Problem List   Diagnosis Date Noted   Osteomyelitis of second toe of left foot (HCC)    Acute osteomyelitis of toe ([place]) 01/20/2021   Cellulitis of second toe of left foot    Posttraumatic encephalopathy 08/20/2018   H/O prior ablation treatment 05/31/2018   Cocaine abuse with cocaine-induced mood disorder ([place]) 10/26/2017   S/P ablation of atrial fibrillation 07/03/2017   Insomnia 05/07/2017   Atrial flutter ([place]) 03/03/2017   Late effect of cerebrovascular accident (CVA) 01/21/2017   Bipolar disorder ([place]) 01/21/2017   Migraine headache 01/21/2017   Atrial flutter with rapid ventricular response ([place]) 01/10/2017   Syncope 01/10/2017   Gait abnormality 05/26/2016   Smoking 01/05/2015   Chronic pain syndrome 01/05/2015   Non-healing non-surgical wound 12/12/2014   Fall    Paranoia [facility])    Dementia with behavioral disturbance    Encephalopathy, hepatic 10/09/2014   Hereditary and idiopathic peripheral neuropathy 06/03/2014   Seizures ([place]) 01/24/2014   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/23/2014   Fracture of olecranon process of ulna 11/08/2012   Osteomyelitis ([place]) 11/08/2012   Essential hypertension, benign 10/31/2012   History of alcohol  abuse 10/26/2012   Chronic hepatitis C with hepatic coma ([place]) 10/26/2012    Orientation RESPIRATION BLADDER Height & Weight     Self, Time, Situation, Place  Normal Continent Weight: 234 lb 14.4 oz (106.5 kg) Height:  6\' 5"  (195.6 cm)  BEHAVIORAL SYMPTOMS/MOOD NEUROLOGICAL BOWEL NUTRITION STATUS      Continent Diet (See DC summary)  AMBULATORY STATUS COMMUNICATION OF NEEDS Skin   Limited Assist Verbally Other (Comment) (Left Toe infection)                       Personal Care Assistance Level of Assistance  Bathing, Feeding, Dressing Bathing Assistance: Limited assistance Feeding assistance: Limited assistance Dressing Assistance: Limited assistance     Functional Limitations Info  Sight, Hearing, Speech Sight Info: Adequate Hearing Info: Adequate Speech Info: Adequate    SPECIAL CARE FACTORS FREQUENCY   (IV antibiotics)                    Contractures Contractures Info: Not present    Additional Factors Info  Code Status, Allergies Code Status Info: FULL Allergies Info: Carbamazepine, depakote           Current Medications (02/10/2021):  This is the current hospital active medication list Current Facility-Administered Medications  Medication Dose Route Frequency Provider Last Rate Last Admin   acetaminophen (TYLENOL) tablet 650 mg  650 mg Oral Q6H PRN Orson Eva, MD   650 mg at 02/09/21 2020   Or   acetaminophen (TYLENOL) suppository 650 mg  650 mg Rectal Q6H PRN Orson Eva, MD  650 mg at 02/06/21 2323   apixaban (ELIQUIS) tablet 5 mg  5 mg Oral BID Tat, Onalee Hua, MD   5 mg at 02/10/21 1043   cefTRIAXone (ROCEPHIN) 2 g in sodium chloride 0.9 % 100 mL IVPB  2 g Intravenous Q24H Tat, Onalee Hua, MD 200 mL/hr at 02/09/21 1356 2 g at 02/09/21 1356   Chlorhexidine Gluconate Cloth 2 % PADS 6 each  6 each Topical Daily Tat, David, MD   6 each at 02/08/21 0848   DAPTOmycin (CUBICIN) 700 mg in sodium chloride 0.9 % IVPB  700 mg Intravenous Q2000 Catarina Hartshorn, MD 128  mL/hr at 02/09/21 2020 700 mg at 02/09/21 2020   haloperidol lactate (HALDOL) injection 5 mg  5 mg Intravenous Q6H PRN Catarina Hartshorn, MD   5 mg at 01/25/21 2018   hydrOXYzine (ATARAX/VISTARIL) tablet 25 mg  25 mg Oral BID Tat, Onalee Hua, MD   25 mg at 02/10/21 1043   lacosamide (VIMPAT) tablet 150 mg  150 mg Oral BID Tat, Onalee Hua, MD   150 mg at 02/10/21 1043   lamoTRIgine (LAMICTAL) tablet 25 mg  25 mg Oral Daily Tat, David, MD   25 mg at 02/10/21 1043   levETIRAcetam (KEPPRA) tablet 1,500 mg  1,500 mg Oral BID Tat, Onalee Hua, MD   1,500 mg at 02/10/21 1042   melatonin tablet 6 mg  6 mg Oral QHS Rai, Ripudeep K, MD   6 mg at 02/09/21 2021   metoprolol succinate (TOPROL-XL) 24 hr tablet 25 mg  25 mg Oral Daily Tat, David, MD   25 mg at 02/10/21 1042   nicotine (NICODERM CQ - dosed in mg/24 hours) patch 14 mg  14 mg Transdermal Daily Tat, David, MD   14 mg at 02/10/21 1042   QUEtiapine (SEROQUEL) tablet 50 mg  50 mg Oral BID Tat, Onalee Hua, MD   50 mg at 02/10/21 1042   And   QUEtiapine (SEROQUEL) tablet 200 mg  200 mg Oral QHS Catarina Hartshorn, MD   200 mg at 02/09/21 2021   sodium chloride flush (NS) 0.9 % injection 10-40 mL  10-40 mL Intracatheter Q12H Tat, Onalee Hua, MD   10 mL at 02/10/21 1043   sodium chloride flush (NS) 0.9 % injection 10-40 mL  10-40 mL Intracatheter PRN Catarina Hartshorn, MD   10 mL at 01/21/21 2141     Discharge Medications: Please see discharge summary for a list of discharge medications.  Relevant Imaging Results:  Relevant Lab Results:   Additional Information SS# [id number]  Barry Brunner, LCSW

## 2021-02-10 NOTE — TOC Progression Note (Signed)
Transition of Care [facility]) - Progression Note    Patient Details  Name: [name] [name] MRN: [id number] Date of Birth: 12-08-1960  Transition of Care [facility]) CM/SW Contact  Barry Brunner, LCSW Phone Number: 02/10/2021, 12:36 PM  Clinical Narrative:    CSW notified that patient only requires 7 days more of antibiotics. CSW called [facility] to inquire if they will take patient back once antibiotics treatment is done. [facility] reported that they are not able to meet patient's needs and take patient back. CSW update FL2 for group home, and faxed patient out. TOC to follow.   Expected Discharge Plan: Skilled Nursing Facility Barriers to Discharge: SNF Pending Medicaid  Expected Discharge Plan and Services Expected Discharge Plan: Skilled Nursing Facility       Living arrangements for the past 2 months: Assisted Living Facility Expected Discharge Date: 01/26/21                                     Social Determinants of Health (SDOH) Interventions    Readmission Risk Interventions Readmission Risk Prevention Plan 01/22/2021  Transportation Screening Complete  HRI or Home Care Consult Complete  Social Work Consult for Recovery Care Planning/Counseling Complete  Palliative Care Screening Not Applicable  Medication Review Oceanographer) Complete  Some recent data might be hidden

## 2021-02-10 NOTE — Progress Notes (Signed)
Patient was seen and examined.  No new events.  Patient tells me that he is not supposed to be in the hospital.  Apparently we are waiting for skilled nursing facility to complete his antibiotics that will be ending on 11/30.  No new issues.  Continue IV antibiotics.  Pharmacy monitoring for levels. If we do not find any skilled nursing facility for patient, may need to go back to group home after completing antibiotics next week.   Total time spent: 15 minutes.

## 2021-02-10 NOTE — Plan of Care (Signed)

## 2021-02-11 DIAGNOSIS — M86172 Other acute osteomyelitis, left ankle and foot: Secondary | ICD-10-CM | POA: Diagnosis not present

## 2021-02-11 DIAGNOSIS — F310 Bipolar disorder, current episode hypomanic: Secondary | ICD-10-CM | POA: Diagnosis not present

## 2021-02-11 LAB — CK: Total CK: 80 U/L (ref 49–397)

## 2021-02-11 NOTE — Progress Notes (Signed)
Patient a bit grumpy today. With it being Thanksgiving, some of our usual snacks are not stocked. Though staff has hunted multiple times to different floors for him, he is a bit sore over the lack of sodas. Forgetful, stated he didn't get lunch however he had a tray earlier and appeared to have ate it all. Found another tray and several other items from the kitchen under his bed. Patient has been refusing baths, even though they have been asked about multiple times. Has taken his meds without difficulty, and otherwise pleasant.

## 2021-02-11 NOTE — Plan of Care (Signed)

## 2021-02-11 NOTE — Progress Notes (Signed)
Patient was seen and examined.  No new events.  Getting bedside care by nurses.  No complaints.  No new issues.  Continue IV antibiotics.  Pharmacy monitoring for levels. If we do not find any skilled nursing facility for patient, may need to go back to group home after completing antibiotics next week.   Total time spent: 15 minutes.

## 2021-02-12 DIAGNOSIS — M86172 Other acute osteomyelitis, left ankle and foot: Secondary | ICD-10-CM | POA: Diagnosis not present

## 2021-02-12 DIAGNOSIS — F310 Bipolar disorder, current episode hypomanic: Secondary | ICD-10-CM | POA: Diagnosis not present

## 2021-02-12 NOTE — Progress Notes (Signed)
Patient seen and examined.  No new events.  Pleasant, sometimes impulsive and forgetful which is his baseline.  Plan: Continue IV antibiotics until 11/30.  Social worker working on placement.  No new orders.  Total time spent: 15 minutes.

## 2021-02-12 NOTE — Progress Notes (Signed)
Attempted to give patient a bath. He would not let me help and would not help himself to clean his lower half. Did allow me to take off his scrub top, and clean his upper body including his face. He also allowed me to clean his lower legs and wash his feet. Bilat feet dry and scaly with areas of blackened thick skin to toes. Has been given many snacks and drinks throughout the day. Patient has been hoarding food, so attempted to clean patient's room and wipe everything down with sanitizing wipes. He has also been assisted with watching movies for distraction.

## 2021-02-12 NOTE — Progress Notes (Signed)
Patient continues to be very forgetful. Loves to eat. Took meds without difficulty but refusing to be bathed again. States "I did that yesterday". Explained that he did not, and he states he will allow it "much later today". Will attempt.

## 2021-02-12 NOTE — Progress Notes (Signed)
Patient's PiCC line dressing changed while patient agreeable. Tolerated well.

## 2021-02-12 NOTE — TOC Progression Note (Signed)
Transition of Care [facility]) - Progression Note    Patient Details  Name: [name] [name] MRN: [id number] Date of Birth: 04-Jan-1961  Transition of Care [facility]) CM/SW Contact  Barry Brunner, LCSW Phone Number: 02/12/2021, 2:13 PM  Clinical Narrative:    CSW contacted [facility] with [facility] [place], [facility] [place], [facility] [place], [place], [place], and [place] to request review of patient for LTC instead of SNF. [facility] declined patient. [facility] and [facility] agreeable to review patient for LTC. [place] reported that they are not takeing patients. CSW received no answer from [facility].CSW not able to leave [facility]. CSW LVM with Selena Batten of [facility]. TOC to follow.   Expected Discharge Plan: Skilled Nursing Facility Barriers to Discharge: SNF Pending Medicaid, SNF Pending bed offer  Expected Discharge Plan and Services Expected Discharge Plan: Skilled Nursing Facility       Living arrangements for the past 2 months: Assisted Living Facility Expected Discharge Date: 01/26/21                                     Social Determinants of Health (SDOH) Interventions    Readmission Risk Interventions Readmission Risk Prevention Plan 01/22/2021  Transportation Screening Complete  HRI or Home Care Consult Complete  Social Work Consult for Recovery Care Planning/Counseling Complete  Palliative Care Screening Not Applicable  Medication Review Oceanographer) Complete  Some recent data might be hidden

## 2021-02-12 NOTE — NC FL2 (Signed)
[place] MEDICAID FL2 LEVEL OF CARE SCREENING TOOL     IDENTIFICATION  Patient Name: [name] [name] Birthdate: [birth year] Sex: male Admission Date (Current Location): 01/20/2021  [facility] and [place] Number:  [employer] and Address:  [facility],  [address], [place] [address]      Provider Number: [id number]  Attending Physician Name and Address:  Dorcas Carrow, MD  Relative Name and Phone Number:  Dorthula Perfect [facility] Owner [phone]    Current Level of Care: Hospital Recommended Level of Care: Other (Comment), Assisted Living Facility (or LTC) Prior Approval Number:    Date Approved/Denied:   PASRR Number:    Discharge Plan: Other (Comment) (Group home)    Current Diagnoses: Patient Active Problem List   Diagnosis Date Noted   Osteomyelitis of second toe of left foot (HCC)    Acute osteomyelitis of toe (HCC) 01/20/2021   Cellulitis of second toe of left foot    Posttraumatic encephalopathy 08/20/2018   H/O prior ablation treatment 05/31/2018   Cocaine abuse with cocaine-induced mood disorder (HCC) 10/26/2017   S/P ablation of atrial fibrillation 07/03/2017   Insomnia 05/07/2017   Atrial flutter (HCC) 03/03/2017   Late effect of cerebrovascular accident (CVA) 01/21/2017   Bipolar disorder (HCC) 01/21/2017   Migraine headache 01/21/2017   Atrial flutter with rapid ventricular response (HCC) 01/10/2017   Syncope 01/10/2017   Gait abnormality 05/26/2016   Smoking 01/05/2015   Chronic pain syndrome 01/05/2015   Non-healing non-surgical wound 12/12/2014   Fall    Paranoia [facility])    Dementia with behavioral disturbance    Encephalopathy, hepatic 10/09/2014   Hereditary and idiopathic peripheral neuropathy 06/03/2014   Seizures (HCC) 01/24/2014   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/23/2014   Fracture of olecranon process of ulna 11/08/2012   Osteomyelitis (HCC) 11/08/2012   Essential hypertension, benign 10/31/2012   History of  alcohol abuse 10/26/2012   Chronic hepatitis C with hepatic coma (HCC) 10/26/2012    Orientation RESPIRATION BLADDER Height & Weight     Self, Time, Situation, Place  Normal Continent Weight: 234 lb 14.4 oz (106.5 kg) Height:  6\' 5"  (195.6 cm)  BEHAVIORAL SYMPTOMS/MOOD NEUROLOGICAL BOWEL NUTRITION STATUS      Continent Diet (See DC summary)  AMBULATORY STATUS COMMUNICATION OF NEEDS Skin   Limited Assist Verbally Other (Comment) (Left Toe infection)                       Personal Care Assistance Level of Assistance  Bathing, Feeding, Dressing Bathing Assistance: Limited assistance Feeding assistance: Limited assistance Dressing Assistance: Limited assistance     Functional Limitations Info  Sight, Hearing, Speech Sight Info: Adequate Hearing Info: Adequate Speech Info: Adequate    SPECIAL CARE FACTORS FREQUENCY   (IV antibiotics)                    Contractures Contractures Info: Not present    Additional Factors Info  Code Status, Allergies Code Status Info: FULL Allergies Info: Carbamazepine, depakote           Current Medications (02/12/2021):  This is the current hospital active medication list Current Facility-Administered Medications  Medication Dose Route Frequency Provider Last Rate Last Admin   acetaminophen (TYLENOL) tablet 650 mg  650 mg Oral Q6H PRN Tat, 02/14/2021, MD   650 mg at 02/12/21 0800   Or   acetaminophen (TYLENOL) suppository 650 mg  650 mg Rectal Q6H PRN 02/14/21, MD  650 mg at 02/06/21 2323   apixaban (ELIQUIS) tablet 5 mg  5 mg Oral BID Tat, David, MD   5 mg at 02/12/21 0800   cefTRIAXone (ROCEPHIN) 2 g in sodium chloride 0.9 % 100 mL IVPB  2 g Intravenous Q24H Orson Eva, MD 200 mL/hr at 02/11/21 1356 2 g at 02/11/21 1356   Chlorhexidine Gluconate Cloth 2 % PADS 6 each  6 each Topical Daily Tat, David, MD   6 each at 02/08/21 0848   DAPTOmycin (CUBICIN) 700 mg in sodium chloride 0.9 % IVPB  700 mg Intravenous Q2000 Orson Eva, MD  128 mL/hr at 02/11/21 2152 700 mg at 02/11/21 2152   haloperidol lactate (HALDOL) injection 5 mg  5 mg Intravenous Q6H PRN Orson Eva, MD   5 mg at 01/25/21 2018   hydrOXYzine (ATARAX/VISTARIL) tablet 25 mg  25 mg Oral BID Orson Eva, MD   25 mg at 02/12/21 0801   lacosamide (VIMPAT) tablet 150 mg  150 mg Oral BID Tat, Shanon Brow, MD   150 mg at 02/12/21 0800   lamoTRIgine (LAMICTAL) tablet 25 mg  25 mg Oral Daily Tat, Shanon Brow, MD   25 mg at 02/12/21 0801   levETIRAcetam (KEPPRA) tablet 1,500 mg  1,500 mg Oral BID Tat, Shanon Brow, MD   1,500 mg at 02/12/21 0801   melatonin tablet 6 mg  6 mg Oral QHS Rai, Ripudeep K, MD   6 mg at 02/11/21 2145   metoprolol succinate (TOPROL-XL) 24 hr tablet 25 mg  25 mg Oral Daily Tat, David, MD   25 mg at 02/12/21 0800   nicotine (NICODERM CQ - dosed in mg/24 hours) patch 14 mg  14 mg Transdermal Daily Tat, Shanon Brow, MD   14 mg at 02/12/21 0802   QUEtiapine (SEROQUEL) tablet 50 mg  50 mg Oral BID Tat, Shanon Brow, MD   50 mg at 02/12/21 [id number]   And   QUEtiapine (SEROQUEL) tablet 200 mg  200 mg Oral QHS Orson Eva, MD   200 mg at 02/11/21 2145   sodium chloride flush (NS) 0.9 % injection 10-40 mL  10-40 mL Intracatheter Q12H TatShanon Brow, MD   10 mL at 02/12/21 0801   sodium chloride flush (NS) 0.9 % injection 10-40 mL  10-40 mL Intracatheter PRN Orson Eva, MD   10 mL at 01/21/21 2141     Discharge Medications: Please see discharge summary for a list of discharge medications.  Relevant Imaging Results:  Relevant Lab Results:   Additional Information SS# [id number]  Natasha Bence, LCSW

## 2021-02-13 DIAGNOSIS — F310 Bipolar disorder, current episode hypomanic: Secondary | ICD-10-CM | POA: Diagnosis not present

## 2021-02-13 DIAGNOSIS — M86172 Other acute osteomyelitis, left ankle and foot: Secondary | ICD-10-CM | POA: Diagnosis not present

## 2021-02-13 LAB — CBC
HCT: 42 % (ref 39.0–52.0)
Hemoglobin: 14.3 g/dL (ref 13.0–17.0)
MCH: 30.6 pg (ref 26.0–34.0)
MCHC: 34 g/dL (ref 30.0–36.0)
MCV: 89.7 fL (ref 80.0–100.0)
Platelets: 114 10*3/uL — ABNORMAL LOW (ref 150–400)
RBC: 4.68 MIL/uL (ref 4.22–5.81)
RDW: 17.3 % — ABNORMAL HIGH (ref 11.5–15.5)
WBC: 6.3 10*3/uL (ref 4.0–10.5)
nRBC: 0 % (ref 0.0–0.2)

## 2021-02-13 NOTE — Progress Notes (Signed)
Patient seen and examined.  Were cheerful eating breakfast.  Denies any complaints.  Occasionally impulsive but at his baseline for his mental status.  Plan: No new changes today.  Continue antibiotics until 11/30 and anticipate discharge to long-term care facility.  Total time spent: 15 minutes

## 2021-02-13 NOTE — Plan of Care (Signed)
  Problem: Coping: Goal: Level of anxiety will decrease Outcome: Progressing   Problem: Pain Managment: Goal: General experience of comfort will improve Outcome: Progressing   Problem: Safety: Goal: Ability to remain free from injury will improve Outcome: Progressing   

## 2021-02-14 DIAGNOSIS — M86172 Other acute osteomyelitis, left ankle and foot: Secondary | ICD-10-CM | POA: Diagnosis not present

## 2021-02-14 DIAGNOSIS — F310 Bipolar disorder, current episode hypomanic: Secondary | ICD-10-CM | POA: Diagnosis not present

## 2021-02-14 NOTE — Progress Notes (Signed)
Patient seen and examined.  Very pleasant to this provider.  Does not quite understand why he is in the hospital.  No new events.  Plan: No new orders.  Patient to continue and complete IV antibiotics until 11/30 and anticipate discharge to long-term care.  Social worker on board.  Total time spent: 15 minutes.

## 2021-02-14 NOTE — Progress Notes (Signed)
At the beginning of the shift, patient stated that he did not receive a dinner. A bag of crackers, graham crackers and peanut butter given to patient and because of low supply, informed patient that we would not be providing him anymore snacks tonight.  Patient agreeable to this and thankful for the crackers and peanut butter.  Patient has been asking for more crackers and peanut butter since this time.  These requests have been denied because he still has the bag at the head of his bed.  He also has various other food items on his bedside table.

## 2021-02-15 DIAGNOSIS — M86172 Other acute osteomyelitis, left ankle and foot: Secondary | ICD-10-CM | POA: Diagnosis not present

## 2021-02-15 DIAGNOSIS — F310 Bipolar disorder, current episode hypomanic: Secondary | ICD-10-CM | POA: Diagnosis not present

## 2021-02-15 NOTE — Progress Notes (Signed)
Patient seen and examined.  No overnight events.  He was very upset because of restrictions on salt and sweet on his coffee.  Otherwise denies any complaints.  Plan: No new orders.  Continue and complete IV antibiotics with right upper arm PICC line until 11/30 and anticipate long-term placement.  Patient is medically stable. Changed to regular diet.  No restrictions eating.  Total time spent: 15 minutes.

## 2021-02-15 NOTE — Plan of Care (Signed)
  Problem: Health Behavior/Discharge Planning: Goal: Ability to manage health-related needs will improve Outcome: Progressing   Problem: Clinical Measurements: Goal: Diagnostic test results will improve Outcome: Progressing   Problem: Activity: Goal: Risk for activity intolerance will decrease Outcome: Progressing   Problem: Nutrition: Goal: Adequate nutrition will be maintained Outcome: Progressing   Problem: Coping: Goal: Level of anxiety will decrease Outcome: Progressing   

## 2021-02-15 NOTE — Progress Notes (Signed)
Nutrition Brief Note  Per MD- 61 y.o. male with medical history of MDD with psychosis, seizure disorder, hypertension, tobacco abuse, cognitive impairment, atrial flutter with RVR, chronic hepatitis C, history of cocaine and alcohol abuse presenting due to abnormality with his left second toe.   Patient is awaiting placement and has been medically cleared for some time now.  Wt Readings from Last 15 Encounters:  01/20/21 106.5 kg  05/31/18 91.8 kg  01/24/18 86.2 kg  12/21/17 92 kg  12/12/17 92 kg  12/08/17 95 kg  11/17/17 95.3 kg  11/13/17 83 kg  10/25/17 83.9 kg  05/26/17 104.8 kg  05/24/17 104.8 kg  05/18/17 104.8 kg  05/04/17 109.3 kg  04/13/17 109.3 kg  04/12/17 109.3 kg    Body mass index is 27.86 kg/m. Patient meets criteria for overweight based on current BMI.   Current diet order is Regular, patient is consuming approximately 100% of most  meals since admission. Feeds himself.   Labs and medications reviewed.  BMP Latest Ref Rng & Units 02/04/2021 01/28/2021 01/23/2021  Glucose 70 - 99 mg/dL 158(E) 825(R) 92  BUN 8 - 23 mg/dL 12 20 12   Creatinine 0.61 - 1.24 mg/dL 4.93 5.52  BUN/Creat Ratio 6 - 22 (calc) - - -  Sodium 135 - 145 mmol/L 138 140 138  Potassium 3.5 - 5.1 mmol/L 3.7 4.7 4.3  Chloride 98 - 111 mmol/L 104 103 109  CO2 22 - 32 mmol/L 25 29 24   Calcium 8.9 - 10.3 mg/dL 1.74) ) 7.1(T)     No nutrition interventions warranted at this time. If nutrition issues arise, please consult RD.   9.5(Z MS,RD,CSG,LDN Contact: 9.6(D

## 2021-02-16 DIAGNOSIS — F310 Bipolar disorder, current episode hypomanic: Secondary | ICD-10-CM | POA: Diagnosis not present

## 2021-02-16 DIAGNOSIS — M86172 Other acute osteomyelitis, left ankle and foot: Secondary | ICD-10-CM | POA: Diagnosis not present

## 2021-02-16 NOTE — Progress Notes (Signed)
Please see detailed progress note 11/22 by Dr. Isidoro Donning.  Patient with history of psychosis, seizure disorder, hypertension, cognitive impairment, a flutter with RVR and chronic hepatitis C who presented to the emergency room with osteomyelitis of the left multiple toes.  Surgery decided not a good candidate for surgery so he was treated with prolonged IV antibiotics.  Could not be placed in the facility with PICC line.  Receiving IV antibiotics, last dose will be 11/30.  He is medically stable and will be able to transfer to long-term care after completing antibiotics tomorrow.  Patient seen and examined.  No overnight events.  Today he was happy to eat regular diet.  He talks about how he is very interested in talking about different races, religion, color of the people and how he loves people with different backgrounds. Denies any pain.  He knows he has to go to long-term care from the hospital.  Intermittently gets distracted with tangential thoughts. No new plans.  Total time spent: 15 minutes.

## 2021-02-17 MED ORDER — DAPTOMYCIN 500 MG IV SOLR
INTRAVENOUS | Status: AC
  Filled 2021-02-17: qty 20

## 2021-02-17 MED ORDER — SODIUM CHLORIDE 0.9 % IV SOLN
2.0000 g | INTRAVENOUS | Status: AC
  Administered 2021-02-17: 2 g via INTRAVENOUS
  Filled 2021-02-17: qty 20

## 2021-02-17 MED ORDER — SODIUM CHLORIDE 0.9 % IV SOLN
700.0000 mg | Freq: Every day | INTRAVENOUS | Status: AC
  Administered 2021-02-17: 700 mg via INTRAVENOUS
  Filled 2021-02-17: qty 14

## 2021-02-17 NOTE — Progress Notes (Signed)
PROGRESS NOTE  [name] [name] TJQ:[id number] DOB: 10-01-1960 DOA: 01/20/2021 PCP: [name], NP  Brief History:   60 y.o. male with medical history of MDD with psychosis, seizure disorder, hypertension, tobacco abuse, cognitive impairment, atrial flutter with RVR, chronic hepatitis C, history of cocaine and alcohol abuse presenting due to abnormality with his left second toe.  The patient is a poor historian.  When asked why he is in the emergency department, he states " they tell me there is something wrong with my foot".  Apparently, the patient was getting his toenails trimmed at his assisted living facility [facility]) when it was noticed there was some abnormality and discoloration of his left second toe.  As result, the patient was sent to the emergency department for further evaluation.  The patient himself denies any pain or discomfort about his second toe.  He is unable to provide any history regarding recent injury or trauma.  He states, " I did not there was something wrong with my toe."  He denies any fevers, chills, chest pain, shortness breath, cough, hemoptysis, nausea, vomiting, diarrhea, domino pain, foot pain.  He continues to smoke tobacco.   ED In the emergency department, the patient was afebrile hemodynamically stable with oxygen saturation 100% room air.  BMP showed a sodium 141, potassium 3.9, serum creatinine 1.04.  WBC 7.1, hemoglobin 14.9, platelets 124,000.  Patient was given a dose of vancomycin.  X-ray of the left foot showed soft tissue swelling with erosion of the distal and middle phalanx of the left second toe.  Admission was requested for further work-up and treatment.    Assessment/Plan: Cellulitis left second toe/Acute osteomyelitis -ESR--5 -CRP--0.7 -Check ABIs--left = 1.10; right = 1.09 -Patient has palpable dorsalis pedis pulses bilateral -Surgery consult noted>>nonoperative management -Continue vancomycin>>changed to daptomycin -changed to  ceftriaxone -MR left foot--acute osteomyelitis middle and distal phalanges 2nd toe -PICC line placed 11/3 -plan 4 weeks IV abx--last day 02/17/21 -Check BMP, CBC, CRP, ESR -plan to d/c with 2 more weeks po abx  if bed available   Bipolar disorder -Continue quetiapine, sertraline, trazodone -Continue hydroxyzine -stable  Atrial flutter -Continue apixaban -Continue metoprolol succinate -hold diltiazem due to bradycardia   Seizure disorder -Continue lamotrigine, Keppra, Vimpat   Tobacco abuse -Tobacco cessation discussed   Thrombocytopenia -Likely secondary to chronic hepatitis C -stable -Monitor for signs of bleeding   Chronic hepatitis C -Appears clinically compensated               Family Communication:   no Family at bedside  Consultants:  general surgery  Code Status:  FULL   DVT Prophylaxis:  apixaban   Procedures: As Listed in Progress Note Above  Antibiotics: Ceftriaxone daptomycin      Subjective: Patient denies fevers, chills, headache, chest pain, dyspnea, nausea, vomiting, diarrhea, abdominal pain, dysuria, hematuria, hematochezia, and melena.   Objective: Vitals:   02/16/21 1225 02/16/21 2047 02/17/21 0458 02/17/21 1300  BP: 115/86 (!) 141/78 115/77 132/68  Pulse: (!) 59 71 66 72  Resp: 16 20 19 19   Temp: 98.7 F (37.1 C) (!) 97 F (36.1 C) 97.7 F (36.5 C) 97.9 F (36.6 C)  TempSrc:   Oral Oral  SpO2: 94% 98% 94% 96%  Weight:      Height:        Intake/Output Summary (Last 24 hours) at 02/17/2021 1543 Last data filed at 02/17/2021 1100 Gross per 24 hour  Intake 960 ml  Output  2350 ml  Net -1390 ml   Weight change:  Exam:  General:  Pt is alert, follows commands appropriately, not in acute distress HEENT: No icterus, No thrush, No neck mass, [place]/AT Cardiovascular: RRR, S1/S2, no rubs, no gallops Respiratory: CTA bilaterally, no wheezing, no crackles, no rhonchi Abdomen: Soft/+BS, non tender, non distended, no  guarding Extremities: left second toe without erythema, drainage, tenderness.  DP pulse palpable on left and right feet   Data Reviewed: I have personally reviewed following labs and imaging studies Basic Metabolic Panel: No results for input(s): NA, K, CL, CO2, GLUCOSE, BUN, CREATININE, CALCIUM, MG, PHOS in the last 168 hours. Liver Function Tests: No results for input(s): AST, ALT, ALKPHOS, BILITOT, PROT, ALBUMIN in the last 168 hours. No results for input(s): LIPASE, AMYLASE in the last 168 hours. No results for input(s): AMMONIA in the last 168 hours. Coagulation Profile: No results for input(s): INR, PROTIME in the last 168 hours. CBC: Recent Labs  Lab 02/13/21 0504  WBC 6.3  HGB 14.3  HCT 42.0  MCV 89.7  PLT 114*   Cardiac Enzymes: Recent Labs  Lab 02/11/21 0614  CKTOTAL 80   BNP: Invalid input(s): POCBNP CBG: No results for input(s): GLUCAP in the last 168 hours. HbA1C: No results for input(s): HGBA1C in the last 72 hours. Urine analysis:    Component Value Date/Time   COLORURINE YELLOW 12/12/2017 [place] 12/12/2017 0931   LABSPEC 1.023 12/12/2017 0931   PHURINE 6.0 12/12/2017 0931   GLUCOSEU NEGATIVE 12/12/2017 0931   HGBUR NEGATIVE 12/12/2017 0931   BILIRUBINUR NEGATIVE 12/12/2017 0931   BILIRUBINUR [place] 10/19/2015 1526   KETONESUR NEGATIVE 12/12/2017 0931   PROTEINUR NEGATIVE 12/12/2017 0931   UROBILINOGEN >=8.0 10/19/2015 1526   UROBILINOGEN 0.2 10/31/2014 1840   NITRITE NEGATIVE 12/12/2017 0931   LEUKOCYTESUR NEGATIVE 12/12/2017 0931   Sepsis Labs: @LABRCNTIP (procalcitonin:4,lacticidven:4) )No results found for this or any previous visit (from the past 240 hour(s)).   Scheduled Meds:  apixaban  5 mg Oral BID   Chlorhexidine Gluconate Cloth  6 each Topical Daily   hydrOXYzine  25 mg Oral BID   lacosamide  150 mg Oral BID   lamoTRIgine  25 mg Oral Daily   levETIRAcetam  1,500 mg Oral BID   melatonin  6 mg Oral QHS    metoprolol succinate  25 mg Oral Daily   nicotine  14 mg Transdermal Daily   QUEtiapine  50 mg Oral BID   And   QUEtiapine  200 mg Oral QHS   sodium chloride flush  10-40 mL Intracatheter Q12H   Continuous Infusions:  DAPTOmycin (CUBICIN)  IV      Procedures/Studies: MR FOOT LEFT W WO CONTRAST  Result Date: 01/21/2021 CLINICAL DATA:  Cellulitis of the left second toe EXAM: MRI OF THE LEFT FOREFOOT WITHOUT AND WITH CONTRAST TECHNIQUE: Multiplanar, multisequence MR imaging of the left forefoot was performed both before and after administration of intravenous contrast. CONTRAST:  10m GADAVIST GADOBUTROL 1 MMOL/ML IV SOLN COMPARISON:  01/20/2021 FINDINGS: Bones/Joint/Cartilage Diffuse edema signal and enhancement in the middle and distal phalanges of the second toe compatible with active osteomyelitis. There is deformity of the distal phalanx of the great toe with scalloping of the tuft but with little in the way of edema. This may be the result of chronic deformity or remote osteomyelitis but we do not demonstrate striking edema or enhancement in the remaining distal phalanx a thing this less likely to be from acute osteomyelitis.  There is some thinning of the overlying soft tissues of the distal toe. Correlate patient history. 5 mm degenerative subcortical cystic lesion or geode in the proximal navicular. Ligaments Lisfranc ligament intact Muscles and Tendons Regional muscular atrophy Soft tissues Subcutaneous edema and enhancement in the second toe compatible with cellulitis. Dorsal subcutaneous edema in the forefoot is not a complete by enhancement and accordingly is not necessarily infectious. IMPRESSION: 1. Active osteomyelitis in the middle and distal phalanges of the second toe with surrounding cellulitis. 2. Deformity of the distal phalanx of the great toe but without abnormal edema or enhancement either in the distal phalanx or in the surrounding soft tissues. Accordingly this is more likely to  represent deformity from an old/chronic process rather than acute osteomyelitis. Electronically Signed   By: Van Clines M.D.   On: 01/21/2021 06:45   US ARTERIAL ABI (SCREENING LOWER EXTREMITY)  Result Date: 01/21/2021 CLINICAL DATA:  Hypertension Current smoker Left second toe cellulitis Left breast pain EXAM: NONINVASIVE PHYSIOLOGIC VASCULAR STUDY OF BILATERAL LOWER EXTREMITIES TECHNIQUE: Evaluation of both lower extremities were performed at rest, including calculation of ankle-brachial indices with single level Doppler, pressure and pulse volume recording. COMPARISON:  None. FINDINGS: Right ABI:  1.10 Left ABI:  1.09 Right Lower Extremity: Monophasic waveform seen in the right posterior tibial and dorsalis pedis arteries. Left Lower Extremity: Monophasic waveform seen in the posterior tibial and dorsalis pedis arteries. 1.0-1.4 Normal IMPRESSION: Although lower extremity ABI values are within normal limits, monophasic waveforms seen at the ankle bilaterally are suspicious for underlying arterial occlusive disease. Consider further evaluation with CT angiography of the abdominal aorta with runoff. Electronically Signed   By: Miachel Roux M.D.   On: 01/21/2021 11:43   DG Foot Complete Left  Result Date: 01/20/2021 CLINICAL DATA:  Second left toe wound EXAM: LEFT FOOT - COMPLETE 3+ VIEW COMPARISON:  None. FINDINGS: Soft tissue swelling of the distal second digit with erosion of the underlying middle and distal phalanx consistent with osteomyelitis. Erosive changes also noted in the distal tip of the distal phalanx of the great toe with overlying soft tissue abnormality suspicious for chronic osteomyelitis. Atherosclerotic changes seen throughout visualized arterial segments. IMPRESSION: Findings suspicious for osteomyelitis of the tip of the second and first toes. Electronically Signed   By: Miachel Roux M.D.   On: 01/20/2021 11:47   [place] EKG SITE RITE  Result Date: 01/21/2021 If Site Rite image  not attached, placement could not be confirmed due to current cardiac rhythm.   Orson Eva, DO  [facility]  If 7PM-7AM, please contact night-coverage [email or web address] Password [email or web address] 02/17/2021, 3:43 PM   LOS: 27 days

## 2021-02-17 NOTE — Plan of Care (Signed)

## 2021-02-17 NOTE — Plan of Care (Signed)

## 2021-02-17 NOTE — TOC Progression Note (Signed)
Transition of Care [facility]) - Progression Note    Patient Details  Name: [name] [name] MRN: [id number] Date of Birth: 1960/05/07  Transition of [facility]) CM/SW Contact  Annice Needy, LCSW Phone Number: 02/17/2021, 1:00 PM  Clinical Narrative:    Nilda Calamity, supervisor with [facility] APS advises that report is being accepted. Patient is a resident of [address] which means [place] will ultimately be the county who assumes guardianship. [name] advises that [facility] APS will initiate contact within 72 hours.    Expected Discharge Plan: Skilled Nursing Facility Barriers to Discharge: SNF Pending Medicaid, SNF Pending bed offer  Expected Discharge Plan and Services Expected Discharge Plan: Skilled Nursing Facility       Living arrangements for the past 2 months: Assisted Living Facility Expected Discharge Date: 01/26/21                                     Social Determinants of Health (SDOH) Interventions    Readmission Risk Interventions Readmission Risk Prevention Plan 01/22/2021  Transportation Screening Complete  HRI or Home Care Consult Complete  Social Work Consult for Recovery Care Planning/Counseling Complete  Palliative Care Screening Not Applicable  Medication Review Oceanographer) Complete  Some recent data might be hidden

## 2021-02-17 NOTE — TOC Progression Note (Signed)
Transition of Care [facility]) - Progression Note    Patient Details  Name: Dmani Mizer MRN: [id number] Date of Birth: 02-28-1961  Transition of Care [facility]) CM/SW Contact  Armanda Heritage, RN Phone Number: 02/17/2021, 1:27 PM  Clinical Narrative:    CM spoke with [facility] rep, patient is under review at facility, [facility] faxed to [facility] for review as well.  VM left for Velna Hatchet with [facility].     Expected Discharge Plan: Skilled Nursing Facility Barriers to Discharge: SNF Pending Medicaid, SNF Pending bed offer  Expected Discharge Plan and Services Expected Discharge Plan: Skilled Nursing Facility       Living arrangements for the past 2 months: Assisted Living Facility Expected Discharge Date: 01/26/21                                     Social Determinants of Health (SDOH) Interventions    Readmission Risk Interventions Readmission Risk Prevention Plan 01/22/2021  Transportation Screening Complete  HRI or Home Care Consult Complete  Social Work Consult for Recovery Care Planning/Counseling Complete  Palliative Care Screening Not Applicable  Medication Review Oceanographer) Complete  Some recent data might be hidden

## 2021-02-17 NOTE — TOC Progression Note (Signed)
Late entry for 02/16/21:Transition of Care [facility]) - Progression Note    Patient Details  Name: [name] [name] MRN: [id number] Date of Birth: 09-06-1960  Transition of Care [facility]) CM/SW Contact  Annice Needy, LCSW Phone Number: 02/17/2021, 10:33 AM  Clinical Narrative:    Sherron Monday with Synetta Fail with [facility] and advised that patient had completed his IV abx. Inquired as to whether he would be able to return since the IV abx was complete. Synetta Fail stated that she cannot accept patient back because he needs a higher level of care. When questioned about what the higher level of care needs are, she indicated that patient was already confused, thinking he was in the in a hospital while at the facility. She indicated if she allowed patient to return he would be more confused.  Referrals sent to multiple SNFs for LTC.  02/17/21: Referral made with [facility] regarding guardianship.   Expected Discharge Plan: Skilled Nursing Facility Barriers to Discharge: SNF Pending Medicaid, SNF Pending bed offer  Expected Discharge Plan and Services Expected Discharge Plan: Skilled Nursing Facility       Living arrangements for the past 2 months: Assisted Living Facility Expected Discharge Date: 01/26/21                                     Social Determinants of Health (SDOH) Interventions    Readmission Risk Interventions Readmission Risk Prevention Plan 01/22/2021  Transportation Screening Complete  HRI or Home Care Consult Complete  Social Work Consult for Recovery Care Planning/Counseling Complete  Palliative Care Screening Not Applicable  Medication Review Oceanographer) Complete  Some recent data might be hidden

## 2021-02-18 DIAGNOSIS — I739 Peripheral vascular disease, unspecified: Secondary | ICD-10-CM

## 2021-02-18 LAB — CBC
HCT: 43 % (ref 39.0–52.0)
Hemoglobin: 14.3 g/dL (ref 13.0–17.0)
MCH: 30.3 pg (ref 26.0–34.0)
MCHC: 33.3 g/dL (ref 30.0–36.0)
MCV: 91.1 fL (ref 80.0–100.0)
Platelets: 129 10*3/uL — ABNORMAL LOW (ref 150–400)
RBC: 4.72 MIL/uL (ref 4.22–5.81)
RDW: 17.5 % — ABNORMAL HIGH (ref 11.5–15.5)
WBC: 7.1 10*3/uL (ref 4.0–10.5)
nRBC: 0 % (ref 0.0–0.2)

## 2021-02-18 LAB — BASIC METABOLIC PANEL
Anion gap: 7 (ref 5–15)
BUN: 11 mg/dL (ref 8–23)
CO2: 26 mmol/L (ref 22–32)
Calcium: 8.5 mg/dL — ABNORMAL LOW (ref 8.9–10.3)
Chloride: 104 mmol/L (ref 98–111)
Creatinine, Ser: 0.8 mg/dL (ref 0.61–1.24)
GFR, Estimated: >60 mL/min (ref >60)
Glucose, Bld: 136 mg/dL — ABNORMAL HIGH (ref 70–99)
Potassium: 4 mmol/L (ref 3.5–5.1)
Sodium: 137 mmol/L (ref 135–145)

## 2021-02-18 LAB — SEDIMENTATION RATE: Sed Rate: 5 mm/hr (ref 0–16)

## 2021-02-18 LAB — CK: Total CK: 82 U/L (ref 49–397)

## 2021-02-18 LAB — C-REACTIVE PROTEIN: CRP: 0.6 mg/dL (ref <1.0)

## 2021-02-18 MED ORDER — DOXYCYCLINE HYCLATE 100 MG PO TABS
100.0000 mg | ORAL_TABLET | Freq: Two times a day (BID) | ORAL | Status: DC
  Administered 2021-02-18 – 2021-02-28 (×20): 100 mg via ORAL
  Filled 2021-02-18 (×20): qty 1

## 2021-02-18 MED ORDER — CEFDINIR 300 MG PO CAPS
300.0000 mg | ORAL_CAPSULE | Freq: Two times a day (BID) | ORAL | Status: DC
  Administered 2021-02-18 – 2021-02-28 (×19): 300 mg via ORAL
  Filled 2021-02-18 (×20): qty 1

## 2021-02-18 NOTE — Plan of Care (Signed)
  Problem: Education: Goal: Knowledge of General Education information will improve Description: Including pain rating scale, medication(s)/side effects and non-pharmacologic comfort measures 02/18/2021 1454 by Genevie Ann, RN Outcome: Progressing 02/18/2021 1446 by Genevie Ann, RN Outcome: Progressing   Problem: Health Behavior/Discharge Planning: Goal: Ability to manage health-related needs will improve 02/18/2021 1454 by Genevie Ann, RN Outcome: Progressing 02/18/2021 1446 by Genevie Ann, RN Outcome: Progressing

## 2021-02-18 NOTE — Progress Notes (Signed)
PROGRESS NOTE  [name] [name] EVO:[id number] DOB: 07-24-1960 DOA: 01/20/2021 PCP: [name], NP   Brief History:   60 y.o. male with medical history of MDD with psychosis, seizure disorder, hypertension, tobacco abuse, cognitive impairment, atrial flutter with RVR, chronic hepatitis C, history of cocaine and alcohol abuse presenting due to abnormality with his left second toe.  The patient is a poor historian.  When asked why he is in the emergency department, he states " they tell me there is something wrong with my foot".  Apparently, the patient was getting his toenails trimmed at his assisted living facility [facility]) when it was noticed there was some abnormality and discoloration of his left second toe.  As result, the patient was sent to the emergency department for further evaluation.  The patient himself denies any pain or discomfort about his second toe.  He is unable to provide any history regarding recent injury or trauma.  He states, " I did not there was something wrong with my toe."  He denies any fevers, chills, chest pain, shortness breath, cough, hemoptysis, nausea, vomiting, diarrhea, domino pain, foot pain.  He continues to smoke tobacco.   ED In the emergency department, the patient was afebrile hemodynamically stable with oxygen saturation 100% room air.  BMP showed a sodium 141, potassium 3.9, serum creatinine 1.04.  WBC 7.1, hemoglobin 14.9, platelets 124,000.  Patient was given a dose of vancomycin.  X-ray of the left foot showed soft tissue swelling with erosion of the distal and middle phalanx of the left second toe.  Admission was requested for further work-up and treatment.     Assessment/Plan: Cellulitis left second toe/Acute osteomyelitis -ESR--5>>5 -CRP--0.7>>0.6 -Check ABIs--left = 1.10; right = 1.09 -Patient has palpable dorsalis pedis pulses bilateral -Surgery consult noted>>nonoperative management -Continue vancomycin>>changed to  daptomycin -changed to ceftriaxone -MR left foot--acute osteomyelitis middle and distal phalanges 2nd toe -PICC line placed 11/3 -plan 4 weeks IV abx--last day 02/17/21 -Check BMP, CBC, CRP, ESR -plan to d/c with 1 more week po abx     Bipolar disorder -Continue quetiapine, sertraline, trazodone -Continue hydroxyzine -stable  Atrial flutter -Continue apixaban -Continue metoprolol succinate -hold diltiazem due to bradycardia   Seizure disorder -Continue lamotrigine, Keppra, Vimpat   Tobacco abuse -Tobacco cessation discussed   Thrombocytopenia -Likely secondary to chronic hepatitis C -stable -Monitor for signs of bleeding   Chronic hepatitis C -Appears clinically compensated                       Family Communication:   no Family at bedside   Consultants:  general surgery   Code Status:  FULL    DVT Prophylaxis:  apixaban     Procedures: As Listed in Progress Note Above   Antibiotics: Ceftriaxone Daptomycin -doxy 12/1>> -cefdinir 12/1>>        Subjective: Patient denies fevers, chills, headache, chest pain, dyspnea, nausea, vomiting, diarrhea, abdominal pain, dysuria, hematuria, hematochezia, and melena.   Objective: Vitals:   02/17/21 1300 02/17/21 2040 02/18/21 0501 02/18/21 1451  BP: 132/68 (!) 129/91 126/65 (!) 127/94  Pulse: 72 67 60 64  Resp: 19 19 20    Temp: 97.9 F (36.6 C) 98 F (36.7 C) 97.8 F (36.6 C)   TempSrc: Oral     SpO2: 96% 97% 97% 96%  Weight:      Height:        Intake/Output Summary (Last 24 hours) at 02/18/2021 1729  Last data filed at 02/18/2021 1200 Gross per 24 hour  Intake 1540 ml  Output 700 ml  Net 840 ml   Weight change:  Exam:  General:  Pt is alert, follows commands appropriately, not in acute distress HEENT: No icterus, No thrush, No neck mass, [place]/AT Cardiovascular: RRR, S1/S2, no rubs, no gallops Respiratory: CTA bilaterally, no wheezing, no crackles, no rhonchi Abdomen: Soft/+BS, non  tender, non distended, no guarding Extremities: No edema, No lymphangitis, No petechiae, No rashes, no synovitis   Data Reviewed: I have personally reviewed following labs and imaging studies Basic Metabolic Panel: Recent Labs  Lab 02/18/21 0452  NA 137  K 4.0  CL 104  CO2 26  GLUCOSE 136*  BUN 11  CREATININE 0.80  CALCIUM 8.5*   Liver Function Tests: No results for input(s): AST, ALT, ALKPHOS, BILITOT, PROT, ALBUMIN in the last 168 hours. No results for input(s): LIPASE, AMYLASE in the last 168 hours. No results for input(s): AMMONIA in the last 168 hours. Coagulation Profile: No results for input(s): INR, PROTIME in the last 168 hours. CBC: Recent Labs  Lab 02/13/21 0504 02/18/21 0452  WBC 6.3 7.1  HGB 14.3 14.3  HCT 42.0 43.0  MCV 89.7 91.1  PLT 114* 129*   Cardiac Enzymes: Recent Labs  Lab 02/18/21 0452  CKTOTAL 82   BNP: Invalid input(s): POCBNP CBG: No results for input(s): GLUCAP in the last 168 hours. HbA1C: No results for input(s): HGBA1C in the last 72 hours. Urine analysis:    Component Value Date/Time   COLORURINE YELLOW 12/12/2017 [place] 12/12/2017 0931   LABSPEC 1.023 12/12/2017 0931   PHURINE 6.0 12/12/2017 0931   GLUCOSEU NEGATIVE 12/12/2017 0931   HGBUR NEGATIVE 12/12/2017 0931   BILIRUBINUR NEGATIVE 12/12/2017 0931   BILIRUBINUR [place] 10/19/2015 1526   KETONESUR NEGATIVE 12/12/2017 0931   PROTEINUR NEGATIVE 12/12/2017 0931   UROBILINOGEN >=8.0 10/19/2015 1526   UROBILINOGEN 0.2 10/31/2014 1840   NITRITE NEGATIVE 12/12/2017 0931   LEUKOCYTESUR NEGATIVE 12/12/2017 0931   Sepsis Labs: @LABRCNTIP (procalcitonin:4,lacticidven:4) )No results found for this or any previous visit (from the past 240 hour(s)).   Scheduled Meds:  apixaban  5 mg Oral BID   cefdinir  300 mg Oral Q12H   Chlorhexidine Gluconate Cloth  6 each Topical Daily   doxycycline  100 mg Oral Q12H   hydrOXYzine  25 mg Oral BID   lacosamide  150 mg  Oral BID   lamoTRIgine  25 mg Oral Daily   levETIRAcetam  1,500 mg Oral BID   melatonin  6 mg Oral QHS   metoprolol succinate  25 mg Oral Daily   nicotine  14 mg Transdermal Daily   QUEtiapine  50 mg Oral BID   And   QUEtiapine  200 mg Oral QHS   sodium chloride flush  10-40 mL Intracatheter Q12H   Continuous Infusions:  Procedures/Studies: MR FOOT LEFT W WO CONTRAST  Result Date: 01/21/2021 CLINICAL DATA:  Cellulitis of the left second toe EXAM: MRI OF THE LEFT FOREFOOT WITHOUT AND WITH CONTRAST TECHNIQUE: Multiplanar, multisequence MR imaging of the left forefoot was performed both before and after administration of intravenous contrast. CONTRAST:  64m GADAVIST GADOBUTROL 1 MMOL/ML IV SOLN COMPARISON:  01/20/2021 FINDINGS: Bones/Joint/Cartilage Diffuse edema signal and enhancement in the middle and distal phalanges of the second toe compatible with active osteomyelitis. There is deformity of the distal phalanx of the great toe with scalloping of the tuft but with little in the way  of edema. This may be the result of chronic deformity or remote osteomyelitis but we do not demonstrate striking edema or enhancement in the remaining distal phalanx a thing this less likely to be from acute osteomyelitis. There is some thinning of the overlying soft tissues of the distal toe. Correlate patient history. 5 mm degenerative subcortical cystic lesion or geode in the proximal navicular. Ligaments Lisfranc ligament intact Muscles and Tendons Regional muscular atrophy Soft tissues Subcutaneous edema and enhancement in the second toe compatible with cellulitis. Dorsal subcutaneous edema in the forefoot is not a complete by enhancement and accordingly is not necessarily infectious. IMPRESSION: 1. Active osteomyelitis in the middle and distal phalanges of the second toe with surrounding cellulitis. 2. Deformity of the distal phalanx of the great toe but without abnormal edema or enhancement either in the distal  phalanx or in the surrounding soft tissues. Accordingly this is more likely to represent deformity from an old/chronic process rather than acute osteomyelitis. Electronically Signed   By: Van Clines M.D.   On: 01/21/2021 06:45   US ARTERIAL ABI (SCREENING LOWER EXTREMITY)  Result Date: 01/21/2021 CLINICAL DATA:  Hypertension Current smoker Left second toe cellulitis Left breast pain EXAM: NONINVASIVE PHYSIOLOGIC VASCULAR STUDY OF BILATERAL LOWER EXTREMITIES TECHNIQUE: Evaluation of both lower extremities were performed at rest, including calculation of ankle-brachial indices with single level Doppler, pressure and pulse volume recording. COMPARISON:  None. FINDINGS: Right ABI:  1.10 Left ABI:  1.09 Right Lower Extremity: Monophasic waveform seen in the right posterior tibial and dorsalis pedis arteries. Left Lower Extremity: Monophasic waveform seen in the posterior tibial and dorsalis pedis arteries. 1.0-1.4 Normal IMPRESSION: Although lower extremity ABI values are within normal limits, monophasic waveforms seen at the ankle bilaterally are suspicious for underlying arterial occlusive disease. Consider further evaluation with CT angiography of the abdominal aorta with runoff. Electronically Signed   By: Miachel Roux M.D.   On: 01/21/2021 11:43   DG Foot Complete Left  Result Date: 01/20/2021 CLINICAL DATA:  Second left toe wound EXAM: LEFT FOOT - COMPLETE 3+ VIEW COMPARISON:  None. FINDINGS: Soft tissue swelling of the distal second digit with erosion of the underlying middle and distal phalanx consistent with osteomyelitis. Erosive changes also noted in the distal tip of the distal phalanx of the great toe with overlying soft tissue abnormality suspicious for chronic osteomyelitis. Atherosclerotic changes seen throughout visualized arterial segments. IMPRESSION: Findings suspicious for osteomyelitis of the tip of the second and first toes. Electronically Signed   By: Miachel Roux M.D.   On:  01/20/2021 11:47   [place] EKG SITE RITE  Result Date: 01/21/2021 If Site Rite image not attached, placement could not be confirmed due to current cardiac rhythm.   Orson Eva, DO  [facility]  If 7PM-7AM, please contact night-coverage [email or web address] Password [email or web address] 02/18/2021, 5:29 PM   LOS: 28 days

## 2021-02-18 NOTE — Plan of Care (Signed)
  Problem: Education: Goal: Knowledge of General Education information will improve Description Including pain rating scale, medication(s)/side effects and non-pharmacologic comfort measures Outcome: Progressing   Problem: Health Behavior/Discharge Planning: Goal: Ability to manage health-related needs will improve Outcome: Progressing   

## 2021-02-19 NOTE — Plan of Care (Signed)

## 2021-02-19 NOTE — Progress Notes (Signed)
PROGRESS NOTE  [name] [name] PJK:[id number] DOB: [birth year] DOA: 01/20/2021 PCP: [name], NP  Brief History:   60 y.o. male with medical history of MDD with psychosis, seizure disorder, hypertension, tobacco abuse, cognitive impairment, atrial flutter with RVR, chronic hepatitis C, history of cocaine and alcohol abuse presenting due to abnormality with his left second toe.  The patient is a poor historian.  When asked why he is in the emergency department, he states " they tell me there is something wrong with my foot".  Apparently, the patient was getting his toenails trimmed at his assisted living facility [facility]) when it was noticed there was some abnormality and discoloration of his left second toe.  As result, the patient was sent to the emergency department for further evaluation.  The patient himself denies any pain or discomfort about his second toe.  He is unable to provide any history regarding recent injury or trauma.  He states, " I did not there was something wrong with my toe."  He denies any fevers, chills, chest pain, shortness breath, cough, hemoptysis, nausea, vomiting, diarrhea, domino pain, foot pain.  He continues to smoke tobacco.   ED In the emergency department, the patient was afebrile hemodynamically stable with oxygen saturation 100% room air.  BMP showed a sodium 141, potassium 3.9, serum creatinine 1.04.  WBC 7.1, hemoglobin 14.9, platelets 124,000.  Patient was given a dose of vancomycin.  X-ray of the left foot showed soft tissue swelling with erosion of the distal and middle phalanx of the left second toe.  Admission was requested for further work-up and treatment.     Assessment/Plan: Cellulitis left second toe/Acute osteomyelitis -ESR--5>>5 -CRP--0.7>>0.6 -Check ABIs--left = 1.10; right = 1.09 -Patient has palpable dorsalis pedis pulses bilateral -Surgery consult noted>>nonoperative management -Continue vancomycin>>changed to  daptomycin -changed to ceftriaxone -MR left foot--acute osteomyelitis middle and distal phalanges 2nd toe -PICC line placed 11/3 -plan 4 weeks IV abx--last day 02/17/21 -Check BMP, CBC, CRP, ESR -cefdinir and doxy x 7 more days   Bipolar disorder -Continue quetiapine, sertraline, trazodone -Continue hydroxyzine -stable  Atrial flutter -Continue apixaban -Continue metoprolol succinate -hold diltiazem due to bradycardia   Seizure disorder -Continue lamotrigine, Keppra, Vimpat   Tobacco abuse -Tobacco cessation discussed   Thrombocytopenia -Likely secondary to chronic hepatitis C -stable -Monitor for signs of bleeding   Chronic hepatitis C -Appears clinically compensated                       Family Communication:   no Family at bedside   Consultants:  general surgery   Code Status:  FULL    DVT Prophylaxis:  apixaban     Procedures: As Listed in Progress Note Above   Antibiotics: Ceftriaxone Daptomycin -doxy 12/1>> -cefdinir 12/1>>    Subjective: Patient denies fevers, chills, headache, chest pain, dyspnea, nausea, vomiting, diarrhea, abdominal pain, dysuria, hematuria, hematochezia, and melena.   Objective: Vitals:   02/18/21 2149 02/19/21 0704 02/19/21 1256 02/19/21 1534  BP: (!) 162/75 100/72 109/85 125/86  Pulse: 74 60 90 81  Resp: _0 Temp: 98.8 F (37.1 C) 98.2 F (36.8 C) 98.2 F (36.8 C)   TempSrc: Oral Oral    SpO2: 97% 98% 100%   Weight:      Height:        Intake/Output Summary (Last 24 hours) at 02/19/2021 1738 Last data filed at 02/19/2021 1549 Gross per 24 hour  Intake 1084 ml  Output 1000 ml  Net 84 ml   Weight change:  Exam:  General:  Pt is alert, follows commands appropriately, not in acute distress HEENT: No icterus, No thrush, No neck mass, [place]/AT Cardiovascular: RRR, S1/S2, no rubs, no gallops Respiratory: CTA bilaterally, no wheezing, no crackles, no rhonchi Abdomen: Soft/+BS, non tender, non  distended, no guarding Extremities: No edema, No lymphangitis, No petechiae, No rashes, no synovitis   Data Reviewed: I have personally reviewed following labs and imaging studies Basic Metabolic Panel: Recent Labs  Lab 02/18/21 0452  NA 137  K 4.0  CL 104  CO2 26  GLUCOSE 136*  BUN 11  CREATININE 0.80  CALCIUM 8.5*   Liver Function Tests: No results for input(s): AST, ALT, ALKPHOS, BILITOT, PROT, ALBUMIN in the last 168 hours. No results for input(s): LIPASE, AMYLASE in the last 168 hours. No results for input(s): AMMONIA in the last 168 hours. Coagulation Profile: No results for input(s): INR, PROTIME in the last 168 hours. CBC: Recent Labs  Lab 02/13/21 0504 02/18/21 0452  WBC 6.3 7.1  HGB 14.3 14.3  HCT 42.0 43.0  MCV 89.7 91.1  PLT 114* 129*   Cardiac Enzymes: Recent Labs  Lab 02/18/21 0452  CKTOTAL 82   BNP: Invalid input(s): POCBNP CBG: No results for input(s): GLUCAP in the last 168 hours. HbA1C: No results for input(s): HGBA1C in the last 72 hours. Urine analysis:    Component Value Date/Time   COLORURINE YELLOW 12/12/2017 [place] 12/12/2017 0931   LABSPEC 1.023 12/12/2017 0931   PHURINE 6.0 12/12/2017 0931   GLUCOSEU NEGATIVE 12/12/2017 0931   HGBUR NEGATIVE 12/12/2017 0931   BILIRUBINUR NEGATIVE 12/12/2017 0931   BILIRUBINUR [place] 10/19/2015 1526   KETONESUR NEGATIVE 12/12/2017 0931   PROTEINUR NEGATIVE 12/12/2017 0931   UROBILINOGEN >=8.0 10/19/2015 1526   UROBILINOGEN 0.2 10/31/2014 1840   NITRITE NEGATIVE 12/12/2017 0931   LEUKOCYTESUR NEGATIVE 12/12/2017 0931   Sepsis Labs: _0 (procalcitonin:4,lacticidven:4) )No results found for this or any previous visit (from the past 240 hour(s)).   Scheduled Meds:  apixaban  5 mg Oral BID   cefdinir  300 mg Oral Q12H   Chlorhexidine Gluconate Cloth  6 each Topical Daily   doxycycline  100 mg Oral Q12H   hydrOXYzine  25 mg Oral BID   lacosamide  150 mg Oral BID    lamoTRIgine  25 mg Oral Daily   levETIRAcetam  1,500 mg Oral BID   melatonin  6 mg Oral QHS   metoprolol succinate  25 mg Oral Daily   nicotine  14 mg Transdermal Daily   QUEtiapine  50 mg Oral BID   And   QUEtiapine  200 mg Oral QHS   sodium chloride flush  10-40 mL Intracatheter Q12H   Continuous Infusions:  Procedures/Studies: US ARTERIAL ABI (SCREENING LOWER EXTREMITY)  Result Date: 01/21/2021 CLINICAL DATA:  Hypertension Current smoker Left second toe cellulitis Left breast pain EXAM: NONINVASIVE PHYSIOLOGIC VASCULAR STUDY OF BILATERAL LOWER EXTREMITIES TECHNIQUE: Evaluation of both lower extremities were performed at rest, including calculation of ankle-brachial indices with single level Doppler, pressure and pulse volume recording. COMPARISON:  None. FINDINGS: Right ABI:  1.10 Left ABI:  1.09 Right Lower Extremity: Monophasic waveform seen in the right posterior tibial and dorsalis pedis arteries. Left Lower Extremity: Monophasic waveform seen in the posterior tibial and dorsalis pedis arteries. 1.0-1.4 Normal IMPRESSION: Although lower extremity ABI values are within normal limits, monophasic waveforms seen at the  ankle bilaterally are suspicious for underlying arterial occlusive disease. Consider further evaluation with CT angiography of the abdominal aorta with runoff. Electronically Signed   By: Miachel Roux M.D.   On: 01/21/2021 11:43   [place] EKG SITE RITE  Result Date: 01/21/2021 If Site Rite image not attached, placement could not be confirmed due to current cardiac rhythm.   Orson Eva, DO  [facility]  If 7PM-7AM, please contact night-coverage [email or web address] Password [facility] 02/19/2021, 5:38 PM   LOS: 29 days

## 2021-02-19 NOTE — TOC Progression Note (Signed)
Transition of Care [facility]) - Progression Note    Patient Details  Name: [name] [name] MRN: [id number] Date of Birth: 09-Aug-1960  Transition of [facility]) CM/SW Contact  Barry Brunner, [place] Phone Number: 02/19/2021, 1:43 PM  Clinical Narrative:    [name] agreeable to take patient Monday 12/5. [name] reported that they will require a Covid test within 24 hours prior to admission to facility.    Expected Discharge Plan: Skilled Nursing Facility Barriers to Discharge: SNF Pending Medicaid, SNF Pending bed offer  Expected Discharge Plan and Services Expected Discharge Plan: Skilled Nursing Facility       Living arrangements for the past 2 months: Assisted Living Facility Expected Discharge Date: 01/26/21                                     Social Determinants of Health (SDOH) Interventions    Readmission Risk Interventions Readmission Risk Prevention Plan 01/22/2021  Transportation Screening Complete  HRI or Home Care Consult Complete  Social Work Consult for Recovery Care Planning/Counseling Complete  Palliative Care Screening Not Applicable  Medication Review Oceanographer) Complete  Some recent data might be hidden

## 2021-02-19 NOTE — Plan of Care (Signed)
  Problem: Health Behavior/Discharge Planning: Goal: Ability to manage health-related needs will improve Outcome: Not Progressing   Problem: Coping: Goal: Level of anxiety will decrease Outcome: Not Progressing   Problem: Safety: Goal: Ability to remain free from injury will improve Outcome: Not Progressing   Problem: Skin Integrity: Goal: Risk for impaired skin integrity will decrease Outcome: Not Progressing

## 2021-02-20 NOTE — Progress Notes (Signed)
PROGRESS NOTE  [name] [name] FYT:[id number] DOB: 08-10-1960 DOA: 01/20/2021 PCP: [name], NP   Brief History:   60 y.o. male with medical history of MDD with psychosis, seizure disorder, hypertension, tobacco abuse, cognitive impairment, atrial flutter with RVR, chronic hepatitis C, history of cocaine and alcohol abuse presenting due to abnormality with his left second toe.  The patient is a poor historian.  When asked why he is in the emergency department, he states " they tell me there is something wrong with my foot".  Apparently, the patient was getting his toenails trimmed at his assisted living facility [facility]) when it was noticed there was some abnormality and discoloration of his left second toe.  As result, the patient was sent to the emergency department for further evaluation.  The patient himself denies any pain or discomfort about his second toe.  He is unable to provide any history regarding recent injury or trauma.  He states, " I did not there was something wrong with my toe."  He denies any fevers, chills, chest pain, shortness breath, cough, hemoptysis, nausea, vomiting, diarrhea, domino pain, foot pain.  He continues to smoke tobacco.   ED In the emergency department, the patient was afebrile hemodynamically stable with oxygen saturation 100% room air.  BMP showed a sodium 141, potassium 3.9, serum creatinine 1.04.  WBC 7.1, hemoglobin 14.9, platelets 124,000.  Patient was given a dose of vancomycin.  X-ray of the left foot showed soft tissue swelling with erosion of the distal and middle phalanx of the left second toe.  Admission was requested for further work-up and treatment.     Assessment/Plan: Cellulitis left second toe/Acute osteomyelitis -ESR--5>>5 -CRP--0.7>>0.6 -Check ABIs--left = 1.10; right = 1.09 -Patient has palpable dorsalis pedis pulses bilateral -Surgery consult noted>>nonoperative management -Continue vancomycin>>changed to  daptomycin -changed to ceftriaxone -MR left foot--acute osteomyelitis middle and distal phalanges 2nd toe -PICC line placed 11/3 -plan 4 weeks IV abx--last day 02/17/21 -Check BMP, CBC, CRP, ESR -cefdinir and doxy x 6 more days   Bipolar disorder -Continue quetiapine, sertraline, trazodone -Continue hydroxyzine -stable  Atrial flutter -Continue apixaban -Continue metoprolol succinate -hold diltiazem due to bradycardia   Seizure disorder -Continue lamotrigine, Keppra, Vimpat   Tobacco abuse -Tobacco cessation discussed   Thrombocytopenia -Likely secondary to chronic hepatitis C -stable -Monitor for signs of bleeding   Chronic hepatitis C -Appears clinically compensated                       Family Communication:   no Family at bedside   Consultants:  general surgery   Code Status:  FULL    DVT Prophylaxis:  apixaban     Procedures: As Listed in Progress Note Above   Antibiotics: Ceftriaxone Daptomycin -doxy 12/1>> -cefdinir 12/1>>      Subjective: Patient denies fevers, chills, headache, chest pain, dyspnea, nausea, vomiting, diarrhea, abdominal pain, dysuria, hematuria, hematochezia, and melena.   Objective: Vitals:   02/19/21 2202 02/20/21 0449 02/20/21 0949 02/20/21 1338  BP: (!) 136/93 117/81 128/74 (!) 155/73  Pulse: (!) 41 79 75 71  Resp: 18 18  16   Temp: 98.2 F (36.8 C) (!) 97.4 F (36.3 C)  98.1 F (36.7 C)  TempSrc: Oral Oral    SpO2: 97% 97%  94%  Weight:      Height:        Intake/Output Summary (Last 24 hours) at 02/20/2021 1630 Last data filed at  02/20/2021 0900 Gross per 24 hour  Intake 1560 ml  Output 1900 ml  Net -340 ml   Weight change:  Exam:  General:  Pt is alert, follows commands appropriately, not in acute distress HEENT: No icterus, No thrush, No neck mass, [place]/AT Cardiovascular: RRR, S1/S2, no rubs, no gallops Respiratory: CTA bilaterally, no wheezing, no crackles, no rhonchi Abdomen: Soft/+BS, non  tender, non distended, no guarding Extremities: No edema, No lymphangitis, No petechiae, No rashes, no synovitis   Data Reviewed: I have personally reviewed following labs and imaging studies Basic Metabolic Panel: Recent Labs  Lab 02/18/21 0452  NA 137  K 4.0  CL 104  CO2 26  GLUCOSE 136*  BUN 11  CREATININE 0.80  CALCIUM 8.5*   Liver Function Tests: No results for input(s): AST, ALT, ALKPHOS, BILITOT, PROT, ALBUMIN in the last 168 hours. No results for input(s): LIPASE, AMYLASE in the last 168 hours. No results for input(s): AMMONIA in the last 168 hours. Coagulation Profile: No results for input(s): INR, PROTIME in the last 168 hours. CBC: Recent Labs  Lab 02/18/21 0452  WBC 7.1  HGB 14.3  HCT 43.0  MCV 91.1  PLT 129*   Cardiac Enzymes: Recent Labs  Lab 02/18/21 0452  CKTOTAL 82   BNP: Invalid input(s): POCBNP CBG: No results for input(s): GLUCAP in the last 168 hours. HbA1C: No results for input(s): HGBA1C in the last 72 hours. Urine analysis:    Component Value Date/Time   COLORURINE YELLOW 12/12/2017 [place] 12/12/2017 0931   LABSPEC 1.023 12/12/2017 0931   PHURINE 6.0 12/12/2017 0931   GLUCOSEU NEGATIVE 12/12/2017 0931   HGBUR NEGATIVE 12/12/2017 0931   BILIRUBINUR NEGATIVE 12/12/2017 0931   BILIRUBINUR Small 10/19/2015 1526   KETONESUR NEGATIVE 12/12/2017 0931   PROTEINUR NEGATIVE 12/12/2017 0931   UROBILINOGEN >=8.0 10/19/2015 1526   UROBILINOGEN 0.2 10/31/2014 1840   NITRITE NEGATIVE 12/12/2017 0931   LEUKOCYTESUR NEGATIVE 12/12/2017 0931   Sepsis Labs: @LABRCNTIP (procalcitonin:4,lacticidven:4) )No results found for this or any previous visit (from the past 240 hour(s)).   Scheduled Meds:  apixaban  5 mg Oral BID   cefdinir  300 mg Oral Q12H   Chlorhexidine Gluconate Cloth  6 each Topical Daily   doxycycline  100 mg Oral Q12H   hydrOXYzine  25 mg Oral BID   lacosamide  150 mg Oral BID   lamoTRIgine  25 mg Oral  Daily   levETIRAcetam  1,500 mg Oral BID   melatonin  6 mg Oral QHS   metoprolol succinate  25 mg Oral Daily   nicotine  14 mg Transdermal Daily   QUEtiapine  50 mg Oral BID   And   QUEtiapine  200 mg Oral QHS   sodium chloride flush  10-40 mL Intracatheter Q12H   Continuous Infusions:  Procedures/Studies: No results found.  Orson Eva, DO  [facility]  If 7PM-7AM, please contact night-coverage [email or web address] Password [email or web address] 02/20/2021, 4:30 PM   LOS: 30 days

## 2021-02-21 LAB — RESP PANEL BY RT-PCR (FLU A&B, COVID) ARPGX2
Influenza A by PCR: NEGATIVE
Influenza B by PCR: NEGATIVE
SARS Coronavirus 2 by RT PCR: NEGATIVE

## 2021-02-21 NOTE — Discharge Summary (Signed)
Physician Discharge Summary  [name] [name] ULA:[id number] DOB: [birth year] DOA: 01/20/2021  PCP: [name], NP  Admit date: 01/20/2021 Discharge date: 02/22/2021  Admitted From: [name] Disposition:  SNF  Recommendations for Outpatient Follow-up:  Follow up with PCP in 1-2 weeks Please obtain BMP/CBC in one week     Discharge Condition: Stable CODE STATUS:FULL Diet recommendation: Regular   Brief/Interim Summary:  60 y.o. male with medical history of MDD with psychosis, seizure disorder, hypertension, tobacco abuse, cognitive impairment, atrial flutter with RVR, chronic hepatitis C, history of cocaine and alcohol abuse presenting due to abnormality with his left second toe.  The patient is a poor historian.  When asked why he is in the emergency department, he states " they tell me there is something wrong with my foot".  Apparently, the patient was getting his toenails trimmed at his assisted living facility [facility]) when it was noticed there was some abnormality and discoloration of his left second toe.  As result, the patient was sent to the emergency department for further evaluation.  The patient himself denies any pain or discomfort about his second toe.  He is unable to provide any history regarding recent injury or trauma.  He states, " I did not there was something wrong with my toe."  He denies any fevers, chills, chest pain, shortness breath, cough, hemoptysis, nausea, vomiting, diarrhea, domino pain, foot pain.  He continues to smoke tobacco.   ED In the emergency department, the patient was afebrile hemodynamically stable with oxygen saturation 100% room air.  BMP showed a sodium 141, potassium 3.9, serum creatinine 1.04.  WBC 7.1, hemoglobin 14.9, platelets 124,000.  Patient was given a dose of vancomycin.  X-ray of the left foot showed soft tissue swelling with erosion of the distal and middle phalanx of the left second toe.  Admission was requested for further work-up and  treatment.  -MR left foot--acute osteomyelitis middle and distal phalanges 2nd toe.  General surgery recommended nonoperative management.  A PICC line was placed and the patient receive 4 weeks of IV cubicin and ceftriaxone.  His toe gradually improved.  His IV abx will be followed with 1 week po abx.  His disposition was difficult as his [name] was not willing to take him back.  Ultimately, he finished his IV antibiotics in the hospital.  During his month in the hospital, he did not have any major behavioral issues or aggressive behavior.  He was continued on his usual baseline bipolar meds without complications.  Discharge Diagnoses:  Cellulitis left second toe/Acute osteomyelitis -ESR--5>>5 -CRP--0.7>>0.6 -Check ABIs--left = 1.10; right = 1.09 -Patient has palpable dorsalis pedis pulses bilateral -Surgery consult noted>>nonoperative management -Continue vancomycin>>changed to daptomycin -changed to ceftriaxone -MR left foot--acute osteomyelitis middle and distal phalanges 2nd toe -PICC line placed 11/3>>removed prior to discharge -plan 4 weeks IV abx--last day 02/17/21 -Check BMP, CBC, CRP, ESR -cefdinir and doxy x 5 more days   Bipolar disorder -Continue quetiapine, sertraline, trazodone -Continue hydroxyzine -stable  Atrial flutter -Continue apixaban -Continue metoprolol succinate -hold diltiazem due to bradycardia   Seizure disorder -Continue lamotrigine, Keppra, Vimpat   Tobacco abuse -Tobacco cessation discussed   Thrombocytopenia -Likely secondary to chronic hepatitis C -stable -Monitor for signs of bleeding   Chronic hepatitis C -Appears clinically compensated Discharge Instructions     Allergies  Allergen Reactions   Carbamazepine Other (See Comments)    Hyponatremia   Depakote [Divalproex Sodium] Other (See Comments)    Unknown - on [facility]  Consultations: none   Procedures/Studies: No results found.      Discharge Exam: Vitals:   02/21/21  0959 02/21/21 1429  BP: 128/89 127/68  Pulse: 62 65  Resp:  20  Temp:  97.6 F (36.4 C)  SpO2:  95%   Vitals:   02/20/21 2054 02/21/21 0557 02/21/21 0959 02/21/21 1429  BP: (!) 153/86 121/73 128/89 127/68  Pulse: 77 95 62 65  Resp: _0 Temp: 97.8 F (36.6 C) 97.7 F (36.5 C)  97.6 F (36.4 C)  TempSrc:      SpO2: 97% 95%  95%  Weight:      Height:        General: Pt is alert, awake, not in acute distress Cardiovascular: RRR, S1/S2 +, no rubs, no gallops Respiratory: CTA bilaterally, no wheezing, no rhonchi Abdominal: Soft, NT, ND, bowel sounds + Extremities: no edema, no cyanosis   The results of significant diagnostics from this hospitalization (including imaging, microbiology, ancillary and laboratory) are listed below for reference.    Significant Diagnostic Studies: No results found.  Microbiology: No results found for this or any previous visit (from the past 240 hour(s)).   Labs: Basic Metabolic Panel: Recent Labs  Lab 02/18/21 0452  NA 137  K 4.0  CL 104  CO2 26  GLUCOSE 136*  BUN 11  CREATININE 0.80  CALCIUM 8.5*   Liver Function Tests: No results for input(s): AST, ALT, ALKPHOS, BILITOT, PROT, ALBUMIN in the last 168 hours. No results for input(s): LIPASE, AMYLASE in the last 168 hours. No results for input(s): AMMONIA in the last 168 hours. CBC: Recent Labs  Lab 02/18/21 0452  WBC 7.1  HGB 14.3  HCT 43.0  MCV 91.1  PLT 129*   Cardiac Enzymes: Recent Labs  Lab 02/18/21 0452  CKTOTAL 82   BNP: Invalid input(s): POCBNP CBG: No results for input(s): GLUCAP in the last 168 hours.  Time coordinating discharge:  36 minutes  Signed:  Orson Eva, DO [facility] Pager: [phone] 02/21/2021, 4:13 PM

## 2021-02-21 NOTE — Progress Notes (Signed)
PROGRESS NOTE  [name] [name] [name]:[id number] DOB: [birth year] DOA: 01/20/2021 PCP: [name] Mc, NP  Brief History:   60 y.o. male with medical history of MDD with psychosis, seizure disorder, hypertension, tobacco abuse, cognitive impairment, atrial flutter with RVR, chronic hepatitis C, history of cocaine and alcohol abuse presenting due to abnormality with his left second toe.  The patient is a poor historian.  When asked why he is in the emergency department, he states " they tell me there is something wrong with my foot".  Apparently, the patient was getting his toenails trimmed at his assisted living facility [facility]) when it was noticed there was some abnormality and discoloration of his left second toe.  As result, the patient was sent to the emergency department for further evaluation.  The patient himself denies any pain or discomfort about his second toe.  He is unable to provide any history regarding recent injury or trauma.  He states, " I did not there was something wrong with my toe."  He denies any fevers, chills, chest pain, shortness breath, cough, hemoptysis, nausea, vomiting, diarrhea, domino pain, foot pain.  He continues to smoke tobacco.   ED In the emergency department, the patient was afebrile hemodynamically stable with oxygen saturation 100% room air.  BMP showed a sodium 141, potassium 3.9, serum creatinine 1.04.  WBC 7.1, hemoglobin 14.9, platelets 124,000.  Patient was given a dose of vancomycin.  X-ray of the left foot showed soft tissue swelling with erosion of the distal and middle phalanx of the left second toe.  Admission was requested for further work-up and treatment.     Assessment/Plan: Cellulitis left second toe/Acute osteomyelitis -ESR--5>>5 -CRP--0.7>>0.6 -Check ABIs--left = 1.10; right = 1.09 -Patient has palpable dorsalis pedis pulses bilateral -Surgery consult noted>>nonoperative management -Continue vancomycin>>changed to  daptomycin -changed to ceftriaxone -MR left foot--acute osteomyelitis middle and distal phalanges 2nd toe -PICC line placed 11/3 -plan 4 weeks IV abx--last day 02/17/21 -Check BMP, CBC, CRP, ESR -cefdinir and doxy x 6 more days   Bipolar disorder -Continue quetiapine, sertraline, trazodone -Continue hydroxyzine -stable  Atrial flutter -Continue apixaban -Continue metoprolol succinate -hold diltiazem due to bradycardia   Seizure disorder -Continue lamotrigine, Keppra, Vimpat   Tobacco abuse -Tobacco cessation discussed   Thrombocytopenia -Likely secondary to chronic hepatitis C -stable -Monitor for signs of bleeding   Chronic hepatitis C -Appears clinically compensated                       Family Communication:   no Family at bedside   Consultants:  general surgery   Code Status:  FULL    DVT Prophylaxis:  apixaban     Procedures: As Listed in Progress Note Above   Antibiotics: Ceftriaxone Daptomycin -doxy 12/1>> -cefdinir 12/1>>    Subjective: Patient denies fevers, chills, headache, chest pain, dyspnea, nausea, vomiting, diarrhea, abdominal pain, dysuria, hematuria, hematochezia, and melena.   Objective: Vitals:   02/20/21 2054 02/21/21 0557 02/21/21 0959 02/21/21 1429  BP: (!) 153/86 121/73 128/89 127/68  Pulse: 77 95 62 65  Resp: _0 Temp: 97.8 F (36.6 C) 97.7 F (36.5 C)  97.6 F (36.4 C)  TempSrc:      SpO2: 97% 95%  95%  Weight:      Height:        Intake/Output Summary (Last 24 hours) at 02/21/2021 1601 Last data filed at 02/21/2021 0500 Gross per 24 hour  Intake 240 ml  Output 2550 ml  Net -2310 ml   Weight change:  Exam:  General:  Pt is alert, follows commands appropriately, not in acute distress HEENT: No icterus, No thrush, No neck mass, [place]/AT Cardiovascular: RRR, S1/S2, no rubs, no gallops Respiratory: CTA bilaterally, no wheezing, no crackles, no rhonchi Abdomen: Soft/+BS, non tender, non distended,  no guarding Extremities: No edema, No lymphangitis, No petechiae, No rashes, no synovitis   Data Reviewed: I have personally reviewed following labs and imaging studies Basic Metabolic Panel: Recent Labs  Lab 02/18/21 0452  NA 137  K 4.0  CL 104  CO2 26  GLUCOSE 136*  BUN 11  CREATININE 0.80  CALCIUM 8.5*   Liver Function Tests: No results for input(s): AST, ALT, ALKPHOS, BILITOT, PROT, ALBUMIN in the last 168 hours. No results for input(s): LIPASE, AMYLASE in the last 168 hours. No results for input(s): AMMONIA in the last 168 hours. Coagulation Profile: No results for input(s): INR, PROTIME in the last 168 hours. CBC: Recent Labs  Lab 02/18/21 0452  WBC 7.1  HGB 14.3  HCT 43.0  MCV 91.1  PLT 129*   Cardiac Enzymes: Recent Labs  Lab 02/18/21 0452  CKTOTAL 82   BNP: Invalid input(s): POCBNP CBG: No results for input(s): GLUCAP in the last 168 hours. HbA1C: No results for input(s): HGBA1C in the last 72 hours. Urine analysis:    Component Value Date/Time   COLORURINE YELLOW 12/12/2017 [place] 12/12/2017 0931   LABSPEC 1.023 12/12/2017 0931   PHURINE 6.0 12/12/2017 0931   GLUCOSEU NEGATIVE 12/12/2017 0931   HGBUR NEGATIVE 12/12/2017 0931   BILIRUBINUR NEGATIVE 12/12/2017 0931   BILIRUBINUR Small 10/19/2015 1526   KETONESUR NEGATIVE 12/12/2017 0931   PROTEINUR NEGATIVE 12/12/2017 0931   UROBILINOGEN >=8.0 10/19/2015 1526   UROBILINOGEN 0.2 10/31/2014 1840   NITRITE NEGATIVE 12/12/2017 0931   LEUKOCYTESUR NEGATIVE 12/12/2017 0931   Sepsis Labs: _0 (procalcitonin:4,lacticidven:4) )No results found for this or any previous visit (from the past 240 hour(s)).   Scheduled Meds:  apixaban  5 mg Oral BID   cefdinir  300 mg Oral Q12H   Chlorhexidine Gluconate Cloth  6 each Topical Daily   doxycycline  100 mg Oral Q12H   hydrOXYzine  25 mg Oral BID   lacosamide  150 mg Oral BID   lamoTRIgine  25 mg Oral Daily   levETIRAcetam   1,500 mg Oral BID   melatonin  6 mg Oral QHS   metoprolol succinate  25 mg Oral Daily   nicotine  14 mg Transdermal Daily   QUEtiapine  50 mg Oral BID   And   QUEtiapine  200 mg Oral QHS   sodium chloride flush  10-40 mL Intracatheter Q12H   Continuous Infusions:  Procedures/Studies: No results found.  Orson Eva, DO  [facility]  If 7PM-7AM, please contact night-coverage [email or web address] Password [email or web address] 02/21/2021, 4:01 PM   LOS: 31 days

## 2021-02-21 NOTE — Plan of Care (Signed)
  Problem: Education: Goal: Knowledge of General Education information will improve Description: Including pain rating scale, medication(s)/side effects and non-pharmacologic comfort measures Outcome: Progressing   Problem: Health Behavior/Discharge Planning: Goal: Ability to manage health-related needs will improve Outcome: Progressing   Problem: Clinical Measurements: Goal: Ability to maintain clinical measurements within normal limits will improve Outcome: Progressing Goal: Will remain free from infection Outcome: Progressing Goal: Diagnostic test results will improve Outcome: Progressing Goal: Respiratory complications will improve Outcome: Progressing Goal: Cardiovascular complication will be avoided Outcome: Progressing   Problem: Activity: Goal: Risk for activity intolerance will decrease Outcome: Progressing   Problem: Nutrition: Goal: Adequate nutrition will be maintained Outcome: Progressing   Problem: Elimination: Goal: Will not experience complications related to urinary retention Outcome: Progressing   Problem: Pain Managment: Goal: General experience of comfort will improve Outcome: Progressing   Problem: Safety: Goal: Ability to remain free from injury will improve Outcome: Progressing   

## 2021-02-22 NOTE — Progress Notes (Signed)
[name] [name] will [employer]. Due to situation at facility they will have to hold all admissions today.

## 2021-02-22 NOTE — Progress Notes (Signed)
PROGRESS NOTE  [name] [name] VVK:[id number] DOB: Aug 16, 1960 DOA: 01/20/2021 PCP: [name], NP Brief History:   60 y.o. male with medical history of MDD with psychosis, seizure disorder, hypertension, tobacco abuse, cognitive impairment, atrial flutter with RVR, chronic hepatitis C, history of cocaine and alcohol abuse presenting due to abnormality with his left second toe.  The patient is a poor historian.  When asked why he is in the emergency department, he states " they tell me there is something wrong with my foot".  Apparently, the patient was getting his toenails trimmed at his assisted living facility [facility]) when it was noticed there was some abnormality and discoloration of his left second toe.  As result, the patient was sent to the emergency department for further evaluation.  The patient himself denies any pain or discomfort about his second toe.  He is unable to provide any history regarding recent injury or trauma.  He states, " I did not there was something wrong with my toe."  He denies any fevers, chills, chest pain, shortness breath, cough, hemoptysis, nausea, vomiting, diarrhea, domino pain, foot pain.  He continues to smoke tobacco.   ED In the emergency department, the patient was afebrile hemodynamically stable with oxygen saturation 100% room air.  BMP showed a sodium 141, potassium 3.9, serum creatinine 1.04.  WBC 7.1, hemoglobin 14.9, platelets 124,000.  Patient was given a dose of vancomycin.  X-ray of the left foot showed soft tissue swelling with erosion of the distal and middle phalanx of the left second toe.  Admission was requested for further work-up and treatment.     Assessment/Plan: Cellulitis left second toe/Acute osteomyelitis -ESR--5>>5 -CRP--0.7>>0.6 -Check ABIs--left = 1.10; right = 1.09 -Patient has palpable dorsalis pedis pulses bilateral -Surgery consult noted>>nonoperative management -Continue vancomycin>>changed to  daptomycin -changed to ceftriaxone -MR left foot--acute osteomyelitis middle and distal phalanges 2nd toe -PICC line placed 11/3 -plan 4 weeks IV abx--last day 02/17/21 -Check BMP, CBC, CRP, ESR -cefdinir and doxy x 5 more days   Bipolar disorder -Continue quetiapine, sertraline, trazodone -Continue hydroxyzine -stable  Atrial flutter -Continue apixaban -Continue metoprolol succinate -hold diltiazem due to bradycardia   Seizure disorder -Continue lamotrigine, Keppra, Vimpat   Tobacco abuse -Tobacco cessation discussed   Thrombocytopenia -Likely secondary to chronic hepatitis C -stable -Monitor for signs of bleeding   Chronic hepatitis C -Appears clinically compensated                       Family Communication:   no Family at bedside   Consultants:  general surgery   Code Status:  FULL    DVT Prophylaxis:  apixaban     Procedures: As Listed in Progress Note Above   Antibiotics: Ceftriaxone Daptomycin -doxy 12/1>> -cefdinir 12/1>>   Subjective: Patient denies fevers, chills, headache, chest pain, dyspnea, nausea, vomiting, diarrhea, abdominal pain, dysuria, hematuria, hematochezia, and melena.   Objective: Vitals:   02/21/21 2017 02/22/21 0624 02/22/21 0935 02/22/21 1411  BP: 129/87 136/88 130/83 97/81  Pulse: 69 93 65 (!) 56  Resp: _0 Temp: 97.7 F (36.5 C) (!) 97.4 F (36.3 C)  97.8 F (36.6 C)  TempSrc: Oral Oral  Oral  SpO2: 98% 95% 94% 99%  Weight:      Height:        Intake/Output Summary (Last 24 hours) at 02/22/2021 1739 Last data filed at 02/22/2021 0554 Gross per 24 hour  Intake --  Output 800 ml  Net -800 ml   Weight change:  Exam:  General:  Pt is alert, follows commands appropriately, not in acute distress HEENT: No icterus, No thrush, No neck mass, [place]/AT Cardiovascular: RRR, S1/S2, no rubs, no gallops Respiratory: CTA bilaterally, no wheezing, no crackles, no rhonchi Abdomen: Soft/+BS, non tender, non  distended, no guarding Extremities: No edema, No lymphangitis, No petechiae, No rashes, no synovitis   Data Reviewed: I have personally reviewed following labs and imaging studies Basic Metabolic Panel: Recent Labs  Lab 02/18/21 0452  NA 137  K 4.0  CL 104  CO2 26  GLUCOSE 136*  BUN 11  CREATININE 0.80  CALCIUM 8.5*   Liver Function Tests: No results for input(s): AST, ALT, ALKPHOS, BILITOT, PROT, ALBUMIN in the last 168 hours. No results for input(s): LIPASE, AMYLASE in the last 168 hours. No results for input(s): AMMONIA in the last 168 hours. Coagulation Profile: No results for input(s): INR, PROTIME in the last 168 hours. CBC: Recent Labs  Lab 02/18/21 0452  WBC 7.1  HGB 14.3  HCT 43.0  MCV 91.1  PLT 129*   Cardiac Enzymes: Recent Labs  Lab 02/18/21 0452  CKTOTAL 82   BNP: Invalid input(s): POCBNP CBG: No results for input(s): GLUCAP in the last 168 hours. HbA1C: No results for input(s): HGBA1C in the last 72 hours. Urine analysis:    Component Value Date/Time   COLORURINE YELLOW 12/12/2017 [place] 12/12/2017 0931   LABSPEC 1.023 12/12/2017 0931   PHURINE 6.0 12/12/2017 0931   GLUCOSEU NEGATIVE 12/12/2017 0931   HGBUR NEGATIVE 12/12/2017 0931   BILIRUBINUR NEGATIVE 12/12/2017 0931   BILIRUBINUR [place] 10/19/2015 1526   KETONESUR NEGATIVE 12/12/2017 0931   PROTEINUR NEGATIVE 12/12/2017 0931   UROBILINOGEN >=8.0 10/19/2015 1526   UROBILINOGEN 0.2 10/31/2014 1840   NITRITE NEGATIVE 12/12/2017 0931   LEUKOCYTESUR NEGATIVE 12/12/2017 0931   Sepsis Labs: _0 (procalcitonin:4,lacticidven:4) ) Recent Results (from the past 240 hour(s))  Resp Panel by RT-PCR (Flu A&B, Covid) Nasopharyngeal Swab     Status: None   Collection Time: 02/21/21  4:45 PM   Specimen: Nasopharyngeal Swab; Nasopharyngeal(NP) swabs in vial transport medium  Result Value Ref Range Status   SARS Coronavirus 2 by RT PCR NEGATIVE NEGATIVE Final    Comment:  (NOTE) SARS-CoV-2 target nucleic acids are NOT DETECTED.  The SARS-CoV-2 RNA is generally detectable in upper respiratory specimens during the acute phase of infection. The lowest concentration of SARS-CoV-2 viral copies this assay can detect is 138 copies/mL. A negative result does not preclude SARS-Cov-2 infection and should not be used as the sole basis for treatment or other patient management decisions. A negative result may occur with  improper specimen collection/handling, submission of specimen other than nasopharyngeal swab, presence of viral mutation(s) within the areas targeted by this assay, and inadequate number of viral copies(<138 copies/mL). A negative result must be combined with clinical observations, patient history, and epidemiological information. The expected result is Negative.  Fact Sheet for Patients:  EntrepreneurPulse.com.au  Fact Sheet for Healthcare Providers:  IncredibleEmployment.be  This test is no t yet approved or cleared by the [place] FDA and  has been authorized for detection and/or diagnosis of SARS-CoV-2 by FDA under an Emergency Use Authorization (EUA). This EUA will remain  in effect (meaning this test can be used) for the duration of the COVID-19 declaration under Section 564(b)(1) of the Act, 21 U.S.C.section 360bbb-3(b)(1), unless the authorization is terminated  or revoked sooner.       Influenza A by PCR NEGATIVE NEGATIVE Final   Influenza B by PCR NEGATIVE NEGATIVE Final    Comment: (NOTE) The Xpert Xpress SARS-CoV-2/FLU/RSV plus assay is intended as an aid in the diagnosis of influenza from Nasopharyngeal swab specimens and should not be used as a sole basis for treatment. Nasal washings and aspirates are unacceptable for Xpert Xpress SARS-CoV-2/FLU/RSV testing.  Fact Sheet for Patients: EntrepreneurPulse.com.au  Fact Sheet for Healthcare  Providers: IncredibleEmployment.be  This test is not yet approved or cleared by the [place] FDA and has been authorized for detection and/or diagnosis of SARS-CoV-2 by FDA under an Emergency Use Authorization (EUA). This EUA will remain in effect (meaning this test can be used) for the duration of the COVID-19 declaration under Section 564(b)(1) of the Act, 21 U.S.C. section 360bbb-3(b)(1), unless the authorization is terminated or revoked.  Performed at [facility], [address], [place], [place] [address]      Scheduled Meds:  apixaban  5 mg Oral BID   cefdinir  300 mg Oral Q12H   Chlorhexidine Gluconate Cloth  6 each Topical Daily   doxycycline  100 mg Oral Q12H   hydrOXYzine  25 mg Oral BID   lacosamide  150 mg Oral BID   lamoTRIgine  25 mg Oral Daily   levETIRAcetam  1,500 mg Oral BID   melatonin  6 mg Oral QHS   metoprolol succinate  25 mg Oral Daily   nicotine  14 mg Transdermal Daily   QUEtiapine  50 mg Oral BID   And   QUEtiapine  200 mg Oral QHS   sodium chloride flush  10-40 mL Intracatheter Q12H   Continuous Infusions:  Procedures/Studies: No results found.  Orson Eva, DO  [facility]  If 7PM-7AM, please contact night-coverage [email or web address] Password [email or web address] 02/22/2021, 5:39 PM   LOS: 32 days

## 2021-02-23 MED ORDER — DOXYCYCLINE HYCLATE 100 MG PO TABS: 100.0000 mg | ORAL_TABLET | Freq: Two times a day (BID) | ORAL | Status: DC

## 2021-02-23 MED ORDER — CEFDINIR 300 MG PO CAPS: 300.0000 mg | ORAL_CAPSULE | Freq: Two times a day (BID) | ORAL | Status: DC

## 2021-02-23 NOTE — Discharge Summary (Signed)
Physician Discharge Summary  [name] [name] WLS:[id number] DOB: [birth year] DOA: 01/20/2021  PCP: [name], NP  Admit date: 01/20/2021 Discharge date: 02/23/2021  Admitted From: [name] Disposition:  SNF  Recommendations for Outpatient Follow-up:  Follow up with PCP in 1-2 weeks Please obtain BMP/CBC in one week  Discharge Condition: Stable CODE STATUS:FULL Diet recommendation: Regular   Brief/Interim Summary: 60 y.o. male with medical history of MDD with psychosis, seizure disorder, hypertension, tobacco abuse, cognitive impairment, atrial flutter with RVR, chronic hepatitis C, history of cocaine and alcohol abuse presenting due to abnormality with his left second toe.  The patient is a poor historian.  When asked why he is in the emergency department, he states " they tell me there is something wrong with my foot".  Apparently, the patient was getting his toenails trimmed at his assisted living facility [facility]) when it was noticed there was some abnormality and discoloration of his left second toe.  As result, the patient was sent to the emergency department for further evaluation.  The patient himself denies any pain or discomfort about his second toe.  He is unable to provide any history regarding recent injury or trauma.  He states, " I did not there was something wrong with my toe."  He denies any fevers, chills, chest pain, shortness breath, cough, hemoptysis, nausea, vomiting, diarrhea, domino pain, foot pain.  He continues to smoke tobacco.   ED In the emergency department, the patient was afebrile hemodynamically stable with oxygen saturation 100% room air.  BMP showed a sodium 141, potassium 3.9, serum creatinine 1.04.  WBC 7.1, hemoglobin 14.9, platelets 124,000.  Patient was given a dose of vancomycin.  X-ray of the left foot showed soft tissue swelling with erosion of the distal and middle phalanx of the left second toe.  Admission was requested for further work-up and  treatment.   -MR left foot--acute osteomyelitis middle and distal phalanges 2nd toe.  General surgery recommended nonoperative management.  A PICC line was placed and the patient receive 4 weeks of IV cubicin and ceftriaxone.  His toe gradually improved.  His IV abx will be followed with 1 week po abx.  His disposition was difficult as his [name] was not willing to take him back.  Ultimately, he finished his IV antibiotics in the hospital.  During his month in the hospital, he did not have any major behavioral issues or aggressive behavior.  He was continued on his usual baseline bipolar meds without complications.  Discharge Diagnoses:  Cellulitis left second toe/Acute osteomyelitis -ESR--5>>5 -CRP--0.7>>0.6 -Check ABIs--left = 1.10; right = 1.09 -Patient has palpable dorsalis pedis pulses bilateral -Surgery consult noted>>nonoperative management -Continue vancomycin>>changed to daptomycin -changed to ceftriaxone -MR left foot--acute osteomyelitis middle and distal phalanges 2nd toe -PICC line placed 11/3>>removed prior to discharge -plan 4 weeks IV abx--last day 02/17/21 -Check BMP, CBC, CRP, ESR -cefdinir and doxy x 4 more days   Bipolar disorder -Continue quetiapine, sertraline, trazodone -Continue hydroxyzine -stable  Atrial flutter -Continue apixaban -Continue metoprolol succinate -hold diltiazem due to bradycardia   Seizure disorder -Continue lamotrigine, Keppra, Vimpat   Tobacco abuse -Tobacco cessation discussed   Thrombocytopenia -Likely secondary to chronic hepatitis C -stable -Monitor for signs of bleeding   Chronic hepatitis C -Appears clinically    Discharge Instructions   Allergies as of 02/23/2021       Reactions   Carbamazepine Other (See Comments)   Hyponatremia   Depakote [divalproex Sodium] Other (See Comments)   Unknown - on [facility]  Medication List     STOP taking these medications    diltiazem 90 MG tablet Commonly known as:  CARDIZEM   loperamide 2 MG tablet Commonly known as: IMODIUM A-D   ondansetron 4 MG tablet Commonly known as: ZOFRAN   traZODone 150 MG tablet Commonly known as: DESYREL       TAKE these medications    apixaban 5 MG Tabs tablet Commonly known as: ELIQUIS Take 5 mg by mouth 2 (two) times daily.   cefdinir 300 MG capsule Commonly known as: OMNICEF Take 1 capsule (300 mg total) by mouth every 12 (twelve) hours. X 4 days   cholecalciferol 25 MCG (1000 UNIT) tablet Commonly known as: VITAMIN D Take 1,000 Units by mouth daily.   doxycycline 100 MG tablet Commonly known as: VIBRA-TABS Take 1 tablet (100 mg total) by mouth every 12 (twelve) hours. X 4 days   hydrOXYzine 25 MG capsule Commonly known as: VISTARIL Take 25 mg by mouth in the morning and at bedtime.   hydrOXYzine 50 MG capsule Commonly known as: VISTARIL Take 100 mg by mouth daily as needed (agitation).   lamoTRIgine 25 MG tablet Commonly known as: LAMICTAL Take 25 mg by mouth daily.   Latanoprost 0.005 % Emul Place 1 drop into both eyes at bedtime.   levETIRAcetam 750 MG tablet Commonly known as: KEPPRA Take 2 tablets (1,500 mg total) by mouth 2 (two) times daily.   metoprolol succinate 25 MG 24 hr tablet Commonly known as: TOPROL-XL Take 25 mg by mouth daily.   QUEtiapine 200 MG tablet Commonly known as: SEROQUEL Take 200 mg by mouth at bedtime.   QUEtiapine 50 MG tablet Commonly known as: SEROQUEL Take 50 mg by mouth 2 (two) times daily.   sertraline 50 MG tablet Commonly known as: ZOLOFT Take 50 mg by mouth daily.   SUMAtriptan 50 MG tablet Commonly known as: IMITREX Take 1 tablet (50 mg total) by mouth See admin instructions. Give 1 tablet (50 mg) by mouth every 24 hours as needed for migraine headache;  May repeat in 2 hours if headache persists or recurs. Do not exceed 100 mg in 24 hours What changed:  when to take this reasons to take this additional instructions   Vimpat 150 MG  Tabs Generic drug: Lacosamide Take 1 tablet (150 mg total) by mouth 2 (two) times daily.        Allergies  Allergen Reactions   Carbamazepine Other (See Comments)    Hyponatremia   Depakote [Divalproex Sodium] Other (See Comments)    Unknown - on [facility]    Consultations: General surgery   Procedures/Studies: No results found.      Discharge Exam: Vitals:   02/22/21 2139 02/23/21 0559  BP: 128/88 100/78  Pulse: 67 81  Resp: 20 20  Temp: 97.8 F (36.6 C) 98.7 F (37.1 C)  SpO2: 96% 93%   Vitals:   02/22/21 0935 02/22/21 1411 02/22/21 2139 02/23/21 0559  BP: 130/83 97/81 128/88 100/78  Pulse: 65 (!) 56 67 81  Resp: _0 Temp:  97.8 F (36.6 C) 97.8 F (36.6 C) 98.7 F (37.1 C)  TempSrc:  Oral Oral   SpO2: 94% 99% 96% 93%  Weight:      Height:        General: Pt is alert, awake, not in acute distress Cardiovascular: RRR, S1/S2 +, no rubs, no gallops Respiratory: CTA bilaterally, no wheezing, no rhonchi Abdominal: Soft, NT, ND, bowel sounds + Extremities: no  edema, no cyanosis   The results of significant diagnostics from this hospitalization (including imaging, microbiology, ancillary and laboratory) are listed below for reference.    Significant Diagnostic Studies: No results found.  Microbiology: Recent Results (from the past 240 hour(s))  Resp Panel by RT-PCR (Flu A&B, Covid) Nasopharyngeal Swab     Status: None   Collection Time: 02/21/21  4:45 PM   Specimen: Nasopharyngeal Swab; Nasopharyngeal(NP) swabs in vial transport medium  Result Value Ref Range Status   SARS Coronavirus 2 by RT PCR NEGATIVE NEGATIVE Final    Comment: (NOTE) SARS-CoV-2 target nucleic acids are NOT DETECTED.  The SARS-CoV-2 RNA is generally detectable in upper respiratory specimens during the acute phase of infection. The lowest concentration of SARS-CoV-2 viral copies this assay can detect is 138 copies/mL. A negative result does not preclude  SARS-Cov-2 infection and should not be used as the sole basis for treatment or other patient management decisions. A negative result may occur with  improper specimen collection/handling, submission of specimen other than nasopharyngeal swab, presence of viral mutation(s) within the areas targeted by this assay, and inadequate number of viral copies(<138 copies/mL). A negative result must be combined with clinical observations, patient history, and epidemiological information. The expected result is Negative.  Fact Sheet for Patients:  EntrepreneurPulse.com.au  Fact Sheet for Healthcare Providers:  IncredibleEmployment.be  This test is no t yet approved or cleared by the [place] FDA and  has been authorized for detection and/or diagnosis of SARS-CoV-2 by FDA under an Emergency Use Authorization (EUA). This EUA will remain  in effect (meaning this test can be used) for the duration of the COVID-19 declaration under Section 564(b)(1) of the Act, 21 U.S.C.section 360bbb-3(b)(1), unless the authorization is terminated  or revoked sooner.       Influenza A by PCR NEGATIVE NEGATIVE Final   Influenza B by PCR NEGATIVE NEGATIVE Final    Comment: (NOTE) The Xpert Xpress SARS-CoV-2/FLU/RSV plus assay is intended as an aid in the diagnosis of influenza from Nasopharyngeal swab specimens and should not be used as a sole basis for treatment. Nasal washings and aspirates are unacceptable for Xpert Xpress SARS-CoV-2/FLU/RSV testing.  Fact Sheet for Patients: EntrepreneurPulse.com.au  Fact Sheet for Healthcare Providers: IncredibleEmployment.be  This test is not yet approved or cleared by the [place] FDA and has been authorized for detection and/or diagnosis of SARS-CoV-2 by FDA under an Emergency Use Authorization (EUA). This EUA will remain in effect (meaning this test can be used) for the duration of  the COVID-19 declaration under Section 564(b)(1) of the Act, 21 U.S.C. section 360bbb-3(b)(1), unless the authorization is terminated or revoked.  Performed at [facility], [address] [place], [place] [address]      Labs: Basic Metabolic Panel: Recent Labs  Lab 02/18/21 0452  NA 137  K 4.0  CL 104  CO2 26  GLUCOSE 136*  BUN 11  CREATININE 0.80  CALCIUM 8.5*   Liver Function Tests: No results for input(s): AST, ALT, ALKPHOS, BILITOT, PROT, ALBUMIN in the last 168 hours. No results for input(s): LIPASE, AMYLASE in the last 168 hours. No results for input(s): AMMONIA in the last 168 hours. CBC: Recent Labs  Lab 02/18/21 0452  WBC 7.1  HGB 14.3  HCT 43.0  MCV 91.1  PLT 129*   Cardiac Enzymes: Recent Labs  Lab 02/18/21 0452  CKTOTAL 82   BNP: Invalid input(s): POCBNP CBG: No results for input(s): GLUCAP in the last 168 hours.  Time  coordinating discharge:  36 minutes  Signed:  Orson Eva, DO [facility] Pager: [phone] 02/23/2021, 10:45 AM

## 2021-02-23 NOTE — TOC Progression Note (Signed)
Transition of Care [facility]) - Progression Note    Patient Details  Name: [name] [name] MRN: [id number] Date of Birth: June 16, 1960  [facility] CM/SW Contact  Leitha Bleak, RN Phone Number: 02/23/2021, 12:38 PM  Clinical Narrative:   [name] still has a hold on admissions.   Kym Groom from DSS is requesting H&P and DC summary for court record to complete paperwork for legal guardian.     Expected Discharge Plan: Skilled Nursing Facility Barriers to Discharge: SNF Pending Medicaid, SNF Pending bed offer  Expected Discharge Plan and Services Expected Discharge Plan: Skilled Nursing Facility      Living arrangements for the past 2 months: Assisted Living Facility Expected Discharge Date: 01/26/21                    Readmission Risk Interventions Readmission Risk Prevention Plan 01/22/2021  Transportation Screening Complete  HRI or Home Care Consult Complete  Social Work Consult for Recovery Care Planning/Counseling Complete  Palliative Care Screening Not Applicable  Medication Review [employer]) Complete  Some recent data might be hidden

## 2021-02-24 DIAGNOSIS — F3161 Bipolar disorder, current episode mixed, mild: Secondary | ICD-10-CM

## 2021-02-24 MED ORDER — RISAQUAD PO CAPS
1.0000 | ORAL_CAPSULE | Freq: Three times a day (TID) | ORAL | Status: DC
  Administered 2021-02-24 – 2021-03-01 (×15): 1 via ORAL
  Filled 2021-02-24 (×15): qty 1

## 2021-02-24 MED ORDER — FLORANEX PO PACK
1.0000 g | PACK | Freq: Three times a day (TID) | ORAL | 0 refills | Status: AC
Start: 2021-02-24 — End: 2021-03-26

## 2021-02-24 NOTE — Discharge Summary (Addendum)
Physician Discharge Summary  [name] [name] ZOX:[id number] DOB: Feb 04, 1961 DOA: 01/20/2021  PCP: [name], NP  Admit date: 01/20/2021 Discharge date: 02/24/2021  Admitted From: [name] Disposition:  SNF  Recommendations for Outpatient Follow-up:  Follow up with PCP in 1-2 weeks Please obtain BMP/CBC in one week  Discharge Condition: Stable CODE STATUS:FULL Diet recommendation: Regular   The patient was seen and examined, stable to be discharged to SNF when bed available -SNF facility -[facility] has a COVID outbreak, requesting to withhold transfer for now...  Brief/Interim Summary: 60 y.o. male with medical history of MDD with psychosis, seizure disorder, hypertension, tobacco abuse, cognitive impairment, atrial flutter with RVR, chronic hepatitis C, history of cocaine and alcohol abuse presenting due to abnormality with his left second toe.  The patient is a poor historian.  When asked why he is in the emergency department, he states " they tell me there is something wrong with my foot".  Apparently, the patient was getting his toenails trimmed at his assisted living facility [facility]) when it was noticed there was some abnormality and discoloration of his left second toe.  As result, the patient was sent to the emergency department for further evaluation.  The patient himself denies any pain or discomfort about his second toe.  He is unable to provide any history regarding recent injury or trauma.  He states, " I did not there was something wrong with my toe."  He denies any fevers, chills, chest pain, shortness breath, cough, hemoptysis, nausea, vomiting, diarrhea, domino pain, foot pain.  He continues to smoke tobacco.   ED In the emergency department, the patient was afebrile hemodynamically stable with oxygen saturation 100% room air.  BMP showed a sodium 141, potassium 3.9, serum creatinine 1.04.  WBC 7.1, hemoglobin 14.9, platelets 124,000.  Patient was given a dose of  vancomycin.  X-ray of the left foot showed soft tissue swelling with erosion of the distal and middle phalanx of the left second toe.  Admission was requested for further work-up and treatment.   -MR left foot--acute osteomyelitis middle and distal phalanges 2nd toe.  General surgery recommended nonoperative management.  A PICC line was placed and the patient receive 4 weeks of IV cubicin and ceftriaxone.  His toe gradually improved.  His IV abx will be followed with 1 week po abx.  His disposition was difficult as his [name] was not willing to take him back.  Ultimately, he finished his IV antibiotics in the hospital.  During his month in the hospital, he did not have any major behavioral issues or aggressive behavior.  He was continued on his usual baseline bipolar meds without complications.  Discharge Diagnoses:  Cellulitis left second toe/Acute osteomyelitis -ESR--5>>5 -CRP--0.7>>0.6 -Check ABIs--left = 1.10; right = 1.09 -Patient has palpable dorsalis pedis pulses bilateral -Surgery consult noted>>nonoperative management -Continue vancomycin>>changed to daptomycin -changed to ceftriaxone -MR left foot--acute osteomyelitis middle and distal phalanges 2nd toe -PICC line placed 11/3>>removed prior to discharge -plan 4 weeks IV abx--last day 02/17/21 -Check BMP, CBC, CRP, ESR -cefdinir and doxy x 4 more days   Bipolar disorder -Continue quetiapine, sertraline, trazodone -Continue hydroxyzine -stable  Atrial flutter -Continue apixaban -Continue metoprolol succinate -hold diltiazem due to bradycardia   Seizure disorder -Continue lamotrigine, Keppra, Vimpat   Tobacco abuse -Tobacco cessation discussed   Thrombocytopenia -Likely secondary to chronic hepatitis C -stable -Monitor for signs of bleeding   Chronic hepatitis C -Appears clinically    Discharge Instructions   Allergies as of 02/24/2021  Reactions   Carbamazepine Other (See Comments)   Hyponatremia    Depakote [divalproex Sodium] Other (See Comments)   Unknown - on MAR        Medication List     STOP taking these medications    diltiazem 90 MG tablet Commonly known as: CARDIZEM   loperamide 2 MG tablet Commonly known as: IMODIUM A-D   ondansetron 4 MG tablet Commonly known as: ZOFRAN   traZODone 150 MG tablet Commonly known as: DESYREL       TAKE these medications    apixaban 5 MG Tabs tablet Commonly known as: ELIQUIS Take 5 mg by mouth 2 (two) times daily.   cefdinir 300 MG capsule Commonly known as: OMNICEF Take 1 capsule (300 mg total) by mouth every 12 (twelve) hours. X 4 days   cholecalciferol 25 MCG (1000 UNIT) tablet Commonly known as: VITAMIN D Take 1,000 Units by mouth daily.   doxycycline 100 MG tablet Commonly known as: VIBRA-TABS Take 1 tablet (100 mg total) by mouth every 12 (twelve) hours. X 4 days   hydrOXYzine 25 MG capsule Commonly known as: VISTARIL Take 25 mg by mouth in the morning and at bedtime.   hydrOXYzine 50 MG capsule Commonly known as: VISTARIL Take 100 mg by mouth daily as needed (agitation).   lactobacillus Pack Take 1 packet (1 g total) by mouth 3 (three) times daily with meals.   lamoTRIgine 25 MG tablet Commonly known as: LAMICTAL Take 25 mg by mouth daily.   Latanoprost 0.005 % Emul Place 1 drop into both eyes at bedtime.   levETIRAcetam 750 MG tablet Commonly known as: KEPPRA Take 2 tablets (1,500 mg total) by mouth 2 (two) times daily.   metoprolol succinate 25 MG 24 hr tablet Commonly known as: TOPROL-XL Take 25 mg by mouth daily.   QUEtiapine 200 MG tablet Commonly known as: SEROQUEL Take 200 mg by mouth at bedtime.   QUEtiapine 50 MG tablet Commonly known as: SEROQUEL Take 50 mg by mouth 2 (two) times daily.   sertraline 50 MG tablet Commonly known as: ZOLOFT Take 50 mg by mouth daily.   SUMAtriptan 50 MG tablet Commonly known as: IMITREX Take 1 tablet (50 mg total) by mouth See admin  instructions. Give 1 tablet (50 mg) by mouth every 24 hours as needed for migraine headache;  May repeat in 2 hours if headache persists or recurs. Do not exceed 100 mg in 24 hours What changed:  when to take this reasons to take this additional instructions   Vimpat 150 MG Tabs Generic drug: Lacosamide Take 1 tablet (150 mg total) by mouth 2 (two) times daily.        Allergies  Allergen Reactions   Carbamazepine Other (See Comments)    Hyponatremia   Depakote [Divalproex Sodium] Other (See Comments)    Unknown - on MAR    Consultations: General surgery   Procedures/Studies: No results found.      Discharge Exam: Vitals:   02/23/21 2151 02/24/21 0509  BP: 129/66 136/89  Pulse: 81 77  Resp: 20 17  Temp: 98.6 F (37 C) 98.8 F (37.1 C)  SpO2: 99% 98%   Vitals:   02/23/21 0559 02/23/21 1334 02/23/21 2151 02/24/21 0509  BP: 100/78 139/75 129/66 136/89  Pulse: 81 88 81 77  Resp: 20 20 20 17   Temp: 98.7 F (37.1 C) 97.6 F (36.4 C) 98.6 F (37 C) 98.8 F (37.1 C)  TempSrc:      SpO2: 93%  97% 99% 98%  Weight:      Height:           Physical Exam:   General:  Alert, oriented, cooperative, no distress;   HEENT:  Normocephalic, PERRL, otherwise with in Normal limits   Neuro:  CNII-XII intact. , normal motor and sensation, reflexes intact   Lungs:   Clear to auscultation BL, Respirations unlabored, no wheezes / crackles  Cardio:    S1/S2, RRR, No murmure, No Rubs or Gallops   Abdomen:   Soft, non-tender, bowel sounds active all four quadrants,  no guarding or peritoneal signs.  Muscular skeletal:  Limited exam - in bed, able to move all 4 extremities, Normal strength,  2+ pulses,  symmetric, No pitting edema  Skin:  Dry, warm to touch, negative for any Rashes,  Wounds: Please see nursing documentation       The results of significant diagnostics from this hospitalization (including imaging, microbiology, ancillary and laboratory) are listed below  for reference.    Significant Diagnostic Studies: No results found.  Microbiology: Recent Results (from the past 240 hour(s))  Resp Panel by RT-PCR (Flu A&B, Covid) Nasopharyngeal Swab     Status: None   Collection Time: 02/21/21  4:45 PM   Specimen: Nasopharyngeal Swab; Nasopharyngeal(NP) swabs in vial transport medium  Result Value Ref Range Status   SARS Coronavirus 2 by RT PCR NEGATIVE NEGATIVE Final    Comment: (NOTE) SARS-CoV-2 target nucleic acids are NOT DETECTED.  The SARS-CoV-2 RNA is generally detectable in upper respiratory specimens during the acute phase of infection. The lowest concentration of SARS-CoV-2 viral copies this assay can detect is 138 copies/mL. A negative result does not preclude SARS-Cov-2 infection and should not be used as the sole basis for treatment or other patient management decisions. A negative result may occur with  improper specimen collection/handling, submission of specimen other than nasopharyngeal swab, presence of viral mutation(s) within the areas targeted by this assay, and inadequate number of viral copies(<138 copies/mL). A negative result must be combined with clinical observations, patient history, and epidemiological information. The expected result is Negative.  Fact Sheet for Patients:  EntrepreneurPulse.com.au  Fact Sheet for Healthcare Providers:  IncredibleEmployment.be  This test is no t yet approved or cleared by the [place] FDA and  has been authorized for detection and/or diagnosis of SARS-CoV-2 by FDA under an Emergency Use Authorization (EUA). This EUA will remain  in effect (meaning this test can be used) for the duration of the COVID-19 declaration under Section 564(b)(1) of the Act, 21 U.S.C.section 360bbb-3(b)(1), unless the authorization is terminated  or revoked sooner.       Influenza A by PCR NEGATIVE NEGATIVE Final   Influenza B by PCR NEGATIVE NEGATIVE Final     Comment: (NOTE) The Xpert Xpress SARS-CoV-2/FLU/RSV plus assay is intended as an aid in the diagnosis of influenza from Nasopharyngeal swab specimens and should not be used as a sole basis for treatment. Nasal washings and aspirates are unacceptable for Xpert Xpress SARS-CoV-2/FLU/RSV testing.  Fact Sheet for Patients: EntrepreneurPulse.com.au  Fact Sheet for Healthcare Providers: IncredibleEmployment.be  This test is not yet approved or cleared by the [place] FDA and has been authorized for detection and/or diagnosis of SARS-CoV-2 by FDA under an Emergency Use Authorization (EUA). This EUA will remain in effect (meaning this test can be used) for the duration of the COVID-19 declaration under Section 564(b)(1) of the Act, 21 U.S.C. section 360bbb-3(b)(1), unless the authorization is terminated or revoked.  Performed at [facility], [address] [place], [place] [address]      Labs: Basic Metabolic Panel: Recent Labs  Lab 02/18/21 0452  NA 137  K 4.0  CL 104  CO2 26  GLUCOSE 136*  BUN 11  CREATININE 0.80  CALCIUM 8.5*   Liver Function Tests: No results for input(s): AST, ALT, ALKPHOS, BILITOT, PROT, ALBUMIN in the last 168 hours. No results for input(s): LIPASE, AMYLASE in the last 168 hours. No results for input(s): AMMONIA in the last 168 hours. CBC: Recent Labs  Lab 02/18/21 0452  WBC 7.1  HGB 14.3  HCT 43.0  MCV 91.1  PLT 129*   Cardiac Enzymes: Recent Labs  Lab 02/18/21 0452  CKTOTAL 82   BNP: Invalid input(s): POCBNP CBG: No results for input(s): GLUCAP in the last 168 hours.  Time coordinating discharge:  36 minutes  Signed:  Deatra James, DO [facility] Pager: [phone] 02/24/2021, 8:30 AM

## 2021-02-25 NOTE — TOC Progression Note (Signed)
Transition of Care [facility]) - Progression Note    Patient Details  Name: [name] [name] MRN: [id number] Date of Birth: 10-Dec-1960  Transition of [facility]) CM/SW Contact  [name] Brunner, LCSW Phone Number: 02/25/2021, 12:09 PM  Clinical Narrative:    [name] [name] with DSS reported that DSS assessment and court filing for patient's compentency will be for January 12. [name] [name] reported that they will reevaluate for patient's admission Monday due to facility Covid out break. TOC to follow.   Expected Discharge Plan: Skilled Nursing Facility Barriers to Discharge: SNF Pending bed offer, Other (must enter comment) (SNF holding admissions)  Expected Discharge Plan and Services Expected Discharge Plan: Skilled Nursing Facility       Living arrangements for the past 2 months: Assisted Living Facility Expected Discharge Date: 01/26/21                                     Social Determinants of Health (SDOH) Interventions    Readmission Risk Interventions Readmission Risk Prevention Plan 01/22/2021  Transportation Screening Complete  HRI or Home Care Consult Complete  Social Work Consult for Recovery Care Planning/Counseling Complete  Palliative Care Screening Not Applicable  Medication Review Oceanographer) Complete  Some recent data might be hidden

## 2021-02-25 NOTE — Progress Notes (Signed)
Pt woke up confused and saying he had not gotten his medication despite receiving them earlier. Redirected and reminded that he had received his meds but he still insists he didn't receive them.

## 2021-02-25 NOTE — Discharge Summary (Signed)
Pending  Physician Discharge Summary  Cayetano Mikita CLE:[id number] DOB: May 15, 1960 DOA: 01/20/2021  PCP: [name], NP  Admit date: 01/20/2021 Discharge date: 02/25/2021  Admitted From: [name] Disposition:  SNF  Recommendations for Outpatient Follow-up:  Follow up with PCP in 1-2 weeks Please obtain BMP/CBC in one week  Discharge Condition: Stable CODE STATUS:FULL Diet recommendation: Regular  The patient was seen and examined this morning, stable for  discharge -SNF facility -[facility] has a COVID outbreak, requesting to withhold transfer for now...  Brief/Interim Summary: 60 y.o. male with medical history of MDD with psychosis, seizure disorder, hypertension, tobacco abuse, cognitive impairment, atrial flutter with RVR, chronic hepatitis C, history of cocaine and alcohol abuse presenting due to abnormality with his left second toe.  The patient is a poor historian.  When asked why he is in the emergency department, he states " they tell me there is something wrong with my foot".  Apparently, the patient was getting his toenails trimmed at his assisted living facility [facility]) when it was noticed there was some abnormality and discoloration of his left second toe.  As result, the patient was sent to the emergency department for further evaluation.  The patient himself denies any pain or discomfort about his second toe.  He is unable to provide any history regarding recent injury or trauma.  He states, " I did not there was something wrong with my toe."  He denies any fevers, chills, chest pain, shortness breath, cough, hemoptysis, nausea, vomiting, diarrhea, domino pain, foot pain.  He continues to smoke tobacco.   ED In the emergency department, the patient was afebrile hemodynamically stable with oxygen saturation 100% room air.  BMP showed a sodium 141, potassium 3.9, serum creatinine 1.04.  WBC 7.1, hemoglobin 14.9, platelets 124,000.  Patient was given a dose of vancomycin.  X-ray  of the left foot showed soft tissue swelling with erosion of the distal and middle phalanx of the left second toe.  Admission was requested for further work-up and treatment.   -MR left foot--acute osteomyelitis middle and distal phalanges 2nd toe.  General surgery recommended nonoperative management.  A PICC line was placed and the patient receive 4 weeks of IV cubicin and ceftriaxone.  His toe gradually improved.  His IV abx will be followed with 1 week po abx.  His disposition was difficult as his [name] was not willing to take him back.  Ultimately, he finished his IV antibiotics in the hospital.  During his month in the hospital, he did not have any major behavioral issues or aggressive behavior.  He was continued on his usual baseline bipolar meds without complications.  Discharge Diagnoses:  Cellulitis left second toe/Acute osteomyelitis -ESR--5>>5 -CRP--0.7>>0.6 -Check ABIs--left = 1.10; right = 1.09 -Patient has palpable dorsalis pedis pulses bilateral -Surgery consult noted>>nonoperative management -Continue vancomycin>>changed to daptomycin -changed to ceftriaxone -MR left foot--acute osteomyelitis middle and distal phalanges 2nd toe -PICC line placed 11/3>>removed prior to discharge -plan 4 weeks IV abx--last day 02/17/21 -Check BMP, CBC, CRP, ESR -cefdinir and doxy -last dose 02/25/2021  Bipolar disorder -Continue quetiapine, sertraline, trazodone -Continue hydroxyzine Remained stable  Atrial flutter -Continue apixaban -Continue metoprolol succinate -hold diltiazem due to bradycardia -Stable  Seizure disorder -Continue lamotrigine, Keppra, Vimpat   Tobacco abuse -Tobacco cessation discussed   Thrombocytopenia -Likely secondary to chronic hepatitis C -Monitor for signs of bleeding -Stable   Chronic hepatitis C -Appears clinically    Discharge Instructions -stable to be discharged to SNF when bed available  Allergies as of 02/25/2021       Reactions    Carbamazepine Other (See Comments)   Hyponatremia   Depakote [divalproex Sodium] Other (See Comments)   Unknown - on MAR        Medication List     STOP taking these medications    diltiazem 90 MG tablet Commonly known as: CARDIZEM   loperamide 2 MG tablet Commonly known as: IMODIUM A-D   ondansetron 4 MG tablet Commonly known as: ZOFRAN   traZODone 150 MG tablet Commonly known as: DESYREL       TAKE these medications    apixaban 5 MG Tabs tablet Commonly known as: ELIQUIS Take 5 mg by mouth 2 (two) times daily.   cefdinir 300 MG capsule Commonly known as: OMNICEF Take 1 capsule (300 mg total) by mouth every 12 (twelve) hours. X 4 days   cholecalciferol 25 MCG (1000 UNIT) tablet Commonly known as: VITAMIN D Take 1,000 Units by mouth daily.   doxycycline 100 MG tablet Commonly known as: VIBRA-TABS Take 1 tablet (100 mg total) by mouth every 12 (twelve) hours. X 4 days   hydrOXYzine 25 MG capsule Commonly known as: VISTARIL Take 25 mg by mouth in the morning and at bedtime.   hydrOXYzine 50 MG capsule Commonly known as: VISTARIL Take 100 mg by mouth daily as needed (agitation).   lactobacillus Pack Take 1 packet (1 g total) by mouth 3 (three) times daily with meals.   lamoTRIgine 25 MG tablet Commonly known as: LAMICTAL Take 25 mg by mouth daily.   Latanoprost 0.005 % Emul Place 1 drop into both eyes at bedtime.   levETIRAcetam 750 MG tablet Commonly known as: KEPPRA Take 2 tablets (1,500 mg total) by mouth 2 (two) times daily.   metoprolol succinate 25 MG 24 hr tablet Commonly known as: TOPROL-XL Take 25 mg by mouth daily.   QUEtiapine 200 MG tablet Commonly known as: SEROQUEL Take 200 mg by mouth at bedtime.   QUEtiapine 50 MG tablet Commonly known as: SEROQUEL Take 50 mg by mouth 2 (two) times daily.   sertraline 50 MG tablet Commonly known as: ZOLOFT Take 50 mg by mouth daily.   SUMAtriptan 50 MG tablet Commonly known as:  IMITREX Take 1 tablet (50 mg total) by mouth See admin instructions. Give 1 tablet (50 mg) by mouth every 24 hours as needed for migraine headache;  May repeat in 2 hours if headache persists or recurs. Do not exceed 100 mg in 24 hours What changed:  when to take this reasons to take this additional instructions   Vimpat 150 MG Tabs Generic drug: Lacosamide Take 1 tablet (150 mg total) by mouth 2 (two) times daily.        Allergies  Allergen Reactions   Carbamazepine Other (See Comments)    Hyponatremia   Depakote [Divalproex Sodium] Other (See Comments)    Unknown - on MAR    Consultations: General surgery   Procedures/Studies: No results found.      Discharge Exam: Vitals:   02/24/21 2010 02/25/21 0618  BP: 127/70 139/88  Pulse: 80 (!) 42  Resp: 18 20  Temp: 97.8 F (36.6 C) 98.9 F (37.2 C)  SpO2: 98% 99%   Vitals:   02/24/21 0509 02/24/21 1309 02/24/21 2010 02/25/21 0618  BP: 136/89 138/67 127/70 139/88  Pulse: 77 84 80 (!) 42  Resp: _0 Temp: 98.8 F (37.1 C)  97.8 F (36.6 C) 98.9 F (37.2 C)  TempSrc:      SpO2: 98% 98% 98% 99%  Weight:      Height:          Physical Exam:   General:  Alert, oriented, cooperative, no distress;   HEENT:  Normocephalic, PERRL, otherwise with in Normal limits   Neuro:  CNII-XII intact. , normal motor and sensation, reflexes intact   Lungs:   Clear to auscultation BL, Respirations unlabored, no wheezes / crackles  Cardio:    S1/S2, RRR, No murmure, No Rubs or Gallops   Abdomen:   Soft, non-tender, bowel sounds active all four quadrants,  no guarding or peritoneal signs.  Muscular skeletal:  Limited exam - in bed, able to move all 4 extremities, Normal strength,  2+ pulses,  symmetric, No pitting edema  Skin:  Dry, warm to touch, negative for any Rashes,  Wounds: Please see nursing documentation          The results of significant diagnostics from this hospitalization (including imaging,  microbiology, ancillary and laboratory) are listed below for reference.    Significant Diagnostic Studies: No results found.  Microbiology: Recent Results (from the past 240 hour(s))  Resp Panel by RT-PCR (Flu A&B, Covid) Nasopharyngeal Swab     Status: None   Collection Time: 02/21/21  4:45 PM   Specimen: Nasopharyngeal Swab; Nasopharyngeal(NP) swabs in vial transport medium  Result Value Ref Range Status   SARS Coronavirus 2 by RT PCR NEGATIVE NEGATIVE Final    Comment: (NOTE) SARS-CoV-2 target nucleic acids are NOT DETECTED.  The SARS-CoV-2 RNA is generally detectable in upper respiratory specimens during the acute phase of infection. The lowest concentration of SARS-CoV-2 viral copies this assay can detect is 138 copies/mL. A negative result does not preclude SARS-Cov-2 infection and should not be used as the sole basis for treatment or other patient management decisions. A negative result may occur with  improper specimen collection/handling, submission of specimen other than nasopharyngeal swab, presence of viral mutation(s) within the areas targeted by this assay, and inadequate number of viral copies(<138 copies/mL). A negative result must be combined with clinical observations, patient history, and epidemiological information. The expected result is Negative.  Fact Sheet for Patients:  EntrepreneurPulse.com.au  Fact Sheet for Healthcare Providers:  IncredibleEmployment.be  This test is no t yet approved or cleared by the [place] FDA and  has been authorized for detection and/or diagnosis of SARS-CoV-2 by FDA under an Emergency Use Authorization (EUA). This EUA will remain  in effect (meaning this test can be used) for the duration of the COVID-19 declaration under Section 564(b)(1) of the Act, 21 U.S.C.section 360bbb-3(b)(1), unless the authorization is terminated  or revoked sooner.       Influenza A by PCR NEGATIVE  NEGATIVE Final   Influenza B by PCR NEGATIVE NEGATIVE Final    Comment: (NOTE) The Xpert Xpress SARS-CoV-2/FLU/RSV plus assay is intended as an aid in the diagnosis of influenza from Nasopharyngeal swab specimens and should not be used as a sole basis for treatment. Nasal washings and aspirates are unacceptable for Xpert Xpress SARS-CoV-2/FLU/RSV testing.  Fact Sheet for Patients: EntrepreneurPulse.com.au  Fact Sheet for Healthcare Providers: IncredibleEmployment.be  This test is not yet approved or cleared by the [place] FDA and has been authorized for detection and/or diagnosis of SARS-CoV-2 by FDA under an Emergency Use Authorization (EUA). This EUA will remain in effect (meaning this test can be used) for the duration of the COVID-19 declaration under Section 564(b)(1) of the Act, 21  U.S.C. section 360bbb-3(b)(1), unless the authorization is terminated or revoked.  Performed at [facility], [address] [place], [place] [address]      Labs: Basic Metabolic Panel: No results for input(s): NA, K, CL, CO2, GLUCOSE, BUN, CREATININE, CALCIUM, MG, PHOS in the last 168 hours.  Liver Function Tests: No results for input(s): AST, ALT, ALKPHOS, BILITOT, PROT, ALBUMIN in the last 168 hours. No results for input(s): LIPASE, AMYLASE in the last 168 hours. No results for input(s): AMMONIA in the last 168 hours. CBC: No results for input(s): WBC, NEUTROABS, HGB, HCT, MCV, PLT in the last 168 hours.  Cardiac Enzymes: No results for input(s): CKTOTAL, CKMB, CKMBINDEX, TROPONINI in the last 168 hours.  BNP: Invalid input(s): POCBNP CBG: No results for input(s): GLUCAP in the last 168 hours.  Time coordinating discharge:  36 minutes  Signed:  Deatra James, DO [facility] Pager: [phone] 02/25/2021, 11:50 AM

## 2021-02-26 NOTE — TOC Progression Note (Signed)
Transition of Care [facility]) - Progression Note    Patient Details  Name: [name] [name] MRN: [id number] Date of Birth: 24-Feb-1961  [facility]) CM/SW Contact  Leitha Bleak, RN Phone Number: 02/26/2021, 10:37 AM  Clinical Narrative:   Patient is asking for his belongings. TOC called Synetta Fail at [facility]. She will bring his belonging to the hospital to go with patient at discharge. RN updated.  Expected Discharge Plan: Skilled Nursing Facility Barriers to Discharge: SNF Pending bed offer, Other (must enter comment) (SNF holding admissions)  Expected Discharge Plan and Services Expected Discharge Plan: Skilled Nursing Facility      Living arrangements for the past 2 months: [facility]isted [name] Expected Discharge Date: 01/26/21

## 2021-02-26 NOTE — Discharge Summary (Signed)
Pending  Physician Discharge Summary  [name] [name] [id number] DOB: 1960/10/23 DOA: 01/20/2021  PCP: [name], NP  Admit date: 01/20/2021 Discharge date: 02/26/2021  Admitted From: [name] Disposition:  SNF  Recommendations for Outpatient Follow-up:  Follow up with PCP in 1-2 weeks Please obtain BMP/CBC in one week  Discharge Condition: Stable CODE STATUS:FULL Diet recommendation: Regular  The patient was seen and examined this morning awake alert no acute distress PICC line has been discontinued, patient has been allowed to shower ambulate independently -SNF facility -[facility] has a COVID outbreak, requesting to withhold transfer for now...  Brief/Interim Summary: 60 y.o. male with medical history of MDD with psychosis, seizure disorder, hypertension, tobacco abuse, cognitive impairment, atrial flutter with RVR, chronic hepatitis C, history of cocaine and alcohol abuse presenting due to abnormality with his left second toe.  The patient is a poor historian.  When asked why he is in the emergency department, he states " they tell me there is something wrong with my foot".  Apparently, the patient was getting his toenails trimmed at his assisted living facility [facility]) when it was noticed there was some abnormality and discoloration of his left second toe.  As result, the patient was sent to the emergency department for further evaluation.  The patient himself denies any pain or discomfort about his second toe.  He is unable to provide any history regarding recent injury or trauma.  He states, " I did not there was something wrong with my toe."  He denies any fevers, chills, chest pain, shortness breath, cough, hemoptysis, nausea, vomiting, diarrhea, domino pain, foot pain.  He continues to smoke tobacco.   ED In the emergency department, the patient was afebrile hemodynamically stable with oxygen saturation 100% room air.  BMP showed a sodium 141, potassium 3.9, serum creatinine  1.04.  WBC 7.1, hemoglobin 14.9, platelets 124,000.  Patient was given a dose of vancomycin.  X-ray of the left foot showed soft tissue swelling with erosion of the distal and middle phalanx of the left second toe.  Admission was requested for further work-up and treatment.   -MR left foot--acute osteomyelitis middle and distal phalanges 2nd toe.  General surgery recommended nonoperative management.  A PICC line was placed and the patient receive 4 weeks of IV cubicin and ceftriaxone.  His toe gradually improved.  His IV abx will be followed with 1 week po abx.  His disposition was difficult as his [name] was not willing to take him back.  Ultimately, he finished his IV antibiotics in the hospital.  During his month in the hospital, he did not have any major behavioral issues or aggressive behavior.  He was continued on his usual baseline bipolar meds without complications.  Discharge Diagnoses:  Cellulitis left second toe/Acute osteomyelitis Much improved hemodynamically stable  -ESR--5>>5 -CRP--0.7>>0.6 -Check ABIs--left = 1.10; right = 1.09 -Patient has palpable dorsalis pedis pulses bilateral -Surgery consult noted>>nonoperative management -Continue vancomycin>>changed to daptomycin -Completed IV antibiotics of ceftriaxone -MR left foot--acute osteomyelitis middle and distal phalanges 2nd toe -PICC line placed 11/3>>removed prior to discharge..  Discontinued -plan 4 weeks IV abx--last day 02/17/21 -Check BMP, CBC, CRP, ESR -cefdinir and doxy -last dose 02/25/2021  Bipolar disorder -Continue quetiapine, sertraline, trazodone -Continue hydroxyzine Remained stable  Atrial flutter -Continue apixaban -Continue metoprolol succinate -hold diltiazem due to bradycardia -Stable  Seizure disorder -Continue lamotrigine, Keppra, Vimpat   Tobacco abuse -Tobacco cessation discussed   Thrombocytopenia -Likely secondary to chronic hepatitis C -Monitor for signs of bleeding -  Stable   Chronic  hepatitis C -Appears clinically    Discharge Instructions -stable to be discharged to SNF when bed available   Allergies as of 02/26/2021       Reactions   Carbamazepine Other (See Comments)   Hyponatremia   Depakote [divalproex Sodium] Other (See Comments)   Unknown - on MAR        Medication List     STOP taking these medications    diltiazem 90 MG tablet Commonly known as: CARDIZEM   loperamide 2 MG tablet Commonly known as: IMODIUM A-D   ondansetron 4 MG tablet Commonly known as: ZOFRAN   traZODone 150 MG tablet Commonly known as: DESYREL       TAKE these medications    apixaban 5 MG Tabs tablet Commonly known as: ELIQUIS Take 5 mg by mouth 2 (two) times daily.   cefdinir 300 MG capsule Commonly known as: OMNICEF Take 1 capsule (300 mg total) by mouth every 12 (twelve) hours. X 4 days   cholecalciferol 25 MCG (1000 UNIT) tablet Commonly known as: VITAMIN D Take 1,000 Units by mouth daily.   doxycycline 100 MG tablet Commonly known as: VIBRA-TABS Take 1 tablet (100 mg total) by mouth every 12 (twelve) hours. X 4 days   hydrOXYzine 25 MG capsule Commonly known as: VISTARIL Take 25 mg by mouth in the morning and at bedtime.   hydrOXYzine 50 MG capsule Commonly known as: VISTARIL Take 100 mg by mouth daily as needed (agitation).   lactobacillus Pack Take 1 packet (1 g total) by mouth 3 (three) times daily with meals.   lamoTRIgine 25 MG tablet Commonly known as: LAMICTAL Take 25 mg by mouth daily.   Latanoprost 0.005 % Emul Place 1 drop into both eyes at bedtime.   levETIRAcetam 750 MG tablet Commonly known as: KEPPRA Take 2 tablets (1,500 mg total) by mouth 2 (two) times daily.   metoprolol succinate 25 MG 24 hr tablet Commonly known as: TOPROL-XL Take 25 mg by mouth daily.   QUEtiapine 200 MG tablet Commonly known as: SEROQUEL Take 200 mg by mouth at bedtime.   QUEtiapine 50 MG tablet Commonly known as: SEROQUEL Take 50 mg by  mouth 2 (two) times daily.   sertraline 50 MG tablet Commonly known as: ZOLOFT Take 50 mg by mouth daily.   SUMAtriptan 50 MG tablet Commonly known as: IMITREX Take 1 tablet (50 mg total) by mouth See admin instructions. Give 1 tablet (50 mg) by mouth every 24 hours as needed for migraine headache;  May repeat in 2 hours if headache persists or recurs. Do not exceed 100 mg in 24 hours What changed:  when to take this reasons to take this additional instructions   Vimpat 150 MG Tabs Generic drug: Lacosamide Take 1 tablet (150 mg total) by mouth 2 (two) times daily.        Allergies  Allergen Reactions   Carbamazepine Other (See Comments)    Hyponatremia   Depakote [Divalproex Sodium] Other (See Comments)    Unknown - on MAR    Consultations: General surgery   Procedures/Studies: No results found.      Discharge Exam: Vitals:   02/26/21 0855 02/26/21 1311  BP: 101/70 121/89  Pulse: 94 94  Resp: 18 18  Temp: 97.9 F (36.6 C) 98.2 F (36.8 C)  SpO2: 97% 97%   Vitals:   02/25/21 2222 02/26/21 0630 02/26/21 0855 02/26/21 1311  BP: 116/78 (!) 139/91 101/70 121/89  Pulse: 83 73  94 94  Resp: _0 Temp: 98.2 F (36.8 C) 98.5 F (36.9 C) 97.9 F (36.6 C) 98.2 F (36.8 C)  TempSrc: Oral  Oral Oral  SpO2: 94% (!) 89% 97% 97%  Weight:      Height:          Physical Exam:   General:  Alert, oriented, cooperative, no distress;   HEENT:  Normocephalic, PERRL, otherwise with in Normal limits   Neuro:  CNII-XII intact. , normal motor and sensation, reflexes intact   Lungs:   Clear to auscultation BL, Respirations unlabored, no wheezes / crackles  Cardio:    S1/S2, RRR, No murmure, No Rubs or Gallops   Abdomen:   Soft, non-tender, bowel sounds active all four quadrants,  no guarding or peritoneal signs.  Muscular skeletal:  Limited exam - in bed, able to move all 4 extremities, Normal strength,  2+ pulses,  symmetric, No pitting edema   Skin:  Dry,  warm to touch, excessively dry deformed nails and lower extremities Tip of the left second toe dry gangrene noted  Wounds: Please see nursing documentation             The results of significant diagnostics from this hospitalization (including imaging, microbiology, ancillary and laboratory) are listed below for reference.    Significant Diagnostic Studies: No results found.  Microbiology: Recent Results (from the past 240 hour(s))  Resp Panel by RT-PCR (Flu A&B, Covid) Nasopharyngeal Swab     Status: None   Collection Time: 02/21/21  4:45 PM   Specimen: Nasopharyngeal Swab; Nasopharyngeal(NP) swabs in vial transport medium  Result Value Ref Range Status   SARS Coronavirus 2 by RT PCR NEGATIVE NEGATIVE Final    Comment: (NOTE) SARS-CoV-2 target nucleic acids are NOT DETECTED.  The SARS-CoV-2 RNA is generally detectable in upper respiratory specimens during the acute phase of infection. The lowest concentration of SARS-CoV-2 viral copies this assay can detect is 138 copies/mL. A negative result does not preclude SARS-Cov-2 infection and should not be used as the sole basis for treatment or other patient management decisions. A negative result may occur with  improper specimen collection/handling, submission of specimen other than nasopharyngeal swab, presence of viral mutation(s) within the areas targeted by this assay, and inadequate number of viral copies(<138 copies/mL). A negative result must be combined with clinical observations, patient history, and epidemiological information. The expected result is Negative.  Fact Sheet for Patients:  EntrepreneurPulse.com.au  Fact Sheet for Healthcare Providers:  IncredibleEmployment.be  This test is no t yet approved or cleared by the [place] FDA and  has been authorized for detection and/or diagnosis of SARS-CoV-2 by FDA under an Emergency Use Authorization (EUA). This EUA will remain   in effect (meaning this test can be used) for the duration of the COVID-19 declaration under Section 564(b)(1) of the Act, 21 U.S.C.section 360bbb-3(b)(1), unless the authorization is terminated  or revoked sooner.       Influenza A by PCR NEGATIVE NEGATIVE Final   Influenza B by PCR NEGATIVE NEGATIVE Final    Comment: (NOTE) The Xpert Xpress SARS-CoV-2/FLU/RSV plus assay is intended as an aid in the diagnosis of influenza from Nasopharyngeal swab specimens and should not be used as a sole basis for treatment. Nasal washings and aspirates are unacceptable for Xpert Xpress SARS-CoV-2/FLU/RSV testing.  Fact Sheet for Patients: EntrepreneurPulse.com.au  Fact Sheet for Healthcare Providers: IncredibleEmployment.be  This test is not yet approved or cleared by the [place] FDA  and has been authorized for detection and/or diagnosis of SARS-CoV-2 by FDA under an Emergency Use Authorization (EUA). This EUA will remain in effect (meaning this test can be used) for the duration of the COVID-19 declaration under Section 564(b)(1) of the Act, 21 U.S.C. section 360bbb-3(b)(1), unless the authorization is terminated or revoked.  Performed at [facility], [address], [place], [place] [address]      Labs: Basic Metabolic Panel: No results for input(s): NA, K, CL, CO2, GLUCOSE, BUN, CREATININE, CALCIUM, MG, PHOS in the last 168 hours.  Liver Function Tests: No results for input(s): AST, ALT, ALKPHOS, BILITOT, PROT, ALBUMIN in the last 168 hours. No results for input(s): LIPASE, AMYLASE in the last 168 hours. No results for input(s): AMMONIA in the last 168 hours. CBC: No results for input(s): WBC, NEUTROABS, HGB, HCT, MCV, PLT in the last 168 hours.  Cardiac Enzymes: No results for input(s): CKTOTAL, CKMB, CKMBINDEX, TROPONINI in the last 168 hours.  BNP: Invalid input(s): POCBNP CBG: No results for input(s): GLUCAP in the last 168  hours.  Time coordinating discharge:  36 minutes  Signed:  Deatra James, DO [facility] Pager: [phone] 02/26/2021, 3:02 PM

## 2021-02-27 NOTE — Discharge Summary (Signed)
Pending  Physician Discharge Summary  Carmel Waddington XIH:[id number] DOB: 11/12/1960 DOA: 01/20/2021  PCP: [name], NP  Admit date: 01/20/2021 Discharge date: 02/27/2021  Admitted From: [name] Disposition:  SNF  Recommendations for Outpatient Follow-up:  Follow up with PCP in 1-2 weeks Please obtain BMP/CBC in one week  Discharge Condition: Stable CODE STATUS:FULL Diet recommendation: Regular  The patient was seen and examined this morning-remain stable no changes -SNF facility -[facility] has a COVID outbreak, requesting to withhold transfer for now...  Brief/Interim Summary: 60 y.o. male with medical history of MDD with psychosis, seizure disorder, hypertension, tobacco abuse, cognitive impairment, atrial flutter with RVR, chronic hepatitis C, history of cocaine and alcohol abuse presenting due to abnormality with his left second toe.  The patient is a poor historian.  When asked why he is in the emergency department, he states " they tell me there is something wrong with my foot".  Apparently, the patient was getting his toenails trimmed at his assisted living facility [facility]) when it was noticed there was some abnormality and discoloration of his left second toe.  As result, the patient was sent to the emergency department for further evaluation.  The patient himself denies any pain or discomfort about his second toe.  He is unable to provide any history regarding recent injury or trauma.  He states, " I did not there was something wrong with my toe."  He denies any fevers, chills, chest pain, shortness breath, cough, hemoptysis, nausea, vomiting, diarrhea, domino pain, foot pain.  He continues to smoke tobacco.   ED In the emergency department, the patient was afebrile hemodynamically stable with oxygen saturation 100% room air.  BMP showed a sodium 141, potassium 3.9, serum creatinine 1.04.  WBC 7.1, hemoglobin 14.9, platelets 124,000.  Patient was given a dose of vancomycin.   X-ray of the left foot showed soft tissue swelling with erosion of the distal and middle phalanx of the left second toe.  Admission was requested for further work-up and treatment.   -MR left foot--acute osteomyelitis middle and distal phalanges 2nd toe.  General surgery recommended nonoperative management.  A PICC line was placed and the patient receive 4 weeks of IV cubicin and ceftriaxone.  His toe gradually improved.  His IV abx will be followed with 1 week po abx.  His disposition was difficult as his [name] was not willing to take him back.  Ultimately, he finished his IV antibiotics in the hospital.  During his month in the hospital, he did not have any major behavioral issues or aggressive behavior.  He was continued on his usual baseline bipolar meds without complications.  Discharge Diagnoses:  Cellulitis left second toe/Acute osteomyelitis Much improved hemodynamically stable -Completed full dose of IV and p.o. antibiotics  -ESR--5>>5 -CRP--0.7>>0.6 -Check ABIs--left = 1.10; right = 1.09 -Patient has palpable dorsalis pedis pulses bilateral -Surgery consult noted>>nonoperative management -Continue vancomycin>>changed to daptomycin -Completed IV antibiotics of ceftriaxone -MR left foot--acute osteomyelitis middle and distal phalanges 2nd toe -PICC line placed 11/3>>removed prior to discharge..  Discontinued -4 weeks IV abx--last day 02/17/21 -cefdinir and doxy -last dose 02/25/2021  Bipolar disorder-Continue quetiapine, sertraline, trazodone -Continue hydroxyzine Remained stable  Atrial flutter -continue apixaban, metoprolol succinate, -Discontinue Diltiazem due to bradycardia   Seizure disorder -Continue lamotrigine, Keppra, Vimpat   Tobacco abuse-Tobacco cessation discussed   Thrombocytopenia -Likely secondary to chronic hepatitis C -Monitor for signs of bleeding -Stable   Chronic hepatitis C -Appears clinically    Discharge Instructions -stable to be  discharged to  SNF when bed available   Allergies as of 02/27/2021       Reactions   Carbamazepine Other (See Comments)   Hyponatremia   Depakote [divalproex Sodium] Other (See Comments)   Unknown - on MAR        Medication List     STOP taking these medications    diltiazem 90 MG tablet Commonly known as: CARDIZEM   loperamide 2 MG tablet Commonly known as: IMODIUM A-D   ondansetron 4 MG tablet Commonly known as: ZOFRAN   traZODone 150 MG tablet Commonly known as: DESYREL       TAKE these medications    apixaban 5 MG Tabs tablet Commonly known as: ELIQUIS Take 5 mg by mouth 2 (two) times daily.   cefdinir 300 MG capsule Commonly known as: OMNICEF Take 1 capsule (300 mg total) by mouth every 12 (twelve) hours. X 4 days   cholecalciferol 25 MCG (1000 UNIT) tablet Commonly known as: VITAMIN D Take 1,000 Units by mouth daily.   doxycycline 100 MG tablet Commonly known as: VIBRA-TABS Take 1 tablet (100 mg total) by mouth every 12 (twelve) hours. X 4 days   hydrOXYzine 25 MG capsule Commonly known as: VISTARIL Take 25 mg by mouth in the morning and at bedtime.   hydrOXYzine 50 MG capsule Commonly known as: VISTARIL Take 100 mg by mouth daily as needed (agitation).   lactobacillus Pack Take 1 packet (1 g total) by mouth 3 (three) times daily with meals.   lamoTRIgine 25 MG tablet Commonly known as: LAMICTAL Take 25 mg by mouth daily.   Latanoprost 0.005 % Emul Place 1 drop into both eyes at bedtime.   levETIRAcetam 750 MG tablet Commonly known as: KEPPRA Take 2 tablets (1,500 mg total) by mouth 2 (two) times daily.   metoprolol succinate 25 MG 24 hr tablet Commonly known as: TOPROL-XL Take 25 mg by mouth daily.   QUEtiapine 200 MG tablet Commonly known as: SEROQUEL Take 200 mg by mouth at bedtime.   QUEtiapine 50 MG tablet Commonly known as: SEROQUEL Take 50 mg by mouth 2 (two) times daily.   sertraline 50 MG tablet Commonly known as: ZOLOFT Take 50  mg by mouth daily.   SUMAtriptan 50 MG tablet Commonly known as: IMITREX Take 1 tablet (50 mg total) by mouth See admin instructions. Give 1 tablet (50 mg) by mouth every 24 hours as needed for migraine headache;  May repeat in 2 hours if headache persists or recurs. Do not exceed 100 mg in 24 hours What changed:  when to take this reasons to take this additional instructions   Vimpat 150 MG Tabs Generic drug: Lacosamide Take 1 tablet (150 mg total) by mouth 2 (two) times daily.        Allergies  Allergen Reactions   Carbamazepine Other (See Comments)    Hyponatremia   Depakote [Divalproex Sodium] Other (See Comments)    Unknown - on MAR    Consultations: General surgery   Procedures/Studies: No results found.      Discharge Exam: Vitals:   02/26/21 2225 02/27/21 0630  BP: 127/85 (!) 152/84  Pulse: 86 92  Resp: 18 17  Temp: 97.8 F (36.6 C) 98.2 F (36.8 C)  SpO2: 96% 94%   Vitals:   02/26/21 0855 02/26/21 1311 02/26/21 2225 02/27/21 0630  BP: 101/70 121/89 127/85 (!) 152/84  Pulse: 94 94 86 92  Resp: 18 18 18 17   Temp: 97.9 F (36.6 C) 98.2  F (36.8 C) 97.8 F (36.6 C) 98.2 F (36.8 C)  TempSrc: Oral Oral Oral   SpO2: 97% 97% 96% 94%  Weight:      Height:           Physical Exam:   General:  Alert, oriented, cooperative, no distress;   HEENT:  Normocephalic, PERRL, otherwise with in Normal limits   Neuro:  CNII-XII intact. , normal motor and sensation, reflexes intact   Lungs:   Clear to auscultation BL, Respirations unlabored, no wheezes / crackles  Cardio:    S1/S2, RRR, No murmure, No Rubs or Gallops   Abdomen:   Soft, non-tender, bowel sounds active all four quadrants,  no guarding or peritoneal signs.  Muscular skeletal:  Limited exam - in bed, able to move all 4 extremities, Normal strength,  2+ pulses,  symmetric, No pitting edema  Skin:  Dry, warm to touch, negative for any Rashes, Performed lower extremity toenails, left second  toe tip gangrene  Wounds: Please see nursing documentation           The results of significant diagnostics from this hospitalization (including imaging, microbiology, ancillary and laboratory) are listed below for reference.    Significant Diagnostic Studies: No results found.  Microbiology: Recent Results (from the past 240 hour(s))  Resp Panel by RT-PCR (Flu A&B, Covid) Nasopharyngeal Swab     Status: None   Collection Time: 02/21/21  4:45 PM   Specimen: Nasopharyngeal Swab; Nasopharyngeal(NP) swabs in vial transport medium  Result Value Ref Range Status   SARS Coronavirus 2 by RT PCR NEGATIVE NEGATIVE Final    Comment: (NOTE) SARS-CoV-2 target nucleic acids are NOT DETECTED.  The SARS-CoV-2 RNA is generally detectable in upper respiratory specimens during the acute phase of infection. The lowest concentration of SARS-CoV-2 viral copies this assay can detect is 138 copies/mL. A negative result does not preclude SARS-Cov-2 infection and should not be used as the sole basis for treatment or other patient management decisions. A negative result may occur with  improper specimen collection/handling, submission of specimen other than nasopharyngeal swab, presence of viral mutation(s) within the areas targeted by this assay, and inadequate number of viral copies(<138 copies/mL). A negative result must be combined with clinical observations, patient history, and epidemiological information. The expected result is Negative.  Fact Sheet for Patients:  EntrepreneurPulse.com.au  Fact Sheet for Healthcare Providers:  IncredibleEmployment.be  This test is no t yet approved or cleared by the [place] FDA and  has been authorized for detection and/or diagnosis of SARS-CoV-2 by FDA under an Emergency Use Authorization (EUA). This EUA will remain  in effect (meaning this test can be used) for the duration of the COVID-19 declaration under  Section 564(b)(1) of the Act, 21 U.S.C.section 360bbb-3(b)(1), unless the authorization is terminated  or revoked sooner.       Influenza A by PCR NEGATIVE NEGATIVE Final   Influenza B by PCR NEGATIVE NEGATIVE Final    Comment: (NOTE) The Xpert Xpress SARS-CoV-2/FLU/RSV plus assay is intended as an aid in the diagnosis of influenza from Nasopharyngeal swab specimens and should not be used as a sole basis for treatment. Nasal washings and aspirates are unacceptable for Xpert Xpress SARS-CoV-2/FLU/RSV testing.  Fact Sheet for Patients: EntrepreneurPulse.com.au  Fact Sheet for Healthcare Providers: IncredibleEmployment.be  This test is not yet approved or cleared by the [place] FDA and has been authorized for detection and/or diagnosis of SARS-CoV-2 by FDA under an Emergency Use Authorization (EUA). This EUA will  remain in effect (meaning this test can be used) for the duration of the COVID-19 declaration under Section 564(b)(1) of the Act, 21 U.S.C. section 360bbb-3(b)(1), unless the authorization is terminated or revoked.  Performed at [facility], [address] [place], [place] [address]      Labs: Basic Metabolic Panel: No results for input(s): NA, K, CL, CO2, GLUCOSE, BUN, CREATININE, CALCIUM, MG, PHOS in the last 168 hours.  Liver Function Tests: No results for input(s): AST, ALT, ALKPHOS, BILITOT, PROT, ALBUMIN in the last 168 hours. No results for input(s): LIPASE, AMYLASE in the last 168 hours. No results for input(s): AMMONIA in the last 168 hours. CBC: No results for input(s): WBC, NEUTROABS, HGB, HCT, MCV, PLT in the last 168 hours.  Cardiac Enzymes: No results for input(s): CKTOTAL, CKMB, CKMBINDEX, TROPONINI in the last 168 hours.  BNP: Invalid input(s): POCBNP CBG: No results for input(s): GLUCAP in the last 168 hours.  Time coordinating discharge:  36 minutes  Signed:  Deatra James, MD  [facility] Pager: [phone] 02/27/2021, 11:51 AM

## 2021-02-28 NOTE — Discharge Summary (Signed)
Pending  Physician Discharge Summary  [name] [name] VOJ:[id number] DOB: 03-Apr-1960 DOA: 01/20/2021  PCP: [name], NP  Admit date: 01/20/2021 Discharge date: 02/28/2021  Admitted From: [name] Disposition:  SNF  Recommendations for Outpatient Follow-up:  Follow up with PCP in 1-2 weeks Please obtain BMP/CBC in one week  Discharge Condition: Stable CODE STATUS:FULL Diet recommendation: Regular  The patient was seen and examined stable this morning, instructed regarding lower extremity hygiene... Skin moisturization etc.  -SNF facility -[facility] has a COVID outbreak, requesting to withhold transfer for now...  Brief/Interim Summary: 60 y.o. male with medical history of MDD with psychosis, seizure disorder, hypertension, tobacco abuse, cognitive impairment, atrial flutter with RVR, chronic hepatitis C, history of cocaine and alcohol abuse presenting due to abnormality with his left second toe.  The patient is a poor historian.  When asked why he is in the emergency department, he states " they tell me there is something wrong with my foot".  Apparently, the patient was getting his toenails trimmed at his assisted living facility [facility]) when it was noticed there was some abnormality and discoloration of his left second toe.  As result, the patient was sent to the emergency department for further evaluation.  The patient himself denies any pain or discomfort about his second toe.  He is unable to provide any history regarding recent injury or trauma.  He states, " I did not there was something wrong with my toe."  He denies any fevers, chills, chest pain, shortness breath, cough, hemoptysis, nausea, vomiting, diarrhea, domino pain, foot pain.  He continues to smoke tobacco.   ED In the emergency department, the patient was afebrile hemodynamically stable with oxygen saturation 100% room air.  BMP showed a sodium 141, potassium 3.9, serum creatinine 1.04.  WBC 7.1, hemoglobin 14.9,  platelets 124,000.  Patient was given a dose of vancomycin.  X-ray of the left foot showed soft tissue swelling with erosion of the distal and middle phalanx of the left second toe.  Admission was requested for further work-up and treatment.   -MR left foot--acute osteomyelitis middle and distal phalanges 2nd toe.  General surgery recommended nonoperative management.  A PICC line was placed and the patient receive 4 weeks of IV cubicin and ceftriaxone.  His toe gradually improved.  His IV abx will be followed with 1 week po abx.  His disposition was difficult as his [name] was not willing to take him back.  Ultimately, he finished his IV antibiotics in the hospital.  During his month in the hospital, he did not have any major behavioral issues or aggressive behavior.  He was continued on his usual baseline bipolar meds without complications.  Discharge Diagnoses:  Cellulitis left second toe/Acute osteomyelitis Much improved hemodynamically stable -Completed full dose of IV and p.o. antibiotics  -ESR--5>>5 -CRP--0.7>>0.6 -Check ABIs--left = 1.10; right = 1.09 -Patient has palpable dorsalis pedis pulses bilateral -Surgery consult noted>>nonoperative management -Continue vancomycin>>changed to daptomycin -Completed IV antibiotics of ceftriaxone -MR left foot--acute osteomyelitis middle and distal phalanges 2nd toe -PICC line placed 11/3>>removed prior to discharge..  Discontinued -4 weeks IV abx--last day 02/17/21 -cefdinir and doxy -last dose 02/25/2021  Bipolar disorder-Continue quetiapine, sertraline, trazodone -Continue hydroxyzine Remained stable  Atrial flutter -continue apixaban, metoprolol succinate, -Discontinue Diltiazem due to bradycardia   Seizure disorder -Continue lamotrigine, Keppra, Vimpat   Tobacco abuse-Tobacco cessation discussed   Thrombocytopenia -Likely secondary to chronic hepatitis C -Monitor for signs of bleeding -Stable   Chronic hepatitis C -Appears  clinically  Discharge Instructions -stable to be discharged to SNF when bed available   Allergies as of 02/28/2021       Reactions   Carbamazepine Other (See Comments)   Hyponatremia   Depakote [divalproex Sodium] Other (See Comments)   Unknown - on MAR        Medication List     STOP taking these medications    diltiazem 90 MG tablet Commonly known as: CARDIZEM   loperamide 2 MG tablet Commonly known as: IMODIUM A-D   ondansetron 4 MG tablet Commonly known as: ZOFRAN   traZODone 150 MG tablet Commonly known as: DESYREL       TAKE these medications    apixaban 5 MG Tabs tablet Commonly known as: ELIQUIS Take 5 mg by mouth 2 (two) times daily.   cefdinir 300 MG capsule Commonly known as: OMNICEF Take 1 capsule (300 mg total) by mouth every 12 (twelve) hours. X 4 days   cholecalciferol 25 MCG (1000 UNIT) tablet Commonly known as: VITAMIN D Take 1,000 Units by mouth daily.   doxycycline 100 MG tablet Commonly known as: VIBRA-TABS Take 1 tablet (100 mg total) by mouth every 12 (twelve) hours. X 4 days   hydrOXYzine 25 MG capsule Commonly known as: VISTARIL Take 25 mg by mouth in the morning and at bedtime.   hydrOXYzine 50 MG capsule Commonly known as: VISTARIL Take 100 mg by mouth daily as needed (agitation).   lactobacillus Pack Take 1 packet (1 g total) by mouth 3 (three) times daily with meals.   lamoTRIgine 25 MG tablet Commonly known as: LAMICTAL Take 25 mg by mouth daily.   Latanoprost 0.005 % Emul Place 1 drop into both eyes at bedtime.   levETIRAcetam 750 MG tablet Commonly known as: KEPPRA Take 2 tablets (1,500 mg total) by mouth 2 (two) times daily.   metoprolol succinate 25 MG 24 hr tablet Commonly known as: TOPROL-XL Take 25 mg by mouth daily.   QUEtiapine 200 MG tablet Commonly known as: SEROQUEL Take 200 mg by mouth at bedtime.   QUEtiapine 50 MG tablet Commonly known as: SEROQUEL Take 50 mg by mouth 2 (two) times  daily.   sertraline 50 MG tablet Commonly known as: ZOLOFT Take 50 mg by mouth daily.   SUMAtriptan 50 MG tablet Commonly known as: IMITREX Take 1 tablet (50 mg total) by mouth See admin instructions. Give 1 tablet (50 mg) by mouth every 24 hours as needed for migraine headache;  May repeat in 2 hours if headache persists or recurs. Do not exceed 100 mg in 24 hours What changed:  when to take this reasons to take this additional instructions   Vimpat 150 MG Tabs Generic drug: Lacosamide Take 1 tablet (150 mg total) by mouth 2 (two) times daily.        Allergies  Allergen Reactions   Carbamazepine Other (See Comments)    Hyponatremia   Depakote [Divalproex Sodium] Other (See Comments)    Unknown - on MAR    Consultations: General surgery   Procedures/Studies: No results found.      Discharge Exam: Vitals:   02/27/21 2248 02/28/21 0504  BP: 115/77 (!) 144/90  Pulse: 86 (!) 107  Resp: 20 17  Temp: 97.7 F (36.5 C) 98.3 F (36.8 C)  SpO2: 97% 95%   Vitals:   02/27/21 0630 02/27/21 1318 02/27/21 2248 02/28/21 0504  BP: (!) 152/84 (!) 152/92 115/77 (!) 144/90  Pulse: 92 95 86 (!) 107  Resp: 17 17 20  17  Temp: 98.2 F (36.8 C) 98.3 F (36.8 C) 97.7 F (36.5 C) 98.3 F (36.8 C)  TempSrc:   Oral   SpO2: 94% 96% 97% 95%  Weight:      Height:           Physical Exam:   General:  Alert, oriented, cooperative, no distress;   HEENT:  Normocephalic, PERRL, otherwise with in Normal limits   Neuro:  CNII-XII intact. , normal motor and sensation, reflexes intact   Lungs:   Clear to auscultation BL, Respirations unlabored, no wheezes / crackles  Cardio:    S1/S2, RRR, No murmure, No Rubs or Gallops   Abdomen:   Soft, non-tender, bowel sounds active all four quadrants,  no guarding or peritoneal signs.  Muscular skeletal:  Limited exam - in bed, able to move all 4 extremities, Normal strength,  2+ pulses,  symmetric, No pitting edema  Skin:  Dry, warm to  touch, negative for any Rashes, lower extremity toenails deformed, excessively dry skin in her lower extremities with left second toe tip dry gangrene  Wounds: Please see nursing documentation           The results of significant diagnostics from this hospitalization (including imaging, microbiology, ancillary and laboratory) are listed below for reference.    Significant Diagnostic Studies: No results found.  Microbiology: Recent Results (from the past 240 hour(s))  Resp Panel by RT-PCR (Flu A&B, Covid) Nasopharyngeal Swab     Status: None   Collection Time: 02/21/21  4:45 PM   Specimen: Nasopharyngeal Swab; Nasopharyngeal(NP) swabs in vial transport medium  Result Value Ref Range Status   SARS Coronavirus 2 by RT PCR NEGATIVE NEGATIVE Final    Comment: (NOTE) SARS-CoV-2 target nucleic acids are NOT DETECTED.  The SARS-CoV-2 RNA is generally detectable in upper respiratory specimens during the acute phase of infection. The lowest concentration of SARS-CoV-2 viral copies this assay can detect is 138 copies/mL. A negative result does not preclude SARS-Cov-2 infection and should not be used as the sole basis for treatment or other patient management decisions. A negative result may occur with  improper specimen collection/handling, submission of specimen other than nasopharyngeal swab, presence of viral mutation(s) within the areas targeted by this assay, and inadequate number of viral copies(<138 copies/mL). A negative result must be combined with clinical observations, patient history, and epidemiological information. The expected result is Negative.  Fact Sheet for Patients:  EntrepreneurPulse.com.au  Fact Sheet for Healthcare Providers:  IncredibleEmployment.be  This test is no t yet approved or cleared by the [place] FDA and  has been authorized for detection and/or diagnosis of SARS-CoV-2 by FDA under an Emergency Use  Authorization (EUA). This EUA will remain  in effect (meaning this test can be used) for the duration of the COVID-19 declaration under Section 564(b)(1) of the Act, 21 U.S.C.section 360bbb-3(b)(1), unless the authorization is terminated  or revoked sooner.       Influenza A by PCR NEGATIVE NEGATIVE Final   Influenza B by PCR NEGATIVE NEGATIVE Final    Comment: (NOTE) The Xpert Xpress SARS-CoV-2/FLU/RSV plus assay is intended as an aid in the diagnosis of influenza from Nasopharyngeal swab specimens and should not be used as a sole basis for treatment. Nasal washings and aspirates are unacceptable for Xpert Xpress SARS-CoV-2/FLU/RSV testing.  Fact Sheet for Patients: EntrepreneurPulse.com.au  Fact Sheet for Healthcare Providers: IncredibleEmployment.be  This test is not yet approved or cleared by the [place] and has been authorized  for detection and/or diagnosis of SARS-CoV-2 by FDA under an Emergency Use Authorization (EUA). This EUA will remain in effect (meaning this test can be used) for the duration of the COVID-19 declaration under Section 564(b)(1) of the Act, 21 U.S.C. section 360bbb-3(b)(1), unless the authorization is terminated or revoked.  Performed at [facility], [address] [place], [place] [address]      Labs: All reviewed personally    Signed:  Deatra James, MD  [facility] Pager: [phone] 02/28/2021, 11:13 AM

## 2021-02-28 NOTE — Plan of Care (Signed)

## 2021-03-01 NOTE — TOC Progression Note (Signed)
Transition of Care [facility]) - Progression Note    Patient Details  Name: Randeep Biondolillo MRN: [id number] Date of Birth: 05/13/1960  [facility] CM/SW Contact  Karn Cassis, [place] Phone Number: 03/01/2021, 3:28 PM  Clinical Narrative:  LCSW received call from admissions at [facility] who report they are unable to admit at this time and it may be several weeks. Discussed with supervisor and pt is appropriate for [name]. LCSW referred to [facility] and spoke with Damian Leavell about pt. Trudy agreeable to assess pt tomorrow. TOC will continue to follow.      Expected Discharge Plan: Skilled Nursing Facility Barriers to Discharge: Other (must enter comment) (no bed offers)  Expected Discharge Plan and Services Expected Discharge Plan: Skilled Nursing Facility       Living arrangements for the past 2 months: Assisted Living Facility Expected Discharge Date: 01/26/21                                     Social Determinants of Health (SDOH) Interventions    Readmission Risk Interventions Readmission Risk Prevention Plan 01/22/2021  Transportation Screening Complete  HRI or Home Care Consult Complete  Social Work Consult for Recovery Care Planning/Counseling Complete  Palliative Care Screening Not Applicable  Medication Review [employer]) Complete  Some recent data might be hidden

## 2021-03-01 NOTE — Discharge Summary (Signed)
Pending  Physician Discharge Summary  [name] [name] SAY:[id number] DOB: 30-Jul-1960 DOA: 01/20/2021  PCP: [name] Mc, NP  Admit date: 01/20/2021 Discharge date: 03/01/2021  Admitted From: [name] Disposition:  SNF  Recommendations for Outpatient Follow-up:  Follow up with PCP in 1-2 weeks Please obtain BMP/CBC in one week  Discharge Condition: Stable CODE STATUS:FULL Diet recommendation: Regular  The patient was seen and examined, stable no acute distress... No changes pending discharge.  -SNF facility -[name] has a COVID outbreak, requesting to withhold transfer for now...  Brief/Interim Summary: 60 y.o. male with medical history of MDD with psychosis, seizure disorder, hypertension, tobacco abuse, cognitive impairment, atrial flutter with RVR, chronic hepatitis C, history of cocaine and alcohol abuse presenting due to abnormality with his left second toe.  The patient is a poor historian.  When asked why he is in the emergency department, he states " they tell me there is something wrong with my foot".  Apparently, the patient was getting his toenails trimmed at his assisted living facility [name]) when it was noticed there was some abnormality and discoloration of his left second toe.  As result, the patient was sent to the emergency department for further evaluation.  The patient himself denies any pain or discomfort about his second toe.  He is unable to provide any history regarding recent injury or trauma.  He states, " I did not there was something wrong with my toe."  He denies any fevers, chills, chest pain, shortness breath, cough, hemoptysis, nausea, vomiting, diarrhea, domino pain, foot pain.  He continues to smoke tobacco.   ED In the emergency department, the patient was afebrile hemodynamically stable with oxygen saturation 100% room air.  BMP showed a sodium 141, potassium 3.9, serum creatinine 1.04.  WBC 7.1, hemoglobin 14.9, platelets 124,000.  Patient was given a  dose of vancomycin.  X-ray of the left foot showed soft tissue swelling with erosion of the distal and middle phalanx of the left second toe.  Admission was requested for further work-up and treatment.   -MR left foot--acute osteomyelitis middle and distal phalanges 2nd toe.  General surgery recommended nonoperative management.  A PICC line was placed and the patient receive 4 weeks of IV cubicin and ceftriaxone.  His toe gradually improved.  His IV abx will be followed with 1 week po abx.  His disposition was difficult as his [name] was not willing to take him back.  Ultimately, he finished his IV antibiotics in the hospital.  During his month in the hospital, he did not have any major behavioral issues or aggressive behavior.  He was continued on his usual baseline bipolar meds without complications.  Discharge Diagnoses:  Cellulitis left second toe/Acute osteomyelitis Much improved hemodynamically stable -Completed full dose of IV and p.o. antibiotics  -ESR--5>>5, CRP--0.7>>0.6 -Check ABIs--left = 1.10; right = 1.09 -Patient has palpable dorsalis pedis pulses bilateral -Surgery consult noted>>nonoperative management -Continue vancomycin>>changed to daptomycin -Completed IV antibiotics of ceftriaxone -MR left foot--acute osteomyelitis middle and distal phalanges 2nd toe -PICC line placed 11/3>>removed prior to discharge..  Discontinued -4 weeks IV abx--last day 02/17/21 -cefdinir and doxy -last dose 02/25/2021  Bipolar disorder-Continue quetiapine, sertraline, trazodone, hydroxyzine -Remained stable no behavior changes  Atrial flutter -continue apixaban, metoprolol succinate, -Discontinue Diltiazem due to bradycardia   Seizure disorder -Continue lamotrigine, Keppra, Vimpat   Tobacco abuse-Tobacco cessation discussed   Thrombocytopenia -Likely secondary to chronic hepatitis C -Stable no signs of bleeding   Chronic hepatitis C -Appears clinically  Discharge Instructions -stable to  be discharged to SNF when bed available   Allergies as of 03/01/2021       Reactions   Carbamazepine Other (See Comments)   Hyponatremia   Depakote [divalproex Sodium] Other (See Comments)   Unknown - on MAR        Medication List     STOP taking these medications    diltiazem 90 MG tablet Commonly known as: CARDIZEM   loperamide 2 MG tablet Commonly known as: IMODIUM A-D   ondansetron 4 MG tablet Commonly known as: ZOFRAN   traZODone 150 MG tablet Commonly known as: DESYREL       TAKE these medications    apixaban 5 MG Tabs tablet Commonly known as: ELIQUIS Take 5 mg by mouth 2 (two) times daily.   cefdinir 300 MG capsule Commonly known as: OMNICEF Take 1 capsule (300 mg total) by mouth every 12 (twelve) hours. X 4 days   cholecalciferol 25 MCG (1000 UNIT) tablet Commonly known as: VITAMIN D Take 1,000 Units by mouth daily.   doxycycline 100 MG tablet Commonly known as: VIBRA-TABS Take 1 tablet (100 mg total) by mouth every 12 (twelve) hours. X 4 days   hydrOXYzine 25 MG capsule Commonly known as: VISTARIL Take 25 mg by mouth in the morning and at bedtime.   hydrOXYzine 50 MG capsule Commonly known as: VISTARIL Take 100 mg by mouth daily as needed (agitation).   lactobacillus Pack Take 1 packet (1 g total) by mouth 3 (three) times daily with meals.   lamoTRIgine 25 MG tablet Commonly known as: LAMICTAL Take 25 mg by mouth daily.   Latanoprost 0.005 % Emul Place 1 drop into both eyes at bedtime.   levETIRAcetam 750 MG tablet Commonly known as: KEPPRA Take 2 tablets (1,500 mg total) by mouth 2 (two) times daily.   metoprolol succinate 25 MG 24 hr tablet Commonly known as: TOPROL-XL Take 25 mg by mouth daily.   QUEtiapine 200 MG tablet Commonly known as: SEROQUEL Take 200 mg by mouth at bedtime.   QUEtiapine 50 MG tablet Commonly known as: SEROQUEL Take 50 mg by mouth 2 (two) times daily.   sertraline 50 MG tablet Commonly known as:  ZOLOFT Take 50 mg by mouth daily.   SUMAtriptan 50 MG tablet Commonly known as: IMITREX Take 1 tablet (50 mg total) by mouth See admin instructions. Give 1 tablet (50 mg) by mouth every 24 hours as needed for migraine headache;  May repeat in 2 hours if headache persists or recurs. Do not exceed 100 mg in 24 hours What changed:  when to take this reasons to take this additional instructions   Vimpat 150 MG Tabs Generic drug: Lacosamide Take 1 tablet (150 mg total) by mouth 2 (two) times daily.        Allergies  Allergen Reactions   Carbamazepine Other (See Comments)    Hyponatremia   Depakote [Divalproex Sodium] Other (See Comments)    Unknown - on MAR    Consultations: General surgery   Procedures/Studies: No results found.      Discharge Exam: Vitals:   03/01/21 0443 03/01/21 0757  BP: 106/90 134/90  Pulse: 79 79  Resp: 18   Temp: 98.2 F (36.8 C)   SpO2: 95% 100%   Vitals:   02/28/21 1337 02/28/21 2205 03/01/21 0443 03/01/21 0757  BP: 130/82 120/83 106/90 134/90  Pulse: 86 68 79 79  Resp: 17 20 18    Temp: 98.3 F (36.8 C) 98.7  F (37.1 C) 98.2 F (36.8 C)   TempSrc:  Oral Oral   SpO2: 97% 96% 95% 100%  Weight:      Height:             Physical Exam:   General:  Alert, oriented, cooperative, no distress;   HEENT:  Normocephalic, PERRL, otherwise with in Normal limits   Neuro:  CNII-XII intact. , normal motor and sensation, reflexes intact   Lungs:   Clear to auscultation BL, Respirations unlabored, no wheezes / crackles  Cardio:    S1/S2, RRR, No murmure, No Rubs or Gallops   Abdomen:   Soft, non-tender, bowel sounds active all four quadrants,  no guarding or peritoneal signs.  Muscular skeletal:  Limited exam - in bed, able to move all 4 extremities, Normal strength,  2+ pulses,  symmetric, No pitting edema  Skin:  Dry, warm to touch, negative for any Rashes, Lower extremity toenails deformed, excessively dry skin in her lower  extremities with left second toe tip dry gangrene  Wounds: Please see nursing documentation         The results of significant diagnostics from this hospitalization (including imaging, microbiology, ancillary and laboratory) are listed below for reference.    Significant Diagnostic Studies: No results found.  Microbiology: Recent Results (from the past 240 hour(s))  Resp Panel by RT-PCR (Flu A&B, Covid) Nasopharyngeal Swab     Status: None   Collection Time: 02/21/21  4:45 PM   Specimen: Nasopharyngeal Swab; Nasopharyngeal(NP) swabs in vial transport medium  Result Value Ref Range Status   SARS Coronavirus 2 by RT PCR NEGATIVE NEGATIVE Final    Comment: (NOTE) SARS-CoV-2 target nucleic acids are NOT DETECTED.  The SARS-CoV-2 RNA is generally detectable in upper respiratory specimens during the acute phase of infection. The lowest concentration of SARS-CoV-2 viral copies this assay can detect is 138 copies/mL. A negative result does not preclude SARS-Cov-2 infection and should not be used as the sole basis for treatment or other patient management decisions. A negative result may occur with  improper specimen collection/handling, submission of specimen other than nasopharyngeal swab, presence of viral mutation(s) within the areas targeted by this assay, and inadequate number of viral copies(<138 copies/mL). A negative result must be combined with clinical observations, patient history, and epidemiological information. The expected result is Negative.  Fact Sheet for Patients:  EntrepreneurPulse.com.au  Fact Sheet for Healthcare Providers:  IncredibleEmployment.be  This test is no t yet approved or cleared by the [place] FDA and  has been authorized for detection and/or diagnosis of SARS-CoV-2 by FDA under an Emergency Use Authorization (EUA). This EUA will remain  in effect (meaning this test can be used) for the duration of  the COVID-19 declaration under Section 564(b)(1) of the Act, 21 U.S.C.section 360bbb-3(b)(1), unless the authorization is terminated  or revoked sooner.       Influenza A by PCR NEGATIVE NEGATIVE Final   Influenza B by PCR NEGATIVE NEGATIVE Final    Comment: (NOTE) The Xpert Xpress SARS-CoV-2/FLU/RSV plus assay is intended as an aid in the diagnosis of influenza from Nasopharyngeal swab specimens and should not be used as a sole basis for treatment. Nasal washings and aspirates are unacceptable for Xpert Xpress SARS-CoV-2/FLU/RSV testing.  Fact Sheet for Patients: EntrepreneurPulse.com.au  Fact Sheet for Healthcare Providers: IncredibleEmployment.be  This test is not yet approved or cleared by the [place] FDA and has been authorized for detection and/or diagnosis of SARS-CoV-2 by FDA under an Emergency  Use Authorization (EUA). This EUA will remain in effect (meaning this test can be used) for the duration of the COVID-19 declaration under Section 564(b)(1) of the Act, 21 U.S.C. section 360bbb-3(b)(1), unless the authorization is terminated or revoked.  Performed at [facility], [address], [place], [place] [address]      Labs: All reviewed personally    Signed:  Deatra James, MD  [facility] Pager: [phone] 03/01/2021, 11:02 AM

## 2021-03-02 NOTE — Plan of Care (Signed)

## 2021-03-02 NOTE — Discharge Summary (Signed)
Pending  Physician Discharge Summary  [name] [name] CLE:[id number] DOB: 02/05/1961 DOA: 01/20/2021  PCP: [name], NP  Admit date: 01/20/2021 Discharge date: 03/02/2021  Admitted From: [name] Disposition:  SNF  Recommendations for Outpatient Follow-up:  Follow up with PCP in 1-2 weeks Please obtain BMP/CBC in one week  Discharge Condition: Stable CODE STATUS:FULL Diet recommendation: Regular  The patient was seen and examined this morning, remained stable, ready for discharge  -SNF facility -[facility] has a COVID outbreak, requesting to withhold transfer for now...  Brief/Interim Summary: 60 y.o. male with medical history of MDD with psychosis, seizure disorder, hypertension, tobacco abuse, cognitive impairment, atrial flutter with RVR, chronic hepatitis C, history of cocaine and alcohol abuse presenting due to abnormality with his left second toe.  The patient is a poor historian.  When asked why he is in the emergency department, he states " they tell me there is something wrong with my foot".  Apparently, the patient was getting his toenails trimmed at his assisted living facility [facility]) when it was noticed there was some abnormality and discoloration of his left second toe.  As result, the patient was sent to the emergency department for further evaluation.  The patient himself denies any pain or discomfort about his second toe.  He is unable to provide any history regarding recent injury or trauma.  He states, " I did not there was something wrong with my toe."  He denies any fevers, chills, chest pain, shortness breath, cough, hemoptysis, nausea, vomiting, diarrhea, domino pain, foot pain.  He continues to smoke tobacco.   ED In the emergency department, the patient was afebrile hemodynamically stable with oxygen saturation 100% room air.  BMP showed a sodium 141, potassium 3.9, serum creatinine 1.04.  WBC 7.1, hemoglobin 14.9, platelets 124,000.  Patient was given a dose of  vancomycin.  X-ray of the left foot showed soft tissue swelling with erosion of the distal and middle phalanx of the left second toe.  Admission was requested for further work-up and treatment.   -MR left foot--acute osteomyelitis middle and distal phalanges 2nd toe.  General surgery recommended nonoperative management.  A PICC line was placed and the patient receive 4 weeks of IV cubicin and ceftriaxone.  His toe gradually improved.  His IV abx will be followed with 1 week po abx.  His disposition was difficult as his [name] was not willing to take him back.  Ultimately, he finished his IV antibiotics in the hospital.  During his month in the hospital, he did not have any major behavioral issues or aggressive behavior.  He was continued on his usual baseline bipolar meds without complications.  Discharge Diagnoses:  Cellulitis left second toe/Acute osteomyelitis Much improved, hemodynamically stable now -Completed full dose of IV and p.o. antibiotics--all completed  -ESR--5>>5, CRP--0.7>>0.6 -Check ABIs--left = 1.10; right = 1.09 -Patient has palpable dorsalis pedis pulses bilateral -Surgery consult noted>>nonoperative management -Continue vancomycin>>changed to daptomycin -Completed IV antibiotics of ceftriaxone -MR left foot--acute osteomyelitis middle and distal phalanges 2nd toe -PICC line placed 11/3>>removed prior to discharge..  Discontinued -4 weeks IV abx--last day 02/17/21 -cefdinir and doxy -last dose 02/25/2021  Bipolar disorder-Continue quetiapine, sertraline, trazodone, hydroxyzine -Remained stable no behavior changes  Atrial flutter -continue apixaban, metoprolol succinate, -Discontinue Diltiazem due to bradycardia   Seizure disorder -Continue lamotrigine, Keppra, Vimpat   Tobacco abuse-Tobacco cessation discussed   Thrombocytopenia -Likely secondary to chronic hepatitis C -Stable no signs of bleeding   Chronic hepatitis C -Appears clinically  Discharge  Instructions -stable to be discharged to SNF when bed available   Allergies as of 03/02/2021       Reactions   Carbamazepine Other (See Comments)   Hyponatremia   Depakote [divalproex Sodium] Other (See Comments)   Unknown - on MAR        Medication List     STOP taking these medications    diltiazem 90 MG tablet Commonly known as: CARDIZEM   loperamide 2 MG tablet Commonly known as: IMODIUM A-D   ondansetron 4 MG tablet Commonly known as: ZOFRAN   traZODone 150 MG tablet Commonly known as: DESYREL       TAKE these medications    apixaban 5 MG Tabs tablet Commonly known as: ELIQUIS Take 5 mg by mouth 2 (two) times daily.   cefdinir 300 MG capsule Commonly known as: OMNICEF Take 1 capsule (300 mg total) by mouth every 12 (twelve) hours. X 4 days   cholecalciferol 25 MCG (1000 UNIT) tablet Commonly known as: VITAMIN D Take 1,000 Units by mouth daily.   doxycycline 100 MG tablet Commonly known as: VIBRA-TABS Take 1 tablet (100 mg total) by mouth every 12 (twelve) hours. X 4 days   hydrOXYzine 25 MG capsule Commonly known as: VISTARIL Take 25 mg by mouth in the morning and at bedtime.   hydrOXYzine 50 MG capsule Commonly known as: VISTARIL Take 100 mg by mouth daily as needed (agitation).   lactobacillus Pack Take 1 packet (1 g total) by mouth 3 (three) times daily with meals.   lamoTRIgine 25 MG tablet Commonly known as: LAMICTAL Take 25 mg by mouth daily.   Latanoprost 0.005 % Emul Place 1 drop into both eyes at bedtime.   levETIRAcetam 750 MG tablet Commonly known as: KEPPRA Take 2 tablets (1,500 mg total) by mouth 2 (two) times daily.   metoprolol succinate 25 MG 24 hr tablet Commonly known as: TOPROL-XL Take 25 mg by mouth daily.   QUEtiapine 200 MG tablet Commonly known as: SEROQUEL Take 200 mg by mouth at bedtime.   QUEtiapine 50 MG tablet Commonly known as: SEROQUEL Take 50 mg by mouth 2 (two) times daily.   sertraline 50 MG  tablet Commonly known as: ZOLOFT Take 50 mg by mouth daily.   SUMAtriptan 50 MG tablet Commonly known as: IMITREX Take 1 tablet (50 mg total) by mouth See admin instructions. Give 1 tablet (50 mg) by mouth every 24 hours as needed for migraine headache;  May repeat in 2 hours if headache persists or recurs. Do not exceed 100 mg in 24 hours What changed:  when to take this reasons to take this additional instructions   Vimpat 150 MG Tabs Generic drug: Lacosamide Take 1 tablet (150 mg total) by mouth 2 (two) times daily.        Allergies  Allergen Reactions   Carbamazepine Other (See Comments)    Hyponatremia   Depakote [Divalproex Sodium] Other (See Comments)    Unknown - on MAR    Consultations: General surgery   Procedures/Studies: No results found.      Discharge Exam: Vitals:   03/01/21 2031 03/02/21 0449  BP: 101/69 107/71  Pulse: 82 91  Resp: 19 19  Temp: 98 F (36.7 C) 98.4 F (36.9 C)  SpO2: 96% 95%   Vitals:   03/01/21 0757 03/01/21 1346 03/01/21 2031 03/02/21 0449  BP: 134/90 110/72 101/69 107/71  Pulse: 79 70 82 91  Resp:  18 19 19   Temp:  98.6  F (37 C) 98 F (36.7 C) 98.4 F (36.9 C)  TempSrc:  Oral Oral Oral  SpO2: 100% 96% 96% 95%  Weight:      Height:         Physical Exam:   General:  Alert, oriented, cooperative, no distress;   HEENT:  Normocephalic, PERRL, otherwise with in Normal limits   Neuro:  CNII-XII intact. , normal motor and sensation, reflexes intact   Lungs:   Clear to auscultation BL, Respirations unlabored, no wheezes / crackles  Cardio:    S1/S2, RRR, No murmure, No Rubs or Gallops   Abdomen:   Soft, non-tender, bowel sounds active all four quadrants,  no guarding or peritoneal signs.  Muscular skeletal:  Limited exam - in bed, able to move all 4 extremities, Normal strength,  2+ pulses,  symmetric, No pitting edema  Skin:  Dry, warm to touch, negative for any Rashes, Lower extremity toenails deformed,  excessively dry skin in her lower extremities with left second toe tip dry gangrene  Wounds: Please see nursing documentation         The results of significant diagnostics from this hospitalization (including imaging, microbiology, ancillary and laboratory) are listed below for reference.    Significant Diagnostic Studies: No results found.  Microbiology: Recent Results (from the past 240 hour(s))  Resp Panel by RT-PCR (Flu A&B, Covid) Nasopharyngeal Swab     Status: None   Collection Time: 02/21/21  4:45 PM   Specimen: Nasopharyngeal Swab; Nasopharyngeal(NP) swabs in vial transport medium  Result Value Ref Range Status   SARS Coronavirus 2 by RT PCR NEGATIVE NEGATIVE Final    Comment: (NOTE) SARS-CoV-2 target nucleic acids are NOT DETECTED.  The SARS-CoV-2 RNA is generally detectable in upper respiratory specimens during the acute phase of infection. The lowest concentration of SARS-CoV-2 viral copies this assay can detect is 138 copies/mL. A negative result does not preclude SARS-Cov-2 infection and should not be used as the sole basis for treatment or other patient management decisions. A negative result may occur with  improper specimen collection/handling, submission of specimen other than nasopharyngeal swab, presence of viral mutation(s) within the areas targeted by this assay, and inadequate number of viral copies(<138 copies/mL). A negative result must be combined with clinical observations, patient history, and epidemiological information. The expected result is Negative.  Fact Sheet for Patients:  EntrepreneurPulse.com.au  Fact Sheet for Healthcare Providers:  IncredibleEmployment.be  This test is no t yet approved or cleared by the [place] FDA and  has been authorized for detection and/or diagnosis of SARS-CoV-2 by FDA under an Emergency Use Authorization (EUA). This EUA will remain  in effect (meaning this test can be  used) for the duration of the COVID-19 declaration under Section 564(b)(1) of the Act, 21 U.S.C.section 360bbb-3(b)(1), unless the authorization is terminated  or revoked sooner.       Influenza A by PCR NEGATIVE NEGATIVE Final   Influenza B by PCR NEGATIVE NEGATIVE Final    Comment: (NOTE) The Xpert Xpress SARS-CoV-2/FLU/RSV plus assay is intended as an aid in the diagnosis of influenza from Nasopharyngeal swab specimens and should not be used as a sole basis for treatment. Nasal washings and aspirates are unacceptable for Xpert Xpress SARS-CoV-2/FLU/RSV testing.  Fact Sheet for Patients: EntrepreneurPulse.com.au  Fact Sheet for Healthcare Providers: IncredibleEmployment.be  This test is not yet approved or cleared by the [place] FDA and has been authorized for detection and/or diagnosis of SARS-CoV-2 by FDA under an Emergency Use  Authorization (EUA). This EUA will remain in effect (meaning this test can be used) for the duration of the COVID-19 declaration under Section 564(b)(1) of the Act, 21 U.S.C. section 360bbb-3(b)(1), unless the authorization is terminated or revoked.  Performed at [facility], [address] [place], [place] [address]      Labs: All reviewed personally    Signed:  Deatra James, MD  [facility] Pager: [phone] 03/02/2021, 12:21 PM

## 2021-03-03 MED ORDER — COVID-19MRNA BIVAL VAC MODERNA 50 MCG/0.5ML IM SUSP
0.5000 mL | Freq: Once | INTRAMUSCULAR | Status: AC
  Administered 2021-03-03: 13:00:00 0.5 mL via INTRAMUSCULAR
  Filled 2021-03-03: qty 0.5

## 2021-03-03 NOTE — TOC Progression Note (Signed)
Transition of Care [facility]) - Progression Note    Patient Details  Name: [name] [name] MRN: [id number] Date of Birth: 1960-05-10  [facility] CM/SW Contact  Leitha Bleak, RN Phone Number: 03/03/2021, 4:45 PM  Clinical Narrative:   [facility], requested Covid booster, that was given. A date has been set for guardianship. TOC can not sign patient into a facility. TOC contacted [name] with DSS to explain that we have a bed offer. She with call a judge in the morning and get an order to take interim guardianship so they can sign him into the SNF.  TOC to follow.   Expected Discharge Plan: Skilled Nursing Facility Barriers to Discharge: (P) Other (must enter comment) (No bed offers)  Expected Discharge Plan and Services Expected Discharge Plan: Skilled Nursing Facility     Living arrangements for the past 2 months: Assisted Living Facility Expected Discharge Date: 01/26/21                   Readmission Risk Interventions Readmission Risk Prevention Plan 01/22/2021  Transportation Screening Complete  HRI or Home Care Consult Complete  Social Work Consult for Recovery Care Planning/Counseling Complete  Palliative Care Screening Not Applicable  Medication Review [employer]) Complete  Some recent data might be hidden

## 2021-03-03 NOTE — Progress Notes (Signed)
PROGRESS NOTE     [name] [name], is a 60 y.o. male, DOB - [birth year], ION:[id number]  Admit date - 01/20/2021   Admitting Physician Orson Eva, MD  Outpatient Primary MD for the patient is [name], NP  LOS - [phone]  Chief Complaint  Patient presents with   Toe Pain        Brief Narrative:  60 y.o. male with medical history of MDD with psychosis, seizure disorder, hypertension, tobacco abuse, cognitive impairment, atrial flutter with RVR, chronic hepatitis C, history of cocaine and alcohol abuse presenting due to abnormality with his left second toe.  The patient is a poor historian.  When asked why he is in the emergency department, he states " they tell me there is something wrong with my foot".  Apparently, the patient was getting his toenails trimmed at his assisted living facility [facility]) when it was noticed there was some abnormality and discoloration of his left second toe.  As result, the patient was sent to the emergency department for further evaluation.  The patient himself denies any pain or discomfort about his second toe.  He is unable to provide any history regarding recent injury or trauma.  He states, " I did not there was something wrong with my toe."  He denies any fevers, chills, chest pain, shortness breath, cough, hemoptysis, nausea, vomiting, diarrhea, domino pain, foot pain.  He continues to smoke tobacco - -MR left foot--acute osteomyelitis middle and distal phalanges 2nd toe.  General surgery recommended nonoperative management.  A PICC line was placed and the patient receive 4 weeks of IV cubicin and ceftriaxone.  His toe gradually improved.  His IV abx will be followed with 1 week po abx.  His disposition was difficult as his [name] was not willing to take him back.  Ultimately, he finished his IV antibiotics in the hospital.  During his month in the hospital, he did not have any major behavioral issues or aggressive behavior.  He was continued on his usual baseline  bipolar meds without complications.  Assessment & Plan:   Active Problems:   Smoking   Bipolar disorder ([place])   Acute osteomyelitis of toe (HCC)   Osteomyelitis of second toe of left foot ([place])   Peripheral vascular disease (HCC)   Cellulitis left second toe/Acute osteomyelitis Much improved, hemodynamically stable now -Completed full dose of IV and p.o. antibiotics--all completed   -ESR--5>>5, CRP--0.7>>0.6 -Check ABIs--left = 1.10; right = 1.09 -Patient has palpable dorsalis pedis pulses bilateral -Surgery consult noted>>nonoperative management -Continue vancomycin>>changed to daptomycin -Completed IV antibiotics of ceftriaxone -MR left foot--acute osteomyelitis middle and distal phalanges 2nd toe -PICC line placed 11/3>>removed prior to discharge..  Discontinued -4 weeks IV abx--last day 02/17/21 -cefdinir and doxy -last dose 02/25/2021   Bipolar disorder-Continue quetiapine, sertraline, trazodone, hydroxyzine -Remained stable no behavior changes  Atrial flutter -continue apixaban, metoprolol succinate, -Discontinue Diltiazem due to bradycardia     Seizure disorder -Continue lamotrigine, Keppra, Vimpat   Tobacco abuse-Tobacco cessation discussed   Thrombocytopenia -Likely secondary to chronic hepatitis C -Stable no signs of bleeding   Chronic hepatitis C -Appears clinically     Disposition/Need for in-Hospital Stay-patient remains medically stable for discharge to SNF once social/legal logistics can be worked out patient unable to be discharged at this time due to --[facility] accepted, requested Covid booster, that was given. A date has been set for guardianship. TOC can not sign patient into a facility. TOC contacted Morey Hummingbird with DSS to explain that we  have a bed offer. She with call a judge in the morning and get an order to take interim guardianship so they can sign him into the SNF.    Status is: Inpatient  Remains inpatient appropriate because: See  disposition above   Code Status :  -  Code Status: Full Code   Family Communication: Legal guardian updated Consults  :  na  DVT Prophylaxis  :   - SCDs    apixaban (ELIQUIS) tablet 5 mg    Lab Results  Component Value Date   PLT 129 (L) 02/18/2021    Inpatient Medications  Scheduled Meds:  apixaban  5 mg Oral BID   hydrOXYzine  25 mg Oral BID   lacosamide  150 mg Oral BID   lamoTRIgine  25 mg Oral Daily   levETIRAcetam  1,500 mg Oral BID   melatonin  6 mg Oral QHS   metoprolol succinate  25 mg Oral Daily   nicotine  14 mg Transdermal Daily   QUEtiapine  50 mg Oral BID   And   QUEtiapine  200 mg Oral QHS   Continuous Infusions: PRN Meds:.acetaminophen **OR** acetaminophen, haloperidol lactate   Anti-infectives (From admission, onward)    Start     Dose/Rate Route Frequency Ordered Stop   02/23/21 0000  cefdinir (OMNICEF) 300 MG capsule        300 mg Oral Every 12 hours 02/23/21 1045     02/23/21 0000  doxycycline (VIBRA-TABS) 100 MG tablet        100 mg Oral Every 12 hours 02/23/21 1045     02/18/21 2200  cefdinir (OMNICEF) capsule 300 mg  Status:  Discontinued        300 mg Oral Every 12 hours 02/18/21 1729 02/28/21 1207   02/18/21 2200  doxycycline (VIBRA-TABS) tablet 100 mg  Status:  Discontinued        100 mg Oral Every 12 hours 02/18/21 1729 02/28/21 1206   02/17/21 2000  DAPTOmycin (CUBICIN) 700 mg in sodium chloride 0.9 % IVPB        700 mg 128 mL/hr over 30 Minutes Intravenous Daily 02/17/21 0907 02/17/21 2203   02/17/21 1330  cefTRIAXone (ROCEPHIN) 2 g in sodium chloride 0.9 % 100 mL IVPB        2 g 200 mL/hr over 30 Minutes Intravenous Every 24 hours 02/17/21 0909 02/17/21 1359   01/25/21 0000  cefTRIAXone 2 g in sodium chloride 0.9 % 100 mL  Status:  Discontinued        2 g Intravenous Every 24 hours 01/24/21 1616 02/21/21    01/24/21 0000  DAPTOmycin 700 mg in sodium chloride 0.9 % 50 mL  Status:  Discontinued        700 mg Intravenous Daily  01/24/21 1616 02/21/21    01/22/21 1600  DAPTOmycin (CUBICIN) 700 mg in sodium chloride 0.9 % IVPB  Status:  Discontinued        700 mg 128 mL/hr over 30 Minutes Intravenous Daily 01/22/21 1248 02/17/21 0907   01/21/21 1330  cefTRIAXone (ROCEPHIN) 2 g in sodium chloride 0.9 % 100 mL IVPB  Status:  Discontinued        2 g 200 mL/hr over 30 Minutes Intravenous Every 24 hours 01/21/21 1238 02/17/21 0909   01/21/21 0030  vancomycin (VANCOREADY) IVPB 1500 mg/300 mL  Status:  Discontinued       See Hyperspace for full Linked Orders Report.   1,500 mg 150 mL/hr over 120 Minutes Intravenous  Every 12 hours 01/20/21 1226 01/22/21 1248   01/20/21 1545  ceFEPIme (MAXIPIME) 2 g in sodium chloride 0.9 % 100 mL IVPB  Status:  Discontinued        2 g 200 mL/hr over 30 Minutes Intravenous Every 8 hours 01/20/21 1540 01/21/21 1238   01/20/21 1230  vancomycin (VANCOREADY) IVPB 2000 mg/400 mL       See Hyperspace for full Linked Orders Report.   2,000 mg 200 mL/hr over 120 Minutes Intravenous  Once 01/20/21 1226 01/20/21 1450         Subjective: Trina Ao today has no fevers, no emesis,  No chest pain,   - Cooperative  Objective: Vitals:   03/02/21 1259 03/02/21 2125 03/03/21 0529 03/03/21 1504  BP: 129/82 (!) 119/95 109/75 120/66  Pulse: 62 81 78 80  Resp: _0 Temp: 98.6 F (37 C) 98.1 F (36.7 C) 98.1 F (36.7 C) 98.2 F (36.8 C)  TempSrc:      SpO2: 95% 97% 93% 95%  Weight:      Height:        Intake/Output Summary (Last 24 hours) at 03/03/2021 1757 Last data filed at 03/03/2021 1300 Gross per 24 hour  Intake 1320 ml  Output 3300 ml  Net -1980 ml   Filed Weights   01/20/21 1219 01/20/21 1944  Weight: 112.5 kg 106.5 kg     Physical Exam  Gen:- Awake Alert,  in no apparent distress  HEENT:- [place].AT, No sclera icterus Neck-Supple Neck,No JVD,.  Lungs-  CTAB , fair symmetrical air movement CV- S1, S2 normal, regular  Abd-  +ve B.Sounds, Abd Soft, No  tenderness,    Extremity/Skin:- No  edema, pedal pulses present  Psych-underlying/baseline cognitive and memory deficits Neuro-no new focal deficits, no tremors MSK-left second toe with eschar  Data Reviewed: I have personally reviewed following labs and imaging studies  CBC: No results for input(s): WBC, NEUTROABS, HGB, HCT, MCV, PLT in the last 168 hours. Basic Metabolic Panel: No results for input(s): NA, K, CL, CO2, GLUCOSE, BUN, CREATININE, CALCIUM, MG, PHOS in the last 168 hours. GFR: Estimated Creatinine Clearance: 120.7 mL/min (by C-G formula based on SCr of 0.8 mg/dL). Liver Function Tests: No results for input(s): AST, ALT, ALKPHOS, BILITOT, PROT, ALBUMIN in the last 168 hours. No results for input(s): LIPASE, AMYLASE in the last 168 hours. No results for input(s): AMMONIA in the last 168 hours. Coagulation Profile: No results for input(s): INR, PROTIME in the last 168 hours. Cardiac Enzymes: No results for input(s): CKTOTAL, CKMB, CKMBINDEX, TROPONINI in the last 168 hours. BNP (last 3 results) No results for input(s): PROBNP in the last 8760 hours. HbA1C: No results for input(s): HGBA1C in the last 72 hours. CBG: No results for input(s): GLUCAP in the last 168 hours. Lipid Profile: No results for input(s): CHOL, HDL, LDLCALC, TRIG, CHOLHDL, LDLDIRECT in the last 72 hours. Thyroid Function Tests: No results for input(s): TSH, T4TOTAL, FREET4, T3FREE, THYROIDAB in the last 72 hours. Anemia Panel: No results for input(s): VITAMINB12, FOLATE, FERRITIN, TIBC, IRON, RETICCTPCT in the last 72 hours. Urine analysis:    Component Value Date/Time   COLORURINE YELLOW 12/12/2017 [place] 12/12/2017 0931   LABSPEC 1.023 12/12/2017 0931   PHURINE 6.0 12/12/2017 0931   GLUCOSEU NEGATIVE 12/12/2017 0931   HGBUR NEGATIVE 12/12/2017 0931   BILIRUBINUR NEGATIVE 12/12/2017 0931   BILIRUBINUR [place] 10/19/2015 1526   KETONESUR NEGATIVE 12/12/2017 0931   PROTEINUR  NEGATIVE  12/12/2017 0931   UROBILINOGEN >=8.0 10/19/2015 1526   UROBILINOGEN 0.2 10/31/2014 1840   NITRITE NEGATIVE 12/12/2017 0931   LEUKOCYTESUR NEGATIVE 12/12/2017 0931   Sepsis Labs: _0 (procalcitonin:4,lacticidven:4)  )No results found for this or any previous visit (from the past 240 hour(s)).    Radiology Studies: No results found.   Scheduled Meds:  apixaban  5 mg Oral BID   hydrOXYzine  25 mg Oral BID   lacosamide  150 mg Oral BID   lamoTRIgine  25 mg Oral Daily   levETIRAcetam  1,500 mg Oral BID   melatonin  6 mg Oral QHS   metoprolol succinate  25 mg Oral Daily   nicotine  14 mg Transdermal Daily   QUEtiapine  50 mg Oral BID   And   QUEtiapine  200 mg Oral QHS   Continuous Infusions:   LOS: 41 days    Roxan Hockey M.D on 03/03/2021 at 5:57 PM  Go to [email or web address] - for contact info  [facility] - Office  [phone]  If 7PM-7AM, please contact night-coverage [email or web address] Password [facility] 03/03/2021, 5:57 PM

## 2021-03-04 LAB — CBC
HCT: 45.2 % (ref 39.0–52.0)
Hemoglobin: 15.3 g/dL (ref 13.0–17.0)
MCH: 31.2 pg (ref 26.0–34.0)
MCHC: 33.8 g/dL (ref 30.0–36.0)
MCV: 92.1 fL (ref 80.0–100.0)
Platelets: 132 10*3/uL — ABNORMAL LOW (ref 150–400)
RBC: 4.91 MIL/uL (ref 4.22–5.81)
RDW: 16.2 % — ABNORMAL HIGH (ref 11.5–15.5)
WBC: 7.1 10*3/uL (ref 4.0–10.5)

## 2021-03-04 LAB — BASIC METABOLIC PANEL
Anion gap: 9 (ref 5–15)
BUN: 9 mg/dL (ref 8–23)
CO2: 23 mmol/L (ref 22–32)
Calcium: 8.3 mg/dL — ABNORMAL LOW (ref 8.9–10.3)
Chloride: 103 mmol/L (ref 98–111)
Creatinine, Ser: 0.76 mg/dL (ref 0.61–1.24)
GFR, Estimated: >60 mL/min (ref >60)
Glucose, Bld: 219 mg/dL — ABNORMAL HIGH (ref 70–99)
Potassium: 4 mmol/L (ref 3.5–5.1)
Sodium: 135 mmol/L (ref 135–145)

## 2021-03-04 MED ORDER — GLIPIZIDE 5 MG PO TABS: 2.5000 mg | ORAL_TABLET | Freq: Every day | ORAL | 11 refills | Status: AC

## 2021-03-04 MED ORDER — NICOTINE 14 MG/24HR TD PT24: 14.0000 mg | MEDICATED_PATCH | Freq: Every day | TRANSDERMAL | 0 refills | Status: AC

## 2021-03-04 MED ORDER — MELATONIN 3 MG PO TABS: 6.0000 mg | ORAL_TABLET | Freq: Every day | ORAL | 1 refills | Status: AC

## 2021-03-04 NOTE — Discharge Summary (Signed)
[name] [name], is a 60 y.o. male  DOB Sep 04, 1960  MRN [id number].  Admission date:  01/20/2021  Admitting Physician  Orson Eva, MD  Discharge Date:  03/04/2021   Primary MD  [name], NP  Recommendations for primary care physician for things to follow:   follow up with Podiatrist advised especially for Lt 2nd Toe    Admission Diagnosis  Cellulitis of second toe of left foot [L03.032] Acute osteomyelitis of toe ([place]) [M86.179] Osteomyelitis of great toe of left foot ([place]) [M86.9] Osteomyelitis of second toe of left foot ([place]) [M86.9]   Discharge Diagnosis  Cellulitis of second toe of left foot [L03.032] Acute osteomyelitis of toe ([place]) [M86.179] Osteomyelitis of great toe of left foot ([place]) [M86.9] Osteomyelitis of second toe of left foot ([place]) [M86.9]    Active Problems:   Smoking   Bipolar disorder ([place])   Acute osteomyelitis of toe ([place])   Osteomyelitis of second toe of left foot ([place])   Peripheral vascular disease ([place])      Past Medical History:  Diagnosis Date   Aneurysm ([place])    Atrial fibrillation with RVR ([place]) 03/03/2017   Dementia ([place])    ETOHism ([place])    Gait abnormality 05/26/2016   Hepatitis C    Hereditary and idiopathic peripheral neuropathy 06/03/2014   Hypertension    Posttraumatic encephalopathy 08/20/2018   Seizure ([place])     Past Surgical History:  Procedure Laterality Date   A-FLUTTER ABLATION N/A 03/03/2017   Procedure: A-FLUTTER ABLATION;  Surgeon: Deboraha Sprang, MD;  Location: [place] [facility];  Service: Cardiovascular;  Laterality: N/A;   ATRIAL FLUTTER ABLATION  03/03/2017   CEREBRAL ANEURYSM REPAIR     At [place] REMOVAL Left 10/29/2012   Procedure: HARDWARE REMOVAL;  Surgeon: Renette Butters, MD;  Location: [place];  Service: Orthopedics;  Laterality: Left;   I & D EXTREMITY Left 10/26/2012   Procedure: IRRIGATION AND  DEBRIDEMENT Left Elbow;  Surgeon: Renette Butters, MD;  Location: [place];  Service: Orthopedics;  Laterality: Left;   I & D EXTREMITY Left 10/29/2012   Procedure: IRRIGATION AND DEBRIDEMENT EXTREMITY, wound vac change, stimulan beads;  Surgeon: Renette Butters, MD;  Location: [place];  Service: Orthopedics;  Laterality: Left;  lateral on bean bag   I & D EXTREMITY Left 11/02/2012   Procedure: IRRIGATION AND DEBRIDEMENT EXTREMITY;  Surgeon: Renette Butters, MD;  Location: [place];  Service: Orthopedics;  Laterality: Left;   INCISION AND DRAINAGE ABSCESS Left 11/02/2012   Procedure: INCISION AND DRAINAGE ABSCESS;  Surgeon: Renette Butters, MD;  Location: [place];  Service: Orthopedics;  Laterality: Left;       HPI  from the history and physical done on the day of admission:    HPI:  [name] [name] is a 60 y.o. male with medical history of MDD with psychosis, seizure disorder, hypertension, tobacco abuse, cognitive impairment, atrial flutter with RVR, chronic hepatitis C, history of cocaine and alcohol abuse presenting due  to abnormality with his left second toe.  The patient is a poor historian.  When asked why he is in the emergency department, he states " they tell me there is something wrong with my foot".  Apparently, the patient was getting his toenails trimmed at his assisted living facility [facility]) when it was noticed there was some abnormality and discoloration of his left second toe.  As result, the patient was sent to the emergency department for further evaluation.  The patient himself denies any pain or discomfort about his second toe.  He is unable to provide any history regarding recent injury or trauma.  He states, " I did not there was something wrong with my toe."  He denies any fevers, chills, chest pain, shortness breath, cough, hemoptysis, nausea, vomiting, diarrhea, domino pain, foot pain.  He continues to smoke tobacco.   ED In the emergency department, the patient was afebrile  hemodynamically stable with oxygen saturation 100% room air.  BMP showed a sodium 141, potassium 3.9, serum creatinine 1.04.  WBC 7.1, hemoglobin 14.9, platelets 124,000.  Patient was given a dose of vancomycin.  X-ray of the left foot showed soft tissue swelling with erosion of the distal and middle phalanx of the left second toe.  Admission was requested for further work-up and treatment.     Hospital Course:     Cellulitis left second toe/Acute Osteomyelitis Much improved, hemodynamically stable now -Completed full dose of IV and oral antibiotics--all completed  -ESR--5>>5, CRP--0.7>>0.6 -Check ABIs--left = 1.10; right = 1.09 -Patient has palpable dorsalis pedis pulses bilateral -Surgery consult noted>>nonoperative management -Continue vancomycin>>changed to daptomycin-Completed -Completed IV antibiotics of ceftriaxone -MR left foot--acute osteomyelitis middle and distal phalanges 2nd toe -PICC line placed 01/21/21>>removed prior to discharge..  - Discontinued -4 weeks IV abx--last day 02/17/21 -cefdinir and doxy -last dose 02/25/2021   Bipolar disorder-Continue quetiapine, sertraline, trazodone, hydroxyzine -Remained stable no behavior changes  Atrial flutter -continue apixaban, metoprolol succinate, -Discontinue Diltiazem due to bradycardia    Seizure disorder -Continue lamotrigine, Keppra, Vimpat   Tobacco abuse-Tobacco cessation discussed   Thrombocytopenia -Likely secondary to chronic hepatitis C -Stable no signs of bleeding   H/o Chronic hepatitis C -Appears clinically stable    Disposition/- discharge to SNF once social/legal logistics can be worked out ---[facility] accepted, requested Covid booster, that was given.  Morey Hummingbird with DSS to explain that we have a bed offer.-- -- interim guardianship received by DSS and  they can sign him into the SNF.      Code Status :  -  Code Status: Full Code    Family Communication: Legal guardian updated Consults  :   na   Discharge Condition: stable  Follow UP   Contact information for after-discharge care     [place] .   Service: Skilled Nursing Contact information: [address] [place] [phone]                     Diet and Activity recommendation:  As advised  Discharge Instructions    Discharge Instructions     Diet - low sodium heart healthy   Complete by: As directed    Discharge instructions   Complete by: As directed    follow up with Podiatrist advised especially for Lt 2nd Toe   Increase activity slowly   Complete by: As directed        Discharge Medications  Allergies as of 03/04/2021       Reactions   Carbamazepine Other (See Comments)   Hyponatremia   Depakote [divalproex Sodium] Other (See Comments)   Unknown - on MAR        Medication List     STOP taking these medications    diltiazem 90 MG tablet Commonly known as: CARDIZEM   loperamide 2 MG tablet Commonly known as: IMODIUM A-D   ondansetron 4 MG tablet Commonly known as: ZOFRAN   traZODone 150 MG tablet Commonly known as: DESYREL       TAKE these medications    apixaban 5 MG Tabs tablet Commonly known as: ELIQUIS Take 5 mg by mouth 2 (two) times daily.   cholecalciferol 25 MCG (1000 UNIT) tablet Commonly known as: VITAMIN D Take 1,000 Units by mouth daily.   glipiZIDE 5 MG tablet Commonly known as: GLUCOTROL Take 0.5 tablets (2.5 mg total) by mouth daily before breakfast. Hold if he does Not eat   hydrOXYzine 25 MG capsule Commonly known as: VISTARIL Take 25 mg by mouth in the morning and at bedtime.   hydrOXYzine 50 MG capsule Commonly known as: VISTARIL Take 100 mg by mouth daily as needed (agitation).   lactobacillus Pack Take 1 packet (1 g total) by mouth 3 (three) times daily with meals.   lamoTRIgine 25 MG tablet Commonly known as: LAMICTAL Take 25 mg by mouth  daily.   Latanoprost 0.005 % Emul Place 1 drop into both eyes at bedtime.   levETIRAcetam 750 MG tablet Commonly known as: KEPPRA Take 2 tablets (1,500 mg total) by mouth 2 (two) times daily.   melatonin 3 MG Tabs tablet Take 2 tablets (6 mg total) by mouth at bedtime.   metoprolol succinate 25 MG 24 hr tablet Commonly known as: TOPROL-XL Take 25 mg by mouth daily.   nicotine 14 mg/24hr patch Commonly known as: NICODERM CQ - dosed in mg/24 hours Place 1 patch (14 mg total) onto the skin daily. Start taking on: March 05, 2021   QUEtiapine 200 MG tablet Commonly known as: SEROQUEL Take 200 mg by mouth at bedtime.   QUEtiapine 50 MG tablet Commonly known as: SEROQUEL Take 50 mg by mouth 2 (two) times daily.   sertraline 50 MG tablet Commonly known as: ZOLOFT Take 50 mg by mouth daily.   SUMAtriptan 50 MG tablet Commonly known as: IMITREX Take 1 tablet (50 mg total) by mouth See admin instructions. Give 1 tablet (50 mg) by mouth every 24 hours as needed for migraine headache;  May repeat in 2 hours if headache persists or recurs. Do not exceed 100 mg in 24 hours What changed:  when to take this reasons to take this additional instructions   Vimpat 150 MG Tabs Generic drug: Lacosamide Take 1 tablet (150 mg total) by mouth 2 (two) times daily.        Major procedures and Radiology Reports - PLEASE review detailed and final reports for all details, in brief -   No results found.  Micro Results   No results found for this or any previous visit (from the past 240 hour(s)).  Today   Subjective    Kevontae Burgoon today has no new complaints  No fever  Or chills   No Nausea, Vomiting or Diarrhea         Patient has been seen and examined prior to discharge   Objective   Blood pressure 112/66, pulse (!) 57, temperature 98.1 F (36.7 C), temperature  source Oral, resp. rate 16, height 6' 5"  (1.956 m), weight 106.5 kg, SpO2 99 %.   Intake/Output Summary  (Last 24 hours) at 03/04/2021 1603 Last data filed at 03/04/2021 1300 Gross per 24 hour  Intake 1440 ml  Output 1750 ml  Net -310 ml   Exam Gen:- Awake Alert,  in no apparent distress  HEENT:- [place].AT, No sclera icterus Neck-Supple Neck,No JVD,.  Lungs-  CTAB , fair symmetrical air movement CV- S1, S2 normal, regular  Abd-  +ve B.Sounds, Abd Soft, No tenderness,    Extremity/Skin:- No  edema, pedal pulses present  Psych-underlying/baseline cognitive and memory deficits Neuro-no new focal deficits, no tremors MSK-left second toe with eschar   Data Review   CBC w Diff:  Lab Results  Component Value Date   WBC 7.1 03/04/2021   HGB 15.3 03/04/2021   HGB 14.3 02/16/2017   HCT 45.2 03/04/2021   HCT 42.9 02/16/2017   PLT 132 (L) 03/04/2021   PLT 176 02/16/2017   LYMPHOPCT 63 01/20/2021   BANDSPCT 0 11/02/2012   MONOPCT 12 01/20/2021   EOSPCT 1 01/20/2021   BASOPCT 0 01/20/2021    CMP:  Lab Results  Component Value Date   NA 135 03/04/2021   NA 147 (H) 02/16/2017   K 4.0 03/04/2021   CL 103 03/04/2021   CO2 23 03/04/2021   BUN 9 03/04/2021   BUN 19 02/16/2017   CREATININE 0.76 03/04/2021   CREATININE 0.87 03/08/2018   PROT 7.1 02/04/2021   PROT 8.4 05/26/2016   ALBUMIN 3.5 02/04/2021   ALBUMIN 4.1 05/26/2016   BILITOT 0.6 02/04/2021   BILITOT 1.0 05/26/2016   ALKPHOS 104 02/04/2021   AST 40 02/04/2021   ALT 40 02/04/2021   ALT 17 03/08/2018  .  Total Discharge time is about 33 minutes  Roxan Hockey M.D on 03/04/2021 at 4:03 PM  Go to [email or web address] -  for contact info  [facility] - Office  [phone]

## 2021-03-04 NOTE — Progress Notes (Signed)
Report called to [place] at [facility] in [place].  Techs taking patient to main entrance in [place] and to be transported by [employer] [facility] [employer].

## 2021-03-04 NOTE — Discharge Instructions (Signed)
follow up with Podiatrist advised especially for Lt 2nd Toe

## 2021-03-04 NOTE — TOC Transition Note (Signed)
Transition of Care [facility]) - CM/SW Discharge Note   Patient Details  Name: [name] [name] MRN: [id number] Date of Birth: [birth year]  Transition of Care [facility]) CM/SW Contact:  Leitha Bleak, RN Phone Number: 03/04/2021, 4:26 PM   Clinical Narrative:   DSS completed interim guardianship and fax papers to [place] admission. Revonda Standard gave the room number, RN called report. [facility] scheduled to pickup.   Final next level of care: Skilled Nursing Facility Barriers to Discharge: Barriers Resolved  Patient Goals and CMS Choice Patient states their goals for this hospitalization and ongoing recovery are:: SNF CMS Medicare.gov Compare Post Acute Care list provided to:: Patient Choice offered to / list presented to : Patient  Discharge Placement              [facility] at:  [facility]) Patient to be transferred to facility by: [facility]   Patient and family notified of of transfer: 03/04/21  Discharge Plan and Services                  Readmission Risk Interventions Readmission Risk Prevention Plan 01/22/2021  Transportation Screening Complete  HRI or Home Care Consult Complete  Social Work Consult for Recovery Care Planning/Counseling Complete  Palliative Care Screening Not Applicable  Medication Review [facility]) Complete  Some recent data might be hidden

## 2023-08-21 ENCOUNTER — Ambulatory Visit: Admitting: Podiatry

## 2023-08-30 ENCOUNTER — Ambulatory Visit: Admitting: Podiatry

## 2023-08-30 ENCOUNTER — Encounter: Payer: Self-pay | Admitting: Podiatry

## 2023-08-30 DIAGNOSIS — L97522 Non-pressure chronic ulcer of other part of left foot with fat layer exposed: Secondary | ICD-10-CM

## 2023-08-30 DIAGNOSIS — I739 Peripheral vascular disease, unspecified: Secondary | ICD-10-CM

## 2023-08-30 NOTE — Patient Instructions (Signed)
  VISIT SUMMARY: You visited us  today for a chronic ulcer on your left foot, which has been present for over a year. We discussed your current treatment and made plans for further evaluation and management.  YOUR PLAN: -CHRONIC LEFT FOOT ULCER: A chronic ulcer is a long-lasting sore that can be difficult to heal. Your ulcer shows no signs of infection but has been present for a long time. We will clean the wound, remove any dead tissue, and order blood tests to check for infection. An MRI will help us  see if there is any deeper tissue involvement. We will also test your circulation to see if poor blood flow is contributing to the ulcer. A pad will be added to your shoe to reduce pressure on the ulcer. Please follow up in three to four weeks for reassessment.    INSTRUCTIONS: Please complete the blood tests (CBC, ESR, and CRP) at [name] or your facility and have the results faxed to us . Schedule an MRI at [facility] (info below) of your foot and circulation testing at the hospital (info below). Follow up with us  in three to four weeks for reassessment.    Call to schedule circulation testing: [address] [place], [place] [address]  Main: [phone]  Call [facility] to schedule your MRI at the below locations.  Please allow at least 1 business day after your visit to process the referral.  It may take longer depending on approval from insurance.  Please let me know if you have issues or problems scheduling the MRI   [facility] [place] [phone] [address] [place] [address] [place], [place] [address]  [facility] [phone] [address] [place], [place] [address]                       Contains text generated by Abridge.                                 Contains text generated by Abridge.

## 2023-08-31 LAB — CBC WITH DIFFERENTIAL/PLATELET
Basophils Absolute: 0 10*3/uL (ref 0.0–0.2)
Basos: 1 %
EOS (ABSOLUTE): 0.1 10*3/uL (ref 0.0–0.4)
Eos: 1 %
Hematocrit: 49 % (ref 37.5–51.0)
Hemoglobin: 16.8 g/dL (ref 13.0–17.7)
Immature Grans (Abs): 0 10*3/uL (ref 0.0–0.1)
Immature Granulocytes: 0 %
Lymphocytes Absolute: 4.7 10*3/uL — ABNORMAL HIGH (ref 0.7–3.1)
Lymphs: 57 %
MCH: 33.4 pg — ABNORMAL HIGH (ref 26.6–33.0)
MCHC: 34.3 g/dL (ref 31.5–35.7)
MCV: 97 fL (ref 79–97)
Monocytes Absolute: 1 10*3/uL — ABNORMAL HIGH (ref 0.1–0.9)
Monocytes: 12 %
Neutrophils Absolute: 2.4 10*3/uL (ref 1.4–7.0)
Neutrophils: 29 %
Platelets: 155 10*3/uL (ref 150–450)
RBC: 5.03 x10E6/uL (ref 4.14–5.80)
RDW: 12 % (ref 11.6–15.4)
WBC: 8.2 10*3/uL (ref 3.4–10.8)

## 2023-08-31 LAB — C-REACTIVE PROTEIN: CRP: <1 mg/L (ref 0–10)

## 2023-08-31 LAB — SEDIMENTATION RATE: Sed Rate: 10 mm/h (ref 0–30)

## 2023-09-03 ENCOUNTER — Encounter: Payer: Self-pay | Admitting: Podiatry

## 2023-09-03 NOTE — Progress Notes (Signed)
  Subjective:  Patient ID: [name] [name], male    DOB: 10-21-1960,  MRN: [id number]  Chief Complaint  Patient presents with   Foot Ulcer    My toe (Ulcer 1st met left)    Discussed the use of AI scribe software for clinical note transcription with the patient, who gave verbal consent to proceed.  History of Present Illness [name] [name] is a 63 year old male who presents with a chronic left foot ulcer.  He has a chronic ulcer on his left foot, present for possibly over a year, though the exact duration is uncertain. The ulcer is managed with a bandage, and a wound care nurse at his facility is involved in its care.  He has a history of circulation issues and is a smoker, which may contribute to his current condition. No diabetes is reported.  Currently, he wears regular shoes, although he previously used a 'slide' type of shoe. He is not using an orthopedic boot.  He is a relatively poor historian, the attendant with him today is also unsure of much of his medical history and status      Objective:    Physical Exam VASCULAR: DP pulse palpable, PT pulse non-palpable. Foot is warm and well-perfused. Capillary fill time is brisk. DERMATOLOGIC: Full thickness ulcer on left foot with exposed subcutaneous tissue, hyperkeratotic rim, fibrogranular wound bed. No active drainage, cellulitis, purulence, or malodor. NEUROLOGIC: Normal sensation to light touch and pressure. No paresthesias on examination. ORTHOPEDIC: Smooth pain-free range of motion of all examined joints. No ecchymosis or bruising. No gross deformity. No pain to palpation.       Results Procedure: Wound debridement Description: Cleaned the wound and trimmed necrotic tissue from the full-thickness ulcer on the left foot. The ulcer had exposed subcutaneous tissue and a hyperkeratotic rim with a fibrogranular wound bed. No active drainage, cellulitis, purulence, or malodor was noted.  Postdebridement measures 1.0 x  1.2 x 0.3 cm   Assessment:   1. Ulcer of left foot with fat layer exposed (HCC)   2. PAD (peripheral artery disease) (HCC)      Plan:  Patient was evaluated and treated and all questions answered.  Assessment and Plan Assessment & Plan Chronic left foot ulcer Chronic full thickness ulcer on the left foot with exposed subcutaneous tissue and hyperkeratotic rim. The wound bed is fibrogranular with no active drainage, cellulitis, purulence, or malodor. The ulcer has been present for an extended period, with no significant clinical progression. There is a concern for potential underlying infection or other complications due to the chronicity of the wound. - Clean the wound and debride hyperkeratotic tissue. - Order CBC, ESR, and CRP. Lab work can be done at [facility] or the facility, with results faxed if done at the facility. - Recommend MRI of the foot to assess for infection or deep tissue involvement. - Conduct circulation testing at the hospital to evaluate for peripheral arterial disease. - Apply a pad to the shoe to offload pressure from the ulcer area. - Follow up in three to four weeks for reassessment.  Neuropathy Neuropathy with an unclear etiology, possibly related to peripheral arterial disease (PAD). - Refer for circulation testing to assess for PAD.  Information given for scheduling      Return in about 3 weeks (around 09/20/2023) for wound care.

## 2023-09-11 ENCOUNTER — Ambulatory Visit
Admission: RE | Admit: 2023-09-11 | Discharge: 2023-09-11 | Disposition: A | Discharge status: Home / Self Care | Source: Ambulatory Visit | Attending: Podiatry | Admitting: Podiatry

## 2023-09-11 ENCOUNTER — Telehealth: Payer: Self-pay

## 2023-09-11 DIAGNOSIS — L97522 Non-pressure chronic ulcer of other part of left foot with fat layer exposed: Secondary | ICD-10-CM

## 2023-09-11 NOTE — Telephone Encounter (Signed)
 Pt arrived and was unable to consent to MRI. Asked questions to patient to see if A/O, pt able to tell name and DOB but patient did not know where he was or what day or time it was. No POA or immediate family present to help go over screening form.. Patient also not ambulatory, this facility has no lift or equipment to help lift. Recommend that pt get a CT head, KUB and chest xray if there is no one available to clear patient and also recommend be done at a facility with a lift. Ordering Provider notified

## 2023-09-11 NOTE — Telephone Encounter (Signed)
 Attempted to contact guardian to obtain consent and provider information/authorization for MRI. No answer. No identified VM.  WENDI Dixon CMA

## 2023-09-19 ENCOUNTER — Ambulatory Visit
Admission: RE | Admit: 2023-09-19 | Discharge: 2023-09-19 | Disposition: A | Discharge status: Home / Self Care | Source: Ambulatory Visit | Attending: Podiatry | Admitting: Podiatry

## 2023-09-20 ENCOUNTER — Encounter: Payer: Self-pay | Admitting: Podiatry

## 2023-09-20 ENCOUNTER — Ambulatory Visit (INDEPENDENT_AMBULATORY_CARE_PROVIDER_SITE_OTHER): Admitting: Podiatry

## 2023-09-20 DIAGNOSIS — L97522 Non-pressure chronic ulcer of other part of left foot with fat layer exposed: Secondary | ICD-10-CM | POA: Diagnosis not present

## 2023-09-20 NOTE — Patient Instructions (Signed)
 Call to schedule wound care center follow up   [address], [place], [place] [address] Phone: [phone]

## 2023-09-20 NOTE — Progress Notes (Signed)
 Subjective:  Patient ID: [name] [name], male    DOB: 1961/01/16,  MRN: [id number]  Chief Complaint  Patient presents with   Wound Check   rm8    Rm8  wound check left foot/ some draining     Discussed the use of AI scribe software for clinical note transcription with the patient, who gave verbal consent to proceed.  History of Present Illness He returns for follow-up they have been bandaging and utilizing the antibiotic ointment      Objective:    Physical Exam VASCULAR: DP pulse palpable, PT pulse non-palpable. Foot is warm and well-perfused. Capillary fill time is brisk. DERMATOLOGIC: Full thickness ulcer on left foot with exposed subcutaneous tissue, hyperkeratotic rim, fibrogranular wound bed. No active drainage, cellulitis, purulence, or malodor. NEUROLOGIC: Normal sensation to light touch and pressure. No paresthesias on examination. ORTHOPEDIC: Smooth pain-free range of motion of all examined joints. No ecchymosis or bruising. No gross deformity. No pain to palpation.       Results Procedure: Wound debridement Description: Cleaned the wound and trimmed necrotic tissue from the full-thickness ulcer on the left foot. The ulcer had exposed subcutaneous tissue and a hyperkeratotic rim with a fibrogranular wound bed. No active drainage, cellulitis, purulence, or malodor was noted.  Postdebridement measures 1.0 x 0.8 x 0.3 cm    Study Result  Narrative & Impression  EXAM DESCRIPTION: MR TOES LEFT WO CONTRAST   CLINICAL HISTORY: Osteomyelitis, foot   COMPARISON: None Available.   TECHNIQUE: MRI of the foot is performed according to our usual protocol with multiplanar multi sequence imaging.   FINDINGS: No fracture. No erosions. Very mild marrow edema to the medial sesamoid of the first digit. The marrow signal is otherwise unremarkable. Severe muscle atrophy. Mild dorsal subcutaneous edema.   There is a superficial soft tissue wound/ulceration inferior to the  sesamoids and greater medially. Mild phlegmonous change. No drainable collection. The tendons are unremarkable.   IMPRESSION: Superficial soft tissue wound/ulceration inferior to the sesamoids with mild phlegmonous change. No drainable collection. There is a very mild marrow edema to the medial sesamoid which is likely degenerative and/or reactive rather than early osteomyelitis. Recommend close clinical follow-up and reimaging if warranted.   Severe muscle atrophy.   Mild subcutaneous edema.   Electronically signed by: Reyes Frees MD 09/19/2023 06:54 PM EDT RP Workstation: [id number]         Collected Updated Procedure   08/30/2023 1104 08/31/2023 0537 C-reactive protein [[id number]]   Blood   Component Value Units  CRP <1 mg/L       08/30/2023 1104 08/31/2023 0537 Sedimentation Rate [[id number]]   Blood   Component Value Units  Sed Rate 10 mm/hr       08/30/2023 1104 08/31/2023 0537 CBC with Differential [[id number]]   (Abnormal)  Blood   Component Value Units  WBC 8.2 x10E3/uL  RBC 5.03 x10E6/uL  Hemoglobin 16.8 g/dL  Hematocrit 50.9 %  MCV 97 fL  MCH 33.4 High  pg  MCHC 34.3 g/dL  RDW 87.9 %  Platelets 155 x10E3/uL  Neutrophils 29 %  Lymphs 57 %  Monocytes 12 %  Eos 1 %  Basos 1 %  Neutrophils Absolute 2.4 x10E3/uL  Lymphocytes Absolute 4.7 High  x10E3/uL  Monocytes Absolute 1.0 High  x10E3/uL  EOS (ABSOLUTE) 0.1 x10E3/uL  Basophils Absolute 0.0 x10E3/uL  Immature Granulocytes 0 %  Immature Grans (Abs) 0.0 x10E3/uL           Assessment:   1. Ulcer of left  foot with fat layer exposed (HCC)      Plan:  Patient was evaluated and treated and all questions answered.  Assessment and Plan Assessment & Plan Chronic left foot ulcer Chronic full thickness ulcer on the left foot with exposed subcutaneous tissue and hyperkeratotic rim.  Has some improvement.  I reviewed his lab results and MRI results with him.  Do not recommend any operative  debridement amputation or surgical intervention at this point.  Referral be placed for him to follow-up with the wound care center.  Otherwise continue local wound care for now.  Follow-up with us  as needed if infection develops or surgical intervention is necessary.      Return if symptoms worsen or fail to improve.

## 2023-10-04 ENCOUNTER — Telehealth: Payer: Self-pay | Admitting: Podiatry

## 2023-10-18 ENCOUNTER — Ambulatory Visit: Admitting: Physician Assistant

## 2023-11-03 ENCOUNTER — Telehealth: Payer: Self-pay | Admitting: Lab

## 2023-11-03 DIAGNOSIS — L97522 Non-pressure chronic ulcer of other part of left foot with fat layer exposed: Secondary | ICD-10-CM

## 2023-11-03 NOTE — Telephone Encounter (Signed)
 Dr.Michel reached out to our office in regards to patient in his care states is having recurrent infections to diabetic ulcer of left foot MRI negative for osteomyelitis patient continues to get infections and provider is in need of some direction on how to treat continuous antibiotics is a concern.Possibly TMA, wants to discuss treatment plan with provider here .Please call him anytime [phone] .

## 2023-11-06 NOTE — Telephone Encounter (Signed)
 Returned his call and discussed his treatment options wound has not had any meaningful progression he has been taking care of the wound since his last exam with me in early July.  He did complete the ABIs which were essentially normal and minimal PVD.  He would like to try total contact casting, I placed a new referral to the wound care center here at [facility], previously the referral was canceled because the facility wanted to keep his wound care in house but Dr. Ozell and I are in agreement that this would be the best way to proceed now.  If this fails he may return to see me for surgical options.  We discussed the option of TMA or excision of the sesamoid but we discussed at this point that likely would offer further risk of poor wound healing and/or more complications but ultimately may be our only option if it does not progress

## 2023-11-29 ENCOUNTER — Encounter: Attending: Physician Assistant | Admitting: Physician Assistant

## 2023-11-29 DIAGNOSIS — E11621 Type 2 diabetes mellitus with foot ulcer: Secondary | ICD-10-CM | POA: Diagnosis present

## 2023-11-29 DIAGNOSIS — I10 Essential (primary) hypertension: Secondary | ICD-10-CM | POA: Diagnosis not present

## 2023-11-29 DIAGNOSIS — F01A0 Vascular dementia, mild, without behavioral disturbance, psychotic disturbance, mood disturbance, and anxiety: Secondary | ICD-10-CM | POA: Insufficient documentation

## 2023-11-29 DIAGNOSIS — F1019 Alcohol abuse with unspecified alcohol-induced disorder: Secondary | ICD-10-CM | POA: Diagnosis not present

## 2023-11-29 DIAGNOSIS — L97512 Non-pressure chronic ulcer of other part of right foot with fat layer exposed: Secondary | ICD-10-CM | POA: Insufficient documentation

## 2023-11-29 DIAGNOSIS — G4089 Other seizures: Secondary | ICD-10-CM | POA: Diagnosis not present

## 2023-11-29 DIAGNOSIS — L97522 Non-pressure chronic ulcer of other part of left foot with fat layer exposed: Secondary | ICD-10-CM | POA: Insufficient documentation

## 2023-11-29 DIAGNOSIS — E1151 Type 2 diabetes mellitus with diabetic peripheral angiopathy without gangrene: Secondary | ICD-10-CM | POA: Diagnosis not present

## 2023-11-29 DIAGNOSIS — I2510 Atherosclerotic heart disease of native coronary artery without angina pectoris: Secondary | ICD-10-CM | POA: Diagnosis not present

## 2023-12-13 ENCOUNTER — Encounter

## 2023-12-14 ENCOUNTER — Ambulatory Visit: Admitting: Physician Assistant

## 2023-12-14 ENCOUNTER — Encounter

## 2023-12-15 ENCOUNTER — Ambulatory Visit (HOSPITAL_COMMUNITY)
Admission: RE | Admit: 2023-12-15 | Discharge: 2023-12-15 | Disposition: A | Discharge status: Home / Self Care | Source: Ambulatory Visit | Attending: Podiatry | Admitting: Podiatry

## 2023-12-15 DIAGNOSIS — I739 Peripheral vascular disease, unspecified: Secondary | ICD-10-CM | POA: Diagnosis present

## 2023-12-15 LAB — VAS US ABI WITH/WO TBI
Left ABI: 1.19
Right ABI: 1.1
